# Patient Record
Sex: Male | Born: 1945 | Race: Black or African American | Hispanic: No | Smoking: Current every day smoker
Health system: Southern US, Community
[De-identification: ages and names within clinical notes are randomized; demographics above are authoritative.]

## PROBLEM LIST (undated history)

## (undated) DIAGNOSIS — N3289 Other specified disorders of bladder: Secondary | ICD-10-CM

## (undated) DIAGNOSIS — I639 Cerebral infarction, unspecified: Secondary | ICD-10-CM

## (undated) DIAGNOSIS — I4510 Unspecified right bundle-branch block: Secondary | ICD-10-CM

## (undated) DIAGNOSIS — J449 Chronic obstructive pulmonary disease, unspecified: Secondary | ICD-10-CM

## (undated) DIAGNOSIS — E0520 Thyrotoxicosis with toxic multinodular goiter without thyrotoxic crisis or storm: Secondary | ICD-10-CM

## (undated) DIAGNOSIS — C689 Malignant neoplasm of urinary organ, unspecified: Secondary | ICD-10-CM

## (undated) DIAGNOSIS — I10 Essential (primary) hypertension: Secondary | ICD-10-CM

## (undated) DIAGNOSIS — F0390 Unspecified dementia without behavioral disturbance: Secondary | ICD-10-CM

---

## 2000-07-19 ENCOUNTER — Encounter: Payer: Self-pay | Admitting: Emergency Medicine

## 2000-07-19 ENCOUNTER — Inpatient Hospital Stay (HOSPITAL_COMMUNITY): Admission: EM | Admit: 2000-07-19 | Discharge: 2000-08-05 | Payer: Self-pay | Admitting: Emergency Medicine

## 2000-07-21 ENCOUNTER — Encounter: Payer: Self-pay | Admitting: Orthopedic Surgery

## 2000-10-02 ENCOUNTER — Ambulatory Visit (HOSPITAL_COMMUNITY): Admission: RE | Admit: 2000-10-02 | Discharge: 2000-10-02 | Payer: Self-pay | Admitting: Family Medicine

## 2000-10-02 ENCOUNTER — Encounter: Payer: Self-pay | Admitting: Family Medicine

## 2004-06-06 ENCOUNTER — Ambulatory Visit: Payer: Self-pay | Admitting: Family Medicine

## 2004-06-06 ENCOUNTER — Ambulatory Visit (HOSPITAL_COMMUNITY): Admission: RE | Admit: 2004-06-06 | Discharge: 2004-06-06 | Payer: Self-pay | Admitting: Family Medicine

## 2004-06-13 ENCOUNTER — Ambulatory Visit (HOSPITAL_COMMUNITY): Admission: RE | Admit: 2004-06-13 | Discharge: 2004-06-13 | Payer: Self-pay | Admitting: Family Medicine

## 2004-06-19 ENCOUNTER — Ambulatory Visit (HOSPITAL_COMMUNITY): Admission: RE | Admit: 2004-06-19 | Discharge: 2004-06-19 | Payer: Self-pay | Admitting: Family Medicine

## 2004-09-06 ENCOUNTER — Ambulatory Visit: Payer: Self-pay | Admitting: Family Medicine

## 2004-09-28 ENCOUNTER — Encounter (INDEPENDENT_AMBULATORY_CARE_PROVIDER_SITE_OTHER): Payer: Self-pay | Admitting: General Surgery

## 2004-09-28 ENCOUNTER — Ambulatory Visit (HOSPITAL_COMMUNITY): Admission: RE | Admit: 2004-09-28 | Discharge: 2004-09-28 | Payer: Self-pay | Admitting: General Surgery

## 2004-10-25 ENCOUNTER — Encounter (INDEPENDENT_AMBULATORY_CARE_PROVIDER_SITE_OTHER): Payer: Self-pay | Admitting: General Surgery

## 2004-10-26 ENCOUNTER — Ambulatory Visit (HOSPITAL_COMMUNITY): Admission: RE | Admit: 2004-10-26 | Discharge: 2004-10-26 | Payer: Self-pay | Admitting: General Surgery

## 2004-11-06 ENCOUNTER — Ambulatory Visit: Payer: Self-pay | Admitting: Family Medicine

## 2004-12-13 ENCOUNTER — Ambulatory Visit: Payer: Self-pay | Admitting: Family Medicine

## 2005-04-13 ENCOUNTER — Ambulatory Visit: Payer: Self-pay | Admitting: Family Medicine

## 2005-08-21 ENCOUNTER — Ambulatory Visit: Payer: Self-pay | Admitting: Family Medicine

## 2006-01-14 ENCOUNTER — Ambulatory Visit: Payer: Self-pay | Admitting: Family Medicine

## 2006-05-23 ENCOUNTER — Ambulatory Visit: Payer: Self-pay | Admitting: Family Medicine

## 2006-08-22 ENCOUNTER — Encounter (INDEPENDENT_AMBULATORY_CARE_PROVIDER_SITE_OTHER): Payer: Self-pay | Admitting: *Deleted

## 2006-08-22 ENCOUNTER — Encounter: Payer: Self-pay | Admitting: Family Medicine

## 2006-08-22 LAB — CONVERTED CEMR LAB
BUN: 18 mg/dL (ref 6–23)
Basophils Absolute: 0 10*3/uL (ref 0.0–0.1)
Basophils Relative: 1 % (ref 0–1)
CO2: 24 meq/L (ref 19–32)
Chloride: 99 meq/L (ref 96–112)
Cholesterol: 178 mg/dL (ref 0–200)
Creatinine, Ser: 1.11 mg/dL (ref 0.40–1.50)
Glucose, Bld: 86 mg/dL (ref 70–99)
Monocytes Absolute: 0.7 10*3/uL (ref 0.2–0.7)
RBC: 6.73 M/uL — ABNORMAL HIGH (ref 4.22–5.81)
RDW: 16 % — ABNORMAL HIGH (ref 11.5–14.0)
Triglycerides: 181 mg/dL — ABNORMAL HIGH (ref <150)

## 2006-08-26 ENCOUNTER — Ambulatory Visit: Payer: Self-pay | Admitting: Family Medicine

## 2006-08-27 ENCOUNTER — Encounter: Payer: Self-pay | Admitting: Family Medicine

## 2006-08-30 ENCOUNTER — Ambulatory Visit (HOSPITAL_COMMUNITY): Admission: RE | Admit: 2006-08-30 | Discharge: 2006-08-30 | Payer: Self-pay | Admitting: Pediatrics

## 2006-09-09 ENCOUNTER — Ambulatory Visit: Payer: Self-pay | Admitting: Family Medicine

## 2006-09-16 ENCOUNTER — Ambulatory Visit (HOSPITAL_COMMUNITY): Admission: RE | Admit: 2006-09-16 | Discharge: 2006-09-16 | Payer: Self-pay | Admitting: General Surgery

## 2006-09-16 ENCOUNTER — Ambulatory Visit (HOSPITAL_COMMUNITY): Admission: RE | Admit: 2006-09-16 | Discharge: 2006-09-16 | Payer: Self-pay | Admitting: Family Medicine

## 2006-09-30 ENCOUNTER — Ambulatory Visit (HOSPITAL_COMMUNITY): Admission: RE | Admit: 2006-09-30 | Discharge: 2006-09-30 | Payer: Self-pay | Admitting: Family Medicine

## 2006-10-25 ENCOUNTER — Encounter (INDEPENDENT_AMBULATORY_CARE_PROVIDER_SITE_OTHER): Payer: Self-pay | Admitting: *Deleted

## 2006-10-25 DIAGNOSIS — I10 Essential (primary) hypertension: Secondary | ICD-10-CM | POA: Insufficient documentation

## 2006-10-25 DIAGNOSIS — Z860100 Personal history of colon polyps, unspecified: Secondary | ICD-10-CM | POA: Insufficient documentation

## 2006-10-25 DIAGNOSIS — E049 Nontoxic goiter, unspecified: Secondary | ICD-10-CM | POA: Insufficient documentation

## 2006-10-25 DIAGNOSIS — F17200 Nicotine dependence, unspecified, uncomplicated: Secondary | ICD-10-CM | POA: Insufficient documentation

## 2006-10-25 DIAGNOSIS — Z87898 Personal history of other specified conditions: Secondary | ICD-10-CM

## 2006-10-31 ENCOUNTER — Ambulatory Visit: Payer: Self-pay | Admitting: Family Medicine

## 2007-01-02 ENCOUNTER — Encounter: Payer: Self-pay | Admitting: Family Medicine

## 2007-02-04 ENCOUNTER — Ambulatory Visit: Payer: Self-pay | Admitting: Family Medicine

## 2007-02-05 ENCOUNTER — Encounter: Payer: Self-pay | Admitting: Family Medicine

## 2007-02-05 LAB — CONVERTED CEMR LAB: T3, Free: 3 pg/mL (ref 2.3–4.2)

## 2007-06-04 ENCOUNTER — Ambulatory Visit: Payer: Self-pay | Admitting: Family Medicine

## 2007-06-04 LAB — CONVERTED CEMR LAB
BUN: 27 mg/dL — ABNORMAL HIGH (ref 6–23)
CO2: 22 meq/L (ref 19–32)
Calcium: 9.8 mg/dL (ref 8.4–10.5)
Creatinine, Ser: 1.3 mg/dL (ref 0.40–1.50)
Free T4: 1.11 ng/dL (ref 0.89–1.80)
Glucose, Bld: 82 mg/dL (ref 70–99)
Potassium: 4.2 meq/L (ref 3.5–5.3)
Sodium: 138 meq/L (ref 135–145)
T3, Free: 2.5 pg/mL (ref 2.3–4.2)
TSH: 0.421 microintl units/mL (ref 0.350–5.50)

## 2007-07-23 ENCOUNTER — Ambulatory Visit: Payer: Self-pay | Admitting: Family Medicine

## 2007-09-30 ENCOUNTER — Ambulatory Visit: Payer: Self-pay | Admitting: Family Medicine

## 2008-01-29 ENCOUNTER — Ambulatory Visit: Payer: Self-pay | Admitting: Family Medicine

## 2008-01-30 DIAGNOSIS — F1010 Alcohol abuse, uncomplicated: Secondary | ICD-10-CM | POA: Insufficient documentation

## 2008-04-20 ENCOUNTER — Encounter: Payer: Self-pay | Admitting: Family Medicine

## 2008-04-20 LAB — CONVERTED CEMR LAB
Calcium: 9.4 mg/dL (ref 8.4–10.5)
Cholesterol: 117 mg/dL (ref 0–200)
Eosinophils Absolute: 0 10*3/uL (ref 0.0–0.7)
HDL: 35 mg/dL — ABNORMAL LOW (ref >39)
LDL Cholesterol: 61 mg/dL (ref 0–99)
Monocytes Relative: 9 % (ref 3–12)
Neutro Abs: 3.3 10*3/uL (ref 1.7–7.7)
Neutrophils Relative %: 58 % (ref 43–77)
Platelets: 175 10*3/uL (ref 150–400)
RDW: 16.1 % — ABNORMAL HIGH (ref 11.5–15.5)
TSH: 0.444 microintl units/mL (ref 0.350–4.500)
Total CHOL/HDL Ratio: 3.3
VLDL: 21 mg/dL (ref 0–40)

## 2008-08-14 ENCOUNTER — Emergency Department (HOSPITAL_COMMUNITY): Admission: EM | Admit: 2008-08-14 | Discharge: 2008-08-14 | Payer: Self-pay | Admitting: Emergency Medicine

## 2008-11-08 ENCOUNTER — Ambulatory Visit: Payer: Self-pay | Admitting: Family Medicine

## 2008-12-07 ENCOUNTER — Ambulatory Visit: Payer: Self-pay | Admitting: Family Medicine

## 2008-12-09 ENCOUNTER — Encounter: Payer: Self-pay | Admitting: Family Medicine

## 2008-12-14 ENCOUNTER — Encounter: Payer: Self-pay | Admitting: Family Medicine

## 2009-03-15 ENCOUNTER — Ambulatory Visit: Payer: Self-pay | Admitting: Family Medicine

## 2009-03-15 DIAGNOSIS — R5383 Other fatigue: Secondary | ICD-10-CM

## 2009-03-15 DIAGNOSIS — R5381 Other malaise: Secondary | ICD-10-CM

## 2009-03-15 LAB — CONVERTED CEMR LAB
Cholesterol, target level: 200 mg/dL
LDL Goal: 100 mg/dL

## 2009-03-21 ENCOUNTER — Emergency Department (HOSPITAL_COMMUNITY): Admission: EM | Admit: 2009-03-21 | Discharge: 2009-03-21 | Payer: Self-pay | Admitting: Emergency Medicine

## 2009-03-23 ENCOUNTER — Ambulatory Visit: Payer: Self-pay | Admitting: Family Medicine

## 2009-03-23 DIAGNOSIS — L02519 Cutaneous abscess of unspecified hand: Secondary | ICD-10-CM

## 2009-03-23 DIAGNOSIS — L03119 Cellulitis of unspecified part of limb: Secondary | ICD-10-CM

## 2009-03-27 DIAGNOSIS — R413 Other amnesia: Secondary | ICD-10-CM

## 2009-05-23 ENCOUNTER — Ambulatory Visit: Payer: Self-pay | Admitting: Family Medicine

## 2009-05-23 LAB — CONVERTED CEMR LAB
Basophils Absolute: 0 10*3/uL (ref 0.0–0.1)
Chloride: 103 meq/L (ref 96–112)
Cholesterol: 151 mg/dL (ref 0–200)
Creatinine, Ser: 1.04 mg/dL (ref 0.40–1.50)
Eosinophils Absolute: 0.1 10*3/uL (ref 0.0–0.7)
Glucose, Bld: 118 mg/dL — ABNORMAL HIGH (ref 70–99)
Lymphocytes Relative: 43 % (ref 12–46)
Lymphs Abs: 1.8 10*3/uL (ref 0.7–4.0)
Monocytes Absolute: 0.4 10*3/uL (ref 0.1–1.0)
Monocytes Relative: 9 % (ref 3–12)
Neutrophils Relative %: 47 % (ref 43–77)
OCCULT 1: NEGATIVE
PSA: 2.03 ng/mL (ref 0.10–4.00)
Potassium: 4.1 meq/L (ref 3.5–5.3)
RBC: 6.77 M/uL — ABNORMAL HIGH (ref 4.22–5.81)
Sodium: 139 meq/L (ref 135–145)
Triglycerides: 80 mg/dL (ref <150)

## 2009-05-26 ENCOUNTER — Ambulatory Visit (HOSPITAL_COMMUNITY): Admission: RE | Admit: 2009-05-26 | Discharge: 2009-05-26 | Payer: Self-pay | Admitting: Family Medicine

## 2009-06-17 ENCOUNTER — Encounter: Payer: Self-pay | Admitting: Family Medicine

## 2009-08-05 ENCOUNTER — Encounter: Admission: RE | Admit: 2009-08-05 | Discharge: 2009-08-05 | Payer: Self-pay | Admitting: Family Medicine

## 2010-01-31 NOTE — Letter (Signed)
Summary: medical release  medical release   Imported By: Lind Guest 06/24/2009 13:54:01  _____________________________________________________________________  External Attachment:    Type:   Image     Comment:   External Document

## 2010-01-31 NOTE — Letter (Signed)
Summary: X  RAYS  X  RAYS   Imported By: Lind Guest 06/21/2009 14:15:12  _____________________________________________________________________  External Attachment:    Type:   Image     Comment:   External Document

## 2010-01-31 NOTE — Letter (Signed)
Summary: DEMO  DEMO   Imported By: Lind Guest 06/21/2009 14:19:37  _____________________________________________________________________  External Attachment:    Type:   Image     Comment:   External Document

## 2010-01-31 NOTE — Letter (Signed)
Summary: PHONE NOTES  PHONE NOTES   Imported By: Lind Guest 06/21/2009 14:13:23  _____________________________________________________________________  External Attachment:    Type:   Image     Comment:   External Document

## 2010-01-31 NOTE — Letter (Signed)
Summary: LABS  LABS   Imported By: Lind Guest 06/21/2009 14:14:08  _____________________________________________________________________  External Attachment:    Type:   Image     Comment:   External Document

## 2010-01-31 NOTE — Letter (Signed)
Summary: MISC  MISC   Imported By: Lind Guest 06/21/2009 14:12:14  _____________________________________________________________________  External Attachment:    Type:   Image     Comment:   External Document

## 2010-01-31 NOTE — Assessment & Plan Note (Signed)
Summary: er follow up- room 1   Vital Signs:  Patient profile:   65 year old male Height:      71 inches Weight:      166.50 pounds BMI:     23.31 O2 Sat:      95 % on Room air Pulse rate:   71 / minute Resp:     16 per minute BP sitting:   126 / 80  (left arm)  Vitals Entered By: Adella Hare LPN (March 23, 2009 1:20 PM) CC: er follow up, osteomylitis Is Patient Diabetic? No Pain Assessment Patient in pain? no        CC:  er follow up and osteomylitis.  History of Present Illness: Pt is here today with his wife for a check up. He was in the ER Mon 3/21 for an infection in his Lt 5th finger.  Question of osteomyelitis.  Had Consult in ER with Dr Hilda Lias who didn't think that it was.  Was advised to have a f/u with Dr Hilda Lias next wk but his wife brought him here today.  Hasn't sched appt with Dr Hilda Lias yet. Per wife swelling is much better.  Monday his entire finger was swollen. No fever or chills.  Pt is also overdue for f/u of his chronic med probs. Wife states she has a lab slip/order to get his blood drawn.  Will do this next mos. No refills needed on meds at this time Is still drinking & smoking.   Current Medications (verified): 1)  Benazepril Hcl 10 Mg Tabs (Benazepril Hcl) .... Take 1 Tablet By Mouth Once A Day  Allergies (verified): No Known Drug Allergies  Past History:  Past medical, surgical, family and social histories (including risk factors) reviewed for relevance to current acute and chronic problems.  Past Medical History: Reviewed history from 12/10/2006 and no changes required. Current Problems:  ? of THYROID NODULE (ICD-241.0) GOITER (ICD-240.9) COLONIC POLYPS, HX OF (ICD-V12.72) BENIGN PROSTATIC HYPERTROPHY, HX OF (ICD-V13.8) NICOTINE ADDICTION (ICD-305.1) HYPERTENSION (ICD-401.9) ALCOHOL DEPENDENCE     Past Surgical History: Reviewed history from 12/10/2006 and no changes required. Denies surgical history  Family  History: Reviewed history from 12/10/2006 and no changes required. NO CHILDREN MOTHER DECEASED HEART ATTACK FATHER LIVING  HYPERTENSIVE ONE SISTER LIVING  HYPERTENSIVE THREE BROTHERS  DECEASED CAUSE UNKNOWN THREE BROTHERS LIVING  HEALTH UNKNOWN  Social History: Reviewed history from 12/10/2006 and no changes required. DISABLED Single Current Smoker Alcohol use-yes Drug use-no  Review of Systems General:  Denies chills and fever. CV:  Denies chest pain or discomfort and palpitations. Resp:  Denies cough and shortness of breath. GI:  Denies abdominal pain and change in bowel habits.  Physical Exam  General:  Well-developed,well-nourished,in no acute distress; alert,appropriate and cooperative throughout examination Head:  Normocephalic and atraumatic without obvious abnormalities. No apparent alopecia or balding. Ears:  External ear exam shows no significant lesions or deformities.  Otoscopic examination reveals clear canals, tympanic membranes are intact bilaterally without bulging, retraction, inflammation or discharge. Hearing is grossly normal bilaterally. Nose:  External nasal examination shows no deformity or inflammation. Nasal mucosa are pink and moist without lesions or exudates. Mouth:  Oral mucosa and oropharynx without lesions or exudates.  Teeth in good repair. Neck:  No deformities, masses, or tenderness noted. Lungs:  Normal respiratory effort, chest expands symmetrically. Lungs are clear to auscultation, no crackles or wheezes. Heart:  Normal rate and regular rhythm. S1 and S2 normal without gallop, murmur, click, rub or other  extra sounds. Msk:  L5th digit, swelling of distal phalanx.  No erythema Pulses:  L radial normal.  L radial normal.     Impression & Recommendations:  Problem # 1:  CELLULITIS, HAND, LEFT (ICD-682.4) Assessment Improved Continue current meds. Will sched appt for pt with Dr Hilda Lias, per ER dischg instructions. Orders: Orthopedic  Referral (Ortho)  Problem # 2:  HYPERTENSION (ICD-401.9) Assessment: Unchanged  His updated medication list for this problem includes:    Benazepril Hcl 10 Mg Tabs (Benazepril hcl) .Marland Kitchen... Take 1 tablet by mouth once a day  BP today: 126/80 Prior BP: 133/88 (11/08/2008)  Labs Reviewed: K+: 4.1 (04/20/2008) Creat: : 1.08 (04/20/2008)   Chol: 117 (04/20/2008)   HDL: 35 (04/20/2008)   LDL: 61 (04/20/2008)   TG: 107 (04/20/2008)  His updated medication list for this problem includes:    Benazepril Hcl 10 Mg Tabs (Benazepril hcl) .Marland Kitchen... Take 1 tablet by mouth once a day  Problem # 3:  ALCOHOL ABUSE (ICD-305.00) Assessment: Unchanged  Problem # 4:  NICOTINE ADDICTION (ICD-305.1) Assessment: Unchanged  Complete Medication List: 1)  Benazepril Hcl 10 Mg Tabs (Benazepril hcl) .... Take 1 tablet by mouth once a day  Patient Instructions: 1)  Please schedule a follow-up appointment in 3 months. 2)  Tobacco is very bad for your health and your loved ones! You Should stop smoking!. 3)  Stop Smoking Tips: Choose a Quit date. Cut down before the Quit date. decide what you will do as a substitute when you feel the urge to smoke(gum,toothpick,exercise). 4)  It is not healthy  for men to drink more than 2-3 drinks per day or for women to drink more than 1-2 drinks per day. 5)  We will schedule a follow up appt with Dr Hilda Lias next week for you.

## 2010-01-31 NOTE — Letter (Signed)
Summary: HISTORY AND PHYSICAL  HISTORY AND PHYSICAL   Imported By: Lind Guest 06/21/2009 14:17:06  _____________________________________________________________________  External Attachment:    Type:   Image     Comment:   External Document

## 2010-01-31 NOTE — Assessment & Plan Note (Signed)
Summary: FOLLOW UP   Vital Signs:  Patient profile:   65 year old male Height:      71 inches Weight:      163.25 pounds BMI:     22.85 O2 Sat:      97 % Pulse rate:   70 / minute Pulse rhythm:   regular Resp:     16 per minute BP sitting:   142 / 92  (left arm) Cuff size:   regular  Vitals Entered By: Everitt Amber LPN (March 15, 2009 11:08 AM) CC: Follow up chronic problems, Lipid Management   CC:  Follow up chronic problems and Lipid Management.  History of Present Illness: Reports  that he has been doing fairly well. He continues to drink alcohol, regularly, and allegedly also uses marijuana on a regular basis. He had recently gone to an assisted living facility, but hasreturned to live with his sister, who states she is unable to manage him and would appreciate help. The pt is anextremely poor historian, and perfformed extremely poorly on the minicog test, score of zero. Alcohol abuse is the most likely contributor to this. Denies recent fever or chills. Denies sinus pressure, nasal congestion , ear pain or sore throat. Denies chest congestion, or cough productive of sputum. Denies chest pain, palpitations, PND, orthopnea or leg swelling. Denies abdominal pain, nausea, vomitting, diarrhea or constipation.  Denies  joint pain, swelling, or reduced mobility. Denies headaches, vertigo, seizures. Denies depression, anxiety or insomnia. Denies  rash, lesions, or itch.     Lipid Management History:      Positive NCEP/ATP III risk factors include male age 8 years old or older, HDL cholesterol less than 40, current tobacco user, and hypertension.    Preventive Screening-Counseling & Management  Alcohol-Tobacco     Smoking Cessation Counseling: yes  Current Medications (verified): 1)  None  Allergies (verified): No Known Drug Allergies  Past History:  Past medical, surgical, family and social histories (including risk factors) reviewed, and no changes noted (except  as noted below).  Past Medical History: Current Problems:  ? of THYROID NODULE (ICD-241.0) GOITER (ICD-240.9) COLONIC POLYPS, HX OF (ICD-V12.72) BENIGN PROSTATIC HYPERTROPHY, HX OF (ICD-V13.8) NICOTINE ADDICTION (ICD-305.1) HYPERTENSION (ICD-401.9) ALCOHOL DEPENDENCE memory disorder     Past Surgical History: Reviewed history from 12/10/2006 and no changes required. Denies surgical history  Family History: Reviewed history from 12/10/2006 and no changes required. NO CHILDREN MOTHER DECEASED HEART ATTACK FATHERdeceased at at age 42 ONE SISTER LIVING  HYPERTENSIVE THREE BROTHERS  DECEASED CAUSE UNKNOWN two BROTHERS LIVING  HEALTH UNKNOWN  Social History: Reviewed history from 12/10/2006 and no changes required. DISABLED Single Current Smoker Alcohol use-yes Drug use-no  Review of Systems      See HPI Eyes:  Denies blurring and discharge. Endo:  Denies excessive thirst and excessive urination. Heme:  Denies abnormal bruising and bleeding. Allergy:  Complains of seasonal allergies.  Physical Exam  General:  Underweight appearing,in no acute distress; alert,appropriate and cooperative throughout examination HEENT: No facial asymmetry,  EOMI, No sinus tenderness, TM's Clear, oropharynx  pink and moist. Poor dentition.  Chest: Clear to auscultation bilaterally. decreased air entry throughout CVS: S1, S2, No murmurs, No S3.   Abd: Soft, Nontender.  MS: Adequate ROM spine, hips, shoulders and knees.  Ext: No edema.   CNS: CN 2-12 intact, power tone and sensation normal throughout.   Skin: Intact, no visible lesions or rashes.  Psych: Good eye contact, normal affect.  Memory loss, not anxious  or depressed appearing.    Impression & Recommendations:  Problem # 1:  ALCOHOL ABUSE (ICD-305.00) Assessment Unchanged  Problem # 2:  NICOTINE ADDICTION (ICD-305.1) Assessment: Unchanged  Encouraged smoking cessation and discussed different methods for smoking cessation.    Problem # 3:  HYPERTENSION (ICD-401.9) Assessment: Deteriorated  The following medications were removed from the medication list:    Hydrochlorothiazide 25 Mg Tabs (Hydrochlorothiazide) .Marland Kitchen... Take 1 tablet by mouth once a day His updated medication list for this problem includes:    Benazepril Hcl 10 Mg Tabs (Benazepril hcl) .Marland Kitchen... Take 1 tablet by mouth once a day  Orders: T-Basic Metabolic Panel [phone])  BP today: 142/92 Prior BP: 133/88 (11/08/2008)  Labs Reviewed: K+: 4.1 (04/20/2008) Creat: : 1.08 (04/20/2008)   Chol: 117 (04/20/2008)   HDL: 35 (04/20/2008)   LDL: 61 (04/20/2008)   TG: 107 (04/20/2008)  Problem # 4:  MEMORY LOSS (ICD-780.93) Assessment: Comment Only ill refer for caregiver assistance through state program  Complete Medication List: 1)  Benazepril Hcl 10 Mg Tabs (Benazepril hcl) .... Take 1 tablet by mouth once a day  Other Orders: T-Lipid Profile ([id number]) T-TSH [phone]) T-CBC w/Diff [id number]) T-PSA [phone]) Pneumococcal Vaccine (02725) Admin 1st Vaccine (36644) Admin 1st Vaccine [facility]) [phone])  Lipid Assessment/Plan:      Based on NCEP/ATP III, the patient's risk factor category is "2 or more risk factors and a calculated 10 year CAD risk of > 20%".  The patient's lipid goals are as follows: Total cholesterol goal is 200; LDL cholesterol goal is 100; HDL cholesterol goal is 40; Triglyceride goal is 150.     Patient Instructions: 1)  F/U mid May. 2)  Tobacco is very bad for your health and your loved ones! You Should stop smoking!. 3)  Stop Smoking Tips: Choose a Quit date. Cut down before the Quit date. decide what you will do as a substitute when you feel the urge to smoke(gum,toothpick,exercise). 4)  It is not healthy  for men to drink more than 2-3 drinks per day or for women to drink more than 1-2 drinks per day. 5)  BMP prior to visit, ICD-9: 6)  Lipid Panel prior to visit, ICD-9: 7)  TSH prior to visit, ICD-9:   fasting  end APRIL 8)  CBC w/ Diff prior to visit, ICD-9: 9)  PSA prior to visit, ICD-9: 10)  You need to start blood pressure meds again Prescriptions: BENAZEPRIL HCL 10 MG TABS (BENAZEPRIL HCL) Take 1 tablet by mouth once a day  #30 x 2   Entered and Authorized by:   Syliva Overman MD   Signed by:   Syliva Overman MD on 03/15/2009   Method used:   [facility] to        [facility] [phone]* (retail)       [address]       [place], [place]  [address]       Ph: [phone] or [phone]       Fax: [phone]   RxID:   [id number]     Pneumovax    Vaccine Type: Pneumovax    Site: left deltoid    Mfr: Merck    Dose: 0.5 ml    Route: IM    Given by: Everitt Amber LPN    Exp. Date: 12/11    Lot #: [id number]

## 2010-01-31 NOTE — Assessment & Plan Note (Signed)
Summary: office visit   Vital Signs:  Patient profile:   65 year old male Height:      71 inches Weight:      160.25 pounds BMI:     22.43 O2 Sat:      97 % Pulse rate:   78 / minute Pulse rhythm:   regular Resp:     16 per minute BP sitting:   130 / 84  (left arm) Cuff size:   regular  Vitals Entered By: Everitt Amber LPN (May 23, 2009 2:43 PM) CC: Follow up chronic problems   CC:  Follow up chronic problems.  History of Present Illness: Smokes 1 pack ciggs per week, states he does not use pot, reports less alcohol use. The sister who alleges differently averts her head and eyes and is silent despite promopting to verify his history, which I seriously doubt. Apart from inc allergy symptoms of itchy watery eyes, he state he feels well and has no concerns.   Preventive Screening-Counseling & Management  Alcohol-Tobacco     Smoking Cessation Counseling: yes  Current Medications (verified): 1)  Benazepril Hcl 10 Mg Tabs (Benazepril Hcl) .... Take 1 Tablet By Mouth Once A Day  Allergies (verified): No Known Drug Allergies  Review of Systems      See HPI General:  Complains of fatigue; denies chills and fever. Eyes:  Complains of itching; denies discharge and eye pain. ENT:  Complains of postnasal drainage; denies hoarseness, nasal congestion, sinus pressure, and sore throat. CV:  Denies chest pain or discomfort, difficulty breathing while lying down, lightheadness, palpitations, and swelling of feet. Resp:  Complains of cough and shortness of breath; denies sputum productive and wheezing; chronic smokers cough , unchanged. GI:  Denies abdominal pain, constipation, diarrhea, nausea, and vomiting. GU:  Complains of erectile dysfunction; denies dysuria and urinary frequency. MS:  Denies joint pain, low back pain, mid back pain, muscle weakness, and stiffness. Derm:  Denies itching and rash. Neuro:  Complains of difficulty with concentration and memory loss; denies headaches,  seizures, and sensation of room spinning. Psych:  Denies anxiety and depression. Endo:  Denies cold intolerance, excessive hunger, excessive thirst, excessive urination, heat intolerance, polyuria, and weight change. Heme:  Denies abnormal bruising and bleeding. Allergy:  Complains of itching eyes and seasonal allergies; increased symptoms x 1 month.  Physical Exam  General:  Well-developed,well-nourished,in no acute distress; alert,appropriate and cooperative throughout examination HEENT: No facial asymmetry, goiter. EOMI, No sinus tenderness, TM's Clear, oropharynx  pink and moist.   Chest: Clear to auscultation bilaterally. Decreased air entry throughout. CVS: S1, S2, No murmurs, No S3.   Abd: Soft, Nontender.  MS: Adequate ROM spine, hips, shoulders and knees.  Ext: No edema.   CNS: CN 2-12 intact, power tone and sensation normal throughout.   Skin: Intact, no visible lesions or rashes.  Psych: Good eye contact, normal affect.  Memory loss not anxious or depressed appearing.    Impression & Recommendations:  Problem # 1:  MEMORY LOSS (ICD-780.93) Assessment Unchanged  Problem # 2:  GOITER (ICD-240.9) Assessment: Comment Only  Orders: Radiology Referral (Radiology)  Problem # 3:  HYPERTENSION (ICD-401.9) Assessment: Unchanged  His updated medication list for this problem includes:    Benazepril Hcl 10 Mg Tabs (Benazepril hcl) .Marland Kitchen... Take 1 tablet by mouth once a day  BP today: 130/84 Prior BP: 126/80 (03/23/2009)  Prior 10 Yr Risk Heart Disease: 22 % (03/15/2009)  Labs Reviewed: K+: 4.1 (05/20/2009) Creat: : 1.04 (05/20/2009)  Chol: 151 (05/20/2009)   HDL: 43 (05/20/2009)   LDL: 92 (05/20/2009)   TG: 80 (05/20/2009)  Problem # 4:  NICOTINE ADDICTION (ICD-305.1) Assessment: Unchanged  Encouraged smoking cessation and discussed different methods for smoking cessation.   Problem # 5:  ALCOHOL ABUSE (ICD-305.00) Assessment: Unchanged  Complete Medication  List: 1)  Benazepril Hcl 10 Mg Tabs (Benazepril hcl) .... Take 1 tablet by mouth once a day 2)  Cromolyn Sodium 4 % Soln (Cromolyn sodium) .... One drop to each eye 3 times daily as needed  Other Orders: Hemoccult Guaiac-1 spec.(in office) ([address])  Patient Instructions: 1)  Please schedule a follow-up appointment in 3 months. 2)  Tobacco is very bad for your health and your loved ones! You Should stop smoking!. 3)  Stop Smoking Tips: Choose a Quit date. Cut down before the Quit date. decide what you will do as a substitute when you feel the urge to smoke(gum,toothpick,exercise). 4)  It is not healthy  for men to drink more than 2-3 drinks per day or for women to drink more than 1-2 drinks per day. 5)  You are being referred for tests on your thyroid gland. 6)  New med is sent in for your itchy watery eyes. Prescriptions: BENAZEPRIL HCL 10 MG TABS (BENAZEPRIL HCL) Take 1 tablet by mouth once a day  #30 x 5   Entered by:   Everitt Amber LPN   Authorized by:   Syliva Overman MD   Signed by:   Everitt Amber LPN on 60/45/4098   Method used:   [facility] to        [facility] [phone]* (retail)       [address]       [place], [place]  [address]       Ph: [phone] or [phone]       Fax: [phone]   RxID:   [id number] CROMOLYN SODIUM 4 % SOLN (CROMOLYN SODIUM) one drop to each eye 3 times daily as needed  #7.80ml x 1   Entered and Authorized by:   Syliva Overman MD   Signed by:   Syliva Overman MD on 05/23/2009   Method used:   [facility]ically to        [facility] [phone]* (retail)       [address]       [place], [place]  [address]       Ph: [phone] or [phone]       Fax: [phone]   RxID:   [id number]   Laboratory Results    Stool - Occult Blood Hemmoccult #1: negative Date: 05/23/2009 Comments: [id number] 9R 8/11 118 10/12

## 2010-01-31 NOTE — Letter (Signed)
Summary: CONSULTS  CONSULTS   Imported By: Lind Guest 06/21/2009 14:13:46  _____________________________________________________________________  External Attachment:    Type:   Image     Comment:   External Document

## 2010-01-31 NOTE — Letter (Signed)
Summary: OFFICE NOTES  OFFICE NOTES   Imported By: Lind Guest 06/21/2009 14:15:45  _____________________________________________________________________  External Attachment:    Type:   Image     Comment:   External Document

## 2010-03-27 LAB — CBC
HCT: 47.5 % (ref 39.0–52.0)
MCHC: 33 g/dL (ref 30.0–36.0)
RBC: 6.38 MIL/uL — ABNORMAL HIGH (ref 4.22–5.81)
WBC: 5.7 10*3/uL (ref 4.0–10.5)

## 2010-03-27 LAB — BASIC METABOLIC PANEL
BUN: 11 mg/dL (ref 6–23)
CO2: 27 mEq/L (ref 19–32)
Calcium: 8.6 mg/dL (ref 8.4–10.5)
Creatinine, Ser: 1.05 mg/dL (ref 0.4–1.5)
Potassium: 3.7 mEq/L (ref 3.5–5.1)
Sodium: 139 mEq/L (ref 135–145)

## 2010-03-27 LAB — DIFFERENTIAL
Basophils Relative: 1 % (ref 0–1)
Eosinophils Relative: 1 % (ref 0–5)
Monocytes Absolute: 0.4 10*3/uL (ref 0.1–1.0)
Neutrophils Relative %: 67 % (ref 43–77)

## 2010-05-16 NOTE — H&P (Signed)
NAME:  [name], [name]              ACCOUNT NO.:  [id number]   MEDICAL RECORD NO.:  [id number]          PATIENT TYPE:  AMB   LOCATION:  DAY                           FACILITY:  [facility]   PHYSICIAN:  Dalia Heading, M.D.  DATE OF BIRTH:  1945/05/27   DATE OF ADMISSION:  DATE OF DISCHARGE:  LH                              HISTORY & PHYSICAL   PREADMISSION HISTORY AND PHYSICAL   PATIENT NAME:  Jason [name].   DATE OF BIRTH:  Nov 24, 1945.   CHIEF COMPLAINT:  Colon polyps.   HISTORY OF PRESENT ILLNESS:  The patient is a 65 year old, black male,  who is referred for endoscopic evaluation.  He needs a colonoscopy for  followup of colon polyps that were removed by Dr. Katrinka Blazing in the past.  No  abdominal pain, weight loss, nausea, vomiting, diarrhea, constipation,  melena, or hematochezia have been noted.  There is no family history of  colon carcinoma.   PAST MEDICAL HISTORY:  Hypertension.   PAST SURGICAL HISTORY:  Unremarkable.   CURRENT MEDICATIONS:  Not known.   ALLERGIES:  No known drug allergies.   REVIEW OF SYSTEMS:  The patient does smoke tobacco and drinks alcohol  socially.  He denies any other cardiopulmonary difficulties or bleeding  disorders.   PHYSICAL EXAMINATION:  GENERAL:  The patient is a well-developed, well-  nourished, black male in no acute distress.  LUNGS:  Clear to auscultation with equal breath sounds bilaterally.  HEART:  Regular rate and rhythm without S3, S4, or murmurs.  ABDOMEN:  Soft, nontender, nondistended.  No hepatosplenomegaly or  masses are noted.  RECTAL:  Examination was deferred to the procedure.   IMPRESSION:  Colon polyps.   PLAN:  The patient is scheduled for a colonoscopy on September 16, 2006.  The risks and benefits of the procedure including bleeding and  perforation were fully explained to the patient, who gave informed  consent.      Dalia Heading, M.D.  Electronically Signed     MAJ/MEDQ  D:  09/12/2006  T:   09/12/2006  Job:  [id number]   cc:   Milus Mallick. Lodema Hong, M.D.  Fax: [phone]

## 2010-05-19 NOTE — H&P (Signed)
NAMESALEEM, [name]                 ACCOUNT NO.:  [id number]   MEDICAL RECORD NO.:  [id number]          PATIENT TYPE:  AMB   LOCATION:  DAY                           FACILITY:  [facility]   PHYSICIAN:  Jerolyn Shin C. Katrinka [name], M.D.   DATE OF BIRTH:  [birth year]   DATE OF ADMISSION:  DATE OF DISCHARGE:  LH                                HISTORY & PHYSICAL   78A 65 year old male who had a screening colonoscopy on September 20, 2004.  At that time, the patient was found to have multiple polyps.  Three of the  polyps were removed by snare and two polyps were not removed.  He had a  large polyp at 70-cm where the stalk was so large that I was unsure if the  polyp was entirely removed.  It was felt that the patient would need to have  the polyps removed or re-excision of the stalk.  He is having repeat  colonoscopy with a polypectomy and evaluation of the polypectomy site at  about 70-cm in the descending colon.   PAST HISTORY:  1.  Hypertension.  2.  Prior history of a stroke.   MEDICATIONS:  1.  Altace 5 mg once a day.  2.  Hydrochlorothiazide 12.5 mg once a day.   PHYSICAL EXAMINATION:  VITAL SIGNS:  Blood pressure 140/80, pulse 60,  respirations 20, weight 158 pounds.  HEENT:  Unremarkable.  NECK:  Supple without JVD, bruit, adenopathy, or thyromegaly.  CHEST:  Clear to auscultation.  HEART:  Regular rate and rhythm without murmur, gallop, or rub.  ABDOMEN:  Soft, nontender.  No masses.  EXTREMITIES:  No cyanosis, clubbing, or edema.  NEUROLOGIC:  No focal motor, sensory, or cerebellar deficit.   IMPRESSION:  1.  Colon polyps with need for repeat colonoscopy.  2.  Hypertension.  3.  Stroke.   PLAN:  Repeat colonoscopy and polypectomy.      Jason [name], M.D.  Electronically Signed     LCS/MEDQ  D:  10/25/2004  T:  10/25/2004  Job:  [id number]

## 2010-05-19 NOTE — Discharge Summary (Signed)
[place]. [facility]  Patient:    Jason [name], Jason [name] Visit Number: [id number] MRN: [id number]          Service Type: MED Location: 3000 [id number] Attending Physician:  Phifer, Trinna Post Dictated by:   Lendell Caprice, M.D. Adm. Date:  07/19/2000 Disc. Date: 08/05/2000   CC:         Kelli Hope, M.D.  Syliva Overman, M.D., [place], [place]   Discharge Summary  DISCHARGE DIAGNOSES: 1. Mental status change, current hospitalization. 2. Acute left brain cerebrovascular accident, current hospitalization. 3. 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 4. Hypertension. 5. Alcohol substance abuse. 6. Hypokalemia, current hospitalization. 7. Anemia, current hospitalization. 8. Hyperglycemia, current hospitalization.  DISCHARGE MEDICATIONS: 1. Hydrochlorothiazide 25 mg 1 p.o. q.d. 2. Aspirin 325 mg 1 p.o. q.d.  FOLLOWUP:  The patient will be seen by his new primary care physician, Dr. Syliva Overman, in [place], [place], on August 12, 2000 at 2:30 p.m.  PROCEDURES/DIAGNOSTIC STUDIES: 1. July 19, 2000, CT of the head without contrast.  Impression was: 1) No    acute intracranial abnormalities.  2) Old left parietal stroke and old    lacunar stroke in the right basal ganglia.  3) Severe diffuse atrophy and    small-vessel disease for age. 2. July 19, 2000, chest x-ray.  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 3. July 21, 2000, MRI/MRA of the head with and without contrast:  Impression    was acute ischemia in the distal left anterior cerebral artery distribution    effect in the left high medial parietal lobe.  Also a small focus of acute    ischemia in the central left cerebellar hemisphere, old small vessel    changes seen in the pons, cerebellum, and both hemispheres.  Old left    parietal cortical stroke and right basal ganglia region stroke which have    progressed to  encephalomalacia and atrophy.  Impression:  Intracranial    atherosclerotic changes affecting the medium-sized vessels of the anterior,    middle and posterior cerebral artery distribution.  No major vessel    occlusion demonstrated proximally.  The area of infarction involving the    distal left anterior cerebral artery territory is beyond the region we    could evaluate with MRA.  I am not sure there is antegrade flow in the    right vertebral artery.  Although, we see the distal right vertebral artery    as a patent vessel, this could be due to retrograde flow.  The basilar    artery appears normal. 4. Transthoracic echocardiogram done on July 24, 2000.  Summary:  1) Overall    left ventricular systolic function was normal, left ventricular ejection    fraction with estimated range being 55-65%.  There were no left ventricular    regional wall motion abnormalities. 5. Doppler evaluation, no evidence of significant ICA stenosis, vertebral    artery flow is antegrade.  CONSULTS:  Dr. Kelli Hope, neurology.  HISTORY OF PRESENT ILLNESS:  The patient is a 65 year old African-American male who is right-handed.  He presented to the emergency room with progressive confusion and altered mental status x 3 days.  According to his family, he was found incontinent of urine, shaking in his underwear on the porch.  The patient denies headaches, fevers, chills, chest pain, shortness of breath, dizziness, seizures, history of drug abuse and weakness.  Per the family, the patient is  a significant alcohol drinker and recently had consumed alcohol.  ADMISSION LABORATORY DATA:  Vitamin B-12 548, TSH normal at 0.806, troponin 0.02.  White blood count 6.2, hemoglobin 16.2, MCV 73.9, RDW 14.9, platelets 207.  Sodium 140, potassium 3.6, chloride 107, CO2 28, glucose 173, BUN 21, creatinine 1.1, total bili 0.5.  Alcohol level less than 10.  PTT 28, INR 1.0, PT 12.9.  Urinalysis negative for leukocytes,  negative for nitrites, negative for blood, negative for glucose.  Urine drug screen was negative. Acetaminophen level 1.9, salicylate level less than 4, magnesium 2.1, ammonia 13.  HOSPITAL COURSE: #1 - MENTAL STATUS CHANGES:  The patient was admitted and the reversible and irreversible causes of dementia and mental status change were evaluated.  The patient obtained an MRI/MRA, which showed an acute left brain stroke.  The patient has a history of a right-sided CVA in March 1993 which showed a basal ganglia bleed and lacunar infarcts.  The following labs and toxicologies were all found to be unremarkable:  Cardiac enzymes, RPR, TSH, B-12, ammonia, LFTs, renal function, urine drug screen, serum toxicology, urinalysis, serum cryptococcal antigen, RPR, and ammonia.  The patient was treated with Lovenox 1 mg/kg q.12h.  He was also started on aspirin.  During his admission, occupational therapy and physical therapy worked with the patient.  He was also consulted by speech pathology.  Carotid Dopplers and transthoracic echo were also obtained during this admission.  The patient was discharged on August 05, 2000, status post acute stroke.  A neurology consult was obtained during this admission.  According to their analysis, the patient is suffering from multi-infarct dementia versus alcohol-related dementia, in addition to his acute left brain stroke.  #2 - HYPERTENSION:  The patient was started on HCTZ 25 mg p.o. q.d.  His blood pressures improved during his stay and he was discharged on this medication.  #3 - AlCOHOL ABUSE:  The patient was admitted and immediately started on thiamine and folate.  His alcohol level was less than 10 on admission.  The patient had no evidence of seizures or withdrawal during his stay.  #4 - HYPOKALEMIA:  The patients potassium was 3.3 on July 20, 2000.  The patient was repleted with p.o. K-Dur and his potassium remained normal throughout the rest of his  hospital stay.   #5 - ANEMIA:  The patients anemia was worked up and the etiology was determined to be related to thalassemia.  The iron studies were all normal.  #6 - HYPERGLYCEMIA:  The patients blood sugars were mildly elevated during his hospital admission and on the day of discharge, his glucose was 121.  The patient needs a hemoglobin A1c checked after discharge by Dr. Lodema Hong in outpatient followup.  The patient had no known history or diagnosis of diabetes mellitus.  DISPOSITION:  The patient was discharged to live with his sister in [place], [place].  The patient needs 24-hour supervision at all times.  This can be provided by the family and the patient was discharged to his sisters house in [place], [place].  DISCHARGE LABORATORY DATA:  Sodium 139, potassium 4.1, chloride 98, CO2 31, glucose 121, BUN 20, creatinine 1.3, calcium 9.9.  White blood count 5.2, hemoglobin 15.1, hematocrit 46, MCV 72.7, RDW 14.1, platelets 252. Dictated by:   Lendell Caprice, M.D. Attending Physician:  Phifer, Trinna Post DD:  08/27/00 TD:  08/27/00 Job: [id number]

## 2010-05-19 NOTE — H&P (Signed)
Jason [name], ONTIVEROS                 ACCOUNT NO.:  [id number]   MEDICAL RECORD NO.:  [id number]          PATIENT TYPE:  AMB   LOCATION:  DAY                           FACILITY:  [facility]   PHYSICIAN:  Jerolyn Shin C. Katrinka Blazing, M.D.   DATE OF BIRTH:  1945-09-02   DATE OF ADMISSION:  DATE OF DISCHARGE:  LH                                HISTORY & PHYSICAL   HISTORY OF PRESENT ILLNESS:  A 65 year old male referred for colonoscopy.  This is a screening procedure.  He has no difficulty with bowel movements.  There is no history of rectal bleeding.  No family history of colon cancer.   PAST HISTORY:  1.  He has hypertension.  2.  History of stroke in 2002.   MEDICATIONS:  1.  Altace 5 mg daily.  2.  Hydrochlorothiazide 12.5 mg daily.   PHYSICAL EXAMINATION:  VITAL SIGNS:  Blood pressure 150/78, pulse 68,  respirations 20.  Weight 156 pounds.  HEENT:  Unremarkable.  NECK:  Supple.  No JVD, bruit, adenopathy, or thyromegaly.  CHEST:  Clear to auscultation.  HEART:  Regular rate and rhythm without murmur, gallop, or rub.  ABDOMEN:  Soft, nontender.  No masses.  EXTREMITIES:  No clubbing, cyanosis, or edema.  NEUROLOGIC:  No focal motor, sensory, or cerebellar deficit.   IMPRESSION:  1.  Need for screening colonoscopy.  2.  Hypertension.  3.  History of cerebrovascular accident.   PLAN:  Screening colonoscopy.      Dirk Dress. Katrinka Blazing, M.D.  Electronically Signed    LCS/MEDQ  D:  09/27/2004  T:  09/27/2004  Job:  [id number]

## 2010-05-28 ENCOUNTER — Inpatient Hospital Stay (HOSPITAL_COMMUNITY)
Admission: AD | Admit: 2010-05-28 | Discharge: 2010-05-30 | DRG: 312 | Disposition: A | Discharge status: Home / Self Care | Payer: Medicare (Managed Care) | Source: Ambulatory Visit | Attending: Otolaryngology | Admitting: Otolaryngology

## 2010-05-28 ENCOUNTER — Emergency Department (HOSPITAL_COMMUNITY): Payer: Medicare (Managed Care)

## 2010-05-28 DIAGNOSIS — F17200 Nicotine dependence, unspecified, uncomplicated: Secondary | ICD-10-CM | POA: Diagnosis present

## 2010-05-28 DIAGNOSIS — F0390 Unspecified dementia without behavioral disturbance: Secondary | ICD-10-CM | POA: Diagnosis present

## 2010-05-28 DIAGNOSIS — Z8673 Personal history of transient ischemic attack (TIA), and cerebral infarction without residual deficits: Secondary | ICD-10-CM

## 2010-05-28 DIAGNOSIS — R55 Syncope and collapse: Principal | ICD-10-CM | POA: Diagnosis present

## 2010-05-28 DIAGNOSIS — N390 Urinary tract infection, site not specified: Secondary | ICD-10-CM | POA: Diagnosis present

## 2010-05-28 DIAGNOSIS — I10 Essential (primary) hypertension: Secondary | ICD-10-CM | POA: Diagnosis present

## 2010-05-28 LAB — GLUCOSE, CAPILLARY: Glucose-Capillary: 152 mg/dL — ABNORMAL HIGH (ref 70–99)

## 2010-05-28 LAB — CBC
MCHC: 33.3 g/dL (ref 30.0–36.0)
MCV: 73.3 fL — ABNORMAL LOW (ref 78.0–100.0)
RBC: 6.4 MIL/uL — ABNORMAL HIGH (ref 4.22–5.81)
RDW: 15.1 % (ref 11.5–15.5)

## 2010-05-28 LAB — PROTIME-INR
INR: 1.03 (ref 0.00–1.49)
Prothrombin Time: 13.7 seconds (ref 11.6–15.2)

## 2010-05-28 LAB — DIFFERENTIAL
Basophils Absolute: 0 10*3/uL (ref 0.0–0.1)
Basophils Relative: 0 % (ref 0–1)
Eosinophils Absolute: 0 10*3/uL (ref 0.0–0.7)
Lymphocytes Relative: 39 % (ref 12–46)
Lymphs Abs: 2.2 10*3/uL (ref 0.7–4.0)
Neutro Abs: 3 10*3/uL (ref 1.7–7.7)

## 2010-05-28 LAB — BASIC METABOLIC PANEL
GFR calc Af Amer: >60 mL/min (ref >60)
Glucose, Bld: 128 mg/dL — ABNORMAL HIGH (ref 70–99)
Potassium: 3.4 mEq/L — ABNORMAL LOW (ref 3.5–5.1)
Sodium: 137 mEq/L (ref 135–145)

## 2010-05-28 LAB — CARDIAC PANEL(CRET KIN+CKTOT+MB+TROPI)
CK, MB: 3.2 ng/mL (ref 0.3–4.0)
Relative Index: 2 (ref 0.0–2.5)

## 2010-05-29 LAB — CARDIAC PANEL(CRET KIN+CKTOT+MB+TROPI)
CK, MB: 3 ng/mL (ref 0.3–4.0)
Relative Index: 2.6 — ABNORMAL HIGH (ref 0.0–2.5)
Total CK: 116 U/L (ref 7–232)
Troponin I: <0.3 ng/mL (ref <0.30)
Troponin I: <0.3 ng/mL (ref <0.30)
Troponin I: <0.3 ng/mL (ref <0.30)

## 2010-05-29 LAB — HEPATIC FUNCTION PANEL
ALT: 21 U/L (ref 0–53)
AST: 24 U/L (ref 0–37)
Albumin: 3.8 g/dL (ref 3.5–5.2)
Bilirubin, Direct: 0.1 mg/dL (ref 0.0–0.3)
Total Protein: 7.7 g/dL (ref 6.0–8.3)

## 2010-05-29 LAB — URINALYSIS, ROUTINE W REFLEX MICROSCOPIC
Specific Gravity, Urine: >1.03 — ABNORMAL HIGH (ref 1.005–1.030)
Urobilinogen, UA: 0.2 mg/dL (ref 0.0–1.0)
pH: 5.5 (ref 5.0–8.0)

## 2010-05-29 LAB — D-DIMER, QUANTITATIVE: D-Dimer, Quant: 0.36 ug/mL-FEU (ref 0.00–0.48)

## 2010-05-29 LAB — URINE MICROSCOPIC-ADD ON

## 2010-05-29 LAB — VITAMIN B12: Vitamin B-12: 835 pg/mL (ref 211–911)

## 2010-05-30 ENCOUNTER — Inpatient Hospital Stay (HOSPITAL_COMMUNITY): Payer: Medicare (Managed Care)

## 2010-05-30 DIAGNOSIS — R55 Syncope and collapse: Secondary | ICD-10-CM

## 2010-05-31 LAB — URINE CULTURE
Colony Count: >=100000
Culture  Setup Time: 201205290054

## 2010-06-02 NOTE — H&P (Signed)
Jason [name], Jason [name] NO.:  [id number]  MEDICAL RECORD NO.:  [id number]           PATIENT TYPE:  I  LOCATION:  A307                          FACILITY:  [facility]  PHYSICIAN:  Tarry Kos, MD       DATE OF BIRTH:  Jan 04, 1945  DATE OF ADMISSION:  05/28/2010 DATE OF DISCHARGE:  LH                             HISTORY & PHYSICAL   CHIEF COMPLAINT:  Passed out.  HISTORY OF PRESENT ILLNESS:  Jason [name] is a 65 year old male who has a history of hypertension, previous stroke, however, I cannot find any documentation that he has had a previous stroke.  In his past medical records except a abnormal MRI in 2002, I cannot find the associated admission H and P and dementia who comes in the ED with family members after suffering a syncopal episode.  The patient is a very poor historian himself.  Most of the history is obtained from the ED physician and his previous records.  However, he was sitting at a table after he had come in from being outside and passed out at the table for about 15 minutes.  He is just going to passed out in a chair. There was no seizure activity according to his sister.  He had been in his normal state of health.  When I asked Jason [name] questions, he does answer simply yes or no questions, but when you ask me if he has had a history of a stroke before he says no and then he says in the next sentence I have just had one stroke, so he says yes and no to the same questions so really not clear.  He says he has been in his normal state of health for the last several days.  He has not had any issues.  He says he has not been running any fevers.  Family is not around right now for me to get history from, but it was reported that he did not have any seizure-like activity.  He denies any focal neurological symptoms right now, denies any problems speaking, nor with his arms or legs.  He is currently sitting up in bed and he is feeding himself.  REVIEW OF  SYSTEMS:  Otherwise unobtainable.  PAST MEDICAL HISTORY: 1. CVA 2. Hypertension. 3. Dementia.  ALLERGIES:  None.  SOCIAL HISTORY:  He says he smokes Durwin Nora cigarettes.  Denies any alcohol.  No IV drug abuse.  He says he lives with his sister.  MEDICATIONS:  He does not know.  PHYSICAL EXAMINATION:  VITALS:  His temperature 97.4, blood pressure 107/65, pulse in the 70s, respirations 18 and 98% O2 sats on room air. GENERAL:  He is alert.  He is oriented to person and place, but he does not know what year it is.  He knows he is in [place] [facility]. HEENT:  Extraocular muscles intact.  Pupils equal and reactive to light. Oropharynx is clear.  Mucous membrane is moist. NECK:  No JVD and no carotid bruits. COR:  Regular rate and rhythm without murmurs or gallops. CHEST:  Clear to auscultation bilaterally.  No wheeze, rhonchi, or rales. ABDOMEN:  Soft, nontender and nondistended.  Positive bowel sounds.  No hepatosplenomegaly. EXTREMITIES:  No clubbing, cyanosis or edema. PSYCH:  Normal affect. NEURO:  No focal neurologic deficits.  Cranial nerves II through XII grossly intact.  Strength 5/5 upper and lower extremities bilaterally and equal. SKIN:  No rashes.  LABORATORY DATA:  CT of his head shows old lacunar infarcts and chronic encephalomalacia.  No acute issues.  Chest x-ray is negative.  I cannot find his 12-lead EKG on the chart, but according to report from ER physician it was normal.  It was right bundle-branch block again cannot find on this 12-lead EKG.  Urinalysis positive for nitrite and moderate leukocytes.  He has got many white blood cell, many bacteria.  LFTs are normal.  Coags are normal.  Potassium is 3.4.  BUN and creatinine are normal.  Cardiac enzymes were negative.  White count is normal. Hemoglobin is 15.6.  ASSESSMENT AND PLAN:  A 65 year old male with a syncopal episode. 1. Syncopal episode.  It does not sound like seizure activity.   We     will monitor closely for any arrhythmias, obtain neuro checks every     q.4 h, obtain a echo and MRI of his brain and check a B12 and     folate level.  He does not have any focal neurological symptoms at     this time.  He does have a right bundle-branch block on his EKG.  I     cannot find any previous one has been done here.  We will check     orthostatics also. 2. Urinary tract infection.  We will place on ciprofloxacin. 3. Hypertension.  This is stable. 4. We will clarify his home medications as we have no idea what he is     on at home, place him on aspirin for now and antibiotics for his     UTI. 5. The patient is full code.  Further recommendations depending on     overall hospital course.                                           ______________________________ Tarry Kos, MD     RD/MEDQ  D:  05/29/2010  T:  05/29/2010  Job:  [id number]  Electronically Signed by Tarry Kos MD on 06/02/2010 11:51:50 AM

## 2010-06-08 NOTE — Discharge Summary (Signed)
  NAMELAJUAN, KOVALESKI NO.:  [id number]  MEDICAL RECORD NO.:  [id number]           PATIENT TYPE:  I  LOCATION:  A307                          FACILITY:  [facility]  PHYSICIAN:  Nelvin Tomb L. Lendell Caprice, MDDATE OF BIRTH:  12-08-1945  DATE OF ADMISSION:  05/28/2010 DATE OF DISCHARGE:  05/29/2012LH                              DISCHARGE SUMMARY   DISCHARGE DIAGNOSES: 1. Syncopal. 2. Urinary tract infection. 3. History of stroke. 4. Tobacco abuse, counseled against. 5. Hypertension. 6. Dementia.  DISCHARGE MEDICATIONS:  Ciprofloxacin 500 mg p.o. b.i.d. until gone. Continue the rest of his medications which include: 1. Benazepril 20 mg a day. 2. Vitamin D2, 400 units 2 tablets daily. 3. Multivitamin a day. 4. Aspirin 81 mg a day. 5. Aricept 10 mg a day.  CONDITION:  Stable.  ACTIVITY:  Ad lib.  FOLLOWUP:  With Dr. Dorothe Pea next week.  Please follow up echocardiogram results.  CONSULTATIONS:  None.  PROCEDURES:  None.  LABORATORY DATA:  CBC normal.  Complete metabolic panel on admission significant for a potassium of 3.4.  Serial cardiac enzymes negative. TSH, B12, folate normal.  Urinalysis showed a specific gravity of greater than 1.030, small bilirubin, trace ketones, trace blood, positive nitrites, moderate leukocyte esterase, 21-50 white cells, many bacteria.  Urine cultures pending.  He had a normal D-dimer.  DIAGNOSTICS:  Echocardiogram is pending.  Carotid Dopplers showed mild atherosclerotic disease without significant stenosis less than 50%, vertebral artery flow was antegrade.  Chest x-ray showed question COPD. CT brain showed nothing acute, moderate cortical volume loss, chronic encephalomalacia within the right frontal and left parietal regions, chronic lacunar infarcts.  EKG showed right bundle-branch block.  HISTORY AND HOSPITAL COURSE:  Please see H and P for details.  Jason [name] is a 65 year old black male who was outside, then came in  and sat down at the dining room table.  He apparently became unresponsive. Apparently, he was diaphoretic.  His family member went to get a wet rag and he started waking up without any postictal phase.  He had no chest pain, palpitations, headache, dysuria, cough, shortness of breath.  On exam in the emergency room, he had a blood pressure of 107/65, pulse 70, afebrile, normal respiratory rate and oxygen saturation.  He was oriented and had a nonfocal neurologic exam.  He had moist mucous membranes and otherwise unremarkable exam.  He was found to have a urinary tract infection.  He was monitored on telemetry.  Echocardiogram is pending, but he has been stable and has had no symptoms.  He has been treated for his urinary tract infection with Cipro and will continue to complete a 5-day course.  He will follow up with Dr. Dorothe Pea next week.  Total time on the day of discharge is greater than 30 minutes.     Rafia Shedden L. Lendell Caprice, MD     CLS/MEDQ  D:  05/30/2010  T:  05/31/2010  Job:  [id number]  Electronically Signed by Crista Curb MD on 06/08/2010 07:26:40 AM

## 2012-12-21 ENCOUNTER — Emergency Department (HOSPITAL_COMMUNITY)
Admission: EM | Admit: 2012-12-21 | Discharge: 2012-12-21 | Disposition: A | Discharge status: Home / Self Care | Payer: Medicare (Managed Care) | Attending: Emergency Medicine | Admitting: Emergency Medicine

## 2012-12-21 ENCOUNTER — Encounter (HOSPITAL_COMMUNITY): Payer: Self-pay | Admitting: Emergency Medicine

## 2012-12-21 DIAGNOSIS — Z8673 Personal history of transient ischemic attack (TIA), and cerebral infarction without residual deficits: Secondary | ICD-10-CM | POA: Insufficient documentation

## 2012-12-21 DIAGNOSIS — F17200 Nicotine dependence, unspecified, uncomplicated: Secondary | ICD-10-CM | POA: Insufficient documentation

## 2012-12-21 DIAGNOSIS — N390 Urinary tract infection, site not specified: Secondary | ICD-10-CM

## 2012-12-21 HISTORY — DX: Cerebral infarction, unspecified: I63.9

## 2012-12-21 LAB — URINALYSIS, ROUTINE W REFLEX MICROSCOPIC
Glucose, UA: NEGATIVE mg/dL
pH: 6.5 (ref 5.0–8.0)

## 2012-12-21 LAB — URINE MICROSCOPIC-ADD ON

## 2012-12-21 MED ORDER — CIPROFLOXACIN HCL 500 MG PO TABS: 500.0000 mg | ORAL_TABLET | Freq: Two times a day (BID) | ORAL | Status: DC

## 2012-12-21 NOTE — ED Notes (Signed)
Pt sister reports hematuria/dysuria.

## 2012-12-21 NOTE — ED Notes (Signed)
Pt states that he noticed his urine being a different color on Monday, sister at bedside states that she found the urine in the commode today when pt did not flush after urination, pt admits to "some" pain with urination, urine at bedside dark color.

## 2012-12-21 NOTE — ED Provider Notes (Addendum)
CSN: [id number]     Arrival date & time 12/21/12  1111 History  This chart was scribed for Gwyneth Sprout, MD by Leone Payor, ED Scribe. This patient was seen in room APA10/APA10 and the patient's care was started 11:51 AM.     Chief Complaint  Patient presents with  . Hematuria  . Dysuria    The history is provided by the patient. No language interpreter was used.    HPI Comments: Jason [name] is a 67 y.o. male who presents to the Emergency Department complaining of 6 days of gradual onset, gradually worsening, constant dysuria and hematuria. He also reports urinary hesitancy. He denies fever, flank pain, nausea, vomiting.   Past Medical History  Diagnosis Date  . Stroke    History reviewed. No pertinent past surgical history. No family history on file. History  Substance Use Topics  . Smoking status: Current Every Day Smoker  . Smokeless tobacco: Not on file  . Alcohol Use: Yes    Review of Systems A complete 10 system review of systems was obtained and all systems are negative except as noted in the HPI and PMH.   Allergies  Review of patient's allergies indicates no known allergies.  Home Medications  No current outpatient prescriptions on file. There were no vitals taken for this visit. Physical Exam  Nursing note and vitals reviewed. Constitutional: He is oriented to person, place, and time. He appears well-developed and well-nourished.  HENT:  Head: Normocephalic and atraumatic.  Cardiovascular: Normal rate, regular rhythm and normal heart sounds.   Pulmonary/Chest: Effort normal and breath sounds normal. No respiratory distress. He has no wheezes. He has no rales.  Abdominal: Soft. Bowel sounds are normal. He exhibits no distension.  No suprapubic tenderness.   Genitourinary:  No flank tenderness.   Neurological: He is alert and oriented to person, place, and time.  Skin: Skin is warm and dry.  Psychiatric: He has a normal mood and affect.    ED Course   Procedures   DIAGNOSTIC STUDIES: COORDINATION OF CARE: 11:53 AM Discussed treatment plan with pt at bedside and pt agreed to plan.   Labs Review Labs Reviewed  URINALYSIS, ROUTINE W REFLEX MICROSCOPIC - Abnormal; Notable for the following:    Color, Urine BROWN (*)    APPearance CLOUDY (*)    Specific Gravity, Urine >1.030 (*)    Hgb urine dipstick LARGE (*)    Bilirubin Urine MODERATE (*)    Ketones, ur TRACE (*)    Protein, ur >300 (*)    Nitrite POSITIVE (*)    Leukocytes, UA TRACE (*)    All other components within normal limits  URINE MICROSCOPIC-ADD ON - Abnormal; Notable for the following:    Squamous Epithelial / LPF FEW (*)    Bacteria, UA MANY (*)    All other components within normal limits  URINE CULTURE   Imaging Review No results found.  EKG Interpretation   None       MDM   1. UTI (lower urinary tract infection)     Patient with symptoms most consistent with a UTI. He denies any rectal pain or difficulty defecating. He has no suprapubic pain or flank pain. Symptoms concerning for pyelonephritis. The UA is consistent with a urinary tract infection with too numerous to count white blood cells, positive nitrites and leukocytes. Patient has never had a urinary tract infection in the past we'll treat with Cipro and to follow up with his regular physician for any problems  the  I personally performed the services described in this documentation, which was scribed in my presence.  The recorded information has been reviewed and considered.   Gwyneth Sprout, MD 12/21/12 1610  Gwyneth Sprout, MD 12/21/12 1213

## 2012-12-23 LAB — URINE CULTURE: Colony Count: >=100000

## 2013-01-12 ENCOUNTER — Other Ambulatory Visit: Payer: Self-pay | Admitting: Family Medicine

## 2013-01-12 DIAGNOSIS — R109 Unspecified abdominal pain: Secondary | ICD-10-CM

## 2013-01-12 DIAGNOSIS — R319 Hematuria, unspecified: Secondary | ICD-10-CM

## 2013-01-13 ENCOUNTER — Ambulatory Visit
Admission: RE | Admit: 2013-01-13 | Discharge: 2013-01-13 | Disposition: A | Discharge status: Home / Self Care | Payer: No Typology Code available for payment source | Source: Ambulatory Visit | Attending: Family Medicine | Admitting: Family Medicine

## 2013-01-13 DIAGNOSIS — R109 Unspecified abdominal pain: Secondary | ICD-10-CM

## 2013-01-13 DIAGNOSIS — R319 Hematuria, unspecified: Secondary | ICD-10-CM

## 2013-01-27 ENCOUNTER — Other Ambulatory Visit: Payer: Self-pay | Admitting: Family Medicine

## 2013-01-27 ENCOUNTER — Ambulatory Visit
Admission: RE | Admit: 2013-01-27 | Discharge: 2013-01-27 | Disposition: A | Discharge status: Home / Self Care | Payer: Medicare (Managed Care) | Source: Ambulatory Visit | Attending: Family Medicine | Admitting: Family Medicine

## 2013-01-27 DIAGNOSIS — I10 Essential (primary) hypertension: Secondary | ICD-10-CM

## 2013-01-29 ENCOUNTER — Other Ambulatory Visit: Payer: Self-pay | Admitting: Urology

## 2013-02-09 ENCOUNTER — Encounter (HOSPITAL_COMMUNITY): Payer: Self-pay | Admitting: Pharmacy Technician

## 2013-02-10 ENCOUNTER — Encounter (HOSPITAL_COMMUNITY): Payer: Self-pay

## 2013-02-10 ENCOUNTER — Encounter (HOSPITAL_COMMUNITY)
Admission: RE | Admit: 2013-02-10 | Discharge: 2013-02-10 | Disposition: A | Discharge status: Home / Self Care | Payer: Medicare (Managed Care) | Source: Ambulatory Visit | Attending: Urology | Admitting: Urology

## 2013-02-10 ENCOUNTER — Encounter (INDEPENDENT_AMBULATORY_CARE_PROVIDER_SITE_OTHER): Payer: Self-pay

## 2013-02-10 DIAGNOSIS — E0520 Thyrotoxicosis with toxic multinodular goiter without thyrotoxic crisis or storm: Secondary | ICD-10-CM

## 2013-02-10 DIAGNOSIS — Z01810 Encounter for preprocedural cardiovascular examination: Secondary | ICD-10-CM | POA: Insufficient documentation

## 2013-02-10 DIAGNOSIS — Z01812 Encounter for preprocedural laboratory examination: Secondary | ICD-10-CM | POA: Insufficient documentation

## 2013-02-10 DIAGNOSIS — N3289 Other specified disorders of bladder: Secondary | ICD-10-CM

## 2013-02-10 HISTORY — DX: Thyrotoxicosis with toxic multinodular goiter without thyrotoxic crisis or storm: E05.20

## 2013-02-10 HISTORY — DX: Other specified disorders of bladder: N32.89

## 2013-02-10 HISTORY — DX: Essential (primary) hypertension: I10

## 2013-02-10 HISTORY — DX: Chronic obstructive pulmonary disease, unspecified: J44.9

## 2013-02-10 LAB — BASIC METABOLIC PANEL
BUN: 15 mg/dL (ref 6–23)
CHLORIDE: 101 meq/L (ref 96–112)
CO2: 27 meq/L (ref 19–32)
CREATININE: 1.03 mg/dL (ref 0.50–1.35)
Calcium: 9 mg/dL (ref 8.4–10.5)
GFR calc Af Amer: 84 mL/min — ABNORMAL LOW (ref >90)
GFR calc non Af Amer: 72 mL/min — ABNORMAL LOW (ref >90)
GLUCOSE: 92 mg/dL (ref 70–99)
Potassium: 4 mEq/L (ref 3.7–5.3)
Sodium: 138 mEq/L (ref 137–147)

## 2013-02-10 LAB — CBC
HEMATOCRIT: 47.4 % (ref 39.0–52.0)
HEMOGLOBIN: 15.6 g/dL (ref 13.0–17.0)
MCH: 24.1 pg — ABNORMAL LOW (ref 26.0–34.0)
MCHC: 32.9 g/dL (ref 30.0–36.0)
MCV: 73.4 fL — ABNORMAL LOW (ref 78.0–100.0)
Platelets: 209 10*3/uL (ref 150–400)
RBC: 6.46 MIL/uL — ABNORMAL HIGH (ref 4.22–5.81)
RDW: 15.1 % (ref 11.5–15.5)
WBC: 4.9 10*3/uL (ref 4.0–10.5)

## 2013-02-10 NOTE — Patient Instructions (Addendum)
Jason [name]  02/10/2013   Your procedure is scheduled on:  2-13 -2015  Report to [facility] at     [place]   AM.  Call this number if you have problems the morning of surgery: [phone]  Or Presurgical Testing [phone](Reshaun Briseno) For Living Will and/or Health Care Power Attorney Forms: please provide copy for your medical record,may bring AM of surgery(Forms should be already notarized -we do not provide this service).  Remember: Follow any bowel prep instructions per MD office.    Do not eat food:After Midnight.    Take these medicines the morning of surgery with A SIP OF WATER: Take usual PM meds. Take no meds AM of surgery.   Do not wear jewelry, make-up or nail polish.  Do not wear lotions, powders, or perfumes. You may wear deodorant.  Do not shave 48 hours(2 days) prior to first CHG shower(legs and under arms).(Shaving face and neck okay.)  Do not bring valuables to the hospital.(Hospital is not responsible for lost valuables).  Contacts, dentures or removable bridgework, body piercing, hair pins may not be worn into surgery.  Leave suitcase in the car. After surgery it may be brought to your room.  For patients admitted to the hospital, checkout time is 11:00 AM the day of discharge.(Restricted visitors-Any Persons displaying flu-like symptoms or illness).    Patients discharged the day of surgery will not be allowed to drive home. Must have responsible person with you x 24 hours once discharged.  Name and phone number of your driver: [name] [phone] cell  Special Instructions: CHG(Chlorhedine 4%-"Hibiclens","Betasept","Aplicare") Shower Use Special Wash: see special instructions.(avoid face and genitals)    Failure to follow these instructions may result in Cancellation of your surgery.   Patient signature_______________________________________________________

## 2013-02-10 NOTE — Pre-Procedure Instructions (Signed)
02-10-13 EKG done today. CXR  01-27-13 Epic. Ekg received from [facility] - 2-30-15(unconfirmed)-copy with chart.

## 2013-02-13 ENCOUNTER — Ambulatory Visit (HOSPITAL_COMMUNITY): Payer: Medicare (Managed Care) | Admitting: Certified Registered Nurse Anesthetist

## 2013-02-13 ENCOUNTER — Ambulatory Visit (HOSPITAL_COMMUNITY)
Admission: RE | Admit: 2013-02-13 | Discharge: 2013-02-13 | Disposition: A | Discharge status: Skilled Nursing Facility | Payer: Medicare (Managed Care) | Source: Ambulatory Visit | Attending: Urology | Admitting: Urology

## 2013-02-13 ENCOUNTER — Encounter (HOSPITAL_COMMUNITY): Payer: Medicare (Managed Care) | Admitting: Certified Registered Nurse Anesthetist

## 2013-02-13 ENCOUNTER — Encounter (HOSPITAL_COMMUNITY)
Admission: RE | Disposition: A | Discharge status: Skilled Nursing Facility | Payer: Self-pay | Source: Ambulatory Visit | Attending: Urology

## 2013-02-13 ENCOUNTER — Encounter (HOSPITAL_COMMUNITY): Payer: Self-pay | Admitting: *Deleted

## 2013-02-13 DIAGNOSIS — F0390 Unspecified dementia without behavioral disturbance: Secondary | ICD-10-CM | POA: Insufficient documentation

## 2013-02-13 DIAGNOSIS — Z8673 Personal history of transient ischemic attack (TIA), and cerebral infarction without residual deficits: Secondary | ICD-10-CM | POA: Insufficient documentation

## 2013-02-13 DIAGNOSIS — C679 Malignant neoplasm of bladder, unspecified: Secondary | ICD-10-CM | POA: Insufficient documentation

## 2013-02-13 DIAGNOSIS — I10 Essential (primary) hypertension: Secondary | ICD-10-CM | POA: Insufficient documentation

## 2013-02-13 DIAGNOSIS — E0590 Thyrotoxicosis, unspecified without thyrotoxic crisis or storm: Secondary | ICD-10-CM | POA: Insufficient documentation

## 2013-02-13 DIAGNOSIS — Z79899 Other long term (current) drug therapy: Secondary | ICD-10-CM | POA: Insufficient documentation

## 2013-02-13 DIAGNOSIS — F1011 Alcohol abuse, in remission: Secondary | ICD-10-CM | POA: Insufficient documentation

## 2013-02-13 DIAGNOSIS — D494 Neoplasm of unspecified behavior of bladder: Secondary | ICD-10-CM

## 2013-02-13 DIAGNOSIS — F17200 Nicotine dependence, unspecified, uncomplicated: Secondary | ICD-10-CM | POA: Insufficient documentation

## 2013-02-13 HISTORY — PX: TRANSURETHRAL RESECTION OF BLADDER TUMOR WITH GYRUS (TURBT-GYRUS): SHX6458

## 2013-02-13 HISTORY — PX: CYSTOSCOPY/RETROGRADE/URETEROSCOPY: SHX5316

## 2013-02-13 SURGERY — TRANSURETHRAL RESECTION OF BLADDER TUMOR WITH GYRUS (TURBT-GYRUS)
Anesthesia: General

## 2013-02-13 MED ORDER — PROMETHAZINE HCL 25 MG/ML IJ SOLN: 6.2500 mg | INTRAMUSCULAR | Status: DC | PRN

## 2013-02-13 MED ORDER — PROPOFOL 10 MG/ML IV BOLUS
INTRAVENOUS | Status: AC
  Filled 2013-02-13: qty 20

## 2013-02-13 MED ORDER — PROPOFOL 10 MG/ML IV BOLUS
INTRAVENOUS | Status: DC | PRN
  Administered 2013-02-13: 170 mg via INTRAVENOUS
  Administered 2013-02-13: 30 mg via INTRAVENOUS
  Administered 2013-02-13: 100 mg via INTRAVENOUS

## 2013-02-13 MED ORDER — SUCCINYLCHOLINE CHLORIDE 20 MG/ML IJ SOLN
INTRAMUSCULAR | Status: DC | PRN
  Administered 2013-02-13 (×2): 100 mg via INTRAVENOUS

## 2013-02-13 MED ORDER — LIDOCAINE HCL (CARDIAC) 20 MG/ML IV SOLN
INTRAVENOUS | Status: AC
  Filled 2013-02-13: qty 5

## 2013-02-13 MED ORDER — FENTANYL CITRATE 0.05 MG/ML IJ SOLN
INTRAMUSCULAR | Status: DC | PRN
  Administered 2013-02-13: 25 ug via INTRAVENOUS
  Administered 2013-02-13: 50 ug via INTRAVENOUS
  Administered 2013-02-13: 25 ug via INTRAVENOUS
  Administered 2013-02-13: 50 ug via INTRAVENOUS

## 2013-02-13 MED ORDER — LIDOCAINE HCL 2 % EX GEL
CUTANEOUS | Status: AC
  Filled 2013-02-13: qty 10

## 2013-02-13 MED ORDER — DEXAMETHASONE SODIUM PHOSPHATE 4 MG/ML IJ SOLN
INTRAMUSCULAR | Status: DC | PRN
  Administered 2013-02-13: 10 mg via INTRAVENOUS

## 2013-02-13 MED ORDER — ACETAMINOPHEN-CODEINE #3 300-30 MG PO TABS: 1.0000 | ORAL_TABLET | ORAL | Status: DC | PRN

## 2013-02-13 MED ORDER — EPHEDRINE SULFATE 50 MG/ML IJ SOLN
INTRAMUSCULAR | Status: DC | PRN
  Administered 2013-02-13: 10 mg via INTRAVENOUS

## 2013-02-13 MED ORDER — SODIUM CHLORIDE 0.9 % IR SOLN
Status: DC | PRN
  Administered 2013-02-13: 18000 mL

## 2013-02-13 MED ORDER — BELLADONNA ALKALOIDS-OPIUM 16.2-60 MG RE SUPP
RECTAL | Status: AC
  Filled 2013-02-13: qty 1

## 2013-02-13 MED ORDER — ONDANSETRON HCL 4 MG/2ML IJ SOLN
INTRAMUSCULAR | Status: AC
  Filled 2013-02-13: qty 2

## 2013-02-13 MED ORDER — SUCCINYLCHOLINE CHLORIDE 20 MG/ML IJ SOLN: INTRAMUSCULAR | Status: DC | PRN

## 2013-02-13 MED ORDER — BELLADONNA ALKALOIDS-OPIUM 16.2-60 MG RE SUPP
RECTAL | Status: DC | PRN
  Administered 2013-02-13: 1 via RECTAL

## 2013-02-13 MED ORDER — ONDANSETRON HCL 4 MG/2ML IJ SOLN
INTRAMUSCULAR | Status: DC | PRN
  Administered 2013-02-13: 4 mg via INTRAVENOUS

## 2013-02-13 MED ORDER — SODIUM CHLORIDE 0.9 % IJ SOLN
INTRAMUSCULAR | Status: AC
  Filled 2013-02-13: qty 10

## 2013-02-13 MED ORDER — IOHEXOL 300 MG/ML  SOLN
INTRAMUSCULAR | Status: DC | PRN
  Administered 2013-02-13: 10 mL via URETHRAL

## 2013-02-13 MED ORDER — FENTANYL CITRATE 0.05 MG/ML IJ SOLN
INTRAMUSCULAR | Status: AC
  Filled 2013-02-13: qty 2

## 2013-02-13 MED ORDER — EPHEDRINE SULFATE 50 MG/ML IJ SOLN
INTRAMUSCULAR | Status: AC
  Filled 2013-02-13: qty 1

## 2013-02-13 MED ORDER — LIDOCAINE HCL 2 % EX GEL
CUTANEOUS | Status: DC | PRN
  Administered 2013-02-13: 1 via URETHRAL

## 2013-02-13 MED ORDER — MIDAZOLAM HCL 2 MG/2ML IJ SOLN
INTRAMUSCULAR | Status: AC
  Filled 2013-02-13: qty 2

## 2013-02-13 MED ORDER — MORPHINE SULFATE 10 MG/ML IJ SOLN: 1.0000 mg | INTRAMUSCULAR | Status: DC | PRN

## 2013-02-13 MED ORDER — LACTATED RINGERS IV SOLN: INTRAVENOUS | Status: DC

## 2013-02-13 MED ORDER — CIPROFLOXACIN IN D5W 400 MG/200ML IV SOLN
INTRAVENOUS | Status: AC
  Filled 2013-02-13: qty 200

## 2013-02-13 MED ORDER — LACTATED RINGERS IV SOLN
INTRAVENOUS | Status: DC | PRN
  Administered 2013-02-13 (×2): via INTRAVENOUS

## 2013-02-13 MED ORDER — PHENYLEPHRINE HCL 10 MG/ML IJ SOLN
INTRAMUSCULAR | Status: DC | PRN
  Administered 2013-02-13: 40 ug via INTRAVENOUS
  Administered 2013-02-13: 80 ug via INTRAVENOUS
  Administered 2013-02-13: 120 ug via INTRAVENOUS
  Administered 2013-02-13: 80 ug via INTRAVENOUS

## 2013-02-13 MED ORDER — CIPROFLOXACIN IN D5W 400 MG/200ML IV SOLN
400.0000 mg | INTRAVENOUS | Status: AC
Start: 2013-02-13 — End: 2013-02-13
  Administered 2013-02-13: 400 mg via INTRAVENOUS

## 2013-02-13 MED ORDER — DOCUSATE SODIUM 100 MG PO CAPS: 100.0000 mg | ORAL_CAPSULE | Freq: Two times a day (BID) | ORAL | Status: DC | PRN

## 2013-02-13 MED ORDER — LIDOCAINE HCL (CARDIAC) 20 MG/ML IV SOLN
INTRAVENOUS | Status: DC | PRN
  Administered 2013-02-13: 100 mg via INTRAVENOUS

## 2013-02-13 SURGICAL SUPPLY — 18 items
BAG URINE DRAINAGE (UROLOGICAL SUPPLIES) ×2 IMPLANT
BAG URO CATCHER STRL LF (DRAPE) ×4 IMPLANT
CATH FOLEY 3WAY 30CC 20FR (CATHETERS) ×2 IMPLANT
CATH URET 5FR 28IN OPEN ENDED (CATHETERS) ×2 IMPLANT
DRAPE CAMERA CLOSED 9X96 (DRAPES) ×4 IMPLANT
ELECT LOOP MED HF 24F 12D CBL (CLIP) ×2 IMPLANT
ELECT REM PT RETURN 9FT ADLT (ELECTROSURGICAL)
ELECTRODE REM PT RTRN 9FT ADLT (ELECTROSURGICAL) ×2 IMPLANT
EVACUATOR MICROVAS BLADDER (UROLOGICAL SUPPLIES) IMPLANT
GLOVE BIOGEL M STRL SZ7.5 (GLOVE) ×8 IMPLANT
GOWN STRL REUS W/TWL LRG LVL3 (GOWN DISPOSABLE) ×4 IMPLANT
KIT ASPIRATION TUBING (SET/KITS/TRAYS/PACK) IMPLANT
LOOPS RESECTOSCOPE DISP (ELECTROSURGICAL) ×2 IMPLANT
MANIFOLD NEPTUNE II (INSTRUMENTS) ×4 IMPLANT
PACK CYSTO (CUSTOM PROCEDURE TRAY) ×4 IMPLANT
SYRINGE IRR TOOMEY STRL 70CC (SYRINGE) IMPLANT
TUBING CONNECTING 10 (TUBING) ×3 IMPLANT
TUBING CONNECTING 10' (TUBING) ×1

## 2013-02-13 NOTE — Interval H&P Note (Signed)
History and Physical Interval Note:  02/13/2013 5:49 AM  Jason [name]  has presented today for surgery, with the diagnosis of bladder mass  The various methods of treatment have been discussed with the patient and family. 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  The patient's history has been reviewed, patient examined, no change in status, stable for surgery.  I have reviewed the patient's chart and labs.  Questions were answered to the patient's satisfaction.     Louis Meckel W

## 2013-02-13 NOTE — Preoperative (Signed)
Beta Blockers   Reason not to administer Beta Blockers:Not Applicable 

## 2013-02-13 NOTE — Anesthesia Preprocedure Evaluation (Addendum)
Anesthesia Evaluation  Patient identified by MRN, date of birth, ID band Patient awake    Reviewed: Allergy & Precautions, H&P , NPO status , Patient's Chart, lab work & pertinent test results  Airway Mallampati: II TM Distance: >3 FB Neck ROM: Full    Dental  (+) Missing, Poor Dentition, Teeth Intact   Pulmonary COPDCurrent Smoker,  breath sounds clear to auscultation        Cardiovascular hypertension, Pt. on medications Rhythm:Regular Rate:Normal     Neuro/Psych Dementia; oriented X 1 CVA negative neurological ROS  negative psych ROS   GI/Hepatic negative GI ROS, (+)     substance abuse  alcohol use,   Endo/Other  Hyperthyroidism   Renal/GU negative Renal ROS   Bladder mass negative genitourinary   Musculoskeletal negative musculoskeletal ROS (+)   Abdominal   Peds  Hematology negative hematology ROS (+)   Anesthesia Other Findings   Reproductive/Obstetrics                         Anesthesia Physical Anesthesia Plan  ASA: III  Anesthesia Plan: General   Post-op Pain Management:    Induction: Intravenous  Airway Management Planned: Oral ETT  Additional Equipment:   Intra-op Plan:   Post-operative Plan: Extubation in OR  Informed Consent: I have reviewed the patients History and Physical, chart, labs and discussed the procedure including the risks, benefits and alternatives for the proposed anesthesia with the patient or authorized representative who has indicated his/her understanding and acceptance.   Dental advisory given  Plan Discussed with: CRNA  Anesthesia Plan Comments:         Anesthesia Quick Evaluation

## 2013-02-13 NOTE — H&P (Signed)
Reason For Visit Gross hematuria   History of Present Illness This is a 68 year old male referred by Dr. Barney Drain, M.D. for evaluation and management of gross hematuria.  Patient continued to have gross hematuria proximally on month ago. Urine culture revealed greater than 100,000 in the office, he was treated adequately and repeat urine analysis was performed. Second urine analysis revealed gross hematuria and no evidence of infection. He then continued to have persistent asymptomatic gross hematuria and a kidney/ bladder ultrasound was performed. The ultrasound revealed a large lobulated mass within the bladder. Of note, the prostate was also noted to be irregular and tender on exam.   The patient is a lifelong smoker with a history of alcohol abuse. He works in a [employer] most of his life however, now is disabled secondary to dementia. He now lives in a group home.   Past Medical History Problems  1. History of Dementia (294.20) 2. History of stroke (V12.54)  Surgical History Problems  1. History of No Surgical Problems  Current Meds 1. Benazepril HCl TABS;  Therapy: (Recorded:29Jan2015) to Recorded 2. Multiple Vitamin TABS;  Therapy: (Recorded:29Jan2015) to Recorded  Allergies Medication  1. No Known Drug Allergies  Family History Problems  1. Family history of arthritis (V17.7) 2. Family history of malignant neoplasm (V16.9) 3. Family history of malignant neoplasm of stomach (V16.0) : Father  Social History Problems    Alcohol use   Caffeine use (V49.89)   Current every day smoker (305.1)   Death in the family, father   age 19   Death in the family, mother   Divorced   Retired   Two children  Review of Systems  Genitourinary: nocturia, weak urinary stream, hematuria and initiating urination requires straining.  Respiratory: cough.    Vitals Vital Signs [Data Includes: Last 1 Day]  Recorded: 29Jan2015 12:44PM  Blood Pressure: 143 /  83 Temperature: 98 F Heart Rate: 73  Physical Exam Constitutional: Well nourished and well developed . No acute distress.  ENT:. The ears and nose are normal in appearance.  Neck: The appearance of the neck is normal and no neck mass is present.  Pulmonary: No respiratory distress and normal respiratory rhythm and effort.  Cardiovascular: Heart rate and rhythm are normal . No peripheral edema.  Abdomen: The abdomen is soft and nontender.  Rectal: Estimated prostate size is 1+. Normal rectal tone, no rectal masses, prostate is smooth, symmetric and non-tender.  Genitourinary: Examination of the penis demonstrates no adherence of the prepuce and a normal meatus. The penis is uncircumcised. The scrotum is normal in appearance.  Lymphatics: The femoral and inguinal nodes are not enlarged or tender.  Skin: Normal skin turgor, no visible rash and no visible skin lesions.  Neuro/Psych:. Mood and affect are appropriate.    Results/Data Urine [Data Includes: Last 1 Day]   [id number]  COLOR AMBER   APPEARANCE CLOUDY   SPECIFIC GRAVITY 1.010   pH 6.0   GLUCOSE NEG mg/dL  BILIRUBIN NEG   KETONE NEG mg/dL  BLOOD LARGE   PROTEIN 30 mg/dL  UROBILINOGEN 0.2 mg/dL  NITRITE NEG   LEUKOCYTE ESTERASE MOD   SQUAMOUS EPITHELIAL/HPF RARE   WBC 3-6 WBC/hpf  RBC TNTC RBC/hpf  BACTERIA FEW   CRYSTALS NONE SEEN   CASTS NONE SEEN    Renal/bladder ultrasound performed on/11/15: I independently reviewed these images.  IMPRESSION:  Normal kidneys.  Bladder mass consistent with bladder neoplasm. Biopsy suggested.   Procedure  Procedure: Cystoscopy  Chaperone  Present: Estill Bamberg.  Indication: Hematuria.  Informed Consent: from the patient . Specific risks including, but not limited to bleeding, infection, pain, allergic reaction etc. were explained.  Prep: The patient was prepped with hibiclens.  Antibiotic prophylaxis: Ciprofloxacin.  Procedure Note:  Urethral meatus:. No abnormalities.   Anterior urethra: No abnormalities.  Prostatic urethra: No abnormalities.  Bladder: Visulization was obscured due to cloudy urine. A systematic survey of the bladder demonstrated no bladder tumors or stones. Multiple tumors were identified in the bladder. A sessile tumor was seen in the bladder. This tumor was located on the left side, on the anterior aspect, near the dome of the bladder. Another sessile tumor was seen in the bladder. This tumor was located on the left side, on the posterior aspect, at the base of the bladder.    Assessment Assessed  1. Bladder mass (596.89)  Large multifocal bladder mass occupying the left sidewall of the bladder. The remainder of the bladder appeared normal. There was clear yellow urine from each ureteral orifice.   Plan Bladder mass  1. Follow-up Schedule Surgery Office  Follow-up  Status: Hold For - Appointment   Requested for: [id number] 2. Cysto; Status:Complete;   Done: [id number] Health Maintenance  3. UA With REFLEX; [Do Not Release]; Status:Complete;   Done: [id number] 12:35PM  Discussion/Summary I discussed the findings with the patient, his brother and the accompanying social worker. 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  I recommended that we start with a trans-urethral resection of his bladder tumor. This would allow [place] to get a diagnosis and stage the cancer. Once we have staged the cancer, we can then discuss any further treatment options. In general, superficial bladder cancer requires cystoscopic surveillance on an annual basis. Invasion into the lamina propria typically involves re-resection followed by local immunotherapy with BCG. If the cancer is found to invade the detrusor muscle, options for treatment would be cystectomy with urinary diversion, chemotherapy/radiation, or palliative care.  I briefly discussed this with Dr. Bradd Burner over the phone who agreed with proceeding with the TURBT.  We will also perform bilateral  retrograde pyelograms to evaluate the ureters for any involvement and to complete the staging workup. I have placed the booking sheet, we will get this scheduled in the next 2-3 weeks.

## 2013-02-13 NOTE — Discharge Instructions (Signed)

## 2013-02-13 NOTE — Transfer of Care (Signed)
Immediate Anesthesia Transfer of Care Note  Patient: Jason [name]  Procedure(s) Performed: Procedure(s) (LRB): TRANSURETHRAL RESECTION OF BLADDER TUMOR WITH GYRUS (TURBT-GYRUS), bladder biopsies (N/A) BILATERAL RETROGRADE (Bilateral)  Patient Location: PACU  Anesthesia Type: General  Level of Consciousness: sedated, patient cooperative and responds to stimulation  Airway & Oxygen Therapy: Patient Spontanous Breathing and Patient connected to face mask oxgen  Post-op Assessment: Report given to PACU RN and Post -op Vital signs reviewed and stable  Post vital signs: Reviewed and stable  Complications: No apparent anesthesia complications

## 2013-02-13 NOTE — Anesthesia Postprocedure Evaluation (Signed)
Anesthesia Post Note  Patient: Jason [name]  Procedure(s) Performed: Procedure(s) (LRB): TRANSURETHRAL RESECTION OF BLADDER TUMOR WITH GYRUS (TURBT-GYRUS), bladder biopsies (N/A) BILATERAL RETROGRADE (Bilateral)  Anesthesia type: General  Patient location: PACU  Post pain: Pain level controlled  Post assessment: Post-op Vital signs reviewed  Last Vitals:  Filed Vitals:   02/13/13 1053  BP: 152/89  Pulse: 69  Temp:   Resp: 18    Post vital signs: Reviewed  Level of consciousness: sedated  Complications: No apparent anesthesia complications

## 2013-02-13 NOTE — Op Note (Addendum)
Preoperative diagnosis:  1. Gross hematuria 2. Bladder mass left lateral wall   Postoperative diagnosis:  1. Multifocal bladder mass involving the left lateral wall and bladder dome   Procedure: 1. Cystoscopy, TURBT 2-5 cm 2. Bilateral retrograde pyelogram with interpretation 3. Exam under anesthesia  Surgeon: Ardis Hughs, MD  Anesthesia: General  Complications: None  Intraoperative findings: Multifocal bladder lesion in the bladder dome and left bladder wall with a broad base. Retrograde pyelograms were normal with normal caliber ureters and no filling defects noted within the ureters or renal pelvis/calyces bilaterally. The resection was deep, there was perivesical fat apparent, and as such a Foley catheter was left in place. Exam under anesthesia revealed a slightly enlarged prostate with no nodularity. The bladder was freely mobile, there were no palpable masses.  EBL: Minimal  Specimens: None  Indication: Jason [name] is a 68 y.o. patient with  gross hematuria. Cystoscopy in the office revealed a bladder lesion on the left lateral wall.  After reviewing the management options for treatment, he elected to proceed with the above surgical procedure(s). We have discussed the potential benefits and risks of the procedure, side effects of the proposed treatment, the likelihood of the patient achieving the goals of the procedure, and any potential problems that might occur during the procedure or recuperation. Informed consent has been obtained.  Description of procedure:  The patient was taken to the operating room and general anesthesia was induced.  The patient was placed in the dorsal lithotomy position, prepped and draped in the usual sterile fashion, and preoperative antibiotics were administered. A preoperative time-out was performed.   A 12. 5 22 French rigid cystoscope was gently passed to the patient's urethra and into the bladder. A 12.5 lens was then exchanged for  the 70 lens and a 360 cystoscopic evaluation was performed with the above findings. A 70 lens was then re\re exchanged for a 12.5 lens and a retrograde pyelogram was performed bilaterally. The above findings were noted. The cystoscope was then removed and a 40 French resectoscope sheath was then gently passed to the patient's urethra and into the bladder using the visual obturator. The obturator was exchanged the loop cautery. The left anterior bladder dome mass was then resected in a methodical manner, requiring some suprapubic pressure to obtain the complete resection. These bladder chips were then evacuated from the bladder and sent to pathology separately. We then proceeded to resect the lesion on the right lateral wall. Once the lesion had been completely resected the bladder chips were removed and sent as right lateral wall tumor. A second resection was then performed in this area at the base, this was sent separately and labeled as lateral wall tumor. There was an obturator response during the tumor base resection, and there was concern for small bladder perforation. As such, once hemostasis had been adequately achieved a 20 [place] three-way Foley catheter was inserted into the patient's bladder. The catheter was irrigated several times and each fluid was clear. Prior to inserting the Foley catheter and a exam under anesthesia was performed with the above findings. A BNO suppository was placed in the case. The patient was subsequently awoken and returned back in excellent condition.   Disposition: The catheter will stay in for 7 days. The patient was scheduled for a followup voiding trial in the next week.   Ardis Hughs, M.D.

## 2013-02-16 ENCOUNTER — Encounter (HOSPITAL_COMMUNITY): Payer: Self-pay | Admitting: Urology

## 2013-02-18 ENCOUNTER — Encounter (HOSPITAL_COMMUNITY): Payer: Medicare (Managed Care) | Admitting: Anesthesiology

## 2013-02-18 ENCOUNTER — Inpatient Hospital Stay (HOSPITAL_COMMUNITY): Payer: Medicare (Managed Care)

## 2013-02-18 ENCOUNTER — Inpatient Hospital Stay (HOSPITAL_COMMUNITY): Payer: Medicare (Managed Care) | Admitting: Anesthesiology

## 2013-02-18 ENCOUNTER — Emergency Department (HOSPITAL_COMMUNITY): Payer: Medicare (Managed Care)

## 2013-02-18 ENCOUNTER — Encounter (HOSPITAL_COMMUNITY)
Admission: EM | Disposition: A | Discharge status: Skilled Nursing Facility | Payer: Self-pay | Source: Home / Self Care | Attending: Internal Medicine

## 2013-02-18 ENCOUNTER — Encounter (HOSPITAL_COMMUNITY): Payer: Self-pay | Admitting: Emergency Medicine

## 2013-02-18 ENCOUNTER — Inpatient Hospital Stay (HOSPITAL_COMMUNITY)
Admission: EM | Admit: 2013-02-18 | Discharge: 2013-03-02 | DRG: 856 | Disposition: A | Discharge status: Skilled Nursing Facility | Payer: Medicare (Managed Care) | Attending: Internal Medicine | Admitting: Internal Medicine

## 2013-02-18 DIAGNOSIS — R5381 Other malaise: Secondary | ICD-10-CM

## 2013-02-18 DIAGNOSIS — F17200 Nicotine dependence, unspecified, uncomplicated: Secondary | ICD-10-CM

## 2013-02-18 DIAGNOSIS — C689 Malignant neoplasm of urinary organ, unspecified: Secondary | ICD-10-CM

## 2013-02-18 DIAGNOSIS — Z7982 Long term (current) use of aspirin: Secondary | ICD-10-CM

## 2013-02-18 DIAGNOSIS — E876 Hypokalemia: Secondary | ICD-10-CM | POA: Diagnosis not present

## 2013-02-18 DIAGNOSIS — L89109 Pressure ulcer of unspecified part of back, unspecified stage: Secondary | ICD-10-CM | POA: Diagnosis present

## 2013-02-18 DIAGNOSIS — L8990 Pressure ulcer of unspecified site, unspecified stage: Secondary | ICD-10-CM | POA: Diagnosis present

## 2013-02-18 DIAGNOSIS — N179 Acute kidney failure, unspecified: Secondary | ICD-10-CM

## 2013-02-18 DIAGNOSIS — I10 Essential (primary) hypertension: Secondary | ICD-10-CM

## 2013-02-18 DIAGNOSIS — N170 Acute kidney failure with tubular necrosis: Secondary | ICD-10-CM | POA: Diagnosis present

## 2013-02-18 DIAGNOSIS — J449 Chronic obstructive pulmonary disease, unspecified: Secondary | ICD-10-CM | POA: Diagnosis present

## 2013-02-18 DIAGNOSIS — A498 Other bacterial infections of unspecified site: Secondary | ICD-10-CM | POA: Diagnosis present

## 2013-02-18 DIAGNOSIS — R7881 Bacteremia: Secondary | ICD-10-CM

## 2013-02-18 DIAGNOSIS — T8140XA Infection following a procedure, unspecified, initial encounter: Principal | ICD-10-CM | POA: Diagnosis present

## 2013-02-18 DIAGNOSIS — R6521 Severe sepsis with septic shock: Secondary | ICD-10-CM | POA: Diagnosis present

## 2013-02-18 DIAGNOSIS — N3289 Other specified disorders of bladder: Secondary | ICD-10-CM | POA: Diagnosis present

## 2013-02-18 DIAGNOSIS — L02519 Cutaneous abscess of unspecified hand: Secondary | ICD-10-CM

## 2013-02-18 DIAGNOSIS — F0390 Unspecified dementia without behavioral disturbance: Secondary | ICD-10-CM

## 2013-02-18 DIAGNOSIS — Z87898 Personal history of other specified conditions: Secondary | ICD-10-CM

## 2013-02-18 DIAGNOSIS — E049 Nontoxic goiter, unspecified: Secondary | ICD-10-CM

## 2013-02-18 DIAGNOSIS — F1010 Alcohol abuse, uncomplicated: Secondary | ICD-10-CM

## 2013-02-18 DIAGNOSIS — R413 Other amnesia: Secondary | ICD-10-CM

## 2013-02-18 DIAGNOSIS — L03119 Cellulitis of unspecified part of limb: Secondary | ICD-10-CM

## 2013-02-18 DIAGNOSIS — J9600 Acute respiratory failure, unspecified whether with hypoxia or hypercapnia: Secondary | ICD-10-CM | POA: Diagnosis present

## 2013-02-18 DIAGNOSIS — Z860100 Personal history of colon polyps, unspecified: Secondary | ICD-10-CM

## 2013-02-18 DIAGNOSIS — J9601 Acute respiratory failure with hypoxia: Secondary | ICD-10-CM

## 2013-02-18 DIAGNOSIS — Z8673 Personal history of transient ischemic attack (TIA), and cerebral infarction without residual deficits: Secondary | ICD-10-CM

## 2013-02-18 DIAGNOSIS — N390 Urinary tract infection, site not specified: Secondary | ICD-10-CM

## 2013-02-18 DIAGNOSIS — K6819 Other retroperitoneal abscess: Secondary | ICD-10-CM

## 2013-02-18 DIAGNOSIS — Z9889 Other specified postprocedural states: Secondary | ICD-10-CM

## 2013-02-18 DIAGNOSIS — Y838 Other surgical procedures as the cause of abnormal reaction of the patient, or of later complication, without mention of misadventure at the time of the procedure: Secondary | ICD-10-CM | POA: Diagnosis present

## 2013-02-18 DIAGNOSIS — Y9210 Unspecified residential institution as the place of occurrence of the external cause: Secondary | ICD-10-CM | POA: Diagnosis present

## 2013-02-18 DIAGNOSIS — A419 Sepsis, unspecified organism: Secondary | ICD-10-CM

## 2013-02-18 DIAGNOSIS — R6520 Severe sepsis without septic shock: Secondary | ICD-10-CM

## 2013-02-18 DIAGNOSIS — J690 Pneumonitis due to inhalation of food and vomit: Secondary | ICD-10-CM

## 2013-02-18 DIAGNOSIS — J4489 Other specified chronic obstructive pulmonary disease: Secondary | ICD-10-CM | POA: Diagnosis present

## 2013-02-18 DIAGNOSIS — R319 Hematuria, unspecified: Secondary | ICD-10-CM

## 2013-02-18 DIAGNOSIS — M726 Necrotizing fasciitis: Secondary | ICD-10-CM

## 2013-02-18 DIAGNOSIS — E870 Hyperosmolality and hypernatremia: Secondary | ICD-10-CM | POA: Diagnosis present

## 2013-02-18 DIAGNOSIS — R5383 Other fatigue: Secondary | ICD-10-CM

## 2013-02-18 HISTORY — DX: Malignant neoplasm of urinary organ, unspecified: C68.9

## 2013-02-18 HISTORY — PX: LAPAROTOMY: SHX154

## 2013-02-18 HISTORY — DX: Unspecified dementia without behavioral disturbance: F03.90

## 2013-02-18 HISTORY — DX: Unspecified dementia, unspecified severity, without behavioral disturbance, psychotic disturbance, mood disturbance, and anxiety: F03.90

## 2013-02-18 LAB — BASIC METABOLIC PANEL
BUN: 125 mg/dL — ABNORMAL HIGH (ref 6–23)
BUN: 63 mg/dL — ABNORMAL HIGH (ref 6–23)
CALCIUM: 8.4 mg/dL (ref 8.4–10.5)
CHLORIDE: 112 meq/L (ref 96–112)
CO2: 22 mEq/L (ref 19–32)
CO2: 20 mEq/L (ref 19–32)
CREATININE: 3.65 mg/dL — AB (ref 0.50–1.35)
Calcium: 7.2 mg/dL — ABNORMAL LOW (ref 8.4–10.5)
Chloride: 97 mEq/L (ref 96–112)
Creatinine, Ser: 1.41 mg/dL — ABNORMAL HIGH (ref 0.50–1.35)
GFR calc Af Amer: 57 mL/min — ABNORMAL LOW (ref >90)
GFR calc non Af Amer: 49 mL/min — ABNORMAL LOW (ref >90)
GFR, EST AFRICAN AMERICAN: 18 mL/min — AB (ref >90)
GFR, EST NON AFRICAN AMERICAN: 16 mL/min — AB (ref >90)
Glucose, Bld: 151 mg/dL — ABNORMAL HIGH (ref 70–99)
Glucose, Bld: 109 mg/dL — ABNORMAL HIGH (ref 70–99)
Potassium: 4.7 mEq/L (ref 3.7–5.3)
Potassium: 3.8 mEq/L (ref 3.7–5.3)
SODIUM: 137 meq/L (ref 137–147)
Sodium: 144 mEq/L (ref 137–147)

## 2013-02-18 LAB — CBC WITH DIFFERENTIAL/PLATELET
Basophils Absolute: 0 10*3/uL (ref 0.0–0.1)
Basophils Relative: 0 % (ref 0–1)
EOS ABS: 0 10*3/uL (ref 0.0–0.7)
EOS PCT: 0 % (ref 0–5)
HEMATOCRIT: 39.9 % (ref 39.0–52.0)
Hemoglobin: 13.9 g/dL (ref 13.0–17.0)
Lymphocytes Relative: 6 % — ABNORMAL LOW (ref 12–46)
Lymphs Abs: 0.9 10*3/uL (ref 0.7–4.0)
MCH: 23.9 pg — ABNORMAL LOW (ref 26.0–34.0)
MCHC: 34.8 g/dL (ref 30.0–36.0)
MCV: 68.7 fL — AB (ref 78.0–100.0)
MONO ABS: 0.7 10*3/uL (ref 0.1–1.0)
Monocytes Relative: 5 % (ref 3–12)
Neutro Abs: 12.8 10*3/uL — ABNORMAL HIGH (ref 1.7–7.7)
Neutrophils Relative %: 89 % — ABNORMAL HIGH (ref 43–77)
Platelets: 141 10*3/uL — ABNORMAL LOW (ref 150–400)
RBC: 5.81 MIL/uL (ref 4.22–5.81)
RDW: 14.2 % (ref 11.5–15.5)
WBC: 14.4 10*3/uL — ABNORMAL HIGH (ref 4.0–10.5)

## 2013-02-18 LAB — URINALYSIS, ROUTINE W REFLEX MICROSCOPIC
GLUCOSE, UA: NEGATIVE mg/dL
KETONES UR: NEGATIVE mg/dL
NITRITE: NEGATIVE
PROTEIN: 30 mg/dL — AB
Specific Gravity, Urine: >1.03 — ABNORMAL HIGH (ref 1.005–1.030)
UROBILINOGEN UA: 0.2 mg/dL (ref 0.0–1.0)
pH: 5 (ref 5.0–8.0)

## 2013-02-18 LAB — GLUCOSE, CAPILLARY: GLUCOSE-CAPILLARY: 112 mg/dL — AB (ref 70–99)

## 2013-02-18 LAB — PROTIME-INR
INR: 1.19 (ref 0.00–1.49)
Prothrombin Time: 14.8 seconds (ref 11.6–15.2)

## 2013-02-18 LAB — URINE MICROSCOPIC-ADD ON

## 2013-02-18 LAB — MRSA PCR SCREENING: MRSA by PCR: NEGATIVE

## 2013-02-18 LAB — LACTIC ACID, PLASMA
LACTIC ACID, VENOUS: 1.4 mmol/L (ref 0.5–2.2)
Lactic Acid, Venous: 2.9 mmol/L — ABNORMAL HIGH (ref 0.5–2.2)

## 2013-02-18 LAB — APTT: aPTT: 30 seconds (ref 24–37)

## 2013-02-18 LAB — STREP PNEUMONIAE URINARY ANTIGEN: Strep Pneumo Urinary Antigen: NEGATIVE

## 2013-02-18 SURGERY — LAPAROTOMY, EXPLORATORY
Anesthesia: General | Site: Abdomen

## 2013-02-18 MED ORDER — PHENYLEPHRINE 40 MCG/ML (10ML) SYRINGE FOR IV PUSH (FOR BLOOD PRESSURE SUPPORT)
PREFILLED_SYRINGE | INTRAVENOUS | Status: AC
  Filled 2013-02-18: qty 10

## 2013-02-18 MED ORDER — SODIUM CHLORIDE 0.9 % IV SOLN
500.0000 mg | Freq: Two times a day (BID) | INTRAVENOUS | Status: DC
  Administered 2013-02-18 – 2013-02-19 (×2): 500 mg via INTRAVENOUS
  Filled 2013-02-18 (×5): qty 0.5

## 2013-02-18 MED ORDER — ACETAMINOPHEN 500 MG PO TABS
1000.0000 mg | ORAL_TABLET | Freq: Once | ORAL | Status: AC
  Filled 2013-02-18: qty 2

## 2013-02-18 MED ORDER — EPHEDRINE SULFATE 50 MG/ML IJ SOLN
INTRAMUSCULAR | Status: AC
  Filled 2013-02-18: qty 1

## 2013-02-18 MED ORDER — MIDAZOLAM HCL 5 MG/5ML IJ SOLN
INTRAMUSCULAR | Status: DC | PRN
  Administered 2013-02-18: 2 mg via INTRAVENOUS

## 2013-02-18 MED ORDER — DEXTROSE 5 % IV SOLN
2.0000 ug/min | INTRAVENOUS | Status: DC
  Filled 2013-02-18: qty 4

## 2013-02-18 MED ORDER — GLYCOPYRROLATE 0.2 MG/ML IJ SOLN
INTRAMUSCULAR | Status: AC
  Filled 2013-02-18: qty 2

## 2013-02-18 MED ORDER — LIDOCAINE HCL (CARDIAC) 20 MG/ML IV SOLN
INTRAVENOUS | Status: AC
  Filled 2013-02-18: qty 5

## 2013-02-18 MED ORDER — SIMETHICONE 40 MG/0.6ML PO SUSP
ORAL | Status: AC
  Filled 2013-02-18: qty 1.2

## 2013-02-18 MED ORDER — SODIUM CHLORIDE 0.9 % IV BOLUS (SEPSIS)
1000.0000 mL | Freq: Once | INTRAVENOUS | Status: AC
  Administered 2013-02-18: 1000 mL via INTRAVENOUS

## 2013-02-18 MED ORDER — LIDOCAINE HCL (CARDIAC) 20 MG/ML IV SOLN
INTRAVENOUS | Status: DC | PRN
  Administered 2013-02-18: 100 mg via INTRAVENOUS

## 2013-02-18 MED ORDER — SODIUM CHLORIDE 0.9 % IJ SOLN
3.0000 mL | Freq: Two times a day (BID) | INTRAMUSCULAR | Status: DC
Start: 2013-02-18 — End: 2013-03-01
  Administered 2013-02-18 – 2013-02-26 (×12): 3 mL via INTRAVENOUS

## 2013-02-18 MED ORDER — SUCCINYLCHOLINE CHLORIDE 20 MG/ML IJ SOLN
INTRAMUSCULAR | Status: AC
  Filled 2013-02-18: qty 1

## 2013-02-18 MED ORDER — ALBUMIN HUMAN 5 % IV SOLN
INTRAVENOUS | Status: DC | PRN
Start: 2013-02-18 — End: 2013-02-19
  Administered 2013-02-18 (×2): via INTRAVENOUS

## 2013-02-18 MED ORDER — IOHEXOL 300 MG/ML  SOLN
50.0000 mL | Freq: Once | INTRAMUSCULAR | Status: AC | PRN
  Administered 2013-02-18: 50 mL via ORAL

## 2013-02-18 MED ORDER — ONDANSETRON HCL 4 MG/2ML IJ SOLN
INTRAMUSCULAR | Status: AC
  Filled 2013-02-18: qty 2

## 2013-02-18 MED ORDER — PROPOFOL 10 MG/ML IV BOLUS
INTRAVENOUS | Status: AC
  Filled 2013-02-18: qty 20

## 2013-02-18 MED ORDER — ALBUTEROL SULFATE HFA 108 (90 BASE) MCG/ACT IN AERS
INHALATION_SPRAY | RESPIRATORY_TRACT | Status: AC
  Filled 2013-02-18: qty 6.7

## 2013-02-18 MED ORDER — ACETAMINOPHEN 650 MG RE SUPP
650.0000 mg | Freq: Four times a day (QID) | RECTAL | Status: DC | PRN
Start: 2013-02-18 — End: 2013-03-02

## 2013-02-18 MED ORDER — ACETAMINOPHEN 325 MG PO TABS
650.0000 mg | ORAL_TABLET | Freq: Four times a day (QID) | ORAL | Status: DC | PRN
  Administered 2013-02-18: 650 mg via ORAL
  Filled 2013-02-18 (×2): qty 2

## 2013-02-18 MED ORDER — ROCURONIUM BROMIDE 50 MG/5ML IV SOLN
INTRAVENOUS | Status: AC
  Filled 2013-02-18: qty 1

## 2013-02-18 MED ORDER — ACETAMINOPHEN 650 MG RE SUPP
RECTAL | Status: AC
  Filled 2013-02-18: qty 2

## 2013-02-18 MED ORDER — NEOSTIGMINE METHYLSULFATE 1 MG/ML IJ SOLN
INTRAMUSCULAR | Status: AC
  Filled 2013-02-18: qty 10

## 2013-02-18 MED ORDER — LACTATED RINGERS IV SOLN
INTRAVENOUS | Status: DC | PRN
  Administered 2013-02-18: 22:00:00 via INTRAVENOUS

## 2013-02-18 MED ORDER — MIDAZOLAM HCL 2 MG/2ML IJ SOLN
INTRAMUSCULAR | Status: AC
  Filled 2013-02-18: qty 2

## 2013-02-18 MED ORDER — ROCURONIUM BROMIDE 100 MG/10ML IV SOLN
INTRAVENOUS | Status: DC | PRN
  Administered 2013-02-18: 20 mg via INTRAVENOUS
  Administered 2013-02-18: 50 mg via INTRAVENOUS

## 2013-02-18 MED ORDER — THIAMINE HCL 100 MG/ML IJ SOLN: 100.0000 mg | Freq: Every day | INTRAMUSCULAR | Status: DC

## 2013-02-18 MED ORDER — FENTANYL CITRATE 0.05 MG/ML IJ SOLN
INTRAMUSCULAR | Status: DC | PRN
  Administered 2013-02-18: 100 ug via INTRAVENOUS
  Administered 2013-02-18: 150 ug via INTRAVENOUS

## 2013-02-18 MED ORDER — INSULIN ASPART 100 UNIT/ML ~~LOC~~ SOLN
0.0000 [IU] | SUBCUTANEOUS | Status: DC
  Administered 2013-02-19 (×2): 1 [IU] via SUBCUTANEOUS
  Administered 2013-02-19 – 2013-02-20 (×3): 2 [IU] via SUBCUTANEOUS
  Administered 2013-02-20 (×2): 1 [IU] via SUBCUTANEOUS
  Administered 2013-02-20 (×2): 2 [IU] via SUBCUTANEOUS
  Administered 2013-02-21 (×5): 1 [IU] via SUBCUTANEOUS
  Administered 2013-02-22: 2 [IU] via SUBCUTANEOUS
  Administered 2013-02-22: 1 [IU] via SUBCUTANEOUS
  Administered 2013-02-22: 2 [IU] via SUBCUTANEOUS
  Administered 2013-02-23 – 2013-02-24 (×5): 1 [IU] via SUBCUTANEOUS
  Administered 2013-02-24: 2 [IU] via SUBCUTANEOUS
  Administered 2013-02-24 – 2013-02-25 (×5): 1 [IU] via SUBCUTANEOUS
  Administered 2013-02-25: 2 [IU] via SUBCUTANEOUS
  Administered 2013-02-26 – 2013-02-27 (×6): 1 [IU] via SUBCUTANEOUS
  Administered 2013-02-27: 3 [IU] via SUBCUTANEOUS
  Administered 2013-02-28 (×3): 1 [IU] via SUBCUTANEOUS

## 2013-02-18 MED ORDER — THIAMINE HCL 100 MG/ML IJ SOLN
100.0000 mg | Freq: Every day | INTRAMUSCULAR | Status: DC
Start: 2013-02-18 — End: 2013-02-24
  Administered 2013-02-19 – 2013-02-23 (×6): 100 mg via INTRAVENOUS
  Filled 2013-02-18 (×7): qty 1

## 2013-02-18 MED ORDER — VANCOMYCIN HCL 10 G IV SOLR
1500.0000 mg | Freq: Once | INTRAVENOUS | Status: AC
  Administered 2013-02-18: 1500 mg via INTRAVENOUS
  Filled 2013-02-18: qty 1500

## 2013-02-18 MED ORDER — ONDANSETRON HCL 4 MG/2ML IJ SOLN: 4.0000 mg | Freq: Four times a day (QID) | INTRAMUSCULAR | Status: DC | PRN

## 2013-02-18 MED ORDER — SUCCINYLCHOLINE CHLORIDE 20 MG/ML IJ SOLN
INTRAMUSCULAR | Status: DC | PRN
  Administered 2013-02-18: 100 mg via INTRAVENOUS

## 2013-02-18 MED ORDER — HEPARIN SODIUM (PORCINE) 5000 UNIT/ML IJ SOLN
5000.0000 [IU] | Freq: Three times a day (TID) | INTRAMUSCULAR | Status: DC
  Filled 2013-02-18 (×2): qty 1

## 2013-02-18 MED ORDER — SODIUM CHLORIDE 0.9 % IV SOLN
INTRAVENOUS | Status: DC
  Administered 2013-02-18 – 2013-02-19 (×2): via INTRAVENOUS
  Administered 2013-02-19: 150 mL/h via INTRAVENOUS
  Administered 2013-02-20: 08:00:00 via INTRAVENOUS
  Administered 2013-02-26: 10 mL/h via INTRAVENOUS

## 2013-02-18 MED ORDER — PHENYLEPHRINE HCL 10 MG/ML IJ SOLN
INTRAMUSCULAR | Status: AC
  Filled 2013-02-18: qty 1

## 2013-02-18 MED ORDER — PIPERACILLIN-TAZOBACTAM IN DEX 2-0.25 GM/50ML IV SOLN
2.2500 g | Freq: Four times a day (QID) | INTRAVENOUS | Status: DC
  Administered 2013-02-18: 2.25 g via INTRAVENOUS
  Filled 2013-02-18 (×5): qty 50

## 2013-02-18 MED ORDER — CLINDAMYCIN PHOSPHATE 900 MG/50ML IV SOLN
900.0000 mg | Freq: Four times a day (QID) | INTRAVENOUS | Status: DC
  Administered 2013-02-18 – 2013-02-20 (×7): 900 mg via INTRAVENOUS
  Filled 2013-02-18 (×11): qty 50

## 2013-02-18 MED ORDER — BIOTENE DRY MOUTH MT LIQD
15.0000 mL | Freq: Two times a day (BID) | OROMUCOSAL | Status: DC
  Administered 2013-02-18: 15 mL via OROMUCOSAL

## 2013-02-18 MED ORDER — DEXTROSE 5 % IV SOLN
1.0000 g | Freq: Once | INTRAVENOUS | Status: AC
  Administered 2013-02-18: 1 g via INTRAVENOUS
  Filled 2013-02-18: qty 10

## 2013-02-18 MED ORDER — PROPOFOL 10 MG/ML IV BOLUS
INTRAVENOUS | Status: DC | PRN
  Administered 2013-02-18: 100 mg via INTRAVENOUS

## 2013-02-18 MED ORDER — VANCOMYCIN HCL IN DEXTROSE 750-5 MG/150ML-% IV SOLN
750.0000 mg | INTRAVENOUS | Status: DC
  Administered 2013-02-19: 750 mg via INTRAVENOUS
  Filled 2013-02-18 (×2): qty 150

## 2013-02-18 MED ORDER — ACETAMINOPHEN 650 MG RE SUPP
975.0000 mg | Freq: Once | RECTAL | Status: AC
  Administered 2013-02-18: 975 mg via RECTAL

## 2013-02-18 MED ORDER — PANTOPRAZOLE SODIUM 40 MG IV SOLR
40.0000 mg | INTRAVENOUS | Status: DC
  Administered 2013-02-19 – 2013-02-23 (×6): 40 mg via INTRAVENOUS
  Filled 2013-02-18 (×7): qty 40

## 2013-02-18 MED ORDER — ONDANSETRON HCL 4 MG PO TABS: 4.0000 mg | ORAL_TABLET | Freq: Four times a day (QID) | ORAL | Status: DC | PRN

## 2013-02-18 MED ORDER — FOLIC ACID 5 MG/ML IJ SOLN: 1.0000 mg | Freq: Every day | INTRAMUSCULAR | Status: DC

## 2013-02-18 MED ORDER — PHENYLEPHRINE HCL 10 MG/ML IJ SOLN
30.0000 ug/min | INTRAVENOUS | Status: DC
  Filled 2013-02-18: qty 1

## 2013-02-18 MED ORDER — FOLIC ACID 5 MG/ML IJ SOLN
1.0000 mg | Freq: Every day | INTRAMUSCULAR | Status: DC
Start: 2013-02-18 — End: 2013-02-24
  Administered 2013-02-19 – 2013-02-23 (×6): 1 mg via INTRAVENOUS
  Filled 2013-02-18 (×7): qty 0.2

## 2013-02-18 MED ORDER — ALBUTEROL SULFATE HFA 108 (90 BASE) MCG/ACT IN AERS
INHALATION_SPRAY | RESPIRATORY_TRACT | Status: DC | PRN
  Administered 2013-02-18: 2 via RESPIRATORY_TRACT

## 2013-02-18 MED ORDER — FENTANYL CITRATE 0.05 MG/ML IJ SOLN
INTRAMUSCULAR | Status: AC
  Filled 2013-02-18: qty 5

## 2013-02-18 SURGICAL SUPPLY — 50 items
BLADE SURG ROTATE 9660 (MISCELLANEOUS) ×4 IMPLANT
BNDG GAUZE ELAST 4 BULKY (GAUZE/BANDAGES/DRESSINGS) ×2 IMPLANT
CANISTER SUCTION 2500CC (MISCELLANEOUS) ×3 IMPLANT
CHLORAPREP W/TINT 26ML (MISCELLANEOUS) ×3 IMPLANT
COVER MAYO STAND STRL (DRAPES) ×2 IMPLANT
COVER SURGICAL LIGHT HANDLE (MISCELLANEOUS) ×3 IMPLANT
DRAIN CHANNEL 19F RND (DRAIN) ×2 IMPLANT
DRAIN PENROSE 1X12 LTX STRL (DRAIN) ×4 IMPLANT
DRAPE LAPAROSCOPIC ABDOMINAL (DRAPES) ×3 IMPLANT
DRAPE PROXIMA HALF (DRAPES) IMPLANT
DRAPE UTILITY 15X26 W/TAPE STR (DRAPE) ×6 IMPLANT
DRAPE WARM FLUID 44X44 (DRAPE) ×3 IMPLANT
DRSG OPSITE POSTOP 4X10 (GAUZE/BANDAGES/DRESSINGS) IMPLANT
DRSG OPSITE POSTOP 4X8 (GAUZE/BANDAGES/DRESSINGS) IMPLANT
ELECT BLADE 6.5 EXT (BLADE) ×2 IMPLANT
ELECT CAUTERY BLADE 6.4 (BLADE) ×6 IMPLANT
ELECT REM PT RETURN 9FT ADLT (ELECTROSURGICAL) ×3
ELECTRODE REM PT RTRN 9FT ADLT (ELECTROSURGICAL) ×1 IMPLANT
EVACUATOR SILICONE 100CC (DRAIN) ×2 IMPLANT
GLOVE BIO SURGEON STRL SZ7 (GLOVE) ×7 IMPLANT
GLOVE BIOGEL PI IND STRL 7.5 (GLOVE) ×1 IMPLANT
GLOVE BIOGEL PI INDICATOR 7.5 (GLOVE) ×4
GLOVE SURG SS PI 6.0 STRL IVOR (GLOVE) ×2 IMPLANT
GOWN STRL NON-REIN LRG LVL3 (GOWN DISPOSABLE) ×9 IMPLANT
KIT BASIN OR (CUSTOM PROCEDURE TRAY) ×3 IMPLANT
KIT ROOM TURNOVER OR (KITS) ×3 IMPLANT
LIGASURE IMPACT 36 18CM CVD LR (INSTRUMENTS) IMPLANT
NS IRRIG 1000ML POUR BTL (IV SOLUTION) ×6 IMPLANT
PACK GENERAL/GYN (CUSTOM PROCEDURE TRAY) ×3 IMPLANT
PAD ABD 8X10 STRL (GAUZE/BANDAGES/DRESSINGS) ×2 IMPLANT
PAD ARMBOARD 7.5X6 YLW CONV (MISCELLANEOUS) ×3 IMPLANT
PENCIL BUTTON HOLSTER BLD 10FT (ELECTRODE) ×2 IMPLANT
SPECIMEN JAR LARGE (MISCELLANEOUS) IMPLANT
SPONGE GAUZE 4X4 12PLY (GAUZE/BANDAGES/DRESSINGS) ×2 IMPLANT
SPONGE LAP 18X18 X RAY DECT (DISPOSABLE) IMPLANT
STAPLER VISISTAT 35W (STAPLE) ×3 IMPLANT
SUCTION POOLE TIP (SUCTIONS) ×3 IMPLANT
SUT ETHILON 2 0 FS 18 (SUTURE) ×4 IMPLANT
SUT PDS AB 1 TP1 96 (SUTURE) ×6 IMPLANT
SUT PROLENE 2 0 CT 1 (SUTURE) ×2 IMPLANT
SUT SILK 2 0 SH CR/8 (SUTURE) ×3 IMPLANT
SUT SILK 2 0 TIES 10X30 (SUTURE) ×3 IMPLANT
SUT SILK 3 0 SH CR/8 (SUTURE) ×3 IMPLANT
SUT SILK 3 0 TIES 10X30 (SUTURE) ×3 IMPLANT
SUT VIC AB 3-0 SH 18 (SUTURE) IMPLANT
TOWEL OR 17X26 10 PK STRL BLUE (TOWEL DISPOSABLE) ×3 IMPLANT
TRAY FOLEY CATH 16FRSI W/METER (SET/KITS/TRAYS/PACK) IMPLANT
TUBE CONNECTING 12'X1/4 (SUCTIONS)
TUBE CONNECTING 12X1/4 (SUCTIONS) IMPLANT
YANKAUER SUCT BULB TIP NO VENT (SUCTIONS) IMPLANT

## 2013-02-18 NOTE — Progress Notes (Signed)
PT HAS NOW LEFT VIA [facility] TO TRANSFER TO [facility] ICU. O2 AT 3L/MIN VIA [place]. BOTH IV'S PATENT. FOLEY CATH PATENT.

## 2013-02-18 NOTE — H&P (Signed)
Name: Jason [name] MRN: [id number] DOB: 09-Nov-1945    ADMISSION DATE:  02/18/2013 CONSULTATION DATE:  02/18/2013  REFERRING MD :  [facility], Dr. Sarajane Jews PRIMARY SERVICE: PCCM  CHIEF COMPLAINT:  Sepsis  BRIEF PATIENT DESCRIPTION: 68 yo AAF s/p TURP procedure, [facility] after pulling out his foley catheter. Initial lab work revealed ARF, UTI, suspected aspiration pneumonia, and sepsis. He was febrile and hypoxic, with labs and symptoms of sepsis. CT scan ab/pelvis discussed w/ evidence of soft tissue infection in the pelvis with extension to the fascia of the rectus muscles and of the left internal obturator muscle consistent with necrotizing fasciitis. No visible pus collections in the pelvis as well as extensive bilateral lower lobe pneumonia. Blood pressures became soft, but pt responded well to 3L IVF.   SIGNIFICANT EVENTS / STUDIES:  2/18 CT abd/pelvis w/o contrast:  Evidence consistent with anaerobic soft tissue infection in the pelvis with extension to the fascia of the rectus muscles and of the left internal obturator muscle consistent with necrotizing fasciitis. No visible pus collections in the pelvis.  Extensive bilateral lower lobe pneumonia.   2/18 US Renal: No hydronephrosis.  2/18 CXR: Bibasilar opacities question pneumonia versus aspiration.  Potential postoperative ileus.  Bubbly gas extending farther left laterally in the inferior pelvis than is typically seen with stool, question prominent stool versus extraperitoneal extraluminal gas in patient who has had a recent cystoscopy and TURBT   LINES / TUBES: 2/18 Foley Cath>> 2/18 PIV>> 2/18  IJ>>>  CULTURES: 2/18 blood culture>> 2/18 urine culture>>   ANTIBIOTICS: mero 2/18>>> vanc 2/18>>> clinda 2/18>>>  HISTORY OF PRESENT ILLNESS: 68 yo AAF s/p TURBT and cystoscopy procedure, discharged 2/13, [facility] after pulling out his foley catheter. Initial lab work revealed ARF, UTI, suspected  aspiration pneumonia, and sepsis. He was febrile and hypoxic, with labs and symptoms of sepsis. CT scan ab/pelvis discussed w/ evidence of soft tissue infection in the pelvis with extension to the fascia of the rectus muscles and of the left internal obturator muscle consistent with necrotizing fasciitis. No visible pus collections in the pelvis as well as extensive bilateral lower lobe pneumonia. Blood pressures became soft, but pt responded well to 3L IVF.     PAST MEDICAL HISTORY :  Past Medical History  Diagnosis Date  . Hypertension   . Stroke     Dementia, otherwise, no residual  . Bladder mass 02-10-13    surgery planned for this  . 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 COPD (chronic obstructive pulmonary disease)     previous history and current smoking  . 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 Dementia 02/18/2013   Past Surgical History  Procedure Laterality Date  . Transurethral resection of bladder tumor with gyrus (turbt-gyrus) N/A 02/13/2013    Procedure: TRANSURETHRAL RESECTION OF BLADDER TUMOR WITH GYRUS (TURBT-GYRUS), bladder biopsies;  Surgeon: Ardis Hughs, MD;  Location: [facility];  Service: Urology;  Laterality: N/A;  . Cystoscopy/retrograde/ureteroscopy Bilateral 02/13/2013    Procedure: BILATERAL RETROGRADE;  Surgeon: Ardis Hughs, MD;  Location: [facility];  Service: Urology;  Laterality: Bilateral;   Prior to Admission medications   Medication Sig Start Date End Date Taking? Authorizing Provider  acetaminophen-codeine (TYLENOL #3) 300-30 MG per tablet Take 1 tablet by mouth every 4 (four) hours as needed. 02/13/13  Yes Ardis Hughs, MD  benazepril (LOTENSIN) 20 MG tablet Take 20 mg by mouth every morning.  Yes Historical Provider, MD  donepezil (ARICEPT) 10 MG tablet Take 10 mg by mouth at bedtime.   Yes Historical Provider, MD  aspirin EC 81 MG tablet Take 81 mg by mouth daily.    Historical Provider, MD  cetirizine (ZYRTEC) 10 MG  tablet Take 10 mg by mouth daily.    Historical Provider, MD  docusate sodium (COLACE) 100 MG capsule Take 1 capsule (100 mg total) by mouth 2 (two) times daily as needed (take to keep stool soft.). 02/13/13   Ardis Hughs, MD  Multiple Vitamin (MULTIVITAMIN WITH MINERALS) TABS tablet Take 1 tablet by mouth daily.    Historical Provider, MD   No Known Allergies  FAMILY HISTORY:  No family history on file. SOCIAL HISTORY:  reports that he has been smoking.  He does not have any smokeless tobacco history on file. He reports that he drinks alcohol. He reports that he does not use illicit drugs.  REVIEW OF SYSTEMS:  Unable to obtain, dementia.   SUBJECTIVE: pt appears tender over sacrum. BP soft.   VITAL SIGNS: Temp:  [97.6 F (36.4 C)-102.3 F (39.1 C)] 101.6 F (38.7 C) (02/18 1803) Pulse Rate:  [31-120] 95 (02/18 1803) Resp:  [18-29] 21 (02/18 1803) BP: (90-112)/(43-61) 93/44 mmHg (02/18 1803) SpO2:  [81 %-97 %] 85 % (02/18 1600) Weight:  [149 lb 7.6 oz (67.8 kg)-165 lb (74.844 kg)] 149 lb 7.6 oz (67.8 kg) (02/18 1019) HEMODYNAMICS:   VENTILATOR SETTINGS:   INTAKE / OUTPUT: Intake/Output     02/18 0701 - 02/19 0700   P.O. 1006   I.V. (mL/kg) 1350 (19.9)   Other 1000   IV Piggyback 150   Total Intake(mL/kg) 3506 (51.7)   Urine (mL/kg/hr) 650 (0.8)   Total Output 650   Net +2856       Stool Occurrence 1 x    BP 109/56  Pulse 92  Temp(Src) 98.3 F (36.8 C) (Oral)  Resp 27  Ht 5\' 10"  (1.778 m)  Wt 149 lb 7.6 oz (67.8 kg)  BMI 21.45 kg/m2  SpO2 95%  PHYSICAL EXAMINATION: Gen: no distress HEENT: AT. .   CV: RRR s1 s2  No m Chest: CTAB, no wheeze or crackles Abd: Soft., tender left lower quad, no r/g, area on skin blackening, not escar, not tener  Ext: No edema.  Skin: see abdo Neuro: pleasant, confused, non focal, moves all ext   LABS:  CBC  Recent Labs Lab 02/18/13 0647  WBC 14.4*  HGB 13.9  HCT 39.9  PLT 141*   Coag's No results found for  this basename: APTT, INR,  in the last 168 hours BMET  Recent Labs Lab 02/18/13 0647  NA 137  K 4.7  CL 97  CO2 22  BUN 125*  CREATININE 3.65*  GLUCOSE 151*   Electrolytes  Recent Labs Lab 02/18/13 0647  CALCIUM 8.4   Sepsis Markers  Recent Labs Lab 02/18/13 0824  LATICACIDVEN 2.9*   ABG No results found for this basename: PHART, PCO2ART, PO2ART,  in the last 168 hours Liver Enzymes No results found for this basename: AST, ALT, ALKPHOS, BILITOT, ALBUMIN,  in the last 168 hours Cardiac Enzymes No results found for this basename: TROPONINI, PROBNP,  in the last 168 hours Glucose No results found for this basename: GLUCAP,  in the last 168 hours  Imaging Ct Abdomen Pelvis Wo Contrast  02/18/2013   CLINICAL DATA:  Fever. Recent transurethral resection of bladder tumor. Extraluminal gas collection in the left  side of the pelvis.  EXAM: CT ABDOMEN AND PELVIS WITHOUT CONTRAST  TECHNIQUE: Multidetector CT imaging of the abdomen and pelvis was performed following the standard protocol without intravenous contrast.  COMPARISON:  Radiograph dated 02/18/2013 and CT scan dated 08/30/2006  FINDINGS: There are bilateral extensive lower lobe consolidative pulmonary infiltrates. No effusions. Heart size is normal. Tiny hiatal hernia.  There is extensive extraluminal air in the soft tissues of the pelvis including the presacral space and along the left side of the pelvis along the fascia of the left antral operator muscle as well as extending anteriorly along the deep fascia of the rectus muscles. This is consistent with and anaerobic infection and possible necrotizing fasciitis.  The liver, biliary tree, spleen, pancreas, adrenal glands, and kidneys demonstrate no significant abnormality. The bowel appears normal. Foley catheter is in place in the bladder with a small amount of air in the bladder.  IMPRESSION: 1. Evidence consistent with anaerobic soft tissue infection in the pelvis with  extension to the fascia of the rectus muscles and of the left internal obturator muscle consistent with necrotizing fasciitis. No visible pus collections in the pelvis. 2. Extensive bilateral lower lobe pneumonia.  Critical Value/emergent results were called by telephone at the time of interpretation on 02/18/2013 at 3:55 PM to Dr. Brendia Sacks , who verbally acknowledged these results.   Electronically Signed   By: Geanie Cooley M.D.   On: 02/18/2013 15:56   US Renal  02/18/2013   CLINICAL DATA:  Acute renal failure  EXAM: RENAL/URINARY TRACT ULTRASOUND COMPLETE  COMPARISON:  01/13/2013  FINDINGS: Right Kidney:  Length: 11.2 cm.  No mass or hydronephrosis.  Left Kidney:  Length: 11.0 cm.  No mass or hydronephrosis.  Bladder:  Poorly visualized/decompressed by indwelling Foley catheter.  IMPRESSION: No hydronephrosis.  Bladder poorly visualized/decompressed by indwelling Foley catheter.   Electronically Signed   By: Charline Bills M.D.   On: 02/18/2013 13:36   Dg Chest Portable 1 View  02/18/2013   CLINICAL DATA:  Hypoxia, COPD  EXAM: PORTABLE CHEST - 1 VIEW  COMPARISON:  01/27/2013  FINDINGS: Cardiac shadow is stable. Bilateral basilar infiltrates are now seen new from the prior exam. The bony structures are within normal limits.  IMPRESSION: Bibasilar infiltrates.   Electronically Signed   By: Alcide Clever M.D.   On: 02/18/2013 08:28   Dg Abd 2 Views  02/18/2013   CLINICAL DATA:  Bladder tumor, urothelial carcinoma, possible vomiting, post TURBT 02/13/2013  EXAM: ABDOMEN - 2 VIEW  COMPARISON:  None  FINDINGS: Bibasilar infiltrates question pneumonia versus aspiration.  Air-filled nondistended small bowel loops the mid abdomen, question postoperative ileus.  Bubbly gas identified in the pelvis, could be related to stool within the distal colon but extending farther left lateral than is typically seen with rectal vault stool, raising question of extraperitoneal gas in the left pelvis.  Bones  unremarkable.  No urinary tract calcification.  No definite free intraperitoneal air.  IMPRESSION: Bibasilar opacities question pneumonia versus aspiration.  Potential postoperative ileus.  Bubbly gas extending farther left laterally in the inferior pelvis than is typically seen with stool, question prominent stool versus extraperitoneal extraluminal gas in patient who has had a recent cystoscopy and TURBT ; CT imaging of the abdomen and pelvis with IV and oral contrast recommended.  Findings called to Dr. Irene Limbo on 02/18/2013 at 1315 hr.   Electronically Signed   By: Ulyses Southward M.D.   On: 02/18/2013 13:16   CXR:  Rt base infiltrate, no ett  ASSESSMENT / PLAN:  PULMONARY A: PNA, r/o aspiration vs HCAP P:   pcxr noted No distress Consider keep intubated if to OR See ID  CARDIOVASCULAR A: Severe sepsis, likely septic shock  P:  Repeat lactic acid Add levophed if MAP less 65, does not require as of now Cortisol  Place line, assess cvp Goal cvp 10  RENAL A:  ARF, ATN likely frmo septic shock P:   bmet now and then q12h Lactic acid repeat volume  GASTROINTESTINAL A:  NPO, necrotizing pelvic infection, see ID P:   lft ppi npo  HEMATOLOGIC A:  dvt prevention P:  scd Consider sub q heparin Assess coags for possible OR  INFECTIOUS A:  Necrotizing pelvic infection s/p bladder surgery, likely related to bladder as source, HCAP vs aspiration P:   Surgery to see and called Urology Place line clinda for toxin inhibition Mero, vanc BC sputum  ENDOCRINE A:  R/o rel AI P:   Cortisol Low threshold stress roids ssi  NEUROLOGIC A:  Dementia, pain P:   May need prn fent No benzo  TODAY'S SUMMARY: Septic shock , place line, needs emergent surgical evaluation, lactic repeat  02/18/2013, 7:47 PM  Ccm time 40 min   I have fully examined this patient and agree with above findings.    And edited in full  Lavon Paganini. Titus Mould, MD, [place] Pgr: [place]  [facility]

## 2013-02-18 NOTE — ED Notes (Signed)
Pt from home via ems, states he accidentally pulled his foley cath out this am when he got it caught on the chair.

## 2013-02-18 NOTE — Clinical Social Work Psychosocial (Signed)
Clinical Social Work Department BRIEF PSYCHOSOCIAL ASSESSMENT 02/18/2013  Patient:  Jason [name], Jason [name]     Account Number:  [id number]     Admit date:  02/18/2013  Clinical Social Worker:  Edwyna Shell, [place]  Date/Time:  02/18/2013 10:00 AM  Referred by:  Physician  Date Referred:  02/18/2013 Referred for  Other - See comment   Other Referral:   Participant in [facility]   Interview type:  Family Other interview type:   Also spoke w [facility] SW Raquel Sarna or [place]) and [facility] RN Levada Dy)    PSYCHOSOCIAL DATA Living Status:  FAMILY Admitted from facility:   Level of care:   Primary support name:  Cathlean Cower Primary support relationship to patient:  SIBLING Degree of support available:   Per [facility] SW and RN, Cathlean Cower is "very good caregiver" although she is very hard of hearing and often needs loud speaking voice and repetition to understand what is needed for care of patient    CURRENT CONCERNS Current Concerns  Other - See comment   Other Concerns:    SOCIAL WORK ASSESSMENT / PLAN CSW was unable to assess patient directly, patient not able to speak at present per RN.  CSW spoke w sister on Rudene Re.  Confirmed that patient lives w sister who is his caregiver, does not live in group home. CSW received call from [place], [place] at [facility], who provided background information.  Patient has been participant at [facility] for past 3.5 years, is taken by Lucianne Lei to [facility] on his assigned days for group care (Tu, Th, Friday). Does receive home health visits periodically from [facility] Levada Dy).  Per Levada Dy, patient has been completely ambulatory at home, does not use cane or walker.  Is able to get up, toilet and take care of his needs fairly indepedently.  Patient is perceived as able to be left alone for short periods of time "we can drop him off at home without supervision for short time."  Last treatment team family meeting in January 2015 discussed patient's  increasing caregiving needs and increased need for cueing w ADLs as well as increased fatigue.    Per [facility] RN, patient had MMSE in July 2014, scored 14.  Says his dementia is "mild" - patient was able to understand his new diagnosis of cancer.  Does not use alcohol at present, per [facility] RN he cut down his drinking several years ago.  At present, only drinks occasionally and not to excess at present.    HH RN says that sister is a "very good caregiver", is attentive to patient needs and will follow whatever directions are given.  She is "very hard of hearing" and may not fully understand verbal instructions unless they are given w a loud voice and repeated until she can verbalize understanding.  Brother who lives next door is not hard of hearing, sister requested that hospital communicate w brother who will relay messages to sister in person.    Per [facility] RN, patient does not have healthcare power of attorney.  Sister has made most of decisions needed for patient and appears to have his best interest in mind.    [facility] will provide [facility] and day care services at discharge, asked that we communicate w them re discharge needs.   Assessment/plan status:  Psychosocial Support/Ongoing Assessment of Needs Other assessment/ plan:   Information/referral to community resources:   [facility]  PATIENT'S/FAMILY'S RESPONSE TO PLAN OF CARE: Unable to assess patient, sister expressed thanks for call, [facility] appreciative of information to assist w discharge needs for patient.  [facility] - [phone]        Edwyna Shell, LCSW Clinical Social Worker [phone])

## 2013-02-18 NOTE — Anesthesia Preprocedure Evaluation (Addendum)
Anesthesia Evaluation  Patient identified by MRN, date of birth, ID band Patient confused    Reviewed: Allergy & Precautions  History of Anesthesia Complications Negative for: history of anesthetic complications  Airway Mallampati: II TM Distance: >3 FB Neck ROM: Full    Dental  (+) Poor Dentition, Dental Advisory Given   Pulmonary pneumonia -, unresolved, COPDCurrent Smoker,  + rhonchi         Cardiovascular hypertension, Rhythm:Regular Rate:Tachycardia     Neuro/Psych Dementia CVA negative psych ROS   GI/Hepatic negative GI ROS, (+)     substance abuse  alcohol use,   Endo/Other  Hyperthyroidism   Renal/GU ARFRenal disease     Musculoskeletal   Abdominal   Peds  Hematology   Anesthesia Other Findings   Reproductive/Obstetrics                         Anesthesia Physical Anesthesia Plan  ASA: IV and emergent  Anesthesia Plan: General   Post-op Pain Management:    Induction: Intravenous, Rapid sequence and Cricoid pressure planned  Airway Management Planned: Oral ETT  Additional Equipment:   Intra-op Plan:   Post-operative Plan: Post-operative intubation/ventilation  Informed Consent: I have reviewed the patients History and Physical, chart, labs and discussed the procedure including the risks, benefits and alternatives for the proposed anesthesia with the patient or authorized representative who has indicated his/her understanding and acceptance.   Dental advisory given and History available from chart only  Plan Discussed with: CRNA, Anesthesiologist and Surgeon  Anesthesia Plan Comments:        Anesthesia Quick Evaluation

## 2013-02-18 NOTE — Progress Notes (Signed)
CT ab/pelvis discussed with Dr. Zigmund Rin Gorton, shows evidence of soft tissue infection in the pelvis with extension to the fascia of the rectus muscles and of the left internal obturator muscle consistent with necrotizing fasciitis. No visible pus collections in the pelvis as well as extensive bilateral lower lobe pneumonia.  Patient reexamined. Tm 101.5 currently afebrile. SBP 90-112. HR 94.  Respiratory rate 20s.  He appears calm, comfortable. He appears nontoxic. Speech is fluent and clear. His abdomen is soft with mild generalized tenderness. The skin over the left side of his abdomen now has some discoloration lateral to the umbilicus. The penis, scrotum, perineum appear normal. The groin appears unremarkable. He now appears to have some blackening of skin superior to gluteal cleft.   A/P 68 year old man presented to the emergency department after accidentally pulling out his Foley catheter. Initial laboratory workup revealed acute renal failure, UTI, suspected aspiration pneumonia, suspected sepsis. Although abdominal exam was benign, imaging was obtained because of history of vomiting which revealed necrotizing fasciitis.  1. Necrotizing fasciitis of the abdominal wall extending down into the pelvis without evidence of abscess. We will change to Merrem and Clinda, continue vanc. Patient is NPO and has not had any heparin products. 2. Sepsis. 3. Extensive bilateral lower lobe pneumonia. 4. UTI. 5. Acute renal failure. 6. New diagnosis of UROTHELIAL CARCINOMA in last week  Discussed with surgery Dr. Arnoldo Morale. Recommends transfer to higher level of care.  Discussed with Dr. Verita Lamb general surgery. Will see in consult on arrival. Discussed with CCM Dr. Lake Bells accepts to [place].  Patient is critically ill but currently hemodynamically stable. Above discussed with sister by telephone (Charles/brother phone unanswered and no voicemail) we discussed that the patient is critically ill  and requires transfer to a higher level of care for surgical evaluation emergently. Prognosis guarded.  Time coordinating care, discussion with physicians 90 minutes in addition to admitting time.  Murray Hodgkins, MD [facility] [phone]

## 2013-02-18 NOTE — Progress Notes (Signed)
Pt to radiology for testing.

## 2013-02-18 NOTE — Progress Notes (Signed)
UR chart review completed.  

## 2013-02-18 NOTE — ED Notes (Signed)
Bed linens changed. Small amount of bright red blood noticed around meatus and cleaned. Skin breakdown to sacral area noticed.

## 2013-02-18 NOTE — Clinical Social Work Note (Signed)
CSW received referral, but no documented reason. Reviewed chart as well. Spoke with EDP. Pt to be admitted per EDP. CSW will sign off, but can be reconsulted if needed.  Benay Pike, [place]

## 2013-02-18 NOTE — Op Note (Signed)
Preoperative diagnosis: Necrotizing fasciitis and possible bladder perforation Postoperative diagnosis: Necrotizing fasciitis and extraperitoneal bladder perforation Surgery: Exploration of bladder perforation; irrigation and insertion of drain Surgeon: Dr. Nicki Reaper Mykelle Cockerell Assistant: Dr. Verita Lamb  The patient has the above diagnoses and consented to the above procedure.. Dr Georgette Dover performed a laparotomy and dictated the case. I assisted him and helped with patient preparation/positioning. Extra care was taken to minimize the risk of compartment syndrome and neuropathy and deep vein thrombosis.  Dr. Georgette Dover evacuated and irrigated and extraperitoneal pelvic abscess on the left and somewhat on the right side of the bladder. He remove some necrotic tissue as dictated. The bladder was very thickened and inflamed. There was no obvious bladder perforation. I could feel the Foley balloon deep in the pelvis. Inflammation and tissue planes were such that I did not want to mobilize the complete retropubic space causing injury/bleeding.  Near the end of the case I wanted to check for an occult bladder perforation. With good exposure I could visualize the left and then right retropubic spaces. Initially I instilled approximately 100 cc of normal saline into the bladder and it partially filled with no leak extravasation. With further inspection both Dr. Georgette Dover and I could see a little bit of fluid leaking out of the left bladder sidewall just deep in the pelvis. There was a tremendous amount of inflammation and thickening of the bladder and surrounding tissues and friable tissues. There was no question that the tissues would not be easily approximated with suture. I felt that sharp or finger dissection deep in the pelvis would threaten the size of the opening in the bladder and make the perforation even more difficult to manage.  Recognizing its location and local tissue findings we both decided not to try to close  the bladder. Preoperatively the urine was draining nicely through the catheter. A 20 French catheter was draining well intraoperatively. A large Penrose drain was placed in the left retropubic space and brought out through a separate stab incision and sutured in place with 2-0 non-absorbable suture  Dr Georgette Dover closed the abdomen and patient will be managed in the unit post op

## 2013-02-18 NOTE — Procedures (Signed)
Central Venous Catheter Insertion Procedure Note JAYQUON THEILER [id number] Jul 19, 1945  Procedure: Insertion of Central Venous Catheter Indications: Assessment of intravascular volume, Drug and/or fluid administration and Frequent blood sampling  Procedure Details Consent: Risks of procedure as well as the alternatives and risks of each were explained to the (patient/caregiver).  Consent for procedure obtained. Time Out: Verified patient identification, verified procedure, site/side was marked, verified correct patient position, special equipment/implants available, medications/allergies/relevent history reviewed, required imaging and test results available.  Performed  Maximum sterile technique was used including antiseptics, cap, gloves, gown, hand hygiene, mask and sheet. Skin prep: Chlorhexidine; local anesthetic administered A antimicrobial bonded/coated triple lumen catheter was placed in the right internal jugular vein using the Seldinger technique.  Evaluation Blood flow good Complications: No apparent complications Patient did tolerate procedure well. Chest X-ray ordered to verify placement.  CXR: pending.  Raylene Miyamoto 02/18/2013, 8:38 PM   US guidance  Lavon Paganini. Titus Mould, MD, [place] Pgr: [place] [facility]

## 2013-02-18 NOTE — Progress Notes (Signed)
PT BEING TRANSFERRED TO [place] TO 2MID/WEST O3. TRANSFER REPORT CALLED TO RN RECEIVING PT.. UNABLE TO REACH PT'S SISTER,BUT DID SPEAK TO PT'S DAUGHTER.

## 2013-02-18 NOTE — ED Provider Notes (Signed)
CSN: [id number]     Arrival date & time 02/18/13  3329 History   First MD Initiated Contact with Patient 02/18/13 [phone]     Chief Complaint  Patient presents with  . Pulled Foley out      (Consider location/radiation/quality/duration/timing/severity/associated sxs/prior Treatment) HPI Level 5 Caveat: dementia. This is a 68 year old male who underwent transurethral resection of a bladder mass on the 13th of this month. A 20 French Foley catheter was left in place postoperatively. This morning he was getting out of a chair and accidentally pulled the Foley catheter out. There was urethral bleeding at home but this was controlled by EMS prior to transport. The patient is able to deny that he is in pain. He is minimally conversant.  Past Medical History  Diagnosis Date  . Hypertension   . Stroke     Dementia, otherwise, no residual  . Bladder mass 02-10-13    surgery planned for this  . 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 COPD (chronic obstructive pulmonary disease)     previous history and current smoking   Past Surgical History  Procedure Laterality Date  . Transurethral resection of bladder tumor with gyrus (turbt-gyrus) N/A 02/13/2013    Procedure: TRANSURETHRAL RESECTION OF BLADDER TUMOR WITH GYRUS (TURBT-GYRUS), bladder biopsies;  Surgeon: Ardis Hughs, MD;  Location: [facility];  Service: Urology;  Laterality: N/A;  . Cystoscopy/retrograde/ureteroscopy Bilateral 02/13/2013    Procedure: BILATERAL RETROGRADE;  Surgeon: Ardis Hughs, MD;  Location: [facility];  Service: Urology;  Laterality: Bilateral;   No family history on file. History  Substance Use Topics  . Smoking status: Current Every Day Smoker  . Smokeless tobacco: Not on file  . Alcohol Use: Yes    Review of Systems  Unable to perform ROS     Allergies  Review of patient's allergies indicates no known allergies.  Home Medications   Current Outpatient Rx  Name  Route  Sig   Dispense  Refill  . acetaminophen-codeine (TYLENOL #3) 300-30 MG per tablet   Oral   Take 1 tablet by mouth every 4 (four) hours as needed.   20 tablet   0   . aspirin EC 81 MG tablet   Oral   Take 81 mg by mouth daily.         . benazepril (LOTENSIN) 20 MG tablet   Oral   Take 20 mg by mouth every morning.         . cetirizine (ZYRTEC) 10 MG tablet   Oral   Take 10 mg by mouth daily.         Marland Kitchen docusate sodium (COLACE) 100 MG capsule   Oral   Take 1 capsule (100 mg total) by mouth 2 (two) times daily as needed (take to keep stool soft.).   60 capsule   0   . donepezil (ARICEPT) 10 MG tablet   Oral   Take 10 mg by mouth at bedtime.         . Multiple Vitamin (MULTIVITAMIN WITH MINERALS) TABS tablet   Oral   Take 1 tablet by mouth daily.          BP 98/48  Pulse 116  Temp(Src) 97.6 F (36.4 C) (Oral)  Resp 18  Ht 5' 9.5" (1.765 m)  Wt 165 lb (74.844 kg)  BMI 24.03 kg/m2  SpO2 97%  Physical Exam General: Well-developed, well-nourished male in no acute distress; appearance consistent with age of record HENT:  normocephalic; atraumatic; dry mucous membranes; asymmetric facies Eyes: pupils equal, round and reactive to light; extraocular muscles intact Neck: supple Heart: regular rate and rhythm; tachycardia Lungs: clear to auscultation bilaterally Abdomen: soft; bladder distended and tender; no masses or hepatosplenomegaly; bowel sounds present GU: Tanner 4 male, uncircumcised; blood clot at urethral meatus Extremities: Clubbing of nails; pulses normal Neurologic: Awake, alert, minimally verbal; motor function intact in all extremities; no obvious facial droop but asymmetric facies limits exam Skin: Warm and dry     ED Course  Procedures (including critical care time)  6:56 AM 22 French three-way Foley catheter placed by nursing staff. Small blood clot obtained followed by 600 mL of blood-tinged urine. The patient's bladder was decompressed and he  experienced relief of his discomfort. Urine has been sent for urinalysis and culture.   MDM   Nursing notes and vitals signs, including pulse oximetry, reviewed.  Summary of this visit's results, reviewed by myself:  Labs:  Results for orders placed during the hospital encounter of 02/18/13 (from the past 24 hour(s))  CBC WITH DIFFERENTIAL     Status: Abnormal   Collection Time    02/18/13  6:47 AM      Result Value Ref Range   WBC 14.4 (*) 4.0 - 10.5 K/uL   RBC 5.81  4.22 - 5.81 MIL/uL   Hemoglobin 13.9  13.0 - 17.0 g/dL   HCT 39.9  39.0 - 52.0 %   MCV 68.7 (*) 78.0 - 100.0 fL   MCH 23.9 (*) 26.0 - 34.0 pg   MCHC 34.8  30.0 - 36.0 g/dL   RDW 14.2  11.5 - 15.5 %   Platelets 141 (*) 150 - 400 K/uL   Neutrophils Relative % 89 (*) 43 - 77 %   Neutro Abs 12.8 (*) 1.7 - 7.7 K/uL   Lymphocytes Relative 6 (*) 12 - 46 %   Lymphs Abs 0.9  0.7 - 4.0 K/uL   Monocytes Relative 5  3 - 12 %   Monocytes Absolute 0.7  0.1 - 1.0 K/uL   Eosinophils Relative 0  0 - 5 %   Eosinophils Absolute 0.0  0.0 - 0.7 K/uL   Basophils Relative 0  0 - 1 %   Basophils Absolute 0.0  0.0 - 0.1 K/uL    7:03 AM Labs and Social Work consult pending. Dr. Roderic Palau will follow up and make disposition.      Wynetta Fines, MD 02/18/13 [phone]

## 2013-02-18 NOTE — Consult Note (Signed)
Reason for Consult:  Necrotizing infection of the pelvis Referring Physician: Dr. Kathryne Eriksson Jason [name] is an 68 y.o. male.  HPI: This is a 68 yo male with dementia who is five days s/p TURBT complicated by a tiny bladder perforation (extraperitoneal).  He was sent home with a foley in place, but apparently pulled it out himself.  He went to the ED at [facility] and was found to be in acute renal failure, bilateral pneumonia and sepsis.  Further work-up included a CT scan showing extensive necrotizing soft tissue infection in the pelvis extending to the rectus muscles.  No obvious abscess.  Patient was transferred to [facility] at [facility].  He does not appear to be in distress, but has worsening tenderness and discoloration around the rectum, as well as spreading erythema and discoloration of the lower anterior abdominal wall.  He is mildly hypotensive, but not on pressors.  Past Medical History  Diagnosis Date  . Hypertension   . Stroke     Dementia, otherwise, no residual  . Bladder mass 02-10-13    surgery planned for this  . 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 COPD (chronic obstructive pulmonary disease)     previous history and current smoking  . 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 Dementia 02/18/2013    Past Surgical History  Procedure Laterality Date  . Transurethral resection of bladder tumor with gyrus (turbt-gyrus) N/A 02/13/2013    Procedure: TRANSURETHRAL RESECTION OF BLADDER TUMOR WITH GYRUS (TURBT-GYRUS), bladder biopsies;  Surgeon: Ardis Hughs, MD;  Location: [facility];  Service: Urology;  Laterality: N/A;  . Cystoscopy/retrograde/ureteroscopy Bilateral 02/13/2013    Procedure: BILATERAL RETROGRADE;  Surgeon: Ardis Hughs, MD;  Location: [facility];  Service: Urology;  Laterality: Bilateral;    No family history on file.  Social History:  reports that he has been smoking.  He does not have any smokeless tobacco history on file. He reports that  he drinks alcohol. He reports that he does not use illicit drugs.  Allergies: No Known Allergies  Medications:  Prior to Admission medications   Medication Sig Start Date End Date Taking? Authorizing Provider  acetaminophen-codeine (TYLENOL #3) 300-30 MG per tablet Take 1 tablet by mouth every 4 (four) hours as needed. 02/13/13  Yes Ardis Hughs, MD  benazepril (LOTENSIN) 20 MG tablet Take 20 mg by mouth every morning.   Yes Historical Provider, MD  donepezil (ARICEPT) 10 MG tablet Take 10 mg by mouth at bedtime.   Yes Historical Provider, MD  aspirin EC 81 MG tablet Take 81 mg by mouth daily.    Historical Provider, MD  cetirizine (ZYRTEC) 10 MG tablet Take 10 mg by mouth daily.    Historical Provider, MD  docusate sodium (COLACE) 100 MG capsule Take 1 capsule (100 mg total) by mouth 2 (two) times daily as needed (take to keep stool soft.). 02/13/13   Ardis Hughs, MD  Multiple Vitamin (MULTIVITAMIN WITH MINERALS) TABS tablet Take 1 tablet by mouth daily.    Historical Provider, MD     Results for orders placed during the hospital encounter of 02/18/13 (from the past 48 hour(s))  CBC WITH DIFFERENTIAL     Status: Abnormal   Collection Time    02/18/13  6:47 AM      Result Value Ref Range   WBC 14.4 (*) 4.0 - 10.5 K/uL   RBC 5.81  4.22 - 5.81 MIL/uL   Hemoglobin 13.9  13.0 -  17.0 g/dL   HCT 56.3  42.4 - 44.8 %   MCV 68.7 (*) 78.0 - 100.0 fL   MCH 23.9 (*) 26.0 - 34.0 pg   MCHC 34.8  30.0 - 36.0 g/dL   RDW 80.6  90.7 - 31.7 %   Platelets 141 (*) 150 - 400 K/uL   Neutrophils Relative % 89 (*) 43 - 77 %   Neutro Abs 12.8 (*) 1.7 - 7.7 K/uL   Lymphocytes Relative 6 (*) 12 - 46 %   Lymphs Abs 0.9  0.7 - 4.0 K/uL   Monocytes Relative 5  3 - 12 %   Monocytes Absolute 0.7  0.1 - 1.0 K/uL   Eosinophils Relative 0  0 - 5 %   Eosinophils Absolute 0.0  0.0 - 0.7 K/uL   Basophils Relative 0  0 - 1 %   Basophils Absolute 0.0  0.0 - 0.1 K/uL  BASIC METABOLIC PANEL     Status:  Abnormal   Collection Time    02/18/13  6:47 AM      Result Value Ref Range   Sodium 137  137 - 147 mEq/L   Potassium 4.7  3.7 - 5.3 mEq/L   Chloride 97  96 - 112 mEq/L   CO2 22  19 - 32 mEq/L   Glucose, Bld 151 (*) 70 - 99 mg/dL   BUN 512 (*) 6 - 23 mg/dL   Creatinine, Ser 8.50 (*) 0.50 - 1.35 mg/dL   Calcium 8.4  8.4 - 54.4 mg/dL   GFR calc non Af Amer 16 (*) >90 mL/min   GFR calc Af Amer 18 (*) >90 mL/min   Comment: (NOTE)     The eGFR has been calculated using the CKD EPI equation.     This calculation has not been validated in all clinical situations.     eGFR's persistently <90 mL/min signify possible Chronic Kidney     Disease.  URINALYSIS, ROUTINE W REFLEX MICROSCOPIC     Status: Abnormal   Collection Time    02/18/13  7:21 AM      Result Value Ref Range   Color, Urine YELLOW  YELLOW   APPearance CLOUDY (*) CLEAR   Specific Gravity, Urine >1.030 (*) 1.005 - 1.030   pH 5.0  5.0 - 8.0   Glucose, UA NEGATIVE  NEGATIVE mg/dL   Hgb urine dipstick LARGE (*) NEGATIVE   Bilirubin Urine SMALL (*) NEGATIVE   Ketones, ur NEGATIVE  NEGATIVE mg/dL   Protein, ur 30 (*) NEGATIVE mg/dL   Urobilinogen, UA 0.2  0.0 - 1.0 mg/dL   Nitrite NEGATIVE  NEGATIVE   Leukocytes, UA MODERATE (*) NEGATIVE  URINE MICROSCOPIC-ADD ON     Status: Abnormal   Collection Time    02/18/13  7:21 AM      Result Value Ref Range   WBC, UA TOO NUMEROUS TO COUNT  <3 WBC/hpf   RBC / HPF TOO NUMEROUS TO COUNT  <3 RBC/hpf   Bacteria, UA MANY (*) RARE  CULTURE, BLOOD (ROUTINE X 2)     Status: None   Collection Time    02/18/13  8:15 AM      Result Value Ref Range   Specimen Description Blood     Special Requests NONE     Culture NO GROWTH <24 HRS     Report Status PENDING    LACTIC ACID, PLASMA     Status: Abnormal   Collection Time    02/18/13  8:24 AM  Result Value Ref Range   Lactic Acid, Venous 2.9 (*) 0.5 - 2.2 mmol/L  CULTURE, BLOOD (ROUTINE X 2)     Status: None   Collection Time     02/18/13  8:30 AM      Result Value Ref Range   Specimen Description Blood     Special Requests NONE     Culture NO GROWTH <24 HRS     Report Status PENDING    MRSA PCR SCREENING     Status: None   Collection Time    02/18/13 10:13 AM      Result Value Ref Range   MRSA by PCR NEGATIVE  NEGATIVE   Comment:            The GeneXpert MRSA Assay (FDA     approved for NASAL specimens     only), is one component of a     comprehensive MRSA colonization     surveillance program. It is not     intended to diagnose MRSA     infection nor to guide or     monitor treatment for     MRSA infections.  STREP PNEUMONIAE URINARY ANTIGEN     Status: None   Collection Time    02/18/13 10:15 AM      Result Value Ref Range   Strep Pneumo Urinary Antigen NEGATIVE  NEGATIVE   Comment: Performed at [facility]                Infection due to S. pneumoniae     cannot be absolutely ruled out     since the antigen present     may be below the detection limit     of the test.     Performed at [place], [facility]     Status: Abnormal   Collection Time    02/18/13  7:37 PM      Result Value Ref Range   Glucose-Capillary 112 (*) 70 - 99 mg/dL  BASIC METABOLIC PANEL     Status: Abnormal   Collection Time    02/18/13  8:32 PM      Result Value Ref Range   Sodium 144  137 - 147 mEq/L   Potassium 3.8  3.7 - 5.3 mEq/L   Chloride 112  96 - 112 mEq/L   CO2 20  19 - 32 mEq/L   Glucose, Bld 109 (*) 70 - 99 mg/dL   BUN 63 (*) 6 - 23 mg/dL   Creatinine, Ser 1.41 (*) 0.50 - 1.35 mg/dL   Calcium 7.2 (*) 8.4 - 10.5 mg/dL   GFR calc non Af Amer 49 (*) >90 mL/min   GFR calc Af Amer 57 (*) >90 mL/min   Comment: (NOTE)     The eGFR has been calculated using the CKD EPI equation.     This calculation has not been validated in all clinical situations.     eGFR's persistently <90 mL/min signify possible Chronic Kidney     Disease.  APTT     Status: None   Collection Time     02/18/13  8:32 PM      Result Value Ref Range   aPTT 30  24 - 37 seconds  PROTIME-INR     Status: None   Collection Time    02/18/13  8:32 PM      Result Value Ref Range   Prothrombin Time 14.8  11.6 - 15.2 seconds   INR 1.19  0.00 - 1.49  LACTIC ACID, PLASMA     Status: None   Collection Time    02/18/13  8:45 PM      Result Value Ref Range   Lactic Acid, Venous 1.4  0.5 - 2.2 mmol/L    Ct Abdomen Pelvis Wo Contrast  02/18/2013   CLINICAL DATA:  Fever. Recent transurethral resection of bladder tumor. Extraluminal gas collection in the left side of the pelvis.  EXAM: CT ABDOMEN AND PELVIS WITHOUT CONTRAST  TECHNIQUE: Multidetector CT imaging of the abdomen and pelvis was performed following the standard protocol without intravenous contrast.  COMPARISON:  Radiograph dated 02/18/2013 and CT scan dated 08/30/2006  FINDINGS: There are bilateral extensive lower lobe consolidative pulmonary infiltrates. No effusions. Heart size is normal. Tiny hiatal hernia.  There is extensive extraluminal air in the soft tissues of the pelvis including the presacral space and along the left side of the pelvis along the fascia of the left antral operator muscle as well as extending anteriorly along the deep fascia of the rectus muscles. This is consistent with and anaerobic infection and possible necrotizing fasciitis.  The liver, biliary tree, spleen, pancreas, adrenal glands, and kidneys demonstrate no significant abnormality. The bowel appears normal. Foley catheter is in place in the bladder with a small amount of air in the bladder.  IMPRESSION: 1. Evidence consistent with anaerobic soft tissue infection in the pelvis with extension to the fascia of the rectus muscles and of the left internal obturator muscle consistent with necrotizing fasciitis. No visible pus collections in the pelvis. 2. Extensive bilateral lower lobe pneumonia.  Critical Value/emergent results were called by telephone at the time of  interpretation on 02/18/2013 at 3:55 PM to Dr. Murray Hodgkins , who verbally acknowledged these results.   Electronically Signed   By: Rozetta Nunnery M.D.   On: 02/18/2013 15:56   US Renal  02/18/2013   CLINICAL DATA:  Acute renal failure  EXAM: RENAL/URINARY TRACT ULTRASOUND COMPLETE  COMPARISON:  01/13/2013  FINDINGS: Right Kidney:  Length: 11.2 cm.  No mass or hydronephrosis.  Left Kidney:  Length: 11.0 cm.  No mass or hydronephrosis.  Bladder:  Poorly visualized/decompressed by indwelling Foley catheter.  IMPRESSION: No hydronephrosis.  Bladder poorly visualized/decompressed by indwelling Foley catheter.   Electronically Signed   By: Julian Hy M.D.   On: 02/18/2013 13:36   Dg Chest Portable 1 View  02/18/2013   CLINICAL DATA:  Evaluate right central venous line insertion  EXAM: PORTABLE CHEST - 1 VIEW  COMPARISON:  DG CHEST 1V PORT dated 02/18/2013  FINDINGS: There is a right central venous line with tip in the distal SVC. No pneumothorax. Normal cardiac silhouette. There is bibasilar airspace disease greater on the right which is not to be changed from prior. No upper lobe airspace disease.  IMPRESSION: 1. Interval placement right central venous line without complication. 2. Bibasilar asymmetric airspace disease is not changed.   Electronically Signed   By: Suzy Bouchard M.D.   On: 02/18/2013 21:19   Dg Chest Portable 1 View  02/18/2013   CLINICAL DATA:  Hypoxia, COPD  EXAM: PORTABLE CHEST - 1 VIEW  COMPARISON:  01/27/2013  FINDINGS: Cardiac shadow is stable. Bilateral basilar infiltrates are now seen new from the prior exam. The bony structures are within normal limits.  IMPRESSION: Bibasilar infiltrates.   Electronically Signed   By: Inez Catalina M.D.   On: 02/18/2013 08:28   Dg Abd 2 Views  02/18/2013   CLINICAL  DATA:  Bladder tumor, urothelial carcinoma, possible vomiting, post TURBT 02/13/2013  EXAM: ABDOMEN - 2 VIEW  COMPARISON:  None  FINDINGS: Bibasilar infiltrates question pneumonia  versus aspiration.  Air-filled nondistended small bowel loops the mid abdomen, question postoperative ileus.  Bubbly gas identified in the pelvis, could be related to stool within the distal colon but extending farther left lateral than is typically seen with rectal vault stool, raising question of extraperitoneal gas in the left pelvis.  Bones unremarkable.  No urinary tract calcification.  No definite free intraperitoneal air.  IMPRESSION: Bibasilar opacities question pneumonia versus aspiration.  Potential postoperative ileus.  Bubbly gas extending farther left laterally in the inferior pelvis than is typically seen with stool, question prominent stool versus extraperitoneal extraluminal gas in patient who has had a recent cystoscopy and TURBT ; CT imaging of the abdomen and pelvis with IV and oral contrast recommended.  Findings called to Dr. Sarajane Jews on 02/18/2013 at 1315 hr.   Electronically Signed   By: Lavonia Dana M.D.   On: 02/18/2013 13:16    ROS Blood pressure 100/61, pulse 90, temperature 98.3 F (36.8 C), temperature source Oral, resp. rate 22, height _0  (1.778 m), weight 158 lb 15.2 oz (72.1 kg), SpO2 96.00%. Physical Exam Elderly male - pleasantly confused; NAD HEENT: AT/ [place] CV: RRR; no murmurs Chest: CTAB, no wheeze or crackles  Abd: Soft., discoloration of the skin in the left lower quadrant; spreading erythema Rectal - dark discoloration around anus; mildly tender; slight erythema around the darker discoloration  Ext: No edema.    Assessment/Plan: Very unusual necrotizing soft tissue infection of the pelvis - possibly from recent bladder procedure.  The patient needs immediate exploration and debridement of the necrotizing infection.  He will be on mechanical ventilation for some time after surgery.  He is at very high risk for complications with his pre-op comorbidities including dementia, bilateral pneumonia, preoperative sepsis.  I discussed with the patient's family, as he  is unable to sign his own papers.  They have given consent.  Jason [name] K. 02/18/2013, 10:00 PM

## 2013-02-18 NOTE — H&P (Addendum)
History and Physical  Jason [name] GUY:[id number] DOB: 1945-10-18 DOA: 02/18/2013  Referring physician: Dr. Roderic Palau in ED PCP: [name], MD   Chief Complaint: pulled foley out  HPI:  61yom presented to ED after pulling out his Foley catheter this morning by accident. Foley catheter was replaced by while in the emergency department developed fever, hypoxia and multiple laboratory studies were abnormal. Admitted for UTI, possible developing sepsis, acute renal failure.  History Limited, patient carries a diagnosis of dementia. He cannot remember why he is here in the emergency department. He denies any complaints at this point but does remember having some bleeding this morning after Foley catheter was removed. He may have had some nausea or vomiting. This is not clear.  Patient recently evaluated by urology for hematuria, found to have a lobulated mass in bladder and and underwent cystoscopy, TURBT and was discharged home with Foley 2/13. This was an outpatient procedure. Plans were made for return in one week for voiding trial.  In the emergency department Tmax 102.3 rectal. Respiratory rate 20s. Systolic blood pressure 56-387F. Borderline hypoxia noted. BUN 125, creatinine 3.65, lactic acid 2.9, white blood cell count 14.4, platelet count 114, urinalysis grossly abnormal. Chest x-ray suggested bibasilar infiltrates. Independent review suggests right greater than left, query aspiration. EKG demonstrates sinus tachycardia, right bundle branch block. Compared to previous study, right bundle branch block old.  Review of Systems:  Unreliable secondary to dementia. With that in mind: Negative for visual changes, sore throat, rash, new muscle aches, chest pain, SOB, abdominal pain.  Past Medical History  Diagnosis Date  . Hypertension   . Stroke     Dementia, otherwise, no residual  . Bladder mass 02-10-13    surgery planned for this  . 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 COPD (chronic obstructive pulmonary disease)     previous history and current smoking  . 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 Dementia 02/18/2013    Past Surgical History  Procedure Laterality Date  . Transurethral resection of bladder tumor with gyrus (turbt-gyrus) N/A 02/13/2013    Procedure: TRANSURETHRAL RESECTION OF BLADDER TUMOR WITH GYRUS (TURBT-GYRUS), bladder biopsies;  Surgeon: Ardis Hughs, MD;  Location: [facility];  Service: Urology;  Laterality: N/A;  . Cystoscopy/retrograde/ureteroscopy Bilateral 02/13/2013    Procedure: BILATERAL RETROGRADE;  Surgeon: Ardis Hughs, MD;  Location: [facility];  Service: Urology;  Laterality: Bilateral;    Social History:  reports that he has been smoking.  He does not have any smokeless tobacco history on file. He reports that he drinks alcohol. He reports that he does not use illicit drugs.  No Known Allergies  No family history on file. unable to obtain.   Prior to Admission medications   Medication Sig Start Date End Date Taking? Authorizing Provider  acetaminophen-codeine (TYLENOL #3) 300-30 MG per tablet Take 1 tablet by mouth every 4 (four) hours as needed. 02/13/13  Yes Ardis Hughs, MD  benazepril (LOTENSIN) 20 MG tablet Take 20 mg by mouth every morning.   Yes Historical Provider, MD  donepezil (ARICEPT) 10 MG tablet Take 10 mg by mouth at bedtime.   Yes Historical Provider, MD  aspirin EC 81 MG tablet Take 81 mg by mouth daily.    Historical Provider, MD  cetirizine (ZYRTEC) 10 MG tablet Take 10 mg by mouth daily.    Historical Provider, MD  docusate sodium (COLACE) 100 MG capsule Take 1 capsule (100 mg total) by mouth 2 (two) times  daily as needed (take to keep stool soft.). 02/13/13   Ardis Hughs, MD  Multiple Vitamin (MULTIVITAMIN WITH MINERALS) TABS tablet Take 1 tablet by mouth daily.    Historical Provider, MD   Physical Exam: Filed Vitals:   02/18/13 0756 02/18/13 0823 02/18/13 0903  02/18/13 0920  BP: 91/43 97/50 107/50 102/48  Pulse: 110 120 114 103  Temp:  102.3 F (39.1 C)    TempSrc:  Rectal    Resp: 18 24 29 22   Height:      Weight:      SpO2: 89% 92% 94% 95%    General: Examined in the emergency department. Appears calm and comfortable. Appears critically ill. Eyes: PERRL, normal lids, irises ENT: grossly normal hearing, lips & tongue Neck: no LAD, masses or thyromegaly Cardiovascular: RRR, no m/r/g. No LE edema. Respiratory: CTA bilaterally, no w/r/r. Normal respiratory effort. Abdomen: soft, ntnd Skin: no rash or induration seen on anterior exam Musculoskeletal: grossly normal tone BUE/BLE although exam is limited. Psychiatric: grossly normal mood and affect, speech fluent and appropriate Neurologic: May have slight left-sided weakness in the face at rest but smile is symmetric. Patient leaning to the left.  Wt Readings from Last 3 Encounters:  02/18/13 74.844 kg (165 lb)  02/10/13 67.586 kg (149 lb)  05/23/09 72.689 kg (160 lb 4 oz)    Labs on Admission:  Basic Metabolic Panel:  Recent Labs Lab 02/18/13 0647  NA 137  K 4.7  CL 97  CO2 22  GLUCOSE 151*  BUN 125*  CREATININE 3.65*  CALCIUM 8.4   CBC:  Recent Labs Lab 02/18/13 0647  WBC 14.4*  NEUTROABS 12.8*  HGB 13.9  HCT 39.9  MCV 68.7*  PLT 141*     Radiological Exams on Admission: Dg Chest Portable 1 View  02/18/2013   CLINICAL DATA:  Hypoxia, COPD  EXAM: PORTABLE CHEST - 1 VIEW  COMPARISON:  01/27/2013  FINDINGS: Cardiac shadow is stable. Bilateral basilar infiltrates are now seen new from the prior exam. The bony structures are within normal limits.  IMPRESSION: Bibasilar infiltrates.   Electronically Signed   By: Inez Catalina M.D.   On: 02/18/2013 08:28    EKG: Independently reviewed. As above.   Principal Problem:   Sepsis Active Problems:   ALCOHOL ABUSE   Memory loss   UTI (lower urinary tract infection)   Aspiration pneumonia   Acute respiratory failure  with hypoxia   Acute renal failure   Dementia   Hematuria   Urothelial carcinoma   Assessment/Plan 1. Suspected sepsis. UTI versus pneumonia. 2. Suspected right-sided pneumonia, possibly aspiration. 3. Acute hypoxic respiratory failure. Suspect secondary to pneumonia or UTI. 4. UTI. Treated empirically. Indwelling Foley catheter. 5. Acute renal failure. Laboratory pattern suggests prerenal etiology, hypotension also a consideration, consider Foley catheter malfunction. 6. Hematuria. Status post traumatic removal of Foley catheter and recent resection of bladder tumor. Hemoglobin stable. Follow CBC. 7. UROTHELIAL CARCINOMA, new diagnosis by pathology 2/13. Follow up as an outpatient. Needs close outpatient urology followup. 8. Sacral skin breakdown. Monitor. 9. Chart review indicates history of alcohol abuse, dementia multi-infarct versus alcohol related, left brain stroke 10. Lifelong smoker per chart. 11. Resident of group home   Patient appears critically ill. Admit to step down unit. Empiric antibiotics for sepsis, UTI, possible aspiration pneumonia. IV fluids, supplemental oxygen. Followup culture data urine and blood. Check sputum culture.  Speech therapy evaluation for possible aspiration.  Aggressive IV fluids. Renal ALT her son.  Wound  care.  Monitor I/O  Repeat laboratory studies in the morning.  Abdominal exam is benign, obtain abdominal films given possible history of vomiting.  Discussed above with brother and sister by telephone. The patient has no medical power of attorney. They have been making decisions for him. Per brother patient is full code. I discussed above with them, reviewed critically ill status and multiple issues. Prognosis is guarded, may not survive this hospitalization.  I have left a message to discuss with PCP Dr. Bradd Burner.   Code Status: full code DVT prophylaxis:SCDs (hematuria) Family Communication: brother Jason [name] [phone] Disposition  Plan/Anticipated LOS: admit 3-5 days  Time spent: 90 minutes  ADDENDUM: discussed with Dr. Thornton Papas at [phone], concern for possible perforation possibly related to recent bladder procedure. Patient reexamined. He denies pain. Abd is soft, ntnd, +BS. No hernias noted. VSS. Plan stat CT abdomen and pelvis with oral contrast. No IV contrast secondary to renal failure. Also discussed above with the patient's primary care physician by telephone.   Murray Hodgkins, MD  [facility] Pager [phone] 02/18/2013, 9:36 AM

## 2013-02-18 NOTE — ED Provider Notes (Signed)
Pt with uti and hypotension.  CRITICAL CARE Performed by: Shealee Yordy L Total critical care time: 45 Critical care time was exclusive of separately billable procedures and treating other patients. Critical care was necessary to treat or prevent imminent or life-threatening deterioration. Critical care was time spent personally by me on the following activities: development of treatment plan with patient and/or surrogate as well as nursing, discussions with consultants, evaluation of patient's response to treatment, examination of patient, obtaining history from patient or surrogate, ordering and performing treatments and interventions, ordering and review of laboratory studies, ordering and review of radiographic studies, pulse oximetry and re-evaluation of patient's condition. Admit to step down  Maudry Diego, MD 02/18/13 [phone]

## 2013-02-18 NOTE — ED Notes (Signed)
Pt moved to room 4 for continuous monitoring. 2nd IV line initiated

## 2013-02-18 NOTE — ED Notes (Signed)
Dr Goodrich at bedside

## 2013-02-18 NOTE — Anesthesia Procedure Notes (Signed)
Procedure Name: Intubation Date/Time: 02/18/2013 10:30 PM Performed by: Shalen Petrak S Pre-anesthesia Checklist: Patient identified, Timeout performed, Emergency Drugs available, Suction available and Patient being monitored Patient Re-evaluated:Patient Re-evaluated prior to inductionOxygen Delivery Method: Circle system utilized Preoxygenation: Pre-oxygenation with 100% oxygen Intubation Type: IV induction Ventilation: Mask ventilation without difficulty Laryngoscope Size: Mac and 4 Grade View: Grade I Tube type: Subglottic suction tube Tube size: 8.0 mm Number of attempts: 1 Airway Equipment and Method: Stylet Secured at: 22 cm Tube secured with: Tape Dental Injury: Teeth and Oropharynx as per pre-operative assessment

## 2013-02-18 NOTE — Progress Notes (Addendum)
ANTIBIOTIC CONSULT NOTE - INITIAL  Pharmacy Consult for Vancomycin and Zosyn Indication: pneumonia, suspected sepsis  No Known Allergies  Patient Measurements: Height: 5\' 10"  (177.8 cm) Weight: 149 lb 7.6 oz (67.8 kg) IBW/kg (Calculated) : 73  Vital Signs: Temp: 101.5 F (38.6 C) (02/18 1019) Temp src: Oral (02/18 1019) BP: 95/48 mmHg (02/18 1019) Pulse Rate: 101 (02/18 1019) Intake/Output from previous day:   Intake/Output from this shift:    Labs:  Recent Labs  02/18/13 0647  WBC 14.4*  HGB 13.9  PLT 141*  CREATININE 3.65*   Estimated Creatinine Clearance: 18.3 ml/min (by C-G formula based on Cr of 3.65). No results found for this basename: VANCOTROUGH, Corlis Leak, VANCORANDOM, [place], GENTPEAK, GENTRANDOM, TOBRATROUGH, TOBRAPEAK, TOBRARND, AMIKACINPEAK, AMIKACINTROU, AMIKACIN,  in the last 72 hours   Microbiology: Recent Results (from the past 720 hour(s))  CULTURE, BLOOD (ROUTINE X 2)     Status: None   Collection Time    02/18/13  8:15 AM      Result Value Ref Range Status   Specimen Description Blood   Final   Special Requests NONE   Final   Culture NO GROWTH <24 HRS   Final   Report Status PENDING   Incomplete  CULTURE, BLOOD (ROUTINE X 2)     Status: None   Collection Time    02/18/13  8:30 AM      Result Value Ref Range Status   Specimen Description Blood   Final   Special Requests NONE   Final   Culture NO GROWTH <24 HRS   Final   Report Status PENDING   Incomplete    Medical History: Past Medical History  Diagnosis Date  . Hypertension   . Stroke     Dementia, otherwise, no residual  . Bladder mass 02-10-13    surgery planned for this  . 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 COPD (chronic obstructive pulmonary disease)     previous history and current smoking  . 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 Dementia 02/18/2013    Medications:  Scheduled:  . piperacillin-tazobactam (ZOSYN)  IV  2.25 g Intravenous 4  times per day  . simethicone      . sodium chloride  3 mL Intravenous Q12H  . vancomycin  1,500 mg Intravenous Once  . [START ON 02/19/2013] vancomycin  750 mg Intravenous Q24H   Assessment: 68yo male with fever and suspected sepsis from possible aspiration pneumonia.  Pt has elevated SCr and ARF.  Estimated Creatinine Clearance: 18.3 ml/min (by C-G formula based on Cr of 3.65).  Anticipate SCr will improve. Pt does not have a history of renal failure.  Goal of Therapy:  Vancomycin trough level 15-20 mcg/ml  Plan:  Vancomycin 1500mg  IV now x 1 dose (loading dose) then Vancomycin 750mg  IV q24hrs Check trough at steady state. Zosyn 2.25gm IV q6hrs F/U SCr daily for now  Monitor labs, renal fxn, and cultures  [place] Crane, Scott A 02/18/2013,10:31 AM  CT abdomen +necrotizing fasciitis of abdominal wall extending into pelvis without abscess.  Imaging also + BLL PNA.  Zosyn is being changed to Meropenem per MD.  No hx of seizures noted.  Dose adjusted for acute renal failure.  Meropenem 500mg  IV q12h F/U renal function, patient progress, & cx data  Netta Cedars, PharmD, BCPS 02/18/2013@4 :45 PM

## 2013-02-18 NOTE — Consult Note (Signed)
Urology Consult  Referring physician: [place] Bells Reason for referral: Sepsis  Chief Complaint: Sepsis and fasciitis  History of Present Illness: 68 year old male; TUR last week and at end of case experienced small perforation of left from obturator nerve response; home with foley; cipro pre op; treated UTI in December; extraperitoneal perforation; came with sepsis and pneumonia and CT scan positive as noted below; Hx of dementia/alcohol/smoker; u/a positive; WBC elevated 14.2 and acute renal failure CR 1.03-3.65; patient had pulled out catheter and replaced in ER; Modifying factors: There are no other modifying factors  Associated signs and symptoms: There are no other associated signs and symptoms Aggravating and relieving factors: There are no other aggravating or relieving factors Severity: Severe Duration: Persistent   Past Medical History  Diagnosis Date  . Hypertension   . Stroke     Dementia, otherwise, no residual  . Bladder mass 02-10-13    surgery planned for this  . 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 COPD (chronic obstructive pulmonary disease)     previous history and current smoking  . 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 Dementia 02/18/2013   Past Surgical History  Procedure Laterality Date  . Transurethral resection of bladder tumor with gyrus (turbt-gyrus) N/A 02/13/2013    Procedure: TRANSURETHRAL RESECTION OF BLADDER TUMOR WITH GYRUS (TURBT-GYRUS), bladder biopsies;  Surgeon: Ardis Hughs, MD;  Location: [facility];  Service: Urology;  Laterality: N/A;  . Cystoscopy/retrograde/ureteroscopy Bilateral 02/13/2013    Procedure: BILATERAL RETROGRADE;  Surgeon: Ardis Hughs, MD;  Location: [facility];  Service: Urology;  Laterality: Bilateral;    Medications: I have reviewed the patient's current medications. Allergies: No Known Allergies  No family history on file. Social History:  reports that he has been smoking.  He does not have any  smokeless tobacco history on file. He reports that he drinks alcohol. He reports that he does not use illicit drugs.  ROS: All systems are reviewed and negative except as noted. Rest negative  Physical Exam:  Vital signs in last 24 hours: Temp:  [97.6 F (36.4 C)-102.3 F (39.1 C)] 98.3 F (36.8 C) (02/18 1948) Pulse Rate:  [31-120] 88 (02/18 2030) Resp:  [18-29] 26 (02/18 2030) BP: (90-112)/(43-63) 103/60 mmHg (02/18 2030) SpO2:  [81 %-98 %] 98 % (02/18 2030) Weight:  [67.8 kg (149 lb 7.6 oz)-74.844 kg (165 lb)] 72.1 kg (158 lb 15.2 oz) (02/18 2000)  Cardiovascular: Skin warm; not flushed Respiratory: Breaths quiet; no shortness of breath Abdomen: No masses Neurological: Normal sensation to touch Musculoskeletal: Normal motor function arms and legs Lymphatics: No inguinal adenopathy Skin: No rashes Genitourinary:? Black colored skin with no crepitus very superficial left of midline s/p area; blackened butterfly shape patch of skin around rectum-tender Blood around meatus Alert but hx affected by dementia Clear urine in foley bag BM with no blood in stool  Laboratory Data:  Results for orders placed during the hospital encounter of 02/18/13 (from the past 72 hour(s))  CBC WITH DIFFERENTIAL     Status: Abnormal   Collection Time    02/18/13  6:47 AM      Result Value Ref Range   WBC 14.4 (*) 4.0 - 10.5 K/uL   RBC 5.81  4.22 - 5.81 MIL/uL   Hemoglobin 13.9  13.0 - 17.0 g/dL   HCT 39.9  39.0 - 52.0 %   MCV 68.7 (*) 78.0 - 100.0 fL   MCH 23.9 (*) 26.0 - 34.0 pg  MCHC 34.8  30.0 - 36.0 g/dL   RDW 14.2  11.5 - 15.5 %   Platelets 141 (*) 150 - 400 K/uL   Neutrophils Relative % 89 (*) 43 - 77 %   Neutro Abs 12.8 (*) 1.7 - 7.7 K/uL   Lymphocytes Relative 6 (*) 12 - 46 %   Lymphs Abs 0.9  0.7 - 4.0 K/uL   Monocytes Relative 5  3 - 12 %   Monocytes Absolute 0.7  0.1 - 1.0 K/uL   Eosinophils Relative 0  0 - 5 %   Eosinophils Absolute 0.0  0.0 - 0.7 K/uL   Basophils Relative  0  0 - 1 %   Basophils Absolute 0.0  0.0 - 0.1 K/uL  BASIC METABOLIC PANEL     Status: Abnormal   Collection Time    02/18/13  6:47 AM      Result Value Ref Range   Sodium 137  137 - 147 mEq/L   Potassium 4.7  3.7 - 5.3 mEq/L   Chloride 97  96 - 112 mEq/L   CO2 22  19 - 32 mEq/L   Glucose, Bld 151 (*) 70 - 99 mg/dL   BUN 125 (*) 6 - 23 mg/dL   Creatinine, Ser 3.65 (*) 0.50 - 1.35 mg/dL   Calcium 8.4  8.4 - 10.5 mg/dL   GFR calc non Af Amer 16 (*) >90 mL/min   GFR calc Af Amer 18 (*) >90 mL/min   Comment: (NOTE)     The eGFR has been calculated using the CKD EPI equation.     This calculation has not been validated in all clinical situations.     eGFR's persistently <90 mL/min signify possible Chronic Kidney     Disease.  URINALYSIS, ROUTINE W REFLEX MICROSCOPIC     Status: Abnormal   Collection Time    02/18/13  7:21 AM      Result Value Ref Range   Color, Urine YELLOW  YELLOW   APPearance CLOUDY (*) CLEAR   Specific Gravity, Urine >1.030 (*) 1.005 - 1.030   pH 5.0  5.0 - 8.0   Glucose, UA NEGATIVE  NEGATIVE mg/dL   Hgb urine dipstick LARGE (*) NEGATIVE   Bilirubin Urine SMALL (*) NEGATIVE   Ketones, ur NEGATIVE  NEGATIVE mg/dL   Protein, ur 30 (*) NEGATIVE mg/dL   Urobilinogen, UA 0.2  0.0 - 1.0 mg/dL   Nitrite NEGATIVE  NEGATIVE   Leukocytes, UA MODERATE (*) NEGATIVE  URINE MICROSCOPIC-ADD ON     Status: Abnormal   Collection Time    02/18/13  7:21 AM      Result Value Ref Range   WBC, UA TOO NUMEROUS TO COUNT  <3 WBC/hpf   RBC / HPF TOO NUMEROUS TO COUNT  <3 RBC/hpf   Bacteria, UA MANY (*) RARE  CULTURE, BLOOD (ROUTINE X 2)     Status: None   Collection Time    02/18/13  8:15 AM      Result Value Ref Range   Specimen Description Blood     Special Requests NONE     Culture NO GROWTH <24 HRS     Report Status PENDING    LACTIC ACID, PLASMA     Status: Abnormal   Collection Time    02/18/13  8:24 AM      Result Value Ref Range   Lactic Acid, Venous 2.9 (*) 0.5  - 2.2 mmol/L  CULTURE, BLOOD (ROUTINE X 2)  Status: None   Collection Time    02/18/13  8:30 AM      Result Value Ref Range   Specimen Description Blood     Special Requests NONE     Culture NO GROWTH <24 HRS     Report Status PENDING    MRSA PCR SCREENING     Status: None   Collection Time    02/18/13 10:13 AM      Result Value Ref Range   MRSA by PCR NEGATIVE  NEGATIVE   Comment:            The GeneXpert MRSA Assay (FDA     approved for NASAL specimens     only), is one component of a     comprehensive MRSA colonization     surveillance program. It is not     intended to diagnose MRSA     infection nor to guide or     monitor treatment for     MRSA infections.  GLUCOSE, CAPILLARY     Status: Abnormal   Collection Time    02/18/13  7:37 PM      Result Value Ref Range   Glucose-Capillary 112 (*) 70 - 99 mg/dL   Recent Results (from the past 240 hour(s))  CULTURE, BLOOD (ROUTINE X 2)     Status: None   Collection Time    02/18/13  8:15 AM      Result Value Ref Range Status   Specimen Description Blood   Final   Special Requests NONE   Final   Culture NO GROWTH <24 HRS   Final   Report Status PENDING   Incomplete  CULTURE, BLOOD (ROUTINE X 2)     Status: None   Collection Time    02/18/13  8:30 AM      Result Value Ref Range Status   Specimen Description Blood   Final   Special Requests NONE   Final   Culture NO GROWTH <24 HRS   Final   Report Status PENDING   Incomplete  MRSA PCR SCREENING     Status: None   Collection Time    02/18/13 10:13 AM      Result Value Ref Range Status   MRSA by PCR NEGATIVE  NEGATIVE Final   Comment:            The GeneXpert MRSA Assay (FDA     approved for NASAL specimens     only), is one component of a     comprehensive MRSA colonization     surveillance program. It is not     intended to diagnose MRSA     infection nor to guide or     monitor treatment for     MRSA infections.   Creatinine:  Recent Labs   02/18/13 0647  CREATININE 3.65*    Xrays: See report/chart Possible necrotizing fasciitis of rectus/obturator internus of left/air pre sacral Foley in place with thick bladder Bowel and kidneys reported normal Bilateral lower lobe pnemonia  Impression/Assessment:  Likely necrotizing fascia as a complication of a distant bladder perforation appropriately managed but complicated by pulling out of foley catheter  Plan:  Discuss tonight with general surgery Agree with treatment plan by general surgery  Ziggy Reveles A 02/18/2013, 9:02 PM

## 2013-02-18 NOTE — Op Note (Signed)
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 Assistant: Dr. Nicki Reaper MacDiarmid Anesthesia: Gen. Endotracheal Indications: This is a 68 year old male with significant medical comorbidities who presents in transfer from [facility]. The patient is several days status post transurethral resection of a bladder tumor. Apparently, at the time of the procedure he was noted to have a pinhole perforation. He was instructed to keep a Foley catheter for a week. However he suffers from dementia and pulled his own Foley catheter out. He presented to [facility] earlier today for Foley reinsertion.  He was found to be septic with apparent bilateral basilar pneumonia and acute renal failure. He underwent a CT scan which showed a large amount of soft tissue gas consistent with a necrotizing infection of the pelvic sidewalls and presacral space. He was transferred to [facility] for further evaluation and treatment. He was seen in consultation by general surgery as well as urology. This is a very unusual presentation of possible sepsis after bladder perforation. However secondary to the patient's tenuous clinical status we recommended immediate exploration.Consent was obtained from the patient's family members.  Description of procedure: The patient is brought to the operating room and placed in lithotomy position on the operating room table. After an adequate level of general anesthesia was obtained, his legs were placed in yellowfin stirrups with appropriate padding. We prepped his abdomen with chloraprep and his perineum and thighs with Betadine.  We draped in sterile fashion. A timeout was taken to ensure the proper patient proper procedure. I made a lower midline  incision and dissected down through the subcutaneous tissues with cautery. We entered the preperitoneal space. The rectus muscle on the left appeared healthy. However when we dissected then the fracture muscular preperitoneal space on the left side we entered a pocket with a lot of purulent fluid. We opened this pocket widely behind the rectus muscle. There is some necrotic tissue that was abraded. However most of the infection was in the form of purulent fluid. This tracked down into the obturator canal. We opened the preperitoneal space on the right. There is also purulent fluid here but this is less extensive than the left. We irrigated this thoroughly. We then entered the peritoneal cavity. There is some mild inflammation of the anterior surface of the rectum. The bladder was quite thickened. We examined the rectum and sigmoid colon. There is no sign of perforation. Based on the findings on the CT scan, I scored the peritoneum and enter the presacral space. There is some minimal inflammatory fluid but no gross purulence. I opened the presacral space down towards the coccyx. We irrigated this thoroughly. I placed a 14 [place] Blake drain into this presacral space and brought out the patient's right lower quadrant. This was secured with 2-0 nylon. We then irrigated the preperitoneal space widely. The bladder was carefully examined. As part of the procedure will be dictated separately by urology. We made the decision to place pre-peritoneal drains and to not attempt any bladder repair. A 1 inch Penrose drain was inserted down into the left obturator canal.  This was brought out through a small incision on the patient's left lower quadrant. This was secured with 2-0 nylon. We placed another Penrose drain in the right preperitoneal space and brought out through a separate stab incision. This was also secured  with nylon.  The fascia was then reapproximated with double-stranded #1 PDS suture. We irrigated the  subcutaneous tissues and packed the wound with saline moistened gauze. A BB dressings were then placed in the midline incision as well as both Penrose drains. The Blake drain was placed to bulb suction.  I then went below and performed a thorough rectal examination. He had some skin discoloration around his rectum but there was no induration or fluctuance noted around the rectum. His rectal vault was filled with foul-smelling liquid stool. The skin discoloration may be chronic skin changes unrelated to his current condition.  The patient was then transported back to the intensive care unit in critical condition. He is left intubated. All sponge, Instrument, and needle counts are correct.   Imogene Burn. Georgette Dover, MD, [facility]  General/ Trauma Surgery  02/19/2013 12:07 AM

## 2013-02-19 ENCOUNTER — Inpatient Hospital Stay (HOSPITAL_COMMUNITY): Payer: Medicare (Managed Care)

## 2013-02-19 LAB — POCT I-STAT 3, ART BLOOD GAS (G3+)
ACID-BASE DEFICIT: 7 mmol/L — AB (ref 0.0–2.0)
ACID-BASE DEFICIT: 5 mmol/L — AB (ref 0.0–2.0)
BICARBONATE: 18.7 meq/L — AB (ref 20.0–24.0)
Bicarbonate: 19.1 mEq/L — ABNORMAL LOW (ref 20.0–24.0)
O2 Saturation: 93 %
O2 Saturation: 97 %
PCO2 ART: 30.8 mmHg — AB (ref 35.0–45.0)
PO2 ART: 73 mmHg — AB (ref 80.0–100.0)
Patient temperature: 98.3
Patient temperature: 97.8
TCO2: 20 mmol/L (ref 0–100)
TCO2: 20 mmol/L (ref 0–100)
pCO2 arterial: 39.1 mmHg (ref 35.0–45.0)
pH, Arterial: 7.288 — ABNORMAL LOW (ref 7.350–7.450)
pH, Arterial: 7.397 (ref 7.350–7.450)
pO2, Arterial: 85 mmHg (ref 80.0–100.0)

## 2013-02-19 LAB — BASIC METABOLIC PANEL
BUN: 51 mg/dL — ABNORMAL HIGH (ref 6–23)
BUN: 42 mg/dL — ABNORMAL HIGH (ref 6–23)
BUN: 39 mg/dL — AB (ref 6–23)
CHLORIDE: 116 meq/L — AB (ref 96–112)
CO2: 19 mEq/L (ref 19–32)
CO2: 21 meq/L (ref 19–32)
CO2: 21 meq/L (ref 19–32)
CREATININE: 1.13 mg/dL (ref 0.50–1.35)
Calcium: 7.5 mg/dL — ABNORMAL LOW (ref 8.4–10.5)
Calcium: 7.9 mg/dL — ABNORMAL LOW (ref 8.4–10.5)
Calcium: 7.8 mg/dL — ABNORMAL LOW (ref 8.4–10.5)
Chloride: 109 mEq/L (ref 96–112)
Chloride: 113 mEq/L — ABNORMAL HIGH (ref 96–112)
Creatinine, Ser: 1.02 mg/dL (ref 0.50–1.35)
Creatinine, Ser: 1.03 mg/dL (ref 0.50–1.35)
GFR calc Af Amer: 75 mL/min — ABNORMAL LOW (ref >90)
GFR calc Af Amer: 85 mL/min — ABNORMAL LOW (ref >90)
GFR calc Af Amer: 84 mL/min — ABNORMAL LOW (ref >90)
GFR calc non Af Amer: 64 mL/min — ABNORMAL LOW (ref >90)
GFR calc non Af Amer: 73 mL/min — ABNORMAL LOW (ref >90)
GFR calc non Af Amer: 72 mL/min — ABNORMAL LOW (ref >90)
GLUCOSE: 103 mg/dL — AB (ref 70–99)
GLUCOSE: 122 mg/dL — AB (ref 70–99)
GLUCOSE: 152 mg/dL — AB (ref 70–99)
POTASSIUM: 4.5 meq/L (ref 3.7–5.3)
POTASSIUM: 4.7 meq/L (ref 3.7–5.3)
Potassium: 4.4 mEq/L (ref 3.7–5.3)
SODIUM: 145 meq/L (ref 137–147)
Sodium: 143 mEq/L (ref 137–147)
Sodium: 149 mEq/L — ABNORMAL HIGH (ref 137–147)

## 2013-02-19 LAB — CORTISOL: CORTISOL PLASMA: 21.4 ug/dL

## 2013-02-19 LAB — HEPATIC FUNCTION PANEL
ALT: 42 U/L (ref 0–53)
AST: 43 U/L — ABNORMAL HIGH (ref 0–37)
Albumin: 1.8 g/dL — ABNORMAL LOW (ref 3.5–5.2)
Alkaline Phosphatase: 105 U/L (ref 39–117)
BILIRUBIN DIRECT: 1 mg/dL — AB (ref 0.0–0.3)
BILIRUBIN INDIRECT: 0.5 mg/dL (ref 0.3–0.9)
BILIRUBIN TOTAL: 1.5 mg/dL — AB (ref 0.3–1.2)
Total Protein: 5.1 g/dL — ABNORMAL LOW (ref 6.0–8.3)

## 2013-02-19 LAB — GLUCOSE, CAPILLARY
GLUCOSE-CAPILLARY: 109 mg/dL — AB (ref 70–99)
GLUCOSE-CAPILLARY: 110 mg/dL — AB (ref 70–99)
GLUCOSE-CAPILLARY: 132 mg/dL — AB (ref 70–99)
Glucose-Capillary: 76 mg/dL (ref 70–99)
Glucose-Capillary: 124 mg/dL — ABNORMAL HIGH (ref 70–99)
Glucose-Capillary: 127 mg/dL — ABNORMAL HIGH (ref 70–99)

## 2013-02-19 LAB — LEGIONELLA ANTIGEN, URINE: Legionella Antigen, Urine: NEGATIVE

## 2013-02-19 LAB — PHOSPHORUS
PHOSPHORUS: 2.7 mg/dL (ref 2.3–4.6)
PHOSPHORUS: 2.3 mg/dL (ref 2.3–4.6)

## 2013-02-19 LAB — TRIGLYCERIDES: Triglycerides: 117 mg/dL (ref <150)

## 2013-02-19 LAB — TSH: TSH: 0.278 u[IU]/mL — ABNORMAL LOW (ref 0.350–4.500)

## 2013-02-19 LAB — MAGNESIUM
MAGNESIUM: 2.5 mg/dL (ref 1.5–2.5)
Magnesium: 2.5 mg/dL (ref 1.5–2.5)

## 2013-02-19 MED ORDER — SODIUM CHLORIDE 0.9 % IV SOLN
1.0000 g | Freq: Three times a day (TID) | INTRAVENOUS | Status: DC
  Administered 2013-02-19 – 2013-02-22 (×10): 1 g via INTRAVENOUS
  Filled 2013-02-19 (×12): qty 1

## 2013-02-19 MED ORDER — POTASSIUM CHLORIDE 10 MEQ/50ML IV SOLN
10.0000 meq | INTRAVENOUS | Status: AC
  Administered 2013-02-19 (×3): 10 meq via INTRAVENOUS
  Filled 2013-02-19 (×3): qty 50

## 2013-02-19 MED ORDER — FENTANYL BOLUS VIA INFUSION
25.0000 ug | INTRAVENOUS | Status: DC | PRN
  Filled 2013-02-19: qty 50

## 2013-02-19 MED ORDER — VITAL AF 1.2 CAL PO LIQD
1000.0000 mL | ORAL | Status: DC
  Administered 2013-02-19: 1000 mL
  Filled 2013-02-19 (×10): qty 1000

## 2013-02-19 MED ORDER — SODIUM CHLORIDE 0.9 % IV BOLUS (SEPSIS): 1000.0000 mL | Freq: Once | INTRAVENOUS | Status: DC

## 2013-02-19 MED ORDER — BIOTENE DRY MOUTH MT LIQD
15.0000 mL | Freq: Four times a day (QID) | OROMUCOSAL | Status: DC
  Administered 2013-02-19 – 2013-03-02 (×42): 15 mL via OROMUCOSAL

## 2013-02-19 MED ORDER — ETOMIDATE 2 MG/ML IV SOLN
20.0000 mg | Freq: Once | INTRAVENOUS | Status: AC
  Administered 2013-02-19: 20 mg via INTRAVENOUS

## 2013-02-19 MED ORDER — FENTANYL CITRATE 0.05 MG/ML IJ SOLN
50.0000 ug | INTRAMUSCULAR | Status: DC | PRN
  Administered 2013-02-20 – 2013-02-21 (×6): 50 ug via INTRAVENOUS
  Filled 2013-02-19 (×7): qty 2

## 2013-02-19 MED ORDER — 0.9 % SODIUM CHLORIDE (POUR BTL) OPTIME
TOPICAL | Status: DC | PRN
  Administered 2013-02-19: 3000 mL

## 2013-02-19 MED ORDER — VITAL HIGH PROTEIN PO LIQD
1000.0000 mL | ORAL | Status: DC
  Filled 2013-02-19 (×3): qty 1000

## 2013-02-19 MED ORDER — HEPARIN SODIUM (PORCINE) 5000 UNIT/ML IJ SOLN
5000.0000 [IU] | Freq: Three times a day (TID) | INTRAMUSCULAR | Status: DC
  Administered 2013-02-20 – 2013-03-02 (×32): 5000 [IU] via SUBCUTANEOUS
  Filled 2013-02-19 (×35): qty 1

## 2013-02-19 MED ORDER — HYDROCORTISONE NA SUCCINATE PF 100 MG IJ SOLR
50.0000 mg | Freq: Four times a day (QID) | INTRAMUSCULAR | Status: DC
  Administered 2013-02-19 – 2013-02-20 (×5): 50 mg via INTRAVENOUS
  Filled 2013-02-19 (×9): qty 1

## 2013-02-19 MED ORDER — FENTANYL CITRATE 0.05 MG/ML IJ SOLN: 50.0000 ug | Freq: Once | INTRAMUSCULAR | Status: DC

## 2013-02-19 MED ORDER — METOPROLOL TARTRATE 1 MG/ML IV SOLN
INTRAVENOUS | Status: AC
  Filled 2013-02-19: qty 5

## 2013-02-19 MED ORDER — ETOMIDATE 2 MG/ML IV SOLN
INTRAVENOUS | Status: AC
  Filled 2013-02-19: qty 10

## 2013-02-19 MED ORDER — ACETYLCYSTEINE 20 % IN SOLN
4.0000 mL | Freq: Two times a day (BID) | RESPIRATORY_TRACT | Status: DC
Start: 2013-02-19 — End: 2013-02-21
  Administered 2013-02-19 – 2013-02-20 (×4): 4 mL via RESPIRATORY_TRACT
  Filled 2013-02-19 (×7): qty 4

## 2013-02-19 MED ORDER — CHLORHEXIDINE GLUCONATE 0.12 % MT SOLN
15.0000 mL | Freq: Two times a day (BID) | OROMUCOSAL | Status: DC
  Administered 2013-02-19 – 2013-03-02 (×21): 15 mL via OROMUCOSAL
  Filled 2013-02-19 (×27): qty 15

## 2013-02-19 MED ORDER — SODIUM CHLORIDE 0.9 % IV SOLN
0.0000 ug/h | INTRAVENOUS | Status: DC
  Administered 2013-02-19: 50 ug/h via INTRAVENOUS
  Filled 2013-02-19: qty 50

## 2013-02-19 MED ORDER — VANCOMYCIN HCL IN DEXTROSE 750-5 MG/150ML-% IV SOLN
750.0000 mg | Freq: Two times a day (BID) | INTRAVENOUS | Status: DC
  Administered 2013-02-19: 750 mg via INTRAVENOUS
  Filled 2013-02-19 (×3): qty 150

## 2013-02-19 MED ORDER — ALBUTEROL SULFATE (2.5 MG/3ML) 0.083% IN NEBU
2.5000 mg | INHALATION_SOLUTION | Freq: Two times a day (BID) | RESPIRATORY_TRACT | Status: DC
  Administered 2013-02-19 – 2013-03-02 (×20): 2.5 mg via RESPIRATORY_TRACT
  Filled 2013-02-19 (×25): qty 3

## 2013-02-19 MED ORDER — METOPROLOL TARTRATE 1 MG/ML IV SOLN: 2.5000 mg | INTRAVENOUS | Status: DC | PRN

## 2013-02-19 MED ORDER — PROPOFOL 10 MG/ML IV EMUL
0.0000 ug/kg/min | INTRAVENOUS | Status: DC
  Administered 2013-02-19: 10 ug/kg/min via INTRAVENOUS
  Administered 2013-02-19: 40 ug/kg/min via INTRAVENOUS
  Filled 2013-02-19: qty 200
  Filled 2013-02-19: qty 100

## 2013-02-19 NOTE — Progress Notes (Signed)
Name: Jason [name] MRN: [id number] DOB: 05-10-1945    ADMISSION DATE:  02/18/2013 REFERRING MD : [facility], Dr. Sarajane Jews  PRIMARY SERVICE: PCCM   CHIEF COMPLAINT: Sepsis   BRIEF PATIENT DESCRIPTION: 68 yo AAF s/p TURP procedure, presented to [place] ED after pulling out his foley catheter. Initial lab work revealed ARF, UTI, suspected aspiration pneumonia, and sepsis. He was febrile and hypoxic, with labs and symptoms of sepsis. CT scan ab/pelvis discussed w/ evidence of soft tissue infection in the pelvis with extension to the fascia of the rectus muscles and of the left internal obturator muscle consistent with necrotizing fasciitis. No visible pus collections in the pelvis as well as extensive bilateral lower lobe pneumonia. Blood pressures became soft, but pt responded well to 3L IVF.   SIGNIFICANT EVENTS / STUDIES:  2/18 CT abd/pelvis w/o contrast: Evidence consistent with anaerobic soft tissue infection in the pelvis with extension to the fascia of the rectus muscles and of the left internal obturator muscle consistent with necrotizing fasciitis. No visible pus collections in the pelvis. Extensive bilateral lower lobe pneumonia.  2/18 US Renal: No hydronephrosis.  2/18 CXR: Bibasilar opacities question pneumonia versus aspiration. Potential postoperative ileus. Bubbly gas extending farther left laterally in the inferior pelvis than is typically seen with stool, question prominent stool versus extraperitoneal extraluminal gas in patient who has had a recent cystoscopy and TURBT  2/18 Exploratory laparotomy with drainage of retroperitoneal and preperitoneal soft tissue infection 2/19 CXR Endotracheal tube appears in good position. New right lower lobe atelectasis.  Interval placement NG tube with side port is just above GE  junction.  LINES / TUBES:  2/18 Foley Cath>>  2/18 PIV x1>>  2/18 RIJ>>>  2/19 OETT>>  CULTURES:  2/18 blood culture>>  2/18 urine culture>> 2/19 wound cultures  >> 2/19 fungus culture >>   ANTIBIOTICS:  mero 2/18>>>  vanc 2/18>>>  clinda 2/18>>>   SUBJECTIVE: Sedated. Intubated. S/p exp lap with drainage. No pressors needed overnight.   VITAL SIGNS: Temp:  [97.6 F (36.4 C)-102.3 F (39.1 C)] 98.3 F (36.8 C) (02/18 1948) Pulse Rate:  [31-120] 90 (02/18 2215) Resp:  [18-29] 25 (02/18 2215) BP: (90-112)/(43-76) 98/57 mmHg (02/18 2215) SpO2:  [81 %-100 %] 96 % (02/18 2215) FiO2 (%):  [50 %] 50 % (02/19 0023) Weight:  [149 lb 7.6 oz (67.8 kg)-165 lb (74.844 kg)] 158 lb 15.2 oz (72.1 kg) (02/18 2000) HEMODYNAMICS: CVP:  [5 mmHg-10 mmHg] 5 mmHg VENTILATOR SETTINGS: Vent Mode:  [-] PRVC FiO2 (%):  [50 %] 50 % Set Rate:  [16 bmp] 16 bmp Vt Set:  [590 mL] 590 mL PEEP:  [5 cmH20] 5 cmH20 Plateau Pressure:  [18 cmH20] 18 cmH20 INTAKE / OUTPUT: Intake/Output     02/18 0701 - 02/19 0700   P.O. 1006   I.V. (mL/kg) 2150 (29.8)   Other 1000   IV Piggyback 650   Total Intake(mL/kg) 4806 (66.7)   Urine (mL/kg/hr) 1275 (0.7)   Blood 25 (0)   Total Output 1300   Net +3506       Stool Occurrence 1 x     PHYSICAL EXAMINATION: Gen: Sedated. Intubated. BP stable.  HEENT: AT. [place]. RIJ. OETT.  CV: RRR s1 s2 No m  Chest: CTAB, no wheeze or crackles, reduced rt base  Abd: Soft, bilateral penrose drains LLL/RLQ draining serosanguinous fluid., dressing replaced around drains. Vertical Abdominal incision, packed wet to dry. GU: Small amount of blood per urethral meatus, Foley in  place  Ext: trace edema, no erythema, SCD's in place.  Skin: sacral hyperpigmentation and small ~1 cm of skin breakdown. Tender. Neuro: Intubated. Sedated. No focal deficits. Baseline dementia.   LABS:  CBC  Recent Labs Lab 02/18/13 0647  WBC 14.4*  HGB 13.9  HCT 39.9  PLT 141*   Coag's  Recent Labs Lab 02/18/13 2032  APTT 30  INR 1.19   BMET  Recent Labs Lab 02/18/13 0647 02/18/13 2032  NA 137 144  K 4.7 3.8  CL 97 112  CO2 22 20  BUN 125* 63*   CREATININE 3.65* 1.41*  GLUCOSE 151* 109*   Electrolytes  Recent Labs Lab 02/18/13 0647 02/18/13 2032  CALCIUM 8.4 7.2*   Sepsis Markers  Recent Labs Lab 02/18/13 0824 02/18/13 2045  LATICACIDVEN 2.9* 1.4   ABG No results found for this basename: PHART, PCO2ART, PO2ART,  in the last 168 hours Liver Enzymes No results found for this basename: AST, ALT, ALKPHOS, BILITOT, ALBUMIN,  in the last 168 hours Cardiac Enzymes No results found for this basename: TROPONINI, PROBNP,  in the last 168 hours Glucose  Recent Labs Lab 02/18/13 1937  GLUCAP 112*    Imaging Ct Abdomen Pelvis Wo Contrast  02/18/2013   CLINICAL DATA:  Fever. Recent transurethral resection of bladder tumor. Extraluminal gas collection in the left side of the pelvis.  EXAM: CT ABDOMEN AND PELVIS WITHOUT CONTRAST  TECHNIQUE: Multidetector CT imaging of the abdomen and pelvis was performed following the standard protocol without intravenous contrast.  COMPARISON:  Radiograph dated 02/18/2013 and CT scan dated 08/30/2006  FINDINGS: There are bilateral extensive lower lobe consolidative pulmonary infiltrates. No effusions. Heart size is normal. Tiny hiatal hernia.  There is extensive extraluminal air in the soft tissues of the pelvis including the presacral space and along the left side of the pelvis along the fascia of the left antral operator muscle as well as extending anteriorly along the deep fascia of the rectus muscles. This is consistent with and anaerobic infection and possible necrotizing fasciitis.  The liver, biliary tree, spleen, pancreas, adrenal glands, and kidneys demonstrate no significant abnormality. The bowel appears normal. Foley catheter is in place in the bladder with a small amount of air in the bladder.  IMPRESSION: 1. Evidence consistent with anaerobic soft tissue infection in the pelvis with extension to the fascia of the rectus muscles and of the left internal obturator muscle consistent with  necrotizing fasciitis. No visible pus collections in the pelvis. 2. Extensive bilateral lower lobe pneumonia.  Critical Value/emergent results were called by telephone at the time of interpretation on 02/18/2013 at 3:55 PM to Dr. Murray Hodgkins , who verbally acknowledged these results.   Electronically Signed   By: Rozetta Nunnery M.D.   On: 02/18/2013 15:56   US Renal  02/18/2013   CLINICAL DATA:  Acute renal failure  EXAM: RENAL/URINARY TRACT ULTRASOUND COMPLETE  COMPARISON:  01/13/2013  FINDINGS: Right Kidney:  Length: 11.2 cm.  No mass or hydronephrosis.  Left Kidney:  Length: 11.0 cm.  No mass or hydronephrosis.  Bladder:  Poorly visualized/decompressed by indwelling Foley catheter.  IMPRESSION: No hydronephrosis.  Bladder poorly visualized/decompressed by indwelling Foley catheter.   Electronically Signed   By: Julian Hy M.D.   On: 02/18/2013 13:36   Dg Chest Portable 1 View  02/18/2013   CLINICAL DATA:  Evaluate right central venous line insertion  EXAM: PORTABLE CHEST - 1 VIEW  COMPARISON:  DG CHEST 1V PORT dated 02/18/2013  FINDINGS: There is a right central venous line with tip in the distal SVC. No pneumothorax. Normal cardiac silhouette. There is bibasilar airspace disease greater on the right which is not to be changed from prior. No upper lobe airspace disease.  IMPRESSION: 1. Interval placement right central venous line without complication. 2. Bibasilar asymmetric airspace disease is not changed.   Electronically Signed   By: Suzy Bouchard M.D.   On: 02/18/2013 21:19   Dg Chest Portable 1 View  02/18/2013   CLINICAL DATA:  Hypoxia, COPD  EXAM: PORTABLE CHEST - 1 VIEW  COMPARISON:  01/27/2013  FINDINGS: Cardiac shadow is stable. Bilateral basilar infiltrates are now seen new from the prior exam. The bony structures are within normal limits.  IMPRESSION: Bibasilar infiltrates.   Electronically Signed   By: Inez Catalina M.D.   On: 02/18/2013 08:28   Dg Abd 2 Views  02/18/2013    CLINICAL DATA:  Bladder tumor, urothelial carcinoma, possible vomiting, post TURBT 02/13/2013  EXAM: ABDOMEN - 2 VIEW  COMPARISON:  None  FINDINGS: Bibasilar infiltrates question pneumonia versus aspiration.  Air-filled nondistended small bowel loops the mid abdomen, question postoperative ileus.  Bubbly gas identified in the pelvis, could be related to stool within the distal colon but extending farther left lateral than is typically seen with rectal vault stool, raising question of extraperitoneal gas in the left pelvis.  Bones unremarkable.  No urinary tract calcification.  No definite free intraperitoneal air.  IMPRESSION: Bibasilar opacities question pneumonia versus aspiration.  Potential postoperative ileus.  Bubbly gas extending farther left laterally in the inferior pelvis than is typically seen with stool, question prominent stool versus extraperitoneal extraluminal gas in patient who has had a recent cystoscopy and TURBT ; CT imaging of the abdomen and pelvis with IV and oral contrast recommended.  Findings called to Dr. Sarajane Jews on 02/18/2013 at 1315 hr.   Electronically Signed   By: Lavonia Dana M.D.   On: 02/18/2013 13:16   CXR: Endotracheal tube appears in good position. New right lower lobe atelectasis collapse rt base?Marland Kitchen Interval placement NG tube with side port is just above GE junction.  ASSESSMENT / PLAN:  PULMONARY  A: PNA, r/o aspiration vs HCAP      Intubated s/p OR  Rt base rll collapse? P:  pcxr noted , add chest pt, increase peep 8 ABG noted, increase rate abg to follow in 2 hrs pcxr in am , may need bronch Add mucomysts x 48 hrs  CARDIOVASCULAR  A: Severe sepsis, likely septic shock  P:  Repeat lactic acid nl Add levophed if MAP less 65, does not require as of now  Cortisol 21.4 (borderline value and pressure), Will add stress dose steroids today CVP 5; Goal cvp <10, bolus further Dc propofol  RENAL  A: ARF, ATN likely frmo septic shock -resolving? P:  bmet now  and then q12h  Lactic acid repeat NL Cr improving 3.65 >> 1.41 UOP: 1.9L/24h  GASTROINTESTINAL  A: NPO, necrotizing pelvic infection, see ID      S/p Exploratory laparotomy with drainage of retroperitoneal and preperitoneal soft tissue infection P:  ppi  Start TF  HEMATOLOGIC  A: dvt prevention  P:  scd  sub q heparin   INFECTIOUS  A: Necrotizing pelvic infection s/p bladder surgery, likely related to bladder as source, HCAP vs aspiration  S/p Exploratory laparotomy with drainage of retroperitoneal and preperitoneal soft tissue infection P:  Drainage ~ 75 ml  clinda for toxin inhibition with  borderline bP Mero, vanc  BC to follow sputum  May need bronch with BAL Wound care sacrum  Surgery and urology following  ENDOCRINE  A: R/o rel AI  P:  Cortisol 21.4 Will start stress dose steroids today  ssi   NEUROLOGIC  A: Dementia, pain, vent dyschrony P:  Diprivan  gtt dc with drop in BP  prn fent to fent drip No benzo   TODAY'S SUMMARY: post op, low BP, remains on propofol - dc, add TF, continue aggressive abx, may need bronch   02/19/2013, 12:28 AM  Ccm time 30 min  I have fully examined this patient and agree with above findings.    And edited in fill  Lavon Paganini. Titus Mould, MD, [place] Pgr: [facility]

## 2013-02-19 NOTE — Progress Notes (Signed)
Agree with continued drainage and abx Midline wound clean Disposition per urology

## 2013-02-19 NOTE — Preoperative (Signed)
Beta Blockers   Reason not to administer Beta Blockers:Not Applicable 

## 2013-02-19 NOTE — Anesthesia Postprocedure Evaluation (Signed)
  Anesthesia Post-op Note  Patient: Jason [name]  Procedure(s) Performed: Procedure(s): EXPLORATORY LAPAROTOMY drainage of retroperitoneal abscess, drainage of Pre-Peritoneal abscess and Exploration of bladder perforation (N/A)  Patient Location: ICU  Anesthesia Type:General  Level of Consciousness: sedated  Airway and Oxygen Therapy: Patient remains intubated per anesthesia plan and Patient placed on Ventilator (see vital sign flow sheet for setting)  Post-op Pain: pt sedated- unable to assess  Post-op Assessment: Post-op Vital signs reviewed, Patient's Cardiovascular Status Stable and Respiratory Function Stable  Post-op Vital Signs: Reviewed and stable  Complications: No apparent anesthesia complications

## 2013-02-19 NOTE — Progress Notes (Signed)
Replaced with a tube holder from pink tape.  During this time patient heart rate increased to 188.  RN and MD aware.  RT will continue to monitor.

## 2013-02-19 NOTE — Consult Note (Signed)
WOC wound consult note Reason for Consult: evaluation of sacral wound. Pt on vent, unable to obtain history from this patient, this patient does have sDTI (suspected deep tissue injury) over a large area of his sacrum.  Wonder about his ambulatory status prior to his admission.  Wound type: sDTI (deep tissue injury) Pressure Ulcer POA: Yes Measurement:10cm x 10cm x 0 with some superficial skin sloughing in the most central portion in the gluteal cleft Wound EAV:WUJW, purple tissue Drainage (amount, consistency, odor) none Periwound:intact Dressing procedure/placement/frequency: Sacral foam for protection, however will need staff to continue to assess at each shift for changes and evolution of this wound. On Sport bed for pressure redistribution.  Many times DTI will evolve into very deep pressure ulcers in the very high risk patients such as this, so will need to be closely monitored   WOC will monitor weekly and if needed reconsult for changes noted by bedside staff.  Osmond Steckman [place] RN,CWOCN [phone]

## 2013-02-19 NOTE — Progress Notes (Signed)
Spoke with ICU team Making a lot of urine with fluid challenges Moderate drainage to abdominal dressing on Hx and Px- ? Any urine with bloody discharge Afebrile Labs pending Continue with supportive care My cell is [phone] for general surgery if needed

## 2013-02-19 NOTE — Progress Notes (Signed)
INITIAL NUTRITION ASSESSMENT  DOCUMENTATION CODES Per approved criteria  -Not Applicable   INTERVENTION:  Utilize 73M PEPuP Protocol: initiate TF via OGT with Vital AF 1.2 at 25 ml/h on day 1; on day 2, increase to goal rate of 75 ml/h (1800 ml per day) to provide 2160 kcals, 135 gm protein, 1460 ml free water daily.  NUTRITION DIAGNOSIS: Inadequate oral intake related to inability to eat as evidenced by NPO status.   Goal: Intake to meet >90% of estimated nutrition needs.  Monitor:  TF tolerance/adequacy, weight trend, labs, vent status.  Reason for Assessment: MD Consult for TF initiation and management.  68 y.o. male  Admitting Dx: Sepsis  ASSESSMENT: Patient is a 68 yo AAF s/p TURP procedure, presented to [facility] ED after pulling out his foley catheter. Initial lab work revealed ARF, UTI, suspected aspiration pneumonia, and sepsis. CT scan abd/pelvis consistent w/ anaerobic soft tissue infection in the pelvis with extension to the fascia of the rectus muscles and of the left internal obturator muscle consistent with necrotizing fasciitis.  Patient is currently intubated on ventilator support.  MV: 13.6 L/min Temp (24hrs), Avg:99.4 F (37.4 C), Min:97.8 F (36.6 C), Max:101.6 F (38.7 C)  Patient is at risk for malnutrition given moderate muscle mass loss and sacral skin breakdown. Unable to obtain any other nutrition history at this time.  Nutrition Focused Physical Exam:  Subcutaneous Fat:  Orbital Region: WNL Upper Arm Region: WNL Thoracic and Lumbar Region: NA  Muscle:  Temple Region: WNL Clavicle Bone Region: moderate depletion Clavicle and Acromion Bone Region: moderate depletion Scapular Bone Region: NA Dorsal Hand: WNL Patellar Region: WNL Anterior Thigh Region: mild depletion Posterior Calf Region: WNL  Edema: none   Height: Ht Readings from Last 1 Encounters:  02/18/13 5\' 10"  (1.778 m)    Weight: Wt Readings from Last 1 Encounters:   02/19/13 158 lb 1.1 oz (71.7 kg)    Ideal Body Weight: 75.5 kg  % Ideal Body Weight: 95%  Wt Readings from Last 10 Encounters:  02/19/13 158 lb 1.1 oz (71.7 kg)  02/19/13 158 lb 1.1 oz (71.7 kg)  02/10/13 149 lb (67.586 kg)  05/23/09 160 lb 4 oz (72.689 kg)  03/23/09 166 lb 8 oz (75.524 kg)  03/15/09 163 lb 4 oz (74.05 kg)  12/07/08 160 lb 2.1 oz (72.635 kg)  11/08/08 165 lb 4 oz (74.957 kg)  01/29/08 164 lb (74.39 kg)  09/30/07 158 lb (71.668 kg)    Usual Body Weight: 149 lb (1 week ago)  % Usual Body Weight: 106%  BMI:  Body mass index is 22.68 kg/(m^2).  Estimated Nutritional Needs: Kcal: 2105 Protein: 120-135 gm Fluid: 2.1 L  Skin: abdominal incision; black peeling skin on sacrum  Diet Order: NPO  EDUCATION NEEDS: -Education not appropriate at this time   Intake/Output Summary (Last 24 hours) at 02/19/13 1155 Last data filed at 02/19/13 1100  Gross per 24 hour  Intake 6842.91 ml  Output   2705 ml  Net 4137.91 ml    Last BM: 2/18   Labs:   Recent Labs Lab 02/18/13 0647 02/18/13 2032 02/19/13 0500  NA 137 144 143  K 4.7 3.8 4.5  CL 97 112 109  CO2 22 20 19   BUN 125* 63* 51*  CREATININE 3.65* 1.41* 1.13  CALCIUM 8.4 7.2* 7.5*  MG  --   --  2.5  PHOS  --   --  2.7  GLUCOSE 151* 109* 103*    CBG (  last 3)   Recent Labs  02/19/13 0036 02/19/13 0349 02/19/13 0818  GLUCAP 109* 132* 76    Scheduled Meds: . acetylcysteine  4 mL Nebulization BID  . albuterol  2.5 mg Nebulization BID  . antiseptic oral rinse  15 mL Mouth Rinse QID  . chlorhexidine  15 mL Mouth Rinse BID  . clindamycin (CLEOCIN) IV  900 mg Intravenous 4 times per day  . etomidate      . feeding supplement (VITAL HIGH PROTEIN)  1,000 mL Per Tube Q24H  . fentaNYL  50 mcg Intravenous Once  . folic acid  1 mg Intravenous Daily  . [START ON 02/20/2013] heparin subcutaneous  5,000 Units Subcutaneous 3 times per day  . hydrocortisone sod succinate (SOLU-CORTEF) inj  50 mg  Intravenous 4 times per day  . insulin aspart  0-9 Units Subcutaneous 6 times per day  . meropenem (MERREM) IV  500 mg Intravenous Q12H  . metoprolol      . pantoprazole (PROTONIX) IV  40 mg Intravenous Q24H  . sodium chloride  1,000 mL Intravenous Once  . sodium chloride  3 mL Intravenous Q12H  . thiamine  100 mg Intravenous Daily  . vancomycin  750 mg Intravenous Q24H    Continuous Infusions: . sodium chloride 150 mL/hr at 02/19/13 0030  . fentaNYL infusion INTRAVENOUS 50 mcg/hr (02/19/13 0800)  . norepinephrine (LEVOPHED) Adult infusion    . phenylephrine (NEO-SYNEPHRINE) Adult infusion      Past Medical History  Diagnosis Date  . Hypertension   . Stroke     Dementia, otherwise, no residual  . Bladder mass 02-10-13    surgery planned for this  . 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 COPD (chronic obstructive pulmonary disease)     previous history and current smoking  . 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 Dementia 02/18/2013    Past Surgical History  Procedure Laterality Date  . Transurethral resection of bladder tumor with gyrus (turbt-gyrus) N/A 02/13/2013    Procedure: TRANSURETHRAL RESECTION OF BLADDER TUMOR WITH GYRUS (TURBT-GYRUS), bladder biopsies;  Surgeon: Ardis Hughs, MD;  Location: [facility]S;  Service: Urology;  Laterality: N/A;  . Cystoscopy/retrograde/ureteroscopy Bilateral 02/13/2013    Procedure: BILATERAL RETROGRADE;  Surgeon: Ardis Hughs, MD;  Location: [facility] ORS;  Service: Urology;  Laterality: Bilateral;    Molli Barrows, RD, LDN, [place] Pager [phone] After Hours Pager [phone]

## 2013-02-19 NOTE — Progress Notes (Signed)
Patient ID: Jason [name], male   DOB: 10/07/1945, 68 y.o.   MRN: [id number] 1 Day Post-Op  Subjective: Pt intubated.  bronched due to lower lobe consolidation.  Pus aspirated from lung.  Objective: Vital signs in last 24 hours: Temp:  [97.8 F (36.6 C)-101.6 F (38.7 C)] 97.8 F (36.6 C) (02/19 0914) Pulse Rate:  [31-126] 89 (02/19 0800) Resp:  [12-34] 12 (02/19 0800) BP: (90-160)/(44-98) 99/53 mmHg (02/19 0800) SpO2:  [81 %-100 %] 98 % (02/19 0827) FiO2 (%):  [50 %] 50 % (02/19 0827) Weight:  [149 lb 7.6 oz (67.8 kg)-158 lb 15.2 oz (72.1 kg)] 158 lb 1.1 oz (71.7 kg) (02/19 0500) Last BM Date: 02/18/13  Intake/Output from previous day: 02/18 0701 - 02/19 0700 In: 6672.9 [P.O.:1006; I.V.:3836.9; NG/GT:30; IV Piggyback:800] Out: 2180 [Urine:2080; Drains:75; Blood:25] Intake/Output this shift: Total I/O In: 310 [I.V.:310] Out: 250 [Urine:200; Drains:50]  PE: Abd: soft, penrose drains in place on right and left side.  JP drain serous vs urine drainage.  Midline wound is clean, muscle layer is purple, but not necrotic.  Lab Results:   Recent Labs  02/18/13 0647  WBC 14.4*  HGB 13.9  HCT 39.9  PLT 141*   BMET  Recent Labs  02/18/13 2032 02/19/13 0500  NA 144 143  K 3.8 4.5  CL 112 109  CO2 20 19  GLUCOSE 109* 103*  BUN 63* 51*  CREATININE 1.41* 1.13  CALCIUM 7.2* 7.5*   PT/INR  Recent Labs  02/18/13 2032  LABPROT 14.8  INR 1.19   CMP     Component Value Date/Time   NA 143 02/19/2013 0500   K 4.5 02/19/2013 0500   CL 109 02/19/2013 0500   CO2 19 02/19/2013 0500   GLUCOSE 103* 02/19/2013 0500   BUN 51* 02/19/2013 0500   CREATININE 1.13 02/19/2013 0500   CALCIUM 7.5* 02/19/2013 0500   PROT 5.1* 02/19/2013 0500   ALBUMIN 1.8* 02/19/2013 0500   AST 43* 02/19/2013 0500   ALT 42 02/19/2013 0500   ALKPHOS 105 02/19/2013 0500   BILITOT 1.5* 02/19/2013 0500   GFRNONAA 64* 02/19/2013 0500   GFRAA 75* 02/19/2013 0500   Lipase  No results found for this basename:  lipase       Studies/Results: Ct Abdomen Pelvis Wo Contrast  02/18/2013   CLINICAL DATA:  Fever. Recent transurethral resection of bladder tumor. Extraluminal gas collection in the left side of the pelvis.  EXAM: CT ABDOMEN AND PELVIS WITHOUT CONTRAST  TECHNIQUE: Multidetector CT imaging of the abdomen and pelvis was performed following the standard protocol without intravenous contrast.  COMPARISON:  Radiograph dated 02/18/2013 and CT scan dated 08/30/2006  FINDINGS: There are bilateral extensive lower lobe consolidative pulmonary infiltrates. No effusions. Heart size is normal. Tiny hiatal hernia.  There is extensive extraluminal air in the soft tissues of the pelvis including the presacral space and along the left side of the pelvis along the fascia of the left antral operator muscle as well as extending anteriorly along the deep fascia of the rectus muscles. This is consistent with and anaerobic infection and possible necrotizing fasciitis.  The liver, biliary tree, spleen, pancreas, adrenal glands, and kidneys demonstrate no significant abnormality. The bowel appears normal. Foley catheter is in place in the bladder with a small amount of air in the bladder.  IMPRESSION: 1. Evidence consistent with anaerobic soft tissue infection in the pelvis with extension to the fascia of the rectus muscles and of the left internal obturator muscle  consistent with necrotizing fasciitis. No visible pus collections in the pelvis. 2. Extensive bilateral lower lobe pneumonia.  Critical Value/emergent results were called by telephone at the time of interpretation on 02/18/2013 at 3:55 PM to Dr. Brendia Sacks , who verbally acknowledged these results.   Electronically Signed   By: Geanie Cooley M.D.   On: 02/18/2013 15:56   US Renal  02/18/2013   CLINICAL DATA:  Acute renal failure  EXAM: RENAL/URINARY TRACT ULTRASOUND COMPLETE  COMPARISON:  01/13/2013  FINDINGS: Right Kidney:  Length: 11.2 cm.  No mass or  hydronephrosis.  Left Kidney:  Length: 11.0 cm.  No mass or hydronephrosis.  Bladder:  Poorly visualized/decompressed by indwelling Foley catheter.  IMPRESSION: No hydronephrosis.  Bladder poorly visualized/decompressed by indwelling Foley catheter.   Electronically Signed   By: Charline Bills M.D.   On: 02/18/2013 13:36   Dg Chest Port 1 View  02/19/2013   CLINICAL DATA:  Post bronchoscopy  EXAM: PORTABLE CHEST - 1 VIEW  COMPARISON:  Portable exam 0952 hr compared to the 0026 hr  FINDINGS: Tip of endotracheal tube 11 cm above carina.  Right jugular central venous catheter tip projects over SVC.  Normal heart size and mediastinal contours.  Bibasilar opacities question infiltrate versus atelectasis greater on right.  Upper lobes clear.  No pleural effusion or pneumothorax.  IMPRESSION: Bibasilar opacities greater on right question atelectasis versus infiltrate.  High endotracheal tube position 11 cm above carina, consider advancing tube 5 cm for more stable positioning within the thoracic trachea.  Findings called to [facility] on [facility] [place] on 02/19/2013 at 1004 hr.   Electronically Signed   By: Ulyses Southward M.D.   On: 02/19/2013 10:04   Dg Chest Port 1 View  02/19/2013   CLINICAL DATA:  Endotracheal tube placement  EXAM: PORTABLE CHEST - 1 VIEW  COMPARISON:  DG CHEST 1V PORT dated 02/18/2013  FINDINGS: Endotracheal tube is positioned 6 cm from the carina. NG to extends into the stomach. Side port is just above the GE junction. Right central venous line placement of the distal SVC.  There is new right lower lobe atelectasis.  Upper lungs are clear.  IMPRESSION: 1. Endotracheal tube appears in good position. 2. New right lower lobe atelectasis. 3. Interval placement NG tube with side port is just above GE junction.   Electronically Signed   By: Genevive Bi M.D.   On: 02/19/2013 00:46   Dg Chest Portable 1 View  02/18/2013   CLINICAL DATA:  Evaluate right central venous line insertion  EXAM: PORTABLE  CHEST - 1 VIEW  COMPARISON:  DG CHEST 1V PORT dated 02/18/2013  FINDINGS: There is a right central venous line with tip in the distal SVC. No pneumothorax. Normal cardiac silhouette. There is bibasilar airspace disease greater on the right which is not to be changed from prior. No upper lobe airspace disease.  IMPRESSION: 1. Interval placement right central venous line without complication. 2. Bibasilar asymmetric airspace disease is not changed.   Electronically Signed   By: Genevive Bi M.D.   On: 02/18/2013 21:19   Dg Chest Portable 1 View  02/18/2013   CLINICAL DATA:  Hypoxia, COPD  EXAM: PORTABLE CHEST - 1 VIEW  COMPARISON:  01/27/2013  FINDINGS: Cardiac shadow is stable. Bilateral basilar infiltrates are now seen new from the prior exam. The bony structures are within normal limits.  IMPRESSION: Bibasilar infiltrates.   Electronically Signed   By: Alcide Clever M.D.   On:  02/18/2013 08:28   Dg Abd 2 Views  02/18/2013   CLINICAL DATA:  Bladder tumor, urothelial carcinoma, possible vomiting, post TURBT 02/13/2013  EXAM: ABDOMEN - 2 VIEW  COMPARISON:  None  FINDINGS: Bibasilar infiltrates question pneumonia versus aspiration.  Air-filled nondistended small bowel loops the mid abdomen, question postoperative ileus.  Bubbly gas identified in the pelvis, could be related to stool within the distal colon but extending farther left lateral than is typically seen with rectal vault stool, raising question of extraperitoneal gas in the left pelvis.  Bones unremarkable.  No urinary tract calcification.  No definite free intraperitoneal air.  IMPRESSION: Bibasilar opacities question pneumonia versus aspiration.  Potential postoperative ileus.  Bubbly gas extending farther left laterally in the inferior pelvis than is typically seen with stool, question prominent stool versus extraperitoneal extraluminal gas in patient who has had a recent cystoscopy and TURBT ; CT imaging of the abdomen and pelvis with IV and oral  contrast recommended.  Findings called to Dr. Sarajane Jews on 02/18/2013 at 1315 hr.   Electronically Signed   By: Lavonia Dana M.D.   On: 02/18/2013 13:16    Anti-infectives: Anti-infectives   Start     Dose/Rate Route Frequency Ordered Stop   02/19/13 1000  vancomycin (VANCOCIN) IVPB 750 mg/150 ml premix     750 mg 150 mL/hr over 60 Minutes Intravenous Every 24 hours 02/18/13 1031     02/18/13 1800  clindamycin (CLEOCIN) IVPB 900 mg     900 mg 100 mL/hr over 30 Minutes Intravenous 4 times per day 02/18/13 1626     02/18/13 1800  meropenem (MERREM) 500 mg in sodium chloride 0.9 % 50 mL IVPB     500 mg 100 mL/hr over 30 Minutes Intravenous Every 12 hours 02/18/13 1649     02/18/13 1200  piperacillin-tazobactam (ZOSYN) IVPB 2.25 g  Status:  Discontinued     2.25 g 100 mL/hr over 30 Minutes Intravenous 4 times per day 02/18/13 1026 02/18/13 1626   02/18/13 1100  vancomycin (VANCOCIN) 1,500 mg in sodium chloride 0.9 % 500 mL IVPB     1,500 mg 250 mL/hr over 120 Minutes Intravenous  Once 02/18/13 1026 02/18/13 1300   02/18/13 0800  cefTRIAXone (ROCEPHIN) 1 g in dextrose 5 % 50 mL IVPB     1 g Intravenous  Once 02/18/13 0752 02/18/13 0848       Assessment/Plan  1. POD 1, s/p ex lap with drainage of RTP and preperitoneal soft tissue infection 2. Bladder perforation 3. Sepsis 4. VDRF  Plan: 1. NS WD dressing changes to midline wound 2. Cont with JP drain and penrose drains 3. Cont abx therapy 4. Defer management of bladder issues to urology   LOS: 1 day    Tekila Caillouet E 02/19/2013, 10:10 AM Pager: [phone]

## 2013-02-19 NOTE — Progress Notes (Signed)
ANTIBIOTIC CONSULT NOTE - FOLLOW UP  Pharmacy Consult for vancomycin, clindamycin, meropenem Indication: sepsis  No Known Allergies  Patient Measurements: Height: 5\' 10"  (177.8 cm) Weight: 158 lb 1.1 oz (71.7 kg) IBW/kg (Calculated) : 73   Vital Signs: Temp: 97.5 F (36.4 C) (02/19 1235) Temp src: Oral (02/19 1235) BP: 126/57 mmHg (02/19 1400) Pulse Rate: 99 (02/19 1400) Intake/Output from previous day: 02/18 0701 - 02/19 0700 In: 6172.9 [P.O.:1006; I.V.:3336.9; NG/GT:30; IV Piggyback:800] Out: 2180 [Urine:2080; Drains:75; Blood:25] Intake/Output from this shift: Total I/O In: 835 [I.V.:635; IV Piggyback:200] Out: 975 [Urine:875; Drains:100]  Labs:  Recent Labs  02/18/13 0647 02/18/13 2032 02/19/13 0500 02/19/13 1230  WBC 14.4*  --   --   --   HGB 13.9  --   --   --   PLT 141*  --   --   --   CREATININE 3.65* 1.41* 1.13 1.02   Estimated Creatinine Clearance: 69.3 ml/min (by C-G formula based on Cr of 1.02). No results found for this basename: Letta Median, VANCORANDOM, GENTTROUGH, GENTPEAK, GENTRANDOM, TOBRATROUGH, TOBRAPEAK, TOBRARND, AMIKACINPEAK, AMIKACINTROU, AMIKACIN,  in the last 72 hours   Microbiology: Recent Results (from the past 720 hour(s))  URINE CULTURE     Status: None   Collection Time    02/18/13  7:20 AM      Result Value Ref Range Status   Specimen Description URINE, CLEAN CATCH   Final   Special Requests NONE   Final   Culture  Setup Time     Final   Value: 02/18/2013 10:26     Performed at [facility] Count     Final   Value: >=100,000 COLONIES/ML     Performed at [facility]   Culture     Final   Value: ESCHERICHIA COLI     Performed at [facility]   Report Status PENDING   Incomplete  CULTURE, BLOOD (ROUTINE X 2)     Status: None   Collection Time    02/18/13  8:15 AM      Result Value Ref Range Status   Specimen Description BLOOD SITE NOT SPECIFIED DRAWN BY RN   Final   Special  Requests BOTTLES DRAWN AEROBIC AND ANAEROBIC 4CC   Final   Culture NO GROWTH 1 DAY   Final   Report Status PENDING   Incomplete  CULTURE, BLOOD (ROUTINE X 2)     Status: None   Collection Time    02/18/13  8:30 AM      Result Value Ref Range Status   Specimen Description BLOOD RIGHT ARM   Final   Special Requests BOTTLES DRAWN AEROBIC ONLY 6CC   Final   Culture NO GROWTH 1 DAY   Final   Report Status PENDING   Incomplete  MRSA PCR SCREENING     Status: None   Collection Time    02/18/13 10:13 AM      Result Value Ref Range Status   MRSA by PCR NEGATIVE  NEGATIVE Final   Comment:            The GeneXpert MRSA Assay (FDA     approved for NASAL specimens     only), is one component of a     comprehensive MRSA colonization     surveillance program. It is not     intended to diagnose MRSA     infection nor to guide or     monitor treatment for  MRSA infections.  AFB CULTURE WITH SMEAR     Status: None   Collection Time    02/19/13 12:28 AM      Result Value Ref Range Status   Specimen Description ABSCESS ABDOMEN   Final   Special Requests PRE PERITONEAL   Final   ACID FAST SMEAR     Final   Value: NO ACID FAST BACILLI SEEN     Performed at [facility]   Culture     Final   Value: CULTURE WILL BE EXAMINED FOR 6 WEEKS BEFORE ISSUING A FINAL REPORT     Performed at [facility]   Report Status PENDING   Incomplete  ANAEROBIC CULTURE     Status: None   Collection Time    02/19/13 12:30 AM      Result Value Ref Range Status   Specimen Description ABSCESS ABDOMEN   Final   Special Requests PRE PERITONEAL   Final   Gram Stain     Final   Value: ABUNDANT WBC PRESENT,BOTH PMN AND MONONUCLEAR     NO SQUAMOUS EPITHELIAL CELLS SEEN     MODERATE GRAM NEGATIVE RODS     Performed at [facility]   Culture PENDING   Incomplete   Report Status PENDING   Incomplete  CULTURE, ROUTINE-ABSCESS     Status: None   Collection Time    02/19/13 12:31 AM      Result  Value Ref Range Status   Specimen Description ABSCESS ABDOMEN   Final   Special Requests PRE PERITONEAL   Final   Gram Stain     Final   Value: ABUNDANT WBC PRESENT,BOTH PMN AND MONONUCLEAR     NO SQUAMOUS EPITHELIAL CELLS SEEN     MODERATE GRAM NEGATIVE RODS     Performed at [facility]   Culture PENDING   Incomplete   Report Status PENDING   Incomplete  FUNGUS CULTURE W SMEAR     Status: None   Collection Time    02/19/13 12:33 AM      Result Value Ref Range Status   Specimen Description ABSCESS ABDOMEN   Final   Special Requests PRE PERITONEAL   Final   Fungal Smear     Final   Value: NO YEAST OR FUNGAL ELEMENTS SEEN     Performed at [facility]   Culture     Final   Value: CULTURE IN PROGRESS FOR FOUR WEEKS     Performed at [facility]   Report Status PENDING   Incomplete  CULTURE, RESPIRATORY (NON-EXPECTORATED)     Status: None   Collection Time    02/19/13  5:38 AM      Result Value Ref Range Status   Specimen Description TRACHEAL ASPIRATE   Final   Special Requests NONE   Final   Gram Stain     Final   Value: NO WBC SEEN     NO SQUAMOUS EPITHELIAL CELLS SEEN     NO ORGANISMS SEEN     Performed at [facility]   Culture PENDING   Incomplete   Report Status PENDING   Incomplete  CULTURE, RESPIRATORY (NON-EXPECTORATED)     Status: None   Collection Time    02/19/13  9:53 AM      Result Value Ref Range Status   Specimen Description TRACHEAL ASPIRATE   Final   Special Requests NONE   Final   Gram Stain  Final   Value: FEW WBC PRESENT, PREDOMINANTLY PMN     RARE SQUAMOUS EPITHELIAL CELLS PRESENT     RARE GRAM POSITIVE COCCI     IN PAIRS     Performed at [facility]   Culture PENDING   Incomplete   Report Status PENDING   Incomplete    Anti-infectives   Start     Dose/Rate Route Frequency Ordered Stop   02/19/13 1000  vancomycin (VANCOCIN) IVPB 750 mg/150 ml premix     750 mg 150 mL/hr over 60 Minutes Intravenous  Every 24 hours 02/18/13 1031     02/18/13 1800  clindamycin (CLEOCIN) IVPB 900 mg     900 mg 100 mL/hr over 30 Minutes Intravenous 4 times per day 02/18/13 1626     02/18/13 1800  meropenem (MERREM) 500 mg in sodium chloride 0.9 % 50 mL IVPB     500 mg 100 mL/hr over 30 Minutes Intravenous Every 12 hours 02/18/13 1649     02/18/13 1200  piperacillin-tazobactam (ZOSYN) IVPB 2.25 g  Status:  Discontinued     2.25 g 100 mL/hr over 30 Minutes Intravenous 4 times per day 02/18/13 1026 02/18/13 1626   02/18/13 1100  vancomycin (VANCOCIN) 1,500 mg in sodium chloride 0.9 % 500 mL IVPB     1,500 mg 250 mL/hr over 120 Minutes Intravenous  Once 02/18/13 1026 02/18/13 1300   02/18/13 0800  cefTRIAXone (ROCEPHIN) 1 g in dextrose 5 % 50 mL IVPB     1 g Intravenous  Once 02/18/13 0752 02/18/13 0848      Assessment: 87 YOM transferred from [facility] after he pulled out his foley catheter after TURP procedure. CT scan of abdomen with evidence of soft tissue infection, +nectrotizing fascitis, also with extensive bilateral lower lobe consolidation. S/p bronch this morning. WBC 14.78m Tmax/24h 102.3  Goal of Therapy:  Vancomycin trough level 15-20 mcg/ml  Plan:  1. Continue clindamycin 900mg  IV q8h 2. Change meropenem to 1g IV q8h 3. Change vancomycin to 750mg  IV q12h 4. Follow renal function, clinical progression, c/s, LOT, trough at [place] [facility]. Alishba [place], PharmD, BCPS Clinical Pharmacist Pager: [phone] 02/19/2013 2:31 PM

## 2013-02-19 NOTE — Transfer of Care (Signed)
Immediate Anesthesia Transfer of Care Note  Patient: Jason [name]  Procedure(s) Performed: Procedure(s): EXPLORATORY LAPAROTOMY drainage of retroperitoneal abscess, drainage of Pre-Peritoneal abscess and Exploration of bladder perforation (N/A)  Patient Location: ICU  Anesthesia Type:General  Level of Consciousness: Patient remains intubated per anesthesia plan  Airway & Oxygen Therapy: Patient remains intubated per anesthesia plan and Patient placed on Ventilator (see vital sign flow sheet for setting)  Post-op Assessment: Report given to PACU RN and Post -op Vital signs reviewed and stable  Post vital signs: Reviewed and stable  Complications: No apparent anesthesia complications

## 2013-02-19 NOTE — Progress Notes (Signed)
Pt back from OR.  Dtr Minus Liberty,  sister Stanton Kidney, and brother in law, [place] phoned and updated.  All questions answered and support given.

## 2013-02-19 NOTE — Procedures (Signed)
Bronchoscopy Procedure Note JOSELUIS ALESSIO [id number] 08-Jun-1945  Procedure: Bronchoscopy Indications: Diagnostic evaluation of the airways, Obtain specimens for culture and/or other diagnostic studies and Remove secretions  Procedure Details Consent: Risks of procedure as well as the alternatives and risks of each were explained to the (patient/caregiver).  Consent for procedure obtained. Time Out: Verified patient identification, verified procedure, site/side was marked, verified correct patient position, special equipment/implants available, medications/allergies/relevent history reviewed, required imaging and test results available.  Performed  In preparation for procedure, patient was given 100% FiO2 and bronchoscope lubricated. Sedation: Etomidate  Airway entered and the following bronchi were examined: RUL, RML, RLL, LUL, LLL and Bronchi.   Procedures performed: Brushings performed Bronchoscope removed.  , Patient placed back on 100% FiO2 at conclusion of procedure.    Evaluation Hemodynamic Status: BP stable throughout; O2 sats: stable throughout Patient's Current Condition: stable Specimens:  Sent purulent fluid Complications: No apparent complications Patient did tolerate procedure well.   FEINSTEIN,DANIEL J.  1. Complete collapswe thick white pus all BI, RMl< RLL, LLL, all suctioned clear 2. mucomysts 4 cc 20% to BI, suctioned clear 3. BAl RML, thick whit epus  Lavon Paganini. Titus Mould, MD, Dana Pgr: [facility]  02/19/2013

## 2013-02-20 ENCOUNTER — Inpatient Hospital Stay (HOSPITAL_COMMUNITY): Payer: Medicare (Managed Care)

## 2013-02-20 ENCOUNTER — Encounter (HOSPITAL_COMMUNITY): Payer: Self-pay | Admitting: Surgery

## 2013-02-20 LAB — BASIC METABOLIC PANEL
BUN: 39 mg/dL — ABNORMAL HIGH (ref 6–23)
CALCIUM: 8 mg/dL — AB (ref 8.4–10.5)
CO2: 22 mEq/L (ref 19–32)
Chloride: 114 mEq/L — ABNORMAL HIGH (ref 96–112)
Creatinine, Ser: 1.02 mg/dL (ref 0.50–1.35)
GFR calc non Af Amer: 73 mL/min — ABNORMAL LOW (ref >90)
GFR, EST AFRICAN AMERICAN: 85 mL/min — AB (ref >90)
Glucose, Bld: 187 mg/dL — ABNORMAL HIGH (ref 70–99)
POTASSIUM: 4.3 meq/L (ref 3.7–5.3)
SODIUM: 147 meq/L (ref 137–147)

## 2013-02-20 LAB — GLUCOSE, CAPILLARY
GLUCOSE-CAPILLARY: 177 mg/dL — AB (ref 70–99)
Glucose-Capillary: 183 mg/dL — ABNORMAL HIGH (ref 70–99)
Glucose-Capillary: 182 mg/dL — ABNORMAL HIGH (ref 70–99)
Glucose-Capillary: 159 mg/dL — ABNORMAL HIGH (ref 70–99)
Glucose-Capillary: 134 mg/dL — ABNORMAL HIGH (ref 70–99)
Glucose-Capillary: 129 mg/dL — ABNORMAL HIGH (ref 70–99)

## 2013-02-20 LAB — MAGNESIUM: MAGNESIUM: 2.5 mg/dL (ref 1.5–2.5)

## 2013-02-20 LAB — PHOSPHORUS: Phosphorus: 2.7 mg/dL (ref 2.3–4.6)

## 2013-02-20 MED ORDER — PHENAZOPYRIDINE HCL 100 MG PO TABS
100.0000 mg | ORAL_TABLET | Freq: Two times a day (BID) | ORAL | Status: AC
  Administered 2013-02-20 – 2013-02-21 (×2): 100 mg via ORAL
  Filled 2013-02-20 (×2): qty 1

## 2013-02-20 MED ORDER — DEXTROSE 5 % IV SOLN
INTRAVENOUS | Status: DC
  Administered 2013-02-20: 1000 mL via INTRAVENOUS
  Administered 2013-02-22 – 2013-02-25 (×5): via INTRAVENOUS
  Administered 2013-02-26 – 2013-02-28 (×2): 60 mL via INTRAVENOUS
  Administered 2013-03-01: 03:00:00 via INTRAVENOUS

## 2013-02-20 MED ORDER — HYDROCORTISONE NA SUCCINATE PF 100 MG IJ SOLR
25.0000 mg | Freq: Four times a day (QID) | INTRAMUSCULAR | Status: DC
  Administered 2013-02-20 – 2013-02-21 (×4): 25 mg via INTRAVENOUS
  Filled 2013-02-20 (×8): qty 0.5

## 2013-02-20 NOTE — Progress Notes (Signed)
Wasted remaining 131ml of 272ml Fentanyl IV bag.  Witnessed by [facility] RN on 2MW

## 2013-02-20 NOTE — Progress Notes (Signed)
Patient ID: Jason [name], male   DOB: Dec 10, 1945, 68 y.o.   MRN: [id number] 2 Days Post-Op  Subjective: Pt intubated but more alert.  May try weaning today.  Tube feeds running  Objective: Vital signs in last 24 hours: Temp:  [97.5 F (36.4 C)-98.3 F (36.8 C)] 97.7 F (36.5 C) (02/20 0412) Pulse Rate:  [71-103] 71 (02/20 0700) Resp:  [12-28] 19 (02/20 0700) BP: (89-126)/(47-96) 120/65 mmHg (02/20 0700) SpO2:  [96 %-100 %] 100 % (02/20 0700) FiO2 (%):  [40 %-50 %] 40 % (02/20 0400) Weight:  [164 lb 3.9 oz (74.5 kg)] 164 lb 3.9 oz (74.5 kg) (02/20 0412) Last BM Date: 02/18/13  Intake/Output from previous day: 02/19 0701 - 02/20 0700 In: 4207.1 [I.V.:3272.5; NG/GT:384.6; IV Piggyback:550] Out: 2540 [Urine:2360; Drains:180] Intake/Output this shift: Total I/O In: -  Out: 175 [Urine:175]  PE: Abd: soft, dressings are moderately wet with yellow/purulent fluid.  JP with serosang output today.  Midline wound clean and packed  Lab Results:   Recent Labs  02/18/13 0647  WBC 14.4*  HGB 13.9  HCT 39.9  PLT 141*   BMET  Recent Labs  02/19/13 2032 02/20/13 0432  NA 149* 147  K 4.7 4.3  CL 116* 114*  CO2 21 22  GLUCOSE 152* 187*  BUN 39* 39*  CREATININE 1.03 1.02  CALCIUM 7.8* 8.0*   PT/INR  Recent Labs  02/18/13 2032  LABPROT 14.8  INR 1.19   CMP     Component Value Date/Time   NA 147 02/20/2013 0432   K 4.3 02/20/2013 0432   CL 114* 02/20/2013 0432   CO2 22 02/20/2013 0432   GLUCOSE 187* 02/20/2013 0432   BUN 39* 02/20/2013 0432   CREATININE 1.02 02/20/2013 0432   CALCIUM 8.0* 02/20/2013 0432   PROT 5.1* 02/19/2013 0500   ALBUMIN 1.8* 02/19/2013 0500   AST 43* 02/19/2013 0500   ALT 42 02/19/2013 0500   ALKPHOS 105 02/19/2013 0500   BILITOT 1.5* 02/19/2013 0500   GFRNONAA 73* 02/20/2013 0432   GFRAA 85* 02/20/2013 0432   Lipase  No results found for this basename: lipase       Studies/Results: Ct Abdomen Pelvis Wo Contrast  02/18/2013   CLINICAL DATA:   Fever. Recent transurethral resection of bladder tumor. Extraluminal gas collection in the left side of the pelvis.  EXAM: CT ABDOMEN AND PELVIS WITHOUT CONTRAST  TECHNIQUE: Multidetector CT imaging of the abdomen and pelvis was performed following the standard protocol without intravenous contrast.  COMPARISON:  Radiograph dated 02/18/2013 and CT scan dated 08/30/2006  FINDINGS: There are bilateral extensive lower lobe consolidative pulmonary infiltrates. No effusions. Heart size is normal. Tiny hiatal hernia.  There is extensive extraluminal air in the soft tissues of the pelvis including the presacral space and along the left side of the pelvis along the fascia of the left antral operator muscle as well as extending anteriorly along the deep fascia of the rectus muscles. This is consistent with and anaerobic infection and possible necrotizing fasciitis.  The liver, biliary tree, spleen, pancreas, adrenal glands, and kidneys demonstrate no significant abnormality. The bowel appears normal. Foley catheter is in place in the bladder with a small amount of air in the bladder.  IMPRESSION: 1. Evidence consistent with anaerobic soft tissue infection in the pelvis with extension to the fascia of the rectus muscles and of the left internal obturator muscle consistent with necrotizing fasciitis. No visible pus collections in the pelvis. 2. Extensive bilateral lower lobe  pneumonia.  Critical Value/emergent results were called by telephone at the time of interpretation on 02/18/2013 at 3:55 PM to Dr. Murray Hodgkins , who verbally acknowledged these results.   Electronically Signed   By: Rozetta Nunnery M.D.   On: 02/18/2013 15:56   US Renal  02/18/2013   CLINICAL DATA:  Acute renal failure  EXAM: RENAL/URINARY TRACT ULTRASOUND COMPLETE  COMPARISON:  01/13/2013  FINDINGS: Right Kidney:  Length: 11.2 cm.  No mass or hydronephrosis.  Left Kidney:  Length: 11.0 cm.  No mass or hydronephrosis.  Bladder:  Poorly  visualized/decompressed by indwelling Foley catheter.  IMPRESSION: No hydronephrosis.  Bladder poorly visualized/decompressed by indwelling Foley catheter.   Electronically Signed   By: Julian Hy M.D.   On: 02/18/2013 13:36   Dg Chest Port 1 View  02/20/2013   CLINICAL DATA:  68 year old male intubated. Sepsis. Initial encounter.  EXAM: PORTABLE CHEST - 1 VIEW  COMPARISON:  02/1913 and earlier.  FINDINGS: Portable AP semi upright view at 0527 hr. Endotracheal tube tip projects below the clavicles now all, about 3 cm above the carina. Enteric tube courses to the left upper quadrant, tip not included. Stable right IJ central line.  Improved bibasilar ventilation with better definition of the diaphragm. Residual patchy and reticular nodular medial lung base density greater on the right. No pneumothorax. No edema. No definite effusion. Stable cardiac size and mediastinal contours.  IMPRESSION: 1. Lines and tubes appear appropriately placed as above. 2. Improved ventilation. Residual medial lung base opacity greater on the right.   Electronically Signed   By: Lars Pinks M.D.   On: 02/20/2013 07:26   Dg Chest Port 1 View  02/19/2013   CLINICAL DATA:  Repositioning of endotracheal tube.  EXAM: PORTABLE CHEST - 1 VIEW  COMPARISON:  DG CHEST 1V PORT dated 02/19/2013  FINDINGS: The endotracheal tube tip now lies 5.7 cm above the crotch of the carina. The lungs are well-expanded. Increased density at the right lung base persists and is consistent with atelectasis or pneumonia. The cardiac silhouette is not enlarged. The pulmonary vascularity is not engorged. The esophagogastric tube tip and proximal port project below the inferior margin of the image. The right internal jugular venous catheter tip lies in the region of the midportion of the SVC.  IMPRESSION: There has been adequate interval repositioning of the endotracheal tube. The remainder of the examination is unchanged.   Electronically Signed   By: David   Martinique   On: 02/19/2013 10:26   Dg Chest Port 1 View  02/19/2013   CLINICAL DATA:  Post bronchoscopy  EXAM: PORTABLE CHEST - 1 VIEW  COMPARISON:  Portable exam 0952 hr compared to the 0026 hr  FINDINGS: Tip of endotracheal tube 11 cm above carina.  Right jugular central venous catheter tip projects over SVC.  Normal heart size and mediastinal contours.  Bibasilar opacities question infiltrate versus atelectasis greater on right.  Upper lobes clear.  No pleural effusion or pneumothorax.  IMPRESSION: Bibasilar opacities greater on right question atelectasis versus infiltrate.  High endotracheal tube position 11 cm above carina, consider advancing tube 5 cm for more stable positioning within the thoracic trachea.  Findings called to [facility] on [facility] [place] on 02/19/2013 at 1004 hr.   Electronically Signed   By: Lavonia Dana M.D.   On: 02/19/2013 10:04   Dg Chest Port 1 View  02/19/2013   CLINICAL DATA:  Endotracheal tube placement  EXAM: PORTABLE CHEST - 1 VIEW  COMPARISON:  DG CHEST 1V PORT dated 02/18/2013  FINDINGS: Endotracheal tube is positioned 6 cm from the carina. NG to extends into the stomach. Side port is just above the GE junction. Right central venous line placement of the distal SVC.  There is new right lower lobe atelectasis.  Upper lungs are clear.  IMPRESSION: 1. Endotracheal tube appears in good position. 2. New right lower lobe atelectasis. 3. Interval placement NG tube with side port is just above GE junction.   Electronically Signed   By: Suzy Bouchard M.D.   On: 02/19/2013 00:46   Dg Chest Portable 1 View  02/18/2013   CLINICAL DATA:  Evaluate right central venous line insertion  EXAM: PORTABLE CHEST - 1 VIEW  COMPARISON:  DG CHEST 1V PORT dated 02/18/2013  FINDINGS: There is a right central venous line with tip in the distal SVC. No pneumothorax. Normal cardiac silhouette. There is bibasilar airspace disease greater on the right which is not to be changed from prior. No upper lobe airspace  disease.  IMPRESSION: 1. Interval placement right central venous line without complication. 2. Bibasilar asymmetric airspace disease is not changed.   Electronically Signed   By: Suzy Bouchard M.D.   On: 02/18/2013 21:19   Dg Chest Portable 1 View  02/18/2013   CLINICAL DATA:  Hypoxia, COPD  EXAM: PORTABLE CHEST - 1 VIEW  COMPARISON:  01/27/2013  FINDINGS: Cardiac shadow is stable. Bilateral basilar infiltrates are now seen new from the prior exam. The bony structures are within normal limits.  IMPRESSION: Bibasilar infiltrates.   Electronically Signed   By: Inez Catalina M.D.   On: 02/18/2013 08:28   Dg Abd 2 Views  02/18/2013   CLINICAL DATA:  Bladder tumor, urothelial carcinoma, possible vomiting, post TURBT 02/13/2013  EXAM: ABDOMEN - 2 VIEW  COMPARISON:  None  FINDINGS: Bibasilar infiltrates question pneumonia versus aspiration.  Air-filled nondistended small bowel loops the mid abdomen, question postoperative ileus.  Bubbly gas identified in the pelvis, could be related to stool within the distal colon but extending farther left lateral than is typically seen with rectal vault stool, raising question of extraperitoneal gas in the left pelvis.  Bones unremarkable.  No urinary tract calcification.  No definite free intraperitoneal air.  IMPRESSION: Bibasilar opacities question pneumonia versus aspiration.  Potential postoperative ileus.  Bubbly gas extending farther left laterally in the inferior pelvis than is typically seen with stool, question prominent stool versus extraperitoneal extraluminal gas in patient who has had a recent cystoscopy and TURBT ; CT imaging of the abdomen and pelvis with IV and oral contrast recommended.  Findings called to Dr. Sarajane Jews on 02/18/2013 at 1315 hr.   Electronically Signed   By: Lavonia Dana M.D.   On: 02/18/2013 13:16    Anti-infectives: Anti-infectives   Start     Dose/Rate Route Frequency Ordered Stop   02/19/13 2300  vancomycin (VANCOCIN) IVPB 750 mg/150 ml  premix     750 mg 150 mL/hr over 60 Minutes Intravenous Every 12 hours 02/19/13 1436     02/19/13 1500  meropenem (MERREM) 1 g in sodium chloride 0.9 % 100 mL IVPB     1 g 200 mL/hr over 30 Minutes Intravenous 3 times per day 02/19/13 1436     02/19/13 1000  vancomycin (VANCOCIN) IVPB 750 mg/150 ml premix  Status:  Discontinued     750 mg 150 mL/hr over 60 Minutes Intravenous Every 24 hours 02/18/13 1031 02/19/13 1437   02/18/13 1800  clindamycin (  CLEOCIN) IVPB 900 mg     900 mg 100 mL/hr over 30 Minutes Intravenous 4 times per day 02/18/13 1626     02/18/13 1800  meropenem (MERREM) 500 mg in sodium chloride 0.9 % 50 mL IVPB  Status:  Discontinued     500 mg 100 mL/hr over 30 Minutes Intravenous Every 12 hours 02/18/13 1649 02/19/13 1436   02/18/13 1200  piperacillin-tazobactam (ZOSYN) IVPB 2.25 g  Status:  Discontinued     2.25 g 100 mL/hr over 30 Minutes Intravenous 4 times per day 02/18/13 1026 02/18/13 1626   02/18/13 1100  vancomycin (VANCOCIN) 1,500 mg in sodium chloride 0.9 % 500 mL IVPB     1,500 mg 250 mL/hr over 120 Minutes Intravenous  Once 02/18/13 1026 02/18/13 1300   02/18/13 0800  cefTRIAXone (ROCEPHIN) 1 g in dextrose 5 % 50 mL IVPB     1 g Intravenous  Once 02/18/13 0752 02/18/13 0848       Assessment/Plan 1. POD 2, s/p ex lap with drainage of RTP and preperitoneal soft tissue infection  2. Bladder perforation  3. Sepsis  4. VDRF   Plan: 1. Cont dressing changes to abdominal midline wound 2. Cont with JP and penrose.  Urology will give pyridium at some point to determine if any of this drainage is urine 3. Further care per CCM   LOS: 2 days    Omarion Minnehan E 02/20/2013, 8:13 AM Pager: [phone]

## 2013-02-20 NOTE — Progress Notes (Signed)
Name: Jason [name] MRN: [id number] DOB: 1945/01/19    ADMISSION DATE:  02/18/2013 REFERRING MD : [facility], Dr. Irene Limbo  PRIMARY SERVICE: PCCM   CHIEF COMPLAINT: Sepsis   BRIEF PATIENT DESCRIPTION: 68 yo AAF s/p TURP procedure, presented to [place] ED after pulling out his foley catheter. Initial lab work revealed ARF, UTI, suspected aspiration pneumonia, and sepsis. He was febrile and hypoxic, with labs and symptoms of sepsis. CT scan ab/pelvis discussed w/ evidence of soft tissue infection in the pelvis with extension to the fascia of the rectus muscles and of the left internal obturator muscle consistent with necrotizing fasciitis. No visible pus collections in the pelvis as well as extensive bilateral lower lobe pneumonia. Blood pressures became soft, but pt responded well to 3L IVF.   SIGNIFICANT EVENTS / STUDIES:  2/18 CT abd/pelvis w/o contrast: Evidence consistent with anaerobic soft tissue infection in the pelvis with extension to the fascia of the rectus muscles and of the left internal obturator muscle consistent with necrotizing fasciitis. No visible pus collections in the pelvis. Extensive bilateral lower lobe pneumonia.  2/18 US Renal: No hydronephrosis.  2/18 CXR: Bibasilar opacities question pneumonia versus aspiration. Potential postoperative ileus. Bubbly gas extending farther left laterally in the inferior pelvis than is typically seen with stool, question prominent stool versus extraperitoneal extraluminal gas in patient who has had a recent cystoscopy and TURBT  2/18 Exploratory laparotomy with drainage of retroperitoneal and preperitoneal soft tissue infection 2/19 CXR Endotracheal tube appears in good position. New right lower lobe atelectasis.  Interval placement NG tube with side port is just above GE  Junction. 2/19 Bronchoscopy,Sent purulent fluid   LINES / TUBES:  2/18 Foley Cath>>  2/18 PIV x1>>  2/18 RIJ>>>  2/19 OETT>>  CULTURES:  2/18 blood culture>>  gram neg rods; final pending 2/18 urine culture>>E. Coli, final pending  2/18 sputum culture: neg, final pending  2/19 wound cultures >> gram neg rods 2/19 fungus culture >> negative  2/19 Broch: rare gram positive cocci in pairs final pending   ANTIBIOTICS:  mero 2/18>>>  vanc 2/18>>>  clinda 2/18>>>   SUBJECTIVE: Sedated. Intubated. S/p exp lap with drainage and bronchoscopy. No pressors needed overnight.   VITAL SIGNS: Temp:  [97.5 F (36.4 C)-98.3 F (36.8 C)] 97.7 F (36.5 C) (02/20 0412) Pulse Rate:  [71-100] 72 (02/20 0322) Resp:  [12-28] 16 (02/20 0322) BP: (91-126)/(47-96) 94/48 mmHg (02/19 2345) SpO2:  [96 %-100 %] 100 % (02/20 0322) FiO2 (%):  [40 %-50 %] 40 % (02/20 0322) Weight:  [164 lb 3.9 oz (74.5 kg)] 164 lb 3.9 oz (74.5 kg) (02/20 0412) HEMODYNAMICS: CVP:  [5 mmHg-9 mmHg] 6 mmHg VENTILATOR SETTINGS: Vent Mode:  [-] PRVC FiO2 (%):  [40 %-50 %] 40 % Set Rate:  [16 bmp] 16 bmp Vt Set:  [590 mL] 590 mL PEEP:  [5 cmH20] 5 cmH20 Plateau Pressure:  [9 cmH20-13 cmH20] 13 cmH20 INTAKE / OUTPUT: Intake/Output     02/19 0701 - 02/20 0700 02/20 0701 - 02/21 0700   P.O.     I.V. (mL/kg) 1877.5 (25.2)    Other     NG/GT 159.6    IV Piggyback 550    Total Intake(mL/kg) 2587.1 (34.7)    Urine (mL/kg/hr) 1560 (0.9)    Drains 100 (0.1)    Blood     Total Output 1660     Net +927.1            PHYSICAL EXAMINATION: Gen:  Sedated. Intubated. BP stable. HEENT: AT. [place]. RIJ. OETT.  CV: RRR s1 s2 No m  Chest: CTAB, diminished bilateral basses Abd: Soft, bilateral penrose drains LLL/RLQ . Dressing C/D/I . Vertical Abdominal incision. Ext: trace edema, no erythema, SCD's in place.  Skin: sacral hyperpigmentation and small ~1 cm of skin breakdown. Tender.  Neuro: Intubated. Sedated. No focal deficits. Baseline dementia. Awakens to name and shakes head to answer questions.   LABS:  CBC  Recent Labs Lab 02/18/13 0647  WBC 14.4*  HGB 13.9  HCT 39.9  PLT 141*    Coag's  Recent Labs Lab 02/18/13 2032  APTT 30  INR 1.19   BMET  Recent Labs Lab 02/19/13 1230 02/19/13 2032 02/20/13 0432  NA 145 149* 147  K 4.4 4.7 4.3  CL 113* 116* 114*  CO2 21 21 22   BUN 42* 39* 39*  CREATININE 1.02 1.03 1.02  GLUCOSE 122* 152* 187*   Electrolytes  Recent Labs Lab 02/19/13 0500 02/19/13 1230 02/19/13 2032 02/20/13 0025 02/20/13 0432  CALCIUM 7.5* 7.9* 7.8*  --  8.0*  MG 2.5 2.5  --  2.5  --   PHOS 2.7 2.3  --  2.7  --    Sepsis Markers  Recent Labs Lab 02/18/13 0824 02/18/13 2045  LATICACIDVEN 2.9* 1.4   ABG  Recent Labs Lab 02/19/13 0144 02/19/13 0853  PHART 7.288* 7.397  PCO2ART 39.1 30.8*  PO2ART 73.0* 85.0   Liver Enzymes  Recent Labs Lab 02/19/13 0500  AST 43*  ALT 42  ALKPHOS 105  BILITOT 1.5*  ALBUMIN 1.8*   Cardiac Enzymes No results found for this basename: TROPONINI, PROBNP,  in the last 168 hours Glucose  Recent Labs Lab 02/19/13 0818 02/19/13 1142 02/19/13 1546 02/19/13 1856 02/20/13 0006 02/20/13 0410  GLUCAP 76 110* 124* 127* 183* 182*    Imaging Ct Abdomen Pelvis Wo Contrast  02/18/2013   CLINICAL DATA:  Fever. Recent transurethral resection of bladder tumor. Extraluminal gas collection in the left side of the pelvis.  EXAM: CT ABDOMEN AND PELVIS WITHOUT CONTRAST  TECHNIQUE: Multidetector CT imaging of the abdomen and pelvis was performed following the standard protocol without intravenous contrast.  COMPARISON:  Radiograph dated 02/18/2013 and CT scan dated 08/30/2006  FINDINGS: There are bilateral extensive lower lobe consolidative pulmonary infiltrates. No effusions. Heart size is normal. Tiny hiatal hernia.  There is extensive extraluminal air in the soft tissues of the pelvis including the presacral space and along the left side of the pelvis along the fascia of the left antral operator muscle as well as extending anteriorly along the deep fascia of the rectus muscles. This is consistent  with and anaerobic infection and possible necrotizing fasciitis.  The liver, biliary tree, spleen, pancreas, adrenal glands, and kidneys demonstrate no significant abnormality. The bowel appears normal. Foley catheter is in place in the bladder with a small amount of air in the bladder.  IMPRESSION: 1. Evidence consistent with anaerobic soft tissue infection in the pelvis with extension to the fascia of the rectus muscles and of the left internal obturator muscle consistent with necrotizing fasciitis. No visible pus collections in the pelvis. 2. Extensive bilateral lower lobe pneumonia.  Critical Value/emergent results were called by telephone at the time of interpretation on 02/18/2013 at 3:55 PM to Dr. Murray Hodgkins , who verbally acknowledged these results.   Electronically Signed   By: Rozetta Nunnery M.D.   On: 02/18/2013 15:56   US Renal  02/18/2013   CLINICAL  DATA:  Acute renal failure  EXAM: RENAL/URINARY TRACT ULTRASOUND COMPLETE  COMPARISON:  01/13/2013  FINDINGS: Right Kidney:  Length: 11.2 cm.  No mass or hydronephrosis.  Left Kidney:  Length: 11.0 cm.  No mass or hydronephrosis.  Bladder:  Poorly visualized/decompressed by indwelling Foley catheter.  IMPRESSION: No hydronephrosis.  Bladder poorly visualized/decompressed by indwelling Foley catheter.   Electronically Signed   By: Julian Hy M.D.   On: 02/18/2013 13:36   Dg Chest Port 1 View  02/19/2013   CLINICAL DATA:  Repositioning of endotracheal tube.  EXAM: PORTABLE CHEST - 1 VIEW  COMPARISON:  DG CHEST 1V PORT dated 02/19/2013  FINDINGS: The endotracheal tube tip now lies 5.7 cm above the crotch of the carina. The lungs are well-expanded. Increased density at the right lung base persists and is consistent with atelectasis or pneumonia. The cardiac silhouette is not enlarged. The pulmonary vascularity is not engorged. The esophagogastric tube tip and proximal port project below the inferior margin of the image. The right internal jugular  venous catheter tip lies in the region of the midportion of the SVC.  IMPRESSION: There has been adequate interval repositioning of the endotracheal tube. The remainder of the examination is unchanged.   Electronically Signed   By: David  Martinique   On: 02/19/2013 10:26   Dg Chest Port 1 View  02/19/2013   CLINICAL DATA:  Post bronchoscopy  EXAM: PORTABLE CHEST - 1 VIEW  COMPARISON:  Portable exam 0952 hr compared to the 0026 hr  FINDINGS: Tip of endotracheal tube 11 cm above carina.  Right jugular central venous catheter tip projects over SVC.  Normal heart size and mediastinal contours.  Bibasilar opacities question infiltrate versus atelectasis greater on right.  Upper lobes clear.  No pleural effusion or pneumothorax.  IMPRESSION: Bibasilar opacities greater on right question atelectasis versus infiltrate.  High endotracheal tube position 11 cm above carina, consider advancing tube 5 cm for more stable positioning within the thoracic trachea.  Findings called to [facility] on [facility] [place] on 02/19/2013 at 1004 hr.   Electronically Signed   By: Lavonia Dana M.D.   On: 02/19/2013 10:04   Dg Chest Port 1 View  02/19/2013   CLINICAL DATA:  Endotracheal tube placement  EXAM: PORTABLE CHEST - 1 VIEW  COMPARISON:  DG CHEST 1V PORT dated 02/18/2013  FINDINGS: Endotracheal tube is positioned 6 cm from the carina. NG to extends into the stomach. Side port is just above the GE junction. Right central venous line placement of the distal SVC.  There is new right lower lobe atelectasis.  Upper lungs are clear.  IMPRESSION: 1. Endotracheal tube appears in good position. 2. New right lower lobe atelectasis. 3. Interval placement NG tube with side port is just above GE junction.   Electronically Signed   By: Suzy Bouchard M.D.   On: 02/19/2013 00:46   Dg Chest Portable 1 View  02/18/2013   CLINICAL DATA:  Evaluate right central venous line insertion  EXAM: PORTABLE CHEST - 1 VIEW  COMPARISON:  DG CHEST 1V PORT dated 02/18/2013   FINDINGS: There is a right central venous line with tip in the distal SVC. No pneumothorax. Normal cardiac silhouette. There is bibasilar airspace disease greater on the right which is not to be changed from prior. No upper lobe airspace disease.  IMPRESSION: 1. Interval placement right central venous line without complication. 2. Bibasilar asymmetric airspace disease is not changed.   Electronically Signed   By: Nicole Kindred  Leonia Reeves M.D.   On: 02/18/2013 21:19   Dg Chest Portable 1 View  02/18/2013   CLINICAL DATA:  Hypoxia, COPD  EXAM: PORTABLE CHEST - 1 VIEW  COMPARISON:  01/27/2013  FINDINGS: Cardiac shadow is stable. Bilateral basilar infiltrates are now seen new from the prior exam. The bony structures are within normal limits.  IMPRESSION: Bibasilar infiltrates.   Electronically Signed   By: Inez Catalina M.D.   On: 02/18/2013 08:28   Dg Abd 2 Views  02/18/2013   CLINICAL DATA:  Bladder tumor, urothelial carcinoma, possible vomiting, post TURBT 02/13/2013  EXAM: ABDOMEN - 2 VIEW  COMPARISON:  None  FINDINGS: Bibasilar infiltrates question pneumonia versus aspiration.  Air-filled nondistended small bowel loops the mid abdomen, question postoperative ileus.  Bubbly gas identified in the pelvis, could be related to stool within the distal colon but extending farther left lateral than is typically seen with rectal vault stool, raising question of extraperitoneal gas in the left pelvis.  Bones unremarkable.  No urinary tract calcification.  No definite free intraperitoneal air.  IMPRESSION: Bibasilar opacities question pneumonia versus aspiration.  Potential postoperative ileus.  Bubbly gas extending farther left laterally in the inferior pelvis than is typically seen with stool, question prominent stool versus extraperitoneal extraluminal gas in patient who has had a recent cystoscopy and TURBT ; CT imaging of the abdomen and pelvis with IV and oral contrast recommended.  Findings called to Dr. Sarajane Jews on  02/18/2013 at 1315 hr.   Electronically Signed   By: Lavonia Dana M.D.   On: 02/18/2013 13:16   CXR: much improved rt base aeration, ett wnl  ASSESSMENT / PLAN:  PULMONARY  A: PNA, r/o aspiration vs HCAP      Intubated s/p OR  Rt base rll collapse?     Bronchoscopy  P:  S/p Bronchoscopy, Sent purulent fluid, major improved pcxr findings chest pt continued x 24 hrs then dc CXR in am  con't mucomyst another 24 hours Wean cpap5 ps 5, goal 1 hr, assess rsbi, appears well  CARDIOVASCULAR  A: Severe sepsis, likely septic shock      Borderline AI (~20) P:  Lactic acid Nomal Has not required pressors Stress dose steroids, reduce CVP 6 no pressors, no further bolus  RENAL  A: ARF, ATN likely frmo septic shock -resolving? hypernatremia P:  Lactic acid repeat NL Cr improving 3.65 >> 1.41>> normal  UOP: 1.6L/24h (+927 yesterday) Add d5w kvo saline  GASTROINTESTINAL  A: NPO, necrotizing pelvic infection, see ID      S/p Exploratory laparotomy with drainage of retroperitoneal and preperitoneal soft tissue infection P:  ppi  TF on hold for weaning  HEMATOLOGIC  A: dvt prevention  P:  scd  sub q heparin   INFECTIOUS  A: Necrotizing pelvic infection s/p bladder surgery, likely related to bladder as source, HCAP vs aspiration  S/p Exploratory laparotomy with drainage of retroperitoneal and preperitoneal soft tissue infection P:  Drainage ~ 100 ml  clinda for toxin inhibition with borderline bP now improved, dc  Mero maintain and follow gram neg  vanc dc Blood: gram neg rods; final pending urine: E.coli, final pending  wound culture: gram neg rods; final pending  Sputum: neg, final pending  Purlent fluid via broch sent 2/19 rare gram positive cocci in pairs final pending  Wound care sacrum; Sacral foam for protection with Q shiftmonitoring for evolution of wound.   Surgery and urology following Still favor polymicrobial treatment , regardless off gram neg  isolate  ENDOCRINE  A: R/o rel AI  P:  Cortisol 21.4 Reduce steroids ssi   NEUROLOGIC  A: Dementia, pain, vent dyschrony P:  fent drip No benzo  wua upright  TODAY'S SUMMARY:doing well, weaning, dc vanc, clinda   02/20/2013, 7:05 AM  Ccm time 30 min  I have fully examined this patient and agree with above findings.    And edited in fill  Lavon Paganini. Titus Mould, MD, [place] Pgr: [facility]

## 2013-02-20 NOTE — Procedures (Signed)
Extubation Procedure Note  Patient Details:   Name: Jason [name] DOB: 01/13/1945 MRN: [id number]   Airway Documentation:     Evaluation  O2 sats: stable throughout Complications: No apparent complications Patient did tolerate procedure well. Bilateral Breath Sounds: Clear Suctioning: Airway Yes  Hope Pigeon, MA 02/20/2013, 9:58 AM

## 2013-02-20 NOTE — Clinical Social Work Note (Signed)
CSW spoke w CSW Lonia Skinner, patient no longer at [facility].  Gave information and verbal hand off to [place].  CSW Pearsall signing off at this time.  Edwyna Shell, LCSW Clinical Social Worker [phone])

## 2013-02-20 NOTE — Progress Notes (Signed)
Extubated. NAD Soft, mild distension. Open midline wound intact, no enteric content drainage. Serous & serosang drainage around penroses. JP - serosang.   Can have clears  Cont current wound care F/u cx from OR - so far GNR Cont abx ?ongoing bladder leak given nature of drainage around wound  Leighton Ruff. Redmond Pulling, MD, FACS General, Bariatric, & Minimally Invasive Surgery [facility], [place]

## 2013-02-20 NOTE — Progress Notes (Signed)
Good progress Spoke with staff Urine output good Urine clear E coli in urine with c/s pending Vitals OK Abdomen soft with no cellulits; genitalia normal Serum Cr improved ? Possible urine in modestly wet dressings JP drain 100 cc's Plan: continue with foley drainage and supportive care Eventually will give pt some po pyridium to assess for urine in dressing/JP drain or send fluid for Cr  Will try to contact family

## 2013-02-20 NOTE — Progress Notes (Signed)
[facility]  Patient Status: Active (receiving services up to time of hospitalization)  [facility] is providing the following services: RN  If patient discharges after hours, please call [phone].   Jason [name] 02/20/2013, 7:29 PM

## 2013-02-21 ENCOUNTER — Inpatient Hospital Stay (HOSPITAL_COMMUNITY): Payer: Medicare (Managed Care)

## 2013-02-21 LAB — CBC
HCT: 31.8 % — ABNORMAL LOW (ref 39.0–52.0)
HEMATOCRIT: 32.8 % — AB (ref 39.0–52.0)
HEMOGLOBIN: 11.4 g/dL — AB (ref 13.0–17.0)
HEMOGLOBIN: 10.9 g/dL — AB (ref 13.0–17.0)
MCH: 24.1 pg — ABNORMAL LOW (ref 26.0–34.0)
MCH: 24.1 pg — AB (ref 26.0–34.0)
MCHC: 34.8 g/dL (ref 30.0–36.0)
MCHC: 34.3 g/dL (ref 30.0–36.0)
MCV: 69.2 fL — AB (ref 78.0–100.0)
MCV: 70.2 fL — ABNORMAL LOW (ref 78.0–100.0)
PLATELETS: 238 10*3/uL (ref 150–400)
Platelets: 227 10*3/uL (ref 150–400)
RBC: 4.74 MIL/uL (ref 4.22–5.81)
RBC: 4.53 MIL/uL (ref 4.22–5.81)
RDW: 13.5 % (ref 11.5–15.5)
RDW: 13.9 % (ref 11.5–15.5)
WBC: 29.2 10*3/uL — ABNORMAL HIGH (ref 4.0–10.5)
WBC: 26.7 10*3/uL — ABNORMAL HIGH (ref 4.0–10.5)

## 2013-02-21 LAB — GLUCOSE, CAPILLARY
GLUCOSE-CAPILLARY: 136 mg/dL — AB (ref 70–99)
GLUCOSE-CAPILLARY: 131 mg/dL — AB (ref 70–99)
GLUCOSE-CAPILLARY: 104 mg/dL — AB (ref 70–99)
GLUCOSE-CAPILLARY: 126 mg/dL — AB (ref 70–99)
Glucose-Capillary: 137 mg/dL — ABNORMAL HIGH (ref 70–99)
Glucose-Capillary: 143 mg/dL — ABNORMAL HIGH (ref 70–99)

## 2013-02-21 LAB — BASIC METABOLIC PANEL
BUN: 40 mg/dL — ABNORMAL HIGH (ref 6–23)
CO2: 24 meq/L (ref 19–32)
CREATININE: 1.09 mg/dL (ref 0.50–1.35)
Calcium: 8 mg/dL — ABNORMAL LOW (ref 8.4–10.5)
Chloride: 115 mEq/L — ABNORMAL HIGH (ref 96–112)
GFR calc Af Amer: 78 mL/min — ABNORMAL LOW (ref >90)
GFR calc non Af Amer: 67 mL/min — ABNORMAL LOW (ref >90)
GLUCOSE: 136 mg/dL — AB (ref 70–99)
Potassium: 3.9 mEq/L (ref 3.7–5.3)
Sodium: 150 mEq/L — ABNORMAL HIGH (ref 137–147)

## 2013-02-21 LAB — CULTURE, RESPIRATORY

## 2013-02-21 LAB — CULTURE, RESPIRATORY W GRAM STAIN: Gram Stain: NONE SEEN

## 2013-02-21 LAB — CULTURE, ROUTINE-ABSCESS

## 2013-02-21 MED ORDER — WHITE PETROLATUM GEL
Status: AC
  Administered 2013-02-21: 0.2
  Filled 2013-02-21: qty 5

## 2013-02-21 MED ORDER — HYDROCORTISONE NA SUCCINATE PF 100 MG IJ SOLR
25.0000 mg | Freq: Two times a day (BID) | INTRAMUSCULAR | Status: DC
  Administered 2013-02-21 – 2013-02-27 (×12): 25 mg via INTRAVENOUS
  Filled 2013-02-21 (×16): qty 0.5

## 2013-02-21 MED ORDER — HYDROCORTISONE NA SUCCINATE PF 100 MG IJ SOLR
25.0000 mg | Freq: Two times a day (BID) | INTRAMUSCULAR | Status: DC
  Filled 2013-02-21 (×2): qty 0.5

## 2013-02-21 NOTE — Plan of Care (Addendum)
Called by RN to notify of event noted with RT.  Pt found with his O2 off and O2 sats in 70s.  Placed on NRB, breathing treatment and sating low 90s on 50% VM currently.    CXR ordered and personally reviewed:  No significant change from this morning  Called RN a second time after reviewing CXR, pt now with O2 sats ~ 99% on VM 50%.  Advised to wean O2 for goal sats > 92% and to call if any change in clinical status.  Jason [name] 02/21/13 11:23 PM

## 2013-02-21 NOTE — Significant Event (Signed)
Rapid Response Event Note  Overview: Time Called: 2236 Arrival Time: 2240 Event Type: Respiratory  Initial Focused Assessment: Called by bedside RN regarding a drop in oxygen saturations. RT had come to bedside for scheduled treatment and found that patient had taken his oxygen off. RT placed a NRB on patient for oxygen saturations in the 70s. Upon my arrival to room, patient alert, rhonchi noted in all lobes, productive cough sats 96%. HR and BP stable. Patient denies shortness of breath or difficulty breathing.   Interventions: Albuterol treatment given. Patient weaned to 50% venti mask, sats 96%, encouraged cough and deep breathing. Chest xray ordered per protocol. MD notified. Will continue to monitor, advised bedside RN to call with further needs.  Event Summary: Name of Physician Notified: Dr. Philbert Riser at 2250    at    Outcome: Stayed in room and stabalized  Event End Time: [place], Romona Curls

## 2013-02-21 NOTE — Evaluation (Signed)
Clinical/Bedside Swallow Evaluation Patient Details  Name: Jason [name] MRN: [id number] Date of Birth: March 22, 1945  Today's Date: 02/21/2013 Time: 4332-9518 SLP Time Calculation (min): 30 min  Past Medical History:  Past Medical History  Diagnosis Date  . Hypertension   . Stroke     Dementia, otherwise, no residual  . Bladder mass 02-10-13    surgery planned for this  . 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 COPD (chronic obstructive pulmonary disease)     previous history and current smoking  . 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 Dementia 02/18/2013   Past Surgical History:  Past Surgical History  Procedure Laterality Date  . Transurethral resection of bladder tumor with gyrus (turbt-gyrus) N/A 02/13/2013    Procedure: TRANSURETHRAL RESECTION OF BLADDER TUMOR WITH GYRUS (TURBT-GYRUS), bladder biopsies;  Surgeon: Ardis Hughs, MD;  Location: [facility];  Service: Urology;  Laterality: N/A;  . Cystoscopy/retrograde/ureteroscopy Bilateral 02/13/2013    Procedure: BILATERAL RETROGRADE;  Surgeon: Ardis Hughs, MD;  Location: [facility];  Service: Urology;  Laterality: Bilateral;  . Laparotomy N/A 02/18/2013    Procedure: EXPLORATORY LAPAROTOMY drainage of retroperitoneal abscess, drainage of Pre-Peritoneal abscess and Exploration of bladder perforation;  Surgeon: Imogene Burn. Georgette Dover, MD;  Location: [facility] OR;  Service: General;  Laterality: N/A;   HPI:  68 yo AAF s/p TURP procedure, presented to [place] ED after pulling out his foley catheter. Initial lab work revealed ARF, UTI, suspected aspiration pneumonia, and sepsis. He was febrile and hypoxic, with labs and symptoms of sepsis.  CT scan:  extensive bilateral lower lobe pneumonia.  Intubated 02/18/13 to 02/20/13. Bronchoscopy 2/19.   Assessment / Plan / Recommendation Clinical Impression  BSE completed.  Pharyngeal phase dysphagia indicated by weakness and decreased sensory.  Mulitiple swallows required per sip  with thin water by cup indicating pharyngeal weakness.  Oral phase affected by lack of dentition.   Recommend to initiate dysphagia 3 (mechanical soft) and Nectar thick liquids.  Recommend full supervision with all meals secondary to noted cognitive deficits requiring cues and assist to comply with aspiration precautions.  No outward clinical s/s of aspiration noted with thin liquid trials but due to recent intubation, results of CXR,  and current weakness recommend modified liquids to increase safety.  ST to f/u on 02/22/13 for diet tolerance.      Aspiration Risk  Moderate    Diet Recommendation Dysphagia 3 (Mechanical Soft);Nectar-thick liquid   Liquid Administration via: Cup;No straw Medication Administration: Crushed with puree Supervision: Staff to assist with self feeding;Full supervision/cueing for compensatory strategies Compensations: Slow rate;Small sips/bites Postural Changes and/or Swallow Maneuvers: Seated upright 90 degrees;Upright 30-60 min after meal    Other  Recommendations Oral Care Recommendations: Oral care Q4 per protocol Other Recommendations: Have oral suction available;Order thickener from pharmacy;Prohibited food (jello, ice cream, thin soups);Remove water pitcher;Clarify dietary restrictions   Follow Up Recommendations  Skilled Nursing facility;24 hour supervision/assistance    Frequency and Duration min 2x/week  2 weeks       SLP Swallow Goals Please refer to Care Plans for goals   Swallow Study Prior Functional Status   No prior reports of dysphagia     General Date of Onset: 02/18/13 HPI: 68 yo AAF s/p TURP procedure, presented to [place] ED after pulling out his foley catheter. Initial lab work revealed ARF, UTI, suspected aspiration pneumonia, and sepsis. He was febrile and hypoxic, with labs and symptoms of sepsis. CT scan ab/pelvis discussed w/ evidence  of soft tissue infection in the pelvis with extension to the fascia of the rectus muscles and of  the left internal obturator muscle consistent with necrotizing fasciitis. No visible pus collections in the pelvis as well as extensive bilateral lower lobe pneumonia Type of Study: Bedside swallow evaluation Diet Prior to this Study: NPO Respiratory Status: Nasal cannula History of Recent Intubation: Yes Length of Intubations (days): 2 days Date extubated: 02/20/13 Behavior/Cognition: Alert;Cooperative;Confused;Requires cueing Oral Cavity - Dentition: Poor condition;Missing dentition Self-Feeding Abilities: Needs assist;Able to feed self with adaptive devices Patient Positioning: Upright in bed Baseline Vocal Quality: Hoarse Volitional Cough: Cognitively unable to elicit Volitional Swallow: Unable to elicit    Oral/Motor/Sensory Function Overall Oral Motor/Sensory Function: Impaired Labial ROM: Within Functional Limits Labial Symmetry: Within Functional Limits Labial Strength: Reduced Labial Sensation: Reduced Lingual ROM: Reduced left Lingual Symmetry: Abnormal symmetry left Lingual Strength: Reduced Lingual Sensation: Reduced Facial ROM: Within Functional Limits Facial Symmetry: Within Functional Limits Facial Strength: Within Functional Limits Facial Sensation: Reduced Velum: Within Functional Limits Mandible: Within Functional Limits   Ice Chips Ice chips: Impaired Pharyngeal Phase Impairments: Suspected delayed Swallow;Decreased hyoid-laryngeal movement   Thin Liquid Thin Liquid: Impaired Presentation: Cup;Spoon Pharyngeal  Phase Impairments: Suspected delayed Swallow;Multiple swallows    Nectar Thick Nectar Thick Liquid: Impaired Presentation: Cup Pharyngeal Phase Impairments: Suspected delayed Swallow;Decreased hyoid-laryngeal movement   Honey Thick Honey Thick Liquid: Not tested   Puree Puree: Impaired Oral Phase Functional Implications: Prolonged oral transit Pharyngeal Phase Impairments: Suspected delayed Swallow;Decreased hyoid-laryngeal movement    Solid   GO    Solid: Impaired Oral Phase Impairments: Poor awareness of bolus;Impaired mastication Pharyngeal Phase Impairments: Suspected delayed Swallow;Decreased hyoid-laryngeal movement      Sharman Crate [place], [place] [facility] [place] 02/21/2013,12:50 PM

## 2013-02-21 NOTE — Progress Notes (Signed)
3 Days Post-Op  Subjective: PT AWAKE AND ALERT. NO COMPLAINTS  Objective: Vital signs in last 24 hours: Temp:  [97 F (36.1 C)-98.3 F (36.8 C)] 98.3 F (36.8 C) (02/21 0740) Pulse Rate:  [56-78] 59 (02/21 0900) Resp:  [15-30] 19 (02/21 0700) BP: (108-146)/(46-75) 135/53 mmHg (02/21 0800) SpO2:  [90 %-99 %] 95 % (02/21 0900) Last BM Date: 02/18/13  Intake/Output from previous day: 02/20 0701 - 02/21 0700 In: 1479.7 [I.V.:1079.7; NG/GT:100; IV Piggyback:300] Out: 1324 [Urine:1730; Drains:80] Intake/Output this shift: Total I/O In: -  Out: 395 [Urine:330; Drains:65]  Incision/Wound:OPEN AND CLEAN DRAINS APPEAR SEROUS.   Lab Results:   Recent Labs  02/21/13 0312  WBC 29.2*  HGB 11.4*  HCT 32.8*  PLT 227   BMET  Recent Labs  02/20/13 0432 02/21/13 0312  NA 147 150*  K 4.3 3.9  CL 114* 115*  CO2 22 24  GLUCOSE 187* 136*  BUN 39* 40*  CREATININE 1.02 1.09  CALCIUM 8.0* 8.0*   PT/INR  Recent Labs  02/18/13 2032  LABPROT 14.8  INR 1.19   ABG  Recent Labs  02/19/13 0144 02/19/13 0853  PHART 7.288* 7.397  HCO3 18.7* 19.1*    Studies/Results: Dg Chest Port 1 View  02/21/2013   CLINICAL DATA:  Pneumonia, extubated  EXAM: PORTABLE CHEST - 1 VIEW  COMPARISON:  02/20/2013  FINDINGS: Interval extubation. Right IJ central line and NG tube have also been removed. No pneumothorax. Slight worsening basilar airspace disease and left lower lobe consolidation/ collapse. Small effusions not excluded. Stable upper lobe aeration.  IMPRESSION: Interval extubation.  Slight worsening basilar airspace process and left lower lobe collapse/consolidation, compatible with pneumonia.   Electronically Signed   By: Daryll Brod M.D.   On: 02/21/2013 07:42   Dg Chest Port 1 View  02/20/2013   CLINICAL DATA:  68 year old male intubated. Sepsis. Initial encounter.  EXAM: PORTABLE CHEST - 1 VIEW  COMPARISON:  02/1913 and earlier.  FINDINGS: Portable AP semi upright view at 0527 hr.  Endotracheal tube tip projects below the clavicles now all, about 3 cm above the carina. Enteric tube courses to the left upper quadrant, tip not included. Stable right IJ central line.  Improved bibasilar ventilation with better definition of the diaphragm. Residual patchy and reticular nodular medial lung base density greater on the right. No pneumothorax. No edema. No definite effusion. Stable cardiac size and mediastinal contours.  IMPRESSION: 1. Lines and tubes appear appropriately placed as above. 2. Improved ventilation. Residual medial lung base opacity greater on the right.   Electronically Signed   By: Lars Pinks M.D.   On: 02/20/2013 07:26   Dg Chest Port 1 View  02/19/2013   CLINICAL DATA:  Repositioning of endotracheal tube.  EXAM: PORTABLE CHEST - 1 VIEW  COMPARISON:  DG CHEST 1V PORT dated 02/19/2013  FINDINGS: The endotracheal tube tip now lies 5.7 cm above the crotch of the carina. The lungs are well-expanded. Increased density at the right lung base persists and is consistent with atelectasis or pneumonia. The cardiac silhouette is not enlarged. The pulmonary vascularity is not engorged. The esophagogastric tube tip and proximal port project below the inferior margin of the image. The right internal jugular venous catheter tip lies in the region of the midportion of the SVC.  IMPRESSION: There has been adequate interval repositioning of the endotracheal tube. The remainder of the examination is unchanged.   Electronically Signed   By: David  Martinique   On: 02/19/2013  10:26    Anti-infectives: Anti-infectives   Start     Dose/Rate Route Frequency Ordered Stop   02/19/13 2300  vancomycin (VANCOCIN) IVPB 750 mg/150 ml premix  Status:  Discontinued     750 mg 150 mL/hr over 60 Minutes Intravenous Every 12 hours 02/19/13 1436 02/20/13 0943   02/19/13 1500  meropenem (MERREM) 1 g in sodium chloride 0.9 % 100 mL IVPB     1 g 200 mL/hr over 30 Minutes Intravenous 3 times per day 02/19/13 1436      02/19/13 1000  vancomycin (VANCOCIN) IVPB 750 mg/150 ml premix  Status:  Discontinued     750 mg 150 mL/hr over 60 Minutes Intravenous Every 24 hours 02/18/13 1031 02/19/13 1437   02/18/13 1800  clindamycin (CLEOCIN) IVPB 900 mg  Status:  Discontinued     900 mg 100 mL/hr over 30 Minutes Intravenous 4 times per day 02/18/13 1626 02/20/13 0943   02/18/13 1800  meropenem (MERREM) 500 mg in sodium chloride 0.9 % 50 mL IVPB  Status:  Discontinued     500 mg 100 mL/hr over 30 Minutes Intravenous Every 12 hours 02/18/13 1649 02/19/13 1436   02/18/13 1200  piperacillin-tazobactam (ZOSYN) IVPB 2.25 g  Status:  Discontinued     2.25 g 100 mL/hr over 30 Minutes Intravenous 4 times per day 02/18/13 1026 02/18/13 1626   02/18/13 1100  vancomycin (VANCOCIN) 1,500 mg in sodium chloride 0.9 % 500 mL IVPB     1,500 mg 250 mL/hr over 120 Minutes Intravenous  Once 02/18/13 1026 02/18/13 1300   02/18/13 0800  cefTRIAXone (ROCEPHIN) 1 g in dextrose 5 % 50 mL IVPB     1 g Intravenous  Once 02/18/13 0752 02/18/13 0848      Assessment/Plan: s/p Procedure(s): EXPLORATORY LAPAROTOMY drainage of retroperitoneal abscess, drainage of Pre-Peritoneal abscess and Exploration of bladder perforation (N/A) WOUNDS OK.  DRAINAGE LOOKS SEROUS. CAN ADVANCE DIET AS TOLERATED.   LOS: 3 days    Kaliah Haddaway A. 02/21/2013

## 2013-02-21 NOTE — Progress Notes (Signed)
Name: Jason [name] MRN: [id number] DOB: 01/22/1945    ADMISSION DATE:  02/18/2013 REFERRING MD : [facility], Dr. Irene Limbo  PRIMARY SERVICE: PCCM   CHIEF COMPLAINT: Sepsis   BRIEF PATIENT DESCRIPTION: 68 yo AAF s/p TURP procedure, presented to [place] ED after pulling out his foley catheter. Initial lab work revealed ARF, UTI, suspected aspiration pneumonia, and sepsis. He was febrile and hypoxic, with labs and symptoms of sepsis. CT scan ab/pelvis discussed w/ evidence of soft tissue infection in the pelvis with extension to the fascia of the rectus muscles and of the left internal obturator muscle consistent with necrotizing fasciitis. No visible pus collections in the pelvis as well as extensive bilateral lower lobe pneumonia. Blood pressures became soft, but pt responded well to 3L IVF.   SIGNIFICANT EVENTS / STUDIES:  2/18 CT abd/pelvis w/o contrast: Evidence consistent with anaerobic soft tissue infection in the pelvis with extension to the fascia of the rectus muscles and of the left internal obturator muscle consistent with necrotizing fasciitis. No visible pus collections in the pelvis. Extensive bilateral lower lobe pneumonia.  2/18 US Renal: No hydronephrosis.  2/18 CXR: Bibasilar opacities question pneumonia versus aspiration. Potential postoperative ileus. Bubbly gas extending farther left laterally in the inferior pelvis than is typically seen with stool, question prominent stool versus extraperitoneal extraluminal gas in patient who has had a recent cystoscopy and TURBT  2/18 Exploratory laparotomy with drainage of retroperitoneal and preperitoneal soft tissue infection 2/19 CXR Endotracheal tube appears in good position. New right lower lobe atelectasis.  Interval placement NG tube with side port is just above GE  Junction. 2/19 Bronchoscopy,Sent purulent fluid 2/20 extubated   LINES / TUBES:  2/18 Foley Cath>>  2/18 PIV x1>>  2/18 RIJ>>>2/20 2/19 OETT>>2/20  CULTURES:   2/18 blood culture>> gram neg rods>>> 2/18 urine culture>>E. Coli>>>  2/18 sputum culture: neg, final pending  2/19 wound cultures >> gram neg rods>>>E coli (pansens) 2/19 fungus culture >> negative  2/19 Broch: rare gram positive cocci in pairs final pending   ANTIBIOTICS:  mero 2/18>>>  vanc 2/18>>>2/20 clinda 2/18>>> 2/20  SUBJECTIVE: extubated well  VITAL SIGNS: Temp:  [97 F (36.1 C)-98.2 F (36.8 C)] 98.1 F (36.7 C) (02/21 0412) Pulse Rate:  [59-84] 59 (02/20 1900) Resp:  [15-23] 15 (02/20 1900) BP: (96-120)/(49-66) 113/57 mmHg (02/20 1900) SpO2:  [95 %-100 %] 96 % (02/20 1900) FiO2 (%):  [30 %-40 %] 30 % (02/20 0923) HEMODYNAMICS: CVP:  [7 mmHg] 7 mmHg VENTILATOR SETTINGS: Vent Mode:  [-] CPAP FiO2 (%):  [30 %-40 %] 30 % PEEP:  [5 cmH20] 5 cmH20 Pressure Support:  [5 cmH20] 5 cmH20 INTAKE / OUTPUT: Intake/Output     02/20 0701 - 02/21 0700   I.V. (mL/kg) 1049.7 (14.1)   NG/GT 100   IV Piggyback 100   Total Intake(mL/kg) 1249.7 (16.8)   Urine (mL/kg/hr) 1580 (0.9)   Drains 80 (0)   Total Output 1660   Net -410.3         PHYSICAL EXAMINATION: Gen: extubated, no distress HEENT: AT. [place].  CV: RRR s1 s2 No m  Chest: mild coarse rt Abd: Soft, nontender,  Dressing C/D/I . Vertical Abdominal incision. Ext: trace edema, no erythema, SCD's in place.  Skin: sacral hyperpigmentation and small ~1 cm of skin breakdown. Tender.  Neuro: No focal deficits. Baseline dementia. Awakens to name and answers question  appropriately.  LABS:  CBC  Recent Labs Lab 02/18/13 0647 02/21/13 0312  WBC  14.4* 29.2*  HGB 13.9 11.4*  HCT 39.9 32.8*  PLT 141* 227   Coag's  Recent Labs Lab 02/18/13 2032  APTT 30  INR 1.19   BMET  Recent Labs Lab 02/19/13 2032 02/20/13 0432 02/21/13 0312  NA 149* 147 150*  K 4.7 4.3 3.9  CL 116* 114* 115*  CO2 21 22 24   BUN 39* 39* 40*  CREATININE 1.03 1.02 1.09  GLUCOSE 152* 187* 136*   Electrolytes  Recent Labs Lab  02/19/13 0500 02/19/13 1230 02/19/13 2032 02/20/13 0025 02/20/13 0432 02/21/13 0312  CALCIUM 7.5* 7.9* 7.8*  --  8.0* 8.0*  MG 2.5 2.5  --  2.5  --   --   PHOS 2.7 2.3  --  2.7  --   --    Sepsis Markers  Recent Labs Lab 02/18/13 0824 02/18/13 2045  LATICACIDVEN 2.9* 1.4   ABG  Recent Labs Lab 02/19/13 0144 02/19/13 0853  PHART 7.288* 7.397  PCO2ART 39.1 30.8*  PO2ART 73.0* 85.0   Liver Enzymes  Recent Labs Lab 02/19/13 0500  AST 43*  ALT 42  ALKPHOS 105  BILITOT 1.5*  ALBUMIN 1.8*   Cardiac Enzymes No results found for this basename: TROPONINI, PROBNP,  in the last 168 hours Glucose  Recent Labs Lab 02/20/13 0807 02/20/13 1143 02/20/13 1543 02/20/13 1857 02/21/13 0017 02/21/13 0409  GLUCAP 177* 159* 134* 129* 143* 137*    Imaging Dg Chest Port 1 View  02/20/2013   CLINICAL DATA:  68 year old male intubated. Sepsis. Initial encounter.  EXAM: PORTABLE CHEST - 1 VIEW  COMPARISON:  02/1913 and earlier.  FINDINGS: Portable AP semi upright view at 0527 hr. Endotracheal tube tip projects below the clavicles now all, about 3 cm above the carina. Enteric tube courses to the left upper quadrant, tip not included. Stable right IJ central line.  Improved bibasilar ventilation with better definition of the diaphragm. Residual patchy and reticular nodular medial lung base density greater on the right. No pneumothorax. No edema. No definite effusion. Stable cardiac size and mediastinal contours.  IMPRESSION: 1. Lines and tubes appear appropriately placed as above. 2. Improved ventilation. Residual medial lung base opacity greater on the right.   Electronically Signed   By: Lars Pinks M.D.   On: 02/20/2013 07:26   Dg Chest Port 1 View  02/19/2013   CLINICAL DATA:  Repositioning of endotracheal tube.  EXAM: PORTABLE CHEST - 1 VIEW  COMPARISON:  DG CHEST 1V PORT dated 02/19/2013  FINDINGS: The endotracheal tube tip now lies 5.7 cm above the crotch of the carina. The lungs are  well-expanded. Increased density at the right lung base persists and is consistent with atelectasis or pneumonia. The cardiac silhouette is not enlarged. The pulmonary vascularity is not engorged. The esophagogastric tube tip and proximal port project below the inferior margin of the image. The right internal jugular venous catheter tip lies in the region of the midportion of the SVC.  IMPRESSION: There has been adequate interval repositioning of the endotracheal tube. The remainder of the examination is unchanged.   Electronically Signed   By: David  Martinique   On: 02/19/2013 10:26   Dg Chest Port 1 View  02/19/2013   CLINICAL DATA:  Post bronchoscopy  EXAM: PORTABLE CHEST - 1 VIEW  COMPARISON:  Portable exam 0952 hr compared to the 0026 hr  FINDINGS: Tip of endotracheal tube 11 cm above carina.  Right jugular central venous catheter tip projects over SVC.  Normal heart size  and mediastinal contours.  Bibasilar opacities question infiltrate versus atelectasis greater on right.  Upper lobes clear.  No pleural effusion or pneumothorax.  IMPRESSION: Bibasilar opacities greater on right question atelectasis versus infiltrate.  High endotracheal tube position 11 cm above carina, consider advancing tube 5 cm for more stable positioning within the thoracic trachea.  Findings called to [facility] on [address] [facility] on 02/19/2013 at 1004 hr.   Electronically Signed   By: Lavonia Dana M.D.   On: 02/19/2013 10:04   CXR: rt base remains open  ASSESSMENT / PLAN:  PULMONARY  A: PNA, r/o aspiration vs HCAP      Intubated s/p OR  Rt base rll collapse?     Bronchoscopy  P:  S/p Bronchoscopy, Sent purulent fluid, major improved pcxr findings chest pt dc'd mucomyst dc'd Extubated yesterday, doing well CXR in am  Is important mobilize  CARDIOVASCULAR  A: Severe sepsis, likely septic shock      Borderline AI (~20) P:  Lactic acid Nomal Has not required pressors  Stress dose steroids, reduce further as BP remains  wnl Lin was dced'  RENAL  A: ARF, ATN likely frmo septic shock -resolving? hypernatremia P:  Lactic acid repeat NL Cr improving 3.65 >> 1.41>> normal  UOP: 1.6L/24h (-410 yesterday) Increase d5w kvo saline Phos, mag NL Na rising 150, Cl 115 - increasing free water  GASTROINTESTINAL  A: NPO, necrotizing pelvic infection, see ID      S/p Exploratory laparotomy with drainage of retroperitoneal and preperitoneal soft tissue infection P:  ppi  slp needed  HEMATOLOGIC  A: dvt prevention, cbc error 2/21? P:  scd  sub q heparin  Repeat cbc now, he has good clinical status  INFECTIOUS  A: Necrotizing pelvic infection s/p bladder surgery, likely related to bladder as source, HCAP vs aspiration  S/p Exploratory laparotomy with drainage of retroperitoneal and preperitoneal soft tissue infection P:  Drainage ~ 100 ml  Mero maintain and follow gram neg in blood sens, if sens pan, change to unasyn Cultures above Wound care sacrum; Sacral foam for protection with Q shiftmonitoring for evolution of wound.   Surgery and urology following WBC elevated 29.2, s/p surgery and steroid use and error? Still favor polymicrobial treatment , regardless off gram neg isolate If WBC continues to rise, will need repeat imaging  ENDOCRINE  A: R/o rel AI  P:  Cortisol 21.4 Reduce steroids ssi   NEUROLOGIC  A: Dementia, pain P:  IS fent prn No benzo  upright  TODAY'S SUMMARY: Extubated yesterday. Doing well. To floor, repeat cbc, to unasyn if pansens e coli in blood noted, slp   Kuneff, [place] DO PGY-2 02/21/2013, 5:31 AM  I have fully examined this patient and agree with above findings.    And edited infull  Lavon Paganini. Titus Mould, MD, [place] Pgr: [facility]

## 2013-02-21 NOTE — Progress Notes (Signed)
3 Days Post-Op Subjective: Patient reports no new complaints. He is taking by mouth. He was given Pyridium.  Objective: Vital signs in last 24 hours: Temp:  [97 F (36.1 C)-98.3 F (36.8 C)] 98.3 F (36.8 C) (02/21 0740) Pulse Rate:  [56-78] 58 (02/21 1000) Resp:  [15-30] 18 (02/21 1000) BP: (108-146)/(46-75) 135/53 mmHg (02/21 0800) SpO2:  [90 %-99 %] 95 % (02/21 1000)  Intake/Output from previous day: 02/20 0701 - 02/21 0700 In: 1479.7 [I.V.:1079.7; NG/GT:100; IV Piggyback:300] Out: 3716 [Urine:1730; Drains:80] Intake/Output this shift: Total I/O In: -  Out: 395 [Urine:330; Drains:65]  Physical Exam:  His wound looks good with no erythema or evidence of purulent discharge. I see no Pyridium staining on the dressings. His drain has serous sanguinous fluid but no Pyridium staining evident. His Foley catheter is draining and is clear.  Lab Results:  Recent Labs  02/21/13 0312  HGB 11.4*  HCT 32.8*   BMET  Recent Labs  02/20/13 0432 02/21/13 0312  NA 147 150*  K 4.3 3.9  CL 114* 115*  CO2 22 24  GLUCOSE 187* 136*  BUN 39* 40*  CREATININE 1.02 1.09  CALCIUM 8.0* 8.0*    Recent Labs  02/18/13 2032  INR 1.19   No results found for this basename: LABURIN,  in the last 72 hours Results for orders placed during the hospital encounter of 02/18/13  URINE CULTURE     Status: None   Collection Time    02/18/13  7:20 AM      Result Value Ref Range Status   Specimen Description URINE, CLEAN CATCH   Final   Special Requests NONE   Final   Culture  Setup Time     Final   Value: 02/18/2013 10:26     Performed at [place]     Final   Value: >=100,000 COLONIES/ML     Performed at [facility]   Culture     Final   Value: ESCHERICHIA COLI     Performed at [facility]   Report Status PENDING   Incomplete  CULTURE, BLOOD (ROUTINE X 2)     Status: None   Collection Time    02/18/13  8:15 AM      Result Value Ref Range  Status   Specimen Description BLOOD SITE NOT SPECIFIED DRAWN BY RN   Final   Special Requests BOTTLES DRAWN AEROBIC AND ANAEROBIC 4CC   Final   Culture NO GROWTH 3 DAYS   Final   Report Status PENDING   Incomplete  CULTURE, BLOOD (ROUTINE X 2)     Status: None   Collection Time    02/18/13  8:30 AM      Result Value Ref Range Status   Specimen Description BLOOD RIGHT ARM   Final   Special Requests BOTTLES DRAWN AEROBIC ONLY [facility]   Final   Culture  Setup Time     Final   Value: 02/20/2013 01:21     Performed at [facility]   Culture     Final   Value: ESCHERICHIA COLI     1822 Note: Gram Stain Report Called to,Read Back By and Verified With: Girard Cooter 02/19/2013 [place] Performed at [facility]     Performed at [facility]   Report Status PENDING   Incomplete  MRSA PCR SCREENING     Status: None   Collection Time    02/18/13 10:13 AM  Result Value Ref Range Status   MRSA by PCR NEGATIVE  NEGATIVE Final   Comment:            The GeneXpert MRSA Assay (FDA     approved for NASAL specimens     only), is one component of a     comprehensive MRSA colonization     surveillance program. It is not     intended to diagnose MRSA     infection nor to guide or     monitor treatment for     MRSA infections.  AFB CULTURE WITH SMEAR     Status: None   Collection Time    02/19/13 12:28 AM      Result Value Ref Range Status   Specimen Description ABSCESS ABDOMEN   Final   Special Requests PRE PERITONEAL   Final   ACID FAST SMEAR     Final   Value: NO ACID FAST BACILLI SEEN     Performed at [facility]   Culture     Final   Value: CULTURE WILL BE EXAMINED FOR 6 WEEKS BEFORE ISSUING A FINAL REPORT     Performed at [facility]   Report Status PENDING   Incomplete  ANAEROBIC CULTURE     Status: None   Collection Time    02/19/13 12:30 AM      Result Value Ref Range Status   Specimen Description ABSCESS ABDOMEN   Final   Special Requests  PRE PERITONEAL   Final   Gram Stain     Final   Value: ABUNDANT WBC PRESENT,BOTH PMN AND MONONUCLEAR     NO SQUAMOUS EPITHELIAL CELLS SEEN     MODERATE GRAM NEGATIVE RODS     Performed at [facility]   Culture     Final   Value: NO ANAEROBES ISOLATED; CULTURE IN PROGRESS FOR 5 DAYS     Performed at [facility]   Report Status PENDING   Incomplete  CULTURE, ROUTINE-ABSCESS     Status: None   Collection Time    02/19/13 12:31 AM      Result Value Ref Range Status   Specimen Description ABSCESS ABDOMEN   Final   Special Requests PRE PERITONEAL   Final   Gram Stain     Final   Value: ABUNDANT WBC PRESENT,BOTH PMN AND MONONUCLEAR     NO SQUAMOUS EPITHELIAL CELLS SEEN     MODERATE GRAM NEGATIVE RODS     Performed at [facility]   Culture     Final   Value: MODERATE ESCHERICHIA COLI     Performed at [facility]   Report Status 02/21/2013 FINAL   Final   Organism ID, Bacteria ESCHERICHIA COLI   Final  FUNGUS CULTURE W SMEAR     Status: None   Collection Time    02/19/13 12:33 AM      Result Value Ref Range Status   Specimen Description ABSCESS ABDOMEN   Final   Special Requests PRE PERITONEAL   Final   Fungal Smear     Final   Value: NO YEAST OR FUNGAL ELEMENTS SEEN     Performed at [facility]   Culture     Final   Value: CULTURE IN PROGRESS FOR FOUR WEEKS     Performed at [facility]   Report Status PENDING   Incomplete  CULTURE, RESPIRATORY (NON-EXPECTORATED)     Status: None   Collection Time  02/19/13  5:38 AM      Result Value Ref Range Status   Specimen Description TRACHEAL ASPIRATE   Final   Special Requests NONE   Final   Gram Stain     Final   Value: NO WBC SEEN     NO SQUAMOUS EPITHELIAL CELLS SEEN     NO ORGANISMS SEEN     Performed at [facility]   Culture     Final   Value: Non-Pathogenic Oropharyngeal-type Flora Isolated.     Performed at [facility]   Report Status PENDING    Incomplete  CULTURE, RESPIRATORY (NON-EXPECTORATED)     Status: None   Collection Time    02/19/13  9:53 AM      Result Value Ref Range Status   Specimen Description TRACHEAL ASPIRATE   Final   Special Requests NONE   Final   Gram Stain     Final   Value: FEW WBC PRESENT, PREDOMINANTLY PMN     RARE SQUAMOUS EPITHELIAL CELLS PRESENT     RARE GRAM POSITIVE COCCI     IN PAIRS     Performed at [facility]   Culture     Final   Value: Non-Pathogenic Oropharyngeal-type Flora Isolated.     Performed at [facility]   Report Status PENDING   Incomplete    Studies/Results: Dg Chest Port 1 View  02/21/2013   CLINICAL DATA:  Pneumonia, extubated  EXAM: PORTABLE CHEST - 1 VIEW  COMPARISON:  02/20/2013  FINDINGS: Interval extubation. Right IJ central line and NG tube have also been removed. No pneumothorax. Slight worsening basilar airspace disease and left lower lobe consolidation/ collapse. Small effusions not excluded. Stable upper lobe aeration.  IMPRESSION: Interval extubation.  Slight worsening basilar airspace process and left lower lobe collapse/consolidation, compatible with pneumonia.   Electronically Signed   By: Daryll Brod M.D.   On: 02/21/2013 07:42   Dg Chest Port 1 View  02/20/2013   CLINICAL DATA:  68 year old male intubated. Sepsis. Initial encounter.  EXAM: PORTABLE CHEST - 1 VIEW  COMPARISON:  02/1913 and earlier.  FINDINGS: Portable AP semi upright view at 0527 hr. Endotracheal tube tip projects below the clavicles now all, about 3 cm above the carina. Enteric tube courses to the left upper quadrant, tip not included. Stable right IJ central line.  Improved bibasilar ventilation with better definition of the diaphragm. Residual patchy and reticular nodular medial lung base density greater on the right. No pneumothorax. No edema. No definite effusion. Stable cardiac size and mediastinal contours.  IMPRESSION: 1. Lines and tubes appear appropriately placed as above.  2. Improved ventilation. Residual medial lung base opacity greater on the right.   Electronically Signed   By: Lars Pinks M.D.   On: 02/20/2013 07:26   Dg Chest Port 1 View  02/19/2013   CLINICAL DATA:  Repositioning of endotracheal tube.  EXAM: PORTABLE CHEST - 1 VIEW  COMPARISON:  DG CHEST 1V PORT dated 02/19/2013  FINDINGS: The endotracheal tube tip now lies 5.7 cm above the crotch of the carina. The lungs are well-expanded. Increased density at the right lung base persists and is consistent with atelectasis or pneumonia. The cardiac silhouette is not enlarged. The pulmonary vascularity is not engorged. The esophagogastric tube tip and proximal port project below the inferior margin of the image. The right internal jugular venous catheter tip lies in the region of the midportion of the SVC.  IMPRESSION: There has been  adequate interval repositioning of the endotracheal tube. The remainder of the examination is unchanged.   Electronically Signed   By: David  Martinique   On: 02/19/2013 10:26    Assessment/Plan: He appears to be doing well clinically although he does have an elevated white count. There does not appear to be any evidence for extravasation from the bladder with his JP drain output diminishing each day and no evidence of peridium staining in the drain fluid or on the dressings.  Continue Foley catheter drainage.   LOS: 3 days   Wayman Hoard C 02/21/2013, 10:18 AM

## 2013-02-21 NOTE — Progress Notes (Signed)
Entered patient's room to give a breathing treatment and pt had taken off his [place], Sat 80%.  Started breathing treatment on 10L, sat increased to 97%.  Placed pt on 6L [place] after treatment and sats dropped to79%.  Placed pt and 55% VM, Sat 83%. Placed pt on NRB @ 15L Sat increased to 95%.  Pt placed on 50% VM: Sat 92%.

## 2013-02-22 ENCOUNTER — Inpatient Hospital Stay (HOSPITAL_COMMUNITY): Payer: Medicare (Managed Care)

## 2013-02-22 DIAGNOSIS — D72829 Elevated white blood cell count, unspecified: Secondary | ICD-10-CM

## 2013-02-22 DIAGNOSIS — M726 Necrotizing fasciitis: Secondary | ICD-10-CM

## 2013-02-22 DIAGNOSIS — R7881 Bacteremia: Secondary | ICD-10-CM

## 2013-02-22 LAB — CBC
HEMATOCRIT: 36.2 % — AB (ref 39.0–52.0)
HEMOGLOBIN: 12.4 g/dL — AB (ref 13.0–17.0)
MCH: 23.9 pg — ABNORMAL LOW (ref 26.0–34.0)
MCHC: 34.3 g/dL (ref 30.0–36.0)
MCV: 69.9 fL — ABNORMAL LOW (ref 78.0–100.0)
Platelets: 266 10*3/uL (ref 150–400)
RBC: 5.18 MIL/uL (ref 4.22–5.81)
RDW: 13.3 % (ref 11.5–15.5)
WBC: 27.6 10*3/uL — AB (ref 4.0–10.5)

## 2013-02-22 LAB — BASIC METABOLIC PANEL
BUN: 29 mg/dL — AB (ref 6–23)
CHLORIDE: 109 meq/L (ref 96–112)
CO2: 27 mEq/L (ref 19–32)
CREATININE: 0.89 mg/dL (ref 0.50–1.35)
Calcium: 8.1 mg/dL — ABNORMAL LOW (ref 8.4–10.5)
GFR, EST NON AFRICAN AMERICAN: 85 mL/min — AB (ref >90)
Glucose, Bld: 104 mg/dL — ABNORMAL HIGH (ref 70–99)
Potassium: 3.4 mEq/L — ABNORMAL LOW (ref 3.7–5.3)
Sodium: 147 mEq/L (ref 137–147)

## 2013-02-22 LAB — GLUCOSE, CAPILLARY
GLUCOSE-CAPILLARY: 205 mg/dL — AB (ref 70–99)
Glucose-Capillary: 104 mg/dL — ABNORMAL HIGH (ref 70–99)
Glucose-Capillary: 124 mg/dL — ABNORMAL HIGH (ref 70–99)
Glucose-Capillary: 156 mg/dL — ABNORMAL HIGH (ref 70–99)
Glucose-Capillary: 164 mg/dL — ABNORMAL HIGH (ref 70–99)
Glucose-Capillary: 91 mg/dL (ref 70–99)

## 2013-02-22 LAB — URINE CULTURE

## 2013-02-22 LAB — CULTURE, BLOOD (ROUTINE X 2)

## 2013-02-22 MED ORDER — VANCOMYCIN HCL IN DEXTROSE 1-5 GM/200ML-% IV SOLN
1000.0000 mg | Freq: Two times a day (BID) | INTRAVENOUS | Status: DC
  Administered 2013-02-22 – 2013-02-23 (×2): 1000 mg via INTRAVENOUS
  Filled 2013-02-22 (×3): qty 200

## 2013-02-22 MED ORDER — PIPERACILLIN-TAZOBACTAM 3.375 G IVPB
3.3750 g | Freq: Three times a day (TID) | INTRAVENOUS | Status: DC
  Administered 2013-02-22 – 2013-02-23 (×3): 3.375 g via INTRAVENOUS
  Filled 2013-02-22 (×4): qty 50

## 2013-02-22 MED ORDER — STARCH (THICKENING) PO POWD
ORAL | Status: DC | PRN
  Filled 2013-02-22: qty 227

## 2013-02-22 MED ORDER — POTASSIUM CL IN DEXTROSE 5% 20 MEQ/L IV SOLN
20.0000 meq | INTRAVENOUS | Status: AC
  Administered 2013-02-22 – 2013-02-23 (×2): 20 meq via INTRAVENOUS
  Filled 2013-02-22 (×4): qty 1000

## 2013-02-22 NOTE — Progress Notes (Signed)
Patient Demographics  Jason [name], is a 68 y.o. male, DOB - 21-Aug-1945, FY:[id number]  Admit date - 02/18/2013   Admitting Physician Juanito Doom, MD  Outpatient Primary MD for the patient is [name], MD  LOS - 4   Chief Complaint  Patient presents with  . Pulled Foley out        BRIEF PATIENT DESCRIPTION: 68 yo AAF s/p TURP procedure, presented to [facility] after pulling out his foley catheter he was  Seen by urology for hematuria, found to have a lobulated mass in bladder and and underwent cystoscopy, TURBT and was discharged home with Foley on 2/13 from  [facility].  This admission his initial lab work revealed ARF, UTI, suspected aspiration pneumonia, and sepsis. He was febrile and hypoxic, with labs and symptoms of sepsis. CT scan ab/pelvis discussed w/ evidence of soft tissue infection in the pelvis with extension to the fascia of the rectus muscles and of the left internal obturator muscle consistent with necrotizing fasciitis. No visible pus collections in the pelvis as well as extensive bilateral lower lobe pneumonia. Blood pressures became soft, but pt responded well to 3L IVF.   She was admitted to pulmonary critical care, required intubation and mechanical ventilation from 02/18/2013 to 02/20/2013, he went to the OR and was operated upon by urology and general surgery, on 02/21/2013 he was transferred to the floor under the care of hospitalist by pulmonary critical care.   Have assumed his care on 02/22/2013   Assessment & Plan    1.Severe Sepsis with possible necrotizing fasciitis of the rectus muscle and left internal obturator , also suspected HCAP - currently hemodynamically stable, status post exploratory laparotomy and drainage by general surgery  and urology on 02/18/2013, status post intubation and extubation was extubated on 02/20/2013.   Still has significant leukocytosis and a toxic appearance, continue on broad-spectrum antibiotic which will include meropenem, will add vancomycin as he has a cough which is productive, Bronch evidence of HCAP with report suggesting possibly aspirated. Will request infectious disease to provide their input also. Of note patient received 2 days of clindamycin during his initial hospital stay. Continue monitoring JP drain and Foley catheter output. Urology and general surgery following. Will involve ID for their input.   Note blood cultures growing Escherichia coli, bronchitis growing rare gram-positive cocci in pairs.     2.HCAP - will add vancomycin as he has a cough which is productive, Bronch evidence of HCAP with report suggesting possibly aspirated, repeat chest x-ray, and pulmonary toiletry and chest PT. Oxygen nebulizer treatments as needed.     3. Acute renal failure. Due to severe sepsis. Resolved with IV fluids, continue gentle IV fluids and monitor BMP.    4. Hypokalemia. Replace and monitor.    5. Dementia. Will remain at risk for delirium. Supportive care, avoid benzos.    6. History of alcohol abuse. Counseled to quit alcohol. Foley catheter thiamine and multivitamins. No signs of DTs.    7. History of  Hematuria and Urothelial carcinoma - follows with urology at [facility], post discharge we'll follow with them.       Code Status: Full  Family Communication:   Disposition Plan: SNF   Procedures  2/18 CT abd/pelvis w/o contrast: Evidence consistent with anaerobic soft tissue infection in the pelvis with extension to the fascia of the rectus muscles and of the left internal obturator muscle consistent with necrotizing fasciitis. No visible pus collections in the pelvis. Extensive bilateral lower lobe pneumonia.  2/18 US Renal: No hydronephrosis.  2/18 CXR:  Bibasilar opacities question pneumonia versus aspiration. Potential postoperative ileus. Bubbly gas extending farther left laterally in the inferior pelvis than is typically seen with stool, question prominent stool versus extraperitoneal extraluminal gas in patient who has had a recent cystoscopy and TURBT  2/18 Exploratory laparotomy with drainage of retroperitoneal and preperitoneal soft tissue infection  2/19 CXR Endotracheal tube appears in good position. New right lower lobe atelectasis. Interval placement NG tube with side port is just above GE  Junction.  2/19 Bronchoscopy,Sent purulent fluid  2/20 extubated     LINES / TUBES:  2/18 Foley Cath>>  2/18 PIV x1>>  2/18 RIJ>>>2/20  2/19 OETT>>2/20     CULTURES:  2/18 blood culture>> gram neg rods>>>  2/18 urine culture>>E. Coli>>>  2/18 sputum culture: neg, final pending  2/19 wound cultures >> gram neg rods>>>E coli (pansens)  2/19 fungus culture >> negative  2/19 Broch: rare gram positive cocci in pairs final pending     Consults Urology,CCS, PCCM, ID cord.    Medications  Scheduled Meds: . albuterol  2.5 mg Nebulization BID  . antiseptic oral rinse  15 mL Mouth Rinse QID  . chlorhexidine  15 mL Mouth Rinse BID  . folic acid  1 mg Intravenous Daily  . heparin subcutaneous  5,000 Units Subcutaneous 3 times per day  . hydrocortisone sod succinate (SOLU-CORTEF) inj  25 mg Intravenous Q12H  . insulin aspart  0-9 Units Subcutaneous 6 times per day  . meropenem (MERREM) IV  1 g Intravenous 3 times per day  . pantoprazole (PROTONIX) IV  40 mg Intravenous Q24H  . sodium chloride  1,000 mL Intravenous Once  . sodium chloride  3 mL Intravenous Q12H  . thiamine  100 mg Intravenous Daily   Continuous Infusions: . sodium chloride Stopped (02/20/13 1600)  . dextrose 60 mL/hr at 02/21/13 0805  . feeding supplement (VITAL AF 1.2 CAL) Stopped (02/20/13 0900)  . fentaNYL infusion INTRAVENOUS Stopped (02/20/13 0746)  .  norepinephrine (LEVOPHED) Adult infusion    . phenylephrine (NEO-SYNEPHRINE) Adult infusion     PRN Meds:.acetaminophen, acetaminophen, fentaNYL, fentaNYL, metoprolol, ondansetron (ZOFRAN) IV, ondansetron  DVT Prophylaxis    Heparin   Lab Results  Component Value Date   PLT 266 02/22/2013    Antibiotics     Anti-infectives   Start     Dose/Rate Route Frequency Ordered Stop   02/19/13 2300  vancomycin (VANCOCIN) IVPB 750 mg/150 ml premix  Status:  Discontinued     750 mg 150 mL/hr over 60 Minutes Intravenous Every 12 hours 02/19/13 1436 02/20/13 0943   02/19/13 1500  meropenem (MERREM) 1 g in sodium chloride 0.9 % 100 mL IVPB     1 g 200 mL/hr over 30 Minutes Intravenous 3 times per day 02/19/13 1436     02/19/13 1000  vancomycin (VANCOCIN) IVPB 750 mg/150 ml premix  Status:  Discontinued     750 mg 150 mL/hr over 60 Minutes Intravenous Every 24 hours 02/18/13 1031 02/19/13 1437   02/18/13 1800  clindamycin (CLEOCIN) IVPB 900 mg  Status:  Discontinued     900 mg 100 mL/hr over 30 Minutes Intravenous 4 times per day  02/18/13 1626 02/20/13 0943   02/18/13 1800  meropenem (MERREM) 500 mg in sodium chloride 0.9 % 50 mL IVPB  Status:  Discontinued     500 mg 100 mL/hr over 30 Minutes Intravenous Every 12 hours 02/18/13 1649 02/19/13 1436   02/18/13 1200  piperacillin-tazobactam (ZOSYN) IVPB 2.25 g  Status:  Discontinued     2.25 g 100 mL/hr over 30 Minutes Intravenous 4 times per day 02/18/13 1026 02/18/13 1626   02/18/13 1100  vancomycin (VANCOCIN) 1,500 mg in sodium chloride 0.9 % 500 mL IVPB     1,500 mg 250 mL/hr over 120 Minutes Intravenous  Once 02/18/13 1026 02/18/13 1300   02/18/13 0800  cefTRIAXone (ROCEPHIN) 1 g in dextrose 5 % 50 mL IVPB     1 g Intravenous  Once 02/18/13 0752 02/18/13 0848          Subjective:   Jason [name] today has, No headache, No chest pain, No abdominal pain - No Nausea, No new weakness tingling or numbness, No Cough - SOB.   Objective:    Filed Vitals:   02/22/13 0021 02/22/13 0414 02/22/13 0500 02/22/13 0943  BP: 150/68 163/70    Pulse: 66 69    Temp: 98.7 F (37.1 C) 98.2 F (36.8 C)    TempSrc: Oral Oral    Resp: 20 20    Height:      Weight:   71.4 kg (157 lb 6.5 oz)   SpO2: 99% 97%  98%    Wt Readings from Last 3 Encounters:  02/22/13 71.4 kg (157 lb 6.5 oz)  02/22/13 71.4 kg (157 lb 6.5 oz)  02/10/13 67.586 kg (149 lb)     Intake/Output Summary (Last 24 hours) at 02/22/13 1045 Last data filed at 02/22/13 0651  Gross per 24 hour  Intake   1629 ml  Output   1870 ml  Net   -241 ml     Physical Exam  Awake Alert, Oriented X 3, No new F.N deficits, Normal affect [place].AT,PERRAL Supple Neck,No JVD, No cervical lymphadenopathy appriciated.  Symmetrical Chest wall movement, Good air movement bilaterally, Coarse Bilat B sounds RRR,No Gallops,Rubs or new Murmurs, No Parasternal Heave +ve B.Sounds, Abd Soft, Non tender, No organomegaly appriciated, No rebound . JP drain and foley in place No Cyanosis, Clubbing or edema, No new Rash or bruise      Data Review   Micro Results Recent Results (from the past 240 hour(s))  URINE CULTURE     Status: None   Collection Time    02/18/13  7:20 AM      Result Value Ref Range Status   Specimen Description URINE, CLEAN CATCH   Final   Special Requests NONE   Final   Culture  Setup Time     Final   Value: 02/18/2013 10:26     Performed at [facility]     Final   Value: >=100,000 COLONIES/ML     Performed at [facility]   Culture     Final   Value: ESCHERICHIA COLI     Performed at [facility]   Report Status PENDING   Incomplete  CULTURE, BLOOD (ROUTINE X 2)     Status: None   Collection Time    02/18/13  8:15 AM      Result Value Ref Range Status   Specimen Description BLOOD SITE NOT SPECIFIED DRAWN BY RN   Final   Special Requests BOTTLES DRAWN  AEROBIC AND ANAEROBIC 4CC   Final   Culture NO GROWTH 4 DAYS    Final   Report Status PENDING   Incomplete  CULTURE, BLOOD (ROUTINE X 2)     Status: None   Collection Time    02/18/13  8:30 AM      Result Value Ref Range Status   Specimen Description BLOOD RIGHT ARM   Final   Special Requests BOTTLES DRAWN AEROBIC ONLY [facility]   Final   Culture  Setup Time     Final   Value: 02/20/2013 01:21     Performed at [facility]   Culture     Final   Value: ESCHERICHIA COLI     1822 Note: Gram Stain Report Called to,Read Back By and Verified With: Girard Cooter 02/19/2013 B[place]GHAM Performed at [facility]     Performed at [facility]   Report Status 02/22/2013 FINAL   Final   Organism ID, Bacteria ESCHERICHIA COLI   Final  MRSA PCR SCREENING     Status: None   Collection Time    02/18/13 10:13 AM      Result Value Ref Range Status   MRSA by PCR NEGATIVE  NEGATIVE Final   Comment:            The GeneXpert MRSA Assay (FDA     approved for NASAL specimens     only), is one component of a     comprehensive MRSA colonization     surveillance program. It is not     intended to diagnose MRSA     infection nor to guide or     monitor treatment for     MRSA infections.  AFB CULTURE WITH SMEAR     Status: None   Collection Time    02/19/13 12:28 AM      Result Value Ref Range Status   Specimen Description ABSCESS ABDOMEN   Final   Special Requests PRE PERITONEAL   Final   ACID FAST SMEAR     Final   Value: NO ACID FAST BACILLI SEEN     Performed at [facility]   Culture     Final   Value: CULTURE WILL BE EXAMINED FOR 6 WEEKS BEFORE ISSUING A FINAL REPORT     Performed at [facility]   Report Status PENDING   Incomplete  ANAEROBIC CULTURE     Status: None   Collection Time    02/19/13 12:30 AM      Result Value Ref Range Status   Specimen Description ABSCESS ABDOMEN   Final   Special Requests PRE PERITONEAL   Final   Gram Stain     Final   Value: ABUNDANT WBC PRESENT,BOTH PMN AND MONONUCLEAR     NO SQUAMOUS  EPITHELIAL CELLS SEEN     MODERATE GRAM NEGATIVE RODS     Performed at [facility]   Culture     Final   Value: NO ANAEROBES ISOLATED; CULTURE IN PROGRESS FOR 5 DAYS     Performed at [facility]   Report Status PENDING   Incomplete  CULTURE, ROUTINE-ABSCESS     Status: None   Collection Time    02/19/13 12:31 AM      Result Value Ref Range Status   Specimen Description ABSCESS ABDOMEN   Final   Special Requests PRE PERITONEAL   Final   Gram Stain     Final   Value: ABUNDANT WBC  PRESENT,BOTH PMN AND MONONUCLEAR     NO SQUAMOUS EPITHELIAL CELLS SEEN     MODERATE GRAM NEGATIVE RODS     Performed at [facility]   Culture     Final   Value: MODERATE ESCHERICHIA COLI     Performed at [facility]   Report Status 02/21/2013 FINAL   Final   Organism ID, Bacteria ESCHERICHIA COLI   Final  FUNGUS CULTURE W SMEAR     Status: None   Collection Time    02/19/13 12:33 AM      Result Value Ref Range Status   Specimen Description ABSCESS ABDOMEN   Final   Special Requests PRE PERITONEAL   Final   Fungal Smear     Final   Value: NO YEAST OR FUNGAL ELEMENTS SEEN     Performed at [facility]   Culture     Final   Value: CULTURE IN PROGRESS FOR FOUR WEEKS     Performed at [facility]   Report Status PENDING   Incomplete  CULTURE, RESPIRATORY (NON-EXPECTORATED)     Status: None   Collection Time    02/19/13  5:38 AM      Result Value Ref Range Status   Specimen Description TRACHEAL ASPIRATE   Final   Special Requests NONE   Final   Gram Stain     Final   Value: NO WBC SEEN     NO SQUAMOUS EPITHELIAL CELLS SEEN     NO ORGANISMS SEEN     Performed at [facility]   Culture     Final   Value: Non-Pathogenic Oropharyngeal-type Flora Isolated.     Performed at [facility]   Report Status 02/21/2013 FINAL   Final  CULTURE, RESPIRATORY (NON-EXPECTORATED)     Status: None   Collection Time    02/19/13  9:53 AM      Result  Value Ref Range Status   Specimen Description TRACHEAL ASPIRATE   Final   Special Requests NONE   Final   Gram Stain     Final   Value: FEW WBC PRESENT, PREDOMINANTLY PMN     RARE SQUAMOUS EPITHELIAL CELLS PRESENT     RARE GRAM POSITIVE COCCI     IN PAIRS     Performed at [facility]   Culture     Final   Value: Non-Pathogenic Oropharyngeal-type Flora Isolated.     Performed at [facility]   Report Status 02/21/2013 FINAL   Final    Radiology Reports Ct Abdomen Pelvis Wo Contrast  02/18/2013   CLINICAL DATA:  Fever. Recent transurethral resection of bladder tumor. Extraluminal gas collection in the left side of the pelvis.  EXAM: CT ABDOMEN AND PELVIS WITHOUT CONTRAST  TECHNIQUE: Multidetector CT imaging of the abdomen and pelvis was performed following the standard protocol without intravenous contrast.  COMPARISON:  Radiograph dated 02/18/2013 and CT scan dated 08/30/2006  FINDINGS: There are bilateral extensive lower lobe consolidative pulmonary infiltrates. No effusions. Heart size is normal. Tiny hiatal hernia.  There is extensive extraluminal air in the soft tissues of the pelvis including the presacral space and along the left side of the pelvis along the fascia of the left antral operator muscle as well as extending anteriorly along the deep fascia of the rectus muscles. This is consistent with and anaerobic infection and possible necrotizing fasciitis.  The liver, biliary tree, spleen, pancreas, adrenal glands, and kidneys demonstrate no significant abnormality. The  bowel appears normal. Foley catheter is in place in the bladder with a small amount of air in the bladder.  IMPRESSION: 1. Evidence consistent with anaerobic soft tissue infection in the pelvis with extension to the fascia of the rectus muscles and of the left internal obturator muscle consistent with necrotizing fasciitis. No visible pus collections in the pelvis. 2. Extensive bilateral lower lobe pneumonia.   Critical Value/emergent results were called by telephone at the time of interpretation on 02/18/2013 at 3:55 PM to Dr. Murray Hodgkins , who verbally acknowledged these results.   Electronically Signed   By: Rozetta Nunnery M.D.   On: 02/18/2013 15:56     US Renal  02/18/2013   CLINICAL DATA:  Acute renal failure  EXAM: RENAL/URINARY TRACT ULTRASOUND COMPLETE  COMPARISON:  01/13/2013  FINDINGS: Right Kidney:  Length: 11.2 cm.  No mass or hydronephrosis.  Left Kidney:  Length: 11.0 cm.  No mass or hydronephrosis.  Bladder:  Poorly visualized/decompressed by indwelling Foley catheter.  IMPRESSION: No hydronephrosis.  Bladder poorly visualized/decompressed by indwelling Foley catheter.   Electronically Signed   By: Julian Hy M.D.   On: 02/18/2013 13:36      Dg Abd 2 Views  02/18/2013   CLINICAL DATA:  Bladder tumor, urothelial carcinoma, possible vomiting, post TURBT 02/13/2013  EXAM: ABDOMEN - 2 VIEW  COMPARISON:  None  FINDINGS: Bibasilar infiltrates question pneumonia versus aspiration.  Air-filled nondistended small bowel loops the mid abdomen, question postoperative ileus.  Bubbly gas identified in the pelvis, could be related to stool within the distal colon but extending farther left lateral than is typically seen with rectal vault stool, raising question of extraperitoneal gas in the left pelvis.  Bones unremarkable.  No urinary tract calcification.  No definite free intraperitoneal air.  IMPRESSION: Bibasilar opacities question pneumonia versus aspiration.  Potential postoperative ileus.  Bubbly gas extending farther left laterally in the inferior pelvis than is typically seen with stool, question prominent stool versus extraperitoneal extraluminal gas in patient who has had a recent cystoscopy and TURBT ; CT imaging of the abdomen and pelvis with IV and oral contrast recommended.  Findings called to Dr. Sarajane Jews on 02/18/2013 at 1315 hr.   Electronically Signed   By: Lavonia Dana M.D.   On:  02/18/2013 13:16    CBC  Recent Labs Lab 02/18/13 0647 02/21/13 0312 02/21/13 0955 02/22/13 0805  WBC 14.4* 29.2* 26.7* 27.6*  HGB 13.9 11.4* 10.9* 12.4*  HCT 39.9 32.8* 31.8* 36.2*  PLT 141* 227 238 266  MCV 68.7* 69.2* 70.2* 69.9*  MCH 23.9* 24.1* 24.1* 23.9*  MCHC 34.8 34.8 34.3 34.3  RDW 14.2 13.5 13.9 13.3  LYMPHSABS 0.9  --   --   --   MONOABS 0.7  --   --   --   EOSABS 0.0  --   --   --   BASOSABS 0.0  --   --   --     Chemistries   Recent Labs Lab 02/18/13 2032 02/19/13 0500 02/19/13 1230 02/19/13 2032 02/20/13 0025 02/20/13 0432 02/21/13 0312 02/22/13 0455  NA 144 143 145 149*  --  147 150* 147  K 3.8 4.5 4.4 4.7  --  4.3 3.9 3.4*  CL 112 109 113* 116*  --  114* 115* 109  CO2 20 19 21 21   --  22 24 27   GLUCOSE 109* 103* 122* 152*  --  187* 136* 104*  BUN 63* 51* 42* 39*  --  39* 40* 29*  CREATININE 1.41* 1.13 1.02 1.03  --  1.02 1.09 0.89  CALCIUM 7.2* 7.5* 7.9* 7.8*  --  8.0* 8.0* 8.1*  MG  --  2.5 2.5  --  2.5  --   --   --   AST  --  43*  --   --   --   --   --   --   ALT  --  42  --   --   --   --   --   --   ALKPHOS  --  105  --   --   --   --   --   --   BILITOT  --  1.5*  --   --   --   --   --   --    ------------------------------------------------------------------------------------------------------------------ estimated creatinine clearance is 79.1 ml/min (by C-G formula based on Cr of 0.89). ------------------------------------------------------------------------------------------------------------------ No results found for this basename: HGBA1C,  in the last 72 hours ------------------------------------------------------------------------------------------------------------------ No results found for this basename: CHOL, HDL, LDLCALC, TRIG, CHOLHDL, LDLDIRECT,  in the last 72 hours ------------------------------------------------------------------------------------------------------------------ No results found for this basename: TSH,  T4TOTAL, FREET3, T3FREE, THYROIDAB,  in the last 72 hours ------------------------------------------------------------------------------------------------------------------ No results found for this basename: VITAMINB12, FOLATE, FERRITIN, TIBC, IRON, RETICCTPCT,  in the last 72 hours  Coagulation profile  Recent Labs Lab 02/18/13 2032  INR 1.19    No results found for this basename: DDIMER,  in the last 72 hours  Cardiac Enzymes No results found for this basename: CK, CKMB, TROPONINI, MYOGLOBIN,  in the last 168 hours ------------------------------------------------------------------------------------------------------------------ No components found with this basename: POCBNP,      Time Spent in minutes   35   Kenzly Rogoff K M.D on 02/22/2013 at 10:45 AM  Between 7am to 7pm - Pager - [phone]  After 7pm go to [email or web address] - password [email or web address]  And look for the night coverage person covering for me after hours  [facility] Office  [phone]

## 2013-02-22 NOTE — Consult Note (Addendum)
INFECTIOUS DISEASE CONSULT NOTE  Date of Admission:  02/18/2013  Date of Consult:  02/22/2013  Reason for Consult: Necrotizing Fascitis Referring Physician: Thedore Mins  Impression/Recommendation Necrotizing Fascitis Bacteremia Leukocytosis Bibasilar consolidation, effusions on CXR  Would: Change his anbx to zosyn/vanco Limit vanco to 6 days (for ? HCAP)  Comment- Not clear if WBC is from his CXR findings or his wound (or both). He appears to be improving. Could consider changing his anbx to unsayn after 6 more days to treat his fascitis alone.   Thank you so much for this interesting consult,   Jason [name] (pager) [phone] [email or web address]  Jason [name] is an 68 y.o. male.  HPI: 68 yo M with hx of dementia, and recent detection of bladder mass (TURB/bx 02-13-13) and then on 2-18 presented to ED after accidentally pulling out his foley. In ED he became septic, was noted to have renal failure, and elevated lactate.  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 He was transferred to MICU at [facility] [place], started on merrem, vanco anc clinda. He was taken to OR for exploratory laparotomy on 2-20 , able to be intubated same day (though had BAL showing purulent fluid/Cx -).  His vanco and clinda were stopped on 2-20 when his Cx showed GNR. This was found to be E coli (R- cipro) in his UCx, wound Cx, BCx.  His vanco was restarted today due to concern that he had HCAP. His WBC remains elevated (27.6).   Past Medical History  Diagnosis Date  . Hypertension   . Stroke     Dementia, otherwise, no residual  . Bladder mass 02-10-13    surgery planned for this  . 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 COPD (chronic obstructive pulmonary disease)     previous history and current smoking  . 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 Dementia 02/18/2013    Past Surgical History  Procedure Laterality Date  .  Transurethral resection of bladder tumor with gyrus (turbt-gyrus) N/A 02/13/2013    Procedure: TRANSURETHRAL RESECTION OF BLADDER TUMOR WITH GYRUS (TURBT-GYRUS), bladder biopsies;  Surgeon: Crist Fat, MD;  Location: [facility] ORS;  Service: Urology;  Laterality: N/A;  . Cystoscopy/retrograde/ureteroscopy Bilateral 02/13/2013    Procedure: BILATERAL RETROGRADE;  Surgeon: Crist Fat, MD;  Location: [facility];  Service: Urology;  Laterality: Bilateral;  . Laparotomy N/A 02/18/2013    Procedure: EXPLORATORY LAPAROTOMY drainage of retroperitoneal abscess, drainage of Pre-Peritoneal abscess and Exploration of bladder perforation;  Surgeon: Wilmon Arms. Tsuei, MD;  Location: [facility] OR;  Service: General;  Laterality: N/A;     No Known Allergies  Medications:  Scheduled: . albuterol  2.5 mg Nebulization BID  . antiseptic oral rinse  15 mL Mouth Rinse QID  . chlorhexidine  15 mL Mouth Rinse BID  . folic acid  1 mg Intravenous Daily  . heparin subcutaneous  5,000 Units Subcutaneous 3 times per day  . hydrocortisone sod succinate (SOLU-CORTEF) inj  25 mg Intravenous Q12H  . insulin aspart  0-9 Units Subcutaneous 6 times per day  . meropenem (MERREM) IV  1 g Intravenous 3 times per day  . pantoprazole (PROTONIX) IV  40 mg Intravenous Q24H  . sodium chloride  1,000 mL Intravenous Once  . sodium chloride  3 mL Intravenous Q12H  . thiamine  100 mg Intravenous Daily  . vancomycin  1,000 mg Intravenous Q12H    Total days of antibiotics: 5  Social History:  reports that he has been smoking.  He does not have any smokeless tobacco history on file. He reports that he drinks alcohol. He reports that he does not use illicit drugs.  No family history on file. Unobtainable  General ROS: unobtainable.   Blood pressure 163/70, pulse 69, temperature 98.2 F (36.8 C), temperature source Oral, resp. rate 20, height 6' (1.829 m), weight 71.4 kg (157 lb 6.5 oz), SpO2 98.00%. General appearance: alert,  cooperative and no distress Eyes: negative findings: pupils equal, round, reactive to light and accomodation Throat: normal findings: oropharynx pink & moist without lesions or evidence of thrush Neck: no adenopathy and supple, symmetrical, trachea midline Lungs: rhonchi anterior - bilateral Heart: regular rate and rhythm Abdomen: decreased BS, midline wound is clean, no d/c noted.  Extremities: edema none Neurologic: Mental status: alertness: alert, orientation: person, does not know place or date.  Genital tenderness.    Results for orders placed during the hospital encounter of 02/18/13 (from the past 48 hour(s))  GLUCOSE, CAPILLARY     Status: Abnormal   Collection Time    02/20/13  3:43 PM      Result Value Ref Range   Glucose-Capillary 134 (*) 70 - 99 mg/dL  GLUCOSE, CAPILLARY     Status: Abnormal   Collection Time    02/20/13  6:57 PM      Result Value Ref Range   Glucose-Capillary 129 (*) 70 - 99 mg/dL  GLUCOSE, CAPILLARY     Status: Abnormal   Collection Time    02/21/13 12:17 AM      Result Value Ref Range   Glucose-Capillary 143 (*) 70 - 99 mg/dL   Comment 1 Documented in Chart     Comment 2 Notify RN    CBC     Status: Abnormal   Collection Time    02/21/13  3:12 AM      Result Value Ref Range   WBC 29.2 (*) 4.0 - 10.5 K/uL   RBC 4.74  4.22 - 5.81 MIL/uL   Hemoglobin 11.4 (*) 13.0 - 17.0 g/dL   HCT 32.8 (*) 39.0 - 52.0 %   MCV 69.2 (*) 78.0 - 100.0 fL   MCH 24.1 (*) 26.0 - 34.0 pg   MCHC 34.8  30.0 - 36.0 g/dL   RDW 13.5  11.5 - 15.5 %   Platelets 227  150 - 400 K/uL  BASIC METABOLIC PANEL     Status: Abnormal   Collection Time    02/21/13  3:12 AM      Result Value Ref Range   Sodium 150 (*) 137 - 147 mEq/L   Potassium 3.9  3.7 - 5.3 mEq/L   Chloride 115 (*) 96 - 112 mEq/L   CO2 24  19 - 32 mEq/L   Glucose, Bld 136 (*) 70 - 99 mg/dL   BUN 40 (*) 6 - 23 mg/dL   Creatinine, Ser 1.09  0.50 - 1.35 mg/dL   Calcium 8.0 (*) 8.4 - 10.5 mg/dL   GFR calc non  Af Amer 67 (*) >90 mL/min   GFR calc Af Amer 78 (*) >90 mL/min   Comment: (NOTE)     The eGFR has been calculated using the CKD EPI equation.     This calculation has not been validated in all clinical situations.     eGFR's persistently <90 mL/min signify possible Chronic Kidney     Disease.  GLUCOSE, CAPILLARY     Status: Abnormal  Collection Time    02/21/13  4:09 AM      Result Value Ref Range   Glucose-Capillary 137 (*) 70 - 99 mg/dL   Comment 1 Notify RN     Comment 2 Documented in Chart    GLUCOSE, CAPILLARY     Status: Abnormal   Collection Time    02/21/13  7:18 AM      Result Value Ref Range   Glucose-Capillary 136 (*) 70 - 99 mg/dL  CBC     Status: Abnormal   Collection Time    02/21/13  9:55 AM      Result Value Ref Range   WBC 26.7 (*) 4.0 - 10.5 K/uL   Comment: CONSISTENT WITH PREVIOUS RESULT   RBC 4.53  4.22 - 5.81 MIL/uL   Hemoglobin 10.9 (*) 13.0 - 17.0 g/dL   HCT 31.8 (*) 39.0 - 52.0 %   MCV 70.2 (*) 78.0 - 100.0 fL   MCH 24.1 (*) 26.0 - 34.0 pg   MCHC 34.3  30.0 - 36.0 g/dL   RDW 13.9  11.5 - 15.5 %   Platelets 238  150 - 400 K/uL  GLUCOSE, CAPILLARY     Status: Abnormal   Collection Time    02/21/13 11:18 AM      Result Value Ref Range   Glucose-Capillary 131 (*) 70 - 99 mg/dL  GLUCOSE, CAPILLARY     Status: Abnormal   Collection Time    02/21/13  3:15 PM      Result Value Ref Range   Glucose-Capillary 104 (*) 70 - 99 mg/dL  GLUCOSE, CAPILLARY     Status: Abnormal   Collection Time    02/21/13  8:14 PM      Result Value Ref Range   Glucose-Capillary 126 (*) 70 - 99 mg/dL   Comment 1 Documented in Chart     Comment 2 Notify RN    GLUCOSE, CAPILLARY     Status: Abnormal   Collection Time    02/22/13 12:03 AM      Result Value Ref Range   Glucose-Capillary 104 (*) 70 - 99 mg/dL   Comment 1 Documented in Chart     Comment 2 Notify RN    GLUCOSE, CAPILLARY     Status: Abnormal   Collection Time    02/22/13  4:11 AM      Result Value Ref  Range   Glucose-Capillary 124 (*) 70 - 99 mg/dL   Comment 1 Documented in Chart     Comment 2 Notify RN    BASIC METABOLIC PANEL     Status: Abnormal   Collection Time    02/22/13  4:55 AM      Result Value Ref Range   Sodium 147  137 - 147 mEq/L   Potassium 3.4 (*) 3.7 - 5.3 mEq/L   Chloride 109  96 - 112 mEq/L   CO2 27  19 - 32 mEq/L   Glucose, Bld 104 (*) 70 - 99 mg/dL   BUN 29 (*) 6 - 23 mg/dL   Creatinine, Ser 0.89  0.50 - 1.35 mg/dL   Calcium 8.1 (*) 8.4 - 10.5 mg/dL   GFR calc non Af Amer 85 (*) >90 mL/min   GFR calc Af Amer >90  >90 mL/min   Comment: (NOTE)     The eGFR has been calculated using the CKD EPI equation.     This calculation has not been validated in all clinical situations.     eGFR's  persistently <90 mL/min signify possible Chronic Kidney     Disease.  GLUCOSE, CAPILLARY     Status: None   Collection Time    02/22/13  8:01 AM      Result Value Ref Range   Glucose-Capillary 91  70 - 99 mg/dL  CBC     Status: Abnormal   Collection Time    02/22/13  8:05 AM      Result Value Ref Range   WBC 27.6 (*) 4.0 - 10.5 K/uL   Comment: CONSISTENT WITH PREVIOUS RESULT   RBC 5.18  4.22 - 5.81 MIL/uL   Hemoglobin 12.4 (*) 13.0 - 17.0 g/dL   HCT 65.6 (*) 81.2 - 75.1 %   MCV 69.9 (*) 78.0 - 100.0 fL   MCH 23.9 (*) 26.0 - 34.0 pg   MCHC 34.3  30.0 - 36.0 g/dL   RDW 70.0  17.4 - 94.4 %   Platelets 266  150 - 400 K/uL  GLUCOSE, CAPILLARY     Status: Abnormal   Collection Time    02/22/13 12:20 PM      Result Value Ref Range   Glucose-Capillary 205 (*) 70 - 99 mg/dL   Comment 1 Notify RN        Component Value Date/Time   SDES TRACHEAL ASPIRATE 02/19/2013 0953   SPECREQUEST NONE 02/19/2013 9675   CULT  Value: Non-Pathogenic Oropharyngeal-type Flora Isolated. Performed at [facility] 02/19/2013 9163   REPTSTATUS 02/21/2013 FINAL 02/19/2013 0953   Dg Chest Port 1 View  02/22/2013   CLINICAL DATA:  Basilar airspace disease and effusions  EXAM: PORTABLE  CHEST - 1 VIEW  COMPARISON:  02/21/2013  FINDINGS: Normal heart size and vascularity. Persistent bibasilar atelectasis/consolidation noted with pleural effusions. Given changes in technique, no significant interval change. No pneumothorax. Trachea is midline.  IMPRESSION: Persistent bibasilar atelectasis/consolidation with associated pleural effusions.   Electronically Signed   By: Ruel Favors M.D.   On: 02/22/2013 08:05   Dg Chest Port 1 View  02/21/2013   CLINICAL DATA:  Shortness of breath.  Marland Kitchen  EXAM: PORTABLE CHEST - 1 VIEW  COMPARISON:  DG CHEST 1V PORT dated 02/21/2013  FINDINGS: Mediastinum and hilar structures are normal. Progressive atelectasis right lower lobe. Developing infiltrate left lower lobe. Bilateral pleural effusions. No pneumothorax. Heart size normal. No acute osseous abnormality.  IMPRESSION: 1.  Progressive atelectasis and/or infiltrate right lower lobe.  2.  Developing infiltrate left lower lobe.  3.  Bilateral small pleural effusions.   Electronically Signed   By: Maisie Fus  Register   On: 02/21/2013 23:39   Dg Chest Port 1 View  02/21/2013   CLINICAL DATA:  Pneumonia, extubated  EXAM: PORTABLE CHEST - 1 VIEW  COMPARISON:  02/20/2013  FINDINGS: Interval extubation. Right IJ central line and NG tube have also been removed. No pneumothorax. Slight worsening basilar airspace disease and left lower lobe consolidation/ collapse. Small effusions not excluded. Stable upper lobe aeration.  IMPRESSION: Interval extubation.  Slight worsening basilar airspace process and left lower lobe collapse/consolidation, compatible with pneumonia.   Electronically Signed   By: Ruel Favors M.D.   On: 02/21/2013 07:42   Dg Abd 2 Views  02/22/2013   CLINICAL DATA:  Abdominal pain  EXAM: ABDOMEN - 2 VIEW  COMPARISON:  CT ABD/PELV WO CM dated 02/18/2013  FINDINGS: There is no evidence of free intraperitoneal gas. Mild distention of small and large bowel loops in and ileus pattern are noted. Surgical drain  projects over  the pelvis. Multiple Penrose drains project over the pelvis.  IMPRESSION: No free intraperitoneal gas.  Ileus.  Drains project over the pelvis.   Electronically Signed   By: Maryclare Bean M.D.   On: 02/22/2013 13:11   Recent Results (from the past 240 hour(s))  URINE CULTURE     Status: None   Collection Time    02/18/13  7:20 AM      Result Value Ref Range Status   Specimen Description URINE, CLEAN CATCH   Final   Special Requests NONE   Final   Culture  Setup Time     Final   Value: 02/18/2013 10:26     Performed at [facility]     Final   Value: >=100,000 COLONIES/ML     Performed at [facility]   Culture     Final   Value: ESCHERICHIA COLI     Performed at [facility]   Report Status 02/22/2013 FINAL   Final   Organism ID, Bacteria ESCHERICHIA COLI   Final  CULTURE, BLOOD (ROUTINE X 2)     Status: None   Collection Time    02/18/13  8:15 AM      Result Value Ref Range Status   Specimen Description BLOOD SITE NOT SPECIFIED DRAWN BY RN   Final   Special Requests BOTTLES DRAWN AEROBIC AND ANAEROBIC 4CC   Final   Culture NO GROWTH 4 DAYS   Final   Report Status PENDING   Incomplete  CULTURE, BLOOD (ROUTINE X 2)     Status: None   Collection Time    02/18/13  8:30 AM      Result Value Ref Range Status   Specimen Description BLOOD RIGHT ARM   Final   Special Requests BOTTLES DRAWN AEROBIC ONLY [facility]   Final   Culture  Setup Time     Final   Value: 02/20/2013 01:21     Performed at [facility]   Culture     Final   Value: ESCHERICHIA COLI     1822 Note: Gram Stain Report Called to,Read Back By and Verified With: Girard Cooter 02/19/2013 [place] Performed at [facility]     Performed at [facility]   Report Status 02/22/2013 FINAL   Final   Organism ID, Bacteria ESCHERICHIA COLI   Final  MRSA PCR SCREENING     Status: None   Collection Time    02/18/13 10:13 AM      Result Value Ref Range Status    MRSA by PCR NEGATIVE  NEGATIVE Final   Comment:            The GeneXpert MRSA Assay (FDA     approved for NASAL specimens     only), is one component of a     comprehensive MRSA colonization     surveillance program. It is not     intended to diagnose MRSA     infection nor to guide or     monitor treatment for     MRSA infections.  AFB CULTURE WITH SMEAR     Status: None   Collection Time    02/19/13 12:28 AM      Result Value Ref Range Status   Specimen Description ABSCESS ABDOMEN   Final   Special Requests PRE PERITONEAL   Final   ACID FAST SMEAR     Final   Value: NO ACID FAST BACILLI SEEN  Performed at [facility]     Final   Value: CULTURE WILL BE EXAMINED FOR 6 WEEKS BEFORE ISSUING A FINAL REPORT     Performed at [facility]   Report Status PENDING   Incomplete  ANAEROBIC CULTURE     Status: None   Collection Time    02/19/13 12:30 AM      Result Value Ref Range Status   Specimen Description ABSCESS ABDOMEN   Final   Special Requests PRE PERITONEAL   Final   Gram Stain     Final   Value: ABUNDANT WBC PRESENT,BOTH PMN AND MONONUCLEAR     NO SQUAMOUS EPITHELIAL CELLS SEEN     MODERATE GRAM NEGATIVE RODS     Performed at [facility]   Culture     Final   Value: NO ANAEROBES ISOLATED; CULTURE IN PROGRESS FOR 5 DAYS     Performed at [facility]   Report Status PENDING   Incomplete  CULTURE, ROUTINE-ABSCESS     Status: None   Collection Time    02/19/13 12:31 AM      Result Value Ref Range Status   Specimen Description ABSCESS ABDOMEN   Final   Special Requests PRE PERITONEAL   Final   Gram Stain     Final   Value: ABUNDANT WBC PRESENT,BOTH PMN AND MONONUCLEAR     NO SQUAMOUS EPITHELIAL CELLS SEEN     MODERATE GRAM NEGATIVE RODS     Performed at [facility]   Culture     Final   Value: MODERATE ESCHERICHIA COLI     Performed at [facility]   Report Status 02/21/2013 FINAL   Final   Organism ID,  Bacteria ESCHERICHIA COLI   Final  FUNGUS CULTURE W SMEAR     Status: None   Collection Time    02/19/13 12:33 AM      Result Value Ref Range Status   Specimen Description ABSCESS ABDOMEN   Final   Special Requests PRE PERITONEAL   Final   Fungal Smear     Final   Value: NO YEAST OR FUNGAL ELEMENTS SEEN     Performed at [facility]   Culture     Final   Value: CULTURE IN PROGRESS FOR FOUR WEEKS     Performed at [facility]   Report Status PENDING   Incomplete  CULTURE, RESPIRATORY (NON-EXPECTORATED)     Status: None   Collection Time    02/19/13  5:38 AM      Result Value Ref Range Status   Specimen Description TRACHEAL ASPIRATE   Final   Special Requests NONE   Final   Gram Stain     Final   Value: NO WBC SEEN     NO SQUAMOUS EPITHELIAL CELLS SEEN     NO ORGANISMS SEEN     Performed at [facility]   Culture     Final   Value: Non-Pathogenic Oropharyngeal-type Flora Isolated.     Performed at [facility]   Report Status 02/21/2013 FINAL   Final  CULTURE, RESPIRATORY (NON-EXPECTORATED)     Status: None   Collection Time    02/19/13  9:53 AM      Result Value Ref Range Status   Specimen Description TRACHEAL ASPIRATE   Final   Special Requests NONE   Final   Gram Stain     Final   Value: FEW WBC  PRESENT, PREDOMINANTLY PMN     RARE SQUAMOUS EPITHELIAL CELLS PRESENT     RARE GRAM POSITIVE COCCI     IN PAIRS     Performed at [facility]   Culture     Final   Value: Non-Pathogenic Oropharyngeal-type Flora Isolated.     Performed at [facility]   Report Status 02/21/2013 FINAL   Final      02/22/2013, 2:25 PM     LOS: 4 days

## 2013-02-22 NOTE — Progress Notes (Signed)
Speech Language Pathology Treatment: Dysphagia  Patient Details Name: Jason [name] MRN: [id number] DOB: [birth year] Today's Date: 02/22/2013 Time: 1845-1900 SLP Time Calculation (min): 15 min  Assessment / Plan / Recommendation Clinical Impression  F/u from initial BSE for diet tolerance of dysphagia 3 (mechanical soft) and nectar thick liquids.  Per medical chart Rapid Response called by RN 2/21 at 2236  due to drop in oxygen saturations as patient had removed nasal cannula. CXR 2/22/1Persistent bibasilar atelectasis/consolidation with associated pleural effusions. Lung sounds per nursing Rhonchi in all lobes.   Per RN no coughing noted during am and noon meal and tolerating current modified diet.  Nursing reports family members providing patient with thin water this date but verbalized understanding when given education concerning current diet rec's.  Not observed with PO's during treatment as patient declined PO's from evening meal. No outward clinical s/s of aspiration noted during initial BSE but due to current respiratory status recommend to proceed with objective evaluation to assess risk for aspiration and determine safest, PO diet.  MBS to be completed on 02/23/13.     HPI HPI: Severe Sepsis with possible necrotizing fasciitis of the rectus muscle and left internal obturator , also suspected HCAP - currently hemodynamically stable, status post exploratory laparotomy and drainage by general surgery and urology on 02/18/2013, status post intubation and extubation was extubated on 02/20/2013.       SLP Plan  MBS;New goals to be determined pending instrumental study    Recommendations Diet recommendations: Dysphagia 3 (mechanical soft);Nectar-thick liquid Liquids provided via: Cup;No straw Medication Administration: Crushed with puree Supervision: Staff to assist with self feeding;Full supervision/cueing for compensatory strategies Compensations: Slow rate;Small sips/bites Postural Changes  and/or Swallow Maneuvers: Seated upright 90 degrees;Upright 30-60 min after meal              Oral Care Recommendations: Oral care Q4 per protocol Follow up Recommendations: Skilled Nursing facility Plan: MBS;New goals to be determined pending instrumental study    [place], [place] [facility] 02/22/2013, 7:04 PM

## 2013-02-22 NOTE — Progress Notes (Signed)
ANTIBIOTIC CONSULT NOTE - FOLLOW UP  Pharmacy Consult for vancomycin, meropenem Indication: sepsis  No Known Allergies  Patient Measurements: Height: 6' (182.9 cm) Weight: 157 lb 6.5 oz (71.4 kg) IBW/kg (Calculated) : 77.6   Vital Signs: Temp: 98.2 F (36.8 C) (02/22 0414) Temp src: Oral (02/22 0414) BP: 163/70 mmHg (02/22 0414) Pulse Rate: 69 (02/22 0414) Intake/Output from previous day: 02/21 0701 - 02/22 0700 In: 1776.5 [P.O.:120; I.V.:1356.5; IV Piggyback:300] Out: 2265 [Urine:1960; Drains:305] Intake/Output from this shift: Total I/O In: 200 [Other:200] Out: -   Labs:  Recent Labs  02/20/13 0432 02/21/13 0312 02/21/13 0955 02/22/13 0455 02/22/13 0805  WBC  --  29.2* 26.7*  --  27.6*  HGB  --  11.4* 10.9*  --  12.4*  PLT  --  227 238  --  266  CREATININE 1.02 1.09  --  0.89  --    Estimated Creatinine Clearance: 79.1 ml/min (by C-G formula based on Cr of 0.89). No results found for this basename: VANCOTROUGH, Corlis Leak, VANCORANDOM, [place], GENTPEAK, [place], [place], TOBRAPEAK, TOBRARND, AMIKACINPEAK, AMIKACINTROU, AMIKACIN,  in the last 72 hours   Microbiology: Recent Results (from the past 720 hour(s))  URINE CULTURE     Status: None   Collection Time    02/18/13  7:20 AM      Result Value Ref Range Status   Specimen Description URINE, CLEAN CATCH   Final   Special Requests NONE   Final   Culture  Setup Time     Final   Value: 02/18/2013 10:26     Performed at [place]     Final   Value: >=100,000 COLONIES/ML     Performed at [facility]   Culture     Final   Value: ESCHERICHIA COLI     Performed at [facility]   Report Status 02/22/2013 FINAL   Final   Organism ID, Bacteria ESCHERICHIA COLI   Final  CULTURE, BLOOD (ROUTINE X 2)     Status: None   Collection Time    02/18/13  8:15 AM      Result Value Ref Range Status   Specimen Description BLOOD SITE NOT SPECIFIED DRAWN BY RN   Final   Special Requests BOTTLES DRAWN AEROBIC AND ANAEROBIC 4CC   Final   Culture NO GROWTH 4 DAYS   Final   Report Status PENDING   Incomplete  CULTURE, BLOOD (ROUTINE X 2)     Status: None   Collection Time    02/18/13  8:30 AM      Result Value Ref Range Status   Specimen Description BLOOD RIGHT ARM   Final   Special Requests BOTTLES DRAWN AEROBIC ONLY [facility]   Final   Culture  Setup Time     Final   Value: 02/20/2013 01:21     Performed at [facility]   Culture     Final   Value: [place]     1822 Note: Gram Stain Report Called to,Read Back By and Verified With: Girard Cooter 02/19/2013 BAUGHAM Performed at [facility]     Performed at [facility]   Report Status 02/22/2013 FINAL   Final   Organism ID, Bacteria ESCHERICHIA COLI   Final  MRSA PCR SCREENING     Status: None   Collection Time    02/18/13 10:13 AM      Result Value Ref Range Status   MRSA by PCR NEGATIVE  NEGATIVE Final   Comment:            The GeneXpert MRSA Assay (FDA     approved for NASAL specimens     only), is one component of a     comprehensive MRSA colonization     surveillance program. It is not     intended to diagnose MRSA     infection nor to guide or     monitor treatment for     MRSA infections.  AFB CULTURE WITH SMEAR     Status: None   Collection Time    02/19/13 12:28 AM      Result Value Ref Range Status   Specimen Description ABSCESS ABDOMEN   Final   Special Requests PRE PERITONEAL   Final   ACID FAST SMEAR     Final   Value: NO ACID FAST BACILLI SEEN     Performed at [facility]   Culture     Final   Value: CULTURE WILL BE EXAMINED FOR 6 WEEKS BEFORE ISSUING A FINAL REPORT     Performed at [facility]   Report Status PENDING   Incomplete  ANAEROBIC CULTURE     Status: None   Collection Time    02/19/13 12:30 AM      Result Value Ref Range Status   Specimen Description ABSCESS ABDOMEN   Final   Special Requests PRE PERITONEAL   Final    Gram Stain     Final   Value: ABUNDANT WBC PRESENT,BOTH PMN AND MONONUCLEAR     NO SQUAMOUS EPITHELIAL CELLS SEEN     MODERATE GRAM NEGATIVE RODS     Performed at [facility]   Culture     Final   Value: NO ANAEROBES ISOLATED; CULTURE IN PROGRESS FOR 5 DAYS     Performed at [facility]   Report Status PENDING   Incomplete  CULTURE, ROUTINE-ABSCESS     Status: None   Collection Time    02/19/13 12:31 AM      Result Value Ref Range Status   Specimen Description ABSCESS ABDOMEN   Final   Special Requests PRE PERITONEAL   Final   Gram Stain     Final   Value: ABUNDANT WBC PRESENT,BOTH PMN AND MONONUCLEAR     NO SQUAMOUS EPITHELIAL CELLS SEEN     MODERATE GRAM NEGATIVE RODS     Performed at [facility]   Culture     Final   Value: MODERATE ESCHERICHIA COLI     Performed at [facility]   Report Status 02/21/2013 FINAL   Final   Organism ID, Bacteria ESCHERICHIA COLI   Final  FUNGUS CULTURE W SMEAR     Status: None   Collection Time    02/19/13 12:33 AM      Result Value Ref Range Status   Specimen Description ABSCESS ABDOMEN   Final   Special Requests PRE PERITONEAL   Final   Fungal Smear     Final   Value: NO YEAST OR FUNGAL ELEMENTS SEEN     Performed at [facility]   Culture     Final   Value: CULTURE IN PROGRESS FOR FOUR WEEKS     Performed at [facility]   Report Status PENDING   Incomplete  CULTURE, RESPIRATORY (NON-EXPECTORATED)     Status: None   Collection Time    02/19/13  5:38 AM      Result  Value Ref Range Status   Specimen Description TRACHEAL ASPIRATE   Final   Special Requests NONE   Final   Gram Stain     Final   Value: NO WBC SEEN     NO SQUAMOUS EPITHELIAL CELLS SEEN     NO ORGANISMS SEEN     Performed at [facility]   Culture     Final   Value: Non-Pathogenic Oropharyngeal-type Flora Isolated.     Performed at [facility]   Report Status 02/21/2013 FINAL   Final  CULTURE,  RESPIRATORY (NON-EXPECTORATED)     Status: None   Collection Time    02/19/13  9:53 AM      Result Value Ref Range Status   Specimen Description TRACHEAL ASPIRATE   Final   Special Requests NONE   Final   Gram Stain     Final   Value: FEW WBC PRESENT, PREDOMINANTLY PMN     RARE SQUAMOUS EPITHELIAL CELLS PRESENT     RARE GRAM POSITIVE COCCI     IN PAIRS     Performed at [facility]   Culture     Final   Value: Non-Pathogenic Oropharyngeal-type Flora Isolated.     Performed at [facility]   Report Status 02/21/2013 FINAL   Final    Anti-infectives   Start     Dose/Rate Route Frequency Ordered Stop   02/22/13 1200  vancomycin (VANCOCIN) IVPB 1000 mg/200 mL premix     1,000 mg 200 mL/hr over 60 Minutes Intravenous Every 12 hours 02/22/13 1111     02/19/13 2300  vancomycin (VANCOCIN) IVPB 750 mg/150 ml premix  Status:  Discontinued     750 mg 150 mL/hr over 60 Minutes Intravenous Every 12 hours 02/19/13 1436 02/20/13 0943   02/19/13 1500  meropenem (MERREM) 1 g in sodium chloride 0.9 % 100 mL IVPB     1 g 200 mL/hr over 30 Minutes Intravenous 3 times per day 02/19/13 1436     02/19/13 1000  vancomycin (VANCOCIN) IVPB 750 mg/150 ml premix  Status:  Discontinued     750 mg 150 mL/hr over 60 Minutes Intravenous Every 24 hours 02/18/13 1031 02/19/13 1437   02/18/13 1800  clindamycin (CLEOCIN) IVPB 900 mg  Status:  Discontinued     900 mg 100 mL/hr over 30 Minutes Intravenous 4 times per day 02/18/13 1626 02/20/13 0943   02/18/13 1800  meropenem (MERREM) 500 mg in sodium chloride 0.9 % 50 mL IVPB  Status:  Discontinued     500 mg 100 mL/hr over 30 Minutes Intravenous Every 12 hours 02/18/13 1649 02/19/13 1436   02/18/13 1200  piperacillin-tazobactam (ZOSYN) IVPB 2.25 g  Status:  Discontinued     2.25 g 100 mL/hr over 30 Minutes Intravenous 4 times per day 02/18/13 1026 02/18/13 1626   02/18/13 1100  vancomycin (VANCOCIN) 1,500 mg in sodium chloride 0.9 % 500 mL IVPB      1,500 mg 250 mL/hr over 120 Minutes Intravenous  Once 02/18/13 1026 02/18/13 1300   02/18/13 0800  cefTRIAXone (ROCEPHIN) 1 g in dextrose 5 % 50 mL IVPB     1 g Intravenous  Once 02/18/13 0752 02/18/13 0848      Assessment: 58 YOM transferred from [facility] after he pulled out his foley catheter after TURP procedure. CT scan of abdomen with evidence of soft tissue infection, +nectrotizing fascitis, also with extensive bilateral lower lobe consolidation. Vanc was started then stopped. Now restarted for  HCAP. Blood culture has grown out e.coli.  Plan for ID to see pt per MD's note  Goal of Therapy:  Vancomycin trough level 15-20 mcg/ml  Plan:   Cont Merrem 1g IV q8 Vanc 1g IV q12 Trough at steady state

## 2013-02-22 NOTE — Progress Notes (Signed)
4 Days Post-Op  Subjective: Ill appearing  Objective: Vital signs in last 24 hours: Temp:  [97.6 F (36.4 C)-98.7 F (37.1 C)] 98.2 F (36.8 C) (02/22 0414) Pulse Rate:  [54-96] 69 (02/22 0414) Resp:  [18-25] 20 (02/22 0414) BP: (120-163)/(57-73) 163/70 mmHg (02/22 0414) SpO2:  [80 %-99 %] 98 % (02/22 0943) Weight:  [157 lb 6.5 oz (71.4 kg)-162 lb 14.7 oz (73.9 kg)] 157 lb 6.5 oz (71.4 kg) (02/22 0500) Last BM Date: 02/21/13  Intake/Output from previous day: 02/21 0701 - 02/22 0700 In: 1776.5 [P.O.:120; I.V.:1356.5; IV Piggyback:300] Out: 2265 [Urine:1960; Drains:305] Intake/Output this shift:    Incision/Wound:penrose drains with serous fluid on dressing, jp with serous fluid, wound clean with some duskiness   Lab Results:   Recent Labs  02/21/13 0955 02/22/13 0805  WBC 26.7* 27.6*  HGB 10.9* 12.4*  HCT 31.8* 36.2*  PLT 238 266   BMET  Recent Labs  02/21/13 0312 02/22/13 0455  NA 150* 147  K 3.9 3.4*  CL 115* 109  CO2 24 27  GLUCOSE 136* 104*  BUN 40* 29*  CREATININE 1.09 0.89  CALCIUM 8.0* 8.1*   PT/INR No results found for this basename: LABPROT, INR,  in the last 72 hours ABG No results found for this basename: PHART, PCO2, PO2, HCO3,  in the last 72 hours  Studies/Results: Dg Chest Port 1 View  02/22/2013   CLINICAL DATA:  Basilar airspace disease and effusions  EXAM: PORTABLE CHEST - 1 VIEW  COMPARISON:  02/21/2013  FINDINGS: Normal heart size and vascularity. Persistent bibasilar atelectasis/consolidation noted with pleural effusions. Given changes in technique, no significant interval change. No pneumothorax. Trachea is midline.  IMPRESSION: Persistent bibasilar atelectasis/consolidation with associated pleural effusions.   Electronically Signed   By: Daryll Brod M.D.   On: 02/22/2013 08:05   Dg Chest Port 1 View  02/21/2013   CLINICAL DATA:  Shortness of breath.  Marland Kitchen  EXAM: PORTABLE CHEST - 1 VIEW  COMPARISON:  DG CHEST 1V PORT dated 02/21/2013   FINDINGS: Mediastinum and hilar structures are normal. Progressive atelectasis right lower lobe. Developing infiltrate left lower lobe. Bilateral pleural effusions. No pneumothorax. Heart size normal. No acute osseous abnormality.  IMPRESSION: 1.  Progressive atelectasis and/or infiltrate right lower lobe.  2.  Developing infiltrate left lower lobe.  3.  Bilateral small pleural effusions.   Electronically Signed   By: Marcello Moores  Register   On: 02/21/2013 23:39   Dg Chest Port 1 View  02/21/2013   CLINICAL DATA:  Pneumonia, extubated  EXAM: PORTABLE CHEST - 1 VIEW  COMPARISON:  02/20/2013  FINDINGS: Interval extubation. Right IJ central line and NG tube have also been removed. No pneumothorax. Slight worsening basilar airspace disease and left lower lobe consolidation/ collapse. Small effusions not excluded. Stable upper lobe aeration.  IMPRESSION: Interval extubation.  Slight worsening basilar airspace process and left lower lobe collapse/consolidation, compatible with pneumonia.   Electronically Signed   By: Daryll Brod M.D.   On: 02/21/2013 07:42    Anti-infectives: Anti-infectives   Start     Dose/Rate Route Frequency Ordered Stop   02/22/13 1200  vancomycin (VANCOCIN) IVPB 1000 mg/200 mL premix     1,000 mg 200 mL/hr over 60 Minutes Intravenous Every 12 hours 02/22/13 1111     02/19/13 2300  vancomycin (VANCOCIN) IVPB 750 mg/150 ml premix  Status:  Discontinued     750 mg 150 mL/hr over 60 Minutes Intravenous Every 12 hours 02/19/13 1436 02/20/13 0943  02/19/13 1500  meropenem (MERREM) 1 g in sodium chloride 0.9 % 100 mL IVPB     1 g 200 mL/hr over 30 Minutes Intravenous 3 times per day 02/19/13 1436     02/19/13 1000  vancomycin (VANCOCIN) IVPB 750 mg/150 ml premix  Status:  Discontinued     750 mg 150 mL/hr over 60 Minutes Intravenous Every 24 hours 02/18/13 1031 02/19/13 1437   02/18/13 1800  clindamycin (CLEOCIN) IVPB 900 mg  Status:  Discontinued     900 mg 100 mL/hr over 30 Minutes  Intravenous 4 times per day 02/18/13 1626 02/20/13 0943   02/18/13 1800  meropenem (MERREM) 500 mg in sodium chloride 0.9 % 50 mL IVPB  Status:  Discontinued     500 mg 100 mL/hr over 30 Minutes Intravenous Every 12 hours 02/18/13 1649 02/19/13 1436   02/18/13 1200  piperacillin-tazobactam (ZOSYN) IVPB 2.25 g  Status:  Discontinued     2.25 g 100 mL/hr over 30 Minutes Intravenous 4 times per day 02/18/13 1026 02/18/13 1626   02/18/13 1100  vancomycin (VANCOCIN) 1,500 mg in sodium chloride 0.9 % 500 mL IVPB     1,500 mg 250 mL/hr over 120 Minutes Intravenous  Once 02/18/13 1026 02/18/13 1300   02/18/13 0800  cefTRIAXone (ROCEPHIN) 1 g in dextrose 5 % 50 mL IVPB     1 g Intravenous  Once 02/18/13 0752 02/18/13 0848      Assessment/Plan: POD 4   Wbc still up, ? pna as source also Wound continue abx, don't think needs return to or.  If worsens can consider repeat ct Continue all drains Increase dressing change frequency, will need to monitor ab wound Needs nutrition  [facility] 02/22/2013

## 2013-02-22 NOTE — Progress Notes (Signed)
4 Days Post-Op Subjective: Patient reports no new complaints. He is tolerating his catheter well. He has been moved out of the ICU to the floor.  Objective: Vital signs in last 24 hours: Temp:  [97.6 F (36.4 C)-98.7 F (37.1 C)] 98.2 F (36.8 C) (02/22 0414) Pulse Rate:  [54-96] 69 (02/22 0414) Resp:  [18-25] 20 (02/22 0414) BP: (120-163)/(53-73) 163/70 mmHg (02/22 0414) SpO2:  [80 %-99 %] 97 % (02/22 0414) Weight:  [71.4 kg (157 lb 6.5 oz)-73.9 kg (162 lb 14.7 oz)] 71.4 kg (157 lb 6.5 oz) (02/22 0500)  Intake/Output from previous day: 02/21 0701 - 02/22 0700 In: 1776.5 [P.O.:120; I.V.:1356.5; IV Piggyback:300] Out: 2240 [Urine:1960; Drains:280] Intake/Output this shift: Total I/O In: 951 [I.V.:751; IV Piggyback:200] Out: 63 [Urine:850; Drains:40]  Physical Exam:  His abdominal wound is open with packing in place and has no signs of infection. His drain output is serous in nature. The Foley catheter is draining completely clear urine.  Lab Results:  Recent Labs  02/21/13 0312 02/21/13 0955  HGB 11.4* 10.9*  HCT 32.8* 31.8*   BMET  Recent Labs  02/21/13 0312 02/22/13 0455  NA 150* 147  K 3.9 3.4*  CL 115* 109  CO2 24 27  GLUCOSE 136* 104*  BUN 40* 29*  CREATININE 1.09 0.89  CALCIUM 8.0* 8.1*   No results found for this basename: LABPT, INR,  in the last 72 hours No results found for this basename: LABURIN,  in the last 72 hours Results for orders placed during the hospital encounter of 02/18/13  URINE CULTURE     Status: None   Collection Time    02/18/13  7:20 AM      Result Value Ref Range Status   Specimen Description URINE, CLEAN CATCH   Final   Special Requests NONE   Final   Culture  Setup Time     Final   Value: 02/18/2013 10:26     Performed at [place]     Final   Value: >=100,000 COLONIES/ML     Performed at [facility]   Culture     Final   Value: ESCHERICHIA COLI     Performed at [facility]   Report Status PENDING   Incomplete  CULTURE, BLOOD (ROUTINE X 2)     Status: None   Collection Time    02/18/13  8:15 AM      Result Value Ref Range Status   Specimen Description BLOOD SITE NOT SPECIFIED DRAWN BY RN   Final   Special Requests BOTTLES DRAWN AEROBIC AND ANAEROBIC 4CC   Final   Culture NO GROWTH 3 DAYS   Final   Report Status PENDING   Incomplete  CULTURE, BLOOD (ROUTINE X 2)     Status: None   Collection Time    02/18/13  8:30 AM      Result Value Ref Range Status   Specimen Description BLOOD RIGHT ARM   Final   Special Requests BOTTLES DRAWN AEROBIC ONLY [place]   Final   Culture  Setup Time     Final   Value: 02/20/2013 01:21     Performed at [facility]   Culture     Final   Value: ESCHERICHIA COLI        BLOOD CULTURE RECEIVED NO GROWTH TO DATE CULTURE WILL BE HELD FOR 5 DAYS BEFORE ISSUING A FINAL NEGATIVE REPORT     1822 Note: Gram Stain  Report Called to,Read Back By and Verified With: Girard Cooter 02/19/2013 [place] Performed at [facility]     Performed at [facility]   Report Status PENDING   Incomplete  MRSA PCR SCREENING     Status: None   Collection Time    02/18/13 10:13 AM      Result Value Ref Range Status   MRSA by PCR NEGATIVE  NEGATIVE Final   Comment:            The GeneXpert MRSA Assay (FDA     approved for NASAL specimens     only), is one component of a     comprehensive MRSA colonization     surveillance program. It is not     intended to diagnose MRSA     infection nor to guide or     monitor treatment for     MRSA infections.  AFB CULTURE WITH SMEAR     Status: None   Collection Time    02/19/13 12:28 AM      Result Value Ref Range Status   Specimen Description ABSCESS ABDOMEN   Final   Special Requests PRE PERITONEAL   Final   ACID FAST SMEAR     Final   Value: NO ACID FAST BACILLI SEEN     Performed at [facility]   Culture     Final   Value: CULTURE WILL BE EXAMINED FOR 6 WEEKS BEFORE  ISSUING A FINAL REPORT     Performed at [facility]   Report Status PENDING   Incomplete  ANAEROBIC CULTURE     Status: None   Collection Time    02/19/13 12:30 AM      Result Value Ref Range Status   Specimen Description ABSCESS ABDOMEN   Final   Special Requests PRE PERITONEAL   Final   Gram Stain     Final   Value: ABUNDANT WBC PRESENT,BOTH PMN AND MONONUCLEAR     NO SQUAMOUS EPITHELIAL CELLS SEEN     MODERATE GRAM NEGATIVE RODS     Performed at [facility]   Culture     Final   Value: NO ANAEROBES ISOLATED; CULTURE IN PROGRESS FOR 5 DAYS     Performed at [facility]   Report Status PENDING   Incomplete  CULTURE, ROUTINE-ABSCESS     Status: None   Collection Time    02/19/13 12:31 AM      Result Value Ref Range Status   Specimen Description ABSCESS ABDOMEN   Final   Special Requests PRE PERITONEAL   Final   Gram Stain     Final   Value: ABUNDANT WBC PRESENT,BOTH PMN AND MONONUCLEAR     NO SQUAMOUS EPITHELIAL CELLS SEEN     MODERATE GRAM NEGATIVE RODS     Performed at [facility]   Culture     Final   Value: MODERATE ESCHERICHIA COLI     Performed at [facility]   Report Status 02/21/2013 FINAL   Final   Organism ID, Bacteria ESCHERICHIA COLI   Final  FUNGUS CULTURE W SMEAR     Status: None   Collection Time    02/19/13 12:33 AM      Result Value Ref Range Status   Specimen Description ABSCESS ABDOMEN   Final   Special Requests PRE PERITONEAL   Final   Fungal Smear     Final   Value: NO YEAST OR FUNGAL ELEMENTS  SEEN     Performed at [facility]     Final   Value: CULTURE IN PROGRESS FOR FOUR WEEKS     Performed at [facility]   Report Status PENDING   Incomplete  CULTURE, RESPIRATORY (NON-EXPECTORATED)     Status: None   Collection Time    02/19/13  5:38 AM      Result Value Ref Range Status   Specimen Description TRACHEAL ASPIRATE   Final   Special Requests NONE   Final   Gram Stain      Final   Value: NO WBC SEEN     NO SQUAMOUS EPITHELIAL CELLS SEEN     NO ORGANISMS SEEN     Performed at [facility]   Culture     Final   Value: Non-Pathogenic Oropharyngeal-type Flora Isolated.     Performed at [facility]   Report Status 02/21/2013 FINAL   Final  CULTURE, RESPIRATORY (NON-EXPECTORATED)     Status: None   Collection Time    02/19/13  9:53 AM      Result Value Ref Range Status   Specimen Description TRACHEAL ASPIRATE   Final   Special Requests NONE   Final   Gram Stain     Final   Value: FEW WBC PRESENT, PREDOMINANTLY PMN     RARE SQUAMOUS EPITHELIAL CELLS PRESENT     RARE GRAM POSITIVE COCCI     IN PAIRS     Performed at [facility]   Culture     Final   Value: Non-Pathogenic Oropharyngeal-type Flora Isolated.     Performed at [facility]   Report Status 02/21/2013 FINAL   Final    Studies/Results: Dg Chest Port 1 View  02/21/2013   CLINICAL DATA:  Shortness of breath.  Marland Kitchen  EXAM: PORTABLE CHEST - 1 VIEW  COMPARISON:  DG CHEST 1V PORT dated 02/21/2013  FINDINGS: Mediastinum and hilar structures are normal. Progressive atelectasis right lower lobe. Developing infiltrate left lower lobe. Bilateral pleural effusions. No pneumothorax. Heart size normal. No acute osseous abnormality.  IMPRESSION: 1.  Progressive atelectasis and/or infiltrate right lower lobe.  2.  Developing infiltrate left lower lobe.  3.  Bilateral small pleural effusions.   Electronically Signed   By: Marcello Moores  Register   On: 02/21/2013 23:39   Dg Chest Port 1 View  02/21/2013   CLINICAL DATA:  Pneumonia, extubated  EXAM: PORTABLE CHEST - 1 VIEW  COMPARISON:  02/20/2013  FINDINGS: Interval extubation. Right IJ central line and NG tube have also been removed. No pneumothorax. Slight worsening basilar airspace disease and left lower lobe consolidation/ collapse. Small effusions not excluded. Stable upper lobe aeration.  IMPRESSION: Interval extubation.  Slight worsening  basilar airspace process and left lower lobe collapse/consolidation, compatible with pneumonia.   Electronically Signed   By: Daryll Brod M.D.   On: 02/21/2013 07:42    Assessment/Plan: He appears to be doing well clinically with no evidence of urine leak. He did have a slight increase in his drain output yesterday but that has diminished significantly over the last 8 hours. His urine culture grew Escherichia coli that was resistant to Cipro but sensitive to all other antibiotics tested.  Continue Foley catheter.  New urologic recommendations.   LOS: 4 days   Ikram Riebe C 02/22/2013, 6:45 AM

## 2013-02-22 NOTE — Progress Notes (Signed)
Pt instructed on use of Flutter. Pt demonstrated knowledge of using device, and performed flutter valve x5 with a non-productive, congested cough. RT will check back, and continue to monitor.

## 2013-02-23 ENCOUNTER — Inpatient Hospital Stay (HOSPITAL_COMMUNITY): Payer: Medicare (Managed Care)

## 2013-02-23 DIAGNOSIS — A4151 Sepsis due to Escherichia coli [E. coli]: Secondary | ICD-10-CM

## 2013-02-23 LAB — GLUCOSE, CAPILLARY
Glucose-Capillary: 104 mg/dL — ABNORMAL HIGH (ref 70–99)
Glucose-Capillary: 110 mg/dL — ABNORMAL HIGH (ref 70–99)
Glucose-Capillary: 131 mg/dL — ABNORMAL HIGH (ref 70–99)
Glucose-Capillary: 137 mg/dL — ABNORMAL HIGH (ref 70–99)
Glucose-Capillary: 144 mg/dL — ABNORMAL HIGH (ref 70–99)
Glucose-Capillary: 148 mg/dL — ABNORMAL HIGH (ref 70–99)

## 2013-02-23 LAB — CULTURE, BLOOD (ROUTINE X 2): CULTURE: NO GROWTH

## 2013-02-23 LAB — CBC
HCT: 33.5 % — ABNORMAL LOW (ref 39.0–52.0)
Hemoglobin: 11.7 g/dL — ABNORMAL LOW (ref 13.0–17.0)
MCH: 24.1 pg — AB (ref 26.0–34.0)
MCHC: 34.9 g/dL (ref 30.0–36.0)
MCV: 68.9 fL — ABNORMAL LOW (ref 78.0–100.0)
PLATELETS: 253 10*3/uL (ref 150–400)
RBC: 4.86 MIL/uL (ref 4.22–5.81)
RDW: 13 % (ref 11.5–15.5)
WBC: 27.6 10*3/uL — AB (ref 4.0–10.5)

## 2013-02-23 LAB — BASIC METABOLIC PANEL
BUN: 22 mg/dL (ref 6–23)
CALCIUM: 7.7 mg/dL — AB (ref 8.4–10.5)
CHLORIDE: 105 meq/L (ref 96–112)
CO2: 29 meq/L (ref 19–32)
Creatinine, Ser: 0.84 mg/dL (ref 0.50–1.35)
GFR calc Af Amer: >90 mL/min (ref >90)
GFR calc non Af Amer: 87 mL/min — ABNORMAL LOW (ref >90)
Glucose, Bld: 127 mg/dL — ABNORMAL HIGH (ref 70–99)
Potassium: 3.9 mEq/L (ref 3.7–5.3)
SODIUM: 143 meq/L (ref 137–147)

## 2013-02-23 MED ORDER — GLUCERNA SHAKE PO LIQD
237.0000 mL | Freq: Two times a day (BID) | ORAL | Status: DC
  Administered 2013-02-24 – 2013-02-27 (×7): 237 mL via ORAL

## 2013-02-23 MED ORDER — SODIUM CHLORIDE 0.9 % IV SOLN
3.0000 g | Freq: Four times a day (QID) | INTRAVENOUS | Status: DC
  Administered 2013-02-23 – 2013-02-27 (×17): 3 g via INTRAVENOUS
  Filled 2013-02-23 (×20): qty 3

## 2013-02-23 MED ORDER — ADULT MULTIVITAMIN W/MINERALS CH
1.0000 | ORAL_TABLET | Freq: Every day | ORAL | Status: DC
  Administered 2013-02-23 – 2013-03-02 (×8): 1 via ORAL
  Filled 2013-02-23 (×8): qty 1

## 2013-02-23 NOTE — Progress Notes (Signed)
Persistent elevated WBC concerning. If no significant improvement, consider repeat CT scan in next few days.  Would defer decision on that to Urology.  Will monitor abdominal wound.  May be ready for a VAC soon.  Imogene Burn. Georgette Dover, MD, [facility] Surgery  General/ Trauma Surgery  02/23/2013 1:30 PM

## 2013-02-23 NOTE — Progress Notes (Signed)
Patient ID: Jason [name], male   DOB: 10-02-1945, 68 y.o.   MRN: [id number]         [facility] for Infectious Disease    Date of Admission:  02/18/2013   Total days of antibiotics 6         Principal Problem:   Sepsis Active Problems:   ALCOHOL ABUSE   Memory loss   UTI (lower urinary tract infection)   Aspiration pneumonia   Acute respiratory failure with hypoxia   Acute renal failure   Dementia   Hematuria   Urothelial carcinoma   . albuterol  2.5 mg Nebulization BID  . antiseptic oral rinse  15 mL Mouth Rinse QID  . chlorhexidine  15 mL Mouth Rinse BID  . folic acid  1 mg Intravenous Daily  . heparin subcutaneous  5,000 Units Subcutaneous 3 times per day  . hydrocortisone sod succinate (SOLU-CORTEF) inj  25 mg Intravenous Q12H  . insulin aspart  0-9 Units Subcutaneous 6 times per day  . pantoprazole (PROTONIX) IV  40 mg Intravenous Q24H  . piperacillin-tazobactam (ZOSYN)  IV  3.375 g Intravenous Q8H  . sodium chloride  1,000 mL Intravenous Once  . sodium chloride  3 mL Intravenous Q12H  . thiamine  100 mg Intravenous Daily  . vancomycin  1,000 mg Intravenous Q12H    Past Medical History  Diagnosis Date  . Hypertension   . Stroke     Dementia, otherwise, no residual  . Bladder mass 02-10-13    surgery planned for this  . 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 COPD (chronic obstructive pulmonary disease)     previous history and current smoking  . 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 Dementia 02/18/2013    History  Substance Use Topics  . Smoking status: Current Every Day Smoker  . Smokeless tobacco: Not on file  . Alcohol Use: Yes    No family history on file.  No Known Allergies  Objective: Temp:  [97.5 F (36.4 C)-98.7 F (37.1 C)] 98.2 F (36.8 C) (02/23 0410) Pulse Rate:  [64-97] 69 (02/23 0414) Resp:  [18-20] 18 (02/23 0414) BP: (129-152)/(66-69) 152/66 mmHg (02/23 0410) SpO2:  [93 %-96 %] 95 % (02/23  0414) Weight:  [71.4 kg (157 lb 6.5 oz)] 71.4 kg (157 lb 6.5 oz) (02/23 0410)  General: He opens eyes to voice Lungs: Diffuse rhonchi Cor: Regular S1-S2 no murmurs Abdomen: Thin bloody fluid in JP drain. Incisions covered  Lab Results Lab Results  Component Value Date   WBC 27.6* 02/23/2013   HGB 11.7* 02/23/2013   HCT 33.5* 02/23/2013   MCV 68.9* 02/23/2013   PLT 253 02/23/2013    Lab Results  Component Value Date   CREATININE 0.84 02/23/2013   BUN 22 02/23/2013   NA 143 02/23/2013   K 3.9 02/23/2013   CL 105 02/23/2013   CO2 29 02/23/2013    Lab Results  Component Value Date   ALT 42 02/19/2013   AST 43* 02/19/2013   ALKPHOS 105 02/19/2013   BILITOT 1.5* 02/19/2013      Microbiology: Recent Results (from the past 240 hour(s))  URINE CULTURE     Status: None   Collection Time    02/18/13  7:20 AM      Result Value Ref Range Status   Specimen Description URINE, CLEAN CATCH   Final   Special Requests NONE   Final   Culture  Setup Time  Final   Value: 02/18/2013 10:26     Performed at [place]     Final   Value: >=100,000 COLONIES/ML     Performed at [facility]   Culture     Final   Value: ESCHERICHIA COLI     Performed at [facility]   Report Status 02/22/2013 FINAL   Final   Organism ID, Bacteria ESCHERICHIA COLI   Final  CULTURE, BLOOD (ROUTINE X 2)     Status: None   Collection Time    02/18/13  8:15 AM      Result Value Ref Range Status   Specimen Description BLOOD SITE NOT SPECIFIED DRAWN BY RN   Final   Special Requests BOTTLES DRAWN AEROBIC AND ANAEROBIC 4CC   Final   Culture NO GROWTH 5 DAYS   Final   Report Status 02/23/2013 FINAL   Final  CULTURE, BLOOD (ROUTINE X 2)     Status: None   Collection Time    02/18/13  8:30 AM      Result Value Ref Range Status   Specimen Description BLOOD RIGHT ARM   Final   Special Requests BOTTLES DRAWN AEROBIC ONLY [facility]   Final   Culture  Setup Time     Final   Value:  02/20/2013 01:21     Performed at [facility]   Culture     Final   Value: ESCHERICHIA COLI     1822 Note: Gram Stain Report Called to,Read Back By and Verified With: Girard Cooter 02/19/2013 BAUGHAM Performed at [facility]     Performed at [facility]   Report Status 02/22/2013 FINAL   Final   Organism ID, Bacteria ESCHERICHIA COLI   Final  MRSA PCR SCREENING     Status: None   Collection Time    02/18/13 10:13 AM      Result Value Ref Range Status   MRSA by PCR NEGATIVE  NEGATIVE Final   Comment:            The GeneXpert MRSA Assay (FDA     approved for NASAL specimens     only), is one component of a     comprehensive MRSA colonization     surveillance program. It is not     intended to diagnose MRSA     infection nor to guide or     monitor treatment for     MRSA infections.  AFB CULTURE WITH SMEAR     Status: None   Collection Time    02/19/13 12:28 AM      Result Value Ref Range Status   Specimen Description ABSCESS ABDOMEN   Final   Special Requests PRE PERITONEAL   Final   ACID FAST SMEAR     Final   Value: NO ACID FAST BACILLI SEEN     Performed at [facility]   Culture     Final   Value: CULTURE WILL BE EXAMINED FOR 6 WEEKS BEFORE ISSUING A FINAL REPORT     Performed at [facility]   Report Status PENDING   Incomplete  ANAEROBIC CULTURE     Status: None   Collection Time    02/19/13 12:30 AM      Result Value Ref Range Status   Specimen Description ABSCESS ABDOMEN   Final   Special Requests PRE PERITONEAL   Final   Gram Stain     Final  Value: ABUNDANT WBC PRESENT,BOTH PMN AND MONONUCLEAR     NO SQUAMOUS EPITHELIAL CELLS SEEN     MODERATE GRAM NEGATIVE RODS     Performed at [facility]   Culture     Final   Value: NO ANAEROBES ISOLATED; CULTURE IN PROGRESS FOR 5 DAYS     Performed at [facility]   Report Status PENDING   Incomplete  CULTURE, ROUTINE-ABSCESS     Status: None   Collection Time     02/19/13 12:31 AM      Result Value Ref Range Status   Specimen Description ABSCESS ABDOMEN   Final   Special Requests PRE PERITONEAL   Final   Gram Stain     Final   Value: ABUNDANT WBC PRESENT,BOTH PMN AND MONONUCLEAR     NO SQUAMOUS EPITHELIAL CELLS SEEN     MODERATE GRAM NEGATIVE RODS     Performed at [facility]   Culture     Final   Value: MODERATE ESCHERICHIA COLI     Performed at [facility]   Report Status 02/21/2013 FINAL   Final   Organism ID, Bacteria ESCHERICHIA COLI   Final  FUNGUS CULTURE W SMEAR     Status: None   Collection Time    02/19/13 12:33 AM      Result Value Ref Range Status   Specimen Description ABSCESS ABDOMEN   Final   Special Requests PRE PERITONEAL   Final   Fungal Smear     Final   Value: NO YEAST OR FUNGAL ELEMENTS SEEN     Performed at [facility]   Culture     Final   Value: CULTURE IN PROGRESS FOR FOUR WEEKS     Performed at [facility]   Report Status PENDING   Incomplete  CULTURE, RESPIRATORY (NON-EXPECTORATED)     Status: None   Collection Time    02/19/13  5:38 AM      Result Value Ref Range Status   Specimen Description TRACHEAL ASPIRATE   Final   Special Requests NONE   Final   Gram Stain     Final   Value: NO WBC SEEN     NO SQUAMOUS EPITHELIAL CELLS SEEN     NO ORGANISMS SEEN     Performed at [facility]   Culture     Final   Value: Non-Pathogenic Oropharyngeal-type Flora Isolated.     Performed at [facility]   Report Status 02/21/2013 FINAL   Final  CULTURE, RESPIRATORY (NON-EXPECTORATED)     Status: None   Collection Time    02/19/13  9:53 AM      Result Value Ref Range Status   Specimen Description TRACHEAL ASPIRATE   Final   Special Requests NONE   Final   Gram Stain     Final   Value: FEW WBC PRESENT, PREDOMINANTLY PMN     RARE SQUAMOUS EPITHELIAL CELLS PRESENT     RARE GRAM POSITIVE COCCI     IN PAIRS     Performed at [facility]   Culture      Final   Value: Non-Pathogenic Oropharyngeal-type Flora Isolated.     Performed at [facility]   Report Status 02/21/2013 FINAL   Final    Studies/Results: Dg Chest 2 View  02/23/2013   CLINICAL DATA:  68 year old male with cough, congestion and pneumonia.  EXAM: CHEST  2 VIEW  COMPARISON:  02/22/2013  FINDINGS:  Cardiomediastinal silhouette is stable.  Bilateral lower lung atelectasis/ airspace disease/ consolidation noted with probable bilateral pleural effusions.  There is no evidence of pneumothorax, pulmonary edema or acute bony abnormality.  IMPRESSION: Unchanged bilateral lower lung atelectasis/ airspace disease/ consolidation with probable bilateral pleural effusions.   Electronically Signed   By: Hassan Rowan M.D.   On: 02/23/2013 01:45   Dg Chest Port 1 View  02/22/2013   CLINICAL DATA:  Basilar airspace disease and effusions  EXAM: PORTABLE CHEST - 1 VIEW  COMPARISON:  02/21/2013  FINDINGS: Normal heart size and vascularity. Persistent bibasilar atelectasis/consolidation noted with pleural effusions. Given changes in technique, no significant interval change. No pneumothorax. Trachea is midline.  IMPRESSION: Persistent bibasilar atelectasis/consolidation with associated pleural effusions.   Electronically Signed   By: Daryll Brod M.D.   On: 02/22/2013 08:05   Dg Chest Port 1 View  02/21/2013   CLINICAL DATA:  Shortness of breath.  Marland Kitchen  EXAM: PORTABLE CHEST - 1 VIEW  COMPARISON:  DG CHEST 1V PORT dated 02/21/2013  FINDINGS: Mediastinum and hilar structures are normal. Progressive atelectasis right lower lobe. Developing infiltrate left lower lobe. Bilateral pleural effusions. No pneumothorax. Heart size normal. No acute osseous abnormality.  IMPRESSION: 1.  Progressive atelectasis and/or infiltrate right lower lobe.  2.  Developing infiltrate left lower lobe.  3.  Bilateral small pleural effusions.   Electronically Signed   By: Marcello Moores  Register   On: 02/21/2013 23:39   Dg Abd 2  Views  02/22/2013   CLINICAL DATA:  Abdominal pain  EXAM: ABDOMEN - 2 VIEW  COMPARISON:  CT ABD/PELV WO CM dated 02/18/2013  FINDINGS: There is no evidence of free intraperitoneal gas. Mild distention of small and large bowel loops in and ileus pattern are noted. Surgical drain projects over the pelvis. Multiple Penrose drains project over the pelvis.  IMPRESSION: No free intraperitoneal gas.  Ileus.  Drains project over the pelvis.   Electronically Signed   By: Maryclare Bean M.D.   On: 02/22/2013 13:11   Dg Swallowing Func-speech Pathology  02/23/2013   Katherene Ponto Deblois, CCC-SLP     02/23/2013 10:49 AM Objective Swallowing Evaluation: Modified Barium Swallowing Study   Patient Details  Name: Jason [name] MRN: [id number] Date of Birth: 04-23-1945  Today's Date: 02/23/2013 Time: [id number] SLP Time Calculation (min): 20 min  Past Medical History:  Past Medical History  Diagnosis Date  . Hypertension   . Stroke     Dementia, otherwise, no residual  . Bladder mass 02-10-13    surgery planned for this  . 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 COPD (chronic obstructive pulmonary disease)     previous history and current smoking  . 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 Dementia 02/18/2013   Past Surgical History:  Past Surgical History  Procedure Laterality Date  . Transurethral resection of bladder tumor with gyrus  (turbt-gyrus) N/A 02/13/2013    Procedure: TRANSURETHRAL RESECTION OF BLADDER TUMOR WITH GYRUS  (TURBT-GYRUS), bladder biopsies;  Surgeon: Ardis Hughs,  MD;  Location: [facility];  Service: Urology;  Laterality: N/A;  . Cystoscopy/retrograde/ureteroscopy Bilateral 02/13/2013    Procedure: BILATERAL RETROGRADE;  Surgeon: Ardis Hughs,  MD;  Location: [facility];  Service: Urology;  Laterality: Bilateral;   . Laparotomy N/A 02/18/2013    Procedure: EXPLORATORY LAPAROTOMY drainage of retroperitoneal  abscess, drainage of Pre-Peritoneal abscess and Exploration of  bladder perforation;   Surgeon: Imogene Burn. Georgette Dover, MD;  Location:  [facility];  Service: General;  Laterality: N/A;   HPI:  Severe Sepsis with possible necrotizing fasciitis of the rectus  muscle and left internal obturator , also suspected HCAP -  currently hemodynamically stable, status post exploratory  laparotomy and drainage by general surgery and urology on  02/18/2013, status post intubation and extubation was extubated  on 02/20/2013.      Assessment / Plan / Recommendation Clinical Impression  Clinical impression: Pt demonstrates moderate sensory impairment  impacting safety with large straw sips or with pills given with  liquids. Pts oral phase is characterized by slow pumping and  prolonged mastication of solids, poor posterior containment with  straw sips and struggle to transit pill. Pharyngeal phase is  strong, but there is decreased coordination and timing with straw  sips allowing for silent aspiration during/after the swallow. The  pt is recommended to consume regular solids and cup sips (may be  moderate to large without severe risk), but should not be given  straws and pills must be given whole in puree as there is  significant aspiration of liquids with pills. Fully upright  posture also very important. Recommend OOB if possible. SLP will  follow for tolerance.     Treatment Recommendation  Therapy as outlined in treatment plan below    Diet Recommendation Regular;Thin liquid   Liquid Administration via: No straw;Cup Medication Administration: Whole meds with puree Supervision: Staff to assist with self feeding;Full  supervision/cueing for compensatory strategies Compensations: Slow rate Postural Changes and/or Swallow Maneuvers: Seated upright 90  degrees;Out of bed for meals    Other  Recommendations Oral Care Recommendations: Oral care BID Other Recommendations: Have oral suction available   Follow Up Recommendations  Skilled Nursing facility    Frequency and Duration min 2x/week  2 weeks   Pertinent Vitals/Pain NA    SLP  Swallow Goals     General HPI: Severe Sepsis with possible necrotizing fasciitis of  the rectus muscle and left internal obturator , also suspected  HCAP - currently hemodynamically stable, status post exploratory  laparotomy and drainage by general surgery and urology on  02/18/2013, status post intubation and extubation was extubated  on 02/20/2013.  Type of Study: Modified Barium Swallowing Study Reason for Referral: Objectively evaluate swallowing function Diet Prior to this Study: Dysphagia 3 (soft);Nectar-thick liquids Temperature Spikes Noted: No Respiratory Status: Nasal cannula History of Recent Intubation: Yes Length of Intubations (days): 2 days Date extubated: 02/20/13 Behavior/Cognition: Alert;Cooperative;Confused;Decreased  sustained attention Oral Cavity - Dentition: Poor condition;Missing dentition Oral Motor / Sensory Function: Within functional limits Self-Feeding Abilities: Able to feed self Patient Positioning: Upright in chair Baseline Vocal Quality: Hoarse Volitional Cough: Other (Comment);Weak (requires max cues) Volitional Swallow:  (NT) Anatomy: Within functional limits Pharyngeal Secretions:  (probable standing secretions based on  vocal quality)    Reason for Referral Objectively evaluate swallowing function   Oral Phase Oral Preparation/Oral Phase Oral Phase: Impaired Oral - Thin Oral - Thin Cup: Lingual/palatal residue Oral - Thin Straw: Lingual/palatal residue;Piecemeal swallowing Oral - Solids Oral - Puree: Lingual/palatal residue;Delayed oral  transit;Reduced posterior propulsion;Lingual pumping Oral - Regular: Reduced posterior propulsion;Delayed oral transit Oral - Pill: Other (Comment) (dramatic posterior head tilt needed  to transit pills)   Pharyngeal Phase Pharyngeal Phase Pharyngeal Phase: Impaired Pharyngeal - Thin Pharyngeal - Thin Cup: Premature spillage to pyriform  sinuses;Pharyngeal residue - valleculae;Pharyngeal residue -  pyriform sinuses (independent second swallow to  clear) Pharyngeal - Thin Straw: Premature spillage to pyriform  sinuses;Penetration/Aspiration  after swallow;Moderate aspiration Penetration/Aspiration details (thin straw): Material enters  airway, passes BELOW cords without attempt by patient to eject  out (silent aspiration);Material does not enter airway Pharyngeal - Solids Pharyngeal - Puree: Delayed swallow initiation;Premature spillage  to valleculae Pharyngeal - Regular: Delayed swallow initiation;Premature  spillage to valleculae Pharyngeal - Pill: Delayed swallow  initiation;Penetration/Aspiration before swallow;Moderate  aspiration Penetration/Aspiration details (pill): Material enters airway,  passes BELOW cords without attempt by patient to eject out  (silent aspiration)  Cervical Esophageal Phase    GO    Cervical Esophageal Phase Cervical Esophageal Phase: [facility]         DeBlois, Katherene Ponto 02/23/2013, 10:48 AM     Assessment: He has grown Escherichia coli from his abdominal wounds, urine and blood. I will narrow antibiotic therapy to IV ampicillin and sulbactam.  Plan: 1. Change vancomycin and piperacillin tazobactam to IV ampicillin sulbactam  [name], MD [facility] [place] [phone] pager   [phone] cell 02/23/2013, 10:58 AM

## 2013-02-23 NOTE — Progress Notes (Addendum)
RT came to do CPT and neb tx. Pt absent from floor

## 2013-02-23 NOTE — Procedures (Signed)
Objective Swallowing Evaluation: Modified Barium Swallowing Study  Patient Details  Name: Jason [name] MRN: [id number] Date of Birth: 01-26-1945  Today's Date: 02/23/2013 Time: [id number] SLP Time Calculation (min): 20 min  Past Medical History:  Past Medical History  Diagnosis Date  . Hypertension   . Stroke     Dementia, otherwise, no residual  . Bladder mass 02-10-13    surgery planned for this  . 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 COPD (chronic obstructive pulmonary disease)     previous history and current smoking  . 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 Dementia 02/18/2013   Past Surgical History:  Past Surgical History  Procedure Laterality Date  . Transurethral resection of bladder tumor with gyrus (turbt-gyrus) N/A 02/13/2013    Procedure: TRANSURETHRAL RESECTION OF BLADDER TUMOR WITH GYRUS (TURBT-GYRUS), bladder biopsies;  Surgeon: Ardis Hughs, MD;  Location: [facility];  Service: Urology;  Laterality: N/A;  . Cystoscopy/retrograde/ureteroscopy Bilateral 02/13/2013    Procedure: BILATERAL RETROGRADE;  Surgeon: Ardis Hughs, MD;  Location: [facility];  Service: Urology;  Laterality: Bilateral;  . Laparotomy N/A 02/18/2013    Procedure: EXPLORATORY LAPAROTOMY drainage of retroperitoneal abscess, drainage of Pre-Peritoneal abscess and Exploration of bladder perforation;  Surgeon: Imogene Burn. Tsuei, MD;  Location: [facility];  Service: General;  Laterality: N/A;   HPI:  Severe Sepsis with possible necrotizing fasciitis of the rectus muscle and left internal obturator , also suspected HCAP - currently hemodynamically stable, status post exploratory laparotomy and drainage by general surgery and urology on 02/18/2013, status post intubation and extubation was extubated on 02/20/2013.      Assessment / Plan / Recommendation Clinical Impression  Clinical impression: Pt demonstrates moderate sensory impairment impacting safety with large straw sips or with  pills given with liquids. Pts oral phase is characterized by slow pumping and prolonged mastication of solids, poor posterior containment with straw sips and struggle to transit pill. Pharyngeal phase is strong, but there is decreased coordination and timing with straw sips allowing for silent aspiration during/after the swallow. The pt is recommended to consume regular solids and cup sips (may be moderate to large without severe risk), but should not be given straws and pills must be given whole in puree as there is significant aspiration of liquids with pills. Fully upright posture also very important. Recommend OOB if possible. SLP will follow for tolerance.     Treatment Recommendation  Therapy as outlined in treatment plan below    Diet Recommendation Regular;Thin liquid   Liquid Administration via: No straw;Cup Medication Administration: Whole meds with puree Supervision: Staff to assist with self feeding;Full supervision/cueing for compensatory strategies Compensations: Slow rate Postural Changes and/or Swallow Maneuvers: Seated upright 90 degrees;Out of bed for meals    Other  Recommendations Oral Care Recommendations: Oral care BID Other Recommendations: Have oral suction available   Follow Up Recommendations  Skilled Nursing facility    Frequency and Duration min 2x/week  2 weeks   Pertinent Vitals/Pain NA    SLP Swallow Goals     General HPI: Severe Sepsis with possible necrotizing fasciitis of the rectus muscle and left internal obturator , also suspected HCAP - currently hemodynamically stable, status post exploratory laparotomy and drainage by general surgery and urology on 02/18/2013, status post intubation and extubation was extubated on 02/20/2013.  Type of Study: Modified Barium Swallowing Study Reason for Referral: Objectively evaluate swallowing function Diet Prior to this Study: Dysphagia 3 (soft);Nectar-thick liquids Temperature Spikes Noted: No  Respiratory  Status: Nasal cannula History of Recent Intubation: Yes Length of Intubations (days): 2 days Date extubated: 02/20/13 Behavior/Cognition: Alert;Cooperative;Confused;Decreased sustained attention Oral Cavity - Dentition: Poor condition;Missing dentition Oral Motor / Sensory Function: Within functional limits Self-Feeding Abilities: Able to feed self Patient Positioning: Upright in chair Baseline Vocal Quality: Hoarse Volitional Cough: Other (Comment);Weak (requires max cues) Volitional Swallow:  (NT) Anatomy: Within functional limits Pharyngeal Secretions:  (probable standing secretions based on vocal quality)    Reason for Referral Objectively evaluate swallowing function   Oral Phase Oral Preparation/Oral Phase Oral Phase: Impaired Oral - Thin Oral - Thin Cup: Lingual/palatal residue Oral - Thin Straw: Lingual/palatal residue;Piecemeal swallowing Oral - Solids Oral - Puree: Lingual/palatal residue;Delayed oral transit;Reduced posterior propulsion;Lingual pumping Oral - Regular: Reduced posterior propulsion;Delayed oral transit Oral - Pill: Other (Comment) (dramatic posterior head tilt needed to transit pills)   Pharyngeal Phase Pharyngeal Phase Pharyngeal Phase: Impaired Pharyngeal - Thin Pharyngeal - Thin Cup: Premature spillage to pyriform sinuses;Pharyngeal residue - valleculae;Pharyngeal residue - pyriform sinuses (independent second swallow to clear) Pharyngeal - Thin Straw: Premature spillage to pyriform sinuses;Penetration/Aspiration after swallow;Moderate aspiration Penetration/Aspiration details (thin straw): Material enters airway, passes BELOW cords without attempt by patient to eject out (silent aspiration);Material does not enter airway Pharyngeal - Solids Pharyngeal - Puree: Delayed swallow initiation;Premature spillage to valleculae Pharyngeal - Regular: Delayed swallow initiation;Premature spillage to valleculae Pharyngeal - Pill: Delayed swallow  initiation;Penetration/Aspiration before swallow;Moderate aspiration Penetration/Aspiration details (pill): Material enters airway, passes BELOW cords without attempt by patient to eject out (silent aspiration)  Cervical Esophageal Phase    GO    Cervical Esophageal Phase Cervical Esophageal Phase: [facility]         Virdia Ziesmer, Katherene Ponto 02/23/2013, 10:48 AM

## 2013-02-23 NOTE — Progress Notes (Signed)
Patient Demographics  Jason [name], is a 68 y.o. male, DOB - [birth year], FY:[id number]  Admit date - 02/18/2013   Admitting Physician Juanito Doom, MD  Outpatient Primary MD for the patient is [name], MD  LOS - 5   Chief Complaint  Patient presents with  . Pulled Foley out        BRIEF PATIENT DESCRIPTION: 68 yo AAF s/p TURP procedure, presented to [facility] ED after pulling out his foley catheter.  He was seen by urology for hematuria, found to have a lobulated mass in bladder and and underwent cystoscopy, TURBT and was discharged home with Foley on 2/13 from  [facility].  This admission his initial lab work revealed ARF, UTI, suspected aspiration pneumonia, and sepsis. He was febrile and hypoxic, with labs and symptoms of sepsis. CT scan ab/pelvis discussed w/ evidence of soft tissue infection in the pelvis with extension to the fascia of the rectus muscles and of the left internal obturator muscle consistent with necrotizing fasciitis. No visible pus collections in the pelvis as well as extensive bilateral lower lobe pneumonia. Blood pressures became soft, but pt responded well to 3L IVF.   He was admitted to pulmonary critical care, required intubation and mechanical ventilation from 02/18/2013 to 02/20/2013, he went to the OR and was operated upon by urology and general surgery, on 02/21/2013 he was transferred to the floor under the care of hospitalist by pulmonary critical care.   [facility] has assumed his care on 02/22/2013   Assessment & Plan    1.Severe Sepsis with possible necrotizing fasciitis of the rectus muscle and left internal obturator currently hemodynamically stable sepsis resolved. Status post exploratory laparotomy and drainage by general surgery and  urology on 02/18/2013, status post intubation and extubation was extubated on 02/20/2013.  We will continue monitoring JP drain and Foley catheter output. Urology and general surgery following.   2.  E-coli Bacteremia 1 of 2 blood cultures drawn 2/18 positive for e-coli. Still has significant leukocytosis, his antibiotics are being managed by infectious disease and have been narrowed to Unasyn as of February 23.    3.HCAP - Was initially treated with Vancomycin.  This has since been narrowed to Unasyn by Infectious Disease.   Bronch evidence of HCAP with report suggesting possible aspiration.   Speech evaluation completed.  Regular diet with thin liquids recommended.   Pulmonary toiletry, chest PT, Oxygen,  nebulizer treatments as needed.   4. Acute renal failure. Due to severe sepsis. Resolved with IV fluids, continue gentle IV fluids and monitor BMP.   5. Hypokalemia.  Resolved after supplementation.  Will continue to monitor intermittently.    6. Dementia. Will remain at risk for delirium. Supportive care, avoid benzos.    7. History of alcohol abuse. Counseled to quit alcohol. Foley catheter thiamine and multivitamins. No signs of DTs.    8. History of  Hematuria and Urothelial carcinoma - follows with urology at [facility], post discharge he will follow with them.     Code Status: Full  Family Communication: family at bedside.  Disposition Plan: SNF when appropriate   Procedures    2/18 CT abd/pelvis w/o contrast: Evidence consistent with anaerobic soft tissue infection in the pelvis with extension to  the fascia of the rectus muscles and of the left internal obturator muscle consistent with necrotizing fasciitis. No visible pus collections in the pelvis. Extensive bilateral lower lobe pneumonia.  2/18 US Renal: No hydronephrosis.  2/18 CXR: Bibasilar opacities question pneumonia versus aspiration. Potential postoperative ileus. Bubbly gas extending farther left laterally  in the inferior pelvis than is typically seen with stool, question prominent stool versus extraperitoneal extraluminal gas in patient who has had a recent cystoscopy and TURBT  2/18 Exploratory laparotomy with drainage of retroperitoneal and preperitoneal soft tissue infection  2/19 CXR Endotracheal tube appears in good position. New right lower lobe atelectasis. Interval placement NG tube with side port is just above GE  Junction.  2/19 Bronchoscopy,Sent purulent fluid  2/20 extubated     LINES / TUBES:  2/18 Foley Cath>>  2/18 PIV x1>>  2/18 RIJ>>>2/20  2/19 OETT>>2/20     CULTURES:  2/18 blood culture>> gram neg rods>>>  2/18 urine culture>>E. Coli>>>  2/18 sputum culture: neg, final pending  2/19 wound cultures >> gram neg rods>>>E coli (pansens)  2/19 fungus culture >> negative  2/19 Broch: rare gram positive cocci in pairs final pending     Consults Urology,CCS, PCCM, ID cord.    Medications  Scheduled Meds: . albuterol  2.5 mg Nebulization BID  . ampicillin-sulbactam (UNASYN) IV  3 g Intravenous Q6H  . antiseptic oral rinse  15 mL Mouth Rinse QID  . chlorhexidine  15 mL Mouth Rinse BID  . feeding supplement (GLUCERNA SHAKE)  237 mL Oral BID BM  . folic acid  1 mg Intravenous Daily  . heparin subcutaneous  5,000 Units Subcutaneous 3 times per day  . hydrocortisone sod succinate (SOLU-CORTEF) inj  25 mg Intravenous Q12H  . insulin aspart  0-9 Units Subcutaneous 6 times per day  . multivitamin with minerals  1 tablet Oral Daily  . pantoprazole (PROTONIX) IV  40 mg Intravenous Q24H  . sodium chloride  1,000 mL Intravenous Once  . sodium chloride  3 mL Intravenous Q12H  . thiamine  100 mg Intravenous Daily   Continuous Infusions: . sodium chloride Stopped (02/20/13 1600)  . dextrose 60 mL/hr at 02/22/13 1128  . dextrose 5 % with KCl 20 mEq / L 20 mEq (02/23/13 0842)  . fentaNYL infusion INTRAVENOUS Stopped (02/20/13 0746)  . norepinephrine (LEVOPHED) Adult  infusion    . phenylephrine (NEO-SYNEPHRINE) Adult infusion     PRN Meds:.acetaminophen, acetaminophen, fentaNYL, fentaNYL, food thickener, metoprolol, ondansetron (ZOFRAN) IV, ondansetron  DVT Prophylaxis    Heparin   Lab Results  Component Value Date   PLT 253 02/23/2013    Antibiotics     Anti-infectives   Start     Dose/Rate Route Frequency Ordered Stop   02/23/13 1200  Ampicillin-Sulbactam (UNASYN) 3 g in sodium chloride 0.9 % 100 mL IVPB     3 g 100 mL/hr over 60 Minutes Intravenous Every 6 hours 02/23/13 1109     02/22/13 1600  piperacillin-tazobactam (ZOSYN) IVPB 3.375 g  Status:  Discontinued     3.375 g 12.5 mL/hr over 240 Minutes Intravenous Every 8 hours 02/22/13 1516 02/23/13 1109   02/22/13 1200  vancomycin (VANCOCIN) IVPB 1000 mg/200 mL premix  Status:  Discontinued     1,000 mg 200 mL/hr over 60 Minutes Intravenous Every 12 hours 02/22/13 1111 02/23/13 1109   02/19/13 2300  vancomycin (VANCOCIN) IVPB 750 mg/150 ml premix  Status:  Discontinued     750 mg 150 mL/hr over 60  Minutes Intravenous Every 12 hours 02/19/13 1436 02/20/13 0943   02/19/13 1500  meropenem (MERREM) 1 g in sodium chloride 0.9 % 100 mL IVPB  Status:  Discontinued     1 g 200 mL/hr over 30 Minutes Intravenous 3 times per day 02/19/13 1436 02/22/13 1455   02/19/13 1000  vancomycin (VANCOCIN) IVPB 750 mg/150 ml premix  Status:  Discontinued     750 mg 150 mL/hr over 60 Minutes Intravenous Every 24 hours 02/18/13 1031 02/19/13 1437   02/18/13 1800  clindamycin (CLEOCIN) IVPB 900 mg  Status:  Discontinued     900 mg 100 mL/hr over 30 Minutes Intravenous 4 times per day 02/18/13 1626 02/20/13 0943   02/18/13 1800  meropenem (MERREM) 500 mg in sodium chloride 0.9 % 50 mL IVPB  Status:  Discontinued     500 mg 100 mL/hr over 30 Minutes Intravenous Every 12 hours 02/18/13 1649 02/19/13 1436   02/18/13 1200  piperacillin-tazobactam (ZOSYN) IVPB 2.25 g  Status:  Discontinued     2.25 g 100 mL/hr over  30 Minutes Intravenous 4 times per day 02/18/13 1026 02/18/13 1626   02/18/13 1100  vancomycin (VANCOCIN) 1,500 mg in sodium chloride 0.9 % 500 mL IVPB     1,500 mg 250 mL/hr over 120 Minutes Intravenous  Once 02/18/13 1026 02/18/13 1300   02/18/13 0800  cefTRIAXone (ROCEPHIN) 1 g in dextrose 5 % 50 mL IVPB     1 g Intravenous  Once 02/18/13 0752 02/18/13 0848          Subjective:   Jason [name] today has, No headache, No chest pain, No abdominal pain - No Nausea, No new weakness tingling or numbness, No Cough - SOB.   Objective:   Filed Vitals:   02/23/13 0149 02/23/13 0410 02/23/13 0414 02/23/13 1344  BP:  152/66  144/72  Pulse: 66 64 69 66  Temp:  98.2 F (36.8 C)  98 F (36.7 C)  TempSrc:  Oral  Oral  Resp: 18 18 18 18   Height:      Weight:  71.4 kg (157 lb 6.5 oz)    SpO2: 95% 96% 95% 99%    Wt Readings from Last 3 Encounters:  02/23/13 71.4 kg (157 lb 6.5 oz)  02/23/13 71.4 kg (157 lb 6.5 oz)  02/10/13 67.586 kg (149 lb)     Intake/Output Summary (Last 24 hours) at 02/23/13 1623 Last data filed at 02/23/13 1307  Gross per 24 hour  Intake   2065 ml  Output   1435 ml  Net    630 ml     Physical Exam  Awake Alert, Oriented X 3, No new F.N deficits, Normal affect, pleasant.  Eating breakfast. .AT,PERRAL Supple Neck,No JVD, No cervical lymphadenopathy appriciated.  Symmetrical Chest wall movement, Good air movement bilaterally, Coarse Bilat B sounds RRR,No Gallops,Rubs or new Murmurs, No Parasternal Heave +ve B.Sounds, Abd Soft, Non tender, No organomegaly appriciated, No rebound . JP drain and foley in place No Cyanosis, Clubbing or edema, No new Rash or bruise      Data Review   Micro Results Recent Results (from the past 240 hour(s))  URINE CULTURE     Status: None   Collection Time    02/18/13  7:20 AM      Result Value Ref Range Status   Specimen Description URINE, CLEAN CATCH   Final   Special Requests NONE   Final   Culture  Setup Time  Final   Value: 02/18/2013 10:26     Performed at [place]     Final   Value: >=100,000 COLONIES/ML     Performed at [facility]   Culture     Final   Value: ESCHERICHIA COLI     Performed at [facility]   Report Status 02/22/2013 FINAL   Final   Organism ID, Bacteria ESCHERICHIA COLI   Final  CULTURE, BLOOD (ROUTINE X 2)     Status: None   Collection Time    02/18/13  8:15 AM      Result Value Ref Range Status   Specimen Description BLOOD SITE NOT SPECIFIED DRAWN BY RN   Final   Special Requests BOTTLES DRAWN AEROBIC AND ANAEROBIC 4CC   Final   Culture NO GROWTH 5 DAYS   Final   Report Status 02/23/2013 FINAL   Final  CULTURE, BLOOD (ROUTINE X 2)     Status: None   Collection Time    02/18/13  8:30 AM      Result Value Ref Range Status   Specimen Description BLOOD RIGHT ARM   Final   Special Requests BOTTLES DRAWN AEROBIC ONLY [facility]   Final   Culture  Setup Time     Final   Value: 02/20/2013 01:21     Performed at [facility]   Culture     Final   Value: ESCHERICHIA COLI     1822 Note: Gram Stain Report Called to,Read Back By and Verified With: Girard Cooter 02/19/2013 BAUGHAM Performed at [facility]     Performed at [facility]   Report Status 02/22/2013 FINAL   Final   Organism ID, Bacteria ESCHERICHIA COLI   Final  MRSA PCR SCREENING     Status: None   Collection Time    02/18/13 10:13 AM      Result Value Ref Range Status   MRSA by PCR NEGATIVE  NEGATIVE Final   Comment:            The GeneXpert MRSA Assay (FDA     approved for NASAL specimens     only), is one component of a     comprehensive MRSA colonization     surveillance program. It is not     intended to diagnose MRSA     infection nor to guide or     monitor treatment for     MRSA infections.  AFB CULTURE WITH SMEAR     Status: None   Collection Time    02/19/13 12:28 AM      Result Value Ref Range Status   Specimen Description  ABSCESS ABDOMEN   Final   Special Requests PRE PERITONEAL   Final   ACID FAST SMEAR     Final   Value: NO ACID FAST BACILLI SEEN     Performed at [facility]   Culture     Final   Value: CULTURE WILL BE EXAMINED FOR 6 WEEKS BEFORE ISSUING A FINAL REPORT     Performed at [facility]   Report Status PENDING   Incomplete  ANAEROBIC CULTURE     Status: None   Collection Time    02/19/13 12:30 AM      Result Value Ref Range Status   Specimen Description ABSCESS ABDOMEN   Final   Special Requests PRE PERITONEAL   Final   Gram Stain     Final  Value: ABUNDANT WBC PRESENT,BOTH PMN AND MONONUCLEAR     NO SQUAMOUS EPITHELIAL CELLS SEEN     MODERATE GRAM NEGATIVE RODS     Performed at [facility]   Culture     Final   Value: NO ANAEROBES ISOLATED; CULTURE IN PROGRESS FOR 5 DAYS     Performed at [facility]   Report Status PENDING   Incomplete  CULTURE, ROUTINE-ABSCESS     Status: None   Collection Time    02/19/13 12:31 AM      Result Value Ref Range Status   Specimen Description ABSCESS ABDOMEN   Final   Special Requests PRE PERITONEAL   Final   Gram Stain     Final   Value: ABUNDANT WBC PRESENT,BOTH PMN AND MONONUCLEAR     NO SQUAMOUS EPITHELIAL CELLS SEEN     MODERATE GRAM NEGATIVE RODS     Performed at [facility]   Culture     Final   Value: MODERATE ESCHERICHIA COLI     Performed at [facility]   Report Status 02/21/2013 FINAL   Final   Organism ID, Bacteria ESCHERICHIA COLI   Final  FUNGUS CULTURE W SMEAR     Status: None   Collection Time    02/19/13 12:33 AM      Result Value Ref Range Status   Specimen Description ABSCESS ABDOMEN   Final   Special Requests PRE PERITONEAL   Final   Fungal Smear     Final   Value: NO YEAST OR FUNGAL ELEMENTS SEEN     Performed at [facility]   Culture     Final   Value: CULTURE IN PROGRESS FOR FOUR WEEKS     Performed at [facility]   Report Status PENDING    Incomplete  CULTURE, RESPIRATORY (NON-EXPECTORATED)     Status: None   Collection Time    02/19/13  5:38 AM      Result Value Ref Range Status   Specimen Description TRACHEAL ASPIRATE   Final   Special Requests NONE   Final   Gram Stain     Final   Value: NO WBC SEEN     NO SQUAMOUS EPITHELIAL CELLS SEEN     NO ORGANISMS SEEN     Performed at [facility]   Culture     Final   Value: Non-Pathogenic Oropharyngeal-type Flora Isolated.     Performed at [facility]   Report Status 02/21/2013 FINAL   Final  CULTURE, RESPIRATORY (NON-EXPECTORATED)     Status: None   Collection Time    02/19/13  9:53 AM      Result Value Ref Range Status   Specimen Description TRACHEAL ASPIRATE   Final   Special Requests NONE   Final   Gram Stain     Final   Value: FEW WBC PRESENT, PREDOMINANTLY PMN     RARE SQUAMOUS EPITHELIAL CELLS PRESENT     RARE GRAM POSITIVE COCCI     IN PAIRS     Performed at [facility]   Culture     Final   Value: Non-Pathogenic Oropharyngeal-type Flora Isolated.     Performed at [facility]   Report Status 02/21/2013 FINAL   Final    Radiology Reports Ct Abdomen Pelvis Wo Contrast  02/18/2013   CLINICAL DATA:  Fever. Recent transurethral resection of bladder tumor. Extraluminal gas collection in the left side of the pelvis.  EXAM: CT ABDOMEN AND PELVIS WITHOUT CONTRAST  TECHNIQUE: Multidetector CT imaging of the abdomen and pelvis was performed following the standard protocol without intravenous contrast.  COMPARISON:  Radiograph dated 02/18/2013 and CT scan dated 08/30/2006  FINDINGS: There are bilateral extensive lower lobe consolidative pulmonary infiltrates. No effusions. Heart size is normal. Tiny hiatal hernia.  There is extensive extraluminal air in the soft tissues of the pelvis including the presacral space and along the left side of the pelvis along the fascia of the left antral operator muscle as well as extending anteriorly along  the deep fascia of the rectus muscles. This is consistent with and anaerobic infection and possible necrotizing fasciitis.  The liver, biliary tree, spleen, pancreas, adrenal glands, and kidneys demonstrate no significant abnormality. The bowel appears normal. Foley catheter is in place in the bladder with a small amount of air in the bladder.  IMPRESSION: 1. Evidence consistent with anaerobic soft tissue infection in the pelvis with extension to the fascia of the rectus muscles and of the left internal obturator muscle consistent with necrotizing fasciitis. No visible pus collections in the pelvis. 2. Extensive bilateral lower lobe pneumonia.  Critical Value/emergent results were called by telephone at the time of interpretation on 02/18/2013 at 3:55 PM to Dr. Murray Hodgkins , who verbally acknowledged these results.   Electronically Signed   By: Rozetta Nunnery M.D.   On: 02/18/2013 15:56     US Renal  02/18/2013   CLINICAL DATA:  Acute renal failure  EXAM: RENAL/URINARY TRACT ULTRASOUND COMPLETE  COMPARISON:  01/13/2013  FINDINGS: Right Kidney:  Length: 11.2 cm.  No mass or hydronephrosis.  Left Kidney:  Length: 11.0 cm.  No mass or hydronephrosis.  Bladder:  Poorly visualized/decompressed by indwelling Foley catheter.  IMPRESSION: No hydronephrosis.  Bladder poorly visualized/decompressed by indwelling Foley catheter.   Electronically Signed   By: Julian Hy M.D.   On: 02/18/2013 13:36      Dg Abd 2 Views  02/18/2013   CLINICAL DATA:  Bladder tumor, urothelial carcinoma, possible vomiting, post TURBT 02/13/2013  EXAM: ABDOMEN - 2 VIEW  COMPARISON:  None  FINDINGS: Bibasilar infiltrates question pneumonia versus aspiration.  Air-filled nondistended small bowel loops the mid abdomen, question postoperative ileus.  Bubbly gas identified in the pelvis, could be related to stool within the distal colon but extending farther left lateral than is typically seen with rectal vault stool, raising question of  extraperitoneal gas in the left pelvis.  Bones unremarkable.  No urinary tract calcification.  No definite free intraperitoneal air.  IMPRESSION: Bibasilar opacities question pneumonia versus aspiration.  Potential postoperative ileus.  Bubbly gas extending farther left laterally in the inferior pelvis than is typically seen with stool, question prominent stool versus extraperitoneal extraluminal gas in patient who has had a recent cystoscopy and TURBT ; CT imaging of the abdomen and pelvis with IV and oral contrast recommended.  Findings called to Dr. Sarajane Jews on 02/18/2013 at 1315 hr.   Electronically Signed   By: Lavonia Dana M.D.   On: 02/18/2013 13:16    CBC  Recent Labs Lab 02/18/13 0647 02/21/13 0312 02/21/13 0955 02/22/13 0805 02/23/13 0640  WBC 14.4* 29.2* 26.7* 27.6* 27.6*  HGB 13.9 11.4* 10.9* 12.4* 11.7*  HCT 39.9 32.8* 31.8* 36.2* 33.5*  PLT 141* 227 238 266 253  MCV 68.7* 69.2* 70.2* 69.9* 68.9*  MCH 23.9* 24.1* 24.1* 23.9* 24.1*  MCHC 34.8 34.8 34.3 34.3 34.9  RDW 14.2 13.5 13.9 13.3 13.0  LYMPHSABS 0.9  --   --   --   --   MONOABS 0.7  --   --   --   --   EOSABS 0.0  --   --   --   --   BASOSABS 0.0  --   --   --   --     Chemistries   Recent Labs Lab 02/18/13 2032 02/19/13 0500 02/19/13 1230 02/19/13 2032 02/20/13 0025 02/20/13 0432 02/21/13 0312 02/22/13 0455 02/23/13 0640  NA 144 143 145 149*  --  147 150* 147 143  K 3.8 4.5 4.4 4.7  --  4.3 3.9 3.4* 3.9  CL 112 109 113* 116*  --  114* 115* 109 105  CO2 20 19 21 21   --  22 24 27 29   GLUCOSE 109* 103* 122* 152*  --  187* 136* 104* 127*  BUN 63* 51* 42* 39*  --  39* 40* 29* 22  CREATININE 1.41* 1.13 1.02 1.03  --  1.02 1.09 0.89 0.84  CALCIUM 7.2* 7.5* 7.9* 7.8*  --  8.0* 8.0* 8.1* 7.7*  MG  --  2.5 2.5  --  2.5  --   --   --   --   AST  --  43*  --   --   --   --   --   --   --   ALT  --  42  --   --   --   --   --   --   --   ALKPHOS  --  105  --   --   --   --   --   --   --   BILITOT  --  1.5*   --   --   --   --   --   --   --     Coagulation profile  Recent Labs Lab 02/18/13 2032  INR 1.19     Time Spent in minutes   35   York, Meleni Delahunt L PA-C on 02/23/2013 at 4:23 PM  Between 7am to 7pm - Pager - [phone]  After 7pm go to [email or web address] - password [email or web address]  And look for the night coverage person covering for me after hours  [facility] Office  [phone]

## 2013-02-23 NOTE — Progress Notes (Signed)
NUTRITION FOLLOW-UP  INTERVENTION: Discontinue enteral nutrition formula and previous orders - pt without enteral access and now on diet at this time. Add Glucerna Shake po BID, each supplement provides 220 kcal and 10 grams of protein. Add MVI daily. RD to continue to follow nutrition care plan.  NUTRITION DIAGNOSIS: Inadequate oral intake now related to variable appetite AEB limited oral intake.  Goal: Intake to meet >90% of estimated nutrition needs.  Monitor:  weight trend, labs, PO intake, supplement tolerance  ASSESSMENT: Patient is a 68 yo AAF s/p TURP procedure, presented to [facility] ED after pulling out his foley catheter. Initial lab work revealed ARF, UTI, suspected aspiration pneumonia, and sepsis. CT scan abd/pelvis consistent w/ anaerobic soft tissue infection in the pelvis with extension to the fascia of the rectus muscles and of the left internal obturator muscle consistent with necrotizing fasciitis.  Exploration of bladder perforation and I&D with insertion of drain for necrotizing fascitis on 2/19. Bronchoscopy 2/20. Extubated 2/20. BSE completed by SLP on 2/21 with recommendations for Dysphagia 3 diet with Nectar-Thickened liquids, including full supervision with all meals. MBSS this morning recommending Regular diet with thin liquids, which pt has just been ordered for.  Discussed pt during progression rounds. Pt feeding self during breakfast. Pt would benefit from oral nutrition supplements given skin breakdown.   Height: Ht Readings from Last 1 Encounters:  02/21/13 6' (1.829 m)    Weight: Wt Readings from Last 1 Encounters:  02/23/13 157 lb 6.5 oz (71.4 kg)  Admit wt 165 lb   BMI:  Body mass index is 21.34 kg/(m^2).  Estimated Nutritional Needs: Kcal: 1850 - 2000 Protein: 100-115 gm Fluid: 2 L  Skin: abdominal incision; sDTI of sacral wound  Diet Order: General   Intake/Output Summary (Last 24 hours) at 02/23/13 1103 Last data filed at  02/23/13 0842  Gross per 24 hour  Intake   2145 ml  Output    935 ml  Net   1210 ml    Last BM: 2/23  Labs:   Recent Labs Lab 02/19/13 0500 02/19/13 1230  02/20/13 0025  02/21/13 0312 02/22/13 0455 02/23/13 0640  NA 143 145  < >  --   < > 150* 147 143  K 4.5 4.4  < >  --   < > 3.9 3.4* 3.9  CL 109 113*  < >  --   < > 115* 109 105  CO2 19 21  < >  --   < > 24 27 29   BUN 51* 42*  < >  --   < > 40* 29* 22  CREATININE 1.13 1.02  < >  --   < > 1.09 0.89 0.84  CALCIUM 7.5* 7.9*  < >  --   < > 8.0* 8.1* 7.7*  MG 2.5 2.5  --  2.5  --   --   --   --   PHOS 2.7 2.3  --  2.7  --   --   --   --   GLUCOSE 103* 122*  < >  --   < > 136* 104* 127*  < > = values in this interval not displayed.  CBG (last 3)   Recent Labs  02/23/13 0006 02/23/13 0407 02/23/13 0805  GLUCAP 148* 110* 137*    Scheduled Meds: . albuterol  2.5 mg Nebulization BID  . antiseptic oral rinse  15 mL Mouth Rinse QID  . chlorhexidine  15 mL Mouth Rinse BID  .  folic acid  1 mg Intravenous Daily  . heparin subcutaneous  5,000 Units Subcutaneous 3 times per day  . hydrocortisone sod succinate (SOLU-CORTEF) inj  25 mg Intravenous Q12H  . insulin aspart  0-9 Units Subcutaneous 6 times per day  . pantoprazole (PROTONIX) IV  40 mg Intravenous Q24H  . piperacillin-tazobactam (ZOSYN)  IV  3.375 g Intravenous Q8H  . sodium chloride  1,000 mL Intravenous Once  . sodium chloride  3 mL Intravenous Q12H  . thiamine  100 mg Intravenous Daily  . vancomycin  1,000 mg Intravenous Q12H    Continuous Infusions: . sodium chloride Stopped (02/20/13 1600)  . dextrose 60 mL/hr at 02/22/13 1128  . dextrose 5 % with KCl 20 mEq / L 20 mEq (02/23/13 0842)  . fentaNYL infusion INTRAVENOUS Stopped (02/20/13 0746)  . norepinephrine (LEVOPHED) Adult infusion    . phenylephrine (NEO-SYNEPHRINE) Adult infusion      Inda Coke MS, RD, LDN Inpatient Registered Dietitian Pager: [phone] After-hours pager: [phone]

## 2013-02-23 NOTE — Progress Notes (Signed)
Patient ID: Jason [name], male   DOB: [birth year], 68 y.o.   MRN: [id number] 5 Days Post-Op  Subjective: Pt states he feels well.  Going down for swallow study.  No other complaints  Objective: Vital signs in last 24 hours: Temp:  [97.5 F (36.4 C)-98.7 F (37.1 C)] 98.2 F (36.8 C) (02/23 0410) Pulse Rate:  [64-97] 69 (02/23 0414) Resp:  [18-20] 18 (02/23 0414) BP: (129-152)/(66-69) 152/66 mmHg (02/23 0410) SpO2:  [93 %-96 %] 95 % (02/23 0414) Weight:  [157 lb 6.5 oz (71.4 kg)] 157 lb 6.5 oz (71.4 kg) (02/23 0410) Last BM Date: 02/23/13  Intake/Output from previous day: 02/22 0701 - 02/23 0700 In: 2095 [P.O.:120; I.V.:1375; IV Piggyback:600] Out: 985 [Urine:475; Drains:510] Intake/Output this shift: Total I/O In: 74 [IV Piggyback:50] Out: -   PE: Abd: soft, bilateral penrose drains in place with some serous drainage around these, JP with serous drainage.  Midline wound is dusky over muscle.  NT  Lab Results:   Recent Labs  02/22/13 0805 02/23/13 0640  WBC 27.6* 27.6*  HGB 12.4* 11.7*  HCT 36.2* 33.5*  PLT 266 253   BMET  Recent Labs  02/22/13 0455 02/23/13 0640  NA 147 143  K 3.4* 3.9  CL 109 105  CO2 27 29  GLUCOSE 104* 127*  BUN 29* 22  CREATININE 0.89 0.84  CALCIUM 8.1* 7.7*   PT/INR No results found for this basename: LABPROT, INR,  in the last 72 hours CMP     Component Value Date/Time   NA 143 02/23/2013 0640   K 3.9 02/23/2013 0640   CL 105 02/23/2013 0640   CO2 29 02/23/2013 0640   GLUCOSE 127* 02/23/2013 0640   BUN 22 02/23/2013 0640   CREATININE 0.84 02/23/2013 0640   CALCIUM 7.7* 02/23/2013 0640   PROT 5.1* 02/19/2013 0500   ALBUMIN 1.8* 02/19/2013 0500   AST 43* 02/19/2013 0500   ALT 42 02/19/2013 0500   ALKPHOS 105 02/19/2013 0500   BILITOT 1.5* 02/19/2013 0500   GFRNONAA 87* 02/23/2013 0640   GFRAA >90 02/23/2013 0640   Lipase  No results found for this basename: lipase       Studies/Results: Dg Chest 2 View  02/23/2013   CLINICAL  DATA:  68 year old male with cough, congestion and pneumonia.  EXAM: CHEST  2 VIEW  COMPARISON:  02/22/2013  FINDINGS: Cardiomediastinal silhouette is stable.  Bilateral lower lung atelectasis/ airspace disease/ consolidation noted with probable bilateral pleural effusions.  There is no evidence of pneumothorax, pulmonary edema or acute bony abnormality.  IMPRESSION: Unchanged bilateral lower lung atelectasis/ airspace disease/ consolidation with probable bilateral pleural effusions.   Electronically Signed   By: Hassan Rowan M.D.   On: 02/23/2013 01:45   Dg Chest Port 1 View  02/22/2013   CLINICAL DATA:  Basilar airspace disease and effusions  EXAM: PORTABLE CHEST - 1 VIEW  COMPARISON:  02/21/2013  FINDINGS: Normal heart size and vascularity. Persistent bibasilar atelectasis/consolidation noted with pleural effusions. Given changes in technique, no significant interval change. No pneumothorax. Trachea is midline.  IMPRESSION: Persistent bibasilar atelectasis/consolidation with associated pleural effusions.   Electronically Signed   By: Daryll Brod M.D.   On: 02/22/2013 08:05   Dg Chest Port 1 View  02/21/2013   CLINICAL DATA:  Shortness of breath.  Marland Kitchen  EXAM: PORTABLE CHEST - 1 VIEW  COMPARISON:  DG CHEST 1V PORT dated 02/21/2013  FINDINGS: Mediastinum and hilar structures are normal. Progressive atelectasis right lower lobe. Developing  infiltrate left lower lobe. Bilateral pleural effusions. No pneumothorax. Heart size normal. No acute osseous abnormality.  IMPRESSION: 1.  Progressive atelectasis and/or infiltrate right lower lobe.  2.  Developing infiltrate left lower lobe.  3.  Bilateral small pleural effusions.   Electronically Signed   By: Marcello Moores  Register   On: 02/21/2013 23:39   Dg Abd 2 Views  02/22/2013   CLINICAL DATA:  Abdominal pain  EXAM: ABDOMEN - 2 VIEW  COMPARISON:  CT ABD/PELV WO CM dated 02/18/2013  FINDINGS: There is no evidence of free intraperitoneal gas. Mild distention of small and large  bowel loops in and ileus pattern are noted. Surgical drain projects over the pelvis. Multiple Penrose drains project over the pelvis.  IMPRESSION: No free intraperitoneal gas.  Ileus.  Drains project over the pelvis.   Electronically Signed   By: Maryclare Bean M.D.   On: 02/22/2013 13:11    Anti-infectives: Anti-infectives   Start     Dose/Rate Route Frequency Ordered Stop   02/22/13 1600  piperacillin-tazobactam (ZOSYN) IVPB 3.375 g     3.375 g 12.5 mL/hr over 240 Minutes Intravenous Every 8 hours 02/22/13 1516     02/22/13 1200  vancomycin (VANCOCIN) IVPB 1000 mg/200 mL premix     1,000 mg 200 mL/hr over 60 Minutes Intravenous Every 12 hours 02/22/13 1111     02/19/13 2300  vancomycin (VANCOCIN) IVPB 750 mg/150 ml premix  Status:  Discontinued     750 mg 150 mL/hr over 60 Minutes Intravenous Every 12 hours 02/19/13 1436 02/20/13 0943   02/19/13 1500  meropenem (MERREM) 1 g in sodium chloride 0.9 % 100 mL IVPB  Status:  Discontinued     1 g 200 mL/hr over 30 Minutes Intravenous 3 times per day 02/19/13 1436 02/22/13 1455   02/19/13 1000  vancomycin (VANCOCIN) IVPB 750 mg/150 ml premix  Status:  Discontinued     750 mg 150 mL/hr over 60 Minutes Intravenous Every 24 hours 02/18/13 1031 02/19/13 1437   02/18/13 1800  clindamycin (CLEOCIN) IVPB 900 mg  Status:  Discontinued     900 mg 100 mL/hr over 30 Minutes Intravenous 4 times per day 02/18/13 1626 02/20/13 0943   02/18/13 1800  meropenem (MERREM) 500 mg in sodium chloride 0.9 % 50 mL IVPB  Status:  Discontinued     500 mg 100 mL/hr over 30 Minutes Intravenous Every 12 hours 02/18/13 1649 02/19/13 1436   02/18/13 1200  piperacillin-tazobactam (ZOSYN) IVPB 2.25 g  Status:  Discontinued     2.25 g 100 mL/hr over 30 Minutes Intravenous 4 times per day 02/18/13 1026 02/18/13 1626   02/18/13 1100  vancomycin (VANCOCIN) 1,500 mg in sodium chloride 0.9 % 500 mL IVPB     1,500 mg 250 mL/hr over 120 Minutes Intravenous  Once 02/18/13 1026 02/18/13  1300   02/18/13 0800  cefTRIAXone (ROCEPHIN) 1 g in dextrose 5 % 50 mL IVPB     1 g Intravenous  Once 02/18/13 0752 02/18/13 0848       Assessment/Plan  1. POD 5, s/p ex lap with drainage of RTP and preperitoneal soft tissue infection  2. UTI, E. Coli 3. PNA 4. Sepsis, improving  Plan: 1. Follow midline wound closely. 2. Patient has been on dysphagia diet, but apparently is having signs of aspiration.  Swallow study currently being performed. 3. Cont all drains, will defer removal and further urologic plans to urology.   LOS: 5 days    Elnora Quizon E 02/23/2013,  10:30 AM Pager: [phone]

## 2013-02-23 NOTE — Progress Notes (Signed)
Pt states he is tired- pt gave weak effort doing flutter cpt

## 2013-02-24 LAB — BASIC METABOLIC PANEL
BUN: 16 mg/dL (ref 6–23)
CO2: 32 mEq/L (ref 19–32)
CREATININE: 0.89 mg/dL (ref 0.50–1.35)
Calcium: 7.8 mg/dL — ABNORMAL LOW (ref 8.4–10.5)
Chloride: 101 mEq/L (ref 96–112)
GFR, EST NON AFRICAN AMERICAN: 85 mL/min — AB (ref >90)
GLUCOSE: 92 mg/dL (ref 70–99)
Potassium: 3.3 mEq/L — ABNORMAL LOW (ref 3.7–5.3)
Sodium: 141 mEq/L (ref 137–147)

## 2013-02-24 LAB — CBC
HEMATOCRIT: 36.3 % — AB (ref 39.0–52.0)
HEMOGLOBIN: 12.3 g/dL — AB (ref 13.0–17.0)
MCH: 23.8 pg — AB (ref 26.0–34.0)
MCHC: 33.9 g/dL (ref 30.0–36.0)
MCV: 70.2 fL — AB (ref 78.0–100.0)
Platelets: 289 10*3/uL (ref 150–400)
RBC: 5.17 MIL/uL (ref 4.22–5.81)
RDW: 13.4 % (ref 11.5–15.5)
WBC: 21.9 10*3/uL — ABNORMAL HIGH (ref 4.0–10.5)

## 2013-02-24 LAB — GLUCOSE, CAPILLARY
Glucose-Capillary: 126 mg/dL — ABNORMAL HIGH (ref 70–99)
Glucose-Capillary: 143 mg/dL — ABNORMAL HIGH (ref 70–99)
Glucose-Capillary: 147 mg/dL — ABNORMAL HIGH (ref 70–99)
Glucose-Capillary: 148 mg/dL — ABNORMAL HIGH (ref 70–99)
Glucose-Capillary: 153 mg/dL — ABNORMAL HIGH (ref 70–99)
Glucose-Capillary: 173 mg/dL — ABNORMAL HIGH (ref 70–99)
Glucose-Capillary: 77 mg/dL (ref 70–99)
Glucose-Capillary: 85 mg/dL (ref 70–99)

## 2013-02-24 LAB — ANAEROBIC CULTURE

## 2013-02-24 MED ORDER — PANTOPRAZOLE SODIUM 40 MG PO TBEC
40.0000 mg | DELAYED_RELEASE_TABLET | Freq: Every day | ORAL | Status: DC
  Administered 2013-02-24 – 2013-03-02 (×7): 40 mg via ORAL
  Filled 2013-02-24 (×6): qty 1

## 2013-02-24 MED ORDER — FOLIC ACID 1 MG PO TABS
1.0000 mg | ORAL_TABLET | Freq: Every day | ORAL | Status: DC
  Administered 2013-02-24 – 2013-03-02 (×7): 1 mg via ORAL
  Filled 2013-02-24 (×7): qty 1

## 2013-02-24 MED ORDER — POTASSIUM CHLORIDE CRYS ER 20 MEQ PO TBCR
40.0000 meq | EXTENDED_RELEASE_TABLET | Freq: Once | ORAL | Status: AC
  Administered 2013-02-24: 40 meq via ORAL
  Filled 2013-02-24 (×2): qty 2

## 2013-02-24 MED ORDER — VITAMIN B-1 100 MG PO TABS
100.0000 mg | ORAL_TABLET | Freq: Every day | ORAL | Status: DC
  Administered 2013-02-24 – 2013-03-02 (×7): 100 mg via ORAL
  Filled 2013-02-24 (×7): qty 1

## 2013-02-24 NOTE — Progress Notes (Signed)
  I have directly reviewed the clinical findings, lab, imaging studies and management of this patient in detail. I have interviewed and examined the patient and agree with the documentation,  as recorded by the Physician extender.  Thurnell Lose M.D on 02/24/2013 at 3:21 PM  [facility] Office  [phone]

## 2013-02-24 NOTE — Progress Notes (Signed)
Patient Demographics  Jason [name], is a 68 y.o. male, DOB - 04/04/1945, MHD:[id number]  Admit date - 02/18/2013   Admitting Physician Juanito Doom, MD  Outpatient Primary MD for the patient is [name], MD  LOS - 6   Chief Complaint  Patient presents with  . Pulled Foley out        BRIEF PATIENT DESCRIPTION: 68 yo AAF s/p TURP procedure, presented to [place] ED after pulling out his foley catheter.  He was seen by urology for hematuria, found to have a lobulated mass in bladder and and underwent cystoscopy, TURBT and was discharged home with Foley on 2/13 from  [facility]  This admission his initial lab work revealed ARF, UTI, suspected aspiration pneumonia, and sepsis. He was febrile and hypoxic, with labs and symptoms of sepsis. CT scan ab/pelvis discussed w/ evidence of soft tissue infection in the pelvis with extension to the fascia of the rectus muscles and of the left internal obturator muscle consistent with necrotizing fasciitis. No visible pus collections in the pelvis as well as extensive bilateral lower lobe pneumonia. Blood pressures became soft, but pt responded well to 3L IVF.   He was admitted to pulmonary critical care, required intubation and mechanical ventilation from 02/18/2013 to 02/20/2013, he went to the OR and was operated upon by urology and general surgery, on 02/21/2013 he was transferred to the floor under the care of hospitalist by pulmonary critical care.   [facility] has assumed his care on 02/22/2013   Assessment & Plan    Severe Sepsis with possible necrotizing fasciitis of the rectus muscle and left internal obturator  currently hemodynamically stable sepsis resolved.  Status post exploratory laparotomy and drainage by general surgery  and urology on 02/18/2013,   status post intubation and extubation was extubated on 02/20/2013.    JP drain was removed on 2/24 by general surgery  Penrose drains are being managed by Urology  Patient receiving Wet to Dry dressing changes.  Leave foley catheter in place for at least 2 weeks post op per Urology.  E-coli Bacteremia  1 of 2 blood cultures drawn 2/18 positive for e-coli.  Antibiotics are being managed by infectious disease and have been narrowed to Unasyn as of February 23.   Per ID he will need several more weeks of IV antibiotic therapy.  Repeat blood cultures ordered 2/24.  If these are negative we will order PICC line for extended antibiotic therapy.   HCAP / Aspiration Pneumonia   Was initially treated with Vancomycin.    This has since been narrowed to Unasyn by Infectious Disease.     Bronch evidence of HCAP with report suggesting possible aspiration.     Speech evaluation completed.  Regular diet with thin liquids recommended.    Pulmonary toiletry, chest PT, Oxygen,  nebulizer treatments as needed.  Much improved.   Acute renal failure.   Due to severe sepsis.  Resolved with IV fluids,   Hypokalemia.    Resolved after supplementation.    Will continue to monitor intermittently.  Dementia.   Will remain at risk for delirium.   Supportive care, avoid benzos.  History of alcohol abuse.   Counseled to quit alcohol.   Folate, thiamine and multivitamins.  No signs of DTs.  History of  Hematuria and Urothelial carcinoma  follows with urology at [facility], post discharge he will follow with them.     Code Status: Full  Family Communication:   Disposition Plan: SNF when appropriate   Procedures    2/18 US Renal: No hydronephrosis.  2/18 Exploratory laparotomy with drainage of retroperitoneal and preperitoneal soft tissue infection  2/19 Intubated. 2/19 Bronchoscopy,Sent purulent fluid  2/20 extubated     LINES / TUBES:    2/18 Foley Cath>>  2/18 PIV x1>>  2/18 RIJ>>>2/20  2/19 OETT>>2/20   Consults Urology,CCS, PCCM, ID cord.    Medications  Scheduled Meds: . albuterol  2.5 mg Nebulization BID  . ampicillin-sulbactam (UNASYN) IV  3 g Intravenous Q6H  . antiseptic oral rinse  15 mL Mouth Rinse QID  . chlorhexidine  15 mL Mouth Rinse BID  . feeding supplement (GLUCERNA SHAKE)  237 mL Oral BID BM  . folic acid  1 mg Oral Daily  . heparin subcutaneous  5,000 Units Subcutaneous 3 times per day  . hydrocortisone sod succinate (SOLU-CORTEF) inj  25 mg Intravenous Q12H  . insulin aspart  0-9 Units Subcutaneous 6 times per day  . multivitamin with minerals  1 tablet Oral Daily  . pantoprazole  40 mg Oral Q1200  . sodium chloride  1,000 mL Intravenous Once  . sodium chloride  3 mL Intravenous Q12H  . thiamine  100 mg Oral Daily   Continuous Infusions: . sodium chloride Stopped (02/20/13 1600)  . dextrose 60 mL/hr at 02/22/13 1128  . fentaNYL infusion INTRAVENOUS Stopped (02/20/13 0746)  . norepinephrine (LEVOPHED) Adult infusion    . phenylephrine (NEO-SYNEPHRINE) Adult infusion     PRN Meds:.acetaminophen, acetaminophen, fentaNYL, fentaNYL, food thickener, metoprolol, ondansetron (ZOFRAN) IV, ondansetron  DVT Prophylaxis    Heparin   Lab Results  Component Value Date   PLT 289 02/24/2013    Antibiotics     Anti-infectives   Start     Dose/Rate Route Frequency Ordered Stop   02/23/13 1200  Ampicillin-Sulbactam (UNASYN) 3 g in sodium chloride 0.9 % 100 mL IVPB     3 g 100 mL/hr over 60 Minutes Intravenous Every 6 hours 02/23/13 1109     02/22/13 1600  piperacillin-tazobactam (ZOSYN) IVPB 3.375 g  Status:  Discontinued     3.375 g 12.5 mL/hr over 240 Minutes Intravenous Every 8 hours 02/22/13 1516 02/23/13 1109   02/22/13 1200  vancomycin (VANCOCIN) IVPB 1000 mg/200 mL premix  Status:  Discontinued     1,000 mg 200 mL/hr over 60 Minutes Intravenous Every 12 hours 02/22/13 1111 02/23/13 1109    02/19/13 2300  vancomycin (VANCOCIN) IVPB 750 mg/150 ml premix  Status:  Discontinued     750 mg 150 mL/hr over 60 Minutes Intravenous Every 12 hours 02/19/13 1436 02/20/13 0943   02/19/13 1500  meropenem (MERREM) 1 g in sodium chloride 0.9 % 100 mL IVPB  Status:  Discontinued     1 g 200 mL/hr over 30 Minutes Intravenous 3 times per day 02/19/13 1436 02/22/13 1455   02/19/13 1000  vancomycin (VANCOCIN) IVPB 750 mg/150 ml premix  Status:  Discontinued     750 mg 150 mL/hr over 60 Minutes Intravenous Every 24 hours 02/18/13 1031 02/19/13 1437   02/18/13 1800  clindamycin (CLEOCIN) IVPB 900 mg  Status:  Discontinued     900 mg 100 mL/hr over 30 Minutes Intravenous 4 times per day 02/18/13 1626 02/20/13 0943  02/18/13 1800  meropenem (MERREM) 500 mg in sodium chloride 0.9 % 50 mL IVPB  Status:  Discontinued     500 mg 100 mL/hr over 30 Minutes Intravenous Every 12 hours 02/18/13 1649 02/19/13 1436   02/18/13 1200  piperacillin-tazobactam (ZOSYN) IVPB 2.25 g  Status:  Discontinued     2.25 g 100 mL/hr over 30 Minutes Intravenous 4 times per day 02/18/13 1026 02/18/13 1626   02/18/13 1100  vancomycin (VANCOCIN) 1,500 mg in sodium chloride 0.9 % 500 mL IVPB     1,500 mg 250 mL/hr over 120 Minutes Intravenous  Once 02/18/13 1026 02/18/13 1300   02/18/13 0800  cefTRIAXone (ROCEPHIN) 1 g in dextrose 5 % 50 mL IVPB     1 g Intravenous  Once 02/18/13 0752 02/18/13 0848          Subjective:   Jason [name] today has no complaints.  Appears comfortable.  Requests ice water for hiccups.  Objective:   Filed Vitals:   02/23/13 2139 02/24/13 0509 02/24/13 0843 02/24/13 1324  BP:  132/71  100/61  Pulse:  73  83  Temp:  98.5 F (36.9 C)  97.3 F (36.3 C)  TempSrc:  Oral  Oral  Resp:  17  18  Height:      Weight:  70 kg (154 lb 5.2 oz)    SpO2: 97% 94% 92% 93%    Wt Readings from Last 3 Encounters:  02/24/13 70 kg (154 lb 5.2 oz)  02/24/13 70 kg (154 lb 5.2 oz)  02/10/13 67.586 kg  (149 lb)     Intake/Output Summary (Last 24 hours) at 02/24/13 1447 Last data filed at 02/24/13 1300  Gross per 24 hour  Intake   3636 ml  Output   1960 ml  Net   1676 ml     Physical Exam  Awake Alert, Oriented X 3, No new F.N deficits, Normal affect, pleasant. + hiccups. [place].AT,PERRAL Supple Neck,No JVD, No cervical lymphadenopathy appriciated.  Symmetrical Chest wall movement, Good air movement bilaterally, Coarse Bilat B sounds RRR,No Gallops,Rubs or new Murmurs, No Parasternal Heave +ve B.Sounds, Abd Soft, Non tender, No organomegaly appriciated, No rebound . Penrose drain & foley in place No Cyanosis, Clubbing or edema, No new Rash or bruise      Data Review   Micro Results Recent Results (from the past 240 hour(s))  URINE CULTURE     Status: None   Collection Time    02/18/13  7:20 AM      Result Value Ref Range Status   Specimen Description URINE, CLEAN CATCH   Final   Special Requests NONE   Final   Culture  Setup Time     Final   Value: 02/18/2013 10:26     Performed at [facility]     Final   Value: >=100,000 COLONIES/ML     Performed at [facility]   Culture     Final   Value: ESCHERICHIA COLI     Performed at [facility]   Report Status 02/22/2013 FINAL   Final   Organism ID, Bacteria ESCHERICHIA COLI   Final  CULTURE, BLOOD (ROUTINE X 2)     Status: None   Collection Time    02/18/13  8:15 AM      Result Value Ref Range Status   Specimen Description BLOOD SITE NOT SPECIFIED DRAWN BY RN   Final   Special Requests BOTTLES DRAWN AEROBIC AND ANAEROBIC  4CC   Final   Culture NO GROWTH 5 DAYS   Final   Report Status 02/23/2013 FINAL   Final  CULTURE, BLOOD (ROUTINE X 2)     Status: None   Collection Time    02/18/13  8:30 AM      Result Value Ref Range Status   Specimen Description BLOOD RIGHT ARM   Final   Special Requests BOTTLES DRAWN AEROBIC ONLY [facility]   Final   Culture  Setup Time     Final   Value:  02/20/2013 01:21     Performed at [facility]   Culture     Final   Value: ESCHERICHIA COLI     1822 Note: Gram Stain Report Called to,Read Back By and Verified With: Girard Cooter 02/19/2013 BAUGHAM Performed at [facility]     Performed at [facility]   Report Status 02/22/2013 FINAL   Final   Organism ID, Bacteria ESCHERICHIA COLI   Final  MRSA PCR SCREENING     Status: None   Collection Time    02/18/13 10:13 AM      Result Value Ref Range Status   MRSA by PCR NEGATIVE  NEGATIVE Final   Comment:            The GeneXpert MRSA Assay (FDA     approved for NASAL specimens     only), is one component of a     comprehensive MRSA colonization     surveillance program. It is not     intended to diagnose MRSA     infection nor to guide or     monitor treatment for     MRSA infections.  AFB CULTURE WITH SMEAR     Status: None   Collection Time    02/19/13 12:28 AM      Result Value Ref Range Status   Specimen Description ABSCESS ABDOMEN   Final   Special Requests PRE PERITONEAL   Final   ACID FAST SMEAR     Final   Value: NO ACID FAST BACILLI SEEN     Performed at [facility]   Culture     Final   Value: CULTURE WILL BE EXAMINED FOR 6 WEEKS BEFORE ISSUING A FINAL REPORT     Performed at [facility]   Report Status PENDING   Incomplete  ANAEROBIC CULTURE     Status: None   Collection Time    02/19/13 12:30 AM      Result Value Ref Range Status   Specimen Description ABSCESS ABDOMEN   Final   Special Requests PRE PERITONEAL   Final   Gram Stain     Final   Value: ABUNDANT WBC PRESENT,BOTH PMN AND MONONUCLEAR     NO SQUAMOUS EPITHELIAL CELLS SEEN     MODERATE GRAM NEGATIVE RODS     Performed at [facility]   Culture     Final   Value: NO ANAEROBES ISOLATED     Performed at [facility]   Report Status 02/24/2013 FINAL   Final  CULTURE, ROUTINE-ABSCESS     Status: None   Collection Time    02/19/13 12:31 AM       Result Value Ref Range Status   Specimen Description ABSCESS ABDOMEN   Final   Special Requests PRE PERITONEAL   Final   Gram Stain     Final   Value: ABUNDANT WBC PRESENT,BOTH PMN AND MONONUCLEAR  NO SQUAMOUS EPITHELIAL CELLS SEEN     MODERATE GRAM NEGATIVE RODS     Performed at [facility]   Culture     Final   Value: MODERATE ESCHERICHIA COLI     Performed at [facility]   Report Status 02/21/2013 FINAL   Final   Organism ID, Bacteria ESCHERICHIA COLI   Final  FUNGUS CULTURE W SMEAR     Status: None   Collection Time    02/19/13 12:33 AM      Result Value Ref Range Status   Specimen Description ABSCESS ABDOMEN   Final   Special Requests PRE PERITONEAL   Final   Fungal Smear     Final   Value: NO YEAST OR FUNGAL ELEMENTS SEEN     Performed at [facility]   Culture     Final   Value: CULTURE IN PROGRESS FOR FOUR WEEKS     Performed at [facility]   Report Status PENDING   Incomplete  CULTURE, RESPIRATORY (NON-EXPECTORATED)     Status: None   Collection Time    02/19/13  5:38 AM      Result Value Ref Range Status   Specimen Description TRACHEAL ASPIRATE   Final   Special Requests NONE   Final   Gram Stain     Final   Value: NO WBC SEEN     NO SQUAMOUS EPITHELIAL CELLS SEEN     NO ORGANISMS SEEN     Performed at [facility]   Culture     Final   Value: Non-Pathogenic Oropharyngeal-type Flora Isolated.     Performed at [facility]   Report Status 02/21/2013 FINAL   Final  CULTURE, RESPIRATORY (NON-EXPECTORATED)     Status: None   Collection Time    02/19/13  9:53 AM      Result Value Ref Range Status   Specimen Description TRACHEAL ASPIRATE   Final   Special Requests NONE   Final   Gram Stain     Final   Value: FEW WBC PRESENT, PREDOMINANTLY PMN     RARE SQUAMOUS EPITHELIAL CELLS PRESENT     RARE GRAM POSITIVE COCCI     IN PAIRS     Performed at [facility]   Culture     Final   Value: Non-Pathogenic  Oropharyngeal-type Flora Isolated.     Performed at [facility]   Report Status 02/21/2013 FINAL   Final    Radiology Reports Ct Abdomen Pelvis Wo Contrast  02/18/2013   CLINICAL DATA:  Fever. Recent transurethral resection of bladder tumor. Extraluminal gas collection in the left side of the pelvis.  EXAM: CT ABDOMEN AND PELVIS WITHOUT CONTRAST  TECHNIQUE: Multidetector CT imaging of the abdomen and pelvis was performed following the standard protocol without intravenous contrast.  COMPARISON:  Radiograph dated 02/18/2013 and CT scan dated 08/30/2006  FINDINGS: There are bilateral extensive lower lobe consolidative pulmonary infiltrates. No effusions. Heart size is normal. Tiny hiatal hernia.  There is extensive extraluminal air in the soft tissues of the pelvis including the presacral space and along the left side of the pelvis along the fascia of the left antral operator muscle as well as extending anteriorly along the deep fascia of the rectus muscles. This is consistent with and anaerobic infection and possible necrotizing fasciitis.  The liver, biliary tree, spleen, pancreas, adrenal glands, and kidneys demonstrate no significant abnormality. The bowel appears normal. Foley catheter is in place  in the bladder with a small amount of air in the bladder.  IMPRESSION: 1. Evidence consistent with anaerobic soft tissue infection in the pelvis with extension to the fascia of the rectus muscles and of the left internal obturator muscle consistent with necrotizing fasciitis. No visible pus collections in the pelvis. 2. Extensive bilateral lower lobe pneumonia.  Critical Value/emergent results were called by telephone at the time of interpretation on 02/18/2013 at 3:55 PM to Dr. Murray Hodgkins , who verbally acknowledged these results.   Electronically Signed   By: Rozetta Nunnery M.D.   On: 02/18/2013 15:56     US Renal  02/18/2013   CLINICAL DATA:  Acute renal failure  EXAM: RENAL/URINARY TRACT  ULTRASOUND COMPLETE  COMPARISON:  01/13/2013  FINDINGS: Right Kidney:  Length: 11.2 cm.  No mass or hydronephrosis.  Left Kidney:  Length: 11.0 cm.  No mass or hydronephrosis.  Bladder:  Poorly visualized/decompressed by indwelling Foley catheter.  IMPRESSION: No hydronephrosis.  Bladder poorly visualized/decompressed by indwelling Foley catheter.   Electronically Signed   By: Julian Hy M.D.   On: 02/18/2013 13:36      Dg Abd 2 Views  02/18/2013   CLINICAL DATA:  Bladder tumor, urothelial carcinoma, possible vomiting, post TURBT 02/13/2013  EXAM: ABDOMEN - 2 VIEW  COMPARISON:  None  FINDINGS: Bibasilar infiltrates question pneumonia versus aspiration.  Air-filled nondistended small bowel loops the mid abdomen, question postoperative ileus.  Bubbly gas identified in the pelvis, could be related to stool within the distal colon but extending farther left lateral than is typically seen with rectal vault stool, raising question of extraperitoneal gas in the left pelvis.  Bones unremarkable.  No urinary tract calcification.  No definite free intraperitoneal air.  IMPRESSION: Bibasilar opacities question pneumonia versus aspiration.  Potential postoperative ileus.  Bubbly gas extending farther left laterally in the inferior pelvis than is typically seen with stool, question prominent stool versus extraperitoneal extraluminal gas in patient who has had a recent cystoscopy and TURBT ; CT imaging of the abdomen and pelvis with IV and oral contrast recommended.  Findings called to Dr. Sarajane Jews on 02/18/2013 at 1315 hr.   Electronically Signed   By: Lavonia Dana M.D.   On: 02/18/2013 13:16    CBC  Recent Labs Lab 02/18/13 0647 02/21/13 0312 02/21/13 0955 02/22/13 0805 02/23/13 0640 02/24/13 0542  WBC 14.4* 29.2* 26.7* 27.6* 27.6* 21.9*  HGB 13.9 11.4* 10.9* 12.4* 11.7* 12.3*  HCT 39.9 32.8* 31.8* 36.2* 33.5* 36.3*  PLT 141* 227 238 266 253 289  MCV 68.7* 69.2* 70.2* 69.9* 68.9* 70.2*  MCH 23.9*  24.1* 24.1* 23.9* 24.1* 23.8*  MCHC 34.8 34.8 34.3 34.3 34.9 33.9  RDW 14.2 13.5 13.9 13.3 13.0 13.4  LYMPHSABS 0.9  --   --   --   --   --   MONOABS 0.7  --   --   --   --   --   EOSABS 0.0  --   --   --   --   --   BASOSABS 0.0  --   --   --   --   --     Chemistries   Recent Labs Lab 02/18/13 2032 02/19/13 0500 02/19/13 1230  02/20/13 0025 02/20/13 0432 02/21/13 0312 02/22/13 0455 02/23/13 0640 02/24/13 0542  NA 144 143 145  < >  --  147 150* 147 143 141  K 3.8 4.5 4.4  < >  --  4.3 3.9 3.4*  3.9 3.3*  CL 112 109 113*  < >  --  114* 115* 109 105 101  CO2 20 19 21   < >  --  22 24 27 29  32  GLUCOSE 109* 103* 122*  < >  --  187* 136* 104* 127* 92  BUN 63* 51* 42*  < >  --  39* 40* 29* 22 16  CREATININE 1.41* 1.13 1.02  < >  --  1.02 1.09 0.89 0.84 0.89  CALCIUM 7.2* 7.5* 7.9*  < >  --  8.0* 8.0* 8.1* 7.7* 7.8*  MG  --  2.5 2.5  --  2.5  --   --   --   --   --   AST  --  43*  --   --   --   --   --   --   --   --   ALT  --  42  --   --   --   --   --   --   --   --   ALKPHOS  --  105  --   --   --   --   --   --   --   --   BILITOT  --  1.5*  --   --   --   --   --   --   --   --   < > = values in this interval not displayed.  Coagulation profile  Recent Labs Lab 02/18/13 2032  INR 1.19     Time Spent in minutes   35   York, Bobby Rumpf PA-C on 02/24/2013 at 2:47 PM  Between 7am to 7pm - Pager - [phone]  After 7pm go to [email or web address] - password [email or web address]  And look for the night coverage person covering for me after hours  [facility] Office  [phone]

## 2013-02-24 NOTE — Progress Notes (Signed)
  I have directly reviewed the clinical findings, lab, imaging studies and management of this patient in detail. I have interviewed and examined the patient and agree with the documentation,  as recorded by the Physician extender.  Thurnell Lose M.D on 02/24/2013 at [facility] Office  [phone]

## 2013-02-24 NOTE — Progress Notes (Signed)
Urology Inpatient Progress Report  ID: s/p TURBT for ~4cm bladder tumor on left lateral wall on 4/49/67, complicated by bladder perforation.  Subsequently presented to OSH after removing his catheter accidentally.  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  He is s/p ex. Lap/ pelvis washout/debridement, 02/19/13.      Intv/Subj: No acute events overnight. Patient is without complaint. Eating, denies nausea, moving bowels. Pain well controlled.   Past Medical History  Diagnosis Date  . Hypertension   . Stroke     Dementia, otherwise, no residual  . Bladder mass 02-10-13    surgery planned for this  . 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 COPD (chronic obstructive pulmonary disease)     previous history and current smoking  . 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 Dementia 02/18/2013   Current Facility-Administered Medications  Medication Dose Route Frequency Provider Last Rate Last Dose  . 0.9 %  sodium chloride infusion   Intravenous Continuous Leone Haven, MD      . acetaminophen (TYLENOL) tablet 650 mg  650 mg Oral Q6H PRN Samuella Cota, MD   650 mg at 02/18/13 1717   Or  . acetaminophen (TYLENOL) suppository 650 mg  650 mg Rectal Q6H PRN Samuella Cota, MD      . albuterol (PROVENTIL) (2.5 MG/3ML) 0.083% nebulizer solution 2.5 mg  2.5 mg Nebulization BID Renee A Kuneff, DO   2.5 mg at 02/24/13 0842  . Ampicillin-Sulbactam (UNASYN) 3 g in sodium chloride 0.9 % 100 mL IVPB  3 g Intravenous Q6H [name], MD   3 g at 02/24/13 5916  . antiseptic oral rinse (BIOTENE) solution 15 mL  15 mL Mouth Rinse QID Renee A Kuneff, DO   15 mL at 02/24/13 0400  . chlorhexidine (PERIDEX) 0.12 % solution 15 mL  15 mL Mouth Rinse BID Renee A Kuneff, DO   15 mL at 02/23/13 2005  . dextrose 5 % solution   Intravenous Continuous Renee A Kuneff, DO 60 mL/hr at 02/22/13 1128    . dextrose 5 % with KCl 20 mEq / L  infusion  20 mEq  Intravenous Continuous Thurnell Lose, MD 75 mL/hr at 02/23/13 0842 20 mEq at 02/23/13 0842  . feeding supplement (GLUCERNA SHAKE) (GLUCERNA SHAKE) liquid 237 mL  237 mL Oral BID BM Erlene Quan, RD      . fentaNYL (SUBLIMAZE) 10 mcg/mL in sodium chloride 0.9 % 250 mL infusion  0-400 mcg/hr Intravenous Continuous Renee A Kuneff, DO   50 mcg/hr at 02/19/13 1800  . fentaNYL (SUBLIMAZE) bolus via infusion 25-50 mcg  25-50 mcg Intravenous Q1H PRN Renee A Kuneff, DO      . fentaNYL (SUBLIMAZE) injection 50 mcg  50 mcg Intravenous Q2H PRN Renee A Kuneff, DO   50 mcg at 02/21/13 1700  . folic acid (FOLVITE) tablet 1 mg  1 mg Oral Daily Thurnell Lose, MD      . food thickener (THICK IT) powder   Oral PRN Thurnell Lose, MD      . heparin injection 5,000 Units  5,000 Units Subcutaneous 3 times per day Imogene Burn. Tsuei, MD   5,000 Units at 02/24/13 [phone]  . hydrocortisone sodium succinate (SOLU-CORTEF) 100 MG injection 25 mg  25 mg Intravenous Q12H Cherene Altes, MD   25 mg at 02/24/13 6599  . insulin aspart (novoLOG) injection 0-9 Units  0-9 Units Subcutaneous 6 times  per day Raylene Miyamoto, MD   1 Units at 02/24/13 [phone]  . metoprolol (LOPRESSOR) injection 2.5-5 mg  2.5-5 mg Intravenous Q3H PRN Colbert Coyer, MD      . multivitamin with minerals tablet 1 tablet  1 tablet Oral Daily Erlene Quan, RD   1 tablet at 02/23/13 1209  . norepinephrine (LEVOPHED) 4 mg in dextrose 5 % 250 mL infusion  2-50 mcg/min Intravenous Titrated Renee A Kuneff, DO      . ondansetron (ZOFRAN) tablet 4 mg  4 mg Oral Q6H PRN Samuella Cota, MD       Or  . ondansetron [facility]) injection 4 mg  4 mg Intravenous Q6H PRN Samuella Cota, MD      . pantoprazole (PROTONIX) EC tablet 40 mg  40 mg Oral Q1200 Thurnell Lose, MD      . phenylephrine (NEO-SYNEPHRINE) 10 mg in dextrose 5 % 250 mL infusion  30-200 mcg/min Intravenous Titrated Juanito Doom, MD      . sodium chloride 0.9 % bolus 1,000  mL  1,000 mL Intravenous Once Renee A Kuneff, DO      . sodium chloride 0.9 % injection 3 mL  3 mL Intravenous Q12H Samuella Cota, MD   3 mL at 02/23/13 2234  . thiamine (VITAMIN B-1) tablet 100 mg  100 mg Oral Daily Thurnell Lose, MD        Objective: Vital: Filed Vitals:   02/23/13 2053 02/23/13 2139 02/24/13 0509 02/24/13 0843  BP: 137/68  132/71   Pulse: 65  73   Temp: 99.9 F (37.7 C)  98.5 F (36.9 C)   TempSrc: Oral  Oral   Resp: 18  17   Height:      Weight:   70 kg (154 lb 5.2 oz)   SpO2: 97% 97% 94% 92%   I/Os: I/O last 3 completed shifts: In: 72 [P.O.:240; I.V.:3876; IV Piggyback:700] Out: 2495 [Urine:2275; Drains:220]  Physical Exam:  General: Patient is in no apparent distress Lungs: coarse breath sounds GI: The abdomen is soft a, nontender.  Midline wound opened/packed and starting to granulate.  He has two penrose drains on either side of his pelvis, the right one appears to be draining purulent effluent, left penrose with clean dressing. JP draining serosanginous fluid.   Foley is draining clear yellow urine. Ext: lower extremities symmetric  Lab Results:  Recent Labs  02/22/13 0805 02/23/13 0640 02/24/13 0542  WBC 27.6* 27.6* 21.9*  HGB 12.4* 11.7* 12.3*  HCT 36.2* 33.5* 36.3*    Recent Labs  02/22/13 0455 02/23/13 0640 02/24/13 0542  NA 147 143 141  K 3.4* 3.9 3.3*  CL 109 105 101  CO2 27 29 32  GLUCOSE 104* 127* 92  BUN 29* 22 16  CREATININE 0.89 0.84 0.89  CALCIUM 8.1* 7.7* 7.8*   No results found for this basename: LABPT, INR,  in the last 72 hours No results found for this basename: LABURIN,  in the last 72 hours Results for orders placed during the hospital encounter of 02/18/13  URINE CULTURE     Status: None   Collection Time    02/18/13  7:20 AM      Result Value Ref Range Status   Specimen Description URINE, CLEAN CATCH   Final   Special Requests NONE   Final   Culture  Setup Time     Final   Value: 02/18/2013  10:26     Performed  at [facility]     Final   Value: >=100,000 COLONIES/ML     Performed at [facility]   Culture     Final   Value: ESCHERICHIA COLI     Performed at [facility]   Report Status 02/22/2013 FINAL   Final   Organism ID, Bacteria ESCHERICHIA COLI   Final  CULTURE, BLOOD (ROUTINE X 2)     Status: None   Collection Time    02/18/13  8:15 AM      Result Value Ref Range Status   Specimen Description BLOOD SITE NOT SPECIFIED DRAWN BY RN   Final   Special Requests BOTTLES DRAWN AEROBIC AND ANAEROBIC 4CC   Final   Culture NO GROWTH 5 DAYS   Final   Report Status 02/23/2013 FINAL   Final  CULTURE, BLOOD (ROUTINE X 2)     Status: None   Collection Time    02/18/13  8:30 AM      Result Value Ref Range Status   Specimen Description BLOOD RIGHT ARM   Final   Special Requests BOTTLES DRAWN AEROBIC ONLY [facility]   Final   Culture  Setup Time     Final   Value: 02/20/2013 01:21     Performed at [facility]   Culture     Final   Value: ESCHERICHIA COLI     1822 Note: Gram Stain Report Called to,Read Back By and Verified With: Girard Cooter 02/19/2013 B[place]GHAM Performed at [facility]     Performed at [facility]   Report Status 02/22/2013 FINAL   Final   Organism ID, Bacteria ESCHERICHIA COLI   Final  MRSA PCR SCREENING     Status: None   Collection Time    02/18/13 10:13 AM      Result Value Ref Range Status   MRSA by PCR NEGATIVE  NEGATIVE Final   Comment:            The GeneXpert MRSA Assay (FDA     approved for NASAL specimens     only), is one component of a     comprehensive MRSA colonization     surveillance program. It is not     intended to diagnose MRSA     infection nor to guide or     monitor treatment for     MRSA infections.  AFB CULTURE WITH SMEAR     Status: None   Collection Time    02/19/13 12:28 AM      Result Value Ref Range Status   Specimen Description ABSCESS ABDOMEN   Final    Special Requests PRE PERITONEAL   Final   ACID FAST SMEAR     Final   Value: NO ACID FAST BACILLI SEEN     Performed at [facility]   Culture     Final   Value: CULTURE WILL BE EXAMINED FOR 6 WEEKS BEFORE ISSUING A FINAL REPORT     Performed at [facility]   Report Status PENDING   Incomplete  ANAEROBIC CULTURE     Status: None   Collection Time    02/19/13 12:30 AM      Result Value Ref Range Status   Specimen Description ABSCESS ABDOMEN   Final   Special Requests PRE PERITONEAL   Final   Gram Stain     Final   Value: ABUNDANT WBC PRESENT,BOTH PMN AND MONONUCLEAR  NO SQUAMOUS EPITHELIAL CELLS SEEN     MODERATE GRAM NEGATIVE RODS     Performed at [facility]   Culture     Final   Value: NO ANAEROBES ISOLATED; CULTURE IN PROGRESS FOR 5 DAYS     Performed at [facility]   Report Status PENDING   Incomplete  CULTURE, ROUTINE-ABSCESS     Status: None   Collection Time    02/19/13 12:31 AM      Result Value Ref Range Status   Specimen Description ABSCESS ABDOMEN   Final   Special Requests PRE PERITONEAL   Final   Gram Stain     Final   Value: ABUNDANT WBC PRESENT,BOTH PMN AND MONONUCLEAR     NO SQUAMOUS EPITHELIAL CELLS SEEN     MODERATE GRAM NEGATIVE RODS     Performed at [facility]   Culture     Final   Value: MODERATE ESCHERICHIA COLI     Performed at [facility]   Report Status 02/21/2013 FINAL   Final   Organism ID, Bacteria ESCHERICHIA COLI   Final  FUNGUS CULTURE W SMEAR     Status: None   Collection Time    02/19/13 12:33 AM      Result Value Ref Range Status   Specimen Description ABSCESS ABDOMEN   Final   Special Requests PRE PERITONEAL   Final   Fungal Smear     Final   Value: NO YEAST OR FUNGAL ELEMENTS SEEN     Performed at [facility]   Culture     Final   Value: CULTURE IN PROGRESS FOR FOUR WEEKS     Performed at [facility]   Report Status PENDING   Incomplete  CULTURE,  RESPIRATORY (NON-EXPECTORATED)     Status: None   Collection Time    02/19/13  5:38 AM      Result Value Ref Range Status   Specimen Description TRACHEAL ASPIRATE   Final   Special Requests NONE   Final   Gram Stain     Final   Value: NO WBC SEEN     NO SQUAMOUS EPITHELIAL CELLS SEEN     NO ORGANISMS SEEN     Performed at [facility]   Culture     Final   Value: Non-Pathogenic Oropharyngeal-type Flora Isolated.     Performed at [facility]   Report Status 02/21/2013 FINAL   Final  CULTURE, RESPIRATORY (NON-EXPECTORATED)     Status: None   Collection Time    02/19/13  9:53 AM      Result Value Ref Range Status   Specimen Description TRACHEAL ASPIRATE   Final   Special Requests NONE   Final   Gram Stain     Final   Value: FEW WBC PRESENT, PREDOMINANTLY PMN     RARE SQUAMOUS EPITHELIAL CELLS PRESENT     RARE GRAM POSITIVE COCCI     IN PAIRS     Performed at [facility]   Culture     Final   Value: Non-Pathogenic Oropharyngeal-type Flora Isolated.     Performed at [facility]   Report Status 02/21/2013 FINAL   Final    Assessment: 6 Days Post-Op from pelvic exploration/washout, improving and making great progress.  E.coli grown from pelvic fluid cultures, blood and urine, resistant to Cipro.  WBC down today, which is hopefully indicative of resolving infection.  Elevated WBC likely from both PNA  and pelvic infection.  Plan: Continue to trend WBC, as it normalizes we will slowly back out the drains.  Could consider a wound-vac for midline incision, although it is small and healing with current WTD regimen.  Plan to leave the foley catheter in for at least 2 weeks from time of exploration.  As long as he is improving, we will hold off on any imaging.    The remaining care per IM, ID and Gen Surg., I really appreciate their help/input!  Louis Meckel W 02/24/2013, 9:05 AM

## 2013-02-24 NOTE — Progress Notes (Signed)
Speech Language Pathology Treatment: Dysphagia  Patient Details Name: KOY LAMP MRN: [id number] DOB: [birth year] Today's Date: 02/24/2013 Time: 9935-7017 SLP Time Calculation (min): 14 min  Assessment / Plan / Recommendation Clinical Impression  Pt tolerating thin liquids and regular solids, taking pills in puree though sometimes sips or crushed meds needed with larger pills. Staff following recommendation to avoid straws. Pt takes larger sips despite cues, but pt was observed to tolerate larger cup sips as long as posture is upright. No further SLP therapy needed, continue current diet and precautions.    HPI HPI: Severe Sepsis with possible necrotizing fasciitis of the rectus muscle and left internal obturator , also suspected HCAP - currently hemodynamically stable, status post exploratory laparotomy and drainage by general surgery and urology on 02/18/2013, status post intubation and extubation was extubated on 02/20/2013.    Pertinent Vitals NA  SLP Plan  Continue with current plan of care    Recommendations Diet recommendations: Regular;Thin liquid Liquids provided via: Cup;No straw Medication Administration: Whole meds with puree Supervision: Staff to assist with self feeding;Full supervision/cueing for compensatory strategies Compensations: Slow rate Postural Changes and/or Swallow Maneuvers: Seated upright 90 degrees;Out of bed for meals              Follow up Recommendations: Skilled Nursing facility Plan: Continue with current plan of care    GO    Herbie Baltimore, MA CCC-SLP [phone]  Lynann Beaver 02/24/2013, 1:39 PM

## 2013-02-24 NOTE — Progress Notes (Signed)
6 Days Post-Op  Subjective: Patient awake, no complaints Eating breakfast  Minimal drain output from perirectal drain - serous   Objective: Vital signs in last 24 hours: Temp:  [98 F (36.7 C)-99.9 F (37.7 C)] 98.5 F (36.9 C) (02/24 0509) Pulse Rate:  [65-73] 73 (02/24 0509) Resp:  [17-18] 17 (02/24 0509) BP: (132-144)/(68-72) 132/71 mmHg (02/24 0509) SpO2:  [92 %-99 %] 92 % (02/24 0843) Weight:  [154 lb 5.2 oz (70 kg)] 154 lb 5.2 oz (70 kg) (02/24 0509) Last BM Date: 02/24/13  Intake/Output from previous day: 02/23 0701 - 02/24 0700 In: 3546 [P.O.:120; I.V.:2976; IV Piggyback:450] Out: 2210 [Urine:2100; Drains:110] Intake/Output this shift: Total I/O In: 360 [P.O.:360] Out: -   Wound - clean; beginning to granulate; drain in RLQ serous drainage Penrose drains with some serous output  Lab Results:   Recent Labs  02/23/13 0640 02/24/13 0542  WBC 27.6* 21.9*  HGB 11.7* 12.3*  HCT 33.5* 36.3*  PLT 253 289   BMET  Recent Labs  02/23/13 0640 02/24/13 0542  NA 143 141  K 3.9 3.3*  CL 105 101  CO2 29 32  GLUCOSE 127* 92  BUN 22 16  CREATININE 0.84 0.89  CALCIUM 7.7* 7.8*   PT/INR No results found for this basename: LABPROT, INR,  in the last 72 hours ABG No results found for this basename: PHART, PCO2, PO2, HCO3,  in the last 72 hours  Studies/Results: Dg Chest 2 View  02/23/2013   CLINICAL DATA:  68 year old male with cough, congestion and pneumonia.  EXAM: CHEST  2 VIEW  COMPARISON:  02/22/2013  FINDINGS: Cardiomediastinal silhouette is stable.  Bilateral lower lung atelectasis/ airspace disease/ consolidation noted with probable bilateral pleural effusions.  There is no evidence of pneumothorax, pulmonary edema or acute bony abnormality.  IMPRESSION: Unchanged bilateral lower lung atelectasis/ airspace disease/ consolidation with probable bilateral pleural effusions.   Electronically Signed   By: Hassan Rowan M.D.   On: 02/23/2013 01:45   Dg Abd 2  Views  02/22/2013   CLINICAL DATA:  Abdominal pain  EXAM: ABDOMEN - 2 VIEW  COMPARISON:  CT ABD/PELV WO CM dated 02/18/2013  FINDINGS: There is no evidence of free intraperitoneal gas. Mild distention of small and large bowel loops in and ileus pattern are noted. Surgical drain projects over the pelvis. Multiple Penrose drains project over the pelvis.  IMPRESSION: No free intraperitoneal gas.  Ileus.  Drains project over the pelvis.   Electronically Signed   By: Maryclare Bean M.D.   On: 02/22/2013 13:11   Dg Swallowing Func-speech Pathology  02/23/2013   Katherene Ponto Deblois, CCC-SLP     02/23/2013 10:49 AM Objective Swallowing Evaluation: Modified Barium Swallowing Study   Patient Details  Name: TEREN ZURCHER MRN: [id number] Date of Birth: 02/16/1945  Today's Date: 02/23/2013 Time: 6578-4696 SLP Time Calculation (min): 20 min  Past Medical History:  Past Medical History  Diagnosis Date  . Hypertension   . Stroke     Dementia, otherwise, no residual  . Bladder mass 02-10-13    surgery planned for this  . 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 COPD (chronic obstructive pulmonary disease)     previous history and current smoking  . 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 Dementia 02/18/2013   Past Surgical History:  Past Surgical History  Procedure Laterality Date  . Transurethral resection of bladder tumor with gyrus  (turbt-gyrus) N/A 02/13/2013    Procedure: TRANSURETHRAL RESECTION OF  BLADDER TUMOR WITH GYRUS  (TURBT-GYRUS), bladder biopsies;  Surgeon: Ardis Hughs,  MD;  Location: [facility];  Service: Urology;  Laterality: N/A;  . Cystoscopy/retrograde/ureteroscopy Bilateral 02/13/2013    Procedure: BILATERAL RETROGRADE;  Surgeon: Ardis Hughs,  MD;  Location: [facility];  Service: Urology;  Laterality: Bilateral;   . Laparotomy N/A 02/18/2013    Procedure: EXPLORATORY LAPAROTOMY drainage of retroperitoneal  abscess, drainage of Pre-Peritoneal abscess and Exploration of  bladder perforation;   Surgeon: Imogene Burn. Lemond Griffee, MD;  Location:  [facility];  Service: General;  Laterality: N/A;   HPI:  Severe Sepsis with possible necrotizing fasciitis of the rectus  muscle and left internal obturator , also suspected HCAP -  currently hemodynamically stable, status post exploratory  laparotomy and drainage by general surgery and urology on  02/18/2013, status post intubation and extubation was extubated  on 02/20/2013.      Assessment / Plan / Recommendation Clinical Impression  Clinical impression: Pt demonstrates moderate sensory impairment  impacting safety with large straw sips or with pills given with  liquids. Pts oral phase is characterized by slow pumping and  prolonged mastication of solids, poor posterior containment with  straw sips and struggle to transit pill. Pharyngeal phase is  strong, but there is decreased coordination and timing with straw  sips allowing for silent aspiration during/after the swallow. The  pt is recommended to consume regular solids and cup sips (may be  moderate to large without severe risk), but should not be given  straws and pills must be given whole in puree as there is  significant aspiration of liquids with pills. Fully upright  posture also very important. Recommend OOB if possible. SLP will  follow for tolerance.     Treatment Recommendation  Therapy as outlined in treatment plan below    Diet Recommendation Regular;Thin liquid   Liquid Administration via: No straw;Cup Medication Administration: Whole meds with puree Supervision: Staff to assist with self feeding;Full  supervision/cueing for compensatory strategies Compensations: Slow rate Postural Changes and/or Swallow Maneuvers: Seated upright 90  degrees;Out of bed for meals    Other  Recommendations Oral Care Recommendations: Oral care BID Other Recommendations: Have oral suction available   Follow Up Recommendations  Skilled Nursing facility    Frequency and Duration min 2x/week  2 weeks   Pertinent Vitals/Pain NA    SLP  Swallow Goals     General HPI: Severe Sepsis with possible necrotizing fasciitis of  the rectus muscle and left internal obturator , also suspected  HCAP - currently hemodynamically stable, status post exploratory  laparotomy and drainage by general surgery and urology on  02/18/2013, status post intubation and extubation was extubated  on 02/20/2013.  Type of Study: Modified Barium Swallowing Study Reason for Referral: Objectively evaluate swallowing function Diet Prior to this Study: Dysphagia 3 (soft);Nectar-thick liquids Temperature Spikes Noted: No Respiratory Status: Nasal cannula History of Recent Intubation: Yes Length of Intubations (days): 2 days Date extubated: 02/20/13 Behavior/Cognition: Alert;Cooperative;Confused;Decreased  sustained attention Oral Cavity - Dentition: Poor condition;Missing dentition Oral Motor / Sensory Function: Within functional limits Self-Feeding Abilities: Able to feed self Patient Positioning: Upright in chair Baseline Vocal Quality: Hoarse Volitional Cough: Other (Comment);Weak (requires max cues) Volitional Swallow:  (NT) Anatomy: Within functional limits Pharyngeal Secretions:  (probable standing secretions based on  vocal quality)    Reason for Referral Objectively evaluate swallowing function   Oral Phase Oral Preparation/Oral Phase Oral Phase: Impaired Oral - Thin Oral - Thin Cup: Lingual/palatal  residue Oral - Thin Straw: Lingual/palatal residue;Piecemeal swallowing Oral - Solids Oral - Puree: Lingual/palatal residue;Delayed oral  transit;Reduced posterior propulsion;Lingual pumping Oral - Regular: Reduced posterior propulsion;Delayed oral transit Oral - Pill: Other (Comment) (dramatic posterior head tilt needed  to transit pills)   Pharyngeal Phase Pharyngeal Phase Pharyngeal Phase: Impaired Pharyngeal - Thin Pharyngeal - Thin Cup: Premature spillage to pyriform  sinuses;Pharyngeal residue - valleculae;Pharyngeal residue -  pyriform sinuses (independent second swallow to  clear) Pharyngeal - Thin Straw: Premature spillage to pyriform  sinuses;Penetration/Aspiration after swallow;Moderate aspiration Penetration/Aspiration details (thin straw): Material enters  airway, passes BELOW cords without attempt by patient to eject  out (silent aspiration);Material does not enter airway Pharyngeal - Solids Pharyngeal - Puree: Delayed swallow initiation;Premature spillage  to valleculae Pharyngeal - Regular: Delayed swallow initiation;Premature  spillage to valleculae Pharyngeal - Pill: Delayed swallow  initiation;Penetration/Aspiration before swallow;Moderate  aspiration Penetration/Aspiration details (pill): Material enters airway,  passes BELOW cords without attempt by patient to eject out  (silent aspiration)  Cervical Esophageal Phase    GO    Cervical Esophageal Phase Cervical Esophageal Phase: [facility]         DeBlois, Katherene Ponto 02/23/2013, 10:48 AM     Anti-infectives: Anti-infectives   Start     Dose/Rate Route Frequency Ordered Stop   02/23/13 1200  Ampicillin-Sulbactam (UNASYN) 3 g in sodium chloride 0.9 % 100 mL IVPB     3 g 100 mL/hr over 60 Minutes Intravenous Every 6 hours 02/23/13 1109     02/22/13 1600  piperacillin-tazobactam (ZOSYN) IVPB 3.375 g  Status:  Discontinued     3.375 g 12.5 mL/hr over 240 Minutes Intravenous Every 8 hours 02/22/13 1516 02/23/13 1109   02/22/13 1200  vancomycin (VANCOCIN) IVPB 1000 mg/200 mL premix  Status:  Discontinued     1,000 mg 200 mL/hr over 60 Minutes Intravenous Every 12 hours 02/22/13 1111 02/23/13 1109   02/19/13 2300  vancomycin (VANCOCIN) IVPB 750 mg/150 ml premix  Status:  Discontinued     750 mg 150 mL/hr over 60 Minutes Intravenous Every 12 hours 02/19/13 1436 02/20/13 0943   02/19/13 1500  meropenem (MERREM) 1 g in sodium chloride 0.9 % 100 mL IVPB  Status:  Discontinued     1 g 200 mL/hr over 30 Minutes Intravenous 3 times per day 02/19/13 1436 02/22/13 1455   02/19/13 1000  vancomycin (VANCOCIN) IVPB 750 mg/150  ml premix  Status:  Discontinued     750 mg 150 mL/hr over 60 Minutes Intravenous Every 24 hours 02/18/13 1031 02/19/13 1437   02/18/13 1800  clindamycin (CLEOCIN) IVPB 900 mg  Status:  Discontinued     900 mg 100 mL/hr over 30 Minutes Intravenous 4 times per day 02/18/13 1626 02/20/13 0943   02/18/13 1800  meropenem (MERREM) 500 mg in sodium chloride 0.9 % 50 mL IVPB  Status:  Discontinued     500 mg 100 mL/hr over 30 Minutes Intravenous Every 12 hours 02/18/13 1649 02/19/13 1436   02/18/13 1200  piperacillin-tazobactam (ZOSYN) IVPB 2.25 g  Status:  Discontinued     2.25 g 100 mL/hr over 30 Minutes Intravenous 4 times per day 02/18/13 1026 02/18/13 1626   02/18/13 1100  vancomycin (VANCOCIN) 1,500 mg in sodium chloride 0.9 % 500 mL IVPB     1,500 mg 250 mL/hr over 120 Minutes Intravenous  Once 02/18/13 1026 02/18/13 1300   02/18/13 0800  cefTRIAXone (ROCEPHIN) 1 g in dextrose 5 % 50 mL IVPB  1 g Intravenous  Once 02/18/13 0752 02/18/13 0848      Assessment/Plan: s/p Procedure(s): EXPLORATORY LAPAROTOMY drainage of retroperitoneal abscess, drainage of Pre-Peritoneal abscess and Exploration of bladder perforation (N/A) I removed the JP drain today. Urology will manage the Penrose drains WBC improving Continue dressing changes  LOS: 6 days    Tesean Stump K. 02/24/2013

## 2013-02-24 NOTE — Progress Notes (Signed)
Patient is still asleep, resting good at this time unable to do the flutter. Flutter administration should resume as soon as the patient wakes. RT will continue to assist as needed.

## 2013-02-24 NOTE — Progress Notes (Signed)
The patient has an order for flutter q4 while awake, however the patient is asleep resting at this time. RT will continue to assist as needed.

## 2013-02-24 NOTE — Progress Notes (Signed)
Patient ID: Jason [name], male   DOB: May 12, 1945, 68 y.o.   MRN: [id number]         [facility]    Date of Admission:  02/18/2013   Total days of antibiotics 7         Principal Problem:   Sepsis Active Problems:   ALCOHOL ABUSE   Memory loss   UTI (lower urinary tract infection)   Aspiration pneumonia   Acute respiratory failure with hypoxia   Acute renal failure   Dementia   Hematuria   Urothelial carcinoma   . albuterol  2.5 mg Nebulization BID  . ampicillin-sulbactam (UNASYN) IV  3 g Intravenous Q6H  . antiseptic oral rinse  15 mL Mouth Rinse QID  . chlorhexidine  15 mL Mouth Rinse BID  . feeding supplement (GLUCERNA SHAKE)  237 mL Oral BID BM  . folic acid  1 mg Oral Daily  . heparin subcutaneous  5,000 Units Subcutaneous 3 times per day  . hydrocortisone sod succinate (SOLU-CORTEF) inj  25 mg Intravenous Q12H  . insulin aspart  0-9 Units Subcutaneous 6 times per day  . multivitamin with minerals  1 tablet Oral Daily  . pantoprazole  40 mg Oral Q1200  . sodium chloride  1,000 mL Intravenous Once  . sodium chloride  3 mL Intravenous Q12H  . thiamine  100 mg Oral Daily    Past Medical History  Diagnosis Date  . Hypertension   . Stroke     Dementia, otherwise, no residual  . Bladder mass 02-10-13    surgery planned for this  . 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 COPD (chronic obstructive pulmonary disease)     previous history and current smoking  . 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 Dementia 02/18/2013    History  Substance Use Topics  . Smoking status: Current Every Day Smoker  . Smokeless tobacco: Not on file  . Alcohol Use: Yes    No family history on file.  No Known Allergies  Subjective: He denies pain.  Objective: Temp:  [97.3 F (36.3 C)-99.9 F (37.7 C)] 97.3 F (36.3 C) (02/24 1324) Pulse Rate:  [65-83] 83 (02/24 1324) Resp:  [17-18] 18 (02/24 1324) BP: (100-137)/(61-71) 100/61  mmHg (02/24 1324) SpO2:  [92 %-97 %] 93 % (02/24 1324) Weight:  [154 lb 5.2 oz (70 kg)] 154 lb 5.2 oz (70 kg) (02/24 0509)  General: He is alert and in no distress Lungs: Diffuse rhonchi Cor: Regular S1-S2 no murmurs Abdomen: Thin bloody fluid in JP drain. 110 ccs drain output recorded yesterday. Incisions covered.  Lab Results Lab Results  Component Value Date   WBC 21.9* 02/24/2013   HGB 12.3* 02/24/2013   HCT 36.3* 02/24/2013   MCV 70.2* 02/24/2013   PLT 289 02/24/2013    Lab Results  Component Value Date   CREATININE 0.89 02/24/2013   BUN 16 02/24/2013   NA 141 02/24/2013   K 3.3* 02/24/2013   CL 101 02/24/2013   CO2 32 02/24/2013    Lab Results  Component Value Date   ALT 42 02/19/2013   AST 43* 02/19/2013   ALKPHOS 105 02/19/2013   BILITOT 1.5* 02/19/2013      Microbiology: Recent Results (from the past 240 hour(s))  URINE CULTURE     Status: None   Collection Time    02/18/13  7:20 AM      Result Value Ref Range Status   Specimen Description  URINE, CLEAN CATCH   Final   Special Requests NONE   Final   Culture  Setup Time     Final   Value: 02/18/2013 10:26     Performed at [place]     Final   Value: >=100,000 COLONIES/ML     Performed at [facility]   Culture     Final   Value: ESCHERICHIA COLI     Performed at [facility]   Report Status 02/22/2013 FINAL   Final   Organism ID, Bacteria ESCHERICHIA COLI   Final  CULTURE, BLOOD (ROUTINE X 2)     Status: None   Collection Time    02/18/13  8:15 AM      Result Value Ref Range Status   Specimen Description BLOOD SITE NOT SPECIFIED DRAWN BY RN   Final   Special Requests BOTTLES DRAWN AEROBIC AND ANAEROBIC 4CC   Final   Culture NO GROWTH 5 DAYS   Final   Report Status 02/23/2013 FINAL   Final  CULTURE, BLOOD (ROUTINE X 2)     Status: None   Collection Time    02/18/13  8:30 AM      Result Value Ref Range Status   Specimen Description BLOOD RIGHT ARM   Final   Special  Requests BOTTLES DRAWN AEROBIC ONLY [facility]   Final   Culture  Setup Time     Final   Value: 02/20/2013 01:21     Performed at [facility]   Culture     Final   Value: ESCHERICHIA COLI     1822 Note: Gram Stain Report Called to,Read Back By and Verified With: Girard Cooter 02/19/2013 [place]AM Performed at [facility]     Performed at [facility]   Report Status 02/22/2013 FINAL   Final   Organism ID, Bacteria ESCHERICHIA COLI   Final  MRSA PCR SCREENING     Status: None   Collection Time    02/18/13 10:13 AM      Result Value Ref Range Status   MRSA by PCR NEGATIVE  NEGATIVE Final   Comment:            The GeneXpert MRSA Assay (FDA     approved for NASAL specimens     only), is one component of a     comprehensive MRSA colonization     surveillance program. It is not     intended to diagnose MRSA     infection nor to guide or     monitor treatment for     MRSA infections.  AFB CULTURE WITH SMEAR     Status: None   Collection Time    02/19/13 12:28 AM      Result Value Ref Range Status   Specimen Description ABSCESS ABDOMEN   Final   Special Requests PRE PERITONEAL   Final   ACID FAST SMEAR     Final   Value: NO ACID FAST BACILLI SEEN     Performed at [facility]   Culture     Final   Value: CULTURE WILL BE EXAMINED FOR 6 WEEKS BEFORE ISSUING A FINAL REPORT     Performed at [facility]   Report Status PENDING   Incomplete  ANAEROBIC CULTURE     Status: None   Collection Time    02/19/13 12:30 AM      Result Value Ref Range Status   Specimen Description  ABSCESS ABDOMEN   Final   Special Requests PRE PERITONEAL   Final   Gram Stain     Final   Value: ABUNDANT WBC PRESENT,BOTH PMN AND MONONUCLEAR     NO SQUAMOUS EPITHELIAL CELLS SEEN     MODERATE GRAM NEGATIVE RODS     Performed at [facility]   Culture     Final   Value: NO ANAEROBES ISOLATED     Performed at [facility]   Report Status 02/24/2013 FINAL   Final    CULTURE, ROUTINE-ABSCESS     Status: None   Collection Time    02/19/13 12:31 AM      Result Value Ref Range Status   Specimen Description ABSCESS ABDOMEN   Final   Special Requests PRE PERITONEAL   Final   Gram Stain     Final   Value: ABUNDANT WBC PRESENT,BOTH PMN AND MONONUCLEAR     NO SQUAMOUS EPITHELIAL CELLS SEEN     MODERATE GRAM NEGATIVE RODS     Performed at [facility]   Culture     Final   Value: MODERATE ESCHERICHIA COLI     Performed at [facility]   Report Status 02/21/2013 FINAL   Final   Organism ID, Bacteria ESCHERICHIA COLI   Final  FUNGUS CULTURE W SMEAR     Status: None   Collection Time    02/19/13 12:33 AM      Result Value Ref Range Status   Specimen Description ABSCESS ABDOMEN   Final   Special Requests PRE PERITONEAL   Final   Fungal Smear     Final   Value: NO YEAST OR FUNGAL ELEMENTS SEEN     Performed at [facility]   Culture     Final   Value: CULTURE IN PROGRESS FOR FOUR WEEKS     Performed at [facility]   Report Status PENDING   Incomplete  CULTURE, RESPIRATORY (NON-EXPECTORATED)     Status: None   Collection Time    02/19/13  5:38 AM      Result Value Ref Range Status   Specimen Description TRACHEAL ASPIRATE   Final   Special Requests NONE   Final   Gram Stain     Final   Value: NO WBC SEEN     NO SQUAMOUS EPITHELIAL CELLS SEEN     NO ORGANISMS SEEN     Performed at [facility]   Culture     Final   Value: Non-Pathogenic Oropharyngeal-type Flora Isolated.     Performed at [facility]   Report Status 02/21/2013 FINAL   Final  CULTURE, RESPIRATORY (NON-EXPECTORATED)     Status: None   Collection Time    02/19/13  9:53 AM      Result Value Ref Range Status   Specimen Description TRACHEAL ASPIRATE   Final   Special Requests NONE   Final   Gram Stain     Final   Value: FEW WBC PRESENT, PREDOMINANTLY PMN     RARE SQUAMOUS EPITHELIAL CELLS PRESENT     RARE GRAM POSITIVE COCCI     IN  PAIRS     Performed at [facility]   Culture     Final   Value: Non-Pathogenic Oropharyngeal-type Flora Isolated.     Performed at [facility]   Report Status 02/21/2013 FINAL   Final    Studies/Results: Dg Chest 2 View  02/23/2013   CLINICAL  DATA:  68 year old male with cough, congestion and pneumonia.  EXAM: CHEST  2 VIEW  COMPARISON:  02/22/2013  FINDINGS: Cardiomediastinal silhouette is stable.  Bilateral lower lung atelectasis/ airspace disease/ consolidation noted with probable bilateral pleural effusions.  There is no evidence of pneumothorax, pulmonary edema or acute bony abnormality.  IMPRESSION: Unchanged bilateral lower lung atelectasis/ airspace disease/ consolidation with probable bilateral pleural effusions.   Electronically Signed   By: Hassan Rowan M.D.   On: 02/23/2013 01:45   Dg Swallowing Func-speech Pathology  02/23/2013   Katherene Ponto [place]blois, CCC-SLP     02/23/2013 10:49 AM Objective Swallowing Evaluation: Modified Barium Swallowing Study   Patient Details  Name: CEDAR NARDIELLO MRN: [id number] Date of Birth: September 27, 1945  Today's Date: 02/23/2013 Time: [id number] SLP Time Calculation (min): 20 min  Past Medical History:  Past Medical History  Diagnosis Date  . Hypertension   . Stroke     Dementia, otherwise, no residual  . Bladder mass 02-10-13    surgery planned for this  . 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 COPD (chronic obstructive pulmonary disease)     previous history and current smoking  . 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 Dementia 02/18/2013   Past Surgical History:  Past Surgical History  Procedure Laterality Date  . Transurethral resection of bladder tumor with gyrus  (turbt-gyrus) N/A 02/13/2013    Procedure: TRANSURETHRAL RESECTION OF BLADDER TUMOR WITH GYRUS  (TURBT-GYRUS), bladder biopsies;  Surgeon: Ardis Hughs,  MD;  Location: [facility];  Service: Urology;  Laterality: N/A;  . Cystoscopy/retrograde/ureteroscopy Bilateral  02/13/2013    Procedure: BILATERAL RETROGRADE;  Surgeon: Ardis Hughs,  MD;  Location: [facility];  Service: Urology;  Laterality: Bilateral;   . Laparotomy N/A 02/18/2013    Procedure: EXPLORATORY LAPAROTOMY drainage of retroperitoneal  abscess, drainage of Pre-Peritoneal abscess and Exploration of  bladder perforation;  Surgeon: Imogene Burn. Tsuei, MD;  Location:  [place];  Service: General;  Laterality: N/A;   HPI:  Severe Sepsis with possible necrotizing fasciitis of the rectus  muscle and left internal obturator , also suspected HCAP -  currently hemodynamically stable, status post exploratory  laparotomy and drainage by general surgery and urology on  02/18/2013, status post intubation and extubation was extubated  on 02/20/2013.      Assessment / Plan / Recommendation Clinical Impression  Clinical impression: Pt demonstrates moderate sensory impairment  impacting safety with large straw sips or with pills given with  liquids. Pts oral phase is characterized by slow pumping and  prolonged mastication of solids, poor posterior containment with  straw sips and struggle to transit pill. Pharyngeal phase is  strong, but there is decreased coordination and timing with straw  sips allowing for silent aspiration during/after the swallow. The  pt is recommended to consume regular solids and cup sips (may be  moderate to large without severe risk), but should not be given  straws and pills must be given whole in puree as there is  significant aspiration of liquids with pills. Fully upright  posture also very important. Recommend OOB if possible. SLP will  follow for tolerance.     Treatment Recommendation  Therapy as outlined in treatment plan below    Diet Recommendation Regular;Thin liquid   Liquid Administration via: No straw;Cup Medication Administration: Whole meds with puree Supervision: Staff to assist with self feeding;Full  supervision/cueing for compensatory strategies Compensations: Slow rate Postural Changes  and/or Swallow Maneuvers: Seated upright 90  degrees;Out of bed for meals    Other  Recommendations Oral Care Recommendations: Oral care BID Other Recommendations: Have oral suction available   Follow Up Recommendations  Skilled Nursing facility    Frequency and Duration min 2x/week  2 weeks   Pertinent Vitals/Pain NA    SLP Swallow Goals     General HPI: Severe Sepsis with possible necrotizing fasciitis of  the rectus muscle and left internal obturator , also suspected  HCAP - currently hemodynamically stable, status post exploratory  laparotomy and drainage by general surgery and urology on  02/18/2013, status post intubation and extubation was extubated  on 02/20/2013.  Type of Study: Modified Barium Swallowing Study Reason for Referral: Objectively evaluate swallowing function Diet Prior to this Study: Dysphagia 3 (soft);Nectar-thick liquids Temperature Spikes Noted: No Respiratory Status: Nasal cannula History of Recent Intubation: Yes Length of Intubations (days): 2 days Date extubated: 02/20/13 Behavior/Cognition: Alert;Cooperative;Confused;Decreased  sustained attention Oral Cavity - Dentition: Poor condition;Missing dentition Oral Motor / Sensory Function: Within functional limits Self-Feeding Abilities: Able to feed self Patient Positioning: Upright in chair Baseline Vocal Quality: Hoarse Volitional Cough: Other (Comment);Weak (requires max cues) Volitional Swallow:  (NT) Anatomy: Within functional limits Pharyngeal Secretions:  (probable standing secretions based on  vocal quality)    Reason for Referral Objectively evaluate swallowing function   Oral Phase Oral Preparation/Oral Phase Oral Phase: Impaired Oral - Thin Oral - Thin Cup: Lingual/palatal residue Oral - Thin Straw: Lingual/palatal residue;Piecemeal swallowing Oral - Solids Oral - Puree: Lingual/palatal residue;Delayed oral  transit;Reduced posterior propulsion;Lingual pumping Oral - Regular: Reduced posterior propulsion;Delayed oral transit  Oral - Pill: Other (Comment) (dramatic posterior head tilt needed  to transit pills)   Pharyngeal Phase Pharyngeal Phase Pharyngeal Phase: Impaired Pharyngeal - Thin Pharyngeal - Thin Cup: Premature spillage to pyriform  sinuses;Pharyngeal residue - valleculae;Pharyngeal residue -  pyriform sinuses (independent second swallow to clear) Pharyngeal - Thin Straw: Premature spillage to pyriform  sinuses;Penetration/Aspiration after swallow;Moderate aspiration Penetration/Aspiration details (thin straw): Material enters  airway, passes BELOW cords without attempt by patient to eject  out (silent aspiration);Material does not enter airway Pharyngeal - Solids Pharyngeal - Puree: Delayed swallow initiation;Premature spillage  to valleculae Pharyngeal - Regular: Delayed swallow initiation;Premature  spillage to valleculae Pharyngeal - Pill: Delayed swallow  initiation;Penetration/Aspiration before swallow;Moderate  aspiration Penetration/Aspiration details (pill): Material enters airway,  passes BELOW cords without attempt by patient to eject out  (silent aspiration)  Cervical Esophageal Phase    GO    Cervical Esophageal Phase Cervical Esophageal Phase: [facility]         De[place]is, Katherene Ponto 02/23/2013, 10:48 AM     Assessment: He has grown Escherichia coli from deep pelvic soft tissue, urine and blood. I will narrow antibiotic therapy to IV ampicillin and sulbactam. He will need several more weeks of antibiotic therapy.  Plan: 1. Continue IV ampicillin sulbactam  [name], MD [facility] [place] [facility] [phone] pager   [phone] cell 02/24/2013, 2:20 PM

## 2013-02-25 DIAGNOSIS — T07XXXA Unspecified multiple injuries, initial encounter: Secondary | ICD-10-CM

## 2013-02-25 DIAGNOSIS — X58XXXA Exposure to other specified factors, initial encounter: Secondary | ICD-10-CM

## 2013-02-25 LAB — BASIC METABOLIC PANEL
BUN: 16 mg/dL (ref 6–23)
CO2: 28 mEq/L (ref 19–32)
Calcium: 7.4 mg/dL — ABNORMAL LOW (ref 8.4–10.5)
Chloride: 101 mEq/L (ref 96–112)
Creatinine, Ser: 0.71 mg/dL (ref 0.50–1.35)
GFR calc Af Amer: >90 mL/min (ref >90)
Glucose, Bld: 115 mg/dL — ABNORMAL HIGH (ref 70–99)
Potassium: 3.5 mEq/L — ABNORMAL LOW (ref 3.7–5.3)
Sodium: 139 mEq/L (ref 137–147)

## 2013-02-25 LAB — GLUCOSE, CAPILLARY
GLUCOSE-CAPILLARY: 115 mg/dL — AB (ref 70–99)
GLUCOSE-CAPILLARY: 135 mg/dL — AB (ref 70–99)
GLUCOSE-CAPILLARY: 153 mg/dL — AB (ref 70–99)
Glucose-Capillary: 112 mg/dL — ABNORMAL HIGH (ref 70–99)
Glucose-Capillary: 120 mg/dL — ABNORMAL HIGH (ref 70–99)
Glucose-Capillary: 121 mg/dL — ABNORMAL HIGH (ref 70–99)
Glucose-Capillary: 127 mg/dL — ABNORMAL HIGH (ref 70–99)

## 2013-02-25 LAB — CBC
HEMATOCRIT: 30.9 % — AB (ref 39.0–52.0)
Hemoglobin: 10.4 g/dL — ABNORMAL LOW (ref 13.0–17.0)
MCH: 23.5 pg — ABNORMAL LOW (ref 26.0–34.0)
MCHC: 33.7 g/dL (ref 30.0–36.0)
MCV: 69.8 fL — AB (ref 78.0–100.0)
Platelets: 285 10*3/uL (ref 150–400)
RBC: 4.43 MIL/uL (ref 4.22–5.81)
RDW: 13 % (ref 11.5–15.5)
WBC: 18.9 10*3/uL — ABNORMAL HIGH (ref 4.0–10.5)

## 2013-02-25 MED ORDER — POLYSACCHARIDE IRON COMPLEX 150 MG PO CAPS
150.0000 mg | ORAL_CAPSULE | Freq: Every day | ORAL | Status: DC
  Administered 2013-02-25 – 2013-03-02 (×6): 150 mg via ORAL
  Filled 2013-02-25 (×6): qty 1

## 2013-02-25 MED ORDER — POTASSIUM CHLORIDE CRYS ER 20 MEQ PO TBCR
40.0000 meq | EXTENDED_RELEASE_TABLET | Freq: Once | ORAL | Status: AC
  Administered 2013-02-25: 40 meq via ORAL
  Filled 2013-02-25: qty 2

## 2013-02-25 NOTE — Progress Notes (Signed)
Patient ID: Jason [name], male   DOB: 06-16-1945, 68 y.o.   MRN: [id number]         [facility]    Date of Admission:  02/18/2013   Total days of antibiotics 8         Principal Problem:   Sepsis Active Problems:   ALCOHOL ABUSE   Memory loss   UTI (lower urinary tract infection)   Aspiration pneumonia   Acute respiratory failure with hypoxia   Acute renal failure   Dementia   Hematuria   Urothelial carcinoma   . albuterol  2.5 mg Nebulization BID  . ampicillin-sulbactam (UNASYN) IV  3 g Intravenous Q6H  . antiseptic oral rinse  15 mL Mouth Rinse QID  . chlorhexidine  15 mL Mouth Rinse BID  . feeding supplement (GLUCERNA SHAKE)  237 mL Oral BID BM  . folic acid  1 mg Oral Daily  . heparin subcutaneous  5,000 Units Subcutaneous 3 times per day  . hydrocortisone sod succinate (SOLU-CORTEF) inj  25 mg Intravenous Q12H  . insulin aspart  0-9 Units Subcutaneous 6 times per day  . multivitamin with minerals  1 tablet Oral Daily  . pantoprazole  40 mg Oral Q1200  . sodium chloride  1,000 mL Intravenous Once  . sodium chloride  3 mL Intravenous Q12H  . thiamine  100 mg Oral Daily    Past Medical History  Diagnosis Date  . Hypertension   . Stroke     Dementia, otherwise, no residual  . Bladder mass 02-10-13    surgery planned for this  . 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 COPD (chronic obstructive pulmonary disease)     previous history and current smoking  . 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 Dementia 02/18/2013    History  Substance Use Topics  . Smoking status: Current Every Day Smoker  . Smokeless tobacco: Not on file  . Alcohol Use: Yes    No family history on file.  No Known Allergies  Subjective: He denies pain. He says that he is feeling a little bit better  Objective: Temp:  [97.3 F (36.3 C)-98.3 F (36.8 C)] 97.9 F (36.6 C) (02/25 0351) Pulse Rate:  [74-83] 74 (02/25 0351) Resp:  [18] 18  (02/25 0351) BP: (100-111)/(53-61) 111/53 mmHg (02/25 0351) SpO2:  [92 %-97 %] 92 % (02/25 0917) Weight:  [70.3 kg (154 lb 15.7 oz)] 70.3 kg (154 lb 15.7 oz) (02/25 0351)  General: He is alert and in no distress Lungs: Diffuse rhonchi Cor: Regular S1-S2 no murmurs Abdomen: Large Penrose drains remain in the left lower quadrant and right lower quadrant. Incision covered.  Lab Results Lab Results  Component Value Date   WBC 18.9* 02/25/2013   HGB 10.4* 02/25/2013   HCT 30.9* 02/25/2013   MCV 69.8* 02/25/2013   PLT 285 02/25/2013    Lab Results  Component Value Date   CREATININE 0.71 02/25/2013   BUN 16 02/25/2013   NA 139 02/25/2013   K 3.5* 02/25/2013   CL 101 02/25/2013   CO2 28 02/25/2013    Lab Results  Component Value Date   ALT 42 02/19/2013   AST 43* 02/19/2013   ALKPHOS 105 02/19/2013   BILITOT 1.5* 02/19/2013      Microbiology: Recent Results (from the past 240 hour(s))  URINE CULTURE     Status: None   Collection Time    02/18/13  7:20 AM  Result Value Ref Range Status   Specimen Description URINE, CLEAN CATCH   Final   Special Requests NONE   Final   Culture  Setup Time     Final   Value: 02/18/2013 10:26     Performed at [place]     Final   Value: >=100,000 COLONIES/ML     Performed at [facility]   Culture     Final   Value: ESCHERICHIA COLI     Performed at [facility]   Report Status 02/22/2013 FINAL   Final   Organism ID, Bacteria ESCHERICHIA COLI   Final  CULTURE, BLOOD (ROUTINE X 2)     Status: None   Collection Time    02/18/13  8:15 AM      Result Value Ref Range Status   Specimen Description BLOOD SITE NOT SPECIFIED DRAWN BY RN   Final   Special Requests BOTTLES DRAWN AEROBIC AND ANAEROBIC 4CC   Final   Culture NO GROWTH 5 DAYS   Final   Report Status 02/23/2013 FINAL   Final  CULTURE, BLOOD (ROUTINE X 2)     Status: None   Collection Time    02/18/13  8:30 AM      Result Value Ref Range Status    Specimen Description BLOOD RIGHT ARM   Final   Special Requests BOTTLES DRAWN AEROBIC ONLY [facility]   Final   Culture  Setup Time     Final   Value: 02/20/2013 01:21     Performed at [facility]   Culture     Final   Value: ESCHERICHIA COLI     1822 Note: Gram Stain Report Called to,Read Back By and Verified With: Girard Cooter 02/19/2013 B[place]GHAM Performed at [facility]     Performed at [facility]   Report Status 02/22/2013 FINAL   Final   Organism ID, Bacteria ESCHERICHIA COLI   Final  MRSA PCR SCREENING     Status: None   Collection Time    02/18/13 10:13 AM      Result Value Ref Range Status   MRSA by PCR NEGATIVE  NEGATIVE Final   Comment:            The GeneXpert MRSA Assay (FDA     approved for NASAL specimens     only), is one component of a     comprehensive MRSA colonization     surveillance program. It is not     intended to diagnose MRSA     infection nor to guide or     monitor treatment for     MRSA infections.  AFB CULTURE WITH SMEAR     Status: None   Collection Time    02/19/13 12:28 AM      Result Value Ref Range Status   Specimen Description ABSCESS ABDOMEN   Final   Special Requests PRE PERITONEAL   Final   ACID FAST SMEAR     Final   Value: NO ACID FAST BACILLI SEEN     Performed at [facility]   Culture     Final   Value: CULTURE WILL BE EXAMINED FOR 6 WEEKS BEFORE ISSUING A FINAL REPORT     Performed at [facility]   Report Status PENDING   Incomplete  ANAEROBIC CULTURE     Status: None   Collection Time    02/19/13 12:30 AM  Result Value Ref Range Status   Specimen Description ABSCESS ABDOMEN   Final   Special Requests PRE PERITONEAL   Final   Gram Stain     Final   Value: ABUNDANT WBC PRESENT,BOTH PMN AND MONONUCLEAR     NO SQUAMOUS EPITHELIAL CELLS SEEN     MODERATE GRAM NEGATIVE RODS     Performed at [facility]   Culture     Final   Value: NO ANAEROBES ISOLATED     Performed at [facility]   Report Status 02/24/2013 FINAL   Final  CULTURE, ROUTINE-ABSCESS     Status: None   Collection Time    02/19/13 12:31 AM      Result Value Ref Range Status   Specimen Description ABSCESS ABDOMEN   Final   Special Requests PRE PERITONEAL   Final   Gram Stain     Final   Value: ABUNDANT WBC PRESENT,BOTH PMN AND MONONUCLEAR     NO SQUAMOUS EPITHELIAL CELLS SEEN     MODERATE GRAM NEGATIVE RODS     Performed at [facility]   Culture     Final   Value: MODERATE ESCHERICHIA COLI     Performed at [facility]   Report Status 02/21/2013 FINAL   Final   Organism ID, Bacteria ESCHERICHIA COLI   Final  FUNGUS CULTURE W SMEAR     Status: None   Collection Time    02/19/13 12:33 AM      Result Value Ref Range Status   Specimen Description ABSCESS ABDOMEN   Final   Special Requests PRE PERITONEAL   Final   Fungal Smear     Final   Value: NO YEAST OR FUNGAL ELEMENTS SEEN     Performed at [facility]   Culture     Final   Value: CULTURE IN PROGRESS FOR FOUR WEEKS     Performed at [facility]   Report Status PENDING   Incomplete  CULTURE, RESPIRATORY (NON-EXPECTORATED)     Status: None   Collection Time    02/19/13  5:38 AM      Result Value Ref Range Status   Specimen Description TRACHEAL ASPIRATE   Final   Special Requests NONE   Final   Gram Stain     Final   Value: NO WBC SEEN     NO SQUAMOUS EPITHELIAL CELLS SEEN     NO ORGANISMS SEEN     Performed at [facility]   Culture     Final   Value: Non-Pathogenic Oropharyngeal-type Flora Isolated.     Performed at [facility]   Report Status 02/21/2013 FINAL   Final  CULTURE, RESPIRATORY (NON-EXPECTORATED)     Status: None   Collection Time    02/19/13  9:53 AM      Result Value Ref Range Status   Specimen Description TRACHEAL ASPIRATE   Final   Special Requests NONE   Final   Gram Stain     Final   Value: FEW WBC PRESENT, PREDOMINANTLY PMN     RARE SQUAMOUS  EPITHELIAL CELLS PRESENT     RARE GRAM POSITIVE COCCI     IN PAIRS     Performed at [facility]   Culture     Final   Value: Non-Pathogenic Oropharyngeal-type Flora Isolated.     Performed at [facility]   Report Status 02/21/2013 FINAL   Final  CULTURE, BLOOD (ROUTINE X  2)     Status: None   Collection Time    02/24/13  3:00 PM      Result Value Ref Range Status   Specimen Description BLOOD LEFT ARM   Final   Special Requests BOTTLES DRAWN AEROBIC ONLY 6CC   Final   Culture  Setup Time     Final   Value: 02/24/2013 22:10     Performed at [facility]   Culture     Final   Value:        BLOOD CULTURE RECEIVED NO GROWTH TO DATE CULTURE WILL BE HELD FOR 5 DAYS BEFORE ISSUING A FINAL NEGATIVE REPORT     Performed at [facility]   Report Status PENDING   Incomplete  CULTURE, BLOOD (ROUTINE X 2)     Status: None   Collection Time    02/24/13  3:10 PM      Result Value Ref Range Status   Specimen Description BLOOD LEFT HAND   Final   Special Requests BOTTLES DRAWN AEROBIC ONLY [facility]   Final   Culture  Setup Time     Final   Value: 02/24/2013 22:11     Performed at [facility]   Culture     Final   Value:        BLOOD CULTURE RECEIVED NO GROWTH TO DATE CULTURE WILL BE HELD FOR 5 DAYS BEFORE ISSUING A FINAL NEGATIVE REPORT     Performed at [facility]   Report Status PENDING   Incomplete    Studies/Results: No results found.  Assessment: He seems to be improving slowly on therapy for pelvic floor or necrotizing fasciitis and Escherichia coli bacteremia related to urethral trauma.  Plan: 1. Continue IV ampicillin sulbactam 2. We will followup early next week but please call if needed sooner  [name], MD [facility] for [place] [phone] pager   [phone] cell 02/25/2013, 11:49 AM

## 2013-02-25 NOTE — Clinical Social Work Note (Signed)
CSW met with patient and daughter at bedside. Daughter states that she is agreeable for patient to go to SNF. She states that she would be agreeable to patient going to [facility]. Daughter also stated a facility in [place] would be fine too as patient's sister/caregiver lives there. CSW has received bed offers from both [facility] and [facility], and both facilities contract with [facility]. CSW will update daughter with bed offer from [facility] and get her final decision. Per [facility] social worker daughter has not been involved in patient's care. Patient does not have HPOA, and therefore CSW will defer to patient's daughter for decision making, as patient is not married. Patient's daughter is not unreasonable and will more than likely take other family members' input into consideration for placement decision. CSW will assist with DC when appropriate.  Liz Beach, [place], [place], [phone]

## 2013-02-25 NOTE — Progress Notes (Signed)
Patient Demographics  Jason [name], is a 68 y.o. male, DOB - Nov 25, 1945, FY:[id number]  Admit date - 02/18/2013   Admitting Physician Juanito Doom, MD  Outpatient Primary MD for the patient is [name], MD  LOS - 7   Chief Complaint  Patient presents with  . Pulled Foley out        BRIEF PATIENT DESCRIPTION: 68 yo AAF s/p TURP procedure, presented to [place] ED after pulling out his foley catheter.  He was seen by urology for hematuria, found to have a lobulated mass in bladder and and underwent cystoscopy, TURBT and was discharged home with Foley on 2/13 from  [facility]  This admission his initial lab work revealed ARF, UTI, suspected aspiration pneumonia, and sepsis. He was febrile and hypoxic, with labs and symptoms of sepsis. CT scan ab/pelvis discussed w/ evidence of soft tissue infection in the pelvis with extension to the fascia of the rectus muscles and of the left internal obturator muscle consistent with necrotizing fasciitis. No visible pus collections in the pelvis as well as extensive bilateral lower lobe pneumonia. Blood pressures became soft, but pt responded well to 3L IVF.  He was admitted to pulmonary critical care, required intubation and mechanical ventilation from 02/18/2013 to 02/20/2013, he went to the OR and was operated upon by urology and general surgery, on 02/21/2013 he was transferred to the floor under the care of hospitalist by pulmonary critical care.   [facility] has assumed his care on 02/22/2013   Assessment & Plan    Severe Sepsis with possible necrotizing fasciitis of the rectus muscle and left internal obturator  currently hemodynamically stable - sepsis resolved.  Status post exploratory laparotomy and drainage by general surgery and urology on 02/18/2013,    status post intubation and extubation was extubated on 02/20/2013.    JP drain was removed on 2/24 by general surgery  Penrose drains are being managed by Urology.  Per Dr. Louis Meckel these drains will be very slowly backed out in order to prevent another abscess from forming.  This will most likely take until Monday.  Patient receiving Wet to Dry dressing changes.  Leave foley catheter in place for at least 2 weeks post op per Urology.  E-coli Bacteremia  1 of 2 blood cultures drawn 2/18 positive for e-coli.  Antibiotics are being managed by infectious disease and have been narrowed to Unasyn as of February 23.   Per ID he will need several more weeks of IV antibiotic therapy.  Repeat blood cultures ordered 2/24.  These show NGTD.  Will order PICC placement.   HCAP / Aspiration Pneumonia   Much improved.  Was initially treated with Vancomycin.    This has since been narrowed to Unasyn by Infectious Disease.     Bronch evidence of HCAP with report suggesting possible aspiration.     Speech evaluation completed.  Regular diet with thin liquids recommended.    Pulmonary toiletry, chest PT, Oxygen,  nebulizer treatments as needed.  Acute renal failure.   Due to severe sepsis.  Resolved with IV fluids,   Hypokalemia.    Resolved after supplementation.    Will continue to monitor intermittently.  Dementia.   Will remain at risk for delirium.  Supportive care, avoid benzos.  History of alcohol abuse.   Counseled to quit alcohol.   Folate, thiamine and multivitamins. No signs of DTs.  History of  Hematuria and Urothelial carcinoma  follows with urology at [facility], post discharge he will follow with them.     Code Status: Full  Family Communication: daughter and friend at bedside.  Disposition Plan: SNF vs SNF when appropriate.  Likely Monday 3/2.  Received a call from [facility] [phone]) who let me know that [facility] can provide  PT and Wound care to the patient after discharge.  We will ask PT to evaluate closer to discharge and then discuss with [facility] to determine the best discharge disposition.   Procedures    2/18 US Renal: No hydronephrosis.  2/18 Exploratory laparotomy with drainage of retroperitoneal and preperitoneal soft tissue infection  2/19 Intubated. 2/19 Bronchoscopy,Sent purulent fluid  2/20 extubated  2/25 PICC ordered    LINES / TUBES:  2/18 Foley Cath>>  2/18 PIV x1>>  2/18 RIJ>>>2/20  2/19 OETT>>2/20   Consults Urology,CCS, PCCM, ID cord.    Medications  Scheduled Meds: . albuterol  2.5 mg Nebulization BID  . ampicillin-sulbactam (UNASYN) IV  3 g Intravenous Q6H  . antiseptic oral rinse  15 mL Mouth Rinse QID  . chlorhexidine  15 mL Mouth Rinse BID  . feeding supplement (GLUCERNA SHAKE)  237 mL Oral BID BM  . folic acid  1 mg Oral Daily  . heparin subcutaneous  5,000 Units Subcutaneous 3 times per day  . hydrocortisone sod succinate (SOLU-CORTEF) inj  25 mg Intravenous Q12H  . insulin aspart  0-9 Units Subcutaneous 6 times per day  . multivitamin with minerals  1 tablet Oral Daily  . pantoprazole  40 mg Oral Q1200  . potassium chloride  40 mEq Oral Once  . sodium chloride  1,000 mL Intravenous Once  . sodium chloride  3 mL Intravenous Q12H  . thiamine  100 mg Oral Daily   Continuous Infusions: . sodium chloride Stopped (02/20/13 1600)  . dextrose 60 mL/hr at 02/25/13 0027  . fentaNYL infusion INTRAVENOUS Stopped (02/20/13 0746)  . norepinephrine (LEVOPHED) Adult infusion    . phenylephrine (NEO-SYNEPHRINE) Adult infusion     PRN Meds:.acetaminophen, acetaminophen, fentaNYL, fentaNYL, food thickener, metoprolol, ondansetron (ZOFRAN) IV, ondansetron  DVT Prophylaxis    Heparin   Lab Results  Component Value Date   PLT 285 02/25/2013    Antibiotics     Anti-infectives   Start     Dose/Rate Route Frequency Ordered Stop   02/23/13 1200  Ampicillin-Sulbactam (UNASYN)  3 g in sodium chloride 0.9 % 100 mL IVPB     3 g 100 mL/hr over 60 Minutes Intravenous Every 6 hours 02/23/13 1109     02/22/13 1600  piperacillin-tazobactam (ZOSYN) IVPB 3.375 g  Status:  Discontinued     3.375 g 12.5 mL/hr over 240 Minutes Intravenous Every 8 hours 02/22/13 1516 02/23/13 1109   02/22/13 1200  vancomycin (VANCOCIN) IVPB 1000 mg/200 mL premix  Status:  Discontinued     1,000 mg 200 mL/hr over 60 Minutes Intravenous Every 12 hours 02/22/13 1111 02/23/13 1109   02/19/13 2300  vancomycin (VANCOCIN) IVPB 750 mg/150 ml premix  Status:  Discontinued     750 mg 150 mL/hr over 60 Minutes Intravenous Every 12 hours 02/19/13 1436 02/20/13 0943   02/19/13 1500  meropenem (MERREM) 1 g in sodium chloride 0.9 % 100 mL IVPB  Status:  Discontinued     1 g 200 mL/hr over 30 Minutes Intravenous 3 times per day 02/19/13 1436 02/22/13 1455   02/19/13 1000  vancomycin (VANCOCIN) IVPB 750 mg/150 ml premix  Status:  Discontinued     750 mg 150 mL/hr over 60 Minutes Intravenous Every 24 hours 02/18/13 1031 02/19/13 1437   02/18/13 1800  clindamycin (CLEOCIN) IVPB 900 mg  Status:  Discontinued     900 mg 100 mL/hr over 30 Minutes Intravenous 4 times per day 02/18/13 1626 02/20/13 0943   02/18/13 1800  meropenem (MERREM) 500 mg in sodium chloride 0.9 % 50 mL IVPB  Status:  Discontinued     500 mg 100 mL/hr over 30 Minutes Intravenous Every 12 hours 02/18/13 1649 02/19/13 1436   02/18/13 1200  piperacillin-tazobactam (ZOSYN) IVPB 2.25 g  Status:  Discontinued     2.25 g 100 mL/hr over 30 Minutes Intravenous 4 times per day 02/18/13 1026 02/18/13 1626   02/18/13 1100  vancomycin (VANCOCIN) 1,500 mg in sodium chloride 0.9 % 500 mL IVPB     1,500 mg 250 mL/hr over 120 Minutes Intravenous  Once 02/18/13 1026 02/18/13 1300   02/18/13 0800  cefTRIAXone (ROCEPHIN) 1 g in dextrose 5 % 50 mL IVPB     1 g Intravenous  Once 02/18/13 0752 02/18/13 0848          Subjective:   Dion Philipp Ovens today has  no complaints.  Appears comfortable.  Requests ice water for hiccups.  Objective:   Filed Vitals:   02/24/13 2049 02/25/13 0351 02/25/13 0917 02/25/13 1432  BP: 104/58 111/53  113/64  Pulse: 80 74  76  Temp: 98.3 F (36.8 C) 97.9 F (36.6 C)  98.2 F (36.8 C)  TempSrc: Oral Oral  Oral  Resp: 18 18  18   Height:      Weight:  70.3 kg (154 lb 15.7 oz)    SpO2: 97% 92% 92% 94%    Wt Readings from Last 3 Encounters:  02/25/13 70.3 kg (154 lb 15.7 oz)  02/25/13 70.3 kg (154 lb 15.7 oz)  02/10/13 67.586 kg (149 lb)     Intake/Output Summary (Last 24 hours) at 02/25/13 1440 Last data filed at 02/25/13 0600  Gross per 24 hour  Intake   1142 ml  Output   1100 ml  Net     42 ml     Physical Exam  Awake Alert, Oriented X 3, No new F.N deficits, Normal affect, pleasant. Family at bedside. [place].AT,PERRAL Supple Neck,No JVD, No cervical lymphadenopathy appriciated.  Symmetrical Chest wall movement, Good air movement bilaterally RRR,No Gallops,Rubs or new Murmurs, No Parasternal Heave +ve B.Sounds, Abd Soft, Non tender, No organomegaly appriciated, No rebound . Penrose drain & foley in place No Cyanosis, Clubbing or edema, No new Rash or bruise      Data Review   Micro Results Recent Results (from the past 240 hour(s))  URINE CULTURE     Status: None   Collection Time    02/18/13  7:20 AM      Result Value Ref Range Status   Specimen Description URINE, CLEAN CATCH   Final   Special Requests NONE   Final   Culture  Setup Time     Final   Value: 02/18/2013 10:26     Performed at [place]     Final   Value: >=100,000 COLONIES/ML     Performed at [facility]  Final   Value: ESCHERICHIA COLI     Performed at [facility]   Report Status 02/22/2013 FINAL   Final   Organism ID, Bacteria ESCHERICHIA COLI   Final  CULTURE, BLOOD (ROUTINE X 2)     Status: None   Collection Time    02/18/13  8:15 AM      Result Value  Ref Range Status   Specimen Description BLOOD SITE NOT SPECIFIED DRAWN BY RN   Final   Special Requests BOTTLES DRAWN AEROBIC AND ANAEROBIC 4CC   Final   Culture NO GROWTH 5 DAYS   Final   Report Status 02/23/2013 FINAL   Final  CULTURE, BLOOD (ROUTINE X 2)     Status: None   Collection Time    02/18/13  8:30 AM      Result Value Ref Range Status   Specimen Description BLOOD RIGHT ARM   Final   Special Requests BOTTLES DRAWN AEROBIC ONLY [facility]   Final   Culture  Setup Time     Final   Value: 02/20/2013 01:21     Performed at [facility]   Culture     Final   Value: ESCHERICHIA COLI     1822 Note: Gram Stain Report Called to,Read Back By and Verified With: Girard Cooter 02/19/2013 BAUGHAM Performed at [facility]     Performed at [facility]   Report Status 02/22/2013 FINAL   Final   Organism ID, Bacteria ESCHERICHIA COLI   Final  MRSA PCR SCREENING     Status: None   Collection Time    02/18/13 10:13 AM      Result Value Ref Range Status   MRSA by PCR NEGATIVE  NEGATIVE Final   Comment:            The GeneXpert MRSA Assay (FDA     approved for NASAL specimens     only), is one component of a     comprehensive MRSA colonization     surveillance program. It is not     intended to diagnose MRSA     infection nor to guide or     monitor treatment for     MRSA infections.  AFB CULTURE WITH SMEAR     Status: None   Collection Time    02/19/13 12:28 AM      Result Value Ref Range Status   Specimen Description ABSCESS ABDOMEN   Final   Special Requests PRE PERITONEAL   Final   ACID FAST SMEAR     Final   Value: NO ACID FAST BACILLI SEEN     Performed at [facility]   Culture     Final   Value: CULTURE WILL BE EXAMINED FOR 6 WEEKS BEFORE ISSUING A FINAL REPORT     Performed at [facility]   Report Status PENDING   Incomplete  ANAEROBIC CULTURE     Status: None   Collection Time    02/19/13 12:30 AM      Result Value Ref Range Status    Specimen Description ABSCESS ABDOMEN   Final   Special Requests PRE PERITONEAL   Final   Gram Stain     Final   Value: ABUNDANT WBC PRESENT,BOTH PMN AND MONONUCLEAR     NO SQUAMOUS EPITHELIAL CELLS SEEN     MODERATE GRAM NEGATIVE RODS     Performed at [facility]     Final  Value: NO ANAEROBES ISOLATED     Performed at [facility]   Report Status 02/24/2013 FINAL   Final  CULTURE, ROUTINE-ABSCESS     Status: None   Collection Time    02/19/13 12:31 AM      Result Value Ref Range Status   Specimen Description ABSCESS ABDOMEN   Final   Special Requests PRE PERITONEAL   Final   Gram Stain     Final   Value: ABUNDANT WBC PRESENT,BOTH PMN AND MONONUCLEAR     NO SQUAMOUS EPITHELIAL CELLS SEEN     MODERATE GRAM NEGATIVE RODS     Performed at [facility]   Culture     Final   Value: MODERATE ESCHERICHIA COLI     Performed at [facility]   Report Status 02/21/2013 FINAL   Final   Organism ID, Bacteria ESCHERICHIA COLI   Final  FUNGUS CULTURE W SMEAR     Status: None   Collection Time    02/19/13 12:33 AM      Result Value Ref Range Status   Specimen Description ABSCESS ABDOMEN   Final   Special Requests PRE PERITONEAL   Final   Fungal Smear     Final   Value: NO YEAST OR FUNGAL ELEMENTS SEEN     Performed at [facility]   Culture     Final   Value: CULTURE IN PROGRESS FOR FOUR WEEKS     Performed at [facility]   Report Status PENDING   Incomplete  CULTURE, RESPIRATORY (NON-EXPECTORATED)     Status: None   Collection Time    02/19/13  5:38 AM      Result Value Ref Range Status   Specimen Description TRACHEAL ASPIRATE   Final   Special Requests NONE   Final   Gram Stain     Final   Value: NO WBC SEEN     NO SQUAMOUS EPITHELIAL CELLS SEEN     NO ORGANISMS SEEN     Performed at [facility]   Culture     Final   Value: Non-Pathogenic Oropharyngeal-type Flora Isolated.     Performed at [facility]   Report Status 02/21/2013 FINAL   Final  CULTURE, RESPIRATORY (NON-EXPECTORATED)     Status: None   Collection Time    02/19/13  9:53 AM      Result Value Ref Range Status   Specimen Description TRACHEAL ASPIRATE   Final   Special Requests NONE   Final   Gram Stain     Final   Value: FEW WBC PRESENT, PREDOMINANTLY PMN     RARE SQUAMOUS EPITHELIAL CELLS PRESENT     RARE GRAM POSITIVE COCCI     IN PAIRS     Performed at [facility]   Culture     Final   Value: Non-Pathogenic Oropharyngeal-type Flora Isolated.     Performed at [facility]   Report Status 02/21/2013 FINAL   Final  CULTURE, BLOOD (ROUTINE X 2)     Status: None   Collection Time    02/24/13  3:00 PM      Result Value Ref Range Status   Specimen Description BLOOD LEFT ARM   Final   Special Requests BOTTLES DRAWN AEROBIC ONLY [facility]   Final   Culture  Setup Time     Final   Value: 02/24/2013 22:10     Performed at [facility]  Final   Value:        BLOOD CULTURE RECEIVED NO GROWTH TO DATE CULTURE WILL BE HELD FOR 5 DAYS BEFORE ISSUING A FINAL NEGATIVE REPORT     Performed at [facility]   Report Status PENDING   Incomplete  CULTURE, BLOOD (ROUTINE X 2)     Status: None   Collection Time    02/24/13  3:10 PM      Result Value Ref Range Status   Specimen Description BLOOD LEFT HAND   Final   Special Requests BOTTLES DRAWN AEROBIC ONLY [facility]   Final   Culture  Setup Time     Final   Value: 02/24/2013 22:11     Performed at [facility]   Culture     Final   Value:        BLOOD CULTURE RECEIVED NO GROWTH TO DATE CULTURE WILL BE HELD FOR 5 DAYS BEFORE ISSUING A FINAL NEGATIVE REPORT     Performed at [facility]   Report Status PENDING   Incomplete    Radiology Reports Ct Abdomen Pelvis Wo Contrast  02/18/2013   CLINICAL DATA:  Fever. Recent transurethral resection of bladder tumor. Extraluminal gas collection in the left side of the pelvis.   EXAM: CT ABDOMEN AND PELVIS WITHOUT CONTRAST  TECHNIQUE: Multidetector CT imaging of the abdomen and pelvis was performed following the standard protocol without intravenous contrast.  COMPARISON:  Radiograph dated 02/18/2013 and CT scan dated 08/30/2006  FINDINGS: There are bilateral extensive lower lobe consolidative pulmonary infiltrates. No effusions. Heart size is normal. Tiny hiatal hernia.  There is extensive extraluminal air in the soft tissues of the pelvis including the presacral space and along the left side of the pelvis along the fascia of the left antral operator muscle as well as extending anteriorly along the deep fascia of the rectus muscles. This is consistent with and anaerobic infection and possible necrotizing fasciitis.  The liver, biliary tree, spleen, pancreas, adrenal glands, and kidneys demonstrate no significant abnormality. The bowel appears normal. Foley catheter is in place in the bladder with a small amount of air in the bladder.  IMPRESSION: 1. Evidence consistent with anaerobic soft tissue infection in the pelvis with extension to the fascia of the rectus muscles and of the left internal obturator muscle consistent with necrotizing fasciitis. No visible pus collections in the pelvis. 2. Extensive bilateral lower lobe pneumonia.  Critical Value/emergent results were called by telephone at the time of interpretation on 02/18/2013 at 3:55 PM to Dr. Murray Hodgkins , who verbally acknowledged these results.   Electronically Signed   By: Rozetta Nunnery M.D.   On: 02/18/2013 15:56     US Renal  02/18/2013   CLINICAL DATA:  Acute renal failure  EXAM: RENAL/URINARY TRACT ULTRASOUND COMPLETE  COMPARISON:  01/13/2013  FINDINGS: Right Kidney:  Length: 11.2 cm.  No mass or hydronephrosis.  Left Kidney:  Length: 11.0 cm.  No mass or hydronephrosis.  Bladder:  Poorly visualized/decompressed by indwelling Foley catheter.  IMPRESSION: No hydronephrosis.  Bladder poorly visualized/decompressed by  indwelling Foley catheter.   Electronically Signed   By: Julian Hy M.D.   On: 02/18/2013 13:36      Dg Abd 2 Views  02/18/2013   CLINICAL DATA:  Bladder tumor, urothelial carcinoma, possible vomiting, post TURBT 02/13/2013  EXAM: ABDOMEN - 2 VIEW  COMPARISON:  None  FINDINGS: Bibasilar infiltrates question pneumonia versus aspiration.  Air-filled nondistended small bowel loops the mid  abdomen, question postoperative ileus.  Bubbly gas identified in the pelvis, could be related to stool within the distal colon but extending farther left lateral than is typically seen with rectal vault stool, raising question of extraperitoneal gas in the left pelvis.  Bones unremarkable.  No urinary tract calcification.  No definite free intraperitoneal air.  IMPRESSION: Bibasilar opacities question pneumonia versus aspiration.  Potential postoperative ileus.  Bubbly gas extending farther left laterally in the inferior pelvis than is typically seen with stool, question prominent stool versus extraperitoneal extraluminal gas in patient who has had a recent cystoscopy and TURBT ; CT imaging of the abdomen and pelvis with IV and oral contrast recommended.  Findings called to Dr. Sarajane Jews on 02/18/2013 at 1315 hr.   Electronically Signed   By: Lavonia Dana M.D.   On: 02/18/2013 13:16    CBC  Recent Labs Lab 02/21/13 0955 02/22/13 0805 02/23/13 0640 02/24/13 0542 02/25/13 0520  WBC 26.7* 27.6* 27.6* 21.9* 18.9*  HGB 10.9* 12.4* 11.7* 12.3* 10.4*  HCT 31.8* 36.2* 33.5* 36.3* 30.9*  PLT 238 266 253 289 285  MCV 70.2* 69.9* 68.9* 70.2* 69.8*  MCH 24.1* 23.9* 24.1* 23.8* 23.5*  MCHC 34.3 34.3 34.9 33.9 33.7  RDW 13.9 13.3 13.0 13.4 13.0    Chemistries   Recent Labs Lab 02/18/13 2032 02/19/13 0500 02/19/13 1230  02/20/13 0025  02/21/13 0312 02/22/13 0455 02/23/13 0640 02/24/13 0542 02/25/13 0520  NA 144 143 145  < >  --   < > 150* 147 143 141 139  K 3.8 4.5 4.4  < >  --   < > 3.9 3.4* 3.9 3.3*  3.5*  CL 112 109 113*  < >  --   < > 115* 109 105 101 101  CO2 20 19 21   < >  --   < > 24 27 29  32 28  GLUCOSE 109* 103* 122*  < >  --   < > 136* 104* 127* 92 115*  BUN 63* 51* 42*  < >  --   < > 40* 29* 22 16 16   CREATININE 1.41* 1.13 1.02  < >  --   < > 1.09 0.89 0.84 0.89 0.71  CALCIUM 7.2* 7.5* 7.9*  < >  --   < > 8.0* 8.1* 7.7* 7.8* 7.4*  MG  --  2.5 2.5  --  2.5  --   --   --   --   --   --   AST  --  43*  --   --   --   --   --   --   --   --   --   ALT  --  42  --   --   --   --   --   --   --   --   --   ALKPHOS  --  105  --   --   --   --   --   --   --   --   --   BILITOT  --  1.5*  --   --   --   --   --   --   --   --   --   < > = values in this interval not displayed.  Coagulation profile  Recent Labs Lab 02/18/13 2032  INR 1.19     Time Spent in minutes   35   York, Marianne L PA-C on  02/25/2013 at 2:40 PM  Between 7am to 7pm - Pager - [phone]  After 7pm go to [email or web address] - password [email or web address]  And look for the night coverage person covering for me after hours  [facility] Office  [phone]  Attending Seen and examined, agree with the above assessment and plan. Wound care being addressed by urology and general surgery. Continue with Unasyn, and follow CBC.  Nena Alexander MD

## 2013-02-25 NOTE — Progress Notes (Signed)
Pt is currently asleep and resting well.  Cpt/flutter was not done at this time.

## 2013-02-25 NOTE — Progress Notes (Signed)
Urology Inpatient Progress Report  ID: s/p TURBT for ~4cm bladder tumor on left lateral wall on 6/44/03, complicated by bladder perforation.  Subsequently presented to OSH after removing his catheter accidentally.  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  He is s/p ex. Lap/ pelvis washout/debridement, 02/19/13.      Intv/Subj: No acute events overnight. Patient is without complaint. Eating, denies nausea, moving bowels. Pain well controlled. JP removed yesterday. Switched to unasyn - No fevers   Past Medical History  Diagnosis Date  . Hypertension   . Stroke     Dementia, otherwise, no residual  . Bladder mass 02-10-13    surgery planned for this  . 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 COPD (chronic obstructive pulmonary disease)     previous history and current smoking  . 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 Dementia 02/18/2013   Current Facility-Administered Medications  Medication Dose Route Frequency Provider Last Rate Last Dose  . 0.9 %  sodium chloride infusion   Intravenous Continuous Leone Haven, MD      . acetaminophen (TYLENOL) tablet 650 mg  650 mg Oral Q6H PRN Samuella Cota, MD   650 mg at 02/18/13 1717   Or  . acetaminophen (TYLENOL) suppository 650 mg  650 mg Rectal Q6H PRN Samuella Cota, MD      . albuterol (PROVENTIL) (2.5 MG/3ML) 0.083% nebulizer solution 2.5 mg  2.5 mg Nebulization BID Renee A Kuneff, DO   2.5 mg at 02/24/13 1958  . Ampicillin-Sulbactam (UNASYN) 3 g in sodium chloride 0.9 % 100 mL IVPB  3 g Intravenous Q6H [name], MD   3 g at 02/25/13 0019  . antiseptic oral rinse (BIOTENE) solution 15 mL  15 mL Mouth Rinse QID Renee A Kuneff, DO   15 mL at 02/25/13 0020  . chlorhexidine (PERIDEX) 0.12 % solution 15 mL  15 mL Mouth Rinse BID Renee A Kuneff, DO   15 mL at 02/24/13 1950  . dextrose 5 % solution   Intravenous Continuous Renee A Kuneff, DO 60 mL/hr at 02/25/13 0027    .  feeding supplement (GLUCERNA SHAKE) (GLUCERNA SHAKE) liquid 237 mL  237 mL Oral BID BM Erlene Quan, RD   237 mL at 02/24/13 1455  . fentaNYL (SUBLIMAZE) 10 mcg/mL in sodium chloride 0.9 % 250 mL infusion  0-400 mcg/hr Intravenous Continuous Renee A Kuneff, DO   50 mcg/hr at 02/19/13 1800  . fentaNYL (SUBLIMAZE) bolus via infusion 25-50 mcg  25-50 mcg Intravenous Q1H PRN Renee A Kuneff, DO      . fentaNYL (SUBLIMAZE) injection 50 mcg  50 mcg Intravenous Q2H PRN Renee A Kuneff, DO   50 mcg at 02/21/13 1700  . folic acid (FOLVITE) tablet 1 mg  1 mg Oral Daily Thurnell Lose, MD   1 mg at 02/24/13 1116  . food thickener (THICK IT) powder   Oral PRN Thurnell Lose, MD      . heparin injection 5,000 Units  5,000 Units Subcutaneous 3 times per day Imogene Burn. Tsuei, MD   5,000 Units at 02/24/13 2220  . hydrocortisone sodium succinate (SOLU-CORTEF) 100 MG injection 25 mg  25 mg Intravenous Q12H Cherene Altes, MD   25 mg at 02/24/13 1759  . insulin aspart (novoLOG) injection 0-9 Units  0-9 Units Subcutaneous 6 times per day Raylene Miyamoto, MD   1 Units at 02/25/13 0020  . metoprolol (LOPRESSOR)  injection 2.5-5 mg  2.5-5 mg Intravenous Q3H PRN Colbert Coyer, MD      . multivitamin with minerals tablet 1 tablet  1 tablet Oral Daily Erlene Quan, RD   1 tablet at 02/24/13 1117  . norepinephrine (LEVOPHED) 4 mg in dextrose 5 % 250 mL infusion  2-50 mcg/min Intravenous Titrated Renee A Kuneff, DO      . ondansetron (ZOFRAN) tablet 4 mg  4 mg Oral Q6H PRN Samuella Cota, MD       Or  . ondansetron [facility]) injection 4 mg  4 mg Intravenous Q6H PRN Samuella Cota, MD      . pantoprazole (PROTONIX) EC tablet 40 mg  40 mg Oral Q1200 Thurnell Lose, MD   40 mg at 02/24/13 1145  . phenylephrine (NEO-SYNEPHRINE) 10 mg in dextrose 5 % 250 mL infusion  30-200 mcg/min Intravenous Titrated Juanito Doom, MD      . sodium chloride 0.9 % bolus 1,000 mL  1,000 mL Intravenous Once Renee  A Kuneff, DO      . sodium chloride 0.9 % injection 3 mL  3 mL Intravenous Q12H Samuella Cota, MD   3 mL at 02/24/13 2220  . thiamine (VITAMIN B-1) tablet 100 mg  100 mg Oral Daily Thurnell Lose, MD   100 mg at 02/24/13 1117    Objective: Vital: Filed Vitals:   02/24/13 1324 02/24/13 1958 02/24/13 2049 02/25/13 0351  BP: 100/61  104/58 111/53  Pulse: 83  80 74  Temp: 97.3 F (36.3 C)  98.3 F (36.8 C) 97.9 F (36.6 C)  TempSrc: Oral  Oral Oral  Resp: 18  18 18   Height:      Weight:      SpO2: 93% 95% 97% 92%    I/Os: I/O last 3 completed shifts: In: [id number] [P.O.:702; I.V.:2976; IV Piggyback:450] Out: 3060 [Urine:2950; Drains:110]  Physical Exam:  General: Patient is in no apparent distress Lungs: coarse breath sounds GI: The abdomen is soft a, nontender.  Midline wound opened/packed and starting to granulate.  He has two penrose drains on either side of his pelvis, the right one appears to be draining purulent effluent, left penrose with clean dressing. Foley is draining clear yellow urine. Ext: lower extremities symmetric  Lab Results:  Recent Labs  02/22/13 0805 02/23/13 0640 02/24/13 0542  WBC 27.6* 27.6* 21.9*  HGB 12.4* 11.7* 12.3*  HCT 36.2* 33.5* 36.3*    Recent Labs  02/22/13 0455 02/23/13 0640 02/24/13 0542  NA 147 143 141  K 3.4* 3.9 3.3*  CL 109 105 101  CO2 27 29 32  GLUCOSE 104* 127* 92  BUN 29* 22 16  CREATININE 0.89 0.84 0.89  CALCIUM 8.1* 7.7* 7.8*   No results found for this basename: LABPT, INR,  in the last 72 hours No results found for this basename: LABURIN,  in the last 72 hours Results for orders placed during the hospital encounter of 02/18/13  URINE CULTURE     Status: None   Collection Time    02/18/13  7:20 AM      Result Value Ref Range Status   Specimen Description URINE, CLEAN CATCH   Final   Special Requests NONE   Final   Culture  Setup Time     Final   Value: 02/18/2013 10:26     Performed at [place]     Final   Value: >=100,000 COLONIES/ML  Performed at [facility]     Final   Value: ESCHERICHIA COLI     Performed at [facility]   Report Status 02/22/2013 FINAL   Final   Organism ID, Bacteria ESCHERICHIA COLI   Final  CULTURE, BLOOD (ROUTINE X 2)     Status: None   Collection Time    02/18/13  8:15 AM      Result Value Ref Range Status   Specimen Description BLOOD SITE NOT SPECIFIED DRAWN BY RN   Final   Special Requests BOTTLES DRAWN AEROBIC AND ANAEROBIC 4CC   Final   Culture NO GROWTH 5 DAYS   Final   Report Status 02/23/2013 FINAL   Final  CULTURE, BLOOD (ROUTINE X 2)     Status: None   Collection Time    02/18/13  8:30 AM      Result Value Ref Range Status   Specimen Description BLOOD RIGHT ARM   Final   Special Requests BOTTLES DRAWN AEROBIC ONLY [facility]   Final   Culture  Setup Time     Final   Value: 02/20/2013 01:21     Performed at [facility]   Culture     Final   Value: ESCHERICHIA COLI     1822 Note: Gram Stain Report Called to,Read Back By and Verified With: Girard Cooter 02/19/2013 BAUGHAM Performed at [facility]     Performed at [facility]   Report Status 02/22/2013 FINAL   Final   Organism ID, Bacteria ESCHERICHIA COLI   Final  MRSA PCR SCREENING     Status: None   Collection Time    02/18/13 10:13 AM      Result Value Ref Range Status   MRSA by PCR NEGATIVE  NEGATIVE Final   Comment:            The GeneXpert MRSA Assay (FDA     approved for NASAL specimens     only), is one component of a     comprehensive MRSA colonization     surveillance program. It is not     intended to diagnose MRSA     infection nor to guide or     monitor treatment for     MRSA infections.  AFB CULTURE WITH SMEAR     Status: None   Collection Time    02/19/13 12:28 AM      Result Value Ref Range Status   Specimen Description ABSCESS ABDOMEN   Final   Special Requests PRE PERITONEAL   Final    ACID FAST SMEAR     Final   Value: NO ACID FAST BACILLI SEEN     Performed at [facility]   Culture     Final   Value: CULTURE WILL BE EXAMINED FOR 6 WEEKS BEFORE ISSUING A FINAL REPORT     Performed at [facility]   Report Status PENDING   Incomplete  ANAEROBIC CULTURE     Status: None   Collection Time    02/19/13 12:30 AM      Result Value Ref Range Status   Specimen Description ABSCESS ABDOMEN   Final   Special Requests PRE PERITONEAL   Final   Gram Stain     Final   Value: ABUNDANT WBC PRESENT,BOTH PMN AND MONONUCLEAR     NO SQUAMOUS EPITHELIAL CELLS SEEN     MODERATE GRAM NEGATIVE RODS     Performed at [facility]   Culture     Final   Value: NO ANAEROBES ISOLATED     Performed at [facility]   Report Status 02/24/2013 FINAL   Final  CULTURE, ROUTINE-ABSCESS     Status: None   Collection Time    02/19/13 12:31 AM      Result Value Ref Range Status   Specimen Description ABSCESS ABDOMEN   Final   Special Requests PRE PERITONEAL   Final   Gram Stain     Final   Value: ABUNDANT WBC PRESENT,BOTH PMN AND MONONUCLEAR     NO SQUAMOUS EPITHELIAL CELLS SEEN     MODERATE GRAM NEGATIVE RODS     Performed at [facility]   Culture     Final   Value: MODERATE ESCHERICHIA COLI     Performed at [facility]   Report Status 02/21/2013 FINAL   Final   Organism ID, Bacteria ESCHERICHIA COLI   Final  FUNGUS CULTURE W SMEAR     Status: None   Collection Time    02/19/13 12:33 AM      Result Value Ref Range Status   Specimen Description ABSCESS ABDOMEN   Final   Special Requests PRE PERITONEAL   Final   Fungal Smear     Final   Value: NO YEAST OR FUNGAL ELEMENTS SEEN     Performed at [facility]   Culture     Final   Value: CULTURE IN PROGRESS FOR FOUR WEEKS     Performed at [facility]   Report Status PENDING   Incomplete  CULTURE, RESPIRATORY (NON-EXPECTORATED)     Status: None   Collection Time     02/19/13  5:38 AM      Result Value Ref Range Status   Specimen Description TRACHEAL ASPIRATE   Final   Special Requests NONE   Final   Gram Stain     Final   Value: NO WBC SEEN     NO SQUAMOUS EPITHELIAL CELLS SEEN     NO ORGANISMS SEEN     Performed at [facility]   Culture     Final   Value: Non-Pathogenic Oropharyngeal-type Flora Isolated.     Performed at [facility]   Report Status 02/21/2013 FINAL   Final  CULTURE, RESPIRATORY (NON-EXPECTORATED)     Status: None   Collection Time    02/19/13  9:53 AM      Result Value Ref Range Status   Specimen Description TRACHEAL ASPIRATE   Final   Special Requests NONE   Final   Gram Stain     Final   Value: FEW WBC PRESENT, PREDOMINANTLY PMN     RARE SQUAMOUS EPITHELIAL CELLS PRESENT     RARE GRAM POSITIVE COCCI     IN PAIRS     Performed at [facility]   Culture     Final   Value: Non-Pathogenic Oropharyngeal-type Flora Isolated.     Performed at [facility]   Report Status 02/21/2013 FINAL   Final    Assessment: 7 Days Post-Op from pelvic exploration/washout, improving and making great progress.  E.coli grown from pelvic fluid cultures, blood and urine, resistant to Cipro.  Elevated WBC likely from both PNA and pelvic infection.   Plan: Plan to leave the foley catheter in for at least 2 weeks from time of exploration.  As long as he is improving, we will hold off on any imaging.  May start to back out penrose drains starting tomorrow.  The remaining care per IM, ID and Gen Surg., I really appreciate their help/input!  Ardis Hughs 02/25/2013, 4:33 AM

## 2013-02-25 NOTE — Clinical Social Work Placement (Signed)
Clinical Social Work Department CLINICAL SOCIAL WORK PLACEMENT NOTE 02/25/2013  Patient:  Jason [name], Jason [name]  Account Number:  [id number] [place] date:  02/18/2013  Clinical Social Worker:  Kemper Durie, [place]  Date/time:  02/23/2013 01:30 PM  Clinical Social Work is seeking post-discharge placement for this patient at the following level of care:   [place]   (*CSW will update this form in Epic as items are completed)   02/23/2013  Patient/family provided with [facility] Department of Clinical Social Work's list of facilities offering this level of care within the geographic area requested by the patient (or if unable, by the patient's family).  02/23/2013  Patient/family informed of their freedom to choose among providers that offer the needed level of care, that participate in Medicare, Medicaid or managed care program needed by the patient, have an available bed and are willing to accept the patient.  02/23/2013  Patient/family informed of [facility]' ownership interest in [facility], as well as of the fact that they are under no obligation to receive care at this facility.  PASARR submitted to [employer] on  PASARR number received from [place] on   FL2 transmitted to all facilities in geographic area requested by pt/family on  02/24/2013 FL2 transmitted to all facilities within larger geographic area on   Patient informed that his/her managed care company has contracts with or will negotiate with  certain facilities, including the following:     Patient/family informed of bed offers received:  02/25/2013 Patient chooses bed at  Physician recommends and patient chooses bed at    Patient to be transferred to  on   Patient to be transferred to facility by   The following physician request were entered in Epic:   Additional Comments:   Liz Beach, [place], [phone]

## 2013-02-25 NOTE — Clinical Social Work Note (Signed)
CSW has made several attempts to reach the daughter of the patient to complete assessment. All family members have stated that they do not know the number for the daughter.  Patient's brother stated that he will try to find out the daughter's number and give CSW a call back.  Liz Beach, [place], [place], [phone]

## 2013-02-25 NOTE — Progress Notes (Signed)
7 Days Post-Op  Subjective: Patient feels fine WBC decreasing  Objective: Vital signs in last 24 hours: Temp:  [97.3 F (36.3 C)-98.3 F (36.8 C)] 97.9 F (36.6 C) (02/25 0351) Pulse Rate:  [74-83] 74 (02/25 0351) Resp:  [18] 18 (02/25 0351) BP: (100-111)/(53-61) 111/53 mmHg (02/25 0351) SpO2:  [92 %-97 %] 92 % (02/25 0351) Weight:  [154 lb 15.7 oz (70.3 kg)] 154 lb 15.7 oz (70.3 kg) (02/25 0351) Last BM Date: 02/24/13  Intake/Output from previous day: 02/24 0701 - 02/25 0700 In: 1502 [P.O.:582; I.V.:720; IV Piggyback:200] Out: 1550 [Urine:1550] Intake/Output this shift:    Wound - clean; beginning to granulate  Lab Results:   Recent Labs  02/24/13 0542 02/25/13 0520  WBC 21.9* 18.9*  HGB 12.3* 10.4*  HCT 36.3* 30.9*  PLT 289 285   BMET  Recent Labs  02/24/13 0542 02/25/13 0520  NA 141 139  K 3.3* 3.5*  CL 101 101  CO2 32 28  GLUCOSE 92 115*  BUN 16 16  CREATININE 0.89 0.71  CALCIUM 7.8* 7.4*   PT/INR No results found for this basename: LABPROT, INR,  in the last 72 hours ABG No results found for this basename: PHART, PCO2, PO2, HCO3,  in the last 72 hours  Studies/Results: Dg Swallowing Func-speech Pathology  02/23/2013   Katherene Ponto [place], CCC-SLP     02/23/2013 10:49 AM Objective Swallowing Evaluation: Modified Barium Swallowing Study   Patient Details  Name: Jason [name] MRN: [id number] Date of Birth: 1945/05/11  Today's Date: 02/23/2013 Time: [id number] SLP Time Calculation (min): 20 min  Past Medical History:  Past Medical History  Diagnosis Date  . Hypertension   . Stroke     Dementia, otherwise, no residual  . Bladder mass 02-10-13    surgery planned for this  . 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 COPD (chronic obstructive pulmonary disease)     previous history and current smoking  . 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 Dementia 02/18/2013   Past Surgical History:  Past Surgical History  Procedure Laterality Date  .  Transurethral resection of bladder tumor with gyrus  (turbt-gyrus) N/A 02/13/2013    Procedure: TRANSURETHRAL RESECTION OF BLADDER TUMOR WITH GYRUS  (TURBT-GYRUS), bladder biopsies;  Surgeon: Ardis Hughs,  MD;  Location: [facility];  Service: Urology;  Laterality: N/A;  . Cystoscopy/retrograde/ureteroscopy Bilateral 02/13/2013    Procedure: BILATERAL RETROGRADE;  Surgeon: Ardis Hughs,  MD;  Location: [facility];  Service: Urology;  Laterality: Bilateral;   . Laparotomy N/A 02/18/2013    Procedure: EXPLORATORY LAPAROTOMY drainage of retroperitoneal  abscess, drainage of Pre-Peritoneal abscess and Exploration of  bladder perforation;  Surgeon: Imogene Burn. Bronsyn Shappell, MD;  Location:  [facility];  Service: General;  Laterality: N/A;   HPI:  Severe Sepsis with possible necrotizing fasciitis of the rectus  muscle and left internal obturator , also suspected HCAP -  currently hemodynamically stable, status post exploratory  laparotomy and drainage by general surgery and urology on  02/18/2013, status post intubation and extubation was extubated  on 02/20/2013.      Assessment / Plan / Recommendation Clinical Impression  Clinical impression: Pt demonstrates moderate sensory impairment  impacting safety with large straw sips or with pills given with  liquids. Pts oral phase is characterized by slow pumping and  prolonged mastication of solids, poor posterior containment with  straw sips and struggle to transit pill. Pharyngeal phase is  strong, but there is decreased coordination  and timing with straw  sips allowing for silent aspiration during/after the swallow. The  pt is recommended to consume regular solids and cup sips (may be  moderate to large without severe risk), but should not be given  straws and pills must be given whole in puree as there is  significant aspiration of liquids with pills. Fully upright  posture also very important. Recommend OOB if possible. SLP will  follow for tolerance.     Treatment Recommendation   Therapy as outlined in treatment plan below    Diet Recommendation Regular;Thin liquid   Liquid Administration via: No straw;Cup Medication Administration: Whole meds with puree Supervision: Staff to assist with self feeding;Full  supervision/cueing for compensatory strategies Compensations: Slow rate Postural Changes and/or Swallow Maneuvers: Seated upright 90  degrees;Out of bed for meals    Other  Recommendations Oral Care Recommendations: Oral care BID Other Recommendations: Have oral suction available   Follow Up Recommendations  Skilled Nursing facility    Frequency and Duration min 2x/week  2 weeks   Pertinent Vitals/Pain NA    SLP Swallow Goals     General HPI: Severe Sepsis with possible necrotizing fasciitis of  the rectus muscle and left internal obturator , also suspected  HCAP - currently hemodynamically stable, status post exploratory  laparotomy and drainage by general surgery and urology on  02/18/2013, status post intubation and extubation was extubated  on 02/20/2013.  Type of Study: Modified Barium Swallowing Study Reason for Referral: Objectively evaluate swallowing function Diet Prior to this Study: Dysphagia 3 (soft);Nectar-thick liquids Temperature Spikes Noted: No Respiratory Status: Nasal cannula History of Recent Intubation: Yes Length of Intubations (days): 2 days Date extubated: 02/20/13 Behavior/Cognition: Alert;Cooperative;Confused;Decreased  sustained attention Oral Cavity - Dentition: Poor condition;Missing dentition Oral Motor / Sensory Function: Within functional limits Self-Feeding Abilities: Able to feed self Patient Positioning: Upright in chair Baseline Vocal Quality: Hoarse Volitional Cough: Other (Comment);Weak (requires max cues) Volitional Swallow:  (NT) Anatomy: Within functional limits Pharyngeal Secretions:  (probable standing secretions based on  vocal quality)    Reason for Referral Objectively evaluate swallowing function   Oral Phase Oral Preparation/Oral Phase Oral  Phase: Impaired Oral - Thin Oral - Thin Cup: Lingual/palatal residue Oral - Thin Straw: Lingual/palatal residue;Piecemeal swallowing Oral - Solids Oral - Puree: Lingual/palatal residue;Delayed oral  transit;Reduced posterior propulsion;Lingual pumping Oral - Regular: Reduced posterior propulsion;Delayed oral transit Oral - Pill: Other (Comment) (dramatic posterior head tilt needed  to transit pills)   Pharyngeal Phase Pharyngeal Phase Pharyngeal Phase: Impaired Pharyngeal - Thin Pharyngeal - Thin Cup: Premature spillage to pyriform  sinuses;Pharyngeal residue - valleculae;Pharyngeal residue -  pyriform sinuses (independent second swallow to clear) Pharyngeal - Thin Straw: Premature spillage to pyriform  sinuses;Penetration/Aspiration after swallow;Moderate aspiration Penetration/Aspiration details (thin straw): Material enters  airway, passes BELOW cords without attempt by patient to eject  out (silent aspiration);Material does not enter airway Pharyngeal - Solids Pharyngeal - Puree: Delayed swallow initiation;Premature spillage  to valleculae Pharyngeal - Regular: Delayed swallow initiation;Premature  spillage to valleculae Pharyngeal - Pill: Delayed swallow  initiation;Penetration/Aspiration before swallow;Moderate  aspiration Penetration/Aspiration details (pill): Material enters airway,  passes BELOW cords without attempt by patient to eject out  (silent aspiration)  Cervical Esophageal Phase    GO    Cervical Esophageal Phase Cervical Esophageal Phase: [facility]         DeBlois, Katherene Ponto 02/23/2013, 10:48 AM     Anti-infectives: Anti-infectives   Start     Dose/Rate Route Frequency Ordered  Stop   02/23/13 1200  Ampicillin-Sulbactam (UNASYN) 3 g in sodium chloride 0.9 % 100 mL IVPB     3 g 100 mL/hr over 60 Minutes Intravenous Every 6 hours 02/23/13 1109     02/22/13 1600  piperacillin-tazobactam (ZOSYN) IVPB 3.375 g  Status:  Discontinued     3.375 g 12.5 mL/hr over 240 Minutes Intravenous Every 8  hours 02/22/13 1516 02/23/13 1109   02/22/13 1200  vancomycin (VANCOCIN) IVPB 1000 mg/200 mL premix  Status:  Discontinued     1,000 mg 200 mL/hr over 60 Minutes Intravenous Every 12 hours 02/22/13 1111 02/23/13 1109   02/19/13 2300  vancomycin (VANCOCIN) IVPB 750 mg/150 ml premix  Status:  Discontinued     750 mg 150 mL/hr over 60 Minutes Intravenous Every 12 hours 02/19/13 1436 02/20/13 0943   02/19/13 1500  meropenem (MERREM) 1 g in sodium chloride 0.9 % 100 mL IVPB  Status:  Discontinued     1 g 200 mL/hr over 30 Minutes Intravenous 3 times per day 02/19/13 1436 02/22/13 1455   02/19/13 1000  vancomycin (VANCOCIN) IVPB 750 mg/150 ml premix  Status:  Discontinued     750 mg 150 mL/hr over 60 Minutes Intravenous Every 24 hours 02/18/13 1031 02/19/13 1437   02/18/13 1800  clindamycin (CLEOCIN) IVPB 900 mg  Status:  Discontinued     900 mg 100 mL/hr over 30 Minutes Intravenous 4 times per day 02/18/13 1626 02/20/13 0943   02/18/13 1800  meropenem (MERREM) 500 mg in sodium chloride 0.9 % 50 mL IVPB  Status:  Discontinued     500 mg 100 mL/hr over 30 Minutes Intravenous Every 12 hours 02/18/13 1649 02/19/13 1436   02/18/13 1200  piperacillin-tazobactam (ZOSYN) IVPB 2.25 g  Status:  Discontinued     2.25 g 100 mL/hr over 30 Minutes Intravenous 4 times per day 02/18/13 1026 02/18/13 1626   02/18/13 1100  vancomycin (VANCOCIN) 1,500 mg in sodium chloride 0.9 % 500 mL IVPB     1,500 mg 250 mL/hr over 120 Minutes Intravenous  Once 02/18/13 1026 02/18/13 1300   02/18/13 0800  cefTRIAXone (ROCEPHIN) 1 g in dextrose 5 % 50 mL IVPB     1 g Intravenous  Once 02/18/13 0752 02/18/13 0848      Assessment/Plan: s/p Procedure(s): EXPLORATORY LAPAROTOMY drainage of retroperitoneal abscess, drainage of Pre-Peritoneal abscess and Exploration of bladder perforation (N/A) Continue foley due to bladder perforation Dressing changes   LOS: 7 days    Jonia Oakey K. 02/25/2013

## 2013-02-25 NOTE — Progress Notes (Signed)
Pt demonstrated he was able to use flutter on his own. RT placed at bedside and instructed to use Q4 while awake. Encouraged to call RT if needing assistance.

## 2013-02-25 NOTE — Progress Notes (Signed)
Pt is asleep, flutter not done at this time.

## 2013-02-26 LAB — CBC
HCT: 31.7 % — ABNORMAL LOW (ref 39.0–52.0)
HEMOGLOBIN: 10.7 g/dL — AB (ref 13.0–17.0)
MCH: 23.5 pg — ABNORMAL LOW (ref 26.0–34.0)
MCHC: 33.8 g/dL (ref 30.0–36.0)
MCV: 69.5 fL — AB (ref 78.0–100.0)
PLATELETS: 308 10*3/uL (ref 150–400)
RBC: 4.56 MIL/uL (ref 4.22–5.81)
RDW: 13.3 % (ref 11.5–15.5)
WBC: 16.5 10*3/uL — AB (ref 4.0–10.5)

## 2013-02-26 LAB — GLUCOSE, CAPILLARY
GLUCOSE-CAPILLARY: 121 mg/dL — AB (ref 70–99)
GLUCOSE-CAPILLARY: 139 mg/dL — AB (ref 70–99)
GLUCOSE-CAPILLARY: 210 mg/dL — AB (ref 70–99)
GLUCOSE-CAPILLARY: 95 mg/dL (ref 70–99)
Glucose-Capillary: 130 mg/dL — ABNORMAL HIGH (ref 70–99)
Glucose-Capillary: 118 mg/dL — ABNORMAL HIGH (ref 70–99)

## 2013-02-26 LAB — BASIC METABOLIC PANEL
BUN: 12 mg/dL (ref 6–23)
CHLORIDE: 102 meq/L (ref 96–112)
CO2: 30 meq/L (ref 19–32)
CREATININE: 0.76 mg/dL (ref 0.50–1.35)
Calcium: 7.4 mg/dL — ABNORMAL LOW (ref 8.4–10.5)
GFR calc Af Amer: >90 mL/min (ref >90)
GFR calc non Af Amer: >90 mL/min (ref >90)
Glucose, Bld: 126 mg/dL — ABNORMAL HIGH (ref 70–99)
Potassium: 3.4 mEq/L — ABNORMAL LOW (ref 3.7–5.3)
Sodium: 140 mEq/L (ref 137–147)

## 2013-02-26 MED ORDER — SODIUM CHLORIDE 0.9 % IJ SOLN
10.0000 mL | INTRAMUSCULAR | Status: DC | PRN
  Administered 2013-02-26 – 2013-03-02 (×7): 10 mL

## 2013-02-26 MED ORDER — POTASSIUM CHLORIDE CRYS ER 20 MEQ PO TBCR
40.0000 meq | EXTENDED_RELEASE_TABLET | Freq: Once | ORAL | Status: AC
  Administered 2013-02-26: 40 meq via ORAL

## 2013-02-26 MED ORDER — CHLORPROMAZINE HCL 25 MG PO TABS
25.0000 mg | ORAL_TABLET | Freq: Four times a day (QID) | ORAL | Status: DC
  Administered 2013-02-26 – 2013-02-27 (×4): 25 mg via ORAL
  Filled 2013-02-26 (×7): qty 1

## 2013-02-26 NOTE — Progress Notes (Signed)
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  Subsequently presented to OSH after removing his catheter accidentally.  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  He is s/p ex. Lap/ pelvis washout/debridement, 02/19/13.      Intv/Subj: No acute events overnight. Patient is without complaint. Eating, denies nausea, moving bowels. Pain well controlled. PICC being placed today    Past Medical History  Diagnosis Date  . Hypertension   . Stroke     Dementia, otherwise, no residual  . Bladder mass 02-10-13    surgery planned for this  . 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 COPD (chronic obstructive pulmonary disease)     previous history and current smoking  . 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 Dementia 02/18/2013   Current Facility-Administered Medications  Medication Dose Route Frequency Provider Last Rate Last Dose  . 0.9 %  sodium chloride infusion   Intravenous Continuous Leone Haven, MD      . acetaminophen (TYLENOL) tablet 650 mg  650 mg Oral Q6H PRN Samuella Cota, MD   650 mg at 02/18/13 1717   Or  . acetaminophen (TYLENOL) suppository 650 mg  650 mg Rectal Q6H PRN Samuella Cota, MD      . albuterol (PROVENTIL) (2.5 MG/3ML) 0.083% nebulizer solution 2.5 mg  2.5 mg Nebulization BID Renee A Kuneff, DO   2.5 mg at 02/26/13 4259  . Ampicillin-Sulbactam (UNASYN) 3 g in sodium chloride 0.9 % 100 mL IVPB  3 g Intravenous Q6H [name], MD   3 g at 02/26/13 1210  . antiseptic oral rinse (BIOTENE) solution 15 mL  15 mL Mouth Rinse QID Renee A Kuneff, DO   15 mL at 02/26/13 1211  . chlorhexidine (PERIDEX) 0.12 % solution 15 mL  15 mL Mouth Rinse BID Renee A Kuneff, DO   15 mL at 02/26/13 1029  . chlorproMAZINE (THORAZINE) tablet 25 mg  25 mg Oral QID Melton Alar, PA-C      . dextrose 5 % solution   Intravenous  Continuous Renee A Kuneff, DO 60 mL/hr at 02/26/13 1026 60 mL at 02/26/13 1026  . feeding supplement (GLUCERNA SHAKE) (GLUCERNA SHAKE) liquid 237 mL  237 mL Oral BID BM Erlene Quan, RD   237 mL at 02/26/13 1315  . fentaNYL (SUBLIMAZE) 10 mcg/mL in sodium chloride 0.9 % 250 mL infusion  0-400 mcg/hr Intravenous Continuous Renee A Kuneff, DO   50 mcg/hr at 02/19/13 1800  . fentaNYL (SUBLIMAZE) bolus via infusion 25-50 mcg  25-50 mcg Intravenous Q1H PRN Renee A Kuneff, DO      . fentaNYL (SUBLIMAZE) injection 50 mcg  50 mcg Intravenous Q2H PRN Renee A Kuneff, DO   50 mcg at 02/21/13 1700  . folic acid (FOLVITE) tablet 1 mg  1 mg Oral Daily Thurnell Lose, MD   1 mg at 02/26/13 1027  . food thickener (THICK IT) powder   Oral PRN Thurnell Lose, MD      . heparin injection 5,000 Units  5,000 Units Subcutaneous 3 times per day Imogene Burn. Tsuei, MD   5,000 Units at 02/26/13 1315  . hydrocortisone sodium succinate (SOLU-CORTEF) 100 MG injection 25 mg  25 mg Intravenous Q12H Cherene Altes, MD   25 mg at 02/26/13 [phone]  . insulin aspart (novoLOG) injection 0-9 Units  0-9 Units Subcutaneous  6 times per day Raylene Miyamoto, MD   1 Units at 02/26/13 1214  . iron polysaccharides (NIFEREX) capsule 150 mg  150 mg Oral Daily Melton Alar, PA-C   150 mg at 02/26/13 1027  . metoprolol (LOPRESSOR) injection 2.5-5 mg  2.5-5 mg Intravenous Q3H PRN Colbert Coyer, MD      . multivitamin with minerals tablet 1 tablet  1 tablet Oral Daily Erlene Quan, RD   1 tablet at 02/26/13 1026  . norepinephrine (LEVOPHED) 4 mg in dextrose 5 % 250 mL infusion  2-50 mcg/min Intravenous Titrated Renee A Kuneff, DO      . ondansetron (ZOFRAN) tablet 4 mg  4 mg Oral Q6H PRN Samuella Cota, MD       Or  . ondansetron [facility]) injection 4 mg  4 mg Intravenous Q6H PRN Samuella Cota, MD      . pantoprazole (PROTONIX) EC tablet 40 mg  40 mg Oral Q1200 Thurnell Lose, MD   40 mg at 02/26/13 1215  .  phenylephrine (NEO-SYNEPHRINE) 10 mg in dextrose 5 % 250 mL infusion  30-200 mcg/min Intravenous Titrated Juanito Doom, MD      . potassium chloride SA (K-DUR,KLOR-CON) CR tablet 40 mEq  40 mEq Oral Once Melton Alar, PA-C      . sodium chloride 0.9 % bolus 1,000 mL  1,000 mL Intravenous Once Renee A Kuneff, DO      . sodium chloride 0.9 % injection 10-40 mL  10-40 mL Intracatheter PRN Jonetta Osgood, MD   10 mL at 02/26/13 1240  . sodium chloride 0.9 % injection 3 mL  3 mL Intravenous Q12H Samuella Cota, MD   3 mL at 02/26/13 1031  . thiamine (VITAMIN B-1) tablet 100 mg  100 mg Oral Daily Thurnell Lose, MD   100 mg at 02/26/13 1027    Objective: Vital: Filed Vitals:   02/25/13 2023 02/26/13 0354 02/26/13 1139 02/26/13 1336  BP: 119/61 108/68  127/64  Pulse: 88 83  86  Temp: 99 F (37.2 C) 99.4 F (37.4 C)  97.9 F (36.6 C)  TempSrc: Oral Oral  Oral  Resp: 18 18  18   Height:      Weight:  71.4 kg (157 lb 6.5 oz)    SpO2: 91% 92% 97% 100%    I/Os: I/O last 3 completed shifts: In: 2412 [I.V.:2212; IV Piggyback:200] Out: 2125 [Urine:2125]  Physical Exam:  General: Patient is in no apparent distress Lungs: coarse breath sounds GI: The abdomen is soft a, nontender.  Midline wound opened/packed and starting to granulate.  He has two penrose drains on either side of his pelvis, both with purulent drainage. Foley is draining clear yellow urine. Ext: lower extremities symmetric  Lab Results:  Recent Labs  02/24/13 0542 02/25/13 0520 02/26/13 0438  WBC 21.9* 18.9* 16.5*  HGB 12.3* 10.4* 10.7*  HCT 36.3* 30.9* 31.7*    Recent Labs  02/24/13 0542 02/25/13 0520 02/26/13 0438  NA 141 139 140  K 3.3* 3.5* 3.4*  CL 101 101 102  CO2 32 28 30  GLUCOSE 92 115* 126*  BUN 16 16 12   CREATININE 0.89 0.71 0.76  CALCIUM 7.8* 7.4* 7.4*   No results found for this basename: LABPT, INR,  in the last 72 hours No results found for this basename: LABURIN,  in the  last 72 hours Results for orders placed during the hospital encounter of 02/18/13  URINE CULTURE  Status: None   Collection Time    02/18/13  7:20 AM      Result Value Ref Range Status   Specimen Description URINE, CLEAN CATCH   Final   Special Requests NONE   Final   Culture  Setup Time     Final   Value: 02/18/2013 10:26     Performed at [place]     Final   Value: >=100,000 COLONIES/ML     Performed at [facility]   Culture     Final   Value: ESCHERICHIA COLI     Performed at [facility]   Report Status 02/22/2013 FINAL   Final   Organism ID, Bacteria ESCHERICHIA COLI   Final  CULTURE, BLOOD (ROUTINE X 2)     Status: None   Collection Time    02/18/13  8:15 AM      Result Value Ref Range Status   Specimen Description BLOOD SITE NOT SPECIFIED DRAWN BY RN   Final   Special Requests BOTTLES DRAWN AEROBIC AND ANAEROBIC 4CC   Final   Culture NO GROWTH 5 DAYS   Final   Report Status 02/23/2013 FINAL   Final  CULTURE, BLOOD (ROUTINE X 2)     Status: None   Collection Time    02/18/13  8:30 AM      Result Value Ref Range Status   Specimen Description BLOOD RIGHT ARM   Final   Special Requests BOTTLES DRAWN AEROBIC ONLY [facility]   Final   Culture  Setup Time     Final   Value: 02/20/2013 01:21     Performed at [facility]   Culture     Final   Value: ESCHERICHIA COLI     1822 Note: Gram Stain Report Called to,Read Back By and Verified With: Girard Cooter 02/19/2013 [place] Performed at [facility]     Performed at [facility]   Report Status 02/22/2013 FINAL   Final   Organism ID, Bacteria ESCHERICHIA COLI   Final  MRSA PCR SCREENING     Status: None   Collection Time    02/18/13 10:13 AM      Result Value Ref Range Status   MRSA by PCR NEGATIVE  NEGATIVE Final   Comment:            The GeneXpert MRSA Assay (FDA     approved for NASAL specimens     only), is one component of a     comprehensive MRSA  colonization     surveillance program. It is not     intended to diagnose MRSA     infection nor to guide or     monitor treatment for     MRSA infections.  AFB CULTURE WITH SMEAR     Status: None   Collection Time    02/19/13 12:28 AM      Result Value Ref Range Status   Specimen Description ABSCESS ABDOMEN   Final   Special Requests PRE PERITONEAL   Final   ACID FAST SMEAR     Final   Value: NO ACID FAST BACILLI SEEN     Performed at [facility]   Culture     Final   Value: CULTURE WILL BE EXAMINED FOR 6 WEEKS BEFORE ISSUING A FINAL REPORT     Performed at [facility]   Report Status PENDING   Incomplete  ANAEROBIC CULTURE  Status: None   Collection Time    02/19/13 12:30 AM      Result Value Ref Range Status   Specimen Description ABSCESS ABDOMEN   Final   Special Requests PRE PERITONEAL   Final   Gram Stain     Final   Value: ABUNDANT WBC PRESENT,BOTH PMN AND MONONUCLEAR     NO SQUAMOUS EPITHELIAL CELLS SEEN     MODERATE GRAM NEGATIVE RODS     Performed at [facility]   Culture     Final   Value: NO ANAEROBES ISOLATED     Performed at [facility]   Report Status 02/24/2013 FINAL   Final  CULTURE, ROUTINE-ABSCESS     Status: None   Collection Time    02/19/13 12:31 AM      Result Value Ref Range Status   Specimen Description ABSCESS ABDOMEN   Final   Special Requests PRE PERITONEAL   Final   Gram Stain     Final   Value: ABUNDANT WBC PRESENT,BOTH PMN AND MONONUCLEAR     NO SQUAMOUS EPITHELIAL CELLS SEEN     MODERATE GRAM NEGATIVE RODS     Performed at [facility]   Culture     Final   Value: MODERATE ESCHERICHIA COLI     Performed at [facility]   Report Status 02/21/2013 FINAL   Final   Organism ID, Bacteria ESCHERICHIA COLI   Final  FUNGUS CULTURE W SMEAR     Status: None   Collection Time    02/19/13 12:33 AM      Result Value Ref Range Status   Specimen Description ABSCESS ABDOMEN   Final   Special  Requests PRE PERITONEAL   Final   Fungal Smear     Final   Value: NO YEAST OR FUNGAL ELEMENTS SEEN     Performed at [facility]   Culture     Final   Value: CULTURE IN PROGRESS FOR FOUR WEEKS     Performed at [facility]   Report Status PENDING   Incomplete  CULTURE, RESPIRATORY (NON-EXPECTORATED)     Status: None   Collection Time    02/19/13  5:38 AM      Result Value Ref Range Status   Specimen Description TRACHEAL ASPIRATE   Final   Special Requests NONE   Final   Gram Stain     Final   Value: NO WBC SEEN     NO SQUAMOUS EPITHELIAL CELLS SEEN     NO ORGANISMS SEEN     Performed at [facility]   Culture     Final   Value: Non-Pathogenic Oropharyngeal-type Flora Isolated.     Performed at [facility]   Report Status 02/21/2013 FINAL   Final  CULTURE, RESPIRATORY (NON-EXPECTORATED)     Status: None   Collection Time    02/19/13  9:53 AM      Result Value Ref Range Status   Specimen Description TRACHEAL ASPIRATE   Final   Special Requests NONE   Final   Gram Stain     Final   Value: FEW WBC PRESENT, PREDOMINANTLY PMN     RARE SQUAMOUS EPITHELIAL CELLS PRESENT     RARE GRAM POSITIVE COCCI     IN PAIRS     Performed at [facility]   Culture     Final   Value: Non-Pathogenic Oropharyngeal-type Flora Isolated.     Performed at [facility]   Report Status 02/21/2013 FINAL   Final  CULTURE, BLOOD (ROUTINE X 2)     Status: None   Collection Time    02/24/13  3:00 PM      Result Value Ref Range Status   Specimen Description BLOOD LEFT ARM   Final   Special Requests BOTTLES DRAWN AEROBIC ONLY 6CC   Final   Culture  Setup Time     Final   Value: 02/24/2013 22:10     Performed at [facility]   Culture     Final   Value:        BLOOD CULTURE RECEIVED NO GROWTH TO DATE CULTURE WILL BE HELD FOR 5 DAYS BEFORE ISSUING A FINAL NEGATIVE REPORT     Performed at [facility]   Report Status PENDING   Incomplete   CULTURE, BLOOD (ROUTINE X 2)     Status: None   Collection Time    02/24/13  3:10 PM      Result Value Ref Range Status   Specimen Description BLOOD LEFT HAND   Final   Special Requests BOTTLES DRAWN AEROBIC ONLY [facility]   Final   Culture  Setup Time     Final   Value: 02/24/2013 22:11     Performed at [facility]   Culture     Final   Value:        BLOOD CULTURE RECEIVED NO GROWTH TO DATE CULTURE WILL BE HELD FOR 5 DAYS BEFORE ISSUING A FINAL NEGATIVE REPORT     Performed at [facility]   Report Status PENDING   Incomplete    Assessment: 8 Days Post-Op from pelvic exploration/washout, improving and making great progress.  E.coli grown from pelvic fluid cultures, blood and urine, resistant to Cipro.  Elevated WBC likely from both PNA and pelvic infection.   Plan: Plan to leave the foley catheter in for at least 2 weeks from time of exploration.  As long as he is improving, we will hold off on any imaging.    Penrose backed out ~2 inches today.  Will plan to do this again in two days.  The remaining care per IM, ID and Gen Surg., I really appreciate their help/input!  Ardis Hughs 02/26/2013, 1:49 PM

## 2013-02-26 NOTE — Evaluation (Signed)
Physical Therapy Evaluation Patient Details Name: Jason [name] MRN: [id number] DOB: 05-30-1945 Today's Date: 02/26/2013 Time: 2119-4174 PT Time Calculation (min): 22 min  PT Assessment / Plan / Recommendation History of Present Illness   68 yo AAF s/p TURP procedure, presented to [facility] ED after pulling out his foley catheter.  He was seen by urology for hematuria, found to have a lobulated mass in bladder and and underwent cystoscopy, TURBT and was discharged home with Foley on 2/13 from  [facility].  This admission his initial lab work revealed ARF, UTI, suspected aspiration pneumonia, and sepsis. He was febrile and hypoxic, with labs and symptoms of sepsis. CT scan ab/pelvis discussed w/ evidence of soft tissue infection in the pelvis with extension to the fascia of the rectus muscles and of the left internal obturator muscle consistent with necrotizing fasciitis. Underwent EXPLORATORY LAPAROTOMY drainage of retroperitoneal abscess, drainage of Pre-Peritoneal abscess and Exploration of bladder perforation Pt intubated 2/18- 2/20. Pt with history of CVA, dementia and ETOH abuse.  Clinical Impression  Pt admitted with above. Pt currently with functional limitations due to the deficits listed below (see PT Problem List).  Pt will benefit from skilled PT to increase their independence and safety with mobility to allow discharge to the venue listed below.       PT Assessment  Patient needs continued PT services    Follow Up Recommendations  SNF    Does the patient have the potential to tolerate intense rehabilitation      Barriers to Discharge        Equipment Recommendations  None recommended by PT    Recommendations for Other Services     Frequency Min 2X/week    Precautions / Restrictions Precautions Precautions: Fall Restrictions Weight Bearing Restrictions: No   Pertinent Vitals/Pain See flow sheet.      Mobility  Bed Mobility Overal bed mobility: Needs  Assistance Bed Mobility: Sit to Supine Sit to supine: Min assist General bed mobility comments: assist to bring legs up into bed. Transfers Overall transfer level: Needs assistance Equipment used: None Transfers: Stand Pivot Transfers;Sit to/from Stand Sit to Stand: Min assist Stand pivot transfers: Min assist General transfer comment: Assist for balance Ambulation/Gait Ambulation/Gait assistance: Min assist Ambulation Distance (Feet): 125 Feet Assistive device:  (Used IV pole) Gait Pattern/deviations: Step-through pattern;Decreased stride length;Narrow base of support Gait velocity interpretation: Below normal speed for age/gender General Gait Details: Initially tried rolling walker but pt with difficulty using and was more of a hinderance than help. Slightly unsteady requiring assist for balance. Verbal cues for safety.    Exercises     PT Diagnosis: Difficulty walking;Generalized weakness  PT Problem List: Decreased strength;Decreased activity tolerance;Decreased balance;Decreased mobility;Decreased safety awareness;Decreased knowledge of precautions PT Treatment Interventions: Gait training;Functional mobility training;Therapeutic activities;Therapeutic exercise;Balance training;Patient/family education     PT Goals(Current goals can be found in the care plan section) Acute Rehab PT Goals Patient Stated Goal: Not stated PT Goal Formulation: With patient Time For Goal Achievement: 03/05/13 Potential to Achieve Goals: Good  Visit Information  Last PT Received On: 02/26/13 Assistance Needed: +1 History of Present Illness:  68 yo AAF s/p TURP procedure, presented to [place] ED after pulling out his foley catheter.  He was seen by urology for hematuria, found to have a lobulated mass in bladder and and underwent cystoscopy, TURBT and was discharged home with Foley on 2/13 from  [facility].  This admission his initial lab work revealed ARF, UTI, suspected  aspiration  pneumonia, and sepsis. He was febrile and hypoxic, with labs and symptoms of sepsis. CT scan ab/pelvis discussed w/ evidence of soft tissue infection in the pelvis with extension to the fascia of the rectus muscles and of the left internal obturator muscle consistent with necrotizing fasciitis. Underwent EXPLORATORY LAPAROTOMY drainage of retroperitoneal abscess, drainage of Pre-Peritoneal abscess and Exploration of bladder perforation Pt intubated 2/18- 2/20. Pt with history of CVA, dementia and ETOH abuse.       Prior [place] expects to be discharged to:: Private residence Living Arrangements: Other relatives (sister) Available Help at Discharge: Family Home Access: Level entry Home Layout: One level Home Equipment: None Additional Comments: Information per pt Prior Function Level of Independence: Independent Comments: Amb without assistive device Communication Communication: No difficulties    Cognition  Cognition Arousal/Alertness: Awake/alert Behavior During Therapy: Restless Overall Cognitive Status: No family/caregiver present to determine baseline cognitive functioning Area of Impairment: Safety/judgement;Memory;Problem solving;Orientation Orientation Level: Disoriented to;Place;Time;Situation Memory: Decreased recall of precautions;Decreased short-term memory Safety/Judgement: Decreased awareness of safety;Decreased awareness of deficits Problem Solving: Requires verbal cues    Extremity/Trunk Assessment Upper Extremity Assessment Upper Extremity Assessment: Overall WFL for tasks assessed Lower Extremity Assessment Lower Extremity Assessment: Generalized weakness   Balance Balance Overall balance assessment: Needs assistance Sitting-balance support: No upper extremity supported;Feet supported Sitting balance-Leahy Scale: Good Standing balance support: No upper extremity supported Standing balance-Leahy Scale: Fair  End of Session PT -  End of Session Activity Tolerance: Patient tolerated treatment well Patient left: in bed;with call bell/phone within reach;with bed alarm set Nurse Communication: Mobility status; left O2 off of pt.  GP     Jason [name] 02/26/2013, 12:12 PM  [place]

## 2013-02-26 NOTE — Clinical Social Work Placement (Signed)
Clinical Social Work Department CLINICAL SOCIAL WORK PLACEMENT NOTE 02/26/2013  Patient:  Jason [name], Jason [name]  Account Number:  [id number] [place] date:  02/18/2013  Clinical Social Worker:  Kemper Durie, [place]  Date/time:  02/23/2013 01:30 PM  Clinical Social Work is seeking post-discharge placement for this patient at the following level of care:   [place]   (*CSW will update this form in Epic as items are completed)   02/23/2013  Patient/family provided with [place] Department of Clinical Social Work's list of facilities offering this level of care within the geographic area requested by the patient (or if unable, by the patient's family).  02/23/2013  Patient/family informed of their freedom to choose among providers that offer the needed level of care, that participate in Medicare, Medicaid or managed care program needed by the patient, have an available bed and are willing to accept the patient.  02/23/2013  Patient/family informed of [facility]' ownership interest in [facility], as well as of the fact that they are under no obligation to receive care at this facility.  PASARR submitted to [facility] on  PASARR number received from [facility] on   FL2 transmitted to all facilities in geographic area requested by pt/family on  02/24/2013 FL2 transmitted to all facilities within larger geographic area on   Patient informed that his/her managed care company has contracts with or will negotiate with  certain facilities, including the following:     Patient/family informed of bed offers received:  02/25/2013 Patient chooses bed at [facility] Physician recommends and patient chooses bed at    Patient to be transferred to [facility] on   Patient to be transferred to facility by   The following physician request were entered in Epic:   Additional Comments: Daughter chose bed at [facility] which has a  contract with [facility].   Liz Beach, [place], [place], [phone]

## 2013-02-26 NOTE — Progress Notes (Signed)
Peripherally Inserted Central Catheter/Midline Placement  The IV Nurse has discussed with the patient and/or persons authorized to consent for the patient, the purpose of this procedure and the potential benefits and risks involved with this procedure.  The benefits include less needle sticks, lab draws from the catheter and patient may be discharged home with the catheter.  Risks include, but not limited to, infection, bleeding, blood clot (thrombus formation), and puncture of an artery; nerve damage and irregular heat beat.  Alternatives to this procedure were also discussed.  PICC/Midline Placement Documentation        Jason [name] 02/26/2013, 9:02 AM

## 2013-02-26 NOTE — Progress Notes (Signed)
8 Days Post-Op  Subjective: Patient currently having PICC line placed for several weeks of IV antibiotics WBC continues to improve No new issues  Objective: Vital signs in last 24 hours: Temp:  [98.2 F (36.8 C)-99.4 F (37.4 C)] 99.4 F (37.4 C) (02/26 0354) Pulse Rate:  [76-88] 83 (02/26 0354) Resp:  [18] 18 (02/26 0354) BP: (108-119)/(61-68) 108/68 mmHg (02/26 0354) SpO2:  [91 %-94 %] 92 % (02/26 0354) Weight:  [157 lb 6.5 oz (71.4 kg)] 157 lb 6.5 oz (71.4 kg) (02/26 0354) Last BM Date: 02/25/13  Intake/Output from previous day: 02/25 0701 - 02/26 0700 In: 1492 [I.V.:1492] Out: 1425 [Urine:1425] Intake/Output this shift:      Lab Results:   Recent Labs  02/25/13 0520 02/26/13 0438  WBC 18.9* 16.5*  HGB 10.4* 10.7*  HCT 30.9* 31.7*  PLT 285 308   BMET  Recent Labs  02/25/13 0520 02/26/13 0438  NA 139 140  K 3.5* 3.4*  CL 101 102  CO2 28 30  GLUCOSE 115* 126*  BUN 16 12  CREATININE 0.71 0.76  CALCIUM 7.4* 7.4*   PT/INR No results found for this basename: LABPROT, INR,  in the last 72 hours ABG No results found for this basename: PHART, PCO2, PO2, HCO3,  in the last 72 hours  Studies/Results: No results found.  Anti-infectives: Anti-infectives   Start     Dose/Rate Route Frequency Ordered Stop   02/23/13 1200  Ampicillin-Sulbactam (UNASYN) 3 g in sodium chloride 0.9 % 100 mL IVPB     3 g 100 mL/hr over 60 Minutes Intravenous Every 6 hours 02/23/13 1109     02/22/13 1600  piperacillin-tazobactam (ZOSYN) IVPB 3.375 g  Status:  Discontinued     3.375 g 12.5 mL/hr over 240 Minutes Intravenous Every 8 hours 02/22/13 1516 02/23/13 1109   02/22/13 1200  vancomycin (VANCOCIN) IVPB 1000 mg/200 mL premix  Status:  Discontinued     1,000 mg 200 mL/hr over 60 Minutes Intravenous Every 12 hours 02/22/13 1111 02/23/13 1109   02/19/13 2300  vancomycin (VANCOCIN) IVPB 750 mg/150 ml premix  Status:  Discontinued     750 mg 150 mL/hr over 60 Minutes  Intravenous Every 12 hours 02/19/13 1436 02/20/13 0943   02/19/13 1500  meropenem (MERREM) 1 g in sodium chloride 0.9 % 100 mL IVPB  Status:  Discontinued     1 g 200 mL/hr over 30 Minutes Intravenous 3 times per day 02/19/13 1436 02/22/13 1455   02/19/13 1000  vancomycin (VANCOCIN) IVPB 750 mg/150 ml premix  Status:  Discontinued     750 mg 150 mL/hr over 60 Minutes Intravenous Every 24 hours 02/18/13 1031 02/19/13 1437   02/18/13 1800  clindamycin (CLEOCIN) IVPB 900 mg  Status:  Discontinued     900 mg 100 mL/hr over 30 Minutes Intravenous 4 times per day 02/18/13 1626 02/20/13 0943   02/18/13 1800  meropenem (MERREM) 500 mg in sodium chloride 0.9 % 50 mL IVPB  Status:  Discontinued     500 mg 100 mL/hr over 30 Minutes Intravenous Every 12 hours 02/18/13 1649 02/19/13 1436   02/18/13 1200  piperacillin-tazobactam (ZOSYN) IVPB 2.25 g  Status:  Discontinued     2.25 g 100 mL/hr over 30 Minutes Intravenous 4 times per day 02/18/13 1026 02/18/13 1626   02/18/13 1100  vancomycin (VANCOCIN) 1,500 mg in sodium chloride 0.9 % 500 mL IVPB     1,500 mg 250 mL/hr over 120 Minutes Intravenous  Once 02/18/13 1026  02/18/13 1300   02/18/13 0800  cefTRIAXone (ROCEPHIN) 1 g in dextrose 5 % 50 mL IVPB     1 g Intravenous  Once 02/18/13 0752 02/18/13 0848      Assessment/Plan: s/p Procedure(s): EXPLORATORY LAPAROTOMY drainage of retroperitoneal abscess, drainage of Pre-Peritoneal abscess and Exploration of bladder perforation (N/A) Continue dressing changes  LOS: 8 days    Jason [name] K. 02/26/2013

## 2013-02-26 NOTE — Progress Notes (Signed)
Patient Demographics  Jason [name], is a 68 y.o. male, DOB - [birth year], FY:[id number]  Admit date - 02/18/2013   Admitting Physician Juanito Doom, MD  Outpatient Primary MD for the patient is [name], MD  LOS - 8   Chief Complaint  Patient presents with  . Pulled Foley out        BRIEF PATIENT DESCRIPTION: 68 yo AAF s/p TURP procedure, presented to [facility] ED after pulling out his foley catheter.  He was seen by urology for hematuria, found to have a lobulated mass in bladder and and underwent cystoscopy, TURBT and was discharged home with Foley on 2/13 from  [facility].  This admission his initial lab work revealed ARF, UTI, suspected aspiration pneumonia, and sepsis. He was febrile and hypoxic, with labs and symptoms of sepsis. CT scan ab/pelvis discussed w/ evidence of soft tissue infection in the pelvis with extension to the fascia of the rectus muscles and of the left internal obturator muscle consistent with necrotizing fasciitis. No visible pus collections in the pelvis as well as extensive bilateral lower lobe pneumonia. Blood pressures became soft, but pt responded well to 3L IVF.  He was admitted to pulmonary critical care, required intubation and mechanical ventilation from 02/18/2013 to 02/20/2013, he went to the OR and was operated upon by urology and general surgery, on 02/21/2013 he was transferred to the floor under the care of hospitalist by pulmonary critical care.   [facility] has assumed his care on 02/22/2013   Assessment & Plan    Severe Sepsis with possible necrotizing fasciitis of the rectus muscle and left internal obturator  currently hemodynamically stable - sepsis resolved.  Status post exploratory laparotomy and drainage by general surgery and urology on 02/18/2013,    status post intubation and extubation was extubated on 02/20/2013.    JP drain was removed on 2/24 by general surgery  Penrose drains are being managed by Urology.  Per Dr. Louis Meckel these drains will be very slowly backed out in order to prevent another abscess from forming.  This will most likely take until Monday.  Patient receiving Wet to Dry dressing changes.  Leave foley catheter in place for at least 2 weeks post op per Urology.  E-coli Bacteremia  1 of 2 blood cultures drawn 2/18 positive for e-coli.  Antibiotics are being managed by infectious disease and have been narrowed to Unasyn as of February 23.   Per ID he will need several more weeks of IV antibiotic therapy.  Repeat blood cultures ordered 2/24.  These show NGTD.    PICC placed on 2/26.   HCAP / Aspiration Pneumonia   Much improved.  Was initially treated with Vancomycin.    This has since been narrowed to Unasyn by Infectious Disease.     Bronch evidence of HCAP with report suggesting possible aspiration.     Speech evaluation completed.  Regular diet with thin liquids recommended.    Pulmonary toiletry, chest PT, Oxygen,  nebulizer treatments as needed.  Hiccups  Started 2/24  Will start low dose oral thorazine qid.  Acute renal failure.   Due to severe sepsis.  Resolved with IV fluids,   Hypokalemia.    Resolved after supplementation.  Will continue to monitor intermittently.  Dementia.   Will remain at risk for delirium.   Supportive care, avoid benzos.  History of alcohol abuse.   Counseled to quit alcohol.   Folate, thiamine and multivitamins. No signs of DTs.  History of  Hematuria and Urothelial carcinoma  follows with urology at [facility], post discharge he will follow with them.   Sacral decub  10 cm x 10 cm in size.  Recommendations left by [place] on 2/19.  Will as WOC to revisit.  Air Flow Mattress ordered for weight distribution.     Code Status:  Full  Family Communication: daughter and friend at bedside.  Disposition Plan: SNF vs SNF when appropriate.  Likely Monday 3/2.  Received a call from [facility] [phone]) who let me know that [facility] can provide PT and Wound care to the patient after discharge.  We will ask PT to evaluate closer to discharge and then discuss with [facility] to determine the best discharge disposition.   Procedures    2/18 US Renal: No hydronephrosis.  2/18 Exploratory laparotomy with drainage of retroperitoneal and preperitoneal soft tissue infection  2/19 Intubated. 2/19 Bronchoscopy,Sent purulent fluid  2/20 extubated  2/25 PICC ordered    LINES / TUBES:  2/18 Foley Cath>>  2/18 PIV x1>>  2/18 RIJ>>>2/20  2/19 OETT>>2/20   Consults Urology,CCS, PCCM, ID cord.    Medications  Scheduled Meds: . albuterol  2.5 mg Nebulization BID  . ampicillin-sulbactam (UNASYN) IV  3 g Intravenous Q6H  . antiseptic oral rinse  15 mL Mouth Rinse QID  . chlorhexidine  15 mL Mouth Rinse BID  . chlorproMAZINE  25 mg Oral QID  . feeding supplement (GLUCERNA SHAKE)  237 mL Oral BID BM  . folic acid  1 mg Oral Daily  . heparin subcutaneous  5,000 Units Subcutaneous 3 times per day  . hydrocortisone sod succinate (SOLU-CORTEF) inj  25 mg Intravenous Q12H  . insulin aspart  0-9 Units Subcutaneous 6 times per day  . iron polysaccharides  150 mg Oral Daily  . multivitamin with minerals  1 tablet Oral Daily  . pantoprazole  40 mg Oral Q1200  . sodium chloride  1,000 mL Intravenous Once  . sodium chloride  3 mL Intravenous Q12H  . thiamine  100 mg Oral Daily   Continuous Infusions: . sodium chloride Stopped (02/20/13 1600)  . dextrose 60 mL (02/26/13 1026)  . fentaNYL infusion INTRAVENOUS Stopped (02/20/13 0746)  . norepinephrine (LEVOPHED) Adult infusion    . phenylephrine (NEO-SYNEPHRINE) Adult infusion     PRN Meds:.acetaminophen, acetaminophen, fentaNYL, fentaNYL, food thickener,  metoprolol, ondansetron (ZOFRAN) IV, ondansetron, sodium chloride  DVT Prophylaxis    Heparin   Lab Results  Component Value Date   PLT 308 02/26/2013    Antibiotics     Anti-infectives   Start     Dose/Rate Route Frequency Ordered Stop   02/23/13 1200  Ampicillin-Sulbactam (UNASYN) 3 g in sodium chloride 0.9 % 100 mL IVPB     3 g 100 mL/hr over 60 Minutes Intravenous Every 6 hours 02/23/13 1109     02/22/13 1600  piperacillin-tazobactam (ZOSYN) IVPB 3.375 g  Status:  Discontinued     3.375 g 12.5 mL/hr over 240 Minutes Intravenous Every 8 hours 02/22/13 1516 02/23/13 1109   02/22/13 1200  vancomycin (VANCOCIN) IVPB 1000 mg/200 mL premix  Status:  Discontinued     1,000 mg 200 mL/hr over 60 Minutes Intravenous Every 12 hours  02/22/13 1111 02/23/13 1109   02/19/13 2300  vancomycin (VANCOCIN) IVPB 750 mg/150 ml premix  Status:  Discontinued     750 mg 150 mL/hr over 60 Minutes Intravenous Every 12 hours 02/19/13 1436 02/20/13 0943   02/19/13 1500  meropenem (MERREM) 1 g in sodium chloride 0.9 % 100 mL IVPB  Status:  Discontinued     1 g 200 mL/hr over 30 Minutes Intravenous 3 times per day 02/19/13 1436 02/22/13 1455   02/19/13 1000  vancomycin (VANCOCIN) IVPB 750 mg/150 ml premix  Status:  Discontinued     750 mg 150 mL/hr over 60 Minutes Intravenous Every 24 hours 02/18/13 1031 02/19/13 1437   02/18/13 1800  clindamycin (CLEOCIN) IVPB 900 mg  Status:  Discontinued     900 mg 100 mL/hr over 30 Minutes Intravenous 4 times per day 02/18/13 1626 02/20/13 0943   02/18/13 1800  meropenem (MERREM) 500 mg in sodium chloride 0.9 % 50 mL IVPB  Status:  Discontinued     500 mg 100 mL/hr over 30 Minutes Intravenous Every 12 hours 02/18/13 1649 02/19/13 1436   02/18/13 1200  piperacillin-tazobactam (ZOSYN) IVPB 2.25 g  Status:  Discontinued     2.25 g 100 mL/hr over 30 Minutes Intravenous 4 times per day 02/18/13 1026 02/18/13 1626   02/18/13 1100  vancomycin (VANCOCIN) 1,500 mg in sodium  chloride 0.9 % 500 mL IVPB     1,500 mg 250 mL/hr over 120 Minutes Intravenous  Once 02/18/13 1026 02/18/13 1300   02/18/13 0800  cefTRIAXone (ROCEPHIN) 1 g in dextrose 5 % 50 mL IVPB     1 g Intravenous  Once 02/18/13 0752 02/18/13 0848          Subjective:   Jason [name] today has no complaints.  Appears comfortable.  Requests ice water for hiccups.  Objective:   Filed Vitals:   02/25/13 1432 02/25/13 2023 02/26/13 0354 02/26/13 1139  BP: 113/64 119/61 108/68   Pulse: 76 88 83   Temp: 98.2 F (36.8 C) 99 F (37.2 C) 99.4 F (37.4 C)   TempSrc: Oral Oral Oral   Resp: 18 18 18    Height:      Weight:   71.4 kg (157 lb 6.5 oz)   SpO2: 94% 91% 92% 97%    Wt Readings from Last 3 Encounters:  02/26/13 71.4 kg (157 lb 6.5 oz)  02/26/13 71.4 kg (157 lb 6.5 oz)  02/10/13 67.586 kg (149 lb)     Intake/Output Summary (Last 24 hours) at 02/26/13 1318 Last data filed at 02/26/13 1031  Gross per 24 hour  Intake   1505 ml  Output   1825 ml  Net   -320 ml     Physical Exam  Awake Alert, Oriented X 3, No new F.N deficits, Normal affect, pleasant. Patient with hiccups. [place].AT,PERRAL Supple Neck,No JVD, No cervical lymphadenopathy appriciated.  Symmetrical Chest wall movement, Good air movement bilaterally RRR,No Gallops,Rubs or new Murmurs, No Parasternal Heave +ve B.Sounds, Abd Soft, Non tender, No organomegaly appriciated, No rebound . Penrose drain & foley in place.  Large sacral wound bandaged clean and dry. No Cyanosis, Clubbing or edema, No new Rash or bruise      Data Review   Micro Results Recent Results (from the past 240 hour(s))  URINE CULTURE     Status: None   Collection Time    02/18/13  7:20 AM      Result Value Ref Range Status   Specimen Description  URINE, CLEAN CATCH   Final   Special Requests NONE   Final   Culture  Setup Time     Final   Value: 02/18/2013 10:26     Performed at [place]     Final   Value: >=100,000  COLONIES/ML     Performed at [facility]   Culture     Final   Value: ESCHERICHIA COLI     Performed at [facility]   Report Status 02/22/2013 FINAL   Final   Organism ID, Bacteria ESCHERICHIA COLI   Final  CULTURE, BLOOD (ROUTINE X 2)     Status: None   Collection Time    02/18/13  8:15 AM      Result Value Ref Range Status   Specimen Description BLOOD SITE NOT SPECIFIED DRAWN BY RN   Final   Special Requests BOTTLES DRAWN AEROBIC AND ANAEROBIC 4CC   Final   Culture NO GROWTH 5 DAYS   Final   Report Status 02/23/2013 FINAL   Final  CULTURE, BLOOD (ROUTINE X 2)     Status: None   Collection Time    02/18/13  8:30 AM      Result Value Ref Range Status   Specimen Description BLOOD RIGHT ARM   Final   Special Requests BOTTLES DRAWN AEROBIC ONLY [facility]   Final   Culture  Setup Time     Final   Value: 02/20/2013 01:21     Performed at [facility]   Culture     Final   Value: ESCHERICHIA COLI     1822 Note: Gram Stain Report Called to,Read Back By and Verified With: Girard Cooter 02/19/2013 B[place]AM Performed at [facility]     Performed at [facility]   Report Status 02/22/2013 FINAL   Final   Organism ID, Bacteria ESCHERICHIA COLI   Final  MRSA PCR SCREENING     Status: None   Collection Time    02/18/13 10:13 AM      Result Value Ref Range Status   MRSA by PCR NEGATIVE  NEGATIVE Final   Comment:            The GeneXpert MRSA Assay (FDA     approved for NASAL specimens     only), is one component of a     comprehensive MRSA colonization     surveillance program. It is not     intended to diagnose MRSA     infection nor to guide or     monitor treatment for     MRSA infections.  AFB CULTURE WITH SMEAR     Status: None   Collection Time    02/19/13 12:28 AM      Result Value Ref Range Status   Specimen Description ABSCESS ABDOMEN   Final   Special Requests PRE PERITONEAL   Final   ACID FAST SMEAR     Final   Value: NO ACID FAST  BACILLI SEEN     Performed at [facility]   Culture     Final   Value: CULTURE WILL BE EXAMINED FOR 6 WEEKS BEFORE ISSUING A FINAL REPORT     Performed at [facility]   Report Status PENDING   Incomplete  ANAEROBIC CULTURE     Status: None   Collection Time    02/19/13 12:30 AM      Result Value Ref Range Status   Specimen  Description ABSCESS ABDOMEN   Final   Special Requests PRE PERITONEAL   Final   Gram Stain     Final   Value: ABUNDANT WBC PRESENT,BOTH PMN AND MONONUCLEAR     NO SQUAMOUS EPITHELIAL CELLS SEEN     MODERATE GRAM NEGATIVE RODS     Performed at [facility]   Culture     Final   Value: NO ANAEROBES ISOLATED     Performed at [facility]   Report Status 02/24/2013 FINAL   Final  CULTURE, ROUTINE-ABSCESS     Status: None   Collection Time    02/19/13 12:31 AM      Result Value Ref Range Status   Specimen Description ABSCESS ABDOMEN   Final   Special Requests PRE PERITONEAL   Final   Gram Stain     Final   Value: ABUNDANT WBC PRESENT,BOTH PMN AND MONONUCLEAR     NO SQUAMOUS EPITHELIAL CELLS SEEN     MODERATE GRAM NEGATIVE RODS     Performed at [facility]   Culture     Final   Value: MODERATE ESCHERICHIA COLI     Performed at [facility]   Report Status 02/21/2013 FINAL   Final   Organism ID, Bacteria ESCHERICHIA COLI   Final  FUNGUS CULTURE W SMEAR     Status: None   Collection Time    02/19/13 12:33 AM      Result Value Ref Range Status   Specimen Description ABSCESS ABDOMEN   Final   Special Requests PRE PERITONEAL   Final   Fungal Smear     Final   Value: NO YEAST OR FUNGAL ELEMENTS SEEN     Performed at [facility]   Culture     Final   Value: CULTURE IN PROGRESS FOR FOUR WEEKS     Performed at [facility]   Report Status PENDING   Incomplete  CULTURE, RESPIRATORY (NON-EXPECTORATED)     Status: None   Collection Time    02/19/13  5:38 AM      Result Value Ref Range Status    Specimen Description TRACHEAL ASPIRATE   Final   Special Requests NONE   Final   Gram Stain     Final   Value: NO WBC SEEN     NO SQUAMOUS EPITHELIAL CELLS SEEN     NO ORGANISMS SEEN     Performed at [facility]   Culture     Final   Value: Non-Pathogenic Oropharyngeal-type Flora Isolated.     Performed at [facility]   Report Status 02/21/2013 FINAL   Final  CULTURE, RESPIRATORY (NON-EXPECTORATED)     Status: None   Collection Time    02/19/13  9:53 AM      Result Value Ref Range Status   Specimen Description TRACHEAL ASPIRATE   Final   Special Requests NONE   Final   Gram Stain     Final   Value: FEW WBC PRESENT, PREDOMINANTLY PMN     RARE SQUAMOUS EPITHELIAL CELLS PRESENT     RARE GRAM POSITIVE COCCI     IN PAIRS     Performed at [facility]   Culture     Final   Value: Non-Pathogenic Oropharyngeal-type Flora Isolated.     Performed at [facility]   Report Status 02/21/2013 FINAL   Final  CULTURE, BLOOD (ROUTINE X 2)     Status: None  Collection Time    02/24/13  3:00 PM      Result Value Ref Range Status   Specimen Description BLOOD LEFT ARM   Final   Special Requests BOTTLES DRAWN AEROBIC ONLY 6CC   Final   Culture  Setup Time     Final   Value: 02/24/2013 22:10     Performed at [facility]   Culture     Final   Value:        BLOOD CULTURE RECEIVED NO GROWTH TO DATE CULTURE WILL BE HELD FOR 5 DAYS BEFORE ISSUING A FINAL NEGATIVE REPORT     Performed at [facility]   Report Status PENDING   Incomplete  CULTURE, BLOOD (ROUTINE X 2)     Status: None   Collection Time    02/24/13  3:10 PM      Result Value Ref Range Status   Specimen Description BLOOD LEFT HAND   Final   Special Requests BOTTLES DRAWN AEROBIC ONLY [facility]   Final   Culture  Setup Time     Final   Value: 02/24/2013 22:11     Performed at [facility]   Culture     Final   Value:        BLOOD CULTURE RECEIVED NO GROWTH TO DATE CULTURE WILL BE  HELD FOR 5 DAYS BEFORE ISSUING A FINAL NEGATIVE REPORT     Performed at [facility]   Report Status PENDING   Incomplete    Radiology Reports Ct Abdomen Pelvis Wo Contrast  02/18/2013   CLINICAL DATA:  Fever. Recent transurethral resection of bladder tumor. Extraluminal gas collection in the left side of the pelvis.  EXAM: CT ABDOMEN AND PELVIS WITHOUT CONTRAST  TECHNIQUE: Multidetector CT imaging of the abdomen and pelvis was performed following the standard protocol without intravenous contrast.  COMPARISON:  Radiograph dated 02/18/2013 and CT scan dated 08/30/2006  FINDINGS: There are bilateral extensive lower lobe consolidative pulmonary infiltrates. No effusions. Heart size is normal. Tiny hiatal hernia.  There is extensive extraluminal air in the soft tissues of the pelvis including the presacral space and along the left side of the pelvis along the fascia of the left antral operator muscle as well as extending anteriorly along the deep fascia of the rectus muscles. This is consistent with and anaerobic infection and possible necrotizing fasciitis.  The liver, biliary tree, spleen, pancreas, adrenal glands, and kidneys demonstrate no significant abnormality. The bowel appears normal. Foley catheter is in place in the bladder with a small amount of air in the bladder.  IMPRESSION: 1. Evidence consistent with anaerobic soft tissue infection in the pelvis with extension to the fascia of the rectus muscles and of the left internal obturator muscle consistent with necrotizing fasciitis. No visible pus collections in the pelvis. 2. Extensive bilateral lower lobe pneumonia.  Critical Value/emergent results were called by telephone at the time of interpretation on 02/18/2013 at 3:55 PM to Dr. Murray Hodgkins , who verbally acknowledged these results.   Electronically Signed   By: Rozetta Nunnery M.D.   On: 02/18/2013 15:56     US Renal  02/18/2013   CLINICAL DATA:  Acute renal failure  EXAM:  RENAL/URINARY TRACT ULTRASOUND COMPLETE  COMPARISON:  01/13/2013  FINDINGS: Right Kidney:  Length: 11.2 cm.  No mass or hydronephrosis.  Left Kidney:  Length: 11.0 cm.  No mass or hydronephrosis.  Bladder:  Poorly visualized/decompressed by indwelling Foley catheter.  IMPRESSION: No hydronephrosis.  Bladder  poorly visualized/decompressed by indwelling Foley catheter.   Electronically Signed   By: Julian Hy M.D.   On: 02/18/2013 13:36      Dg Abd 2 Views  02/18/2013   CLINICAL DATA:  Bladder tumor, urothelial carcinoma, possible vomiting, post TURBT 02/13/2013  EXAM: ABDOMEN - 2 VIEW  COMPARISON:  None  FINDINGS: Bibasilar infiltrates question pneumonia versus aspiration.  Air-filled nondistended small bowel loops the mid abdomen, question postoperative ileus.  Bubbly gas identified in the pelvis, could be related to stool within the distal colon but extending farther left lateral than is typically seen with rectal vault stool, raising question of extraperitoneal gas in the left pelvis.  Bones unremarkable.  No urinary tract calcification.  No definite free intraperitoneal air.  IMPRESSION: Bibasilar opacities question pneumonia versus aspiration.  Potential postoperative ileus.  Bubbly gas extending farther left laterally in the inferior pelvis than is typically seen with stool, question prominent stool versus extraperitoneal extraluminal gas in patient who has had a recent cystoscopy and TURBT ; CT imaging of the abdomen and pelvis with IV and oral contrast recommended.  Findings called to Dr. Sarajane Jews on 02/18/2013 at 1315 hr.   Electronically Signed   By: Lavonia Dana M.D.   On: 02/18/2013 13:16    CBC  Recent Labs Lab 02/22/13 0805 02/23/13 0640 02/24/13 0542 02/25/13 0520 02/26/13 0438  WBC 27.6* 27.6* 21.9* 18.9* 16.5*  HGB 12.4* 11.7* 12.3* 10.4* 10.7*  HCT 36.2* 33.5* 36.3* 30.9* 31.7*  PLT 266 253 289 285 308  MCV 69.9* 68.9* 70.2* 69.8* 69.5*  MCH 23.9* 24.1* 23.8* 23.5* 23.5*   MCHC 34.3 34.9 33.9 33.7 33.8  RDW 13.3 13.0 13.4 13.0 13.3    Chemistries   Recent Labs Lab 02/20/13 0025  02/22/13 0455 02/23/13 0640 02/24/13 0542 02/25/13 0520 02/26/13 0438  NA  --   < > 147 143 141 139 140  K  --   < > 3.4* 3.9 3.3* 3.5* 3.4*  CL  --   < > 109 105 101 101 102  CO2  --   < > 27 29 32 28 30  GLUCOSE  --   < > 104* 127* 92 115* 126*  BUN  --   < > 29* 22 16 16 12   CREATININE  --   < > 0.89 0.84 0.89 0.71 0.76  CALCIUM  --   < > 8.1* 7.7* 7.8* 7.4* 7.4*  MG 2.5  --   --   --   --   --   --   < > = values in this interval not displayed.  Coagulation profile No results found for this basename: INR, PROTIME,  in the last 168 hours   Time Spent in minutes   35   York, Bobby Rumpf PA-C on 02/26/2013 at 1:18 PM  Between 7am to 7pm - Pager - [phone]  After 7pm go to [email or web address] - password [email or web address]  And look for the night coverage person covering for me after hours  [facility] Office  [phone]  Attending Seen and examined the, agree with the above assessment and plan. Continue with antibiotics, continue with postop issues at the discretion of general surgery and urology.  Nena Alexander MD

## 2013-02-27 LAB — GLUCOSE, CAPILLARY
GLUCOSE-CAPILLARY: 77 mg/dL (ref 70–99)
GLUCOSE-CAPILLARY: 129 mg/dL — AB (ref 70–99)
GLUCOSE-CAPILLARY: 105 mg/dL — AB (ref 70–99)
Glucose-Capillary: 120 mg/dL — ABNORMAL HIGH (ref 70–99)
Glucose-Capillary: 128 mg/dL — ABNORMAL HIGH (ref 70–99)
Glucose-Capillary: 129 mg/dL — ABNORMAL HIGH (ref 70–99)

## 2013-02-27 MED ORDER — HYDROCORTISONE NA SUCCINATE PF 100 MG IJ SOLR
25.0000 mg | Freq: Every day | INTRAMUSCULAR | Status: DC
  Administered 2013-02-28: 25 mg via INTRAVENOUS
  Filled 2013-02-27: qty 0.5

## 2013-02-27 MED ORDER — METRONIDAZOLE 500 MG PO TABS
500.0000 mg | ORAL_TABLET | Freq: Three times a day (TID) | ORAL | Status: DC
  Administered 2013-02-27 – 2013-03-02 (×9): 500 mg via ORAL
  Filled 2013-02-27 (×11): qty 1

## 2013-02-27 MED ORDER — POTASSIUM CHLORIDE CRYS ER 20 MEQ PO TBCR
40.0000 meq | EXTENDED_RELEASE_TABLET | Freq: Once | ORAL | Status: AC
Start: 2013-02-27 — End: 2013-02-27
  Administered 2013-02-27: 40 meq via ORAL
  Filled 2013-02-27: qty 2

## 2013-02-27 MED ORDER — GLUCERNA SHAKE PO LIQD
237.0000 mL | ORAL | Status: DC
  Administered 2013-02-28 – 2013-03-02 (×3): 237 mL via ORAL

## 2013-02-27 MED ORDER — DEXTROSE 5 % IV SOLN
1.0000 g | INTRAVENOUS | Status: DC
  Administered 2013-02-27 – 2013-03-01 (×3): 1 g via INTRAVENOUS
  Filled 2013-02-27 (×4): qty 10

## 2013-02-27 MED ORDER — CHLORPROMAZINE HCL 25 MG PO TABS
25.0000 mg | ORAL_TABLET | Freq: Four times a day (QID) | ORAL | Status: DC | PRN
  Filled 2013-02-27: qty 1

## 2013-02-27 MED ORDER — COLLAGENASE 250 UNIT/GM EX OINT
TOPICAL_OINTMENT | Freq: Every day | CUTANEOUS | Status: DC
Start: 2013-02-27 — End: 2013-03-02
  Administered 2013-02-27: 12:00:00 via TOPICAL
  Administered 2013-02-28: 1 via TOPICAL
  Administered 2013-03-02: 11:00:00 via TOPICAL
  Filled 2013-02-27: qty 30

## 2013-02-27 NOTE — Progress Notes (Signed)
Patient ID: Jason [name], male   DOB: 1945-04-12, 68 y.o.   MRN: [id number]         [facility]    Date of Admission:  02/18/2013   Total days of antibiotics 10         Principal Problem:   Sepsis Active Problems:   ALCOHOL ABUSE   Memory loss   UTI (lower urinary tract infection)   Aspiration pneumonia   Acute respiratory failure with hypoxia   Acute renal failure   Dementia   Hematuria   Urothelial carcinoma   . albuterol  2.5 mg Nebulization BID  . antiseptic oral rinse  15 mL Mouth Rinse QID  . cefTRIAXone (ROCEPHIN)  IV  1 g Intravenous Q24H  . chlorhexidine  15 mL Mouth Rinse BID  . collagenase   Topical Daily  . [START ON 02/28/2013] feeding supplement (GLUCERNA SHAKE)  237 mL Oral Q24H  . folic acid  1 mg Oral Daily  . heparin subcutaneous  5,000 Units Subcutaneous 3 times per day  . [START ON 02/28/2013] hydrocortisone sod succinate (SOLU-CORTEF) inj  25 mg Intravenous Daily  . insulin aspart  0-9 Units Subcutaneous 6 times per day  . iron polysaccharides  150 mg Oral Daily  . metroNIDAZOLE  500 mg Oral 3 times per day  . multivitamin with minerals  1 tablet Oral Daily  . pantoprazole  40 mg Oral Q1200  . sodium chloride  1,000 mL Intravenous Once  . sodium chloride  3 mL Intravenous Q12H  . 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  Plan: 1. Change ampicillin sulbactam to ceftriaxone and metronidazole 2. We will followup early next week  3. Please call Dr. Carlyle Basques [phone]) for infectious disease  questions this weekend   [name], MD [facility] [place] [phone] pager   [phone] cell 02/27/2013, 3:13 PM

## 2013-02-27 NOTE — Progress Notes (Signed)
NUTRITION FOLLOW-UP  INTERVENTION: Decrease Glucerna Shake to po daily, each supplement provides 220 kcal and 10 grams ofD protein. Continue MVI daily. RD to continue to follow nutrition care plan.  NUTRITION DIAGNOSIS: Inadequate oral intake now related to variable appetite AEB limited oral intake. Improving.  Goal: Intake to meet >90% of estimated nutrition needs.  Monitor:  weight trend, labs, PO intake, supplement tolerance  ASSESSMENT: Patient is a 68 yo AAF s/p TURP procedure, presented to [facility] ED after pulling out his foley catheter. Initial lab work revealed ARF, UTI, suspected aspiration pneumonia, and sepsis. CT scan abd/pelvis consistent w/ anaerobic soft tissue infection in the pelvis with extension to the fascia of the rectus muscles and of the left internal obturator muscle consistent with necrotizing fasciitis.  Exploration of bladder perforation and I&D with insertion of drain for necrotizing fascitis on 2/19. Per notes, wound continues to heal well.  Bronchoscopy 2/20. Extubated 2/20. BSE completed by SLP on 2/21 with recommendations for Dysphagia 3 diet with Nectar-Thickened liquids, including full supervision with all meals. MBSS 2/23 recommending Regular diet with thin liquids. Pt is eating well, consuming 100% of his meals. Confirmed with nurse tech that pt is eating well. Pt states that he doesn't really like the Glucerna Shake, but has consumed at least 1/2 of a can this morning. Discussed why it is important and will decrease to daily.  Patient is likely to d/c to SNF on Monday, 3/2, per MD note.  Potassium is low at 3.4 - currently ordered for KCl.  Height: Ht Readings from Last 1 Encounters:  02/21/13 6' (1.829 m)    Weight: Wt Readings from Last 1 Encounters:  02/27/13 154 lb 1.6 oz (69.9 kg)  Admit wt 165 lb   BMI:  Body mass index is 20.9 kg/(m^2). Normal weight  Estimated Nutritional Needs: Kcal: 1850 - 2000 Protein: 100-115 gm Fluid: 2  L  Skin: abdominal incision; sDTI of sacral wound  Diet Order: General   Intake/Output Summary (Last 24 hours) at 02/27/13 1011 Last data filed at 02/27/13 1006  Gross per 24 hour  Intake 2434.33 ml  Output   2500 ml  Net -65.67 ml    Last BM: 2/26  Labs:   Recent Labs Lab 02/24/13 0542 02/25/13 0520 02/26/13 0438  NA 141 139 140  K 3.3* 3.5* 3.4*  CL 101 101 102  CO2 32 28 30  BUN 16 16 12   CREATININE 0.89 0.71 0.76  CALCIUM 7.8* 7.4* 7.4*  GLUCOSE 92 115* 126*    CBG (last 3)   Recent Labs  02/26/13 2356 02/27/13 0420 02/27/13 0741  GLUCAP 210* 129* 77    Scheduled Meds: . albuterol  2.5 mg Nebulization BID  . ampicillin-sulbactam (UNASYN) IV  3 g Intravenous Q6H  . antiseptic oral rinse  15 mL Mouth Rinse QID  . chlorhexidine  15 mL Mouth Rinse BID  . chlorproMAZINE  25 mg Oral QID  . feeding supplement (GLUCERNA SHAKE)  237 mL Oral BID BM  . folic acid  1 mg Oral Daily  . heparin subcutaneous  5,000 Units Subcutaneous 3 times per day  . hydrocortisone sod succinate (SOLU-CORTEF) inj  25 mg Intravenous Q12H  . insulin aspart  0-9 Units Subcutaneous 6 times per day  . iron polysaccharides  150 mg Oral Daily  . multivitamin with minerals  1 tablet Oral Daily  . pantoprazole  40 mg Oral Q1200  . potassium chloride  40 mEq Oral Once  . sodium  chloride  1,000 mL Intravenous Once  . sodium chloride  3 mL Intravenous Q12H  . thiamine  100 mg Oral Daily    Continuous Infusions: . sodium chloride 10 mL/hr (02/26/13 1303)  . dextrose 60 mL (02/26/13 1026)  . fentaNYL infusion INTRAVENOUS Stopped (02/20/13 0746)  . norepinephrine (LEVOPHED) Adult infusion    . phenylephrine (NEO-SYNEPHRINE) Adult infusion      Inda Coke MS, RD, LDN Inpatient Registered Dietitian Pager: [phone] After-hours pager: [phone]

## 2013-02-27 NOTE — Progress Notes (Signed)
Urology Inpatient Progress Report  ID: s/p TURBT for ~4cm bladder tumor on left lateral wall on 3/55/73, complicated by bladder perforation.  Subsequently presented to OSH after removing his catheter accidentally.  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  He is s/p ex. Lap/ pelvis washout/debridement, 02/19/13.      Intv/Subj: Penrose backed out yesterday No issues overnight Afebrile No complaints    Past Medical History  Diagnosis Date  . Hypertension   . Stroke     Dementia, otherwise, no residual  . Bladder mass 02-10-13    surgery planned for this  . 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 COPD (chronic obstructive pulmonary disease)     previous history and current smoking  . 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 Dementia 02/18/2013   Current Facility-Administered Medications  Medication Dose Route Frequency Provider Last Rate Last Dose  . 0.9 %  sodium chloride infusion   Intravenous Continuous Leone Haven, MD 10 mL/hr at 02/26/13 1303 10 mL/hr at 02/26/13 1303  . acetaminophen (TYLENOL) tablet 650 mg  650 mg Oral Q6H PRN Samuella Cota, MD   650 mg at 02/18/13 1717   Or  . acetaminophen (TYLENOL) suppository 650 mg  650 mg Rectal Q6H PRN Samuella Cota, MD      . albuterol (PROVENTIL) (2.5 MG/3ML) 0.083% nebulizer solution 2.5 mg  2.5 mg Nebulization BID Renee A Kuneff, DO   2.5 mg at 02/26/13 2202  . Ampicillin-Sulbactam (UNASYN) 3 g in sodium chloride 0.9 % 100 mL IVPB  3 g Intravenous Q6H [name], MD   3 g at 02/27/13 0537  . antiseptic oral rinse (BIOTENE) solution 15 mL  15 mL Mouth Rinse QID Renee A Kuneff, DO   15 mL at 02/27/13 0400  . chlorhexidine (PERIDEX) 0.12 % solution 15 mL  15 mL Mouth Rinse BID Renee A Kuneff, DO   15 mL at 02/26/13 2032  . chlorproMAZINE (THORAZINE) tablet 25 mg  25 mg Oral QID Melton Alar, PA-C   25 mg at 02/27/13 5427  . dextrose 5 % solution   Intravenous  Continuous Renee A Kuneff, DO 60 mL/hr at 02/26/13 1026 60 mL at 02/26/13 1026  . feeding supplement (GLUCERNA SHAKE) (GLUCERNA SHAKE) liquid 237 mL  237 mL Oral BID BM Erlene Quan, RD   237 mL at 02/26/13 1315  . fentaNYL (SUBLIMAZE) 10 mcg/mL in sodium chloride 0.9 % 250 mL infusion  0-400 mcg/hr Intravenous Continuous Renee A Kuneff, DO   50 mcg/hr at 02/19/13 1800  . fentaNYL (SUBLIMAZE) bolus via infusion 25-50 mcg  25-50 mcg Intravenous Q1H PRN Renee A Kuneff, DO      . fentaNYL (SUBLIMAZE) injection 50 mcg  50 mcg Intravenous Q2H PRN Renee A Kuneff, DO   50 mcg at 02/21/13 1700  . folic acid (FOLVITE) tablet 1 mg  1 mg Oral Daily Thurnell Lose, MD   1 mg at 02/26/13 1027  . food thickener (THICK IT) powder   Oral PRN Thurnell Lose, MD      . heparin injection 5,000 Units  5,000 Units Subcutaneous 3 times per day Imogene Burn. Tsuei, MD   5,000 Units at 02/27/13 0533  . hydrocortisone sodium succinate (SOLU-CORTEF) 100 MG injection 25 mg  25 mg Intravenous Q12H Cherene Altes, MD   25 mg at 02/27/13 0534  . insulin aspart (novoLOG) injection 0-9 Units  0-9 Units Subcutaneous 6  times per day Raylene Miyamoto, MD   1 Units at 02/27/13 [phone]  . iron polysaccharides (NIFEREX) capsule 150 mg  150 mg Oral Daily Melton Alar, PA-C   150 mg at 02/26/13 1027  . metoprolol (LOPRESSOR) injection 2.5-5 mg  2.5-5 mg Intravenous Q3H PRN Colbert Coyer, MD      . multivitamin with minerals tablet 1 tablet  1 tablet Oral Daily Erlene Quan, RD   1 tablet at 02/26/13 1026  . norepinephrine (LEVOPHED) 4 mg in dextrose 5 % 250 mL infusion  2-50 mcg/min Intravenous Titrated Renee A Kuneff, DO      . ondansetron (ZOFRAN) tablet 4 mg  4 mg Oral Q6H PRN Samuella Cota, MD       Or  . ondansetron [facility]) injection 4 mg  4 mg Intravenous Q6H PRN Samuella Cota, MD      . pantoprazole (PROTONIX) EC tablet 40 mg  40 mg Oral Q1200 Thurnell Lose, MD   40 mg at 02/26/13 1215  .  phenylephrine (NEO-SYNEPHRINE) 10 mg in dextrose 5 % 250 mL infusion  30-200 mcg/min Intravenous Titrated Juanito Doom, MD      . sodium chloride 0.9 % bolus 1,000 mL  1,000 mL Intravenous Once Renee A Kuneff, DO      . sodium chloride 0.9 % injection 10-40 mL  10-40 mL Intracatheter PRN Jonetta Osgood, MD   10 mL at 02/27/13 0523  . sodium chloride 0.9 % injection 3 mL  3 mL Intravenous Q12H Samuella Cota, MD   3 mL at 02/26/13 1031  . thiamine (VITAMIN B-1) tablet 100 mg  100 mg Oral Daily Thurnell Lose, MD   100 mg at 02/26/13 1027    Objective: Vital: Filed Vitals:   02/26/13 1139 02/26/13 1336 02/26/13 2144 02/27/13 0427  BP:  127/64 125/60 115/55  Pulse:  86 91 66  Temp:  97.9 F (36.6 C) 98.8 F (37.1 C) 98.1 F (36.7 C)  TempSrc:  Oral Oral Oral  Resp:  18 17 16   Height:      Weight:    69.9 kg (154 lb 1.6 oz)  SpO2: 97% 100% 93% 96%    I/Os: I/O last 3 completed shifts: In: 2160.3 [I.V.:1560.3; IV Piggyback:600] Out: 2325 [Urine:2325]  Physical Exam:  General: Patient is in no apparent distress Lungs: coarse breath sounds GI: The abdomen is soft a, nontender.  Midline wound opened/packed and starting to granulate.  He has two penrose drains on either side of his pelvis, both with purulent drainage. Foley is draining clear yellow urine. Ext: lower extremities symmetric  Lab Results:  Recent Labs  02/25/13 0520 02/26/13 0438  WBC 18.9* 16.5*  HGB 10.4* 10.7*  HCT 30.9* 31.7*    Recent Labs  02/25/13 0520 02/26/13 0438  NA 139 140  K 3.5* 3.4*  CL 101 102  CO2 28 30  GLUCOSE 115* 126*  BUN 16 12  CREATININE 0.71 0.76  CALCIUM 7.4* 7.4*   No results found for this basename: LABPT, INR,  in the last 72 hours No results found for this basename: LABURIN,  in the last 72 hours Results for orders placed during the hospital encounter of 02/18/13  URINE CULTURE     Status: None   Collection Time    02/18/13  7:20 AM      Result Value Ref  Range Status   Specimen Description URINE, CLEAN CATCH   Final  Special Requests NONE   Final   Culture  Setup Time     Final   Value: 02/18/2013 10:26     Performed at [place]     Final   Value: >=100,000 COLONIES/ML     Performed at [facility]   Culture     Final   Value: ESCHERICHIA COLI     Performed at [facility]   Report Status 02/22/2013 FINAL   Final   Organism ID, Bacteria ESCHERICHIA COLI   Final  CULTURE, BLOOD (ROUTINE X 2)     Status: None   Collection Time    02/18/13  8:15 AM      Result Value Ref Range Status   Specimen Description BLOOD SITE NOT SPECIFIED DRAWN BY RN   Final   Special Requests BOTTLES DRAWN AEROBIC AND ANAEROBIC 4CC   Final   Culture NO GROWTH 5 DAYS   Final   Report Status 02/23/2013 FINAL   Final  CULTURE, BLOOD (ROUTINE X 2)     Status: None   Collection Time    02/18/13  8:30 AM      Result Value Ref Range Status   Specimen Description BLOOD RIGHT ARM   Final   Special Requests BOTTLES DRAWN AEROBIC ONLY [facility]   Final   Culture  Setup Time     Final   Value: 02/20/2013 01:21     Performed at [facility]   Culture     Final   Value: ESCHERICHIA COLI     1822 Note: Gram Stain Report Called to,Read Back By and Verified With: Girard Cooter 02/19/2013 B[place]AM Performed at [facility]     Performed at [facility]   Report Status 02/22/2013 FINAL   Final   Organism ID, Bacteria ESCHERICHIA COLI   Final  MRSA PCR SCREENING     Status: None   Collection Time    02/18/13 10:13 AM      Result Value Ref Range Status   MRSA by PCR NEGATIVE  NEGATIVE Final   Comment:            The GeneXpert MRSA Assay (FDA     approved for NASAL specimens     only), is one component of a     comprehensive MRSA colonization     surveillance program. It is not     intended to diagnose MRSA     infection nor to guide or     monitor treatment for     MRSA infections.  AFB CULTURE WITH SMEAR      Status: None   Collection Time    02/19/13 12:28 AM      Result Value Ref Range Status   Specimen Description ABSCESS ABDOMEN   Final   Special Requests PRE PERITONEAL   Final   ACID FAST SMEAR     Final   Value: NO ACID FAST BACILLI SEEN     Performed at [facility]   Culture     Final   Value: CULTURE WILL BE EXAMINED FOR 6 WEEKS BEFORE ISSUING A FINAL REPORT     Performed at [facility]   Report Status PENDING   Incomplete  ANAEROBIC CULTURE     Status: None   Collection Time    02/19/13 12:30 AM      Result Value Ref Range Status   Specimen Description ABSCESS ABDOMEN   Final   Special  Requests PRE PERITONEAL   Final   Gram Stain     Final   Value: ABUNDANT WBC PRESENT,BOTH PMN AND MONONUCLEAR     NO SQUAMOUS EPITHELIAL CELLS SEEN     MODERATE GRAM NEGATIVE RODS     Performed at [facility]   Culture     Final   Value: NO ANAEROBES ISOLATED     Performed at [facility]   Report Status 02/24/2013 FINAL   Final  CULTURE, ROUTINE-ABSCESS     Status: None   Collection Time    02/19/13 12:31 AM      Result Value Ref Range Status   Specimen Description ABSCESS ABDOMEN   Final   Special Requests PRE PERITONEAL   Final   Gram Stain     Final   Value: ABUNDANT WBC PRESENT,BOTH PMN AND MONONUCLEAR     NO SQUAMOUS EPITHELIAL CELLS SEEN     MODERATE GRAM NEGATIVE RODS     Performed at [facility]   Culture     Final   Value: MODERATE ESCHERICHIA COLI     Performed at [facility]   Report Status 02/21/2013 FINAL   Final   Organism ID, Bacteria ESCHERICHIA COLI   Final  FUNGUS CULTURE W SMEAR     Status: None   Collection Time    02/19/13 12:33 AM      Result Value Ref Range Status   Specimen Description ABSCESS ABDOMEN   Final   Special Requests PRE PERITONEAL   Final   Fungal Smear     Final   Value: NO YEAST OR FUNGAL ELEMENTS SEEN     Performed at [facility]   Culture     Final   Value: CULTURE IN  PROGRESS FOR FOUR WEEKS     Performed at [facility]   Report Status PENDING   Incomplete  CULTURE, RESPIRATORY (NON-EXPECTORATED)     Status: None   Collection Time    02/19/13  5:38 AM      Result Value Ref Range Status   Specimen Description TRACHEAL ASPIRATE   Final   Special Requests NONE   Final   Gram Stain     Final   Value: NO WBC SEEN     NO SQUAMOUS EPITHELIAL CELLS SEEN     NO ORGANISMS SEEN     Performed at [facility]   Culture     Final   Value: Non-Pathogenic Oropharyngeal-type Flora Isolated.     Performed at [facility]   Report Status 02/21/2013 FINAL   Final  CULTURE, RESPIRATORY (NON-EXPECTORATED)     Status: None   Collection Time    02/19/13  9:53 AM      Result Value Ref Range Status   Specimen Description TRACHEAL ASPIRATE   Final   Special Requests NONE   Final   Gram Stain     Final   Value: FEW WBC PRESENT, PREDOMINANTLY PMN     RARE SQUAMOUS EPITHELIAL CELLS PRESENT     RARE GRAM POSITIVE COCCI     IN PAIRS     Performed at [facility]   Culture     Final   Value: Non-Pathogenic Oropharyngeal-type Flora Isolated.     Performed at [facility]   Report Status 02/21/2013 FINAL   Final  CULTURE, BLOOD (ROUTINE X 2)     Status: None   Collection Time    02/24/13  3:00 PM  Result Value Ref Range Status   Specimen Description BLOOD LEFT ARM   Final   Special Requests BOTTLES DRAWN AEROBIC ONLY [facility]   Final   Culture  Setup Time     Final   Value: 02/24/2013 22:10     Performed at [facility]   Culture     Final   Value:        BLOOD CULTURE RECEIVED NO GROWTH TO DATE CULTURE WILL BE HELD FOR 5 DAYS BEFORE ISSUING A FINAL NEGATIVE REPORT     Performed at [facility]   Report Status PENDING   Incomplete  CULTURE, BLOOD (ROUTINE X 2)     Status: None   Collection Time    02/24/13  3:10 PM      Result Value Ref Range Status   Specimen Description BLOOD LEFT HAND   Final   Special  Requests BOTTLES DRAWN AEROBIC ONLY [place]   Final   Culture  Setup Time     Final   Value: 02/24/2013 22:11     Performed at [facility]   Culture     Final   Value:        BLOOD CULTURE RECEIVED NO GROWTH TO DATE CULTURE WILL BE HELD FOR 5 DAYS BEFORE ISSUING A FINAL NEGATIVE REPORT     Performed at [facility]   Report Status PENDING   Incomplete    Assessment: 9 Days Post-Op from pelvic exploration/washout, improving and making great progress.  E.coli grown from pelvic fluid cultures, blood and urine, resistant to Cipro.  Elevated WBC likely from both PNA and pelvic infection.   Plan: Plan to leave the foley catheter in for at least 2 weeks from time of exploration.  As long as he is improving, we will hold off on any imaging.   -no additional urologic recommendations  The remaining care per IM, ID and Gen Surg., I really appreciate their help/input!  Ardis Hughs 02/27/2013, 6:44 AM

## 2013-02-27 NOTE — Progress Notes (Signed)
Physical Therapy Wound Treatment Patient Details  Name: Jason [name] MRN: [id number] Date of Birth: Apr 28, 1945  Today's Date: 02/27/2013 Time: 1210-1302 Time Calculation (min): 52 min  Subjective  Subjective: Pt stated yes when asked if he wanted to get up in chair for lunch. Patient and Family Stated Goals: Not stated Date of Onset:  (Deep tissue injury prior to adm)  Pain Score:    Wound Assessment  Pressure Ulcer Unstageable - Full thickness tissue loss in which the base of the ulcer is covered by slough (yellow, tan, gray, green or brown) and/or eschar (tan, brown or black) in the wound bed. (Active)  Dressing Type Foam;Moist to dry;Other (Comment) 02/27/2013  1:15 PM  Dressing Changed 02/27/2013  1:15 PM  Dressing Change Frequency Daily 02/27/2013  1:15 PM  State of Healing Eschar 02/27/2013  1:15 PM  Site / Wound Assessment Clean;Dry 02/27/2013  1:15 PM  % Wound base Red or Granulating 0% 02/27/2013  1:15 PM  % Wound base Yellow 0% 02/27/2013  1:15 PM  % Wound base Black 100% 02/27/2013  1:15 PM  % Wound base Other (Comment) 0% 02/27/2013  1:15 PM  Peri-wound Assessment Intact 02/27/2013  1:15 PM  Wound Length (cm) 10 cm 02/27/2013  1:15 PM  Wound Width (cm) 11 cm 02/27/2013  1:15 PM  Wound Depth (cm) 0.2 cm 02/27/2013  1:15 PM  Drainage Amount Minimal 02/27/2013  1:15 PM  Treatment Debridement (Selective);Hydrotherapy (Pulse lavage) 02/27/2013  1:15 PM   Hydrotherapy Pulsed lavage therapy - wound location: sacrum Pulsed Lavage with Suction (psi): 12 psi Pulsed Lavage with Suction - Normal Saline Used: 1000 mL Pulsed Lavage Tip: Tip with splash shield Selective Debridement Selective Debridement - Location: Used scalpel to score eschar Selective Debridement - Tools Used: Scalpel Selective Debridement - Tissue Removed: None removed due to adherent   Wound Assessment and Plan  Wound Therapy - Assess/Plan/Recommendations Wound Therapy - Clinical Statement: Pt presents to hydrotherapy  with unstageable pressure ulcer on sacrum. Needs hydrotherapy to decrease eschar and promote wound healing. Wound Therapy - Functional Problem List: decr sitting tolerance. Factors Delaying/Impairing Wound Healing: Multiple medical problems;Polypharmacy;Tobacco use Hydrotherapy Plan: Debridement;Dressing change;Patient/family education;Pulsatile lavage with suction Wound Therapy - Frequency: 6X / week Wound Therapy - Follow Up Recommendations: Skilled nursing facility Wound Plan: See plan  Wound Therapy Goals- Improve the function of patient's integumentary system by progressing the wound(s) through the phases of wound healing (inflammation - proliferation - remodeling) by: Decrease Necrotic Tissue to: 80% Decrease Necrotic Tissue - Progress: Goal set today Increase Granulation Tissue to: 20% Increase Granulation Tissue - Progress: Goal set today Goals/treatment plan/discharge plan were made with and agreed upon by patient/family: No, Patient unable to participate in goals/treatment/discharge plan and family unavailable Time For Goal Achievement: 6 days Wound Therapy - Potential for Goals: Fair  Goals will be updated until maximal potential achieved or discharge criteria met.  Discharge criteria: when goals achieved, discharge from hospital, MD decision/surgical intervention, no progress towards goals, refusal/missing three consecutive treatments without notification or medical reason.  GP     Jason [name] 02/27/2013, 1:23 PM  [name]

## 2013-02-27 NOTE — Progress Notes (Signed)
9 Days Post-Op  Subjective: Resting comfortably  Objective: Vital signs in last 24 hours: Temp:  [97.9 F (36.6 C)-98.8 F (37.1 C)] 98.1 F (36.7 C) (02/27 0427) Pulse Rate:  [66-91] 66 (02/27 0427) Resp:  [16-18] 16 (02/27 0427) BP: (115-127)/(55-64) 115/55 mmHg (02/27 0427) SpO2:  [93 %-100 %] 96 % (02/27 0427) Weight:  [154 lb 1.6 oz (69.9 kg)] 154 lb 1.6 oz (69.9 kg) (02/27 0427) Last BM Date: 02/26/13  Intake/Output from previous day: 02/26 0701 - 02/27 0700 In: 2074.3 [I.V.:1474.3; IV Piggyback:600] Out: 2000 [Urine:2000] Intake/Output this shift:    Midline incision clean, granulating; intact; minimal drainage  Lab Results:   Recent Labs  02/25/13 0520 02/26/13 0438  WBC 18.9* 16.5*  HGB 10.4* 10.7*  HCT 30.9* 31.7*  PLT 285 308   BMET  Recent Labs  02/25/13 0520 02/26/13 0438  NA 139 140  K 3.5* 3.4*  CL 101 102  CO2 28 30  GLUCOSE 115* 126*  BUN 16 12  CREATININE 0.71 0.76  CALCIUM 7.4* 7.4*   PT/INR No results found for this basename: LABPROT, INR,  in the last 72 hours ABG No results found for this basename: PHART, PCO2, PO2, HCO3,  in the last 72 hours  Studies/Results: No results found.  Anti-infectives: Anti-infectives   Start     Dose/Rate Route Frequency Ordered Stop   02/23/13 1200  Ampicillin-Sulbactam (UNASYN) 3 g in sodium chloride 0.9 % 100 mL IVPB     3 g 100 mL/hr over 60 Minutes Intravenous Every 6 hours 02/23/13 1109     02/22/13 1600  piperacillin-tazobactam (ZOSYN) IVPB 3.375 g  Status:  Discontinued     3.375 g 12.5 mL/hr over 240 Minutes Intravenous Every 8 hours 02/22/13 1516 02/23/13 1109   02/22/13 1200  vancomycin (VANCOCIN) IVPB 1000 mg/200 mL premix  Status:  Discontinued     1,000 mg 200 mL/hr over 60 Minutes Intravenous Every 12 hours 02/22/13 1111 02/23/13 1109   02/19/13 2300  vancomycin (VANCOCIN) IVPB 750 mg/150 ml premix  Status:  Discontinued     750 mg 150 mL/hr over 60 Minutes Intravenous Every 12  hours 02/19/13 1436 02/20/13 0943   02/19/13 1500  meropenem (MERREM) 1 g in sodium chloride 0.9 % 100 mL IVPB  Status:  Discontinued     1 g 200 mL/hr over 30 Minutes Intravenous 3 times per day 02/19/13 1436 02/22/13 1455   02/19/13 1000  vancomycin (VANCOCIN) IVPB 750 mg/150 ml premix  Status:  Discontinued     750 mg 150 mL/hr over 60 Minutes Intravenous Every 24 hours 02/18/13 1031 02/19/13 1437   02/18/13 1800  clindamycin (CLEOCIN) IVPB 900 mg  Status:  Discontinued     900 mg 100 mL/hr over 30 Minutes Intravenous 4 times per day 02/18/13 1626 02/20/13 0943   02/18/13 1800  meropenem (MERREM) 500 mg in sodium chloride 0.9 % 50 mL IVPB  Status:  Discontinued     500 mg 100 mL/hr over 30 Minutes Intravenous Every 12 hours 02/18/13 1649 02/19/13 1436   02/18/13 1200  piperacillin-tazobactam (ZOSYN) IVPB 2.25 g  Status:  Discontinued     2.25 g 100 mL/hr over 30 Minutes Intravenous 4 times per day 02/18/13 1026 02/18/13 1626   02/18/13 1100  vancomycin (VANCOCIN) 1,500 mg in sodium chloride 0.9 % 500 mL IVPB     1,500 mg 250 mL/hr over 120 Minutes Intravenous  Once 02/18/13 1026 02/18/13 1300   02/18/13 0800  cefTRIAXone (ROCEPHIN)  1 g in dextrose 5 % 50 mL IVPB     1 g Intravenous  Once 02/18/13 0752 02/18/13 0848      Assessment/Plan: s/p Procedure(s): EXPLORATORY LAPAROTOMY drainage of retroperitoneal abscess, drainage of Pre-Peritoneal abscess and Exploration of bladder perforation (N/A) Wound is healing well.  Continue dressing changes. Penrose drains per Urology We will check wound a couple of times a week.  Call us if any questions.    LOS: 9 days    Michio Thier K. 02/27/2013

## 2013-02-27 NOTE — Consult Note (Signed)
WOC wound follow up Wound type: sacral ulcer present on admission Measurement: 10cm x 11c x 0.2cm  Wound bed: 100 % soft eschar/yellow/black this was a DTI when pt admitted which commonly evolves into an unstageable pressure ulcer Drainage (amount, consistency, odor) minimal Periwound:intact Dressing procedure/placement/frequency: Add enzymatic debridement ointment and PT for hydrotherapy to clear the wound bed. Air mattress arrived at the time of my assessment.    [place] team will follow along with you for weekly wound assessments with PT during hydrotherapy.  If transfer to SNF would suggest daily Santyl, cover with moist gauze, and foam. Change daily and follow up in a wound care center of the patients choice.  Kyriakos Babler [place] RN,CWOCN [phone]

## 2013-02-27 NOTE — Progress Notes (Signed)
Patient Demographics  Jason [name], is a 68 y.o. male, DOB - [birth year], FY:[id number]  Admit date - 02/18/2013   Admitting Physician Jason Doom, MD  Outpatient Primary MD for the patient is [name], MD  LOS - 9   Chief Complaint  Patient presents with  . Pulled Foley out        BRIEF PATIENT DESCRIPTION: 68 yo AAF s/p TURP procedure, presented to [facility] ED after pulling out his foley catheter.  He was seen by urology for hematuria, found to have a lobulated mass in bladder and and underwent cystoscopy, TURBT and was discharged home with Foley on 2/13 from  [facility].  This admission his initial lab work revealed ARF, UTI, suspected aspiration pneumonia, and sepsis. He was febrile and hypoxic, with labs and symptoms of sepsis. CT scan ab/pelvis discussed w/ evidence of soft tissue infection in the pelvis with extension to the fascia of the rectus muscles and of the left internal obturator muscle consistent with necrotizing fasciitis. No visible pus collections in the pelvis as well as extensive bilateral lower lobe pneumonia. Blood pressures became soft, but pt responded well to 3L IVF and IV solucortef.  He was admitted to pulmonary critical care, required intubation and mechanical ventilation from 02/18/2013 to 02/20/2013, he went to the OR and was operated upon by urology and general surgery, on 02/21/2013 he was transferred to the floor under the care of hospitalist by pulmonary critical care.   [facility] has assumed his care on 02/22/2013   Assessment & Plan    Severe Sepsis with possible necrotizing fasciitis of the rectus muscle and left internal obturator  currently hemodynamically stable - sepsis resolved.  Status post exploratory laparotomy and drainage by general surgery and urology on  02/18/2013,   status post intubation and extubation was extubated on 02/20/2013.    JP drain was removed on 2/24 by general surgery  Penrose drains are being managed by Urology.  Per Dr. Louis Meckel these drains will be very slowly backed out in order to prevent another abscess from forming.  This will most likely take until Monday.  Patient receiving Wet to Dry dressing changes.  Leave foley catheter in place for at least 2 weeks post op per Urology.  Taper solucortef to once daily on 2/27.  Anticipate a prednisone taper will start on 2/28.  E-coli Bacteremia  1 of 2 blood cultures drawn 2/18 positive for e-coli.  Antibiotics are being managed by infectious disease and have been narrowed to Unasyn as of February 23.   Per ID he will need several more weeks of IV antibiotic therapy.  Repeat blood cultures ordered 2/24.  These show NGTD.    PICC placed on 2/26.   HCAP / Aspiration Pneumonia   Resolved.  Was initially treated with Vancomycin.    This has since been narrowed to Unasyn by Infectious Disease.     Bronch evidence of HCAP with report suggesting possible aspiration.     Speech evaluation completed.  Regular diet with thin liquids recommended.    Pulmonary toiletry, chest PT, Oxygen,  nebulizer treatments as needed.  Hiccups  Started 2/24  Resolved 2/26 on Thorazine.  Changed Thorazine to QID PRN Hiccups.  Acute renal failure.  Due to severe sepsis.  Resolved with IV fluids  Hypokalemia.    Resolved after supplementation.    Will continue to monitor intermittently.  Dementia.   Will remain at risk for delirium.   Supportive care, avoid benzos.  History of alcohol abuse.   Counseled to quit alcohol.   Folate, thiamine and multivitamins. No signs of DTs.  History of  Hematuria and Urothelial carcinoma  follows with urology at [facility], post discharge he will follow with them.   Sacral decub  10 cm x 10 cm in size.  Recommendations left  by Hagerman on 2/19.  Air Flow Mattress ordered for weight distribution.  WOC added enzymatic debridement ointment and PT for hydrotherapy to clear the wound bed.  When transfer to SNF, [place] suggests daily Santyl, cover with moist gauze, and foam. Change daily and follow up in a wound care center.   Code Status: Full  Family Communication: daughter and friend at bedside.  Disposition Plan: SNF vs SNF when appropriate.  Likely Monday 3/2.  Received a call from [facility] [phone]) who let me know that [facility] can provide PT and Wound care to the patient after discharge.  We will ask PT to evaluate closer to discharge and then discuss with [facility] to determine the best discharge disposition.   Procedures    2/18 US Renal: No hydronephrosis.  2/18 Exploratory laparotomy with drainage of retroperitoneal and preperitoneal soft tissue infection  2/19 Intubated. 2/19 Bronchoscopy,Sent purulent fluid  2/20 extubated  2/26 PICC placed.    LINES / TUBES:  2/18 Foley Cath>>  2/26 PICC 2/18 RIJ>>>2/20  2/19 OETT>>2/20   Consults Urology,CCS, PCCM, ID cord.    Medications  Scheduled Meds: . albuterol  2.5 mg Nebulization BID  . ampicillin-sulbactam (UNASYN) IV  3 g Intravenous Q6H  . antiseptic oral rinse  15 mL Mouth Rinse QID  . chlorhexidine  15 mL Mouth Rinse BID  . collagenase   Topical Daily  . [START ON 02/28/2013] feeding supplement (GLUCERNA SHAKE)  237 mL Oral Q24H  . folic acid  1 mg Oral Daily  . heparin subcutaneous  5,000 Units Subcutaneous 3 times per day  . [START ON 02/28/2013] hydrocortisone sod succinate (SOLU-CORTEF) inj  25 mg Intravenous Daily  . insulin aspart  0-9 Units Subcutaneous 6 times per day  . iron polysaccharides  150 mg Oral Daily  . multivitamin with minerals  1 tablet Oral Daily  . pantoprazole  40 mg Oral Q1200  . sodium chloride  1,000 mL Intravenous Once  . sodium chloride  3 mL Intravenous Q12H  . thiamine  100 mg Oral Daily    Continuous Infusions: . sodium chloride 10 mL/hr (02/26/13 1303)  . dextrose 60 mL (02/26/13 1026)  . fentaNYL infusion INTRAVENOUS Stopped (02/20/13 0746)  . norepinephrine (LEVOPHED) Adult infusion    . phenylephrine (NEO-SYNEPHRINE) Adult infusion     PRN Meds:.acetaminophen, acetaminophen, chlorproMAZINE, fentaNYL, fentaNYL, food thickener, metoprolol, ondansetron (ZOFRAN) IV, ondansetron, sodium chloride  DVT Prophylaxis    Heparin   Lab Results  Component Value Date   PLT 308 02/26/2013    Antibiotics     Anti-infectives   Start     Dose/Rate Route Frequency Ordered Stop   02/23/13 1200  Ampicillin-Sulbactam (UNASYN) 3 g in sodium chloride 0.9 % 100 mL IVPB     3 g 100 mL/hr over 60 Minutes Intravenous Every 6 hours 02/23/13 1109     02/22/13 1600  piperacillin-tazobactam (ZOSYN) IVPB  3.375 g  Status:  Discontinued     3.375 g 12.5 mL/hr over 240 Minutes Intravenous Every 8 hours 02/22/13 1516 02/23/13 1109   02/22/13 1200  vancomycin (VANCOCIN) IVPB 1000 mg/200 mL premix  Status:  Discontinued     1,000 mg 200 mL/hr over 60 Minutes Intravenous Every 12 hours 02/22/13 1111 02/23/13 1109   02/19/13 2300  vancomycin (VANCOCIN) IVPB 750 mg/150 ml premix  Status:  Discontinued     750 mg 150 mL/hr over 60 Minutes Intravenous Every 12 hours 02/19/13 1436 02/20/13 0943   02/19/13 1500  meropenem (MERREM) 1 g in sodium chloride 0.9 % 100 mL IVPB  Status:  Discontinued     1 g 200 mL/hr over 30 Minutes Intravenous 3 times per day 02/19/13 1436 02/22/13 1455   02/19/13 1000  vancomycin (VANCOCIN) IVPB 750 mg/150 ml premix  Status:  Discontinued     750 mg 150 mL/hr over 60 Minutes Intravenous Every 24 hours 02/18/13 1031 02/19/13 1437   02/18/13 1800  clindamycin (CLEOCIN) IVPB 900 mg  Status:  Discontinued     900 mg 100 mL/hr over 30 Minutes Intravenous 4 times per day 02/18/13 1626 02/20/13 0943   02/18/13 1800  meropenem (MERREM) 500 mg in sodium chloride 0.9 % 50 mL IVPB   Status:  Discontinued     500 mg 100 mL/hr over 30 Minutes Intravenous Every 12 hours 02/18/13 1649 02/19/13 1436   02/18/13 1200  piperacillin-tazobactam (ZOSYN) IVPB 2.25 g  Status:  Discontinued     2.25 g 100 mL/hr over 30 Minutes Intravenous 4 times per day 02/18/13 1026 02/18/13 1626   02/18/13 1100  vancomycin (VANCOCIN) 1,500 mg in sodium chloride 0.9 % 500 mL IVPB     1,500 mg 250 mL/hr over 120 Minutes Intravenous  Once 02/18/13 1026 02/18/13 1300   02/18/13 0800  cefTRIAXone (ROCEPHIN) 1 g in dextrose 5 % 50 mL IVPB     1 g Intravenous  Once 02/18/13 0752 02/18/13 0848          Subjective:   Khyan Philipp Ovens today has no complaints.  Appears comfortable.    Objective:   Filed Vitals:   02/26/13 1336 02/26/13 2144 02/27/13 0427 02/27/13 0906  BP: 127/64 125/60 115/55   Pulse: 86 91 66   Temp: 97.9 F (36.6 C) 98.8 F (37.1 C) 98.1 F (36.7 C)   TempSrc: Oral Oral Oral   Resp: 18 17 16    Height:      Weight:   69.9 kg (154 lb 1.6 oz)   SpO2: 100% 93% 96% 92%    Wt Readings from Last 3 Encounters:  02/27/13 69.9 kg (154 lb 1.6 oz)  02/27/13 69.9 kg (154 lb 1.6 oz)  02/10/13 67.586 kg (149 lb)     Intake/Output Summary (Last 24 hours) at 02/27/13 1209 Last data filed at 02/27/13 1006  Gross per 24 hour  Intake 2421.33 ml  Output   2100 ml  Net 321.33 ml     Physical Exam  Awake Alert, Oriented X 3, No new F.N deficits, Normal affect, pleasant. Eating breakfast. [place].AT,PERRAL Supple Neck,No JVD, No cervical lymphadenopathy appriciated.  Symmetrical Chest wall movement, Good air movement bilaterally RRR,No Gallops,Rubs or new Murmurs, No Parasternal Heave +ve B.Sounds, Abd Soft, Non tender, No organomegaly appriciated, No rebound . Penrose drain & foley in place.  Large sacral wound bandaged clean and dry. No Cyanosis, Clubbing or edema, No new Rash or bruise  Data Review   Micro Results Recent Results (from the past 240 hour(s))  URINE  CULTURE     Status: None   Collection Time    02/18/13  7:20 AM      Result Value Ref Range Status   Specimen Description URINE, CLEAN CATCH   Final   Special Requests NONE   Final   Culture  Setup Time     Final   Value: 02/18/2013 10:26     Performed at [facility]     Final   Value: >=100,000 COLONIES/ML     Performed at [facility]   Culture     Final   Value: ESCHERICHIA COLI     Performed at [facility]   Report Status 02/22/2013 FINAL   Final   Organism ID, Bacteria ESCHERICHIA COLI   Final  CULTURE, BLOOD (ROUTINE X 2)     Status: None   Collection Time    02/18/13  8:15 AM      Result Value Ref Range Status   Specimen Description BLOOD SITE NOT SPECIFIED DRAWN BY RN   Final   Special Requests BOTTLES DRAWN AEROBIC AND ANAEROBIC 4CC   Final   Culture NO GROWTH 5 DAYS   Final   Report Status 02/23/2013 FINAL   Final  CULTURE, BLOOD (ROUTINE X 2)     Status: None   Collection Time    02/18/13  8:30 AM      Result Value Ref Range Status   Specimen Description BLOOD RIGHT ARM   Final   Special Requests BOTTLES DRAWN AEROBIC ONLY [facility]   Final   Culture  Setup Time     Final   Value: 02/20/2013 01:21     Performed at [facility]   Culture     Final   Value: ESCHERICHIA COLI     1822 Note: Gram Stain Report Called to,Read Back By and Verified With: Girard Cooter 02/19/2013 B[place]GHAM Performed at [facility]     Performed at [facility]   Report Status 02/22/2013 FINAL   Final   Organism ID, Bacteria ESCHERICHIA COLI   Final  MRSA PCR SCREENING     Status: None   Collection Time    02/18/13 10:13 AM      Result Value Ref Range Status   MRSA by PCR NEGATIVE  NEGATIVE Final   Comment:            The GeneXpert MRSA Assay (FDA     approved for NASAL specimens     only), is one component of a     comprehensive MRSA colonization     surveillance program. It is not     intended to diagnose MRSA     infection  nor to guide or     monitor treatment for     MRSA infections.  AFB CULTURE WITH SMEAR     Status: None   Collection Time    02/19/13 12:28 AM      Result Value Ref Range Status   Specimen Description ABSCESS ABDOMEN   Final   Special Requests PRE PERITONEAL   Final   ACID FAST SMEAR     Final   Value: NO ACID FAST BACILLI SEEN     Performed at [facility]   Culture     Final   Value: CULTURE WILL BE EXAMINED FOR 6 WEEKS BEFORE ISSUING A FINAL REPORT  Performed at [facility]   Report Status PENDING   Incomplete  ANAEROBIC CULTURE     Status: None   Collection Time    02/19/13 12:30 AM      Result Value Ref Range Status   Specimen Description ABSCESS ABDOMEN   Final   Special Requests PRE PERITONEAL   Final   Gram Stain     Final   Value: ABUNDANT WBC PRESENT,BOTH PMN AND MONONUCLEAR     NO SQUAMOUS EPITHELIAL CELLS SEEN     MODERATE GRAM NEGATIVE RODS     Performed at [facility]   Culture     Final   Value: NO ANAEROBES ISOLATED     Performed at [facility]   Report Status 02/24/2013 FINAL   Final  CULTURE, ROUTINE-ABSCESS     Status: None   Collection Time    02/19/13 12:31 AM      Result Value Ref Range Status   Specimen Description ABSCESS ABDOMEN   Final   Special Requests PRE PERITONEAL   Final   Gram Stain     Final   Value: ABUNDANT WBC PRESENT,BOTH PMN AND MONONUCLEAR     NO SQUAMOUS EPITHELIAL CELLS SEEN     MODERATE GRAM NEGATIVE RODS     Performed at [facility]   Culture     Final   Value: MODERATE ESCHERICHIA COLI     Performed at [facility]   Report Status 02/21/2013 FINAL   Final   Organism ID, Bacteria ESCHERICHIA COLI   Final  FUNGUS CULTURE W SMEAR     Status: None   Collection Time    02/19/13 12:33 AM      Result Value Ref Range Status   Specimen Description ABSCESS ABDOMEN   Final   Special Requests PRE PERITONEAL   Final   Fungal Smear     Final   Value: NO YEAST OR FUNGAL ELEMENTS  SEEN     Performed at [facility]   Culture     Final   Value: CULTURE IN PROGRESS FOR FOUR WEEKS     Performed at [facility]   Report Status PENDING   Incomplete  CULTURE, RESPIRATORY (NON-EXPECTORATED)     Status: None   Collection Time    02/19/13  5:38 AM      Result Value Ref Range Status   Specimen Description TRACHEAL ASPIRATE   Final   Special Requests NONE   Final   Gram Stain     Final   Value: NO WBC SEEN     NO SQUAMOUS EPITHELIAL CELLS SEEN     NO ORGANISMS SEEN     Performed at [facility]   Culture     Final   Value: Non-Pathogenic Oropharyngeal-type Flora Isolated.     Performed at [facility]   Report Status 02/21/2013 FINAL   Final  CULTURE, RESPIRATORY (NON-EXPECTORATED)     Status: None   Collection Time    02/19/13  9:53 AM      Result Value Ref Range Status   Specimen Description TRACHEAL ASPIRATE   Final   Special Requests NONE   Final   Gram Stain     Final   Value: FEW WBC PRESENT, PREDOMINANTLY PMN     RARE SQUAMOUS EPITHELIAL CELLS PRESENT     RARE GRAM POSITIVE COCCI     IN PAIRS     Performed at [facility]  Culture     Final   Value: Non-Pathogenic Oropharyngeal-type Flora Isolated.     Performed at [facility]   Report Status 02/21/2013 FINAL   Final  CULTURE, BLOOD (ROUTINE X 2)     Status: None   Collection Time    02/24/13  3:00 PM      Result Value Ref Range Status   Specimen Description BLOOD LEFT ARM   Final   Special Requests BOTTLES DRAWN AEROBIC ONLY 6CC   Final   Culture  Setup Time     Final   Value: 02/24/2013 22:10     Performed at [facility]   Culture     Final   Value:        BLOOD CULTURE RECEIVED NO GROWTH TO DATE CULTURE WILL BE HELD FOR 5 DAYS BEFORE ISSUING A FINAL NEGATIVE REPORT     Performed at [facility]   Report Status PENDING   Incomplete  CULTURE, BLOOD (ROUTINE X 2)     Status: None   Collection Time    02/24/13  3:10 PM       Result Value Ref Range Status   Specimen Description BLOOD LEFT HAND   Final   Special Requests BOTTLES DRAWN AEROBIC ONLY [facility]   Final   Culture  Setup Time     Final   Value: 02/24/2013 22:11     Performed at [facility]   Culture     Final   Value:        BLOOD CULTURE RECEIVED NO GROWTH TO DATE CULTURE WILL BE HELD FOR 5 DAYS BEFORE ISSUING A FINAL NEGATIVE REPORT     Performed at [facility]   Report Status PENDING   Incomplete    Radiology Reports Ct Abdomen Pelvis Wo Contrast  02/18/2013   CLINICAL DATA:  Fever. Recent transurethral resection of bladder tumor. Extraluminal gas collection in the left side of the pelvis.  EXAM: CT ABDOMEN AND PELVIS WITHOUT CONTRAST  TECHNIQUE: Multidetector CT imaging of the abdomen and pelvis was performed following the standard protocol without intravenous contrast.  COMPARISON:  Radiograph dated 02/18/2013 and CT scan dated 08/30/2006  FINDINGS: There are bilateral extensive lower lobe consolidative pulmonary infiltrates. No effusions. Heart size is normal. Tiny hiatal hernia.  There is extensive extraluminal air in the soft tissues of the pelvis including the presacral space and along the left side of the pelvis along the fascia of the left antral operator muscle as well as extending anteriorly along the deep fascia of the rectus muscles. This is consistent with and anaerobic infection and possible necrotizing fasciitis.  The liver, biliary tree, spleen, pancreas, adrenal glands, and kidneys demonstrate no significant abnormality. The bowel appears normal. Foley catheter is in place in the bladder with a small amount of air in the bladder.  IMPRESSION: 1. Evidence consistent with anaerobic soft tissue infection in the pelvis with extension to the fascia of the rectus muscles and of the left internal obturator muscle consistent with necrotizing fasciitis. No visible pus collections in the pelvis. 2. Extensive bilateral lower lobe pneumonia.   Critical Value/emergent results were called by telephone at the time of interpretation on 02/18/2013 at 3:55 PM to Dr. Murray Hodgkins , who verbally acknowledged these results.   Electronically Signed   By: Rozetta Nunnery M.D.   On: 02/18/2013 15:56     US Renal  02/18/2013   CLINICAL DATA:  Acute renal failure  EXAM: RENAL/URINARY TRACT ULTRASOUND  COMPLETE  COMPARISON:  01/13/2013  FINDINGS: Right Kidney:  Length: 11.2 cm.  No mass or hydronephrosis.  Left Kidney:  Length: 11.0 cm.  No mass or hydronephrosis.  Bladder:  Poorly visualized/decompressed by indwelling Foley catheter.  IMPRESSION: No hydronephrosis.  Bladder poorly visualized/decompressed by indwelling Foley catheter.   Electronically Signed   By: Julian Hy M.D.   On: 02/18/2013 13:36      Dg Abd 2 Views  02/18/2013   CLINICAL DATA:  Bladder tumor, urothelial carcinoma, possible vomiting, post TURBT 02/13/2013  EXAM: ABDOMEN - 2 VIEW  COMPARISON:  None  FINDINGS: Bibasilar infiltrates question pneumonia versus aspiration.  Air-filled nondistended small bowel loops the mid abdomen, question postoperative ileus.  Bubbly gas identified in the pelvis, could be related to stool within the distal colon but extending farther left lateral than is typically seen with rectal vault stool, raising question of extraperitoneal gas in the left pelvis.  Bones unremarkable.  No urinary tract calcification.  No definite free intraperitoneal air.  IMPRESSION: Bibasilar opacities question pneumonia versus aspiration.  Potential postoperative ileus.  Bubbly gas extending farther left laterally in the inferior pelvis than is typically seen with stool, question prominent stool versus extraperitoneal extraluminal gas in patient who has had a recent cystoscopy and TURBT ; CT imaging of the abdomen and pelvis with IV and oral contrast recommended.  Findings called to Dr. Sarajane Jews on 02/18/2013 at 1315 hr.   Electronically Signed   By: Lavonia Dana M.D.   On:  02/18/2013 13:16    CBC  Recent Labs Lab 02/22/13 0805 02/23/13 0640 02/24/13 0542 02/25/13 0520 02/26/13 0438  WBC 27.6* 27.6* 21.9* 18.9* 16.5*  HGB 12.4* 11.7* 12.3* 10.4* 10.7*  HCT 36.2* 33.5* 36.3* 30.9* 31.7*  PLT 266 253 289 285 308  MCV 69.9* 68.9* 70.2* 69.8* 69.5*  MCH 23.9* 24.1* 23.8* 23.5* 23.5*  MCHC 34.3 34.9 33.9 33.7 33.8  RDW 13.3 13.0 13.4 13.0 13.3    Chemistries   Recent Labs Lab 02/22/13 0455 02/23/13 0640 02/24/13 0542 02/25/13 0520 02/26/13 0438  NA 147 143 141 139 140  K 3.4* 3.9 3.3* 3.5* 3.4*  CL 109 105 101 101 102  CO2 27 29 32 28 30  GLUCOSE 104* 127* 92 115* 126*  BUN 29* 22 16 16 12   CREATININE 0.89 0.84 0.89 0.71 0.76  CALCIUM 8.1* 7.7* 7.8* 7.4* 7.4*     Time Spent in minutes   35   York, Marianne L PA-C on 02/27/2013 at 12:09 PM  Between 7am to 7pm - Pager - [phone]  After 7pm go to [email or web address] - password [email or web address]  And look for the night coverage person covering for me after hours  [facility] Office  [phone]  Attending Patient seen and examined, agree with the above assessment and plan. Medical issues remain stable, will repeat CBC tomorrow. Continue with IV antibiotics, Penrose drains being slowly backed out by urology, current anticipation is for discharge to a skilled nursing facility in the next week.  Nena Alexander MD

## 2013-02-27 NOTE — Care Management Note (Signed)
    Page 1 of 1   03/02/2013     11:54:19 AM   CARE MANAGEMENT NOTE 03/02/2013  Patient:  Jason [name], Jason [name]   Account Number:  [id number]  Date Initiated:  02/24/2013  Documentation initiated by:  Tomi Bamberger  Subjective/Objective Assessment:   dx intra abd     Action/Plan:   Anticipated DC Date:  03/02/2013   Anticipated DC Plan:  SKILLED NURSING FACILITY  In-house referral  Clinical Social Worker      DC Planning Services  CM consult      Choice offered to / List presented to:             Status of service:  Completed, signed off Medicare Important Message given?   (If response is "NO", the following Medicare IM given date fields will be blank) Date Medicare IM given:   Date Additional Medicare IM given:    Discharge Disposition:  [place]  Per UR Regulation:  Reviewed for med. necessity/level of care/duration of stay  If discussed at [place] of Stay Meetings, dates discussed:   02/23/2013  02/26/2013    Comments:  03/02/13 [place], BSN [phone] patient is for dc [facility] today, CSW following.  02/25/13 [place], BSN [phone] patient s/p exp lap on 2/18, conts with pinrose drains, urology following, will back drains out slowly to prevent another abscess from forming per urology, plan is snf. NCM will continue to follow.

## 2013-02-28 LAB — CBC
HCT: 31.7 % — ABNORMAL LOW (ref 39.0–52.0)
Hemoglobin: 10.8 g/dL — ABNORMAL LOW (ref 13.0–17.0)
MCH: 23.8 pg — AB (ref 26.0–34.0)
MCHC: 34.1 g/dL (ref 30.0–36.0)
MCV: 70 fL — ABNORMAL LOW (ref 78.0–100.0)
PLATELETS: 299 10*3/uL (ref 150–400)
RBC: 4.53 MIL/uL (ref 4.22–5.81)
RDW: 13.8 % (ref 11.5–15.5)
WBC: 13.4 10*3/uL — ABNORMAL HIGH (ref 4.0–10.5)

## 2013-02-28 LAB — BASIC METABOLIC PANEL
BUN: 12 mg/dL (ref 6–23)
CALCIUM: 7.6 mg/dL — AB (ref 8.4–10.5)
CO2: 29 mEq/L (ref 19–32)
CREATININE: 0.85 mg/dL (ref 0.50–1.35)
Chloride: 103 mEq/L (ref 96–112)
GFR, EST NON AFRICAN AMERICAN: 87 mL/min — AB (ref >90)
GLUCOSE: 102 mg/dL — AB (ref 70–99)
Potassium: 3.5 mEq/L — ABNORMAL LOW (ref 3.7–5.3)
Sodium: 141 mEq/L (ref 137–147)

## 2013-02-28 LAB — GLUCOSE, CAPILLARY
GLUCOSE-CAPILLARY: 132 mg/dL — AB (ref 70–99)
Glucose-Capillary: 91 mg/dL (ref 70–99)
Glucose-Capillary: 75 mg/dL (ref 70–99)
Glucose-Capillary: 149 mg/dL — ABNORMAL HIGH (ref 70–99)
Glucose-Capillary: 128 mg/dL — ABNORMAL HIGH (ref 70–99)

## 2013-02-28 MED ORDER — HYDROCORTISONE NA SUCCINATE PF 100 MG IJ SOLR
20.0000 mg | Freq: Every day | INTRAMUSCULAR | Status: DC
  Administered 2013-03-01: 20 mg via INTRAVENOUS
  Filled 2013-02-28: qty 0.4

## 2013-02-28 NOTE — Progress Notes (Signed)
Physical Therapy Wound Treatment Patient Details  Name: Jason [name] MRN: [id number] Date of Birth: 02-07-1945  Today's Date: 02/28/2013 Time: 4098-1191 Time Calculation (min): 32 min  Subjective  Date of Onset:  (Deep tissue injury prior to adm))  Pain Score:   pt flinches when areas of yellow eschar treated  Wound Assessment  Pressure Ulcer Unstageable - Full thickness tissue loss in which the base of the ulcer is covered by slough (yellow, tan, gray, green or brown) and/or eschar (tan, brown or black) in the wound bed. (Active)  Dressing Type Moist to dry;Other (Comment);ABD;Tape dressing;Barrier Film (skin prep) 02/28/2013  9:13 AM  Dressing Changed 02/28/2013  9:13 AM  Dressing Change Frequency Daily 02/28/2013  9:13 AM  State of Healing Eschar 02/28/2013  9:13 AM  Site / Wound Assessment Black;Brown;Granulation tissue;Painful;Yellow;Other (Comment) 02/28/2013  9:13 AM  % Wound base Red or Granulating 5% 02/28/2013  9:13 AM  % Wound base Yellow 10% 02/28/2013  9:13 AM  % Wound base Black 85% 02/28/2013  9:13 AM  % Wound base Other (Comment) 0% 02/28/2013  9:13 AM  Peri-wound Assessment Intact 02/28/2013  9:13 AM  Wound Length (cm) 10 cm 02/27/2013  1:15 PM  Wound Width (cm) 11 cm 02/27/2013  1:15 PM  Wound Depth (cm) 0.2 cm 02/27/2013  1:15 PM  Margins Unattached edges (unapproximated) 02/28/2013  9:13 AM  Drainage Amount Scant 02/28/2013  9:13 AM  Drainage Description Other (Comment) 02/28/2013  9:13 AM  Treatment Debridement (Selective);Hydrotherapy (Pulse lavage);Packing (Saline gauze);Tape changed 02/28/2013  9:13 AM   Hydrotherapy Pulsed lavage therapy - wound location: sacrum Pulsed Lavage with Suction (psi): 4 psi Pulsed Lavage with Suction - Normal Saline Used: 1000 mL Pulsed Lavage Tip: Tip with splash shield Selective Debridement Selective Debridement - Location: sacrum Selective Debridement - Tools Used: Scalpel;Forceps Selective Debridement - Tissue Removed: black and yellow  eschar; also scored remaining eschar   Wound Assessment and Plan  Wound Therapy - Assess/Plan/Recommendations Wound Therapy - Clinical Statement: Pt presents to hydrotherapy with unstageable pressure ulcer on sacrum. Needs hydrotherapy to decrease eschar and promote wound healing. Wound Therapy - Functional Problem List: decr sitting tolerance. Factors Delaying/Impairing Wound Healing: Incontinence;Immobility;Multiple medical problems;Polypharmacy;Tobacco use Hydrotherapy Plan: Debridement;Dressing change;Patient/family education;Pulsatile lavage with suction Wound Therapy - Frequency: 6X / week Wound Therapy - Follow Up Recommendations: Skilled nursing facility Wound Plan: See plan  Wound Therapy Goals- Improve the function of patient's integumentary system by progressing the wound(s) through the phases of wound healing (inflammation - proliferation - remodeling) by: Decrease Necrotic Tissue to: 80% Decrease Necrotic Tissue - Progress: Progressing toward goal Increase Granulation Tissue to: 20% Increase Granulation Tissue - Progress: Progressing toward goal  Goals will be updated until maximal potential achieved or discharge criteria met.  Discharge criteria: when goals achieved, discharge from hospital, MD decision/surgical intervention, no progress towards goals, refusal/missing three consecutive treatments without notification or medical reason.  GP     Maxine Huynh 02/28/2013, 9:25 AM Pager [phone]

## 2013-02-28 NOTE — Progress Notes (Signed)
PATIENT DETAILS Name: Jason [name] Age: 68 y.o. Sex: male Date of Birth: [birth year] Admit Date: 02/18/2013 Admitting Physician Juanito Doom, MD [id number] Hart Carwin, MD  Subjective: No major issues overnight. Patient has no complaints.  Assessment/Plan: Severe Sepsis with possible necrotizing fasciitis of the rectus muscle and left internal obturator  currently hemodynamically stable - sepsis resolved.  Status post exploratory laparotomy and drainage by general surgery and urology on 02/18/2013,  status post intubation and extubation was extubated on 02/20/2013.  JP drain was removed on 2/24 by general surgery  Penrose drains are being managed by Urology. Per Dr. Louis Meckel these drains will be very slowly backed out in order to prevent another abscess from forming. This will most likely take until Monday.  Patient receiving Wet to Dry dressing changes.  Leave foley catheter in place for at least 2 weeks post op per Urology.  Taper solucortef to 20 mg daily  E-coli Bacteremia  1 of 2 blood cultures drawn 2/18 positive for e-coli.  Antibiotics are being managed by infectious disease and have been narrowed to Unasyn as of February 23.  Per ID he will need several more weeks of IV antibiotic therapy.  Repeat blood cultures ordered 2/24. These show NGTD.  PICC placed on 2/26.  HCAP / Aspiration Pneumonia  Resolved. Was initially treated with Vancomycin.  This has since been narrowed to Unasyn by Infectious Disease.  Bronch evidence of HCAP with report suggesting possible aspiration.  Speech evaluation completed. Regular diet with thin liquids recommended.  Pulmonary toiletry, chest PT, Oxygen, nebulizer treatments as needed.  Hiccups  Started 2/24  Resolved 2/26 on Thorazine. Changed Thorazine to QID PRN Hiccups.  Acute renal failure.  Due to severe sepsis.  Resolved with IV fluids  Hypokalemia.  Resolved after supplementation.  Will continue to monitor  intermittently.  Dementia.  Will remain at risk for delirium.  Supportive care, avoid benzos.  History of alcohol abuse.  Counseled to quit alcohol.  Folate, thiamine and multivitamins. No signs of DTs.  History of Hematuria and Urothelial carcinoma  follows with urology at [facility], post discharge he will follow with them.   Sacral decub  10 cm x 10 cm in size. Recommendations left by [place] on 2/19.  Air Flow Mattress ordered for weight distribution.  WOC added enzymatic debridement ointment and PT for hydrotherapy to clear the wound bed. When transfer to SNF, Lindsey suggests daily Santyl, cover with moist gauze, and foam. Change daily and follow up in a wound care center.  Disposition: Remain inpatient  DVT Prophylaxis Prophylactic heparin  Code Status: Full code   Family Communication None at bedside  Procedures: 2/18 US Renal: No hydronephrosis.  2/18 Exploratory laparotomy with drainage of retroperitoneal and preperitoneal soft tissue infection  2/19 Intubated.  2/19 Bronchoscopy,Sent purulent fluid  2/20 extubated  2/26 PICC placed   CONSULTS:  ID, nephrology, general surgery and urology  Time spent 40 minutes-which includes 50% of the time with face-to-face with patient/ family and coordinating care related to the above assessment and plan.    MEDICATIONS: Scheduled Meds: . albuterol  2.5 mg Nebulization BID  . antiseptic oral rinse  15 mL Mouth Rinse QID  . cefTRIAXone (ROCEPHIN)  IV  1 g Intravenous Q24H  . chlorhexidine  15 mL Mouth Rinse BID  . collagenase   Topical Daily  . feeding supplement (GLUCERNA SHAKE)  237 mL Oral Q24H  . folic acid  1 mg Oral Daily  .  heparin subcutaneous  5,000 Units Subcutaneous 3 times per day  . hydrocortisone sod succinate (SOLU-CORTEF) inj  25 mg Intravenous Daily  . insulin aspart  0-9 Units Subcutaneous 6 times per day  . iron polysaccharides  150 mg Oral Daily  . metroNIDAZOLE  500 mg Oral 3 times per day  .  multivitamin with minerals  1 tablet Oral Daily  . pantoprazole  40 mg Oral Q1200  . sodium chloride  1,000 mL Intravenous Once  . sodium chloride  3 mL Intravenous Q12H  . thiamine  100 mg Oral Daily   Continuous Infusions: . sodium chloride 10 mL/hr (02/26/13 1303)  . dextrose 60 mL (02/28/13 0950)  . fentaNYL infusion INTRAVENOUS Stopped (02/20/13 0746)  . norepinephrine (LEVOPHED) Adult infusion    . phenylephrine (NEO-SYNEPHRINE) Adult infusion     PRN Meds:.acetaminophen, acetaminophen, chlorproMAZINE, fentaNYL, fentaNYL, food thickener, metoprolol, ondansetron (ZOFRAN) IV, ondansetron, sodium chloride  Antibiotics: Anti-infectives   Start     Dose/Rate Route Frequency Ordered Stop   02/27/13 2200  metroNIDAZOLE (FLAGYL) tablet 500 mg     500 mg Oral 3 times per day 02/27/13 1504     02/27/13 1600  cefTRIAXone (ROCEPHIN) 1 g in dextrose 5 % 50 mL IVPB     1 g 100 mL/hr over 30 Minutes Intravenous Every 24 hours 02/27/13 1504     02/23/13 1200  Ampicillin-Sulbactam (UNASYN) 3 g in sodium chloride 0.9 % 100 mL IVPB  Status:  Discontinued     3 g 100 mL/hr over 60 Minutes Intravenous Every 6 hours 02/23/13 1109 02/27/13 1504   02/22/13 1600  piperacillin-tazobactam (ZOSYN) IVPB 3.375 g  Status:  Discontinued     3.375 g 12.5 mL/hr over 240 Minutes Intravenous Every 8 hours 02/22/13 1516 02/23/13 1109   02/22/13 1200  vancomycin (VANCOCIN) IVPB 1000 mg/200 mL premix  Status:  Discontinued     1,000 mg 200 mL/hr over 60 Minutes Intravenous Every 12 hours 02/22/13 1111 02/23/13 1109   02/19/13 2300  vancomycin (VANCOCIN) IVPB 750 mg/150 ml premix  Status:  Discontinued     750 mg 150 mL/hr over 60 Minutes Intravenous Every 12 hours 02/19/13 1436 02/20/13 0943   02/19/13 1500  meropenem (MERREM) 1 g in sodium chloride 0.9 % 100 mL IVPB  Status:  Discontinued     1 g 200 mL/hr over 30 Minutes Intravenous 3 times per day 02/19/13 1436 02/22/13 1455   02/19/13 1000  vancomycin  (VANCOCIN) IVPB 750 mg/150 ml premix  Status:  Discontinued     750 mg 150 mL/hr over 60 Minutes Intravenous Every 24 hours 02/18/13 1031 02/19/13 1437   02/18/13 1800  clindamycin (CLEOCIN) IVPB 900 mg  Status:  Discontinued     900 mg 100 mL/hr over 30 Minutes Intravenous 4 times per day 02/18/13 1626 02/20/13 0943   02/18/13 1800  meropenem (MERREM) 500 mg in sodium chloride 0.9 % 50 mL IVPB  Status:  Discontinued     500 mg 100 mL/hr over 30 Minutes Intravenous Every 12 hours 02/18/13 1649 02/19/13 1436   02/18/13 1200  piperacillin-tazobactam (ZOSYN) IVPB 2.25 g  Status:  Discontinued     2.25 g 100 mL/hr over 30 Minutes Intravenous 4 times per day 02/18/13 1026 02/18/13 1626   02/18/13 1100  vancomycin (VANCOCIN) 1,500 mg in sodium chloride 0.9 % 500 mL IVPB     1,500 mg 250 mL/hr over 120 Minutes Intravenous  Once 02/18/13 1026 02/18/13 1300   02/18/13 0800  cefTRIAXone (ROCEPHIN) 1 g in dextrose 5 % 50 mL IVPB     1 g Intravenous  Once 02/18/13 0752 02/18/13 0848       PHYSICAL EXAM: Vital signs in last 24 hours: Filed Vitals:   02/27/13 1405 02/27/13 1959 02/28/13 0500 02/28/13 0826  BP: 125/69 122/57 119/63   Pulse: 98 86 79   Temp: 98.3 F (36.8 C) 97.8 F (36.6 C) 98.5 F (36.9 C)   TempSrc: Oral Oral Oral   Resp: 20 18 18    Height:      Weight:   71.668 kg (158 lb)   SpO2: 96% 97% 93% 91%    Weight change: 1.768 kg (3 lb 14.4 oz) Filed Weights   02/26/13 0354 02/27/13 0427 02/28/13 0500  Weight: 71.4 kg (157 lb 6.5 oz) 69.9 kg (154 lb 1.6 oz) 71.668 kg (158 lb)   Body mass index is 21.42 kg/(m^2).   Gen Exam: Awake and alert with clear speech.   Neck: Supple, No JVD.   Chest: B/L Clear.   CVS: S1 S2 Regular, no murmurs. Abdomen: soft, BS +, non tender, non distended.  Extremities: no edema, lower extremities warm to touch. Dressing in place. Neurologic: Non Focal.  Skin: No Rash.     Intake/Output from previous day:  Intake/Output Summary (Last 24  hours) at 02/28/13 1258 Last data filed at 02/28/13 [id number]  Gross per 24 hour  Intake   1145 ml  Output   3400 ml  Net  -2255 ml     LAB RESULTS: CBC  Recent Labs Lab 02/23/13 0640 02/24/13 0542 02/25/13 0520 02/26/13 0438 02/28/13 0512  WBC 27.6* 21.9* 18.9* 16.5* 13.4*  HGB 11.7* 12.3* 10.4* 10.7* 10.8*  HCT 33.5* 36.3* 30.9* 31.7* 31.7*  PLT 253 289 285 308 299  MCV 68.9* 70.2* 69.8* 69.5* 70.0*  MCH 24.1* 23.8* 23.5* 23.5* 23.8*  MCHC 34.9 33.9 33.7 33.8 34.1  RDW 13.0 13.4 13.0 13.3 13.8    Chemistries   Recent Labs Lab 02/23/13 0640 02/24/13 0542 02/25/13 0520 02/26/13 0438 02/28/13 0512  NA 143 141 139 140 141  K 3.9 3.3* 3.5* 3.4* 3.5*  CL 105 101 101 102 103  CO2 29 32 28 30 29   GLUCOSE 127* 92 115* 126* 102*  BUN 22 16 16 12 12   CREATININE 0.84 0.89 0.71 0.76 0.85  CALCIUM 7.7* 7.8* 7.4* 7.4* 7.6*    CBG:  Recent Labs Lab 02/27/13 1726 02/27/13 1956 02/27/13 2332 02/28/13 0352 02/28/13 0808  GLUCAP 129* 105* 120* 91 75    GFR Estimated Creatinine Clearance: 83.2 ml/min (by C-G formula based on Cr of 0.85).  Coagulation profile No results found for this basename: INR, PROTIME,  in the last 168 hours  Cardiac Enzymes No results found for this basename: CK, CKMB, TROPONINI, MYOGLOBIN,  in the last 168 hours  No components found with this basename: POCBNP,  No results found for this basename: DDIMER,  in the last 72 hours No results found for this basename: HGBA1C,  in the last 72 hours No results found for this basename: CHOL, HDL, LDLCALC, TRIG, CHOLHDL, LDLDIRECT,  in the last 72 hours No results found for this basename: TSH, T4TOTAL, FREET3, T3FREE, THYROIDAB,  in the last 72 hours No results found for this basename: VITAMINB12, FOLATE, FERRITIN, TIBC, IRON, RETICCTPCT,  in the last 72 hours No results found for this basename: LIPASE, AMYLASE,  in the last 72 hours  Urine Studies No results found for this basename:  UACOL, UAPR,  USPG, UPH, UTP, UGL, UKET, UBIL, UHGB, UNIT, UROB, ULEU, UEPI, UWBC, URBC, UBAC, CAST, CRYS, UCOM, BILUA,  in the last 72 hours  MICROBIOLOGY: Recent Results (from the past 240 hour(s))  AFB CULTURE WITH SMEAR     Status: None   Collection Time    02/19/13 12:28 AM      Result Value Ref Range Status   Specimen Description ABSCESS ABDOMEN   Final   Special Requests PRE PERITONEAL   Final   ACID FAST SMEAR     Final   Value: NO ACID FAST BACILLI SEEN     Performed at [facility]   Culture     Final   Value: CULTURE WILL BE EXAMINED FOR 6 WEEKS BEFORE ISSUING A FINAL REPORT     Performed at [facility]   Report Status PENDING   Incomplete  ANAEROBIC CULTURE     Status: None   Collection Time    02/19/13 12:30 AM      Result Value Ref Range Status   Specimen Description ABSCESS ABDOMEN   Final   Special Requests PRE PERITONEAL   Final   Gram Stain     Final   Value: ABUNDANT WBC PRESENT,BOTH PMN AND MONONUCLEAR     NO SQUAMOUS EPITHELIAL CELLS SEEN     MODERATE GRAM NEGATIVE RODS     Performed at [facility]   Culture     Final   Value: NO ANAEROBES ISOLATED     Performed at [facility]   Report Status 02/24/2013 FINAL   Final  CULTURE, ROUTINE-ABSCESS     Status: None   Collection Time    02/19/13 12:31 AM      Result Value Ref Range Status   Specimen Description ABSCESS ABDOMEN   Final   Special Requests PRE PERITONEAL   Final   Gram Stain     Final   Value: ABUNDANT WBC PRESENT,BOTH PMN AND MONONUCLEAR     NO SQUAMOUS EPITHELIAL CELLS SEEN     MODERATE GRAM NEGATIVE RODS     Performed at [facility]   Culture     Final   Value: MODERATE ESCHERICHIA COLI     Performed at [facility]   Report Status 02/21/2013 FINAL   Final   Organism ID, Bacteria ESCHERICHIA COLI   Final  FUNGUS CULTURE W SMEAR     Status: None   Collection Time    02/19/13 12:33 AM      Result Value Ref Range Status   Specimen Description ABSCESS  ABDOMEN   Final   Special Requests PRE PERITONEAL   Final   Fungal Smear     Final   Value: NO YEAST OR FUNGAL ELEMENTS SEEN     Performed at [facility]   Culture     Final   Value: CULTURE IN PROGRESS FOR FOUR WEEKS     Performed at [facility]   Report Status PENDING   Incomplete  CULTURE, RESPIRATORY (NON-EXPECTORATED)     Status: None   Collection Time    02/19/13  5:38 AM      Result Value Ref Range Status   Specimen Description TRACHEAL ASPIRATE   Final   Special Requests NONE   Final   Gram Stain     Final   Value: NO WBC SEEN     NO SQUAMOUS EPITHELIAL CELLS SEEN     NO ORGANISMS SEEN  Performed at [facility]     Final   Value: Non-Pathogenic Oropharyngeal-type Flora Isolated.     Performed at [facility]   Report Status 02/21/2013 FINAL   Final  CULTURE, RESPIRATORY (NON-EXPECTORATED)     Status: None   Collection Time    02/19/13  9:53 AM      Result Value Ref Range Status   Specimen Description TRACHEAL ASPIRATE   Final   Special Requests NONE   Final   Gram Stain     Final   Value: FEW WBC PRESENT, PREDOMINANTLY PMN     RARE SQUAMOUS EPITHELIAL CELLS PRESENT     RARE GRAM POSITIVE COCCI     IN PAIRS     Performed at [facility]   Culture     Final   Value: Non-Pathogenic Oropharyngeal-type Flora Isolated.     Performed at [facility]   Report Status 02/21/2013 FINAL   Final  CULTURE, BLOOD (ROUTINE X 2)     Status: None   Collection Time    02/24/13  3:00 PM      Result Value Ref Range Status   Specimen Description BLOOD LEFT ARM   Final   Special Requests BOTTLES DRAWN AEROBIC ONLY 6CC   Final   Culture  Setup Time     Final   Value: 02/24/2013 22:10     Performed at [facility]   Culture     Final   Value:        BLOOD CULTURE RECEIVED NO GROWTH TO DATE CULTURE WILL BE HELD FOR 5 DAYS BEFORE ISSUING A FINAL NEGATIVE REPORT     Performed at [facility]   Report  Status PENDING   Incomplete  CULTURE, BLOOD (ROUTINE X 2)     Status: None   Collection Time    02/24/13  3:10 PM      Result Value Ref Range Status   Specimen Description BLOOD LEFT HAND   Final   Special Requests BOTTLES DRAWN AEROBIC ONLY [facility]   Final   Culture  Setup Time     Final   Value: 02/24/2013 22:11     Performed at [facility]   Culture     Final   Value:        BLOOD CULTURE RECEIVED NO GROWTH TO DATE CULTURE WILL BE HELD FOR 5 DAYS BEFORE ISSUING A FINAL NEGATIVE REPORT     Performed at [facility]   Report Status PENDING   Incomplete    RADIOLOGY STUDIES/RESULTS: Ct Abdomen Pelvis Wo Contrast  02/18/2013   CLINICAL DATA:  Fever. Recent transurethral resection of bladder tumor. Extraluminal gas collection in the left side of the pelvis.  EXAM: CT ABDOMEN AND PELVIS WITHOUT CONTRAST  TECHNIQUE: Multidetector CT imaging of the abdomen and pelvis was performed following the standard protocol without intravenous contrast.  COMPARISON:  Radiograph dated 02/18/2013 and CT scan dated 08/30/2006  FINDINGS: There are bilateral extensive lower lobe consolidative pulmonary infiltrates. No effusions. Heart size is normal. Tiny hiatal hernia.  There is extensive extraluminal air in the soft tissues of the pelvis including the presacral space and along the left side of the pelvis along the fascia of the left antral operator muscle as well as extending anteriorly along the deep fascia of the rectus muscles. This is consistent with and anaerobic infection and possible necrotizing fasciitis.  The liver, biliary tree, spleen, pancreas, adrenal glands, and kidneys  demonstrate no significant abnormality. The bowel appears normal. Foley catheter is in place in the bladder with a small amount of air in the bladder.  IMPRESSION: 1. Evidence consistent with anaerobic soft tissue infection in the pelvis with extension to the fascia of the rectus muscles and of the left internal obturator  muscle consistent with necrotizing fasciitis. No visible pus collections in the pelvis. 2. Extensive bilateral lower lobe pneumonia.  Critical Value/emergent results were called by telephone at the time of interpretation on 02/18/2013 at 3:55 PM to Dr. Murray Hodgkins , who verbally acknowledged these results.   Electronically Signed   By: Rozetta Nunnery M.D.   On: 02/18/2013 15:56   Dg Chest 2 View  02/23/2013   CLINICAL DATA:  68 year old male with cough, congestion and pneumonia.  EXAM: CHEST  2 VIEW  COMPARISON:  02/22/2013  FINDINGS: Cardiomediastinal silhouette is stable.  Bilateral lower lung atelectasis/ airspace disease/ consolidation noted with probable bilateral pleural effusions.  There is no evidence of pneumothorax, pulmonary edema or acute bony abnormality.  IMPRESSION: Unchanged bilateral lower lung atelectasis/ airspace disease/ consolidation with probable bilateral pleural effusions.   Electronically Signed   By: Hassan Rowan M.D.   On: 02/23/2013 01:45   US Renal  02/18/2013   CLINICAL DATA:  Acute renal failure  EXAM: RENAL/URINARY TRACT ULTRASOUND COMPLETE  COMPARISON:  01/13/2013  FINDINGS: Right Kidney:  Length: 11.2 cm.  No mass or hydronephrosis.  Left Kidney:  Length: 11.0 cm.  No mass or hydronephrosis.  Bladder:  Poorly visualized/decompressed by indwelling Foley catheter.  IMPRESSION: No hydronephrosis.  Bladder poorly visualized/decompressed by indwelling Foley catheter.   Electronically Signed   By: Julian Hy M.D.   On: 02/18/2013 13:36   Dg Chest Port 1 View  02/22/2013   CLINICAL DATA:  Basilar airspace disease and effusions  EXAM: PORTABLE CHEST - 1 VIEW  COMPARISON:  02/21/2013  FINDINGS: Normal heart size and vascularity. Persistent bibasilar atelectasis/consolidation noted with pleural effusions. Given changes in technique, no significant interval change. No pneumothorax. Trachea is midline.  IMPRESSION: Persistent bibasilar atelectasis/consolidation with associated  pleural effusions.   Electronically Signed   By: Daryll Brod M.D.   On: 02/22/2013 08:05   Dg Chest Port 1 View  02/21/2013   CLINICAL DATA:  Shortness of breath.  Marland Kitchen  EXAM: PORTABLE CHEST - 1 VIEW  COMPARISON:  DG CHEST 1V PORT dated 02/21/2013  FINDINGS: Mediastinum and hilar structures are normal. Progressive atelectasis right lower lobe. Developing infiltrate left lower lobe. Bilateral pleural effusions. No pneumothorax. Heart size normal. No acute osseous abnormality.  IMPRESSION: 1.  Progressive atelectasis and/or infiltrate right lower lobe.  2.  Developing infiltrate left lower lobe.  3.  Bilateral small pleural effusions.   Electronically Signed   By: Marcello Moores  Register   On: 02/21/2013 23:39   Dg Chest Port 1 View  02/21/2013   CLINICAL DATA:  Pneumonia, extubated  EXAM: PORTABLE CHEST - 1 VIEW  COMPARISON:  02/20/2013  FINDINGS: Interval extubation. Right IJ central line and NG tube have also been removed. No pneumothorax. Slight worsening basilar airspace disease and left lower lobe consolidation/ collapse. Small effusions not excluded. Stable upper lobe aeration.  IMPRESSION: Interval extubation.  Slight worsening basilar airspace process and left lower lobe collapse/consolidation, compatible with pneumonia.   Electronically Signed   By: Daryll Brod M.D.   On: 02/21/2013 07:42   Dg Chest Port 1 View  02/20/2013   CLINICAL DATA:  68 year old male intubated. Sepsis.  Initial encounter.  EXAM: PORTABLE CHEST - 1 VIEW  COMPARISON:  02/1913 and earlier.  FINDINGS: Portable AP semi upright view at 0527 hr. Endotracheal tube tip projects below the clavicles now all, about 3 cm above the carina. Enteric tube courses to the left upper quadrant, tip not included. Stable right IJ central line.  Improved bibasilar ventilation with better definition of the diaphragm. Residual patchy and reticular nodular medial lung base density greater on the right. No pneumothorax. No edema. No definite effusion. Stable  cardiac size and mediastinal contours.  IMPRESSION: 1. Lines and tubes appear appropriately placed as above. 2. Improved ventilation. Residual medial lung base opacity greater on the right.   Electronically Signed   By: Lars Pinks M.D.   On: 02/20/2013 07:26   Dg Chest Port 1 View  02/19/2013   CLINICAL DATA:  Repositioning of endotracheal tube.  EXAM: PORTABLE CHEST - 1 VIEW  COMPARISON:  DG CHEST 1V PORT dated 02/19/2013  FINDINGS: The endotracheal tube tip now lies 5.7 cm above the crotch of the carina. The lungs are well-expanded. Increased density at the right lung base persists and is consistent with atelectasis or pneumonia. The cardiac silhouette is not enlarged. The pulmonary vascularity is not engorged. The esophagogastric tube tip and proximal port project below the inferior margin of the image. The right internal jugular venous catheter tip lies in the region of the midportion of the SVC.  IMPRESSION: There has been adequate interval repositioning of the endotracheal tube. The remainder of the examination is unchanged.   Electronically Signed   By: David  Martinique   On: 02/19/2013 10:26   Dg Chest Port 1 View  02/19/2013   CLINICAL DATA:  Post bronchoscopy  EXAM: PORTABLE CHEST - 1 VIEW  COMPARISON:  Portable exam 0952 hr compared to the 0026 hr  FINDINGS: Tip of endotracheal tube 11 cm above carina.  Right jugular central venous catheter tip projects over SVC.  Normal heart size and mediastinal contours.  Bibasilar opacities question infiltrate versus atelectasis greater on right.  Upper lobes clear.  No pleural effusion or pneumothorax.  IMPRESSION: Bibasilar opacities greater on right question atelectasis versus infiltrate.  High endotracheal tube position 11 cm above carina, consider advancing tube 5 cm for more stable positioning within the thoracic trachea.  Findings called to [facility] on [facility] [place] on 02/19/2013 at 1004 hr.   Electronically Signed   By: Lavonia Dana M.D.   On: 02/19/2013 10:04    Dg Chest Port 1 View  02/19/2013   CLINICAL DATA:  Endotracheal tube placement  EXAM: PORTABLE CHEST - 1 VIEW  COMPARISON:  DG CHEST 1V PORT dated 02/18/2013  FINDINGS: Endotracheal tube is positioned 6 cm from the carina. NG to extends into the stomach. Side port is just above the GE junction. Right central venous line placement of the distal SVC.  There is new right lower lobe atelectasis.  Upper lungs are clear.  IMPRESSION: 1. Endotracheal tube appears in good position. 2. New right lower lobe atelectasis. 3. Interval placement NG tube with side port is just above GE junction.   Electronically Signed   By: Suzy Bouchard M.D.   On: 02/19/2013 00:46   Dg Chest Portable 1 View  02/18/2013   CLINICAL DATA:  Evaluate right central venous line insertion  EXAM: PORTABLE CHEST - 1 VIEW  COMPARISON:  DG CHEST 1V PORT dated 02/18/2013  FINDINGS: There is a right central venous line with tip in the distal SVC.  No pneumothorax. Normal cardiac silhouette. There is bibasilar airspace disease greater on the right which is not to be changed from prior. No upper lobe airspace disease.  IMPRESSION: 1. Interval placement right central venous line without complication. 2. Bibasilar asymmetric airspace disease is not changed.   Electronically Signed   By: Suzy Bouchard M.D.   On: 02/18/2013 21:19   Dg Chest Portable 1 View  02/18/2013   CLINICAL DATA:  Hypoxia, COPD  EXAM: PORTABLE CHEST - 1 VIEW  COMPARISON:  01/27/2013  FINDINGS: Cardiac shadow is stable. Bilateral basilar infiltrates are now seen new from the prior exam. The bony structures are within normal limits.  IMPRESSION: Bibasilar infiltrates.   Electronically Signed   By: Inez Catalina M.D.   On: 02/18/2013 08:28   Dg Abd 2 Views  02/22/2013   CLINICAL DATA:  Abdominal pain  EXAM: ABDOMEN - 2 VIEW  COMPARISON:  CT ABD/PELV WO CM dated 02/18/2013  FINDINGS: There is no evidence of free intraperitoneal gas. Mild distention of small and large bowel loops in  and ileus pattern are noted. Surgical drain projects over the pelvis. Multiple Penrose drains project over the pelvis.  IMPRESSION: No free intraperitoneal gas.  Ileus.  Drains project over the pelvis.   Electronically Signed   By: Maryclare Bean M.D.   On: 02/22/2013 13:11   Dg Abd 2 Views  02/18/2013   CLINICAL DATA:  Bladder tumor, urothelial carcinoma, possible vomiting, post TURBT 02/13/2013  EXAM: ABDOMEN - 2 VIEW  COMPARISON:  None  FINDINGS: Bibasilar infiltrates question pneumonia versus aspiration.  Air-filled nondistended small bowel loops the mid abdomen, question postoperative ileus.  Bubbly gas identified in the pelvis, could be related to stool within the distal colon but extending farther left lateral than is typically seen with rectal vault stool, raising question of extraperitoneal gas in the left pelvis.  Bones unremarkable.  No urinary tract calcification.  No definite free intraperitoneal air.  IMPRESSION: Bibasilar opacities question pneumonia versus aspiration.  Potential postoperative ileus.  Bubbly gas extending farther left laterally in the inferior pelvis than is typically seen with stool, question prominent stool versus extraperitoneal extraluminal gas in patient who has had a recent cystoscopy and TURBT ; CT imaging of the abdomen and pelvis with IV and oral contrast recommended.  Findings called to Dr. Sarajane Jews on 02/18/2013 at 1315 hr.   Electronically Signed   By: Lavonia Dana M.D.   On: 02/18/2013 13:16   Dg Swallowing Func-speech Pathology  02/23/2013   Katherene Ponto Deblois, CCC-SLP     02/23/2013 10:49 AM Objective Swallowing Evaluation: Modified Barium Swallowing Study   Patient Details  Name: KASSON LAMERE MRN: [id number] Date of Birth: 04-17-1945  Today's Date: 02/23/2013 Time: 1025-8527 SLP Time Calculation (min): 20 min  Past Medical History:  Past Medical History  Diagnosis Date  . Hypertension   . Stroke     Dementia, otherwise, no residual  . Bladder mass 02-10-13    surgery  planned for this  . 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 COPD (chronic obstructive pulmonary disease)     previous history and current smoking  . 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 Dementia 02/18/2013   Past Surgical History:  Past Surgical History  Procedure Laterality Date  . Transurethral resection of bladder tumor with gyrus  (turbt-gyrus) N/A 02/13/2013    Procedure: TRANSURETHRAL RESECTION OF BLADDER TUMOR WITH GYRUS  (TURBT-GYRUS), bladder biopsies;  Surgeon: Ardis Hughs,  MD;  Location: [facility];  Service: Urology;  Laterality: N/A;  . Cystoscopy/retrograde/ureteroscopy Bilateral 02/13/2013    Procedure: BILATERAL RETROGRADE;  Surgeon: Ardis Hughs,  MD;  Location: [facility];  Service: Urology;  Laterality: Bilateral;   . Laparotomy N/A 02/18/2013    Procedure: EXPLORATORY LAPAROTOMY drainage of retroperitoneal  abscess, drainage of Pre-Peritoneal abscess and Exploration of  bladder perforation;  Surgeon: Imogene Burn. Tsuei, MD;  Location:  [facility];  Service: General;  Laterality: N/A;   HPI:  Severe Sepsis with possible necrotizing fasciitis of the rectus  muscle and left internal obturator , also suspected HCAP -  currently hemodynamically stable, status post exploratory  laparotomy and drainage by general surgery and urology on  02/18/2013, status post intubation and extubation was extubated  on 02/20/2013.      Assessment / Plan / Recommendation Clinical Impression  Clinical impression: Pt demonstrates moderate sensory impairment  impacting safety with large straw sips or with pills given with  liquids. Pts oral phase is characterized by slow pumping and  prolonged mastication of solids, poor posterior containment with  straw sips and struggle to transit pill. Pharyngeal phase is  strong, but there is decreased coordination and timing with straw  sips allowing for silent aspiration during/after the swallow. The  pt is recommended to consume regular solids and cup sips (may be   moderate to large without severe risk), but should not be given  straws and pills must be given whole in puree as there is  significant aspiration of liquids with pills. Fully upright  posture also very important. Recommend OOB if possible. SLP will  follow for tolerance.     Treatment Recommendation  Therapy as outlined in treatment plan below    Diet Recommendation Regular;Thin liquid   Liquid Administration via: No straw;Cup Medication Administration: Whole meds with puree Supervision: Staff to assist with self feeding;Full  supervision/cueing for compensatory strategies Compensations: Slow rate Postural Changes and/or Swallow Maneuvers: Seated upright 90  degrees;Out of bed for meals    Other  Recommendations Oral Care Recommendations: Oral care BID Other Recommendations: Have oral suction available   Follow Up Recommendations  Skilled Nursing facility    Frequency and Duration min 2x/week  2 weeks   Pertinent Vitals/Pain NA    SLP Swallow Goals     General HPI: Severe Sepsis with possible necrotizing fasciitis of  the rectus muscle and left internal obturator , also suspected  HCAP - currently hemodynamically stable, status post exploratory  laparotomy and drainage by general surgery and urology on  02/18/2013, status post intubation and extubation was extubated  on 02/20/2013.  Type of Study: Modified Barium Swallowing Study Reason for Referral: Objectively evaluate swallowing function Diet Prior to this Study: Dysphagia 3 (soft);Nectar-thick liquids Temperature Spikes Noted: No Respiratory Status: Nasal cannula History of Recent Intubation: Yes Length of Intubations (days): 2 days Date extubated: 02/20/13 Behavior/Cognition: Alert;Cooperative;Confused;Decreased  sustained attention Oral Cavity - Dentition: Poor condition;Missing dentition Oral Motor / Sensory Function: Within functional limits Self-Feeding Abilities: Able to feed self Patient Positioning: Upright in chair Baseline Vocal Quality: Hoarse  Volitional Cough: Other (Comment);Weak (requires max cues) Volitional Swallow:  (NT) Anatomy: Within functional limits Pharyngeal Secretions:  (probable standing secretions based on  vocal quality)    Reason for Referral Objectively evaluate swallowing function   Oral Phase Oral Preparation/Oral Phase Oral Phase: Impaired Oral - Thin Oral - Thin Cup: Lingual/palatal residue Oral - Thin Straw: Lingual/palatal residue;Piecemeal swallowing Oral - Solids Oral - Puree: Lingual/palatal residue;Delayed  oral  transit;Reduced posterior propulsion;Lingual pumping Oral - Regular: Reduced posterior propulsion;Delayed oral transit Oral - Pill: Other (Comment) (dramatic posterior head tilt needed  to transit pills)   Pharyngeal Phase Pharyngeal Phase Pharyngeal Phase: Impaired Pharyngeal - Thin Pharyngeal - Thin Cup: Premature spillage to pyriform  sinuses;Pharyngeal residue - valleculae;Pharyngeal residue -  pyriform sinuses (independent second swallow to clear) Pharyngeal - Thin Straw: Premature spillage to pyriform  sinuses;Penetration/Aspiration after swallow;Moderate aspiration Penetration/Aspiration details (thin straw): Material enters  airway, passes BELOW cords without attempt by patient to eject  out (silent aspiration);Material does not enter airway Pharyngeal - Solids Pharyngeal - Puree: Delayed swallow initiation;Premature spillage  to valleculae Pharyngeal - Regular: Delayed swallow initiation;Premature  spillage to valleculae Pharyngeal - Pill: Delayed swallow  initiation;Penetration/Aspiration before swallow;Moderate  aspiration Penetration/Aspiration details (pill): Material enters airway,  passes BELOW cords without attempt by patient to eject out  (silent aspiration)  Cervical Esophageal Phase    GO    Cervical Esophageal Phase Cervical Esophageal Phase: [facility]         DeBlois, Katherene Ponto 02/23/2013, 10:48 AM     Oren Binet, MD  [facility] Pager:[phone]  If 7PM-7AM, please contact  night-coverage [email or web address] Password [facility] 02/28/2013, 12:58 PM   LOS: 10 days

## 2013-02-28 NOTE — Progress Notes (Signed)
Urology Inpatient Progress Report  ID: s/p TURBT for ~4cm bladder tumor on left lateral wall on 0/86/57, complicated by bladder perforation.  Subsequently presented to OSH after removing his catheter accidentally.  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  He is s/p ex. Lap/ pelvis washout/debridement, 02/19/13.      Intv/Subj: No acute events past 24 hours. Patient is without complaint. Pain controlled.     Past Medical History  Diagnosis Date  . Hypertension   . Stroke     Dementia, otherwise, no residual  . Bladder mass 02-10-13    surgery planned for this  . 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 COPD (chronic obstructive pulmonary disease)     previous history and current smoking  . 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 Dementia 02/18/2013   Current Facility-Administered Medications  Medication Dose Route Frequency Provider Last Rate Last Dose  . 0.9 %  sodium chloride infusion   Intravenous Continuous Leone Haven, MD 10 mL/hr at 02/26/13 1303 10 mL/hr at 02/26/13 1303  . acetaminophen (TYLENOL) tablet 650 mg  650 mg Oral Q6H PRN Samuella Cota, MD   650 mg at 02/18/13 1717   Or  . acetaminophen (TYLENOL) suppository 650 mg  650 mg Rectal Q6H PRN Samuella Cota, MD      . albuterol (PROVENTIL) (2.5 MG/3ML) 0.083% nebulizer solution 2.5 mg  2.5 mg Nebulization BID Renee A Kuneff, DO   2.5 mg at 02/27/13 0904  . antiseptic oral rinse (BIOTENE) solution 15 mL  15 mL Mouth Rinse QID Renee A Kuneff, DO   15 mL at 02/27/13 2342  . cefTRIAXone (ROCEPHIN) 1 g in dextrose 5 % 50 mL IVPB  1 g Intravenous Q24H [name], MD   1 g at 02/27/13 1719  . chlorhexidine (PERIDEX) 0.12 % solution 15 mL  15 mL Mouth Rinse BID Renee A Kuneff, DO   15 mL at 02/27/13 2042  . chlorproMAZINE (THORAZINE) tablet 25 mg  25 mg Oral QID PRN Melton Alar, PA-C      . collagenase (SANTYL) ointment   Topical Daily Shanker Kristeen Mans, MD       . dextrose 5 % solution   Intravenous Continuous Renee A Kuneff, DO 60 mL/hr at 02/26/13 1026 60 mL at 02/26/13 1026  . feeding supplement (GLUCERNA SHAKE) (GLUCERNA SHAKE) liquid 237 mL  237 mL Oral Q24H Erlene Quan, RD      . fentaNYL (SUBLIMAZE) 10 mcg/mL in sodium chloride 0.9 % 250 mL infusion  0-400 mcg/hr Intravenous Continuous Renee A Kuneff, DO   50 mcg/hr at 02/19/13 1800  . fentaNYL (SUBLIMAZE) bolus via infusion 25-50 mcg  25-50 mcg Intravenous Q1H PRN Renee A Kuneff, DO      . fentaNYL (SUBLIMAZE) injection 50 mcg  50 mcg Intravenous Q2H PRN Renee A Kuneff, DO   50 mcg at 02/21/13 1700  . folic acid (FOLVITE) tablet 1 mg  1 mg Oral Daily Thurnell Lose, MD   1 mg at 02/27/13 1026  . food thickener (THICK IT) powder   Oral PRN Thurnell Lose, MD      . heparin injection 5,000 Units  5,000 Units Subcutaneous 3 times per day Imogene Burn. Tsuei, MD   5,000 Units at 02/27/13 2304  . hydrocortisone sodium succinate (SOLU-CORTEF) 100 MG injection 25 mg  25 mg Intravenous Daily Marianne L York, PA-C      . insulin  aspart (novoLOG) injection 0-9 Units  0-9 Units Subcutaneous 6 times per day Raylene Miyamoto, MD   1 Units at 02/27/13 1729  . iron polysaccharides (NIFEREX) capsule 150 mg  150 mg Oral Daily Melton Alar, PA-C   150 mg at 02/27/13 1023  . metoprolol (LOPRESSOR) injection 2.5-5 mg  2.5-5 mg Intravenous Q3H PRN Colbert Coyer, MD      . metroNIDAZOLE (FLAGYL) tablet 500 mg  500 mg Oral 3 times per day [name], MD   500 mg at 02/27/13 2304  . multivitamin with minerals tablet 1 tablet  1 tablet Oral Daily Erlene Quan, RD   1 tablet at 02/27/13 1026  . norepinephrine (LEVOPHED) 4 mg in dextrose 5 % 250 mL infusion  2-50 mcg/min Intravenous Titrated Renee A Kuneff, DO      . ondansetron (ZOFRAN) tablet 4 mg  4 mg Oral Q6H PRN Samuella Cota, MD       Or  . ondansetron [facility]) injection 4 mg  4 mg Intravenous Q6H PRN Samuella Cota, MD      .  pantoprazole (PROTONIX) EC tablet 40 mg  40 mg Oral Q1200 Thurnell Lose, MD   40 mg at 02/27/13 1212  . phenylephrine (NEO-SYNEPHRINE) 10 mg in dextrose 5 % 250 mL infusion  30-200 mcg/min Intravenous Titrated Juanito Doom, MD      . sodium chloride 0.9 % bolus 1,000 mL  1,000 mL Intravenous Once Renee A Kuneff, DO      . sodium chloride 0.9 % injection 10-40 mL  10-40 mL Intracatheter PRN Jonetta Osgood, MD   10 mL at 02/27/13 0523  . sodium chloride 0.9 % injection 3 mL  3 mL Intravenous Q12H Samuella Cota, MD   3 mL at 02/26/13 1031  . thiamine (VITAMIN B-1) tablet 100 mg  100 mg Oral Daily Thurnell Lose, MD   100 mg at 02/27/13 1026    Objective: Vital: Filed Vitals:   02/27/13 0427 02/27/13 0906 02/27/13 1405 02/27/13 1959  BP: 115/55  125/69 122/57  Pulse: 66  98 86  Temp: 98.1 F (36.7 C)  98.3 F (36.8 C) 97.8 F (36.6 C)  TempSrc: Oral  Oral Oral  Resp: 16  20 18   Height:      Weight: 69.9 kg (154 lb 1.6 oz)     SpO2: 96% 92% 96% 97%    I/Os: I/O last 3 completed shifts: In: 2844.3 [P.O.:720; I.V.:1474.3; IV Piggyback:650] Out: 2600 [Urine:2600]  Physical Exam:  General: Patient is in no apparent distress Lungs: coarse breath sounds GI: The abdomen is soft a, nontender.  Midline wound opened/packed and starting to granulate.  He has two penrose drains on either side of his pelvis, both with purulent drainage; left drain has been pulled out. Foley is draining clear yellow urine. Ext: lower extremities symmetric  Lab Results:  Recent Labs  02/25/13 0520 02/26/13 0438  WBC 18.9* 16.5*  HGB 10.4* 10.7*  HCT 30.9* 31.7*    Recent Labs  02/25/13 0520 02/26/13 0438  NA 139 140  K 3.5* 3.4*  CL 101 102  CO2 28 30  GLUCOSE 115* 126*  BUN 16 12  CREATININE 0.71 0.76  CALCIUM 7.4* 7.4*   No results found for this basename: LABPT, INR,  in the last 72 hours No results found for this basename: LABURIN,  in the last 72 hours Results for  orders placed during the hospital encounter of 02/18/13  URINE CULTURE     Status: None   Collection Time    02/18/13  7:20 AM      Result Value Ref Range Status   Specimen Description URINE, CLEAN CATCH   Final   Special Requests NONE   Final   Culture  Setup Time     Final   Value: 02/18/2013 10:26     Performed at [facility]     Final   Value: >=100,000 COLONIES/ML     Performed at [facility]   Culture     Final   Value: ESCHERICHIA COLI     Performed at [facility]   Report Status 02/22/2013 FINAL   Final   Organism ID, Bacteria ESCHERICHIA COLI   Final  CULTURE, BLOOD (ROUTINE X 2)     Status: None   Collection Time    02/18/13  8:15 AM      Result Value Ref Range Status   Specimen Description BLOOD SITE NOT SPECIFIED DRAWN BY RN   Final   Special Requests BOTTLES DRAWN AEROBIC AND ANAEROBIC 4CC   Final   Culture NO GROWTH 5 DAYS   Final   Report Status 02/23/2013 FINAL   Final  CULTURE, BLOOD (ROUTINE X 2)     Status: None   Collection Time    02/18/13  8:30 AM      Result Value Ref Range Status   Specimen Description BLOOD RIGHT ARM   Final   Special Requests BOTTLES DRAWN AEROBIC ONLY [facility]   Final   Culture  Setup Time     Final   Value: 02/20/2013 01:21     Performed at [facility]   Culture     Final   Value: ESCHERICHIA COLI     1822 Note: Gram Stain Report Called to,Read Back By and Verified With: Girard Cooter 02/19/2013 [place] Performed at [facility]     Performed at [facility]   Report Status 02/22/2013 FINAL   Final   Organism ID, Bacteria ESCHERICHIA COLI   Final  MRSA PCR SCREENING     Status: None   Collection Time    02/18/13 10:13 AM      Result Value Ref Range Status   MRSA by PCR NEGATIVE  NEGATIVE Final   Comment:            The GeneXpert MRSA Assay (FDA     approved for NASAL specimens     only), is one component of a     comprehensive MRSA colonization     surveillance  program. It is not     intended to diagnose MRSA     infection nor to guide or     monitor treatment for     MRSA infections.  AFB CULTURE WITH SMEAR     Status: None   Collection Time    02/19/13 12:28 AM      Result Value Ref Range Status   Specimen Description ABSCESS ABDOMEN   Final   Special Requests PRE PERITONEAL   Final   ACID FAST SMEAR     Final   Value: NO ACID FAST BACILLI SEEN     Performed at [facility]   Culture     Final   Value: CULTURE WILL BE EXAMINED FOR 6 WEEKS BEFORE ISSUING A FINAL REPORT     Performed at [facility]   Report Status PENDING   Incomplete  ANAEROBIC CULTURE     Status: None   Collection Time    02/19/13 12:30 AM      Result Value Ref Range Status   Specimen Description ABSCESS ABDOMEN   Final   Special Requests PRE PERITONEAL   Final   Gram Stain     Final   Value: ABUNDANT WBC PRESENT,BOTH PMN AND MONONUCLEAR     NO SQUAMOUS EPITHELIAL CELLS SEEN     MODERATE GRAM NEGATIVE RODS     Performed at [facility]   Culture     Final   Value: NO ANAEROBES ISOLATED     Performed at [facility]   Report Status 02/24/2013 FINAL   Final  CULTURE, ROUTINE-ABSCESS     Status: None   Collection Time    02/19/13 12:31 AM      Result Value Ref Range Status   Specimen Description ABSCESS ABDOMEN   Final   Special Requests PRE PERITONEAL   Final   Gram Stain     Final   Value: ABUNDANT WBC PRESENT,BOTH PMN AND MONONUCLEAR     NO SQUAMOUS EPITHELIAL CELLS SEEN     MODERATE GRAM NEGATIVE RODS     Performed at [facility]   Culture     Final   Value: MODERATE ESCHERICHIA COLI     Performed at [facility]   Report Status 02/21/2013 FINAL   Final   Organism ID, Bacteria ESCHERICHIA COLI   Final  FUNGUS CULTURE W SMEAR     Status: None   Collection Time    02/19/13 12:33 AM      Result Value Ref Range Status   Specimen Description ABSCESS ABDOMEN   Final   Special Requests PRE PERITONEAL   Final    Fungal Smear     Final   Value: NO YEAST OR FUNGAL ELEMENTS SEEN     Performed at [facility]   Culture     Final   Value: CULTURE IN PROGRESS FOR FOUR WEEKS     Performed at [facility]   Report Status PENDING   Incomplete  CULTURE, RESPIRATORY (NON-EXPECTORATED)     Status: None   Collection Time    02/19/13  5:38 AM      Result Value Ref Range Status   Specimen Description TRACHEAL ASPIRATE   Final   Special Requests NONE   Final   Gram Stain     Final   Value: NO WBC SEEN     NO SQUAMOUS EPITHELIAL CELLS SEEN     NO ORGANISMS SEEN     Performed at [facility]   Culture     Final   Value: Non-Pathogenic Oropharyngeal-type Flora Isolated.     Performed at [facility]   Report Status 02/21/2013 FINAL   Final  CULTURE, RESPIRATORY (NON-EXPECTORATED)     Status: None   Collection Time    02/19/13  9:53 AM      Result Value Ref Range Status   Specimen Description TRACHEAL ASPIRATE   Final   Special Requests NONE   Final   Gram Stain     Final   Value: FEW WBC PRESENT, PREDOMINANTLY PMN     RARE SQUAMOUS EPITHELIAL CELLS PRESENT     RARE GRAM POSITIVE COCCI     IN PAIRS     Performed at [facility]   Culture     Final   Value: Non-Pathogenic Oropharyngeal-type Flora Isolated.  Performed at [facility]   Report Status 02/21/2013 FINAL   Final  CULTURE, BLOOD (ROUTINE X 2)     Status: None   Collection Time    02/24/13  3:00 PM      Result Value Ref Range Status   Specimen Description BLOOD LEFT ARM   Final   Special Requests BOTTLES DRAWN AEROBIC ONLY 6CC   Final   Culture  Setup Time     Final   Value: 02/24/2013 22:10     Performed at [facility]   Culture     Final   Value:        BLOOD CULTURE RECEIVED NO GROWTH TO DATE CULTURE WILL BE HELD FOR 5 DAYS BEFORE ISSUING A FINAL NEGATIVE REPORT     Performed at [facility]   Report Status PENDING   Incomplete  CULTURE, BLOOD (ROUTINE X 2)      Status: None   Collection Time    02/24/13  3:10 PM      Result Value Ref Range Status   Specimen Description BLOOD LEFT HAND   Final   Special Requests BOTTLES DRAWN AEROBIC ONLY [facility]   Final   Culture  Setup Time     Final   Value: 02/24/2013 22:11     Performed at [facility]   Culture     Final   Value:        BLOOD CULTURE RECEIVED NO GROWTH TO DATE CULTURE WILL BE HELD FOR 5 DAYS BEFORE ISSUING A FINAL NEGATIVE REPORT     Performed at [facility]   Report Status PENDING   Incomplete    Assessment: 10 Days Post-Op from pelvic exploration/washout, improving and making great progress.  E.coli grown from pelvic fluid cultures, blood and urine, resistant to Cipro.  Elevated WBC likely from both PNA and pelvic infection.   Plan:   Per Dr. Louis Meckel: Plan to leave the foley catheter in for at least 2 weeks from time of exploration.    Right penrose backed out ~2 inches today; left was already pulled out likely due to patient movement. Stitch removed from skin along w/ penrose.  Will plan to re-evaluated right drain two days.    Molli Hazard 02/28/2013, 1:41 AM

## 2013-03-01 LAB — GLUCOSE, CAPILLARY
GLUCOSE-CAPILLARY: 86 mg/dL (ref 70–99)
GLUCOSE-CAPILLARY: 157 mg/dL — AB (ref 70–99)
Glucose-Capillary: 100 mg/dL — ABNORMAL HIGH (ref 70–99)
Glucose-Capillary: 98 mg/dL (ref 70–99)
Glucose-Capillary: 123 mg/dL — ABNORMAL HIGH (ref 70–99)
Glucose-Capillary: 157 mg/dL — ABNORMAL HIGH (ref 70–99)

## 2013-03-01 MED ORDER — HYDROCORTISONE NA SUCCINATE PF 100 MG IJ SOLR
15.0000 mg | Freq: Every day | INTRAMUSCULAR | Status: DC
  Administered 2013-03-02: 15 mg via INTRAVENOUS
  Filled 2013-03-01: qty 0.3

## 2013-03-01 MED ORDER — INSULIN ASPART 100 UNIT/ML ~~LOC~~ SOLN
0.0000 [IU] | Freq: Three times a day (TID) | SUBCUTANEOUS | Status: DC
  Administered 2013-03-01 (×2): 2 [IU] via SUBCUTANEOUS
  Administered 2013-03-02: 1 [IU] via SUBCUTANEOUS

## 2013-03-01 NOTE — Progress Notes (Signed)
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  Subsequently presented to OSH after removing his catheter accidentally.  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  He is s/p ex. Lap/ pelvis washout/debridement, 02/19/13.      Intv/Subj: No acute events past 24 hours. Patient is without complaint. Pain controlled.     Past Medical History  Diagnosis Date  . Hypertension   . Stroke     Dementia, otherwise, no residual  . Bladder mass 02-10-13    surgery planned for this  . 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 COPD (chronic obstructive pulmonary disease)     previous history and current smoking  . 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 Dementia 02/18/2013   Current Facility-Administered Medications  Medication Dose Route Frequency Provider Last Rate Last Dose  . 0.9 %  sodium chloride infusion   Intravenous Continuous Leone Haven, MD 10 mL/hr at 02/26/13 1303 10 mL/hr at 02/26/13 1303  . acetaminophen (TYLENOL) tablet 650 mg  650 mg Oral Q6H PRN Samuella Cota, MD   650 mg at 02/18/13 1717   Or  . acetaminophen (TYLENOL) suppository 650 mg  650 mg Rectal Q6H PRN Samuella Cota, MD      . albuterol (PROVENTIL) (2.5 MG/3ML) 0.083% nebulizer solution 2.5 mg  2.5 mg Nebulization BID Renee A Kuneff, DO   2.5 mg at 03/01/13 0905  . antiseptic oral rinse (BIOTENE) solution 15 mL  15 mL Mouth Rinse QID Renee A Kuneff, DO   15 mL at 03/01/13 1247  . cefTRIAXone (ROCEPHIN) 1 g in dextrose 5 % 50 mL IVPB  1 g Intravenous Q24H [name], MD   1 g at 02/28/13 1821  . chlorhexidine (PERIDEX) 0.12 % solution 15 mL  15 mL Mouth Rinse BID Renee A Kuneff, DO   15 mL at 02/28/13 2019  . chlorproMAZINE (THORAZINE) tablet 25 mg  25 mg Oral QID PRN Melton Alar, PA-C      . collagenase (SANTYL) ointment   Topical Daily Jonetta Osgood, MD    1 application at [id number] [phone]  . feeding supplement (GLUCERNA SHAKE) (GLUCERNA SHAKE) liquid 237 mL  237 mL Oral Q24H Erlene Quan, RD   237 mL at 03/01/13 1000  . fentaNYL (SUBLIMAZE) 10 mcg/mL in sodium chloride 0.9 % 250 mL infusion  0-400 mcg/hr Intravenous Continuous Renee A Kuneff, DO   50 mcg/hr at 02/19/13 1800  . fentaNYL (SUBLIMAZE) bolus via infusion 25-50 mcg  25-50 mcg Intravenous Q1H PRN Renee A Kuneff, DO      . fentaNYL (SUBLIMAZE) injection 50 mcg  50 mcg Intravenous Q2H PRN Renee A Kuneff, DO   50 mcg at 02/21/13 1700  . folic acid (FOLVITE) tablet 1 mg  1 mg Oral Daily Thurnell Lose, MD   1 mg at 03/01/13 1052  . food thickener (THICK IT) powder   Oral PRN Thurnell Lose, MD      . heparin injection 5,000 Units  5,000 Units Subcutaneous 3 times per day Imogene Burn. Tsuei, MD   5,000 Units at 03/01/13 1456  . [START ON 03/02/2013] hydrocortisone sodium succinate (SOLU-CORTEF) 100 MG injection 15 mg  15 mg Intravenous Daily Shanker Kristeen Mans, MD      . insulin aspart (novoLOG) injection 0-9 Units  0-9 Units Subcutaneous TID AC & HS Shanker M  Ghimire, MD      . iron polysaccharides (NIFEREX) capsule 150 mg  150 mg Oral Daily Melton Alar, PA-C   150 mg at 03/01/13 1053  . metoprolol (LOPRESSOR) injection 2.5-5 mg  2.5-5 mg Intravenous Q3H PRN Colbert Coyer, MD      . metroNIDAZOLE (FLAGYL) tablet 500 mg  500 mg Oral 3 times per day [name], MD   500 mg at 03/01/13 1456  . multivitamin with minerals tablet 1 tablet  1 tablet Oral Daily Erlene Quan, RD   1 tablet at 03/01/13 1052  . ondansetron (ZOFRAN) tablet 4 mg  4 mg Oral Q6H PRN Samuella Cota, MD       Or  . ondansetron [facility]) injection 4 mg  4 mg Intravenous Q6H PRN Samuella Cota, MD      . pantoprazole (PROTONIX) EC tablet 40 mg  40 mg Oral Q1200 Thurnell Lose, MD   40 mg at 03/01/13 1245  . sodium chloride 0.9 % bolus 1,000 mL  1,000 mL Intravenous Once Renee A Kuneff, DO      .  sodium chloride 0.9 % injection 10-40 mL  10-40 mL Intracatheter PRN Jonetta Osgood, MD   10 mL at 02/27/13 0523  . thiamine (VITAMIN B-1) tablet 100 mg  100 mg Oral Daily Thurnell Lose, MD   100 mg at 03/01/13 1053    Objective: Vital: Filed Vitals:   02/28/13 2119 03/01/13 0622 03/01/13 0906 03/01/13 1448  BP: 135/70 145/70  125/72  Pulse: 86 88  87  Temp: 98.3 F (36.8 C) 99 F (37.2 C)  98.9 F (37.2 C)  TempSrc: Oral   Oral  Resp: 18 20  18   Height:      Weight:  64.955 kg (143 lb 3.2 oz)    SpO2: 95% 96% 96% 98%    I/Os: I/O last 3 completed shifts: In: 2570 [P.O.:360; I.V.:2160; IV Piggyback:50] Out: 4700 [Urine:4700]  Physical Exam:  General: Patient is in no apparent distress Lungs: coarse breath sounds GI: The abdomen is soft a, nontender.  Midline wound opened/packed and starting to granulate.  He has onepenrose drains on the right side of his pelvis now without much drainage. Foley is draining clear yellow urine. Ext: lower extremities symmetric  Lab Results:  Recent Labs  02/28/13 0512  WBC 13.4*  HGB 10.8*  HCT 31.7*    Recent Labs  02/28/13 0512  NA 141  K 3.5*  CL 103  CO2 29  GLUCOSE 102*  BUN 12  CREATININE 0.85  CALCIUM 7.6*   No results found for this basename: LABPT, INR,  in the last 72 hours No results found for this basename: LABURIN,  in the last 72 hours Results for orders placed during the hospital encounter of 02/18/13  URINE CULTURE     Status: None   Collection Time    02/18/13  7:20 AM      Result Value Ref Range Status   Specimen Description URINE, CLEAN CATCH   Final   Special Requests NONE   Final   Culture  Setup Time     Final   Value: 02/18/2013 10:26     Performed at [place]     Final   Value: >=100,000 COLONIES/ML     Performed at [facility]   Culture     Final   Value: ESCHERICHIA COLI     Performed at [facility]   Report Status 02/22/2013 FINAL    Final   Organism ID, Bacteria ESCHERICHIA COLI   Final  CULTURE, BLOOD (ROUTINE X 2)     Status: None   Collection Time    02/18/13  8:15 AM      Result Value Ref Range Status   Specimen Description BLOOD SITE NOT SPECIFIED DRAWN BY RN   Final   Special Requests BOTTLES DRAWN AEROBIC AND ANAEROBIC 4CC   Final   Culture NO GROWTH 5 DAYS   Final   Report Status 02/23/2013 FINAL   Final  CULTURE, BLOOD (ROUTINE X 2)     Status: None   Collection Time    02/18/13  8:30 AM      Result Value Ref Range Status   Specimen Description BLOOD RIGHT ARM   Final   Special Requests BOTTLES DRAWN AEROBIC ONLY [facility]   Final   Culture  Setup Time     Final   Value: 02/20/2013 01:21     Performed at [facility]   Culture     Final   Value: ESCHERICHIA COLI     1822 Note: Gram Stain Report Called to,Read Back By and Verified With: Girard Cooter 02/19/2013 B[place]GHAM Performed at [facility]     Performed at [facility]   Report Status 02/22/2013 FINAL   Final   Organism ID, Bacteria ESCHERICHIA COLI   Final  MRSA PCR SCREENING     Status: None   Collection Time    02/18/13 10:13 AM      Result Value Ref Range Status   MRSA by PCR NEGATIVE  NEGATIVE Final   Comment:            The GeneXpert MRSA Assay (FDA     approved for NASAL specimens     only), is one component of a     comprehensive MRSA colonization     surveillance program. It is not     intended to diagnose MRSA     infection nor to guide or     monitor treatment for     MRSA infections.  AFB CULTURE WITH SMEAR     Status: None   Collection Time    02/19/13 12:28 AM      Result Value Ref Range Status   Specimen Description ABSCESS ABDOMEN   Final   Special Requests PRE PERITONEAL   Final   ACID FAST SMEAR     Final   Value: NO ACID FAST BACILLI SEEN     Performed at [facility]   Culture     Final   Value: CULTURE WILL BE EXAMINED FOR 6 WEEKS BEFORE ISSUING A FINAL REPORT     Performed at [facility]   Report Status PENDING   Incomplete  ANAEROBIC CULTURE     Status: None   Collection Time    02/19/13 12:30 AM      Result Value Ref Range Status   Specimen Description ABSCESS ABDOMEN   Final   Special Requests PRE PERITONEAL   Final   Gram Stain     Final   Value: ABUNDANT WBC PRESENT,BOTH PMN AND MONONUCLEAR     NO SQUAMOUS EPITHELIAL CELLS SEEN     MODERATE GRAM NEGATIVE RODS     Performed at [facility]   Culture     Final   Value: NO ANAEROBES ISOLATED     Performed at [facility]  Report Status 02/24/2013 FINAL   Final  CULTURE, ROUTINE-ABSCESS     Status: None   Collection Time    02/19/13 12:31 AM      Result Value Ref Range Status   Specimen Description ABSCESS ABDOMEN   Final   Special Requests PRE PERITONEAL   Final   Gram Stain     Final   Value: ABUNDANT WBC PRESENT,BOTH PMN AND MONONUCLEAR     NO SQUAMOUS EPITHELIAL CELLS SEEN     MODERATE GRAM NEGATIVE RODS     Performed at [facility]   Culture     Final   Value: MODERATE ESCHERICHIA COLI     Performed at [facility]   Report Status 02/21/2013 FINAL   Final   Organism ID, Bacteria ESCHERICHIA COLI   Final  FUNGUS CULTURE W SMEAR     Status: None   Collection Time    02/19/13 12:33 AM      Result Value Ref Range Status   Specimen Description ABSCESS ABDOMEN   Final   Special Requests PRE PERITONEAL   Final   Fungal Smear     Final   Value: NO YEAST OR FUNGAL ELEMENTS SEEN     Performed at [facility]   Culture     Final   Value: CULTURE IN PROGRESS FOR FOUR WEEKS     Performed at [facility]   Report Status PENDING   Incomplete  CULTURE, RESPIRATORY (NON-EXPECTORATED)     Status: None   Collection Time    02/19/13  5:38 AM      Result Value Ref Range Status   Specimen Description TRACHEAL ASPIRATE   Final   Special Requests NONE   Final   Gram Stain     Final   Value: NO WBC SEEN     NO SQUAMOUS EPITHELIAL CELLS SEEN     NO  ORGANISMS SEEN     Performed at [facility]   Culture     Final   Value: Non-Pathogenic Oropharyngeal-type Flora Isolated.     Performed at [facility]   Report Status 02/21/2013 FINAL   Final  CULTURE, RESPIRATORY (NON-EXPECTORATED)     Status: None   Collection Time    02/19/13  9:53 AM      Result Value Ref Range Status   Specimen Description TRACHEAL ASPIRATE   Final   Special Requests NONE   Final   Gram Stain     Final   Value: FEW WBC PRESENT, PREDOMINANTLY PMN     RARE SQUAMOUS EPITHELIAL CELLS PRESENT     RARE GRAM POSITIVE COCCI     IN PAIRS     Performed at [facility]   Culture     Final   Value: Non-Pathogenic Oropharyngeal-type Flora Isolated.     Performed at [facility]   Report Status 02/21/2013 FINAL   Final  CULTURE, BLOOD (ROUTINE X 2)     Status: None   Collection Time    02/24/13  3:00 PM      Result Value Ref Range Status   Specimen Description BLOOD LEFT ARM   Final   Special Requests BOTTLES DRAWN AEROBIC ONLY [facility]   Final   Culture  Setup Time     Final   Value: 02/24/2013 22:10     Performed at [facility]   Culture     Final   Value:  BLOOD CULTURE RECEIVED NO GROWTH TO DATE CULTURE WILL BE HELD FOR 5 DAYS BEFORE ISSUING A FINAL NEGATIVE REPORT     Performed at [facility]   Report Status PENDING   Incomplete  CULTURE, BLOOD (ROUTINE X 2)     Status: None   Collection Time    02/24/13  3:10 PM      Result Value Ref Range Status   Specimen Description BLOOD LEFT HAND   Final   Special Requests BOTTLES DRAWN AEROBIC ONLY [facility]   Final   Culture  Setup Time     Final   Value: 02/24/2013 22:11     Performed at [facility]   Culture     Final   Value:        BLOOD CULTURE RECEIVED NO GROWTH TO DATE CULTURE WILL BE HELD FOR 5 DAYS BEFORE ISSUING A FINAL NEGATIVE REPORT     Performed at [facility]   Report Status PENDING   Incomplete    Assessment: 11 Days Post-Op from  pelvic exploration/washout, improving and making great progress.  E.coli grown from pelvic fluid cultures, blood and urine, resistant to Cipro.  Elevated WBC likely from both PNA and pelvic infection.   Plan:   Per Dr. Louis Meckel: Plan to leave the foley catheter in for at least 2 weeks from time of exploration.    Right penrose to be backed out by Dr. Louis Meckel tomorrow.  Continue local wound care.    Molli Hazard 03/01/2013, 5:08 PM

## 2013-03-01 NOTE — Progress Notes (Signed)
PATIENT DETAILS Name: Jason [name] Age: 68 y.o. Sex: male Date of Birth: 28-Aug-1945 Admit Date: 02/18/2013 Admitting Physician Juanito Doom, MD [id number] Hart Carwin, MD  Subjective:  Patient has no complaints-lying comfortably.  Assessment/Plan: Severe Sepsis with possible necrotizing fasciitis of the rectus muscle and left internal obturator  currently hemodynamically stable - sepsis resolved.  Status post exploratory laparotomy and drainage by general surgery and urology on 02/18/2013,  status post intubation and extubation was extubated on 02/20/2013.  JP drain was removed on 2/24 by general surgery  Penrose drains are being managed by Urology. Per Dr. Louis Meckel these drains will be very slowly backed out in order to prevent another abscess from forming. This will most likely take until Monday.  Patient receiving Wet to Dry dressing changes.  Leave foley catheter in place for at least 2 weeks post op per Urology.  Taper solucortef to 15 mg daily  E-coli Bacteremia  1 of 2 blood cultures drawn 2/18 positive for e-coli.  Antibiotics are being managed by infectious disease and have been narrowed to Unasyn as of February 23.  Per ID he will need several more weeks of IV antibiotic therapy. Currently on IV Rocephin and Flagyl-currently on day 12 of  Antibioitcs Repeat blood cultures ordered 2/24. These show NGTD.  PICC placed on 2/26.  HCAP / Aspiration Pneumonia  Resolved. Was initially treated with Vancomycin.  This has since been narrowed to Unasyn by Infectious Disease.  Bronch evidence of HCAP with report suggesting possible aspiration.  Speech evaluation completed. Regular diet with thin liquids recommended.  Pulmonary toiletry, chest PT, Oxygen, nebulizer treatments as needed.  Hiccups  Started 2/24  Resolved 2/26 on Thorazine. Changed Thorazine to QID PRN Hiccups.  Acute renal failure.  Due to severe sepsis.  Resolved with IV fluids  Hypokalemia.    Resolved after supplementation.  Will continue to monitor intermittently.  Dementia.  Will remain at risk for delirium.  Supportive care, avoid benzos.  History of alcohol abuse.  Counseled to quit alcohol.  Folate, thiamine and multivitamins. No signs of DTs.  History of Hematuria and Urothelial carcinoma  follows with urology at [facility], post discharge he will follow with them.   Sacral decub  10 cm x 10 cm in size. Recommendations left by [place] on 2/19.  Air Flow Mattress ordered for weight distribution.  WOC added enzymatic debridement ointment and PT for hydrotherapy to clear the wound bed. When transfer to SNF, [place] suggests daily Santyl, cover with moist gauze, and foam. Change daily and follow up in a wound care center.  Disposition: Remain inpatient-SNF 1-2 days  DVT Prophylaxis Prophylactic heparin  Code Status: Full code   Family Communication None at bedside  Procedures: 2/18 US Renal: No hydronephrosis.  2/18 Exploratory laparotomy with drainage of retroperitoneal and preperitoneal soft tissue infection  2/19 Intubated.  2/19 Bronchoscopy,Sent purulent fluid  2/20 extubated  2/26 PICC placed   CONSULTS:  ID, nephrology, general surgery and urology   MEDICATIONS: Scheduled Meds: . albuterol  2.5 mg Nebulization BID  . antiseptic oral rinse  15 mL Mouth Rinse QID  . cefTRIAXone (ROCEPHIN)  IV  1 g Intravenous Q24H  . chlorhexidine  15 mL Mouth Rinse BID  . collagenase   Topical Daily  . feeding supplement (GLUCERNA SHAKE)  237 mL Oral Q24H  . folic acid  1 mg Oral Daily  . heparin subcutaneous  5,000 Units Subcutaneous 3 times per day  . hydrocortisone sod  succinate (SOLU-CORTEF) inj  20 mg Intravenous Daily  . insulin aspart  0-9 Units Subcutaneous 6 times per day  . iron polysaccharides  150 mg Oral Daily  . metroNIDAZOLE  500 mg Oral 3 times per day  . multivitamin with minerals  1 tablet Oral Daily  . pantoprazole  40 mg Oral Q1200  .  sodium chloride  1,000 mL Intravenous Once  . sodium chloride  3 mL Intravenous Q12H  . thiamine  100 mg Oral Daily   Continuous Infusions: . sodium chloride 10 mL/hr (02/26/13 1303)  . dextrose 60 mL/hr at 03/01/13 0230  . fentaNYL infusion INTRAVENOUS Stopped (02/20/13 0746)  . norepinephrine (LEVOPHED) Adult infusion    . phenylephrine (NEO-SYNEPHRINE) Adult infusion     PRN Meds:.acetaminophen, acetaminophen, chlorproMAZINE, fentaNYL, fentaNYL, food thickener, metoprolol, ondansetron (ZOFRAN) IV, ondansetron, sodium chloride  Antibiotics: Anti-infectives   Start     Dose/Rate Route Frequency Ordered Stop   02/27/13 2200  metroNIDAZOLE (FLAGYL) tablet 500 mg     500 mg Oral 3 times per day 02/27/13 1504     02/27/13 1600  cefTRIAXone (ROCEPHIN) 1 g in dextrose 5 % 50 mL IVPB     1 g 100 mL/hr over 30 Minutes Intravenous Every 24 hours 02/27/13 1504     02/23/13 1200  Ampicillin-Sulbactam (UNASYN) 3 g in sodium chloride 0.9 % 100 mL IVPB  Status:  Discontinued     3 g 100 mL/hr over 60 Minutes Intravenous Every 6 hours 02/23/13 1109 02/27/13 1504   02/22/13 1600  piperacillin-tazobactam (ZOSYN) IVPB 3.375 g  Status:  Discontinued     3.375 g 12.5 mL/hr over 240 Minutes Intravenous Every 8 hours 02/22/13 1516 02/23/13 1109   02/22/13 1200  vancomycin (VANCOCIN) IVPB 1000 mg/200 mL premix  Status:  Discontinued     1,000 mg 200 mL/hr over 60 Minutes Intravenous Every 12 hours 02/22/13 1111 02/23/13 1109   02/19/13 2300  vancomycin (VANCOCIN) IVPB 750 mg/150 ml premix  Status:  Discontinued     750 mg 150 mL/hr over 60 Minutes Intravenous Every 12 hours 02/19/13 1436 02/20/13 0943   02/19/13 1500  meropenem (MERREM) 1 g in sodium chloride 0.9 % 100 mL IVPB  Status:  Discontinued     1 g 200 mL/hr over 30 Minutes Intravenous 3 times per day 02/19/13 1436 02/22/13 1455   02/19/13 1000  vancomycin (VANCOCIN) IVPB 750 mg/150 ml premix  Status:  Discontinued     750 mg 150 mL/hr over  60 Minutes Intravenous Every 24 hours 02/18/13 1031 02/19/13 1437   02/18/13 1800  clindamycin (CLEOCIN) IVPB 900 mg  Status:  Discontinued     900 mg 100 mL/hr over 30 Minutes Intravenous 4 times per day 02/18/13 1626 02/20/13 0943   02/18/13 1800  meropenem (MERREM) 500 mg in sodium chloride 0.9 % 50 mL IVPB  Status:  Discontinued     500 mg 100 mL/hr over 30 Minutes Intravenous Every 12 hours 02/18/13 1649 02/19/13 1436   02/18/13 1200  piperacillin-tazobactam (ZOSYN) IVPB 2.25 g  Status:  Discontinued     2.25 g 100 mL/hr over 30 Minutes Intravenous 4 times per day 02/18/13 1026 02/18/13 1626   02/18/13 1100  vancomycin (VANCOCIN) 1,500 mg in sodium chloride 0.9 % 500 mL IVPB     1,500 mg 250 mL/hr over 120 Minutes Intravenous  Once 02/18/13 1026 02/18/13 1300   02/18/13 0800  cefTRIAXone (ROCEPHIN) 1 g in dextrose 5 % 50 mL IVPB  1 g Intravenous  Once 02/18/13 0752 02/18/13 0848       PHYSICAL EXAM: Vital signs in last 24 hours: Filed Vitals:   02/28/13 2047 02/28/13 2119 03/01/13 0622 03/01/13 0906  BP:  135/70 145/70   Pulse: 77 86 88   Temp:  98.3 F (36.8 C) 99 F (37.2 C)   TempSrc:  Oral    Resp: 18 18 20    Height:      Weight:   64.955 kg (143 lb 3.2 oz)   SpO2: 95% 95% 96% 96%    Weight change: -6.713 kg (-14 lb 12.8 oz) Filed Weights   02/27/13 0427 02/28/13 0500 03/01/13 0622  Weight: 69.9 kg (154 lb 1.6 oz) 71.668 kg (158 lb) 64.955 kg (143 lb 3.2 oz)   Body mass index is 19.42 kg/(m^2).   Gen Exam: Awake and alert with clear speech.   Neck: Supple, No JVD.   Chest: B/L Clear.   CVS: S1 S2 Regular, no murmurs. Abdomen: soft, BS +, non tender, non distended.  Extremities: no edema, lower extremities warm to touch. Dressing in place. Neurologic: Non Focal.  Skin: No Rash.     Intake/Output from previous day:  Intake/Output Summary (Last 24 hours) at 03/01/13 1041 Last data filed at 03/01/13 0951  Gross per 24 hour  Intake   1910 ml  Output    3800 ml  Net  -1890 ml     LAB RESULTS: CBC  Recent Labs Lab 02/23/13 0640 02/24/13 0542 02/25/13 0520 02/26/13 0438 02/28/13 0512  WBC 27.6* 21.9* 18.9* 16.5* 13.4*  HGB 11.7* 12.3* 10.4* 10.7* 10.8*  HCT 33.5* 36.3* 30.9* 31.7* 31.7*  PLT 253 289 285 308 299  MCV 68.9* 70.2* 69.8* 69.5* 70.0*  MCH 24.1* 23.8* 23.5* 23.5* 23.8*  MCHC 34.9 33.9 33.7 33.8 34.1  RDW 13.0 13.4 13.0 13.3 13.8    Chemistries   Recent Labs Lab 02/23/13 0640 02/24/13 0542 02/25/13 0520 02/26/13 0438 02/28/13 0512  NA 143 141 139 140 141  K 3.9 3.3* 3.5* 3.4* 3.5*  CL 105 101 101 102 103  CO2 29 32 28 30 29   GLUCOSE 127* 92 115* 126* 102*  BUN 22 16 16 12 12   CREATININE 0.84 0.89 0.71 0.76 0.85  CALCIUM 7.7* 7.8* 7.4* 7.4* 7.6*    CBG:  Recent Labs Lab 02/28/13 1715 02/28/13 1934 03/01/13 0010 03/01/13 0404 03/01/13 0750  GLUCAP 149* 128* 98 100* 86    GFR Estimated Creatinine Clearance: 75.4 ml/min (by C-G formula based on Cr of 0.85).  Coagulation profile No results found for this basename: INR, PROTIME,  in the last 168 hours  Cardiac Enzymes No results found for this basename: CK, CKMB, TROPONINI, MYOGLOBIN,  in the last 168 hours  No components found with this basename: POCBNP,  No results found for this basename: DDIMER,  in the last 72 hours No results found for this basename: HGBA1C,  in the last 72 hours No results found for this basename: CHOL, HDL, LDLCALC, TRIG, CHOLHDL, LDLDIRECT,  in the last 72 hours No results found for this basename: TSH, T4TOTAL, FREET3, T3FREE, THYROIDAB,  in the last 72 hours No results found for this basename: VITAMINB12, FOLATE, FERRITIN, TIBC, IRON, RETICCTPCT,  in the last 72 hours No results found for this basename: LIPASE, AMYLASE,  in the last 72 hours  Urine Studies No results found for this basename: UACOL, UAPR, USPG, UPH, UTP, UGL, UKET, UBIL, UHGB, UNIT, UROB, ULEU, UEPI, UWBC, URBC, UBAC,  CAST, CRYS, UCOM, BILUA,  in  the last 72 hours  MICROBIOLOGY: Recent Results (from the past 240 hour(s))  CULTURE, BLOOD (ROUTINE X 2)     Status: None   Collection Time    02/24/13  3:00 PM      Result Value Ref Range Status   Specimen Description BLOOD LEFT ARM   Final   Special Requests BOTTLES DRAWN AEROBIC ONLY 6CC   Final   Culture  Setup Time     Final   Value: 02/24/2013 22:10     Performed at [facility]   Culture     Final   Value:        BLOOD CULTURE RECEIVED NO GROWTH TO DATE CULTURE WILL BE HELD FOR 5 DAYS BEFORE ISSUING A FINAL NEGATIVE REPORT     Performed at [facility]   Report Status PENDING   Incomplete  CULTURE, BLOOD (ROUTINE X 2)     Status: None   Collection Time    02/24/13  3:10 PM      Result Value Ref Range Status   Specimen Description BLOOD LEFT HAND   Final   Special Requests BOTTLES DRAWN AEROBIC ONLY [facility]   Final   Culture  Setup Time     Final   Value: 02/24/2013 22:11     Performed at [facility]   Culture     Final   Value:        BLOOD CULTURE RECEIVED NO GROWTH TO DATE CULTURE WILL BE HELD FOR 5 DAYS BEFORE ISSUING A FINAL NEGATIVE REPORT     Performed at [facility]   Report Status PENDING   Incomplete    RADIOLOGY STUDIES/RESULTS: Ct Abdomen Pelvis Wo Contrast  02/18/2013   CLINICAL DATA:  Fever. Recent transurethral resection of bladder tumor. Extraluminal gas collection in the left side of the pelvis.  EXAM: CT ABDOMEN AND PELVIS WITHOUT CONTRAST  TECHNIQUE: Multidetector CT imaging of the abdomen and pelvis was performed following the standard protocol without intravenous contrast.  COMPARISON:  Radiograph dated 02/18/2013 and CT scan dated 08/30/2006  FINDINGS: There are bilateral extensive lower lobe consolidative pulmonary infiltrates. No effusions. Heart size is normal. Tiny hiatal hernia.  There is extensive extraluminal air in the soft tissues of the pelvis including the presacral space and along the left side of the pelvis  along the fascia of the left antral operator muscle as well as extending anteriorly along the deep fascia of the rectus muscles. This is consistent with and anaerobic infection and possible necrotizing fasciitis.  The liver, biliary tree, spleen, pancreas, adrenal glands, and kidneys demonstrate no significant abnormality. The bowel appears normal. Foley catheter is in place in the bladder with a small amount of air in the bladder.  IMPRESSION: 1. Evidence consistent with anaerobic soft tissue infection in the pelvis with extension to the fascia of the rectus muscles and of the left internal obturator muscle consistent with necrotizing fasciitis. No visible pus collections in the pelvis. 2. Extensive bilateral lower lobe pneumonia.  Critical Value/emergent results were called by telephone at the time of interpretation on 02/18/2013 at 3:55 PM to Dr. Murray Hodgkins , who verbally acknowledged these results.   Electronically Signed   By: Rozetta Nunnery M.D.   On: 02/18/2013 15:56   Dg Chest 2 View  02/23/2013   CLINICAL DATA:  68 year old male with cough, congestion and pneumonia.  EXAM: CHEST  2 VIEW  COMPARISON:  02/22/2013  FINDINGS: Cardiomediastinal  silhouette is stable.  Bilateral lower lung atelectasis/ airspace disease/ consolidation noted with probable bilateral pleural effusions.  There is no evidence of pneumothorax, pulmonary edema or acute bony abnormality.  IMPRESSION: Unchanged bilateral lower lung atelectasis/ airspace disease/ consolidation with probable bilateral pleural effusions.   Electronically Signed   By: Hassan Rowan M.D.   On: 02/23/2013 01:45   US Renal  02/18/2013   CLINICAL DATA:  Acute renal failure  EXAM: RENAL/URINARY TRACT ULTRASOUND COMPLETE  COMPARISON:  01/13/2013  FINDINGS: Right Kidney:  Length: 11.2 cm.  No mass or hydronephrosis.  Left Kidney:  Length: 11.0 cm.  No mass or hydronephrosis.  Bladder:  Poorly visualized/decompressed by indwelling Foley catheter.  IMPRESSION: No  hydronephrosis.  Bladder poorly visualized/decompressed by indwelling Foley catheter.   Electronically Signed   By: Julian Hy M.D.   On: 02/18/2013 13:36   Dg Chest Port 1 View  02/22/2013   CLINICAL DATA:  Basilar airspace disease and effusions  EXAM: PORTABLE CHEST - 1 VIEW  COMPARISON:  02/21/2013  FINDINGS: Normal heart size and vascularity. Persistent bibasilar atelectasis/consolidation noted with pleural effusions. Given changes in technique, no significant interval change. No pneumothorax. Trachea is midline.  IMPRESSION: Persistent bibasilar atelectasis/consolidation with associated pleural effusions.   Electronically Signed   By: Daryll Brod M.D.   On: 02/22/2013 08:05   Dg Chest Port 1 View  02/21/2013   CLINICAL DATA:  Shortness of breath.  Marland Kitchen  EXAM: PORTABLE CHEST - 1 VIEW  COMPARISON:  DG CHEST 1V PORT dated 02/21/2013  FINDINGS: Mediastinum and hilar structures are normal. Progressive atelectasis right lower lobe. Developing infiltrate left lower lobe. Bilateral pleural effusions. No pneumothorax. Heart size normal. No acute osseous abnormality.  IMPRESSION: 1.  Progressive atelectasis and/or infiltrate right lower lobe.  2.  Developing infiltrate left lower lobe.  3.  Bilateral small pleural effusions.   Electronically Signed   By: Marcello Moores  Register   On: 02/21/2013 23:39   Dg Chest Port 1 View  02/21/2013   CLINICAL DATA:  Pneumonia, extubated  EXAM: PORTABLE CHEST - 1 VIEW  COMPARISON:  02/20/2013  FINDINGS: Interval extubation. Right IJ central line and NG tube have also been removed. No pneumothorax. Slight worsening basilar airspace disease and left lower lobe consolidation/ collapse. Small effusions not excluded. Stable upper lobe aeration.  IMPRESSION: Interval extubation.  Slight worsening basilar airspace process and left lower lobe collapse/consolidation, compatible with pneumonia.   Electronically Signed   By: Daryll Brod M.D.   On: 02/21/2013 07:42   Dg Chest Port 1  View  02/20/2013   CLINICAL DATA:  68 year old male intubated. Sepsis. Initial encounter.  EXAM: PORTABLE CHEST - 1 VIEW  COMPARISON:  02/1913 and earlier.  FINDINGS: Portable AP semi upright view at 0527 hr. Endotracheal tube tip projects below the clavicles now all, about 3 cm above the carina. Enteric tube courses to the left upper quadrant, tip not included. Stable right IJ central line.  Improved bibasilar ventilation with better definition of the diaphragm. Residual patchy and reticular nodular medial lung base density greater on the right. No pneumothorax. No edema. No definite effusion. Stable cardiac size and mediastinal contours.  IMPRESSION: 1. Lines and tubes appear appropriately placed as above. 2. Improved ventilation. Residual medial lung base opacity greater on the right.   Electronically Signed   By: Lars Pinks M.D.   On: 02/20/2013 07:26   Dg Chest Port 1 View  02/19/2013   CLINICAL DATA:  Repositioning of endotracheal  tube.  EXAM: PORTABLE CHEST - 1 VIEW  COMPARISON:  DG CHEST 1V PORT dated 02/19/2013  FINDINGS: The endotracheal tube tip now lies 5.7 cm above the crotch of the carina. The lungs are well-expanded. Increased density at the right lung base persists and is consistent with atelectasis or pneumonia. The cardiac silhouette is not enlarged. The pulmonary vascularity is not engorged. The esophagogastric tube tip and proximal port project below the inferior margin of the image. The right internal jugular venous catheter tip lies in the region of the midportion of the SVC.  IMPRESSION: There has been adequate interval repositioning of the endotracheal tube. The remainder of the examination is unchanged.   Electronically Signed   By: David  Martinique   On: 02/19/2013 10:26   Dg Chest Port 1 View  02/19/2013   CLINICAL DATA:  Post bronchoscopy  EXAM: PORTABLE CHEST - 1 VIEW  COMPARISON:  Portable exam 0952 hr compared to the 0026 hr  FINDINGS: Tip of endotracheal tube 11 cm above carina.   Right jugular central venous catheter tip projects over SVC.  Normal heart size and mediastinal contours.  Bibasilar opacities question infiltrate versus atelectasis greater on right.  Upper lobes clear.  No pleural effusion or pneumothorax.  IMPRESSION: Bibasilar opacities greater on right question atelectasis versus infiltrate.  High endotracheal tube position 11 cm above carina, consider advancing tube 5 cm for more stable positioning within the thoracic trachea.  Findings called to [facility] on [address] [place] on 02/19/2013 at 1004 hr.   Electronically Signed   By: Lavonia Dana M.D.   On: 02/19/2013 10:04   Dg Chest Port 1 View  02/19/2013   CLINICAL DATA:  Endotracheal tube placement  EXAM: PORTABLE CHEST - 1 VIEW  COMPARISON:  DG CHEST 1V PORT dated 02/18/2013  FINDINGS: Endotracheal tube is positioned 6 cm from the carina. NG to extends into the stomach. Side port is just above the GE junction. Right central venous line placement of the distal SVC.  There is new right lower lobe atelectasis.  Upper lungs are clear.  IMPRESSION: 1. Endotracheal tube appears in good position. 2. New right lower lobe atelectasis. 3. Interval placement NG tube with side port is just above GE junction.   Electronically Signed   By: Suzy Bouchard M.D.   On: 02/19/2013 00:46   Dg Chest Portable 1 View  02/18/2013   CLINICAL DATA:  Evaluate right central venous line insertion  EXAM: PORTABLE CHEST - 1 VIEW  COMPARISON:  DG CHEST 1V PORT dated 02/18/2013  FINDINGS: There is a right central venous line with tip in the distal SVC. No pneumothorax. Normal cardiac silhouette. There is bibasilar airspace disease greater on the right which is not to be changed from prior. No upper lobe airspace disease.  IMPRESSION: 1. Interval placement right central venous line without complication. 2. Bibasilar asymmetric airspace disease is not changed.   Electronically Signed   By: Suzy Bouchard M.D.   On: 02/18/2013 21:19   Dg Chest Portable 1  View  02/18/2013   CLINICAL DATA:  Hypoxia, COPD  EXAM: PORTABLE CHEST - 1 VIEW  COMPARISON:  01/27/2013  FINDINGS: Cardiac shadow is stable. Bilateral basilar infiltrates are now seen new from the prior exam. The bony structures are within normal limits.  IMPRESSION: Bibasilar infiltrates.   Electronically Signed   By: Inez Catalina M.D.   On: 02/18/2013 08:28   Dg Abd 2 Views  02/22/2013   CLINICAL DATA:  Abdominal pain  EXAM: ABDOMEN - 2 VIEW  COMPARISON:  CT ABD/PELV WO CM dated 02/18/2013  FINDINGS: There is no evidence of free intraperitoneal gas. Mild distention of small and large bowel loops in and ileus pattern are noted. Surgical drain projects over the pelvis. Multiple Penrose drains project over the pelvis.  IMPRESSION: No free intraperitoneal gas.  Ileus.  Drains project over the pelvis.   Electronically Signed   By: Maryclare Bean M.D.   On: 02/22/2013 13:11   Dg Abd 2 Views  02/18/2013   CLINICAL DATA:  Bladder tumor, urothelial carcinoma, possible vomiting, post TURBT 02/13/2013  EXAM: ABDOMEN - 2 VIEW  COMPARISON:  None  FINDINGS: Bibasilar infiltrates question pneumonia versus aspiration.  Air-filled nondistended small bowel loops the mid abdomen, question postoperative ileus.  Bubbly gas identified in the pelvis, could be related to stool within the distal colon but extending farther left lateral than is typically seen with rectal vault stool, raising question of extraperitoneal gas in the left pelvis.  Bones unremarkable.  No urinary tract calcification.  No definite free intraperitoneal air.  IMPRESSION: Bibasilar opacities question pneumonia versus aspiration.  Potential postoperative ileus.  Bubbly gas extending farther left laterally in the inferior pelvis than is typically seen with stool, question prominent stool versus extraperitoneal extraluminal gas in patient who has had a recent cystoscopy and TURBT ; CT imaging of the abdomen and pelvis with IV and oral contrast recommended.  Findings  called to Dr. Sarajane Jews on 02/18/2013 at 1315 hr.   Electronically Signed   By: Lavonia Dana M.D.   On: 02/18/2013 13:16   Dg Swallowing Func-speech Pathology  02/23/2013   Jason [name], CCC-SLP     02/23/2013 10:49 AM Objective Swallowing Evaluation: Modified Barium Swallowing Study   Patient Details  Name: ARISTIDIS ADLER MRN: [id number] Date of Birth: 1945-02-26  Today's Date: 02/23/2013 Time: [id number] SLP Time Calculation (min): 20 min  Past Medical History:  Past Medical History  Diagnosis Date  . Hypertension   . Stroke     Dementia, otherwise, no residual  . Bladder mass 02-10-13    surgery planned for this  . 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 COPD (chronic obstructive pulmonary disease)     previous history and current smoking  . 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 Dementia 02/18/2013   Past Surgical History:  Past Surgical History  Procedure Laterality Date  . Transurethral resection of bladder tumor with gyrus  (turbt-gyrus) N/A 02/13/2013    Procedure: TRANSURETHRAL RESECTION OF BLADDER TUMOR WITH GYRUS  (TURBT-GYRUS), bladder biopsies;  Surgeon: Ardis Hughs,  MD;  Location: [facility];  Service: Urology;  Laterality: N/A;  . Cystoscopy/retrograde/ureteroscopy Bilateral 02/13/2013    Procedure: BILATERAL RETROGRADE;  Surgeon: Ardis Hughs,  MD;  Location: [facility];  Service: Urology;  Laterality: Bilateral;   . Laparotomy N/A 02/18/2013    Procedure: EXPLORATORY LAPAROTOMY drainage of retroperitoneal  abscess, drainage of Pre-Peritoneal abscess and Exploration of  bladder perforation;  Surgeon: Imogene Burn. Tsuei, MD;  Location:  [place];  Service: General;  Laterality: N/A;   HPI:  Severe Sepsis with possible necrotizing fasciitis of the rectus  muscle and left internal obturator , also suspected HCAP -  currently hemodynamically stable, status post exploratory  laparotomy and drainage by general surgery and urology on  02/18/2013, status post intubation and extubation  was extubated  on 02/20/2013.      Assessment / Plan / Recommendation Clinical Impression  Clinical  impression: Pt demonstrates moderate sensory impairment  impacting safety with large straw sips or with pills given with  liquids. Pts oral phase is characterized by slow pumping and  prolonged mastication of solids, poor posterior containment with  straw sips and struggle to transit pill. Pharyngeal phase is  strong, but there is decreased coordination and timing with straw  sips allowing for silent aspiration during/after the swallow. The  pt is recommended to consume regular solids and cup sips (may be  moderate to large without severe risk), but should not be given  straws and pills must be given whole in puree as there is  significant aspiration of liquids with pills. Fully upright  posture also very important. Recommend OOB if possible. SLP will  follow for tolerance.     Treatment Recommendation  Therapy as outlined in treatment plan below    Diet Recommendation Regular;Thin liquid   Liquid Administration via: No straw;Cup Medication Administration: Whole meds with puree Supervision: Staff to assist with self feeding;Full  supervision/cueing for compensatory strategies Compensations: Slow rate Postural Changes and/or Swallow Maneuvers: Seated upright 90  degrees;Out of bed for meals    Other  Recommendations Oral Care Recommendations: Oral care BID Other Recommendations: Have oral suction available   Follow Up Recommendations  Skilled Nursing facility    Frequency and Duration min 2x/week  2 weeks   Pertinent Vitals/Pain NA    SLP Swallow Goals     General HPI: Severe Sepsis with possible necrotizing fasciitis of  the rectus muscle and left internal obturator , also suspected  HCAP - currently hemodynamically stable, status post exploratory  laparotomy and drainage by general surgery and urology on  02/18/2013, status post intubation and extubation was extubated  on 02/20/2013.  Type of Study: Modified Barium  Swallowing Study Reason for Referral: Objectively evaluate swallowing function Diet Prior to this Study: Dysphagia 3 (soft);Nectar-thick liquids Temperature Spikes Noted: No Respiratory Status: Nasal cannula History of Recent Intubation: Yes Length of Intubations (days): 2 days Date extubated: 02/20/13 Behavior/Cognition: Alert;Cooperative;Confused;Decreased  sustained attention Oral Cavity - Dentition: Poor condition;Missing dentition Oral Motor / Sensory Function: Within functional limits Self-Feeding Abilities: Able to feed self Patient Positioning: Upright in chair Baseline Vocal Quality: Hoarse Volitional Cough: Other (Comment);Weak (requires max cues) Volitional Swallow:  (NT) Anatomy: Within functional limits Pharyngeal Secretions:  (probable standing secretions based on  vocal quality)    Reason for Referral Objectively evaluate swallowing function   Oral Phase Oral Preparation/Oral Phase Oral Phase: Impaired Oral - Thin Oral - Thin Cup: Lingual/palatal residue Oral - Thin Straw: Lingual/palatal residue;Piecemeal swallowing Oral - Solids Oral - Puree: Lingual/palatal residue;Delayed oral  transit;Reduced posterior propulsion;Lingual pumping Oral - Regular: Reduced posterior propulsion;Delayed oral transit Oral - Pill: Other (Comment) (dramatic posterior head tilt needed  to transit pills)   Pharyngeal Phase Pharyngeal Phase Pharyngeal Phase: Impaired Pharyngeal - Thin Pharyngeal - Thin Cup: Premature spillage to pyriform  sinuses;Pharyngeal residue - valleculae;Pharyngeal residue -  pyriform sinuses (independent second swallow to clear) Pharyngeal - Thin Straw: Premature spillage to pyriform  sinuses;Penetration/Aspiration after swallow;Moderate aspiration Penetration/Aspiration details (thin straw): Material enters  airway, passes BELOW cords without attempt by patient to eject  out (silent aspiration);Material does not enter airway Pharyngeal - Solids Pharyngeal - Puree: Delayed swallow  initiation;Premature spillage  to valleculae Pharyngeal - Regular: Delayed swallow initiation;Premature  spillage to valleculae Pharyngeal - Pill: Delayed swallow  initiation;Penetration/Aspiration before swallow;Moderate  aspiration Penetration/Aspiration details (pill): Material enters airway,  passes BELOW cords without attempt by  patient to eject out  (silent aspiration)  Cervical Esophageal Phase    GO    Cervical Esophageal Phase Cervical Esophageal Phase: [facility]         [name], Jason Ponto 02/23/2013, 10:48 AM     Oren Binet, MD  [facility] Pager:[phone]  If 7PM-7AM, please contact night-coverage [email or web address] Password [email or web address] 03/01/2013, 10:41 AM   LOS: 11 days

## 2013-03-02 DIAGNOSIS — A498 Other bacterial infections of unspecified site: Secondary | ICD-10-CM

## 2013-03-02 LAB — CULTURE, BLOOD (ROUTINE X 2)
CULTURE: NO GROWTH
Culture: NO GROWTH

## 2013-03-02 LAB — GLUCOSE, CAPILLARY
GLUCOSE-CAPILLARY: 148 mg/dL — AB (ref 70–99)
Glucose-Capillary: 86 mg/dL (ref 70–99)
Glucose-Capillary: 96 mg/dL (ref 70–99)

## 2013-03-02 MED ORDER — DEXTROSE 5 % IV SOLN: 1.0000 g | INTRAVENOUS | Status: DC

## 2013-03-02 MED ORDER — PANTOPRAZOLE SODIUM 40 MG PO TBEC: 40.0000 mg | DELAYED_RELEASE_TABLET | Freq: Every day | ORAL | Status: DC

## 2013-03-02 MED ORDER — HEPARIN SOD (PORK) LOCK FLUSH 100 UNIT/ML IV SOLN
250.0000 [IU] | Freq: Every day | INTRAVENOUS | Status: DC
  Administered 2013-03-02: 250 [IU]

## 2013-03-02 MED ORDER — PREDNISONE 10 MG PO TABS: ORAL_TABLET | ORAL | Status: DC

## 2013-03-02 MED ORDER — GLUCERNA SHAKE PO LIQD: 237.0000 mL | ORAL | Status: DC

## 2013-03-02 MED ORDER — ONDANSETRON HCL 4 MG PO TABS: 4.0000 mg | ORAL_TABLET | Freq: Four times a day (QID) | ORAL | Status: DC | PRN

## 2013-03-02 MED ORDER — ACETAMINOPHEN 325 MG PO TABS: 650.0000 mg | ORAL_TABLET | Freq: Four times a day (QID) | ORAL | Status: DC | PRN

## 2013-03-02 MED ORDER — COLLAGENASE 250 UNIT/GM EX OINT: TOPICAL_OINTMENT | Freq: Every day | CUTANEOUS | Status: DC

## 2013-03-02 MED ORDER — FOLIC ACID 1 MG PO TABS: 1.0000 mg | ORAL_TABLET | Freq: Every day | ORAL | Status: DC

## 2013-03-02 MED ORDER — POLYSACCHARIDE IRON COMPLEX 150 MG PO CAPS
150.0000 mg | ORAL_CAPSULE | Freq: Every day | ORAL | Status: DC
Start: 2013-03-02 — End: 2018-01-31

## 2013-03-02 MED ORDER — HEPARIN SOD (PORK) LOCK FLUSH 100 UNIT/ML IV SOLN: 250.0000 [IU] | INTRAVENOUS | Status: DC | PRN

## 2013-03-02 MED ORDER — OXYCODONE HCL 5 MG PO TABS: 5.0000 mg | ORAL_TABLET | ORAL | Status: DC | PRN

## 2013-03-02 MED ORDER — ADULT MULTIVITAMIN W/MINERALS CH: 1.0000 | ORAL_TABLET | Freq: Every day | ORAL | Status: DC

## 2013-03-02 MED ORDER — METRONIDAZOLE 500 MG PO TABS: 500.0000 mg | ORAL_TABLET | Freq: Three times a day (TID) | ORAL | Status: DC

## 2013-03-02 MED ORDER — THIAMINE HCL 100 MG PO TABS: 100.0000 mg | ORAL_TABLET | Freq: Every day | ORAL | Status: DC

## 2013-03-02 MED ORDER — INSULIN ASPART 100 UNIT/ML ~~LOC~~ SOLN: SUBCUTANEOUS | Status: DC

## 2013-03-02 NOTE — Discharge Instructions (Signed)
·   Activity:  You are encouraged to ambulate frequently (about every hour during waking hours) to help prevent blood clots from forming in your legs or lungs.  However, you should not engage in any heavy lifting (> 10-15 lbs), strenuous activity, or straining.   Diet: You should advance your diet as instructed by your physician.  It will be normal to have some bloating, nausea, and abdominal discomfort intermittently.   Prescriptions:  You will be provided a prescription for pain medication to take as needed.  If your pain is not severe enough to require the prescription pain medication, you may take extra strength Tylenol instead which will have less side effects.  You should also take a prescribed stool softener to avoid straining with bowel movements as the prescription pain medication may constipate you.   Incisions: Should be changed and re-packed at least twice daily with wet-to-dry dressings   What to call us about: You should call the office, Dr. Louis Meckel [phone]) if you develop fever > 101 or develop persistent vomiting. Activity:  You are encouraged to ambulate frequently (about every hour during waking hours) to help prevent blood clots from forming in your legs or lungs.  However, you should not engage in any heavy lifting (> 10-15 lbs), strenuous activity, or straining.

## 2013-03-02 NOTE — Progress Notes (Signed)
12 Days Post-Op  Subjective: Pt not being very cooperative with nursing staff.  Spitting on floor, getting up OOB and walking around without assistance tangling wires/lines.  No N/V or abdominal pain.  Tolerating diet well.  Ambulating well.  Having BM's and flatus.  Objective: Vital signs in last 24 hours: Temp:  [98.5 F (36.9 C)-99.1 F (37.3 C)] 98.5 F (36.9 C) (03/02 0516) Pulse Rate:  [87-92] 89 (03/02 0516) Resp:  [18] 18 (03/02 0516) BP: (115-125)/(63-74) 123/74 mmHg (03/02 0516) SpO2:  [92 %-98 %] 93 % (03/02 0805) Weight:  [143 lb (64.864 kg)] 143 lb (64.864 kg) (03/02 0516) Last BM Date: 02/28/13  Intake/Output from previous day: 03/01 0701 - 03/02 0700 In: 1130 [P.O.:840; I.V.:240; IV Piggyback:50] Out: 4500 [Urine:4500] Intake/Output this shift:    PE: Gen:  Alert, NAD, pleasant Abd: Soft, NT/ND, +BS, no HSM, midline wound clean with minimal slough GU:  Foley in place   Lab Results:   Recent Labs  02/28/13 0512  WBC 13.4*  HGB 10.8*  HCT 31.7*  PLT 299   BMET  Recent Labs  02/28/13 0512  NA 141  K 3.5*  CL 103  CO2 29  GLUCOSE 102*  BUN 12  CREATININE 0.85  CALCIUM 7.6*   PT/INR No results found for this basename: LABPROT, INR,  in the last 72 hours CMP     Component Value Date/Time   NA 141 02/28/2013 0512   K 3.5* 02/28/2013 0512   CL 103 02/28/2013 0512   CO2 29 02/28/2013 0512   GLUCOSE 102* 02/28/2013 0512   BUN 12 02/28/2013 0512   CREATININE 0.85 02/28/2013 0512   CALCIUM 7.6* 02/28/2013 0512   PROT 5.1* 02/19/2013 0500   ALBUMIN 1.8* 02/19/2013 0500   AST 43* 02/19/2013 0500   ALT 42 02/19/2013 0500   ALKPHOS 105 02/19/2013 0500   BILITOT 1.5* 02/19/2013 0500   GFRNONAA 87* 02/28/2013 0512   GFRAA >90 02/28/2013 0512   Lipase  No results found for this basename: lipase       Studies/Results: No results found.  Anti-infectives: Anti-infectives   Start     Dose/Rate Route Frequency Ordered Stop   02/27/13 2200  metroNIDAZOLE  (FLAGYL) tablet 500 mg     500 mg Oral 3 times per day 02/27/13 1504     02/27/13 1600  cefTRIAXone (ROCEPHIN) 1 g in dextrose 5 % 50 mL IVPB     1 g 100 mL/hr over 30 Minutes Intravenous Every 24 hours 02/27/13 1504     02/23/13 1200  Ampicillin-Sulbactam (UNASYN) 3 g in sodium chloride 0.9 % 100 mL IVPB  Status:  Discontinued     3 g 100 mL/hr over 60 Minutes Intravenous Every 6 hours 02/23/13 1109 02/27/13 1504   02/22/13 1600  piperacillin-tazobactam (ZOSYN) IVPB 3.375 g  Status:  Discontinued     3.375 g 12.5 mL/hr over 240 Minutes Intravenous Every 8 hours 02/22/13 1516 02/23/13 1109   02/22/13 1200  vancomycin (VANCOCIN) IVPB 1000 mg/200 mL premix  Status:  Discontinued     1,000 mg 200 mL/hr over 60 Minutes Intravenous Every 12 hours 02/22/13 1111 02/23/13 1109   02/19/13 2300  vancomycin (VANCOCIN) IVPB 750 mg/150 ml premix  Status:  Discontinued     750 mg 150 mL/hr over 60 Minutes Intravenous Every 12 hours 02/19/13 1436 02/20/13 0943   02/19/13 1500  meropenem (MERREM) 1 g in sodium chloride 0.9 % 100 mL IVPB  Status:  Discontinued  1 g 200 mL/hr over 30 Minutes Intravenous 3 times per day 02/19/13 1436 02/22/13 1455   02/19/13 1000  vancomycin (VANCOCIN) IVPB 750 mg/150 ml premix  Status:  Discontinued     750 mg 150 mL/hr over 60 Minutes Intravenous Every 24 hours 02/18/13 1031 02/19/13 1437   02/18/13 1800  clindamycin (CLEOCIN) IVPB 900 mg  Status:  Discontinued     900 mg 100 mL/hr over 30 Minutes Intravenous 4 times per day 02/18/13 1626 02/20/13 0943   02/18/13 1800  meropenem (MERREM) 500 mg in sodium chloride 0.9 % 50 mL IVPB  Status:  Discontinued     500 mg 100 mL/hr over 30 Minutes Intravenous Every 12 hours 02/18/13 1649 02/19/13 1436   02/18/13 1200  piperacillin-tazobactam (ZOSYN) IVPB 2.25 g  Status:  Discontinued     2.25 g 100 mL/hr over 30 Minutes Intravenous 4 times per day 02/18/13 1026 02/18/13 1626   02/18/13 1100  vancomycin (VANCOCIN) 1,500 mg  in sodium chloride 0.9 % 500 mL IVPB     1,500 mg 250 mL/hr over 120 Minutes Intravenous  Once 02/18/13 1026 02/18/13 1300   02/18/13 0800  cefTRIAXone (ROCEPHIN) 1 g in dextrose 5 % 50 mL IVPB     1 g Intravenous  Once 02/18/13 0752 02/18/13 0848       Assessment/Plan POD #12 s/p Exploratory laparotomy with drainage of retroperitoneal abscess, drainage of Pre-Peritoneal abscess and Exploration of bladder perforation (N/A)  Pelvic abscess - cx grew e.coli S/p TURP procedure for bladder mass Hematuria Leukocytosis - improved on Rocephin (Day 13), Flagyl (Day 13)   Plan: 1.  Wound is healing well. Continue dressing changes.  2.  Penrose drains removed by urology, they are planning cysto at the end of this week 3.  We will check wound a couple of times a week. Call us if any questions. 4.  From wound perspective okay to be discharged when medically stable and when okay with urology.  Will need BID WD dressing changes     LOS: 12 days    DORT, Maverik Foot 03/02/2013, 9:06 AM Pager: [phone]

## 2013-03-02 NOTE — Progress Notes (Signed)
Physical Therapy Wound Treatment Patient Details  Name: Jason [name] MRN: [id number] Date of Birth: Sep 28, 1945  Today's Date: 03/02/2013 Time: 1135-1200 Time Calculation (min): 25 min  Subjective  Subjective: Pt stated "no" when asked if pulsatile lavage was hurting him.  Pain Score: Pain Score: 0-No pain  Wound Assessment  Pressure Ulcer Unstageable - Full thickness tissue loss in which the base of the ulcer is covered by slough (yellow, tan, gray, green or brown) and/or eschar (tan, brown or black) in the wound bed. (Active)  Dressing Type Foam;Santyl;Moist to dry;Barrier Film (skin prep) 03/02/2013  1:43 PM  Dressing Changed 03/02/2013  1:43 PM  Dressing Change Frequency Daily 03/02/2013  1:43 PM  State of Healing Eschar 03/02/2013  1:43 PM  Site / Wound Assessment Black;Brown;Granulation tissue;Painful;Yellow;Other (Comment) 03/02/2013  1:43 PM  % Wound base Red or Granulating 5% 03/02/2013  1:43 PM  % Wound base Yellow 10% 03/02/2013  1:43 PM  % Wound base Black 85% 03/02/2013  1:43 PM  % Wound base Other (Comment) 0% 03/02/2013  1:43 PM  Peri-wound Assessment Intact 03/02/2013  1:43 PM  Wound Length (cm) 10 cm 02/27/2013  1:15 PM  Wound Width (cm) 11 cm 02/27/2013  1:15 PM  Wound Depth (cm) 0.2 cm 02/27/2013  1:15 PM  Margins Unattached edges (unapproximated) 03/02/2013  1:43 PM  Drainage Amount Scant 03/02/2013  1:43 PM  Drainage Description Other (Comment) 02/28/2013  9:13 AM  Treatment Debridement (Selective);Hydrotherapy (Pulse lavage) 03/02/2013  1:43 PM      Hydrotherapy Pulsed lavage therapy - wound location: sacrum Pulsed Lavage with Suction (psi): 12 psi Pulsed Lavage with Suction - Normal Saline Used: 1000 mL Pulsed Lavage Tip: Tip with splash shield Selective Debridement Selective Debridement - Location: sacrum Selective Debridement - Tools Used: Scalpel;Scissors Selective Debridement - Tissue Removed: small amount of black eschar   Wound Assessment and Plan  Wound Therapy -  Assess/Plan/Recommendations Wound Therapy - Clinical Statement: Eschar remains very adherent. Hydrotherapy Plan: Debridement;Dressing change;Patient/family education;Pulsatile lavage with suction Wound Therapy - Frequency: 6X / week Wound Therapy - Follow Up Recommendations: Skilled nursing facility Wound Plan: See plan  Wound Therapy Goals- Improve the function of patient's integumentary system by progressing the wound(s) through the phases of wound healing (inflammation - proliferation - remodeling) by: Decrease Necrotic Tissue to: 80% Decrease Necrotic Tissue - Progress: Progressing toward goal Increase Granulation Tissue to: 20% Increase Granulation Tissue - Progress: Progressing toward goal  Goals will be updated until maximal potential achieved or discharge criteria met.  Discharge criteria: when goals achieved, discharge from hospital, MD decision/surgical intervention, no progress towards goals, refusal/missing three consecutive treatments without notification or medical reason.  GP     Tashya Alberty 03/02/2013, 1:55 PM  [place]

## 2013-03-02 NOTE — Progress Notes (Signed)
    [facility] for Infectious Disease  Date of Admission:  02/18/2013  Antibiotics:  Subjective: For d/c today  Objective: Temp:  [98.5 F (36.9 C)-99.1 F (37.3 C)] 98.5 F (36.9 C) (03/02 0516) Pulse Rate:  [87-92] 89 (03/02 0516) Resp:  [18] 18 (03/02 0516) BP: (115-125)/(63-74) 123/74 mmHg (03/02 0516) SpO2:  [92 %-98 %] 93 % (03/02 0805) Weight:  [143 lb (64.864 kg)] 143 lb (64.864 kg) (03/02 0516)   Lab Results Lab Results  Component Value Date   WBC 13.4* 02/28/2013   HGB 10.8* 02/28/2013   HCT 31.7* 02/28/2013   MCV 70.0* 02/28/2013   PLT 299 02/28/2013    Lab Results  Component Value Date   CREATININE 0.85 02/28/2013   BUN 12 02/28/2013   NA 141 02/28/2013   K 3.5* 02/28/2013   CL 103 02/28/2013   CO2 29 02/28/2013    Lab Results  Component Value Date   ALT 42 02/19/2013   AST 43* 02/19/2013   ALKPHOS 105 02/19/2013   BILITOT 1.5* 02/19/2013      Microbiology: Recent Results (from the past 240 hour(s))  CULTURE, BLOOD (ROUTINE X 2)     Status: None   Collection Time    02/24/13  3:00 PM      Result Value Ref Range Status   Specimen Description BLOOD LEFT ARM   Final   Special Requests BOTTLES DRAWN AEROBIC ONLY 6CC   Final   Culture  Setup Time     Final   Value: 02/24/2013 22:10     Performed at [facility]   Culture     Final   Value: NO GROWTH 5 DAYS     Performed at [facility]   Report Status 03/02/2013 FINAL   Final  CULTURE, BLOOD (ROUTINE X 2)     Status: None   Collection Time    02/24/13  3:10 PM      Result Value Ref Range Status   Specimen Description BLOOD LEFT HAND   Final   Special Requests BOTTLES DRAWN AEROBIC ONLY [facility]   Final   Culture  Setup Time     Final   Value: 02/24/2013 22:11     Performed at [facility]   Culture     Final   Value: NO GROWTH 5 DAYS     Performed at [facility]   Report Status 03/02/2013 FINAL   Final    Studies/Results: No results found.  Assessment/Plan: 1)  Necrotizing fasciitis and E coli bacteremia - should get 4 weeks of IV ceftriaxone and po or IV flagyl through 3/17.   We will arrange follow up at that time  Will need weekly CBC, CMP to [place], [place] for Infectious Disease [place] [email or web address] [phone] pager   [phone] cell 03/02/2013, 10:55 AM

## 2013-03-02 NOTE — Progress Notes (Signed)
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  Subsequently presented to OSH after removing his catheter accidentally.  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  He is s/p ex. Lap/ pelvis washout/debridement, 02/19/13.      Intv/Subj: No acute events past 24 hours. Patient is without complaint. Pain controlled. afebrile     Past Medical History  Diagnosis Date  . Hypertension   . Stroke     Dementia, otherwise, no residual  . Bladder mass 02-10-13    surgery planned for this  . 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 COPD (chronic obstructive pulmonary disease)     previous history and current smoking  . 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 Dementia 02/18/2013   Current Facility-Administered Medications  Medication Dose Route Frequency Provider Last Rate Last Dose  . 0.9 %  sodium chloride infusion   Intravenous Continuous Leone Haven, MD 10 mL/hr at 02/26/13 1303 10 mL/hr at 02/26/13 1303  . acetaminophen (TYLENOL) tablet 650 mg  650 mg Oral Q6H PRN Samuella Cota, MD   650 mg at 02/18/13 1717   Or  . acetaminophen (TYLENOL) suppository 650 mg  650 mg Rectal Q6H PRN Samuella Cota, MD      . albuterol (PROVENTIL) (2.5 MG/3ML) 0.083% nebulizer solution 2.5 mg  2.5 mg Nebulization BID Renee A Kuneff, DO   2.5 mg at 03/01/13 2001  . antiseptic oral rinse (BIOTENE) solution 15 mL  15 mL Mouth Rinse QID Renee A Kuneff, DO   15 mL at 03/01/13 1752  . cefTRIAXone (ROCEPHIN) 1 g in dextrose 5 % 50 mL IVPB  1 g Intravenous Q24H [name], MD   1 g at 03/01/13 1752  . chlorhexidine (PERIDEX) 0.12 % solution 15 mL  15 mL Mouth Rinse BID Renee A Kuneff, DO   15 mL at 03/01/13 2058  . chlorproMAZINE (THORAZINE) tablet 25 mg  25 mg Oral QID PRN Melton Alar, PA-C      . collagenase (SANTYL) ointment   Topical Daily Jonetta Osgood, MD   1 application at 15/17/61 [phone]  . feeding supplement (GLUCERNA SHAKE) (GLUCERNA SHAKE) liquid 237 mL  237 mL Oral Q24H Erlene Quan, RD   237 mL at 03/01/13 1000  . fentaNYL (SUBLIMAZE) 10 mcg/mL in sodium chloride 0.9 % 250 mL infusion  0-400 mcg/hr Intravenous Continuous Renee A Kuneff, DO   50 mcg/hr at 02/19/13 1800  . fentaNYL (SUBLIMAZE) bolus via infusion 25-50 mcg  25-50 mcg Intravenous Q1H PRN Renee A Kuneff, DO      . fentaNYL (SUBLIMAZE) injection 50 mcg  50 mcg Intravenous Q2H PRN Renee A Kuneff, DO   50 mcg at 02/21/13 1700  . folic acid (FOLVITE) tablet 1 mg  1 mg Oral Daily Thurnell Lose, MD   1 mg at 03/01/13 1052  . food thickener (THICK IT) powder   Oral PRN Thurnell Lose, MD      . heparin injection 5,000 Units  5,000 Units Subcutaneous 3 times per day Imogene Burn. Tsuei, MD   5,000 Units at 03/02/13 0554  . hydrocortisone sodium succinate (SOLU-CORTEF) 100 MG injection 15 mg  15 mg Intravenous Daily Shanker Kristeen Mans, MD      . insulin aspart (novoLOG) injection 0-9 Units  0-9 Units Subcutaneous TID AC & HS Shanker Kristeen Mans, MD  2 Units at 03/01/13 2205  . iron polysaccharides (NIFEREX) capsule 150 mg  150 mg Oral Daily Melton Alar, PA-C   150 mg at 03/01/13 1053  . metoprolol (LOPRESSOR) injection 2.5-5 mg  2.5-5 mg Intravenous Q3H PRN Colbert Coyer, MD      . metroNIDAZOLE (FLAGYL) tablet 500 mg  500 mg Oral 3 times per day [name], MD   500 mg at 03/02/13 0554  . multivitamin with minerals tablet 1 tablet  1 tablet Oral Daily Erlene Quan, RD   1 tablet at 03/01/13 1052  . ondansetron (ZOFRAN) tablet 4 mg  4 mg Oral Q6H PRN Samuella Cota, MD       Or  . ondansetron [facility]) injection 4 mg  4 mg Intravenous Q6H PRN Samuella Cota, MD      . pantoprazole (PROTONIX) EC tablet 40 mg  40 mg Oral Q1200 Thurnell Lose, MD   40 mg at 03/01/13 1245  . sodium chloride 0.9 % bolus 1,000 mL  1,000 mL Intravenous Once Renee A  Kuneff, DO      . sodium chloride 0.9 % injection 10-40 mL  10-40 mL Intracatheter PRN Jonetta Osgood, MD   10 mL at 02/27/13 0523  . thiamine (VITAMIN B-1) tablet 100 mg  100 mg Oral Daily Thurnell Lose, MD   100 mg at 03/01/13 1053    Objective: Vital: Filed Vitals:   03/01/13 1448 03/01/13 2001 03/01/13 2045 03/02/13 0516  BP: 125/72  115/63 123/74  Pulse: 87  92 89  Temp: 98.9 F (37.2 C)  99.1 F (37.3 C) 98.5 F (36.9 C)  TempSrc: Oral  Oral Oral  Resp: 18  18 18   Height:      Weight:    64.864 kg (143 lb)  SpO2: 98% 94% 92% 93%    I/Os: I/O last 3 completed shifts: In: 1860 [P.O.:840; I.V.:970; IV Piggyback:50] Out: 5900 [Urine:5900]  Physical Exam:  General: Patient is in no apparent distress Lungs: coarse breath sounds GI: The abdomen is soft a, nontender.  Midline wound opened/packed and starting to granulate.  Penrose drains removed. Foley is draining clear yellow urine. Ext: lower extremities symmetric  Lab Results:  Recent Labs  02/28/13 0512  WBC 13.4*  HGB 10.8*  HCT 31.7*    Recent Labs  02/28/13 0512  NA 141  K 3.5*  CL 103  CO2 29  GLUCOSE 102*  BUN 12  CREATININE 0.85  CALCIUM 7.6*   No results found for this basename: LABPT, INR,  in the last 72 hours No results found for this basename: LABURIN,  in the last 72 hours Results for orders placed during the hospital encounter of 02/18/13  URINE CULTURE     Status: None   Collection Time    02/18/13  7:20 AM      Result Value Ref Range Status   Specimen Description URINE, CLEAN CATCH   Final   Special Requests NONE   Final   Culture  Setup Time     Final   Value: 02/18/2013 10:26     Performed at [facility]     Final   Value: >=100,000 COLONIES/ML     Performed at [facility]   Culture     Final   Value: ESCHERICHIA COLI     Performed at [facility]   Report Status 02/22/2013 FINAL   Final   Organism ID, Bacteria  ESCHERICHIA COLI    Final  CULTURE, BLOOD (ROUTINE X 2)     Status: None   Collection Time    02/18/13  8:15 AM      Result Value Ref Range Status   Specimen Description BLOOD SITE NOT SPECIFIED DRAWN BY RN   Final   Special Requests BOTTLES DRAWN AEROBIC AND ANAEROBIC 4CC   Final   Culture NO GROWTH 5 DAYS   Final   Report Status 02/23/2013 FINAL   Final  CULTURE, BLOOD (ROUTINE X 2)     Status: None   Collection Time    02/18/13  8:30 AM      Result Value Ref Range Status   Specimen Description BLOOD RIGHT ARM   Final   Special Requests BOTTLES DRAWN AEROBIC ONLY [facility]   Final   Culture  Setup Time     Final   Value: 02/20/2013 01:21     Performed at [facility]   Culture     Final   Value: ESCHERICHIA COLI     1822 Note: Gram Stain Report Called to,Read Back By and Verified With: Girard Cooter 02/19/2013 [place] Performed at [facility]     Performed at [facility]   Report Status 02/22/2013 FINAL   Final   Organism ID, Bacteria ESCHERICHIA COLI   Final  MRSA PCR SCREENING     Status: None   Collection Time    02/18/13 10:13 AM      Result Value Ref Range Status   MRSA by PCR NEGATIVE  NEGATIVE Final   Comment:            The GeneXpert MRSA Assay (FDA     approved for NASAL specimens     only), is one component of a     comprehensive MRSA colonization     surveillance program. It is not     intended to diagnose MRSA     infection nor to guide or     monitor treatment for     MRSA infections.  AFB CULTURE WITH SMEAR     Status: None   Collection Time    02/19/13 12:28 AM      Result Value Ref Range Status   Specimen Description ABSCESS ABDOMEN   Final   Special Requests PRE PERITONEAL   Final   ACID FAST SMEAR     Final   Value: NO ACID FAST BACILLI SEEN     Performed at [facility]   Culture     Final   Value: CULTURE WILL BE EXAMINED FOR 6 WEEKS BEFORE ISSUING A FINAL REPORT     Performed at [facility]   Report Status PENDING   Incomplete   ANAEROBIC CULTURE     Status: None   Collection Time    02/19/13 12:30 AM      Result Value Ref Range Status   Specimen Description ABSCESS ABDOMEN   Final   Special Requests PRE PERITONEAL   Final   Gram Stain     Final   Value: ABUNDANT WBC PRESENT,BOTH PMN AND MONONUCLEAR     NO SQUAMOUS EPITHELIAL CELLS SEEN     MODERATE GRAM NEGATIVE RODS     Performed at [facility]   Culture     Final   Value: NO ANAEROBES ISOLATED     Performed at [facility]   Report Status 02/24/2013 FINAL   Final  CULTURE, ROUTINE-ABSCESS  Status: None   Collection Time    02/19/13 12:31 AM      Result Value Ref Range Status   Specimen Description ABSCESS ABDOMEN   Final   Special Requests PRE PERITONEAL   Final   Gram Stain     Final   Value: ABUNDANT WBC PRESENT,BOTH PMN AND MONONUCLEAR     NO SQUAMOUS EPITHELIAL CELLS SEEN     MODERATE GRAM NEGATIVE RODS     Performed at [facility]   Culture     Final   Value: MODERATE ESCHERICHIA COLI     Performed at [facility]   Report Status 02/21/2013 FINAL   Final   Organism ID, Bacteria ESCHERICHIA COLI   Final  FUNGUS CULTURE W SMEAR     Status: None   Collection Time    02/19/13 12:33 AM      Result Value Ref Range Status   Specimen Description ABSCESS ABDOMEN   Final   Special Requests PRE PERITONEAL   Final   Fungal Smear     Final   Value: NO YEAST OR FUNGAL ELEMENTS SEEN     Performed at [facility]   Culture     Final   Value: CULTURE IN PROGRESS FOR FOUR WEEKS     Performed at [facility]   Report Status PENDING   Incomplete  CULTURE, RESPIRATORY (NON-EXPECTORATED)     Status: None   Collection Time    02/19/13  5:38 AM      Result Value Ref Range Status   Specimen Description TRACHEAL ASPIRATE   Final   Special Requests NONE   Final   Gram Stain     Final   Value: NO WBC SEEN     NO SQUAMOUS EPITHELIAL CELLS SEEN     NO ORGANISMS SEEN     Performed at [facility]    Culture     Final   Value: Non-Pathogenic Oropharyngeal-type Flora Isolated.     Performed at [facility]   Report Status 02/21/2013 FINAL   Final  CULTURE, RESPIRATORY (NON-EXPECTORATED)     Status: None   Collection Time    02/19/13  9:53 AM      Result Value Ref Range Status   Specimen Description TRACHEAL ASPIRATE   Final   Special Requests NONE   Final   Gram Stain     Final   Value: FEW WBC PRESENT, PREDOMINANTLY PMN     RARE SQUAMOUS EPITHELIAL CELLS PRESENT     RARE GRAM POSITIVE COCCI     IN PAIRS     Performed at [facility]   Culture     Final   Value: Non-Pathogenic Oropharyngeal-type Flora Isolated.     Performed at [facility]   Report Status 02/21/2013 FINAL   Final  CULTURE, BLOOD (ROUTINE X 2)     Status: None   Collection Time    02/24/13  3:00 PM      Result Value Ref Range Status   Specimen Description BLOOD LEFT ARM   Final   Special Requests BOTTLES DRAWN AEROBIC ONLY 6CC   Final   Culture  Setup Time     Final   Value: 02/24/2013 22:10     Performed at [facility]   Culture     Final   Value:        BLOOD CULTURE RECEIVED NO GROWTH TO DATE CULTURE WILL BE HELD FOR 5 DAYS  BEFORE ISSUING A FINAL NEGATIVE REPORT     Performed at [facility]   Report Status PENDING   Incomplete  CULTURE, BLOOD (ROUTINE X 2)     Status: None   Collection Time    02/24/13  3:10 PM      Result Value Ref Range Status   Specimen Description BLOOD LEFT HAND   Final   Special Requests BOTTLES DRAWN AEROBIC ONLY [facility]   Final   Culture  Setup Time     Final   Value: 02/24/2013 22:11     Performed at [facility]   Culture     Final   Value:        BLOOD CULTURE RECEIVED NO GROWTH TO DATE CULTURE WILL BE HELD FOR 5 DAYS BEFORE ISSUING A FINAL NEGATIVE REPORT     Performed at [facility]   Report Status PENDING   Incomplete    Assessment: 12 Days Post-Op from pelvic exploration/washout, improving and making great  progress.  E.coli grown from pelvic fluid cultures, blood and urine, resistant to Cipro.   Plan: All drains except foley catheter have been removed.  I will see him back in the office at the end of the week with a cystogram and removal of catheter at that point.  Will discuss pathology with him/family from TURBT and discuss therapy moving forward.  Likely will need intravesical BCG initially once weekly for 6 weeks. Patient will need CT scan with any fevers/pain to r/o abscess formation within his pelvis.  Continue local wound care.  Ardis Hughs 03/02/2013, 7:39 AM

## 2013-03-02 NOTE — Progress Notes (Signed)
Physical Therapy Treatment Patient Details Name: Jason [name] MRN: [id number] DOB: 01-30-1945 Today's Date: 03/02/2013 Time: 4696-2952 PT Time Calculation (min): 15 min  PT Assessment / Plan / Recommendation  History of Present Illness  68 yo AAF s/p TURP procedure, presented to [place] ED after pulling out his foley catheter.  He was seen by urology for hematuria, found to have a lobulated mass in bladder and and underwent cystoscopy, TURBT and was discharged home with Foley on 2/13 from  [facility].  This admission his initial lab work revealed ARF, UTI, suspected aspiration pneumonia, and sepsis. He was febrile and hypoxic, with labs and symptoms of sepsis. CT scan ab/pelvis discussed w/ evidence of soft tissue infection in the pelvis with extension to the fascia of the rectus muscles and of the left internal obturator muscle consistent with necrotizing fasciitis. Underwent EXPLORATORY LAPAROTOMY drainage of retroperitoneal abscess, drainage of Pre-Peritoneal abscess and Exploration of bladder perforation Pt intubated 2/18- 2/20. Pt with history of CVA, dementia and ETOH abuse.   PT Comments   Pt making progress but will still require SNF.  Follow Up Recommendations  SNF     Does the patient have the potential to tolerate intense rehabilitation     Barriers to Discharge        Equipment Recommendations  None recommended by PT    Recommendations for Other Services    Frequency Min 2X/week   Progress towards PT Goals Progress towards PT goals: Progressing toward goals  Plan Current plan remains appropriate    Precautions / Restrictions Precautions Precautions: Fall   Pertinent Vitals/Pain Pt c/o soreness of sacrum.    Mobility  Transfers Overall transfer level: Needs assistance Equipment used: None Transfers: Sit to/from Stand Sit to Stand: Min assist General transfer comment: Assist for balance Ambulation/Gait Ambulation/Gait assistance: Min assist Ambulation  Distance (Feet): 150 Feet Assistive device: None Gait Pattern/deviations: Step-through pattern;Decreased stride length;Narrow base of support Gait velocity interpretation: Below normal speed for age/gender General Gait Details: Unsteady requiring assist for balance    Exercises     PT Diagnosis:    PT Problem List:   PT Treatment Interventions:     PT Goals (current goals can now be found in the care plan section)    Visit Information  Last PT Received On: 70/02/15 Assistance Needed: +1 History of Present Illness:  68 yo AAF s/p TURP procedure, presented to [place] ED after pulling out his foley catheter.  He was seen by urology for hematuria, found to have a lobulated mass in bladder and and underwent cystoscopy, TURBT and was discharged home with Foley on 2/13 from  [facility].  This admission his initial lab work revealed ARF, UTI, suspected aspiration pneumonia, and sepsis. He was febrile and hypoxic, with labs and symptoms of sepsis. CT scan ab/pelvis discussed w/ evidence of soft tissue infection in the pelvis with extension to the fascia of the rectus muscles and of the left internal obturator muscle consistent with necrotizing fasciitis. Underwent EXPLORATORY LAPAROTOMY drainage of retroperitoneal abscess, drainage of Pre-Peritoneal abscess and Exploration of bladder perforation Pt intubated 2/18- 2/20. Pt with history of CVA, dementia and ETOH abuse.    Subjective Data      Cognition  Cognition Arousal/Alertness: Awake/alert Behavior During Therapy: WFL for tasks assessed/performed Overall Cognitive Status: No family/caregiver present to determine baseline cognitive functioning Orientation Level: Disoriented to;Place;Time;Situation Memory: Decreased recall of precautions;Decreased short-term memory Safety/Judgement: Decreased awareness of safety;Decreased awareness of deficits Problem Solving: Requires verbal  cues    Balance  Balance Standing balance support:  No upper extremity supported Standing balance-Leahy Scale: Fair  End of Session PT - End of Session Activity Tolerance: Patient tolerated treatment well Patient left: in chair;with call bell/phone within reach;with chair alarm set   GP     [facility] 03/02/2013, 10:06 AM  [facility] PT [phone]

## 2013-03-02 NOTE — Clinical Social Work Note (Signed)
Clinical Social Work Department CLINICAL SOCIAL WORK PLACEMENT NOTE 03/02/2013  Patient:  Jason [name], Jason [name]  Account Number:  [id number] [place] date:  02/18/2013  Clinical Social Worker:  Kemper Durie, [place]  Date/time:  02/23/2013 01:30 PM  Clinical Social Work is seeking post-discharge placement for this patient at the following level of care:   [place]   (*CSW will update this form in Epic as items are completed)   02/23/2013  Patient/family provided with [place] Department of Clinical Social Work's list of facilities offering this level of care within the geographic area requested by the patient (or if unable, by the patient's family).  02/23/2013  Patient/family informed of their freedom to choose among providers that offer the needed level of care, that participate in Medicare, Medicaid or managed care program needed by the patient, have an available bed and are willing to accept the patient.  02/23/2013  Patient/family informed of [facility]' ownership interest in [facility], as well as of the fact that they are under no obligation to receive care at this facility.  PASARR submitted to EDS on  PASARR number received from [place] on   FL2 transmitted to all facilities in geographic area requested by pt/family on  02/24/2013 FL2 transmitted to all facilities within larger geographic area on   Patient informed that his/her managed care company has contracts with or will negotiate with  certain facilities, including the following:     Patient/family informed of bed offers received:  02/25/2013 Patient chooses bed at [place] Physician recommends and patient chooses bed at    Patient to be transferred to [facility] on  03/02/2013 Patient to be transferred to facility by [facility] transport  The following physician request were entered in Epic:   Additional Comments: Daughter chose bed at [facility] which has a contract with [facility].   Per MD patient ready to Dc to [facility]. RN, patient, daughter, and facility notified of DC. RN given number for report. Dc packet left with RN. [facility] social worker is sending transport for patient. CSW signing off at this time.   Liz Beach, [place], [place], [phone]

## 2013-03-02 NOTE — Discharge Summary (Addendum)
PATIENT DETAILS Name: Jason [name] Age: 68 y.o. Sex: male Date of Birth: Sep 04, 1945 MRN: [id number]. Admit Date: 02/18/2013 Admitting Physician: Juanito Doom, MD [id number] NICHOLAS, MD  Recommendations for Outpatient Follow-up:  1. Foley catheter will be removed by urology on followup in their office 2. Wet-to-dry dressings at the incision site twice a day 3. For Sacral Ulcer:daily Santyl, cover with moist gauze, and foam. Change daily and follow up in a wound care center of the patients choice. 4. Please repeat CBC and chemistries in one week 5. Patient will need CT scan if any fevers/pain to r/o abscess formation within his pelvis. 6. Continue with antibiotics for 4 weeks from 02/19/13- will need followup at the infectious disease clinic for antibiotics are stopped. 7. Will need weekly CBC, CMP faxed to [facility])   PRIMARY DISCHARGE DIAGNOSIS:  Principal Problem:   Sepsis Active Problems:   ALCOHOL ABUSE   Memory loss   UTI (lower urinary tract infection)   Aspiration pneumonia   Acute respiratory failure with hypoxia   Acute renal failure   Dementia   Hematuria   Urothelial carcinoma      PAST MEDICAL HISTORY: Past Medical History  Diagnosis Date  . Hypertension   . Stroke     Dementia, otherwise, no residual  . Bladder mass 02-10-13    surgery planned for this  . 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 COPD (chronic obstructive pulmonary disease)     previous history and current smoking  . 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 Dementia 02/18/2013    DISCHARGE MEDICATIONS:   Medication List    STOP taking these medications       aspirin EC 81 MG tablet     benazepril 20 MG tablet  Commonly known as:  LOTENSIN     cetirizine 10 MG tablet  Commonly known as:  ZYRTEC     donepezil 10 MG tablet  Commonly known as:  ARICEPT      TAKE these medications       acetaminophen 325 MG tablet  Commonly known as:   TYLENOL  Take 2 tablets (650 mg total) by mouth every 6 (six) hours as needed for mild pain (or Fever >/= 101).     collagenase ointment  Commonly known as:  SANTYL  Apply topically daily. Apply to sacral wound daily.     dextrose 5 % SOLN 50 mL with cefTRIAXone 1 G SOLR 1 g  Inject 1 g into the vein daily. For 4 weeks from 02/19/13.     docusate sodium 100 MG capsule  Commonly known as:  COLACE  Take 1 capsule (100 mg total) by mouth 2 (two) times daily as needed (take to keep stool soft.).     feeding supplement (GLUCERNA SHAKE) Liqd  Take 237 mLs by mouth daily.     folic acid 1 MG tablet  Commonly known as:  FOLVITE  Take 1 tablet (1 mg total) by mouth daily.     insulin aspart 100 UNIT/ML injection  Commonly known as:  novoLOG  - 0-9 Units, Subcutaneous, 3 times daily before meals & bedtime  - CBG < 70: implement hypoglycemia protocol  - CBG 70 - 120: 0 units  - CBG 121 - 150: 1 unit  - CBG 151 - 200: 2 units  - CBG 201 - 250: 3 units  - CBG 251 - 300: 5 units  - CBG 301 - 350: 7 units  -  CBG 351 - 400: 9 units  - CBG > 400     iron polysaccharides 150 MG capsule  Commonly known as:  NIFEREX  Take 1 capsule (150 mg total) by mouth daily.     metroNIDAZOLE 500 MG tablet  Commonly known as:  FLAGYL  Take 1 tablet (500 mg total) by mouth every 8 (eight) hours. For 4 weeks from 02/19/13     multivitamin with minerals Tabs tablet  Take 1 tablet by mouth daily.     ondansetron 4 MG tablet  Commonly known as:  ZOFRAN  Take 1 tablet (4 mg total) by mouth every 6 (six) hours as needed for nausea.     oxyCODONE 5 MG immediate release tablet  Commonly known as:  ROXICODONE  Take 1-2 tablets (5-10 mg total) by mouth every 4 (four) hours as needed for severe pain.     pantoprazole 40 MG tablet  Commonly known as:  PROTONIX  Take 1 tablet (40 mg total) by mouth daily at 12 noon.     predniSONE 10 MG tablet  Commonly known as:  DELTASONE  - Take 2 tablets (20  mg) daily for 2 days, then,  - Take 1 tablets (10 mg) daily for 2 days, then,  - Take 0.5 tablets (5 mg) daily for 1 days, then stop     thiamine 100 MG tablet  Take 1 tablet (100 mg total) by mouth daily.        ALLERGIES:  No Known Allergies  BRIEF HPI:  See H&P, Labs, Consult and Test reports for all details in brief,67 yo AAF s/p TURP procedure and discharged home on 2/13, presented to [place] ED on 2/18 after pulling out his foley catheter. Initial lab work revealed ARF, UTI, suspected aspiration pneumonia, and sepsis. He was febrile and hypoxic, with labs and symptoms of sepsis. CT scan ab/pelvis discussed w/ evidence of soft tissue infection in the pelvis with extension to the fascia of the rectus muscles and of the left internal obturator muscle consistent with necrotizing fasciitis. Patient was then admitted by pulmonary critical care medicine.  CONSULTATIONS:   pulmonary/intensive care, ID, general surgery and urology  PERTINENT RADIOLOGIC STUDIES: Ct Abdomen Pelvis Wo Contrast  02/18/2013   CLINICAL DATA:  Fever. Recent transurethral resection of bladder tumor. Extraluminal gas collection in the left side of the pelvis.  EXAM: CT ABDOMEN AND PELVIS WITHOUT CONTRAST  TECHNIQUE: Multidetector CT imaging of the abdomen and pelvis was performed following the standard protocol without intravenous contrast.  COMPARISON:  Radiograph dated 02/18/2013 and CT scan dated 08/30/2006  FINDINGS: There are bilateral extensive lower lobe consolidative pulmonary infiltrates. No effusions. Heart size is normal. Tiny hiatal hernia.  There is extensive extraluminal air in the soft tissues of the pelvis including the presacral space and along the left side of the pelvis along the fascia of the left antral operator muscle as well as extending anteriorly along the deep fascia of the rectus muscles. This is consistent with and anaerobic infection and possible necrotizing fasciitis.  The liver, biliary  tree, spleen, pancreas, adrenal glands, and kidneys demonstrate no significant abnormality. The bowel appears normal. Foley catheter is in place in the bladder with a small amount of air in the bladder.  IMPRESSION: 1. Evidence consistent with anaerobic soft tissue infection in the pelvis with extension to the fascia of the rectus muscles and of the left internal obturator muscle consistent with necrotizing fasciitis. No visible pus collections in the pelvis. 2. Extensive  bilateral lower lobe pneumonia.  Critical Value/emergent results were called by telephone at the time of interpretation on 02/18/2013 at 3:55 PM to Dr. Murray Hodgkins , who verbally acknowledged these results.   Electronically Signed   By: Rozetta Nunnery M.D.   On: 02/18/2013 15:56   Dg Chest 2 View  02/23/2013   CLINICAL DATA:  68 year old male with cough, congestion and pneumonia.  EXAM: CHEST  2 VIEW  COMPARISON:  02/22/2013  FINDINGS: Cardiomediastinal silhouette is stable.  Bilateral lower lung atelectasis/ airspace disease/ consolidation noted with probable bilateral pleural effusions.  There is no evidence of pneumothorax, pulmonary edema or acute bony abnormality.  IMPRESSION: Unchanged bilateral lower lung atelectasis/ airspace disease/ consolidation with probable bilateral pleural effusions.   Electronically Signed   By: Hassan Rowan M.D.   On: 02/23/2013 01:45   US Renal  02/18/2013   CLINICAL DATA:  Acute renal failure  EXAM: RENAL/URINARY TRACT ULTRASOUND COMPLETE  COMPARISON:  01/13/2013  FINDINGS: Right Kidney:  Length: 11.2 cm.  No mass or hydronephrosis.  Left Kidney:  Length: 11.0 cm.  No mass or hydronephrosis.  Bladder:  Poorly visualized/decompressed by indwelling Foley catheter.  IMPRESSION: No hydronephrosis.  Bladder poorly visualized/decompressed by indwelling Foley catheter.   Electronically Signed   By: Julian Hy M.D.   On: 02/18/2013 13:36   Dg Chest Port 1 View  02/22/2013   CLINICAL DATA:  Basilar airspace  disease and effusions  EXAM: PORTABLE CHEST - 1 VIEW  COMPARISON:  02/21/2013  FINDINGS: Normal heart size and vascularity. Persistent bibasilar atelectasis/consolidation noted with pleural effusions. Given changes in technique, no significant interval change. No pneumothorax. Trachea is midline.  IMPRESSION: Persistent bibasilar atelectasis/consolidation with associated pleural effusions.   Electronically Signed   By: Daryll Brod M.D.   On: 02/22/2013 08:05   Dg Chest Port 1 View  02/21/2013   CLINICAL DATA:  Shortness of breath.  Marland Kitchen  EXAM: PORTABLE CHEST - 1 VIEW  COMPARISON:  DG CHEST 1V PORT dated 02/21/2013  FINDINGS: Mediastinum and hilar structures are normal. Progressive atelectasis right lower lobe. Developing infiltrate left lower lobe. Bilateral pleural effusions. No pneumothorax. Heart size normal. No acute osseous abnormality.  IMPRESSION: 1.  Progressive atelectasis and/or infiltrate right lower lobe.  2.  Developing infiltrate left lower lobe.  3.  Bilateral small pleural effusions.   Electronically Signed   By: Marcello Moores  Register   On: 02/21/2013 23:39   Dg Chest Port 1 View  02/21/2013   CLINICAL DATA:  Pneumonia, extubated  EXAM: PORTABLE CHEST - 1 VIEW  COMPARISON:  02/20/2013  FINDINGS: Interval extubation. Right IJ central line and NG tube have also been removed. No pneumothorax. Slight worsening basilar airspace disease and left lower lobe consolidation/ collapse. Small effusions not excluded. Stable upper lobe aeration.  IMPRESSION: Interval extubation.  Slight worsening basilar airspace process and left lower lobe collapse/consolidation, compatible with pneumonia.   Electronically Signed   By: Daryll Brod M.D.   On: 02/21/2013 07:42   Dg Chest Port 1 View  02/20/2013   CLINICAL DATA:  68 year old male intubated. Sepsis. Initial encounter.  EXAM: PORTABLE CHEST - 1 VIEW  COMPARISON:  02/1913 and earlier.  FINDINGS: Portable AP semi upright view at 0527 hr. Endotracheal tube tip  projects below the clavicles now all, about 3 cm above the carina. Enteric tube courses to the left upper quadrant, tip not included. Stable right IJ central line.  Improved bibasilar ventilation with better definition of the diaphragm. Residual  patchy and reticular nodular medial lung base density greater on the right. No pneumothorax. No edema. No definite effusion. Stable cardiac size and mediastinal contours.  IMPRESSION: 1. Lines and tubes appear appropriately placed as above. 2. Improved ventilation. Residual medial lung base opacity greater on the right.   Electronically Signed   By: Lars Pinks M.D.   On: 02/20/2013 07:26   Dg Chest Port 1 View  02/19/2013   CLINICAL DATA:  Repositioning of endotracheal tube.  EXAM: PORTABLE CHEST - 1 VIEW  COMPARISON:  DG CHEST 1V PORT dated 02/19/2013  FINDINGS: The endotracheal tube tip now lies 5.7 cm above the crotch of the carina. The lungs are well-expanded. Increased density at the right lung base persists and is consistent with atelectasis or pneumonia. The cardiac silhouette is not enlarged. The pulmonary vascularity is not engorged. The esophagogastric tube tip and proximal port project below the inferior margin of the image. The right internal jugular venous catheter tip lies in the region of the midportion of the SVC.  IMPRESSION: There has been adequate interval repositioning of the endotracheal tube. The remainder of the examination is unchanged.   Electronically Signed   By: David  Martinique   On: 02/19/2013 10:26   Dg Chest Port 1 View  02/19/2013   CLINICAL DATA:  Post bronchoscopy  EXAM: PORTABLE CHEST - 1 VIEW  COMPARISON:  Portable exam 0952 hr compared to the 0026 hr  FINDINGS: Tip of endotracheal tube 11 cm above carina.  Right jugular central venous catheter tip projects over SVC.  Normal heart size and mediastinal contours.  Bibasilar opacities question infiltrate versus atelectasis greater on right.  Upper lobes clear.  No pleural effusion or  pneumothorax.  IMPRESSION: Bibasilar opacities greater on right question atelectasis versus infiltrate.  High endotracheal tube position 11 cm above carina, consider advancing tube 5 cm for more stable positioning within the thoracic trachea.  Findings called to [facility] on [address] [place] on 02/19/2013 at 1004 hr.   Electronically Signed   By: Lavonia Dana M.D.   On: 02/19/2013 10:04   Dg Chest Port 1 View  02/19/2013   CLINICAL DATA:  Endotracheal tube placement  EXAM: PORTABLE CHEST - 1 VIEW  COMPARISON:  DG CHEST 1V PORT dated 02/18/2013  FINDINGS: Endotracheal tube is positioned 6 cm from the carina. NG to extends into the stomach. Side port is just above the GE junction. Right central venous line placement of the distal SVC.  There is new right lower lobe atelectasis.  Upper lungs are clear.  IMPRESSION: 1. Endotracheal tube appears in good position. 2. New right lower lobe atelectasis. 3. Interval placement NG tube with side port is just above GE junction.   Electronically Signed   By: Suzy Bouchard M.D.   On: 02/19/2013 00:46   Dg Chest Portable 1 View  02/18/2013   CLINICAL DATA:  Evaluate right central venous line insertion  EXAM: PORTABLE CHEST - 1 VIEW  COMPARISON:  DG CHEST 1V PORT dated 02/18/2013  FINDINGS: There is a right central venous line with tip in the distal SVC. No pneumothorax. Normal cardiac silhouette. There is bibasilar airspace disease greater on the right which is not to be changed from prior. No upper lobe airspace disease.  IMPRESSION: 1. Interval placement right central venous line without complication. 2. Bibasilar asymmetric airspace disease is not changed.   Electronically Signed   By: Suzy Bouchard M.D.   On: 02/18/2013 21:19   Dg Chest Portable 1  View  02/18/2013   CLINICAL DATA:  Hypoxia, COPD  EXAM: PORTABLE CHEST - 1 VIEW  COMPARISON:  01/27/2013  FINDINGS: Cardiac shadow is stable. Bilateral basilar infiltrates are now seen new from the prior exam. The bony structures  are within normal limits.  IMPRESSION: Bibasilar infiltrates.   Electronically Signed   By: Inez Catalina M.D.   On: 02/18/2013 08:28   Dg Abd 2 Views  02/22/2013   CLINICAL DATA:  Abdominal pain  EXAM: ABDOMEN - 2 VIEW  COMPARISON:  CT ABD/PELV WO CM dated 02/18/2013  FINDINGS: There is no evidence of free intraperitoneal gas. Mild distention of small and large bowel loops in and ileus pattern are noted. Surgical drain projects over the pelvis. Multiple Penrose drains project over the pelvis.  IMPRESSION: No free intraperitoneal gas.  Ileus.  Drains project over the pelvis.   Electronically Signed   By: Maryclare Bean M.D.   On: 02/22/2013 13:11   Dg Abd 2 Views  02/18/2013   CLINICAL DATA:  Bladder tumor, urothelial carcinoma, possible vomiting, post TURBT 02/13/2013  EXAM: ABDOMEN - 2 VIEW  COMPARISON:  None  FINDINGS: Bibasilar infiltrates question pneumonia versus aspiration.  Air-filled nondistended small bowel loops the mid abdomen, question postoperative ileus.  Bubbly gas identified in the pelvis, could be related to stool within the distal colon but extending farther left lateral than is typically seen with rectal vault stool, raising question of extraperitoneal gas in the left pelvis.  Bones unremarkable.  No urinary tract calcification.  No definite free intraperitoneal air.  IMPRESSION: Bibasilar opacities question pneumonia versus aspiration.  Potential postoperative ileus.  Bubbly gas extending farther left laterally in the inferior pelvis than is typically seen with stool, question prominent stool versus extraperitoneal extraluminal gas in patient who has had a recent cystoscopy and TURBT ; CT imaging of the abdomen and pelvis with IV and oral contrast recommended.  Findings called to Dr. Sarajane Jews on 02/18/2013 at 1315 hr.   Electronically Signed   By: Lavonia Dana M.D.   On: 02/18/2013 13:16   Dg Swallowing Func-speech Pathology  02/23/2013   Katherene Ponto Deblois, CCC-SLP     02/23/2013 10:49 AM  Objective Swallowing Evaluation: Modified Barium Swallowing Study   Patient Details  Name: Jason [name] MRN: [id number] Date of Birth: July 16, 1945  Today's Date: 02/23/2013 Time: [id number] SLP Time Calculation (min): 20 min  Past Medical History:  Past Medical History  Diagnosis Date  . Hypertension   . Stroke     Dementia, otherwise, no residual  . Bladder mass 02-10-13    surgery planned for this  . 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 COPD (chronic obstructive pulmonary disease)     previous history and current smoking  . 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 Dementia 02/18/2013   Past Surgical History:  Past Surgical History  Procedure Laterality Date  . Transurethral resection of bladder tumor with gyrus  (turbt-gyrus) N/A 02/13/2013    Procedure: TRANSURETHRAL RESECTION OF BLADDER TUMOR WITH GYRUS  (TURBT-GYRUS), bladder biopsies;  Surgeon: Ardis Hughs,  MD;  Location: [facility];  Service: Urology;  Laterality: N/A;  . Cystoscopy/retrograde/ureteroscopy Bilateral 02/13/2013    Procedure: BILATERAL RETROGRADE;  Surgeon: Ardis Hughs,  MD;  Location: [facility];  Service: Urology;  Laterality: Bilateral;   . Laparotomy N/A 02/18/2013    Procedure: EXPLORATORY LAPAROTOMY drainage of retroperitoneal  abscess, drainage of Pre-Peritoneal abscess and Exploration of  bladder perforation;  Surgeon: Rodman Key  Oren Section, MD;  Location:  [facility] OR;  Service: General;  Laterality: N/A;   HPI:  Severe Sepsis with possible necrotizing fasciitis of the rectus  muscle and left internal obturator , also suspected HCAP -  currently hemodynamically stable, status post exploratory  laparotomy and drainage by general surgery and urology on  02/18/2013, status post intubation and extubation was extubated  on 02/20/2013.      Assessment / Plan / Recommendation Clinical Impression  Clinical impression: Pt demonstrates moderate sensory impairment  impacting safety with large straw sips or with pills given with  liquids.  Pts oral phase is characterized by slow pumping and  prolonged mastication of solids, poor posterior containment with  straw sips and struggle to transit pill. Pharyngeal phase is  strong, but there is decreased coordination and timing with straw  sips allowing for silent aspiration during/after the swallow. The  pt is recommended to consume regular solids and cup sips (may be  moderate to large without severe risk), but should not be given  straws and pills must be given whole in puree as there is  significant aspiration of liquids with pills. Fully upright  posture also very important. Recommend OOB if possible. SLP will  follow for tolerance.     Treatment Recommendation  Therapy as outlined in treatment plan below    Diet Recommendation Regular;Thin liquid   Liquid Administration via: No straw;Cup Medication Administration: Whole meds with puree Supervision: Staff to assist with self feeding;Full  supervision/cueing for compensatory strategies Compensations: Slow rate Postural Changes and/or Swallow Maneuvers: Seated upright 90  degrees;Out of bed for meals    Other  Recommendations Oral Care Recommendations: Oral care BID Other Recommendations: Have oral suction available   Follow Up Recommendations  Skilled Nursing facility    Frequency and Duration min 2x/week  2 weeks   Pertinent Vitals/Pain NA    SLP Swallow Goals     General HPI: Severe Sepsis with possible necrotizing fasciitis of  the rectus muscle and left internal obturator , also suspected  HCAP - currently hemodynamically stable, status post exploratory  laparotomy and drainage by general surgery and urology on  02/18/2013, status post intubation and extubation was extubated  on 02/20/2013.  Type of Study: Modified Barium Swallowing Study Reason for Referral: Objectively evaluate swallowing function Diet Prior to this Study: Dysphagia 3 (soft);Nectar-thick liquids Temperature Spikes Noted: No Respiratory Status: Nasal cannula History of Recent  Intubation: Yes Length of Intubations (days): 2 days Date extubated: 02/20/13 Behavior/Cognition: Alert;Cooperative;Confused;Decreased  sustained attention Oral Cavity - Dentition: Poor condition;Missing dentition Oral Motor / Sensory Function: Within functional limits Self-Feeding Abilities: Able to feed self Patient Positioning: Upright in chair Baseline Vocal Quality: Hoarse Volitional Cough: Other (Comment);Weak (requires max cues) Volitional Swallow:  (NT) Anatomy: Within functional limits Pharyngeal Secretions:  (probable standing secretions based on  vocal quality)    Reason for Referral Objectively evaluate swallowing function   Oral Phase Oral Preparation/Oral Phase Oral Phase: Impaired Oral - Thin Oral - Thin Cup: Lingual/palatal residue Oral - Thin Straw: Lingual/palatal residue;Piecemeal swallowing Oral - Solids Oral - Puree: Lingual/palatal residue;Delayed oral  transit;Reduced posterior propulsion;Lingual pumping Oral - Regular: Reduced posterior propulsion;Delayed oral transit Oral - Pill: Other (Comment) (dramatic posterior head tilt needed  to transit pills)   Pharyngeal Phase Pharyngeal Phase Pharyngeal Phase: Impaired Pharyngeal - Thin Pharyngeal - Thin Cup: Premature spillage to pyriform  sinuses;Pharyngeal residue - valleculae;Pharyngeal residue -  pyriform sinuses (independent second swallow to clear) Pharyngeal - Thin Straw:  Premature spillage to pyriform  sinuses;Penetration/Aspiration after swallow;Moderate aspiration Penetration/Aspiration details (thin straw): Material enters  airway, passes BELOW cords without attempt by patient to eject  out (silent aspiration);Material does not enter airway Pharyngeal - Solids Pharyngeal - Puree: Delayed swallow initiation;Premature spillage  to valleculae Pharyngeal - Regular: Delayed swallow initiation;Premature  spillage to valleculae Pharyngeal - Pill: Delayed swallow  initiation;Penetration/Aspiration before swallow;Moderate  aspiration  Penetration/Aspiration details (pill): Material enters airway,  passes BELOW cords without attempt by patient to eject out  (silent aspiration)  Cervical Esophageal Phase    GO    Cervical Esophageal Phase Cervical Esophageal Phase: [facility]         DeBlois, Katherene Ponto 02/23/2013, 10:48 AM      PERTINENT LAB RESULTS: CBC:  Recent Labs  02/28/13 0512  WBC 13.4*  HGB 10.8*  HCT 31.7*  PLT 299   CMET CMP     Component Value Date/Time   NA 141 02/28/2013 0512   K 3.5* 02/28/2013 0512   CL 103 02/28/2013 0512   CO2 29 02/28/2013 0512   GLUCOSE 102* 02/28/2013 0512   BUN 12 02/28/2013 0512   CREATININE 0.85 02/28/2013 0512   CALCIUM 7.6* 02/28/2013 0512   PROT 5.1* 02/19/2013 0500   ALBUMIN 1.8* 02/19/2013 0500   AST 43* 02/19/2013 0500   ALT 42 02/19/2013 0500   ALKPHOS 105 02/19/2013 0500   BILITOT 1.5* 02/19/2013 0500   GFRNONAA 87* 02/28/2013 0512   GFRAA >90 02/28/2013 0512    GFR Estimated Creatinine Clearance: 75.3 ml/min (by C-G formula based on Cr of 0.85). No results found for this basename: LIPASE, AMYLASE,  in the last 72 hours No results found for this basename: CKTOTAL, CKMB, CKMBINDEX, TROPONINI,  in the last 72 hours No components found with this basename: POCBNP,  No results found for this basename: DDIMER,  in the last 72 hours No results found for this basename: HGBA1C,  in the last 72 hours No results found for this basename: CHOL, HDL, LDLCALC, TRIG, CHOLHDL, LDLDIRECT,  in the last 72 hours No results found for this basename: TSH, T4TOTAL, FREET3, T3FREE, THYROIDAB,  in the last 72 hours No results found for this basename: VITAMINB12, FOLATE, FERRITIN, TIBC, IRON, RETICCTPCT,  in the last 72 hours Coags: No results found for this basename: PT, INR,  in the last 72 hours Microbiology: Recent Results (from the past 240 hour(s))  CULTURE, BLOOD (ROUTINE X 2)     Status: None   Collection Time    02/24/13  3:00 PM      Result Value Ref Range Status   Specimen  Description BLOOD LEFT ARM   Final   Special Requests BOTTLES DRAWN AEROBIC ONLY [place]   Final   Culture  Setup Time     Final   Value: 02/24/2013 22:10     Performed at [facility]   Culture     Final   Value: NO GROWTH 5 DAYS     Performed at [facility]   Report Status 03/02/2013 FINAL   Final  CULTURE, BLOOD (ROUTINE X 2)     Status: None   Collection Time    02/24/13  3:10 PM      Result Value Ref Range Status   Specimen Description BLOOD LEFT HAND   Final   Special Requests BOTTLES DRAWN AEROBIC ONLY [facility]   Final   Culture  Setup Time     Final   Value: 02/24/2013 22:11  Performed at [facility]     Final   Value: NO GROWTH 5 DAYS     Performed at [facility]   Report Status 03/02/2013 FINAL   Final     BRIEF HOSPITAL COURSE:  Severe Sepsis with possible necrotizing fasciitis of the rectus muscle and left internal obturator  - Patient presented with severe sepsis secondary to necrotizing fasciitis of his anterior abdominal wall, he was in acute renal failure. He was just discharged on 2/13 from [facility] after a TURP procedure.He was initially seen at [facility] on 2/28 after he accidentally pulled out his Foley catheter.He was found to have ARF, UTI, suspected aspiration pneumonia and sepsis.Patient was tghen transferred to [facility] under the critical care service. General surgery and urology were consulted. Patient subsequently underwent exploratory laparotomy and drainage by general surgery and urology on 02/18/2013. Postoperatively, he was intubated, and subsequently extubated on 02/20/13. He had numerous drains in his lower the abdominal wall, JP drain was discontinued by general surgery on 2/24, continued to have bilateral Penrose drains, that were slowly backed out by urology, and was subsequently removed by the day of discharge. Currently doing very well, leukocytosis is significantly down  trended, last WBC count was 13.4, down from a peak of 29.2 thousand. Wound and blood cultures are positive for Escherichia coli, current recommendations from infectious disease are to continue with Rocephin and Flagyl for 4 weeks from 02/19/13. Please note today is day 13 of IV antibiotic therapy. He will require wet-to-dry dressings twice a day at his incision site, he would need to followup with urology in 4-5 days for a cystogram, and removal of his Foley catheter. - Do to sepsis physiology, patient was given stress dosing of hydrocortisone, on discharge he would be placed on a few days of tapered prednisone.  E-coli Bacteremia  -1 out 2 blood cultures drawn 2/18 positive for e-coli. Infectious disease was managing patient's antibiotic therapy during his inpatient stay, currently he is on Rocephin and oral Flagyl. Today is day 13 of total antibiotic therapy. Current recommendations from infectious disease (Dr. Scharlene Gloss) history continue with Rocephin and Flagyl, stop date is 4 weeks from 02/19/13. PICC line has been placed on 2/26. Repeat blood cultures on 2/24 negative. -Will need weekly CBC, CMP faxed to [facility])  HCAP / Aspiration Pneumonia - Patient was treated with numerous antibiotics initially, due to narrowed down to Unasyn. However currently on Rocephin and Flagyl as indicated above.Speech evaluation completed. Regular diet with thin liquids recommended. Doing well from this point of view.  Acute renal failure. -Due to severe sepsis.  -Resolved with IV fluids  History of Hematuria and Urothelial carcinoma  -follows with urology at [facility], post discharge he will follow with them.   Sacral decub  - This was present on admission.10 cm x 10 cm in size - During inpatient, physical therapy was consulted for hydrotherapy, however on discharge Hustler suggests daily Santyl, cover with moist gauze, and foam. Change daily and follow up in a wound care center   Lewis:  Subjective:   Bernd Philipp Ovens today has no headache,no chest abdominal pain,no new weakness tingling or numbness, feels much better wants to go home today.   Objective:   Blood pressure 123/74, pulse 89, temperature 98.5 F (36.9 C), temperature source Oral, resp. rate 18, height 6' (1.829 m), weight 64.864 kg (143 lb), SpO2 93.00%.  Intake/Output Summary (Last 24  hours) at 03/02/13 1051 Last data filed at 03/02/13 0932  Gross per 24 hour  Intake    770 ml  Output   2650 ml  Net  -1880 ml   Filed Weights   02/28/13 0500 03/01/13 0622 03/02/13 0516  Weight: 71.668 kg (158 lb) 64.955 kg (143 lb 3.2 oz) 64.864 kg (143 lb)    Exam Awake Alert, Oriented *3, No new F.N deficits, Normal affect [place].AT,PERRAL Supple Neck,No JVD, No cervical lymphadenopathy appriciated.  Symmetrical Chest wall movement, Good air movement bilaterally, CTAB RRR,No Gallops,Rubs or new Murmurs, No Parasternal Heave +ve B.Sounds, Abd Soft, Non tender, No organomegaly appriciated, No rebound -guarding or rigidity. No Cyanosis, Clubbing or edema, No new Rash or bruise  DISCHARGE CONDITION: Stable  DISPOSITION: SNF   DISCHARGE INSTRUCTIONS:    Activity:  As tolerated with Full fall precautions use walker/cane & assistance as needed  Diet recommendation: Heart Healthy diet      Discharge Orders   Future Orders Complete By Expires   Call MD for:  redness, tenderness, or signs of infection (pain, swelling, redness, odor or green/yellow discharge around incision site)  As directed    Call MD for:  temperature >100.4  As directed    Diet - low sodium heart healthy  As directed    Discharge instructions  As directed    Comments:     Incisions: Should be changed and re-packed at least twice daily with wet-to-dry dressings    Please call the office, Dr. Louis Meckel [phone]) if you develop fever > 101 or develop persistent vomiting. Activity:  You are encouraged to ambulate frequently (about  every hour during waking hours) to help prevent blood clots from forming in your legs or lungs.  However, you should not engage in any heavy lifting (> 10-15 lbs), strenuous activity, or straining.   Discharge wound care:  As directed    Comments:     1.Incisions: Should be changed and re-packed at least twice daily with wet-to-dry dressings   2.Sacral Ulcer:daily Santyl, cover with moist gauze, and foam. Change daily and follow up in a wound care center of the patients choice.   Increase activity slowly  As directed       Follow-up Information   Follow up with Ardis Hughs, MD In 4 days. (For wound re-check and foley removal.)    Specialty:  Urology   Contact information:   [facility]  [place] [address] [phone]       Follow up with [name], MD. Schedule an appointment as soon as possible for a visit in 1 week. (after discharge from SNF)    Specialty:  Family Medicine   Contact information:   [address] [place] [address] [phone]       Follow up with [facility]             . Schedule an appointment as soon as possible for a visit in 2 weeks.   Contact information:   [address] [place] [address]      Total Time spent on discharge equals 45 minutes.  SignedOren Binet 03/02/2013 10:51 AM

## 2013-03-02 NOTE — Progress Notes (Signed)
Pt prepared for d/c to SNF. IV d/c'd. Skin intact except as most recently charted. Vitals are stable. Report called to receiving facility. Pt to be transported by [facility].

## 2013-03-03 NOTE — Progress Notes (Signed)
Daughter Shaurya Rawdon picked up patient teeth

## 2013-03-06 ENCOUNTER — Inpatient Hospital Stay (HOSPITAL_COMMUNITY)
Admission: EM | Admit: 2013-03-06 | Discharge: 2013-03-11 | DRG: 871 | Disposition: A | Discharge status: Skilled Nursing Facility | Payer: Medicare (Managed Care) | Attending: Internal Medicine | Admitting: Internal Medicine

## 2013-03-06 ENCOUNTER — Encounter (HOSPITAL_COMMUNITY): Payer: Self-pay | Admitting: Emergency Medicine

## 2013-03-06 DIAGNOSIS — R6520 Severe sepsis without septic shock: Secondary | ICD-10-CM

## 2013-03-06 DIAGNOSIS — Z8673 Personal history of transient ischemic attack (TIA), and cerebral infarction without residual deficits: Secondary | ICD-10-CM

## 2013-03-06 DIAGNOSIS — A419 Sepsis, unspecified organism: Principal | ICD-10-CM

## 2013-03-06 DIAGNOSIS — N390 Urinary tract infection, site not specified: Secondary | ICD-10-CM

## 2013-03-06 DIAGNOSIS — N139 Obstructive and reflux uropathy, unspecified: Secondary | ICD-10-CM | POA: Diagnosis present

## 2013-03-06 DIAGNOSIS — C689 Malignant neoplasm of urinary organ, unspecified: Secondary | ICD-10-CM

## 2013-03-06 DIAGNOSIS — R5383 Other fatigue: Secondary | ICD-10-CM

## 2013-03-06 DIAGNOSIS — K6819 Other retroperitoneal abscess: Secondary | ICD-10-CM | POA: Diagnosis present

## 2013-03-06 DIAGNOSIS — T8149XA Infection following a procedure, other surgical site, initial encounter: Secondary | ICD-10-CM

## 2013-03-06 DIAGNOSIS — J9601 Acute respiratory failure with hypoxia: Secondary | ICD-10-CM | POA: Diagnosis present

## 2013-03-06 DIAGNOSIS — R319 Hematuria, unspecified: Secondary | ICD-10-CM

## 2013-03-06 DIAGNOSIS — C679 Malignant neoplasm of bladder, unspecified: Secondary | ICD-10-CM | POA: Diagnosis present

## 2013-03-06 DIAGNOSIS — T8140XA Infection following a procedure, unspecified, initial encounter: Secondary | ICD-10-CM | POA: Diagnosis present

## 2013-03-06 DIAGNOSIS — J690 Pneumonitis due to inhalation of food and vomit: Secondary | ICD-10-CM

## 2013-03-06 DIAGNOSIS — F0390 Unspecified dementia without behavioral disturbance: Secondary | ICD-10-CM

## 2013-03-06 DIAGNOSIS — L02519 Cutaneous abscess of unspecified hand: Secondary | ICD-10-CM

## 2013-03-06 DIAGNOSIS — Z860100 Personal history of colon polyps, unspecified: Secondary | ICD-10-CM

## 2013-03-06 DIAGNOSIS — E049 Nontoxic goiter, unspecified: Secondary | ICD-10-CM

## 2013-03-06 DIAGNOSIS — Z794 Long term (current) use of insulin: Secondary | ICD-10-CM

## 2013-03-06 DIAGNOSIS — F29 Unspecified psychosis not due to a substance or known physiological condition: Secondary | ICD-10-CM | POA: Diagnosis present

## 2013-03-06 DIAGNOSIS — R6521 Severe sepsis with septic shock: Secondary | ICD-10-CM | POA: Diagnosis present

## 2013-03-06 DIAGNOSIS — R5381 Other malaise: Secondary | ICD-10-CM

## 2013-03-06 DIAGNOSIS — Z7401 Bed confinement status: Secondary | ICD-10-CM

## 2013-03-06 DIAGNOSIS — J4489 Other specified chronic obstructive pulmonary disease: Secondary | ICD-10-CM | POA: Diagnosis present

## 2013-03-06 DIAGNOSIS — Z79899 Other long term (current) drug therapy: Secondary | ICD-10-CM

## 2013-03-06 DIAGNOSIS — F17200 Nicotine dependence, unspecified, uncomplicated: Secondary | ICD-10-CM

## 2013-03-06 DIAGNOSIS — J9600 Acute respiratory failure, unspecified whether with hypoxia or hypercapnia: Secondary | ICD-10-CM | POA: Diagnosis present

## 2013-03-06 DIAGNOSIS — J449 Chronic obstructive pulmonary disease, unspecified: Secondary | ICD-10-CM | POA: Diagnosis present

## 2013-03-06 DIAGNOSIS — I10 Essential (primary) hypertension: Secondary | ICD-10-CM

## 2013-03-06 DIAGNOSIS — L8993 Pressure ulcer of unspecified site, stage 3: Secondary | ICD-10-CM

## 2013-03-06 DIAGNOSIS — Y846 Urinary catheterization as the cause of abnormal reaction of the patient, or of later complication, without mention of misadventure at the time of the procedure: Secondary | ICD-10-CM | POA: Diagnosis present

## 2013-03-06 DIAGNOSIS — G9340 Encephalopathy, unspecified: Secondary | ICD-10-CM

## 2013-03-06 DIAGNOSIS — F1010 Alcohol abuse, uncomplicated: Secondary | ICD-10-CM

## 2013-03-06 DIAGNOSIS — N179 Acute kidney failure, unspecified: Secondary | ICD-10-CM

## 2013-03-06 DIAGNOSIS — N1330 Unspecified hydronephrosis: Secondary | ICD-10-CM

## 2013-03-06 DIAGNOSIS — T8389XA Other specified complication of genitourinary prosthetic devices, implants and grafts, initial encounter: Secondary | ICD-10-CM | POA: Diagnosis present

## 2013-03-06 DIAGNOSIS — L89109 Pressure ulcer of unspecified part of back, unspecified stage: Secondary | ICD-10-CM | POA: Diagnosis present

## 2013-03-06 DIAGNOSIS — R413 Other amnesia: Secondary | ICD-10-CM

## 2013-03-06 DIAGNOSIS — IMO0002 Reserved for concepts with insufficient information to code with codable children: Secondary | ICD-10-CM

## 2013-03-06 DIAGNOSIS — L03119 Cellulitis of unspecified part of limb: Secondary | ICD-10-CM

## 2013-03-06 DIAGNOSIS — Z87898 Personal history of other specified conditions: Secondary | ICD-10-CM

## 2013-03-06 DIAGNOSIS — K651 Peritoneal abscess: Secondary | ICD-10-CM

## 2013-03-06 DIAGNOSIS — T8143XA Infection following a procedure, organ and space surgical site, initial encounter: Secondary | ICD-10-CM

## 2013-03-06 LAB — I-STAT CG4 LACTIC ACID, ED: Lactic Acid, Venous: 3.48 mmol/L — ABNORMAL HIGH (ref 0.5–2.2)

## 2013-03-06 MED ORDER — ACETAMINOPHEN 650 MG RE SUPP
650.0000 mg | Freq: Once | RECTAL | Status: AC
  Administered 2013-03-06: 650 mg via RECTAL
  Filled 2013-03-06: qty 1

## 2013-03-06 MED ORDER — ONDANSETRON HCL 4 MG/2ML IJ SOLN: 4.0000 mg | Freq: Once | INTRAMUSCULAR | Status: DC

## 2013-03-06 MED ORDER — ONDANSETRON HCL 4 MG/2ML IJ SOLN
INTRAMUSCULAR | Status: AC
  Administered 2013-03-06: 4 mg via INTRAVENOUS
  Filled 2013-03-06: qty 2

## 2013-03-06 MED ORDER — ONDANSETRON HCL 4 MG/2ML IJ SOLN
4.0000 mg | Freq: Once | INTRAMUSCULAR | Status: AC
  Administered 2013-03-06: 4 mg via INTRAVENOUS

## 2013-03-06 MED ORDER — MORPHINE SULFATE 4 MG/ML IJ SOLN
4.0000 mg | Freq: Once | INTRAMUSCULAR | Status: AC
Start: 2013-03-07 — End: 2013-03-06
  Administered 2013-03-06: 4 mg via INTRAVENOUS
  Filled 2013-03-06: qty 1

## 2013-03-06 NOTE — ED Notes (Signed)
Patient from [place], with fever and confusion and lethargy. Patient is there for rehab of abdominal wounds.  Patient does have history of dementia.  Patient has only been at [facility] for one week.  Patient also with sacral ulcer.

## 2013-03-07 ENCOUNTER — Inpatient Hospital Stay (HOSPITAL_COMMUNITY): Payer: Medicare (Managed Care)

## 2013-03-07 ENCOUNTER — Emergency Department (HOSPITAL_COMMUNITY): Payer: Medicare (Managed Care)

## 2013-03-07 ENCOUNTER — Encounter (HOSPITAL_COMMUNITY): Payer: Self-pay | Admitting: Radiology

## 2013-03-07 DIAGNOSIS — A419 Sepsis, unspecified organism: Principal | ICD-10-CM

## 2013-03-07 DIAGNOSIS — G9340 Encephalopathy, unspecified: Secondary | ICD-10-CM | POA: Diagnosis present

## 2013-03-07 DIAGNOSIS — F0390 Unspecified dementia without behavioral disturbance: Secondary | ICD-10-CM

## 2013-03-07 DIAGNOSIS — N179 Acute kidney failure, unspecified: Secondary | ICD-10-CM

## 2013-03-07 DIAGNOSIS — T8143XA Infection following a procedure, organ and space surgical site, initial encounter: Secondary | ICD-10-CM | POA: Diagnosis present

## 2013-03-07 DIAGNOSIS — I10 Essential (primary) hypertension: Secondary | ICD-10-CM

## 2013-03-07 DIAGNOSIS — F17200 Nicotine dependence, unspecified, uncomplicated: Secondary | ICD-10-CM

## 2013-03-07 DIAGNOSIS — N390 Urinary tract infection, site not specified: Secondary | ICD-10-CM

## 2013-03-07 DIAGNOSIS — C801 Malignant (primary) neoplasm, unspecified: Secondary | ICD-10-CM

## 2013-03-07 DIAGNOSIS — N1330 Unspecified hydronephrosis: Secondary | ICD-10-CM | POA: Diagnosis present

## 2013-03-07 DIAGNOSIS — R319 Hematuria, unspecified: Secondary | ICD-10-CM

## 2013-03-07 DIAGNOSIS — K651 Peritoneal abscess: Secondary | ICD-10-CM

## 2013-03-07 DIAGNOSIS — T8149XA Infection following a procedure, other surgical site, initial encounter: Secondary | ICD-10-CM

## 2013-03-07 DIAGNOSIS — L8993 Pressure ulcer of unspecified site, stage 3: Secondary | ICD-10-CM | POA: Diagnosis present

## 2013-03-07 LAB — CBC WITH DIFFERENTIAL/PLATELET
BASOS ABS: 0 10*3/uL (ref 0.0–0.1)
Basophils Relative: 0 % (ref 0–1)
EOS PCT: 0 % (ref 0–5)
Eosinophils Absolute: 0 10*3/uL (ref 0.0–0.7)
HCT: 34.9 % — ABNORMAL LOW (ref 39.0–52.0)
Hemoglobin: 12.1 g/dL — ABNORMAL LOW (ref 13.0–17.0)
LYMPHS ABS: 0.6 10*3/uL — AB (ref 0.7–4.0)
LYMPHS PCT: 2 % — AB (ref 12–46)
MCH: 24.3 pg — ABNORMAL LOW (ref 26.0–34.0)
MCHC: 34.7 g/dL (ref 30.0–36.0)
MCV: 70.2 fL — AB (ref 78.0–100.0)
Monocytes Absolute: 0.8 10*3/uL (ref 0.1–1.0)
Monocytes Relative: 3 % (ref 3–12)
NEUTROS ABS: 26 10*3/uL — AB (ref 1.7–7.7)
NEUTROS PCT: 95 % — AB (ref 43–77)
PLATELETS: 146 10*3/uL — AB (ref 150–400)
RBC: 4.97 MIL/uL (ref 4.22–5.81)
RDW: 15.2 % (ref 11.5–15.5)
WBC: 27.4 10*3/uL — ABNORMAL HIGH (ref 4.0–10.5)

## 2013-03-07 LAB — BASIC METABOLIC PANEL
BUN: 17 mg/dL (ref 6–23)
CO2: 22 meq/L (ref 19–32)
Calcium: 7.6 mg/dL — ABNORMAL LOW (ref 8.4–10.5)
Chloride: 106 mEq/L (ref 96–112)
Creatinine, Ser: 1.71 mg/dL — ABNORMAL HIGH (ref 0.50–1.35)
GFR calc Af Amer: 45 mL/min — ABNORMAL LOW (ref >90)
GFR, EST NON AFRICAN AMERICAN: 39 mL/min — AB (ref >90)
GLUCOSE: 101 mg/dL — AB (ref 70–99)
Potassium: 4.3 mEq/L (ref 3.7–5.3)
SODIUM: 139 meq/L (ref 137–147)

## 2013-03-07 LAB — CBC
HCT: 34.4 % — ABNORMAL LOW (ref 39.0–52.0)
Hemoglobin: 11.5 g/dL — ABNORMAL LOW (ref 13.0–17.0)
MCH: 23.8 pg — AB (ref 26.0–34.0)
MCHC: 33.4 g/dL (ref 30.0–36.0)
MCV: 71.1 fL — ABNORMAL LOW (ref 78.0–100.0)
Platelets: 119 10*3/uL — ABNORMAL LOW (ref 150–400)
RBC: 4.84 MIL/uL (ref 4.22–5.81)
RDW: 15.7 % — ABNORMAL HIGH (ref 11.5–15.5)
WBC: 31.9 10*3/uL — ABNORMAL HIGH (ref 4.0–10.5)

## 2013-03-07 LAB — COMPREHENSIVE METABOLIC PANEL
ALK PHOS: 102 U/L (ref 39–117)
ALT: 25 U/L (ref 0–53)
AST: 21 U/L (ref 0–37)
Albumin: 2.4 g/dL — ABNORMAL LOW (ref 3.5–5.2)
BUN: 22 mg/dL (ref 6–23)
CO2: 22 meq/L (ref 19–32)
Calcium: 8.3 mg/dL — ABNORMAL LOW (ref 8.4–10.5)
Chloride: 101 mEq/L (ref 96–112)
Creatinine, Ser: 1.72 mg/dL — ABNORMAL HIGH (ref 0.50–1.35)
GFR calc Af Amer: 45 mL/min — ABNORMAL LOW (ref >90)
GFR, EST NON AFRICAN AMERICAN: 39 mL/min — AB (ref >90)
Glucose, Bld: 98 mg/dL (ref 70–99)
POTASSIUM: 4.5 meq/L (ref 3.7–5.3)
SODIUM: 139 meq/L (ref 137–147)
Total Bilirubin: 0.7 mg/dL (ref 0.3–1.2)
Total Protein: 6.4 g/dL (ref 6.0–8.3)

## 2013-03-07 LAB — URINALYSIS, ROUTINE W REFLEX MICROSCOPIC
Bilirubin Urine: NEGATIVE
Glucose, UA: NEGATIVE mg/dL
KETONES UR: 15 mg/dL — AB
NITRITE: NEGATIVE
PH: 6 (ref 5.0–8.0)
Protein, ur: 100 mg/dL — AB
Specific Gravity, Urine: 1.023 (ref 1.005–1.030)
Urobilinogen, UA: 0.2 mg/dL (ref 0.0–1.0)

## 2013-03-07 LAB — URINE MICROSCOPIC-ADD ON

## 2013-03-07 LAB — GLUCOSE, CAPILLARY: Glucose-Capillary: 96 mg/dL (ref 70–99)

## 2013-03-07 MED ORDER — ALUM & MAG HYDROXIDE-SIMETH 200-200-20 MG/5ML PO SUSP
30.0000 mL | Freq: Four times a day (QID) | ORAL | Status: DC | PRN
  Filled 2013-03-07: qty 30

## 2013-03-07 MED ORDER — SODIUM CHLORIDE 0.9 % IV BOLUS (SEPSIS)
1000.0000 mL | Freq: Once | INTRAVENOUS | Status: AC
  Administered 2013-03-06: 1000 mL via INTRAVENOUS

## 2013-03-07 MED ORDER — COLLAGENASE 250 UNIT/GM EX OINT
TOPICAL_OINTMENT | Freq: Every day | CUTANEOUS | Status: DC
  Administered 2013-03-07: 16:00:00 via TOPICAL
  Administered 2013-03-08: 1 via TOPICAL
  Administered 2013-03-09: 12:00:00 via TOPICAL
  Filled 2013-03-07: qty 30

## 2013-03-07 MED ORDER — ADULT MULTIVITAMIN W/MINERALS CH
1.0000 | ORAL_TABLET | Freq: Every day | ORAL | Status: DC
  Administered 2013-03-09 – 2013-03-11 (×3): 1 via ORAL
  Filled 2013-03-07 (×5): qty 1

## 2013-03-07 MED ORDER — PIPERACILLIN-TAZOBACTAM 3.375 G IVPB
3.3750 g | Freq: Three times a day (TID) | INTRAVENOUS | Status: DC
  Administered 2013-03-07 – 2013-03-10 (×10): 3.375 g via INTRAVENOUS
  Filled 2013-03-07 (×13): qty 50

## 2013-03-07 MED ORDER — LIDOCAINE HCL 1 % IJ SOLN
INTRAMUSCULAR | Status: AC
  Filled 2013-03-07: qty 10

## 2013-03-07 MED ORDER — DOCUSATE SODIUM 100 MG PO CAPS
100.0000 mg | ORAL_CAPSULE | Freq: Two times a day (BID) | ORAL | Status: DC | PRN
  Filled 2013-03-07: qty 1

## 2013-03-07 MED ORDER — GLUCERNA SHAKE PO LIQD
237.0000 mL | ORAL | Status: DC
  Administered 2013-03-09 – 2013-03-11 (×3): 237 mL via ORAL

## 2013-03-07 MED ORDER — SODIUM CHLORIDE 0.9 % IV BOLUS (SEPSIS)
1000.0000 mL | Freq: Once | INTRAVENOUS | Status: AC
  Administered 2013-03-07: 1000 mL via INTRAVENOUS

## 2013-03-07 MED ORDER — MORPHINE SULFATE 4 MG/ML IJ SOLN
4.0000 mg | Freq: Once | INTRAMUSCULAR | Status: AC
  Administered 2013-03-07: 4 mg via INTRAVENOUS
  Filled 2013-03-07: qty 1

## 2013-03-07 MED ORDER — IOHEXOL 300 MG/ML  SOLN: 25.0000 mL | Freq: Once | INTRAMUSCULAR | Status: AC | PRN

## 2013-03-07 MED ORDER — ALBUTEROL SULFATE (2.5 MG/3ML) 0.083% IN NEBU
2.5000 mg | INHALATION_SOLUTION | RESPIRATORY_TRACT | Status: DC | PRN
  Administered 2013-03-07: 2.5 mg via RESPIRATORY_TRACT
  Filled 2013-03-07: qty 3

## 2013-03-07 MED ORDER — FENTANYL CITRATE 0.05 MG/ML IJ SOLN
INTRAMUSCULAR | Status: AC
  Filled 2013-03-07: qty 4

## 2013-03-07 MED ORDER — FOLIC ACID 1 MG PO TABS
1.0000 mg | ORAL_TABLET | Freq: Every day | ORAL | Status: DC
  Administered 2013-03-09 – 2013-03-11 (×3): 1 mg via ORAL
  Filled 2013-03-07 (×5): qty 1

## 2013-03-07 MED ORDER — LIDOCAINE HCL 2 % EX GEL
Freq: Once | CUTANEOUS | Status: AC
  Administered 2013-03-07: 1 via URETHRAL
  Filled 2013-03-07: qty 20

## 2013-03-07 MED ORDER — ONDANSETRON HCL 4 MG PO TABS: 4.0000 mg | ORAL_TABLET | Freq: Four times a day (QID) | ORAL | Status: DC | PRN

## 2013-03-07 MED ORDER — VANCOMYCIN HCL 10 G IV SOLR
1250.0000 mg | INTRAVENOUS | Status: DC
  Administered 2013-03-07 – 2013-03-10 (×4): 1250 mg via INTRAVENOUS
  Filled 2013-03-07 (×4): qty 1250

## 2013-03-07 MED ORDER — IOHEXOL 300 MG/ML  SOLN
100.0000 mL | Freq: Once | INTRAMUSCULAR | Status: AC | PRN
  Administered 2013-03-07: 100 mL via INTRAVENOUS

## 2013-03-07 MED ORDER — OXYCODONE HCL 5 MG PO TABS: 5.0000 mg | ORAL_TABLET | ORAL | Status: DC | PRN

## 2013-03-07 MED ORDER — FENTANYL CITRATE 0.05 MG/ML IJ SOLN
INTRAMUSCULAR | Status: AC | PRN
  Administered 2013-03-07: 25 ug via INTRAVENOUS

## 2013-03-07 MED ORDER — ACETAMINOPHEN 650 MG RE SUPP: 650.0000 mg | Freq: Four times a day (QID) | RECTAL | Status: DC | PRN

## 2013-03-07 MED ORDER — MIDAZOLAM HCL 2 MG/2ML IJ SOLN
INTRAMUSCULAR | Status: AC | PRN
  Administered 2013-03-07: 1 mg via INTRAVENOUS

## 2013-03-07 MED ORDER — SODIUM CHLORIDE 0.9 % IJ SOLN
10.0000 mL | Freq: Two times a day (BID) | INTRAMUSCULAR | Status: DC
  Administered 2013-03-07 – 2013-03-11 (×4): 10 mL via INTRAVENOUS

## 2013-03-07 MED ORDER — ACETAMINOPHEN 325 MG PO TABS: 650.0000 mg | ORAL_TABLET | Freq: Four times a day (QID) | ORAL | Status: DC | PRN

## 2013-03-07 MED ORDER — PIPERACILLIN-TAZOBACTAM 3.375 G IVPB
3.3750 g | Freq: Once | INTRAVENOUS | Status: AC
  Administered 2013-03-07: 3.375 g via INTRAVENOUS
  Filled 2013-03-07: qty 50

## 2013-03-07 MED ORDER — ONDANSETRON HCL 4 MG/2ML IJ SOLN: 4.0000 mg | Freq: Four times a day (QID) | INTRAMUSCULAR | Status: DC | PRN

## 2013-03-07 MED ORDER — MIDAZOLAM HCL 2 MG/2ML IJ SOLN
INTRAMUSCULAR | Status: AC
  Filled 2013-03-07: qty 4

## 2013-03-07 MED ORDER — SODIUM CHLORIDE 0.9 % IV SOLN
INTRAVENOUS | Status: DC
  Administered 2013-03-07 – 2013-03-11 (×3): via INTRAVENOUS

## 2013-03-07 MED ORDER — HYDROMORPHONE HCL PF 1 MG/ML IJ SOLN: 0.5000 mg | INTRAMUSCULAR | Status: DC | PRN

## 2013-03-07 MED ORDER — POLYSACCHARIDE IRON COMPLEX 150 MG PO CAPS
150.0000 mg | ORAL_CAPSULE | Freq: Every day | ORAL | Status: DC
  Administered 2013-03-09 – 2013-03-11 (×3): 150 mg via ORAL
  Filled 2013-03-07 (×5): qty 1

## 2013-03-07 MED ORDER — VITAMIN B-1 100 MG PO TABS
100.0000 mg | ORAL_TABLET | Freq: Every day | ORAL | Status: DC
  Administered 2013-03-09 – 2013-03-11 (×3): 100 mg via ORAL
  Filled 2013-03-07 (×5): qty 1

## 2013-03-07 NOTE — ED Provider Notes (Addendum)
CSN: [id number]     Arrival date & time 03/06/13  2328 History   First MD Initiated Contact with Patient 03/06/13 2332     Chief Complaint  Patient presents with  . Fever  . Altered Mental Status     (Consider location/radiation/quality/duration/timing/severity/associated sxs/prior Treatment) HPI 68 year old male presents emergency department from his nursing facility with complaint of fever and confusion with some lethargy.  Symptoms reportedly started tonight.  Patient has received 2 Vicodin, and after additional orders were requested from facility is Dr. for fever control, it was instructed to send him to the ER.  Patient has complicated recent history of necrotizing fasciitis after TURP on 2/13.  Patient had retroperitoneal abscess, with laparotomy on the 18th.  He has a open abdominal wound is healing by secondary intention.  He also has a sacral ulcer.  Patient has dementia, he reports that he has cough, but denies any other complaints.  Per EMS, nursing home, feels that he's been having abdominal pain.  Patient currently receiving Flagyl and Rocephin for positive for Escherichia coli blood cultures through his PICC line. Past Medical History  Diagnosis Date  . Hypertension   . Stroke     Dementia, otherwise, no residual  . Bladder mass 02-10-13    surgery planned for this  . 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 COPD (chronic obstructive pulmonary disease)     previous history and current smoking  . 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 Dementia 02/18/2013   Past Surgical History  Procedure Laterality Date  . Transurethral resection of bladder tumor with gyrus (turbt-gyrus) N/A 02/13/2013    Procedure: TRANSURETHRAL RESECTION OF BLADDER TUMOR WITH GYRUS (TURBT-GYRUS), bladder biopsies;  Surgeon: Ardis Hughs, MD;  Location: [facility];  Service: Urology;  Laterality: N/A;  . Cystoscopy/retrograde/ureteroscopy Bilateral 02/13/2013    Procedure: BILATERAL  RETROGRADE;  Surgeon: Ardis Hughs, MD;  Location: [facility];  Service: Urology;  Laterality: Bilateral;  . Laparotomy N/A 02/18/2013    Procedure: EXPLORATORY LAPAROTOMY drainage of retroperitoneal abscess, drainage of Pre-Peritoneal abscess and Exploration of bladder perforation;  Surgeon: Imogene Burn. Georgette Dover, MD;  Location: [place];  Service: General;  Laterality: N/A;   No family history on file. History  Substance Use Topics  . Smoking status: Current Every Day Smoker  . Smokeless tobacco: Not on file  . Alcohol Use: Yes    Review of Systems  Unable to perform ROS: Dementia      Allergies  Review of patient's allergies indicates no known allergies.  Home Medications   Current Outpatient Rx  Name  Route  Sig  Dispense  Refill  . acetaminophen (TYLENOL) 325 MG tablet   Oral   Take 2 tablets (650 mg total) by mouth every 6 (six) hours as needed for mild pain (or Fever >/= 101).         . collagenase (SANTYL) ointment   Topical   Apply topically daily. Apply to sacral wound daily.   15 g   0   . dextrose 5 % SOLN 50 mL with cefTRIAXone 1 G SOLR 1 g   Intravenous   Inject 1 g into the vein daily. For 4 weeks from 02/19/13.         Marland Kitchen docusate sodium (COLACE) 100 MG capsule   Oral   Take 1 capsule (100 mg total) by mouth 2 (two) times daily as needed (take to keep stool soft.).   60 capsule   0   .  folic acid (FOLVITE) 1 MG tablet   Oral   Take 1 tablet (1 mg total) by mouth daily.         Marland Kitchen HYDROcodone-acetaminophen (NORCO/VICODIN) 5-325 MG per tablet   Oral   Take 1 tablet by mouth every 6 (six) hours as needed for moderate pain or severe pain.         Marland Kitchen insulin aspart (NOVOLOG) 100 UNIT/ML injection      0-9 Units, Subcutaneous, 3 times daily before meals & bedtime CBG < 70: implement hypoglycemia protocol CBG 70 - 120: 0 units CBG 121 - 150: 1 unit CBG 151 - 200: 2 units CBG 201 - 250: 3 units CBG 251 - 300: 5 units CBG 301 - 350: 7 units CBG 351 -  400: 9 units CBG > 400   10 mL   11   . iron polysaccharides (NIFEREX) 150 MG capsule   Oral   Take 1 capsule (150 mg total) by mouth daily.         . metroNIDAZOLE (FLAGYL) 500 MG tablet   Oral   Take 1 tablet (500 mg total) by mouth every 8 (eight) hours. For 4 weeks from 02/19/13         . Multiple Vitamin (MULTIVITAMIN WITH MINERALS) TABS tablet   Oral   Take 1 tablet by mouth daily.         . predniSONE (DELTASONE) 10 MG tablet      Take 2 tablets (20 mg) daily for 2 days, then, Take 1 tablets (10 mg) daily for 2 days, then, Take 0.5 tablets (5 mg) daily for 1 days, then stop   19 tablet   0   . thiamine 100 MG tablet   Oral   Take 1 tablet (100 mg total) by mouth daily.         . feeding supplement, GLUCERNA SHAKE, (GLUCERNA SHAKE) LIQD   Oral   Take 237 mLs by mouth daily.      0    BP 122/71  Pulse 128  Temp(Src) 103.5 F (39.7 C) (Rectal)  Resp 20  SpO2 96% Physical Exam  Nursing note and vitals reviewed. Constitutional: He appears well-developed and well-nourished.  HENT:  Head: Normocephalic and atraumatic.  Nose: Nose normal.  Mouth/Throat: Oropharynx is clear and moist.  Eyes: Conjunctivae and EOM are normal. Pupils are equal, round, and reactive to light.  Neck: Normal range of motion. Neck supple. No JVD present. No tracheal deviation present. No thyromegaly present.  Cardiovascular: Normal rate, regular rhythm, normal heart sounds and intact distal pulses.  Exam reveals no gallop and no friction rub.   No murmur heard. Tachycardia  Pulmonary/Chest: Effort normal and breath sounds normal. No stridor. No respiratory distress. He has no wheezes. He has no rales. He exhibits no tenderness.  Abdominal: Soft. Bowel sounds are normal. He exhibits no distension and no mass. There is no tenderness. There is no rebound and no guarding.  Patient with abdominal wound healing by secondary intention.  There is no purulence noted.  Base of wound has  good granulation tissue.  There is no surrounding erythema or cellulitis.  Genitourinary:  Patient with large sacral decubitus.  He does have some eschar in place.  The area around rectum is yellowish in color, extremely tender to touch.  Patient had occlusive dressing in place, unfortunately.  Underneath this occlusive dressing was a large amount of feces  Musculoskeletal: Normal range of motion. He exhibits no edema and no tenderness.  Lymphadenopathy:    He has no cervical adenopathy.  Neurological: He is alert. He exhibits normal muscle tone. Coordination normal.  Skin: Skin is warm and dry. No rash noted. No erythema. No pallor.  Psychiatric: He has a normal mood and affect. His behavior is normal. Judgment and thought content normal.    ED Course  BLADDER CATHETERIZATION Date/Time: 03/07/2013 6:57 AM Performed by: Kalman Drape Authorized by: Kalman Drape Consent: Verbal consent not obtained. written consent not obtained. Indications: urethral obstruction Local anesthesia used: yes Local anesthetic: lidocaine/prilocaine emulsion Patient sedated: no Preparation: Patient was prepped and draped in the usual sterile fashion. Catheter insertion: indwelling Catheter type: coude Catheter size: 16 Fr Complicated insertion: no Altered anatomy: no Bladder irrigation: no Number of attempts: 1 Urine volume: 500 ml Urine characteristics: foul-smelling, dark and cloudy Patient tolerance: Patient tolerated the procedure well with no immediate complications. Comments: Patient was noted on CT scan to have insufflated catheter bulb in his urethra.  Attempted to deflate the Foley catheter, and advanced, but this was not possible.  Catheter was removed, coud catheter, introduced without difficulty.  Patient had brisk return of a large amount of foul urine.  Foley bulb inflated.   (including critical care time)  CRITICAL CARE Performed by: Kalman Drape Total critical care time: 75  min Critical care time was exclusive of separately billable procedures and treating other patients. Critical care was necessary to treat or prevent imminent or life-threatening deterioration. Critical care was time spent personally by me on the following activities: development of treatment plan with patient and/or surrogate as well as nursing, discussions with consultants, evaluation of patient's response to treatment, examination of patient, obtaining history from patient or surrogate, ordering and performing treatments and interventions, ordering and review of laboratory studies, ordering and review of radiographic studies, pulse oximetry and re-evaluation of patient's condition.  Labs Review Labs Reviewed  CBC WITH DIFFERENTIAL - Abnormal; Notable for the following:    WBC 27.4 (*)    Hemoglobin 12.1 (*)    HCT 34.9 (*)    MCV 70.2 (*)    MCH 24.3 (*)    Platelets 146 (*)    Neutrophils Relative % 95 (*)    Neutro Abs 26.0 (*)    Lymphocytes Relative 2 (*)    Lymphs Abs 0.6 (*)    All other components within normal limits  COMPREHENSIVE METABOLIC PANEL - Abnormal; Notable for the following:    Creatinine, Ser 1.72 (*)    Calcium 8.3 (*)    Albumin 2.4 (*)    GFR calc non Af Amer 39 (*)    GFR calc Af Amer 45 (*)    All other components within normal limits  I-STAT CG4 LACTIC ACID, ED - Abnormal; Notable for the following:    Lactic Acid, Venous 3.48 (*)    All other components within normal limits  CULTURE, BLOOD (ROUTINE X 2)  CULTURE, BLOOD (ROUTINE X 2)  URINE CULTURE  URINALYSIS, ROUTINE W REFLEX MICROSCOPIC   Imaging Review Dg Chest 2 View  03/07/2013   CLINICAL DATA:  Sepsis, fever  EXAM: CHEST  2 VIEW  COMPARISON:  Prior radiograph from 02/22/2013  FINDINGS: Tip of a left PICC catheter overlies the cavoatrial junction. The cardiac and mediastinal silhouettes are stable in size and contour, and remain within normal limits.  COPD changes again noted. There is patchy  opacity within the retrocardiac left lower lobe, which may reflect atelectasis or possibly infiltrate. There is mild subsegmental  atelectasis within the bilateral lung bases. No airspace consolidation, pleural effusion, or pulmonary edema is identified. There is no pneumothorax.  No acute osseous abnormality identified.  IMPRESSION: 1. Patchy opacity within the retrocardiac left lower lobe, which may reflect atelectasis or infiltrate. 2. COPD.   Electronically Signed   By: Jeannine Boga M.D.   On: 03/07/2013 01:29   Ct Abdomen Pelvis W Contrast  03/07/2013   CLINICAL DATA:  Fever and altered mental status. Recent necrotizing fasciitis. Sacral decubitus ulcer.  EXAM: CT ABDOMEN AND PELVIS WITH CONTRAST  TECHNIQUE: Multidetector CT imaging of the abdomen and pelvis was performed using the standard protocol following bolus administration of intravenous contrast.  CONTRAST:  195mL OMNIPAQUE IOHEXOL 300 MG/ML  SOLN  COMPARISON:  02/18/2013  FINDINGS: BODY WALL: Unremarkable.  LOWER CHEST: Partial clearing of bibasilar pneumonia or aspiration pneumonitis. Coronary atherosclerosis.  ABDOMEN/PELVIS:  Liver: No focal abnormality.  Biliary: No evidence of biliary obstruction or stone.  Pancreas: Unremarkable.  Spleen: Unremarkable.  Adrenals: Unremarkable.  Kidneys and ureters: Mild bilateral hydronephrosis related to mass effect in the pelvis. Symmetric renal enhancement.  Bladder: Gas in the bladder which is usually from instrumentation. Bladder is circumferentially thick walled and hyper enhancing. The mucosa is interrupted are on the left, likely communicating with the pelvic abscess.  Reproductive: There is in the elongated water density collection in the region of the posterior penile urethra measuring 5 cm in length by 2.4 cm in width. This is incompletely imaged. Tissue extending into the lower bladder lumen is likely hypertrophied prostate, similar to 2008 imaging.  Bowel: No obstruction. Appendix not  clearly visualized. No pericecal inflammation. Colonic diverticulosis.  Retroperitoneum: In the region of previously seen gas-forming collection, there is now a predominately fluid collection (although also containing gas) in the extraperitoneal space of the lower abdomen and pelvis. The collection extends from the patient's incision (which is open at the level of the skin) inferiorly into the left, along the left pelvic sidewall. The collection measures up to 11 cm in AP dimension and 8 cm in width. There is thickening of the lower left rectus abdominis which could represent postoperative hemorrhage or inflammatory edema. The fascia around the rectum is circumferentially thickened.  Peritoneum: No free fluid or gas.  Vascular: No acute abnormality.  OSSEOUS: Subchondral erosions in the mid and lower lumbar spine or chronic and smooth, consider degenerative rather than infectious.  These results were called by telephone at the time of interpretation on 03/07/2013 at 225 AM to Dr. Linton Flemings , who verbally acknowledged these results.  IMPRESSION: 1. Recurrent or persistent abdominopelvic abscess, measuring up to 11 x 8 x 7 cm. The abscess communicates with the bladder lumen or bladder wall on the left (presumably the site of recent tumor resection). 2. Low-density expansion of the posterior penile urethra, likely malpositioned Foley catheter with inflated balloon. Correlate with exam. 3. Bilateral mild hydronephrosis from bladder distention or mass effect in the pelvis.   Electronically Signed   By: Jorje Guild M.D.   On: 03/07/2013 02:25     EKG Interpretation None      Date: 03/06/2013  Rate: 129  Rhythm: sinus tachycardia  QRS Axis: normal  Intervals: QT prolonged  ST/T Wave abnormalities: nonspecific ST changes  Conduction Disutrbances:none  Narrative Interpretation:   Old EKG Reviewed: changes noted   MDM   Final diagnoses:  Sepsis  UTI (lower urinary tract infection)  Acute renal  failure  Abdominal abscess    68 year old male  with concern for sepsis.  Patient is febrile, tachycardic, elevated lactate.  He has multiple sources for possible infection.  Blood pressure is stable at this time.  He is receiving IV fluids.  Plan for broad-based antibiotics.  Plan for chest x-ray, and abdominal CT scan given recent history of retroperitoneal abscess.  He will need admission  The patient noted to have re-collection of fluid just deep to his abdominal wound.  Discussed with Dr. Redmond Pulling from surgery who feels that this is more a urologic issue then sternal surgery.  As patient has complicated course, plan to admit to the hospitalist to step down.  I spoke with Dr. Louis Meckel with urology, who will see the patient, as well.  Both urology and surgery recommended IR drainage placement.  Patient noted to have obstruction of his catheter, has had similar problems in the past.  Given his history of dementia, he tends to pull out his Foley.  Foley bulb noted to be in the urethra on CT scan.  Foley catheter was replaced without significant difficulty.  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  He has been started on Zosyn and vancomycin.  Blood and urine cultures have been sent.  He has responded well to IV fluids.  His heart rate has improved.  Kalman Drape, MD 03/07/13 [id number]  Kalman Drape, MD 03/07/13 [phone]

## 2013-03-07 NOTE — ED Notes (Signed)
No changes, VSS, alert, interactive, NAD, calm, restless d/t sacral pain, pt to CT/xray.

## 2013-03-07 NOTE — ED Notes (Addendum)
Open abd incision, gaping open in the middle, wet, serosanguinous, oozing ~ 10cm x ~4cm.

## 2013-03-07 NOTE — H&P (Signed)
Agree.  For left pelvic abscess drainage today under CT guidance.  Consent obtained from the patient's daughter secondary to dementia.

## 2013-03-07 NOTE — ED Notes (Addendum)
duoderm-like sacral dressing removed by Dr. Sharol Given. 6.5cm x 9cm sacral wound noted, present PTA. Full thickness deep tissue injury, yellow slough and black eschar noted.

## 2013-03-07 NOTE — Progress Notes (Signed)
ANTIBIOTIC CONSULT NOTE - INITIAL  Pharmacy Consult for Vancomycin/Zosyn  Indication: rule out sepsis  No Known Allergies  Patient Measurements: ~65 kg  Vital Signs: Temp: 103.5 F (39.7 C) 03-21-22 2341) Temp src: Rectal 03/21/2022 2341) BP: 122/71 mmHg (03/07 0030) Pulse Rate: 128 (03/07 0030) Intake/Output from previous day: 03/21/22 0701 - 03/07 0700 In: 2325 [P.O.:325; I.V.:2000] Out: -  Intake/Output from this shift: Total I/O In: 2325 [P.O.:325; I.V.:2000] Out: -   Labs:  Recent Labs  03/06/13 2335  WBC 27.4*  HGB 12.1*  PLT 146*  CREATININE 1.72*    Medical History: Past Medical History  Diagnosis Date  . Hypertension   . Stroke     Dementia, otherwise, no residual  . Bladder mass 02-10-13    surgery planned for this  . 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 COPD (chronic obstructive pulmonary disease)     previous history and current smoking  . 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 Dementia 02/18/2013   Assessment: 68 y/o M from [place] with fever, AMS, pt with abdominal wounds/sacral ulcer, on ceftriaxone 1g IV daily at [facility], to broaden coverage to vancomycin/zosyn, WBC 27.4, Scr 1.72, other labs as above.   Goal of Therapy:  Vancomycin trough level 15-20 mcg/ml  Plan:  -Vancomycin 1250 mg IV q24h -Zosyn 3.375G IV q8h to be infused over 4 hours -Trend WBC, temp, renal function  -Drug levels as indicated   Narda Bonds 03/07/2013,12:59 AM

## 2013-03-07 NOTE — H&P (Signed)
Referring Physician: Dr. Louis Meckel HPI: Jason [name] is an 68 y.o. male who has dementia and answers with one word phrases. He denies any pain, fever, or chills. History is obtained per chart review and from daughter over the phone. The patient has a history of bladder cancer and s/p TURBT 1/76/16 complicated by bladder perforation. The patient previously had a foley catheter in place and this was removed himself. He also postoperative had pelvic drains in place and the left lower drain was also removed by the patient prematurely. He was discharged to a SNF and had follow-up cystogram which showed persistent bladder leak, the foley was exchanged and the patient was to return at a later date. He started having fevers last evening and was brought to the ED and CT imaging reveals left pelvic fluid collection, labs reveal leukocytosis. IR received request for image guided drain placement.   Past Medical History:  Past Medical History  Diagnosis Date  . Hypertension   . Stroke     Dementia, otherwise, no residual  . Bladder mass 02-10-13    surgery planned for this  . 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 COPD (chronic obstructive pulmonary disease)     previous history and current smoking  . 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 Dementia 02/18/2013    Past Surgical History:  Past Surgical History  Procedure Laterality Date  . Transurethral resection of bladder tumor with gyrus (turbt-gyrus) N/A 02/13/2013    Procedure: TRANSURETHRAL RESECTION OF BLADDER TUMOR WITH GYRUS (TURBT-GYRUS), bladder biopsies;  Surgeon: Ardis Hughs, MD;  Location: [facility];  Service: Urology;  Laterality: N/A;  . Cystoscopy/retrograde/ureteroscopy Bilateral 02/13/2013    Procedure: BILATERAL RETROGRADE;  Surgeon: Ardis Hughs, MD;  Location: [facility];  Service: Urology;  Laterality: Bilateral;  . Laparotomy N/A 02/18/2013    Procedure: EXPLORATORY LAPAROTOMY drainage of retroperitoneal  abscess, drainage of Pre-Peritoneal abscess and Exploration of bladder perforation;  Surgeon: Imogene Burn. Georgette Dover, MD;  Location: [place] [facility];  Service: General;  Laterality: N/A;    Family History: No family history on file.  Social History:  reports that he has been smoking.  He does not have any smokeless tobacco history on file. He reports that he drinks alcohol. He reports that he does not use illicit drugs.  Allergies: No Known Allergies  Medications:   Medication List    ASK your doctor about these medications       acetaminophen 325 MG tablet  Commonly known as:  TYLENOL  Take 2 tablets (650 mg total) by mouth every 6 (six) hours as needed for mild pain (or Fever >/= 101).     collagenase ointment  Commonly known as:  SANTYL  Apply topically daily. Apply to sacral wound daily.     dextrose 5 % SOLN 50 mL with cefTRIAXone 1 G SOLR 1 g  Inject 1 g into the vein daily. For 4 weeks from 02/19/13.     docusate sodium 100 MG capsule  Commonly known as:  COLACE  Take 1 capsule (100 mg total) by mouth 2 (two) times daily as needed (take to keep stool soft.).     feeding supplement (GLUCERNA SHAKE) Liqd  Take 237 mLs by mouth daily.     folic acid 1 MG tablet  Commonly known as:  FOLVITE  Take 1 tablet (1 mg total) by mouth daily.     HYDROcodone-acetaminophen 5-325 MG per tablet  Commonly known as:  NORCO/VICODIN  Take 1 tablet  by mouth every 6 (six) hours as needed for moderate pain or severe pain.     insulin aspart 100 UNIT/ML injection  Commonly known as:  novoLOG  - 0-9 Units, Subcutaneous, 3 times daily before meals & bedtime  - CBG < 70: implement hypoglycemia protocol  - CBG 70 - 120: 0 units  - CBG 121 - 150: 1 unit  - CBG 151 - 200: 2 units  - CBG 201 - 250: 3 units  - CBG 251 - 300: 5 units  - CBG 301 - 350: 7 units  - CBG 351 - 400: 9 units  - CBG > 400     iron polysaccharides 150 MG capsule  Commonly known as:  NIFEREX  Take 1 capsule (150 mg  total) by mouth daily.     metroNIDAZOLE 500 MG tablet  Commonly known as:  FLAGYL  Take 1 tablet (500 mg total) by mouth every 8 (eight) hours. For 4 weeks from 02/19/13     multivitamin with minerals Tabs tablet  Take 1 tablet by mouth daily.     predniSONE 10 MG tablet  Commonly known as:  DELTASONE  - Take 2 tablets (20 mg) daily for 2 days, then,  - Take 1 tablets (10 mg) daily for 2 days, then,  - Take 0.5 tablets (5 mg) daily for 1 days, then stop     thiamine 100 MG tablet  Take 1 tablet (100 mg total) by mouth daily.        Please HPI for pertinent positives, otherwise complete 10 system ROS negative.  Physical Exam: BP 96/51  Pulse 92  Temp(Src) 98.7 F (37.1 C) (Oral)  Resp 14  Ht 6' (1.829 m)  Wt 145 lb 11.6 oz (66.1 kg)  BMI 19.76 kg/m2  SpO2 91% Body mass index is 19.76 kg/(m^2).   General Appearance:  Alert, cooperative, no distress  Head:  Normocephalic, without obvious abnormality, atraumatic  Neck: Supple, symmetrical, trachea midline  Lungs:   Clear to auscultation bilaterally, no w/r/r, respirations unlabored without use of accessory muscles.  Chest Wall:  No tenderness or deformity  Heart:  Regular rate and rhythm, S1, S2 normal, no murmur, rub or gallop.  Back: Sacral ulcer present, dressing intact.  Abdomen:   Soft, midline incision dressing C/D/I. Tenderness to LLQ with palpation, non distended, (+) BS  Extremities: Extremities normal, atraumatic, no cyanosis or edema  Neurologic: Flat affect, one word responses.    Results for orders placed during the hospital encounter of 03/06/13 (from the past 48 hour(s))  CBC WITH DIFFERENTIAL     Status: Abnormal   Collection Time    03/06/13 11:35 PM      Result Value Ref Range   WBC 27.4 (*) 4.0 - 10.5 K/uL   RBC 4.97  4.22 - 5.81 MIL/uL   Hemoglobin 12.1 (*) 13.0 - 17.0 g/dL   HCT 34.9 (*) 39.0 - 52.0 %   MCV 70.2 (*) 78.0 - 100.0 fL   MCH 24.3 (*) 26.0 - 34.0 pg   MCHC 34.7  30.0 - 36.0 g/dL    RDW 15.2  11.5 - 15.5 %   Platelets 146 (*) 150 - 400 K/uL   Neutrophils Relative % 95 (*) 43 - 77 %   Neutro Abs 26.0 (*) 1.7 - 7.7 K/uL   Lymphocytes Relative 2 (*) 12 - 46 %   Lymphs Abs 0.6 (*) 0.7 - 4.0 K/uL   Monocytes Relative 3  3 - 12 %  Monocytes Absolute 0.8  0.1 - 1.0 K/uL   Eosinophils Relative 0  0 - 5 %   Eosinophils Absolute 0.0  0.0 - 0.7 K/uL   Basophils Relative 0  0 - 1 %   Basophils Absolute 0.0  0.0 - 0.1 K/uL  COMPREHENSIVE METABOLIC PANEL     Status: Abnormal   Collection Time    03/06/13 11:35 PM      Result Value Ref Range   Sodium 139  137 - 147 mEq/L   Potassium 4.5  3.7 - 5.3 mEq/L   Chloride 101  96 - 112 mEq/L   CO2 22  19 - 32 mEq/L   Glucose, Bld 98  70 - 99 mg/dL   BUN 22  6 - 23 mg/dL   Creatinine, Ser 1.72 (*) 0.50 - 1.35 mg/dL   Calcium 8.3 (*) 8.4 - 10.5 mg/dL   Total Protein 6.4  6.0 - 8.3 g/dL   Albumin 2.4 (*) 3.5 - 5.2 g/dL   AST 21  0 - 37 U/L   ALT 25  0 - 53 U/L   Alkaline Phosphatase 102  39 - 117 U/L   Total Bilirubin 0.7  0.3 - 1.2 mg/dL   GFR calc non Af Amer 39 (*) >90 mL/min   GFR calc Af Amer 45 (*) >90 mL/min   Comment: (NOTE)     The eGFR has been calculated using the CKD EPI equation.     This calculation has not been validated in all clinical situations.     eGFR's persistently <90 mL/min signify possible Chronic Kidney     Disease.  I-STAT CG4 LACTIC ACID, ED     Status: Abnormal   Collection Time    03/06/13 11:54 PM      Result Value Ref Range   Lactic Acid, Venous 3.48 (*) 0.5 - 2.2 mmol/L  URINALYSIS, ROUTINE W REFLEX MICROSCOPIC     Status: Abnormal   Collection Time    03/07/13  2:22 AM      Result Value Ref Range   Color, Urine RED (*) YELLOW   Comment: BIOCHEMICALS MAY BE AFFECTED BY COLOR   APPearance TURBID (*) CLEAR   Specific Gravity, Urine 1.023  1.005 - 1.030   pH 6.0  5.0 - 8.0   Glucose, UA NEGATIVE  NEGATIVE mg/dL   Hgb urine dipstick LARGE (*) NEGATIVE   Bilirubin Urine NEGATIVE   NEGATIVE   Ketones, ur 15 (*) NEGATIVE mg/dL   Protein, ur 100 (*) NEGATIVE mg/dL   Urobilinogen, UA 0.2  0.0 - 1.0 mg/dL   Nitrite NEGATIVE  NEGATIVE   Leukocytes, UA LARGE (*) NEGATIVE  URINE MICROSCOPIC-ADD ON     Status: Abnormal   Collection Time    03/07/13  2:22 AM      Result Value Ref Range   Squamous Epithelial / LPF RARE  RARE   WBC, UA TOO NUMEROUS TO COUNT  <3 WBC/hpf   RBC / HPF TOO NUMEROUS TO COUNT  <3 RBC/hpf   Bacteria, UA MANY (*) RARE  GLUCOSE, CAPILLARY     Status: None   Collection Time    03/07/13  5:09 AM      Result Value Ref Range   Glucose-Capillary 96  70 - 99 mg/dL  BASIC METABOLIC PANEL     Status: Abnormal   Collection Time    03/07/13  5:55 AM      Result Value Ref Range   Sodium 139  137 - 147  mEq/L   Potassium 4.3  3.7 - 5.3 mEq/L   Chloride 106  96 - 112 mEq/L   CO2 22  19 - 32 mEq/L   Glucose, Bld 101 (*) 70 - 99 mg/dL   BUN 17  6 - 23 mg/dL   Creatinine, Ser 1.71 (*) 0.50 - 1.35 mg/dL   Calcium 7.6 (*) 8.4 - 10.5 mg/dL   GFR calc non Af Amer 39 (*) >90 mL/min   GFR calc Af Amer 45 (*) >90 mL/min   Comment: (NOTE)     The eGFR has been calculated using the CKD EPI equation.     This calculation has not been validated in all clinical situations.     eGFR's persistently <90 mL/min signify possible Chronic Kidney     Disease.  CBC     Status: Abnormal   Collection Time    03/07/13  5:55 AM      Result Value Ref Range   WBC 31.9 (*) 4.0 - 10.5 K/uL   RBC 4.84  4.22 - 5.81 MIL/uL   Hemoglobin 11.5 (*) 13.0 - 17.0 g/dL   HCT 34.4 (*) 39.0 - 52.0 %   MCV 71.1 (*) 78.0 - 100.0 fL   MCH 23.8 (*) 26.0 - 34.0 pg   MCHC 33.4  30.0 - 36.0 g/dL   RDW 15.7 (*) 11.5 - 15.5 %   Platelets 119 (*) 150 - 400 K/uL   Comment: REPEATED TO VERIFY     PLATELET COUNT CONFIRMED BY SMEAR   Dg Chest 2 View  03/07/2013   CLINICAL DATA:  Sepsis, fever  EXAM: CHEST  2 VIEW  COMPARISON:  Prior radiograph from 02/22/2013  FINDINGS: Tip of a left PICC catheter  overlies the cavoatrial junction. The cardiac and mediastinal silhouettes are stable in size and contour, and remain within normal limits.  COPD changes again noted. There is patchy opacity within the retrocardiac left lower lobe, which may reflect atelectasis or possibly infiltrate. There is mild subsegmental atelectasis within the bilateral lung bases. No airspace consolidation, pleural effusion, or pulmonary edema is identified. There is no pneumothorax.  No acute osseous abnormality identified.  IMPRESSION: 1. Patchy opacity within the retrocardiac left lower lobe, which may reflect atelectasis or infiltrate. 2. COPD.   Electronically Signed   By: Jeannine Boga M.D.   On: 03/07/2013 01:29   Ct Abdomen Pelvis W Contrast  03/07/2013   CLINICAL DATA:  Fever and altered mental status. Recent necrotizing fasciitis. Sacral decubitus ulcer.  EXAM: CT ABDOMEN AND PELVIS WITH CONTRAST  TECHNIQUE: Multidetector CT imaging of the abdomen and pelvis was performed using the standard protocol following bolus administration of intravenous contrast.  CONTRAST:  156mL OMNIPAQUE IOHEXOL 300 MG/ML  SOLN  COMPARISON:  02/18/2013  FINDINGS: BODY WALL: Unremarkable.  LOWER CHEST: Partial clearing of bibasilar pneumonia or aspiration pneumonitis. Coronary atherosclerosis.  ABDOMEN/PELVIS:  Liver: No focal abnormality.  Biliary: No evidence of biliary obstruction or stone.  Pancreas: Unremarkable.  Spleen: Unremarkable.  Adrenals: Unremarkable.  Kidneys and ureters: Mild bilateral hydronephrosis related to mass effect in the pelvis. Symmetric renal enhancement.  Bladder: Gas in the bladder which is usually from instrumentation. Bladder is circumferentially thick walled and hyper enhancing. The mucosa is interrupted are on the left, likely communicating with the pelvic abscess.  Reproductive: There is in the elongated water density collection in the region of the posterior penile urethra measuring 5 cm in length by 2.4 cm in  width. This is incompletely imaged. Tissue  extending into the lower bladder lumen is likely hypertrophied prostate, similar to 2008 imaging.  Bowel: No obstruction. Appendix not clearly visualized. No pericecal inflammation. Colonic diverticulosis.  Retroperitoneum: In the region of previously seen gas-forming collection, there is now a predominately fluid collection (although also containing gas) in the extraperitoneal space of the lower abdomen and pelvis. The collection extends from the patient's incision (which is open at the level of the skin) inferiorly into the left, along the left pelvic sidewall. The collection measures up to 11 cm in AP dimension and 8 cm in width. There is thickening of the lower left rectus abdominis which could represent postoperative hemorrhage or inflammatory edema. The fascia around the rectum is circumferentially thickened.  Peritoneum: No free fluid or gas.  Vascular: No acute abnormality.  OSSEOUS: Subchondral erosions in the mid and lower lumbar spine or chronic and smooth, consider degenerative rather than infectious.  These results were called by telephone at the time of interpretation on 03/07/2013 at 225 AM to Dr. Linton Flemings , who verbally acknowledged these results.  IMPRESSION: 1. Recurrent or persistent abdominopelvic abscess, measuring up to 11 x 8 x 7 cm. The abscess communicates with the bladder lumen or bladder wall on the left (presumably the site of recent tumor resection). 2. Low-density expansion of the posterior penile urethra, likely malpositioned Foley catheter with inflated balloon. Correlate with exam. 3. Bilateral mild hydronephrosis from bladder distention or mass effect in the pelvis.   Electronically Signed   By: Jorje Guild M.D.   On: 03/07/2013 02:25    Assessment/Plan Bladder cancer s/p TURBT 1/88/41, complicated by bladder perforation. Left lower pelvic abscess communicating with bladder lumen or bladder wall, CT 03/07/13, request for image guided  drain placement. Leukocytosis, 31.9 on antibiotics, afebrile. Sacral decubitus ulcer, wound care nurse following. Patient has been NPO, labs reviewed, no blood thinners. Risks and Benefits discussed with the patient and his daughter over the phone. All questions were answered, patient's daughter is agreeable to proceed. Consent signed and in chart.   Tsosie Billing D PA-C 03/07/2013, 12:59 PM

## 2013-03-07 NOTE — ED Notes (Signed)
Dr. Redmond Pulling (surgery) at [facility]

## 2013-03-07 NOTE — Consult Note (Signed)
This is a 68 year old male well-known to urology who we are asked to consult on by Dr. Linton Flemings, M.D. for evaluation and management of pelvic fluid collection.  History of present illness: Jason [name] has a history of bladder cancer and is status post TURBT which was complicated by a bladder perforation. He was discharged with a Foley catheter which was inadvertently removed by himself. He subsequently presented to [facility] emergency department was found to have a necrotizing pelvic infection. He was taken to Jason operating room where he was explored and washed out. Several drains were left. He improved and his drains were slowly being backed out. However, Jason left pelvic drain was pulled out inadvertently by Jason [name] earlier than anticipated. Jason [name] was discharged to a skilled nursing facility on IV antibiotics. 3 days prior to his current presentation to Jason emergency department he underwent a cystogram in Jason urology office. This showed a persistent leak Jason left lateral wall Jason bladder. Jason Foley catheter was changed and Jason [name] was scheduled for followup in 2 weeks.  Jason [name] was brought to Jason emergency department early last evening with a fever. He was noted to have a fever of 102.18F, a white blood cell count of 27,000, and his CT scan revealed a large left-sided pelvic fluid collection/abscess. Jason Foley catheter was noted to be pulled out into Jason urethra, and it was removed and replaced in Jason emergency department. Clear yellow urine was returned. He was admitted to Jason step down unit.  Jason [name] is pleasantly demented, he denies any feelings of increased abdominal pain, fevers, or lethargy, chills. To him, he is doing "all right".  Review of systems: Due to [name]'s dementia I was unable to obtain a complete comprehensive review of systems.  [name] Active Problem List   Diagnosis Date Noted  . Acute encephalopathy 03/07/2013  . Postoperative intra-abdominal abscess  03/07/2013  . Hydronephrosis, bilateral 03/07/2013  . Pressure ulcer, stage GYB(638.93) 03/07/2013  . UTI (lower urinary tract infection) 02/18/2013  . Sepsis 02/18/2013  . Aspiration pneumonia 02/18/2013  . Acute respiratory failure with hypoxia 02/18/2013  . Acute renal failure 02/18/2013  . Dementia 02/18/2013  . Hematuria 02/18/2013  . 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 Memory loss 03/27/2009  . CELLULITIS, HAND, LEFT 03/23/2009  . FATIGUE 03/15/2009  . ALCOHOL ABUSE 01/30/2008  . GOITER 10/25/2006  . NICOTINE ADDICTION 10/25/2006  . HYPERTENSION 10/25/2006  . COLONIC POLYPS, HX OF 10/25/2006  . BENIGN PROSTATIC HYPERTROPHY, HX OF 10/25/2006   No current facility-administered medications on file prior to encounter.   Current Outpatient Prescriptions on File Prior to Encounter  Medication Sig Dispense Refill  . acetaminophen (TYLENOL) 325 MG tablet Take 2 tablets (650 mg total) by mouth every 6 (six) hours as needed for mild pain (or Fever >/= 101).      . collagenase (SANTYL) ointment Apply topically daily. Apply to sacral wound daily.  15 g  0  . dextrose 5 % SOLN 50 mL with cefTRIAXone 1 G SOLR 1 g Inject 1 g into Jason vein daily. For 4 weeks from 02/19/13.      Marland Kitchen docusate sodium (COLACE) 100 MG capsule Take 1 capsule (100 mg total) by mouth 2 (two) times daily as needed (take to keep stool soft.).  60 capsule  0  . folic acid (FOLVITE) 1 MG tablet Take 1 tablet (1 mg total) by mouth daily.      . insulin aspart (NOVOLOG) 100 UNIT/ML injection 0-9 Units, Subcutaneous, 3  times daily before meals & bedtime CBG < 70: implement hypoglycemia protocol CBG 70 - 120: 0 units CBG 121 - 150: 1 unit CBG 151 - 200: 2 units CBG 201 - 250: 3 units CBG 251 - 300: 5 units CBG 301 - 350: 7 units CBG 351 - 400: 9 units CBG > 400  10 mL  11  . iron polysaccharides (NIFEREX) 150 MG capsule Take 1 capsule (150 mg total) by mouth daily.      . metroNIDAZOLE (FLAGYL) 500 MG tablet Take 1 tablet  (500 mg total) by mouth every 8 (eight) hours. For 4 weeks from 02/19/13      . Multiple Vitamin (MULTIVITAMIN WITH MINERALS) TABS tablet Take 1 tablet by mouth daily.      . predniSONE (DELTASONE) 10 MG tablet Take 2 tablets (20 mg) daily for 2 days, then, Take 1 tablets (10 mg) daily for 2 days, then, Take 0.5 tablets (5 mg) daily for 1 days, then stop  19 tablet  0  . thiamine 100 MG tablet Take 1 tablet (100 mg total) by mouth daily.      . feeding supplement, GLUCERNA SHAKE, (GLUCERNA SHAKE) LIQD Take 237 mLs by mouth daily.    0   Past Medical History  Diagnosis Date  . Hypertension   . Stroke     Dementia, otherwise, no residual  . Bladder mass 02-10-13    surgery planned for this  . 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 COPD (chronic obstructive pulmonary disease)     previous history and current smoking  . 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 Dementia 02/18/2013   Past Surgical History  Procedure Laterality Date  . Transurethral resection of bladder tumor with gyrus (turbt-gyrus) N/A 02/13/2013    Procedure: TRANSURETHRAL RESECTION OF BLADDER TUMOR WITH GYRUS (TURBT-GYRUS), bladder biopsies;  Surgeon: Ardis Hughs, MD;  Location: [facility];  Service: Urology;  Laterality: N/A;  . Cystoscopy/retrograde/ureteroscopy Bilateral 02/13/2013    Procedure: BILATERAL RETROGRADE;  Surgeon: Ardis Hughs, MD;  Location: [facility];  Service: Urology;  Laterality: Bilateral;  . Laparotomy N/A 02/18/2013    Procedure: EXPLORATORY LAPAROTOMY drainage of retroperitoneal abscess, drainage of Pre-Peritoneal abscess and Exploration of bladder perforation;  Surgeon: Imogene Burn. Tsuei, MD;  Location: [facility];  Service: General;  Laterality: N/A;   PE: Filed Vitals:   03/07/13 0630 03/07/13 0700 03/07/13 0746 03/07/13 0800  BP: 104/47 95/52  94/45  Pulse:  93  99  Temp:   98.7 F (37.1 C)   TempSrc:   Oral   Resp: 19 15  43  Height:      Weight:      SpO2:  96%  90%     Intake/Output Summary (Last 24 hours) at 03/07/13 0859 Last data filed at 03/07/13 0800  Gross per 24 hour  Intake 4363.75 ml  Output   1625 ml  Net 2738.75 ml   No acute distress, sleeping but arousable HEENT: Atraumatic normocephalic head, injected sclerae, moist mucous membranes CV: Regular rhythm, tachycardia, no peripheral edema Lungs: Coarse breath sounds bilaterally Abdomen: Lower Midline incision is open with some granulation tissue, fascial sutures are exposed, there is no surrounding erythema and no purulence. [name] endorses left lower quadrant/pelvic pain to palpation. He denies any right-sided lower quadrant/pelvic pain. GU: Jason Foley is draining clearly urine is no scrotal edema. MSK: Grossly intact, and x-rays are symmetric without edema Psych: Flat affect, one line/word responses, appears to be  his baseline Skin: Large ~ 6 cm x 4 cm sacral decubitus ulcer with eschar. There is no bone exposure. Jason edges of Jason wound are nonblanching.   Recent Labs  03/06/13 2335 03/07/13 0555  WBC 27.4* 31.9*  HGB 12.1* 11.5*  HCT 34.9* 34.4*    Recent Labs  03/06/13 2335 03/07/13 0555  NA 139 139  K 4.5 4.3  CL 101 106  CO2 22 22  GLUCOSE 98 101*  BUN 22 17  CREATININE 1.72* 1.71*  CALCIUM 8.3* 7.6*   No results found for this basename: LABPT, INR,  in Jason last 72 hours No results found for this basename: LABURIN,  in Jason last 72 hours Results for orders placed during Jason hospital encounter of 02/18/13  URINE CULTURE     Status: None   Collection Time    02/18/13  7:20 AM      Result Value Ref Range Status   Specimen Description URINE, CLEAN CATCH   Final   Special Requests NONE   Final   Culture  Setup Time     Final   Value: 02/18/2013 10:26     Performed at [place]     Final   Value: >=100,000 COLONIES/ML     Performed at [facility]   Culture     Final   Value: ESCHERICHIA COLI     Performed at [facility]   Report Status 02/22/2013 FINAL   Final   Organism ID, Bacteria ESCHERICHIA COLI   Final  CULTURE, BLOOD (ROUTINE X 2)     Status: None   Collection Time    02/18/13  8:15 AM      Result Value Ref Range Status   Specimen Description BLOOD SITE NOT SPECIFIED DRAWN BY RN   Final   Special Requests BOTTLES DRAWN AEROBIC AND ANAEROBIC 4CC   Final   Culture NO GROWTH 5 DAYS   Final   Report Status 02/23/2013 FINAL   Final  CULTURE, BLOOD (ROUTINE X 2)     Status: None   Collection Time    02/18/13  8:30 AM      Result Value Ref Range Status   Specimen Description BLOOD RIGHT ARM   Final   Special Requests BOTTLES DRAWN AEROBIC ONLY [facility]   Final   Culture  Setup Time     Final   Value: 02/20/2013 01:21     Performed at [facility]   Culture     Final   Value: ESCHERICHIA COLI     1822 Note: Gram Stain Report Called to,Read Back By and Verified With: Girard Cooter 02/19/2013 B[place]AM Performed at [facility]     Performed at [facility]   Report Status 02/22/2013 FINAL   Final   Organism ID, Bacteria ESCHERICHIA COLI   Final  MRSA PCR SCREENING     Status: None   Collection Time    02/18/13 10:13 AM      Result Value Ref Range Status   MRSA by PCR NEGATIVE  NEGATIVE Final   Comment:            Jason GeneXpert MRSA Assay (FDA     approved for NASAL specimens     only), is one component of a     comprehensive MRSA colonization     surveillance program. It is not     intended to diagnose MRSA     infection nor to guide  or     monitor treatment for     MRSA infections.  AFB CULTURE WITH SMEAR     Status: None   Collection Time    02/19/13 12:28 AM      Result Value Ref Range Status   Specimen Description ABSCESS ABDOMEN   Final   Special Requests PRE PERITONEAL   Final   ACID FAST SMEAR     Final   Value: NO ACID FAST BACILLI SEEN     Performed at [facility]   Culture     Final   Value: CULTURE WILL BE EXAMINED FOR 6 WEEKS BEFORE  ISSUING A FINAL REPORT     Performed at [facility]   Report Status PENDING   Incomplete  ANAEROBIC CULTURE     Status: None   Collection Time    02/19/13 12:30 AM      Result Value Ref Range Status   Specimen Description ABSCESS ABDOMEN   Final   Special Requests PRE PERITONEAL   Final   Gram Stain     Final   Value: ABUNDANT WBC PRESENT,BOTH PMN AND MONONUCLEAR     NO SQUAMOUS EPITHELIAL CELLS SEEN     MODERATE GRAM NEGATIVE RODS     Performed at [facility]   Culture     Final   Value: NO ANAEROBES ISOLATED     Performed at [facility]   Report Status 02/24/2013 FINAL   Final  CULTURE, ROUTINE-ABSCESS     Status: None   Collection Time    02/19/13 12:31 AM      Result Value Ref Range Status   Specimen Description ABSCESS ABDOMEN   Final   Special Requests PRE PERITONEAL   Final   Gram Stain     Final   Value: ABUNDANT WBC PRESENT,BOTH PMN AND MONONUCLEAR     NO SQUAMOUS EPITHELIAL CELLS SEEN     MODERATE GRAM NEGATIVE RODS     Performed at [facility]   Culture     Final   Value: MODERATE ESCHERICHIA COLI     Performed at [facility]   Report Status 02/21/2013 FINAL   Final   Organism ID, Bacteria ESCHERICHIA COLI   Final  FUNGUS CULTURE W SMEAR     Status: None   Collection Time    02/19/13 12:33 AM      Result Value Ref Range Status   Specimen Description ABSCESS ABDOMEN   Final   Special Requests PRE PERITONEAL   Final   Fungal Smear     Final   Value: NO YEAST OR FUNGAL ELEMENTS SEEN     Performed at [facility]   Culture     Final   Value: CULTURE IN PROGRESS FOR FOUR WEEKS     Performed at [facility]   Report Status PENDING   Incomplete  CULTURE, RESPIRATORY (NON-EXPECTORATED)     Status: None   Collection Time    02/19/13  5:38 AM      Result Value Ref Range Status   Specimen Description TRACHEAL ASPIRATE   Final   Special Requests NONE   Final   Gram Stain     Final   Value: NO WBC SEEN      NO SQUAMOUS EPITHELIAL CELLS SEEN     NO ORGANISMS SEEN     Performed at [facility]   Culture     Final   Value: Non-Pathogenic Oropharyngeal-type Flora  Isolated.     Performed at [facility]   Report Status 02/21/2013 FINAL   Final  CULTURE, RESPIRATORY (NON-EXPECTORATED)     Status: None   Collection Time    02/19/13  9:53 AM      Result Value Ref Range Status   Specimen Description TRACHEAL ASPIRATE   Final   Special Requests NONE   Final   Gram Stain     Final   Value: FEW WBC PRESENT, PREDOMINANTLY PMN     RARE SQUAMOUS EPITHELIAL CELLS PRESENT     RARE GRAM POSITIVE COCCI     IN PAIRS     Performed at [facility]   Culture     Final   Value: Non-Pathogenic Oropharyngeal-type Flora Isolated.     Performed at [facility]   Report Status 02/21/2013 FINAL   Final  CULTURE, BLOOD (ROUTINE X 2)     Status: None   Collection Time    02/24/13  3:00 PM      Result Value Ref Range Status   Specimen Description BLOOD LEFT ARM   Final   Special Requests BOTTLES DRAWN AEROBIC ONLY [place]   Final   Culture  Setup Time     Final   Value: 02/24/2013 22:10     Performed at [facility]   Culture     Final   Value: NO GROWTH 5 DAYS     Performed at [facility]   Report Status 03/02/2013 FINAL   Final  CULTURE, BLOOD (ROUTINE X 2)     Status: None   Collection Time    02/24/13  3:10 PM      Result Value Ref Range Status   Specimen Description BLOOD LEFT HAND   Final   Special Requests BOTTLES DRAWN AEROBIC ONLY [facility]   Final   Culture  Setup Time     Final   Value: 02/24/2013 22:11     Performed at [facility]   Culture     Final   Value: NO GROWTH 5 DAYS     Performed at [facility]   Report Status 03/02/2013 FINAL   Final   CT, ab/pelv: IMPRESSION:  1. Recurrent or persistent abdominopelvic abscess, measuring up to  11 x 8 x 7 cm. Jason abscess communicates with Jason bladder lumen or  bladder wall on Jason left  (presumably Jason site of recent tumor  resection).  2. Low-density expansion of Jason posterior penile urethra, likely  malpositioned Foley catheter with inflated balloon. Correlate with  exam.  3. Bilateral mild hydronephrosis from bladder distention or mass  effect in Jason pelvis.  Imp:  #1 Left-sided infected pelvic fluid collection.  #2 large sacral decubitus ulcer #3 persistent extraperitoneal bladder perforation  Recommendation: #1 I have ordered a drain to be placed in Jason pelvic fluid collection with interventional radiology. I spoke to Jason IR team, they plan to get to this nearly afternoon. I requested that routine micro-biology be sent on Jason specimen. Would consider getting infectious disease involved for antibiotic choice and duration. #2 [name] should be seen and evaluated by Jason wound care nurse regards to his sacral decubitus ulcer precautions she'll be in place to prevent progression. #3 Foley should be placed to gravity drainage.  We will continue to follow.

## 2013-03-07 NOTE — ED Notes (Addendum)
Pending xray & CT, 2nd blood culture obtained from PICC, family x2 at [facility]. Pt alert, NAD, calm, interactive, attempting to re-position self off of sacrum d/t ulcer/pain. "feels better since pain morphine". Foley dry. No urine output at this time.

## 2013-03-07 NOTE — ED Notes (Signed)
Dr. Sharol Given at [facility] placing coude urinary catheter

## 2013-03-07 NOTE — Progress Notes (Signed)
Followup note:  Patient admitted earlier this morning. Seen after transfer to the ICU (no available beds in the step down unit).  Appreciate urology and interventional radiology assistance.  Decubitus ulcer: Wound care consult ordered Sepsis secondary to intra-abdominal abscess in setting of recent TURP: Status post drain by interventional radiology, on antibiotics Hydronephrosis: Secondary to urinary obstruction, Coude catheter placed in emergency room.  Continue step down. Possible transfer to floor tomorrow.

## 2013-03-07 NOTE — ED Notes (Signed)
Pt remains in CT/xray, preparing to re-assign pt to room D33.

## 2013-03-07 NOTE — ED Notes (Signed)
Dr Otter given a copy of lactic acid results 3.48 

## 2013-03-07 NOTE — ED Notes (Signed)
Pt sleeping/ resting, NAD, calm, VSS/improved, no family at [facility]. 4th liter NS infusing.

## 2013-03-07 NOTE — Procedures (Signed)
Procedure:  CT guided catheter drainage of left pelvic abscess Findings:  Aspiration yielded turbid fluid.  Sample sent for culture.  12 Fr drain placed and attached to suction bulb.

## 2013-03-07 NOTE — Progress Notes (Signed)
Subjective: Pt known to our service. Had TURBT several weeks ago with bladder perforation which was repaired at time of urological procedure and in dwelling foley catheter left. Pt pulled foley and came in with what appeared to be nec fasciitis of pelvis and underwent exp lap, drainage of abscess, washout. He was discharged to [facility] and was brought back to ED tonight for fever, confusion, lethargy.   In ED pt was febrile to 103 and tachy. Found to have his foley balloon in urethra. Foley deflated and removed and coude catheter inserted by ED. CT performed which demonstrated large pelvic fluid collection concerning for on-going bladder perforation. CCS consulted.   Pt confused.   Objective: Vital signs in last 24 hours: Temp:  [103.5 F (39.7 C)] 103.5 F (39.7 C) (03/06 2341) Pulse Rate:  [99-153] 99 (03/07 0430) Resp:  [15-32] 15 (03/07 0430) BP: (96-170)/(43-98) 101/72 mmHg (03/07 0430) SpO2:  [93 %-98 %] 96 % (03/07 0430)    Intake/Output from previous day: 03/06 0701 - 03/07 0700 In: 4325 [P.O.:325; I.V.:3000; IV Piggyback:1000] Out: 2992 [Urine:1375] Intake/Output this shift: Total I/O In: 4325 [P.O.:325; I.V.:3000; IV Piggyback:1000] Out: 4268 [Urine:1375]  Awake, confused. Not oriented cta  Tachy  Soft, nd, open lower midline wound. Suture visible. No cellulitis. nontender  Lab Results:   Recent Labs  03/06/13 2335  WBC 27.4*  HGB 12.1*  HCT 34.9*  PLT 146*   BMET  Recent Labs  03/06/13 2335  NA 139  K 4.5  CL 101  CO2 22  GLUCOSE 98  BUN 22  CREATININE 1.72*  CALCIUM 8.3*   PT/INR No results found for this basename: LABPROT, INR,  in the last 72 hours ABG No results found for this basename: PHART, PCO2, PO2, HCO3,  in the last 72 hours  Studies/Results: Dg Chest 2 View  03/07/2013   CLINICAL DATA:  Sepsis, fever  EXAM: CHEST  2 VIEW  COMPARISON:  Prior radiograph from 02/22/2013  FINDINGS: Tip of a left PICC catheter overlies the  cavoatrial junction. The cardiac and mediastinal silhouettes are stable in size and contour, and remain within normal limits.  COPD changes again noted. There is patchy opacity within the retrocardiac left lower lobe, which may reflect atelectasis or possibly infiltrate. There is mild subsegmental atelectasis within the bilateral lung bases. No airspace consolidation, pleural effusion, or pulmonary edema is identified. There is no pneumothorax.  No acute osseous abnormality identified.  IMPRESSION: 1. Patchy opacity within the retrocardiac left lower lobe, which may reflect atelectasis or infiltrate. 2. COPD.   Electronically Signed   By: Jeannine Boga M.D.   On: 03/07/2013 01:29   Ct Abdomen Pelvis W Contrast  03/07/2013   CLINICAL DATA:  Fever and altered mental status. Recent necrotizing fasciitis. Sacral decubitus ulcer.  EXAM: CT ABDOMEN AND PELVIS WITH CONTRAST  TECHNIQUE: Multidetector CT imaging of the abdomen and pelvis was performed using the standard protocol following bolus administration of intravenous contrast.  CONTRAST:  148mL OMNIPAQUE IOHEXOL 300 MG/ML  SOLN  COMPARISON:  02/18/2013  FINDINGS: BODY WALL: Unremarkable.  LOWER CHEST: Partial clearing of bibasilar pneumonia or aspiration pneumonitis. Coronary atherosclerosis.  ABDOMEN/PELVIS:  Liver: No focal abnormality.  Biliary: No evidence of biliary obstruction or stone.  Pancreas: Unremarkable.  Spleen: Unremarkable.  Adrenals: Unremarkable.  Kidneys and ureters: Mild bilateral hydronephrosis related to mass effect in the pelvis. Symmetric renal enhancement.  Bladder: Gas in the bladder which is usually from instrumentation. Bladder is circumferentially thick walled and hyper enhancing.  The mucosa is interrupted are on the left, likely communicating with the pelvic abscess.  Reproductive: There is in the elongated water density collection in the region of the posterior penile urethra measuring 5 cm in length by 2.4 cm in width. This is  incompletely imaged. Tissue extending into the lower bladder lumen is likely hypertrophied prostate, similar to 2008 imaging.  Bowel: No obstruction. Appendix not clearly visualized. No pericecal inflammation. Colonic diverticulosis.  Retroperitoneum: In the region of previously seen gas-forming collection, there is now a predominately fluid collection (although also containing gas) in the extraperitoneal space of the lower abdomen and pelvis. The collection extends from the patient's incision (which is open at the level of the skin) inferiorly into the left, along the left pelvic sidewall. The collection measures up to 11 cm in AP dimension and 8 cm in width. There is thickening of the lower left rectus abdominis which could represent postoperative hemorrhage or inflammatory edema. The fascia around the rectum is circumferentially thickened.  Peritoneum: No free fluid or gas.  Vascular: No acute abnormality.  OSSEOUS: Subchondral erosions in the mid and lower lumbar spine or chronic and smooth, consider degenerative rather than infectious.  These results were called by telephone at the time of interpretation on 03/07/2013 at 225 AM to Dr. Linton Flemings , who verbally acknowledged these results.  IMPRESSION: 1. Recurrent or persistent abdominopelvic abscess, measuring up to 11 x 8 x 7 cm. The abscess communicates with the bladder lumen or bladder wall on the left (presumably the site of recent tumor resection). 2. Low-density expansion of the posterior penile urethra, likely malpositioned Foley catheter with inflated balloon. Correlate with exam. 3. Bilateral mild hydronephrosis from bladder distention or mass effect in the pelvis.   Electronically Signed   By: Jorje Guild M.D.   On: 03/07/2013 02:25    Anti-infectives: Anti-infectives   Start     Dose/Rate Route Frequency Ordered Stop   03/07/13 0800  piperacillin-tazobactam (ZOSYN) IVPB 3.375 g     3.375 g 12.5 mL/hr over 240 Minutes Intravenous 3 times per  day 03/07/13 0105     03/07/13 0115  vancomycin (VANCOCIN) 1,250 mg in sodium chloride 0.9 % 250 mL IVPB     1,250 mg 166.7 mL/hr over 90 Minutes Intravenous Every 24 hours 03/07/13 0105     03/07/13 0115  piperacillin-tazobactam (ZOSYN) IVPB 3.375 g     3.375 g 12.5 mL/hr over 240 Minutes Intravenous  Once 03/07/13 0105 03/07/13 0207      Assessment/Plan: S/p TURBT with bladder perforation Acute renal insufficiency Intra/abdominal wall fluid collections Bladder perforation UTI Dementia Bladder cancer  Not much for [place] to offer Appears that since foley dislodged it caused urine to leak out thru known bladder leak/perforation. Discussed with urologist. rec IR drainage of fluid collections Cont wet-dry fluid collections Leave foley in place  Not much for [place] to do.   Leighton Ruff. Redmond Pulling, MD, FACS General, Bariatric, & Minimally Invasive Surgery [facility] Surgery, [place]   LOS: 1 day    Gayland Curry 03/07/2013

## 2013-03-07 NOTE — Progress Notes (Signed)
Nothing further for [place] to offer.  If you need assistance call urology.  Kathryne Eriksson. Dahlia Bailiff, MD, [place] [phone] [facility] Surgery

## 2013-03-07 NOTE — ED Notes (Signed)
Family left and will call for an update.

## 2013-03-07 NOTE — ED Notes (Signed)
Creta Levin, RN updated on pt. Status.

## 2013-03-07 NOTE — H&P (Signed)
[facility] History and Physical  Randall Barrozo FY:[id number] DOB: Aug 07, 1945 DOA: 03/06/2013  Referring physician: EDP PCP: [name], MD  Specialists:   Chief Complaint:  Lethargy , confusion, and Fever and Chills  HPI: Jason [name] is a 68 y.o. male from the [facility] and rehab center who was sent to the ED due to increased confusion and lethargy along with fevers chills and rigors in the evening.   He was found to have a temperature of 100.8 at [facility] and when he arrived in the ED he had a temperature of 103.5.   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   He was discharged to the SNF for continue IV Antibiotic Rx via PICC line with IV Flagyl for  A 4 week period to end on  02/19/2013.   He was also getting care of his stage III Sacral ulcer as well at the SNF.   In the ED this evening he was sent for a CT scan of the ABD/Pelvis which revealed recurrence of the Abdomino-Pelvic Abscess measuring 11 X 8 x 7 cm.   He was also found to have bilateral Hydronephrosis.  Cultures of the Blood and Urine were sent and he was placed on IV Vancomycin and Zosyn.      Review of Systems:  Constitutional: No Weight Loss, No Weight Gain, Night Sweats, Fevers, Chills, Fatigue, or Generalized Weakness HEENT: No Headaches, Difficulty Swallowing,Tooth/Dental Problems,Sore Throat,  No Sneezing, Rhinitis, Ear Ache, Nasal Congestion, or Post Nasal Drip,  Cardio-vascular:  No Chest pain, Orthopnea, PND, Edema in lower extremities, Anasarca, Dizziness, Palpitations  Resp: No Dyspnea, No DOE, No Productive Cough, No Non-Productive Cough, No Hemoptysis, No Change in Color of Mucus,  No Wheezing.    GI: No Heartburn, Indigestion, Abdominal Pain, Nausea, Vomiting, Diarrhea, Change in Bowel  Habits,  Loss of Appetite  GU: No Dysuria, Change in Color of Urine, No Urgency or Frequency.  No flank pain.  Musculoskeletal: No Joint Pain or Swelling.  No Decreased Range of Motion. No Back Pain.  Neurologic: No Syncope, No Seizures, Muscle Weakness, Paresthesia, Vision Disturbance or Loss, No Diplopia, No Vertigo, No Difficulty Walking,  Skin: No Rash or Lesions. Psych: No Change in Mood or Affect. No Depression or Anxiety. No Memory loss. No Confusion or Hallucinations   Past Medical History  Diagnosis Date  . Hypertension   . Stroke     Dementia, otherwise, no residual  . Bladder mass 02-10-13    surgery planned for this  . 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 COPD (chronic obstructive pulmonary disease)     previous history and current smoking  . 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 Dementia 02/18/2013      Past Surgical History  Procedure Laterality Date  . Transurethral resection of bladder tumor with gyrus (turbt-gyrus) N/A 02/13/2013    Procedure: TRANSURETHRAL RESECTION OF BLADDER TUMOR WITH GYRUS (TURBT-GYRUS), bladder biopsies;  Surgeon: Ardis Hughs, MD;  Location: [facility];  Service: Urology;  Laterality: N/A;  . Cystoscopy/retrograde/ureteroscopy Bilateral 02/13/2013    Procedure: BILATERAL RETROGRADE;  Surgeon: Ardis Hughs, MD;  Location: [facility] OR[facility];  Service: Urology;  Laterality: Bilateral;  . Laparotomy N/A 02/18/2013    Procedure: EXPLORATORY LAPAROTOMY drainage of retroperitoneal abscess, drainage of Pre-Peritoneal abscess and Exploration of bladder perforation;  Surgeon: Wilmon Arms. Corliss Skains, MD;  Location: [facility] OR;  Service: General;  Laterality: N/A;       Prior to Admission medications   Medication Sig Start Date End Date Taking? Authorizing Provider  acetaminophen (TYLENOL) 325 MG tablet Take 2 tablets (650 mg total) by mouth every 6 (six) hours as needed for mild pain (or Fever >/= 101). 03/02/13  Yes Shanker Levora Dredge, MD   collagenase (SANTYL) ointment Apply topically daily. Apply to sacral wound daily. 03/02/13  Yes Shanker Levora Dredge, MD  dextrose 5 % SOLN 50 mL with cefTRIAXone 1 G SOLR 1 g Inject 1 g into the vein daily. For 4 weeks from 02/19/13. 03/02/13  Yes Shanker Levora Dredge, MD  docusate sodium (COLACE) 100 MG capsule Take 1 capsule (100 mg total) by mouth 2 (two) times daily as needed (take to keep stool soft.). 02/13/13  Yes Crist Fat, MD  folic acid (FOLVITE) 1 MG tablet Take 1 tablet (1 mg total) by mouth daily. 03/02/13  Yes Shanker Levora Dredge, MD  HYDROcodone-acetaminophen (NORCO/VICODIN) 5-325 MG per tablet Take 1 tablet by mouth every 6 (six) hours as needed for moderate pain or severe pain.   Yes Historical Provider, MD  insulin aspart (NOVOLOG) 100 UNIT/ML injection 0-9 Units, Subcutaneous, 3 times daily before meals & bedtime CBG < 70: implement hypoglycemia protocol CBG 70 - 120: 0 units CBG 121 - 150: 1 unit CBG 151 - 200: 2 units CBG 201 - 250: 3 units CBG 251 - 300: 5 units CBG 301 - 350: 7 units CBG 351 - 400: 9 units CBG > 400 03/02/13  Yes Shanker Levora Dredge, MD  iron polysaccharides (NIFEREX) 150 MG capsule Take 1 capsule (150 mg total) by mouth daily. 03/02/13  Yes Shanker Levora Dredge, MD  metroNIDAZOLE (FLAGYL) 500 MG tablet Take 1 tablet (500 mg total) by mouth every 8 (eight) hours. For 4 weeks from 02/19/13 03/02/13  Yes Shanker Levora Dredge, MD  Multiple Vitamin (MULTIVITAMIN WITH MINERALS) TABS tablet Take 1 tablet by mouth daily. 03/02/13  Yes Shanker Levora Dredge, MD  predniSONE (DELTASONE) 10 MG tablet Take 2 tablets (20 mg) daily for 2 days, then, Take 1 tablets (10 mg) daily for 2 days, then, Take 0.5 tablets (5 mg) daily for 1 days, then stop 03/02/13  Yes Shanker Levora Dredge, MD  thiamine 100 MG tablet Take 1 tablet (100 mg total) by mouth daily. 03/02/13  Yes Shanker Levora Dredge, MD  feeding supplement, GLUCERNA SHAKE, (GLUCERNA SHAKE) LIQD Take 237 mLs by mouth daily. 03/02/13   Shanker Levora Dredge, MD      No Known Allergies   Social History:  reports that he has been smoking.  He does not have any smokeless tobacco history on file. He reports that he drinks alcohol. He reports that he does not use illicit drugs.     No family history on file.     Physical Exam:  GEN:  Pleasant  68 y.o. male  examined  and in no acute distress; cooperative with exam Filed Vitals:   03/07/13 0630 03/07/13 0700 03/07/13 0746 03/07/13 0800  BP: 104/47 95/52  94/45  Pulse:  93  99  Temp:   98.7 F (37.1 C)   TempSrc:   Oral   Resp: 19 15  43  Height:      Weight:      SpO2:  96%  90%   Blood pressure 94/45, pulse 99, temperature 98.7 F (37.1 C), temperature  source Oral, resp. rate 43, height 6' (1.829 m), weight 66.1 kg (145 lb 11.6 oz), SpO2 90.00%. PSYCH: He is alert and oriented x1; does not appear anxious does not appear depressed; affect is normal HEENT: Normocephalic and Atraumatic, Mucous membranes pink; PERRLA; EOM intact; Fundi:  Benign;  No scleral icterus, Nares: Patent, Oropharynx: Clear, Edentulous, Neck:  FROM, no cervical lymphadenopathy nor thyromegaly or carotid bruit; no JVD; Breasts:: Not examined CHEST WALL: No tenderness CHEST: Normal respiration, clear to auscultation bilaterally HEART: Regular rate and rhythm; no murmurs rubs or gallops BACK: No kyphosis or scoliosis; no CVA tenderness ABDOMEN: Positive Bowel Sounds, Mid-line surgical Scar with wide 2 cm margin, clean base; soft but +Tenderness to palpation diffusely, no masses, no organomegaly, no pannus; no intertriginous candida. Rectal Exam: Not done EXTREMITIES: No cyanosis, clubbing or edema; no ulcerations. Genitalia: not examined PULSES: 2+ and symmetric SKIN: Normal hydration no rash or ulceration CNS:  A x O X 1,  Generalized Weakness, Bedbound,   Vascular: pulses palpable throughout    Labs on Admission:  Basic Metabolic Panel:  Recent Labs Lab 03/06/13 2335 03/07/13 0555  NA 139 139   K 4.5 4.3  CL 101 106  CO2 22 22  GLUCOSE 98 101*  BUN 22 17  CREATININE 1.72* 1.71*  CALCIUM 8.3* 7.6*   Liver Function Tests:  Recent Labs Lab 03/06/13 2335  AST 21  ALT 25  ALKPHOS 102  BILITOT 0.7  PROT 6.4  ALBUMIN 2.4*   No results found for this basename: LIPASE, AMYLASE,  in the last 168 hours No results found for this basename: AMMONIA,  in the last 168 hours CBC:  Recent Labs Lab 03/06/13 2335 03/07/13 0555  WBC 27.4* 31.9*  NEUTROABS 26.0*  --   HGB 12.1* 11.5*  HCT 34.9* 34.4*  MCV 70.2* 71.1*  PLT 146* 119*   Cardiac Enzymes: No results found for this basename: CKTOTAL, CKMB, CKMBINDEX, TROPONINI,  in the last 168 hours  BNP (last 3 results) No results found for this basename: PROBNP,  in the last 8760 hours CBG:  Recent Labs Lab 03/01/13 1954 03/02/13 0041 03/02/13 0759 03/02/13 1211 03/07/13 0509  GLUCAP 157* 96 86 148* 96    Radiological Exams on Admission: Dg Chest 2 View  03/07/2013   CLINICAL DATA:  Sepsis, fever  EXAM: CHEST  2 VIEW  COMPARISON:  Prior radiograph from 02/22/2013  FINDINGS: Tip of a left PICC catheter overlies the cavoatrial junction. The cardiac and mediastinal silhouettes are stable in size and contour, and remain within normal limits.  COPD changes again noted. There is patchy opacity within the retrocardiac left lower lobe, which may reflect atelectasis or possibly infiltrate. There is mild subsegmental atelectasis within the bilateral lung bases. No airspace consolidation, pleural effusion, or pulmonary edema is identified. There is no pneumothorax.  No acute osseous abnormality identified.  IMPRESSION: 1. Patchy opacity within the retrocardiac left lower lobe, which may reflect atelectasis or infiltrate. 2. COPD.   Electronically Signed   By: Jeannine Boga M.D.   On: 03/07/2013 01:29   Ct Abdomen Pelvis W Contrast  03/07/2013   CLINICAL DATA:  Fever and altered mental status. Recent necrotizing fasciitis. Sacral  decubitus ulcer.  EXAM: CT ABDOMEN AND PELVIS WITH CONTRAST  TECHNIQUE: Multidetector CT imaging of the abdomen and pelvis was performed using the standard protocol following bolus administration of intravenous contrast.  CONTRAST:  131mL OMNIPAQUE IOHEXOL 300 MG/ML  SOLN  COMPARISON:  02/18/2013  FINDINGS: BODY  WALL: Unremarkable.  LOWER CHEST: Partial clearing of bibasilar pneumonia or aspiration pneumonitis. Coronary atherosclerosis.  ABDOMEN/PELVIS:  Liver: No focal abnormality.  Biliary: No evidence of biliary obstruction or stone.  Pancreas: Unremarkable.  Spleen: Unremarkable.  Adrenals: Unremarkable.  Kidneys and ureters: Mild bilateral hydronephrosis related to mass effect in the pelvis. Symmetric renal enhancement.  Bladder: Gas in the bladder which is usually from instrumentation. Bladder is circumferentially thick walled and hyper enhancing. The mucosa is interrupted are on the left, likely communicating with the pelvic abscess.  Reproductive: There is in the elongated water density collection in the region of the posterior penile urethra measuring 5 cm in length by 2.4 cm in width. This is incompletely imaged. Tissue extending into the lower bladder lumen is likely hypertrophied prostate, similar to 2008 imaging.  Bowel: No obstruction. Appendix not clearly visualized. No pericecal inflammation. Colonic diverticulosis.  Retroperitoneum: In the region of previously seen gas-forming collection, there is now a predominately fluid collection (although also containing gas) in the extraperitoneal space of the lower abdomen and pelvis. The collection extends from the patient's incision (which is open at the level of the skin) inferiorly into the left, along the left pelvic sidewall. The collection measures up to 11 cm in AP dimension and 8 cm in width. There is thickening of the lower left rectus abdominis which could represent postoperative hemorrhage or inflammatory edema. The fascia around the rectum is  circumferentially thickened.  Peritoneum: No free fluid or gas.  Vascular: No acute abnormality.  OSSEOUS: Subchondral erosions in the mid and lower lumbar spine or chronic and smooth, consider degenerative rather than infectious.  These results were called by telephone at the time of interpretation on 03/07/2013 at 225 AM to Dr. Linton Flemings , who verbally acknowledged these results.  IMPRESSION: 1. Recurrent or persistent abdominopelvic abscess, measuring up to 11 x 8 x 7 cm. The abscess communicates with the bladder lumen or bladder wall on the left (presumably the site of recent tumor resection). 2. Low-density expansion of the posterior penile urethra, likely malpositioned Foley catheter with inflated balloon. Correlate with exam. 3. Bilateral mild hydronephrosis from bladder distention or mass effect in the pelvis.   Electronically Signed   By: Jorje Guild M.D.   On: 03/07/2013 02:25      EKG: Independently reviewed.     Assessment/Plan:   68 y.o. male with  Principal Problem:   Sepsis Active Problems:   HYPERTENSION   Dementia   Hematuria   Urothelial carcinoma   Acute encephalopathy   Postoperative intra-abdominal abscess   Hydronephrosis, bilateral   Pressure ulcer, stage III(707.23)   1.  Sepsis-   Blood Cultures Sent, and placed on IV Vanc and Zosyn and IVFs for Fluid Resuscitation.    2.  Hematuria- due to UTI, and or Urothelial Carcinoma.  On Antibiotics Above in #1, and monitor hematuria.     3.  Urothelial Carcinoma S/p Turp.  Notify [facility] of Admission.     4.  Recurrent Intra-Abd Abscess on Ct scan-   IV Antibiotic, and Seen in ED by Dr. Redmond Pulling General Surgery.    5.  Hydronephrosis- due to foley Obstruction, replaced with a Coude in ED by EDP and relieved Obstruction.    6.   Acute encephalopathy- due to #1.    7.   Pressure Ulcer (Stge III or Unstagable)  -  Wound Care Eval for continued Rx.    8.   Dementia- chronic .     9.  DVT Prophylaxis with Lovenox.       Code Status:   FULL CODE Family Communication:    No Family present Disposition Plan:       Inpatient  Time spent:  N[place] C [facility] Pager [phone]   If 7PM-7AM, please contact night-coverage [email or web address] Password [email or web address] 03/07/2013, 8:29 AM

## 2013-03-07 NOTE — ED Notes (Signed)
Dr. Arnoldo Morale at [facility].

## 2013-03-08 ENCOUNTER — Inpatient Hospital Stay (HOSPITAL_COMMUNITY): Payer: Medicare (Managed Care)

## 2013-03-08 DIAGNOSIS — J9600 Acute respiratory failure, unspecified whether with hypoxia or hypercapnia: Secondary | ICD-10-CM

## 2013-03-08 LAB — URINE CULTURE
CULTURE: NO GROWTH
Colony Count: NO GROWTH

## 2013-03-08 LAB — CBC
HCT: 29.9 % — ABNORMAL LOW (ref 39.0–52.0)
Hemoglobin: 10.1 g/dL — ABNORMAL LOW (ref 13.0–17.0)
MCH: 24.2 pg — AB (ref 26.0–34.0)
MCHC: 33.8 g/dL (ref 30.0–36.0)
MCV: 71.5 fL — AB (ref 78.0–100.0)
PLATELETS: 104 10*3/uL — AB (ref 150–400)
RBC: 4.18 MIL/uL — ABNORMAL LOW (ref 4.22–5.81)
RDW: 15.4 % (ref 11.5–15.5)
WBC: 12 10*3/uL — AB (ref 4.0–10.5)

## 2013-03-08 LAB — BASIC METABOLIC PANEL
BUN: 12 mg/dL (ref 6–23)
CALCIUM: 8.1 mg/dL — AB (ref 8.4–10.5)
CHLORIDE: 103 meq/L (ref 96–112)
CO2: 22 mEq/L (ref 19–32)
Creatinine, Ser: 0.97 mg/dL (ref 0.50–1.35)
GFR calc non Af Amer: 82 mL/min — ABNORMAL LOW (ref >90)
Glucose, Bld: 52 mg/dL — ABNORMAL LOW (ref 70–99)
Potassium: 4.3 mEq/L (ref 3.7–5.3)
SODIUM: 140 meq/L (ref 137–147)

## 2013-03-08 LAB — PRO B NATRIURETIC PEPTIDE: PRO B NATRI PEPTIDE: 545.3 pg/mL — AB (ref 0–125)

## 2013-03-08 NOTE — Progress Notes (Signed)
[facility] PROGRESS NOTE  Jason [name] OHY:[id number] DOB: December 20, 1945 DOA: 03/06/2013 PCP: [name], MD  Assessment/Plan: Principal Problem:   Sepsis Active Problems:   NICOTINE ADDICTION   HYPERTENSION   Acute respiratory failure with hypoxia: Respiratory status significantly decreased since yesterday. Currently on Venturi facemask satting at 100%. Try to wean down oxygen requirements. Check chest x-ray. Exam clear, no audible signs of wheezing. Check BNP   Dementia: Patient quite appropriate.     Hematuria: Secondary to Foley and carcinoma   Urothelial carcinoma   Acute encephalopathy: Improved   Postoperative intra-abdominal abscess: Continue broad-spectrum antibiotics, wound care.  Noted worsening white blood cell count, recheck this afternoon. If white count still elevated, consider aspiration pneumonia versus worsening abscess.   Hydronephrosis, bilateral: Status post Foley placement.   Pressure ulcer, stage RSW(546.27): Wound care to see. We'll add overlay mattress   Code Status: Full Code Family Communication: Spoke with sister by phone Disposition Plan: Continue in [place]   Consultants:  Urology  Interventional radiology  Procedures:  Status post drain placement by interventional radiology 3/7  Antibiotics:  IV Zosyn/vancomycin 3/7-present  HPI/Subjective: Patient doing okay. No pain. He is complaining as of trouble breathing.  Objective: Filed Vitals:   03/08/13 1205  BP:   Pulse:   Temp: 98.1 F (36.7 C)  Resp:     Intake/Output Summary (Last 24 hours) at 03/08/13 1255 Last data filed at 03/08/13 1205  Gross per 24 hour  Intake  337.5 ml  Output   2085 ml  Net -1747.5 ml   Filed Weights   03/07/13 0430 03/07/13 1831  Weight: 66.1 kg (145 lb 11.6 oz) 64.4 kg (141 lb 15.6 oz)    Exam:   General:  Alert and oriented x3, mild distress secondary to respiratory breathing  Cardiovascular: regular rate and rhythm,  S1-S2  Respiratory: decreased breath sounds throughout  Abdomen: soft, non-distended, open wound vertically down abdomen  Musculoskeletal: no clubbing or cyanosis or edema  Data Reviewed: Basic Metabolic Panel:  Recent Labs Lab 03/06/13 2335 03/07/13 0555  NA 139 139  K 4.5 4.3  CL 101 106  CO2 22 22  GLUCOSE 98 101*  BUN 22 17  CREATININE 1.72* 1.71*  CALCIUM 8.3* 7.6*   Liver Function Tests:  Recent Labs Lab 03/06/13 2335  AST 21  ALT 25  ALKPHOS 102  BILITOT 0.7  PROT 6.4  ALBUMIN 2.4*   No results found for this basename: LIPASE, AMYLASE,  in the last 168 hours No results found for this basename: AMMONIA,  in the last 168 hours CBC:  Recent Labs Lab 03/06/13 2335 03/07/13 0555  WBC 27.4* 31.9*  NEUTROABS 26.0*  --   HGB 12.1* 11.5*  HCT 34.9* 34.4*  MCV 70.2* 71.1*  PLT 146* 119*   Cardiac Enzymes: No results found for this basename: CKTOTAL, CKMB, CKMBINDEX, TROPONINI,  in the last 168 hours BNP (last 3 results) No results found for this basename: PROBNP,  in the last 8760 hours CBG:  Recent Labs Lab 03/01/13 1954 03/02/13 0041 03/02/13 0759 03/02/13 1211 03/07/13 0509  GLUCAP 157* 96 86 148* 96    Recent Results (from the past 240 hour(s))  CULTURE, BLOOD (ROUTINE X 2)     Status: None   Collection Time    03/06/13 11:35 PM      Result Value Ref Range Status   Specimen Description BLOOD RIGHT FOREARM   Final   Special Requests BOTTLES DRAWN AEROBIC AND ANAEROBIC 10CC  Final   Culture  Setup Time     Final   Value: 03/07/2013 03:30     Performed at [facility]   Culture     Final   Value:        BLOOD CULTURE RECEIVED NO GROWTH TO DATE CULTURE WILL BE HELD FOR 5 DAYS BEFORE ISSUING A FINAL NEGATIVE REPORT     Performed at [facility]   Report Status PENDING   Incomplete  CULTURE, BLOOD (ROUTINE X 2)     Status: None   Collection Time    03/07/13 12:26 AM      Result Value Ref Range Status   Specimen  Description BLOOD LEFT PICC LINE   Final   Special Requests BOTTLES DRAWN AEROBIC AND ANAEROBIC 10CC EACH   Final   Culture  Setup Time     Final   Value: 03/07/2013 11:51     Performed at [facility]   Culture     Final   Value:        BLOOD CULTURE RECEIVED NO GROWTH TO DATE CULTURE WILL BE HELD FOR 5 DAYS BEFORE ISSUING A FINAL NEGATIVE REPORT     Performed at [facility]   Report Status PENDING   Incomplete  URINE CULTURE     Status: None   Collection Time    03/07/13  2:22 AM      Result Value Ref Range Status   Specimen Description URINE, CATHETERIZED   Final   Special Requests Immunocompromised   Final   Culture  Setup Time     Final   Value: 03/07/2013 11:47     Performed at [place]     Final   Value: NO GROWTH     Performed at [facility]   Culture     Final   Value: NO GROWTH     Performed at [facility]   Report Status 03/08/2013 FINAL   Final  CULTURE, ROUTINE-ABSCESS     Status: None   Collection Time    03/07/13  2:46 PM      Result Value Ref Range Status   Specimen Description ABSCESS   Final   Special Requests PELVIC   Final   Gram Stain PENDING   Incomplete   Culture     Final   Value: NO GROWTH 1 DAY     Performed at [facility]   Report Status PENDING   Incomplete  ANAEROBIC CULTURE     Status: None   Collection Time    03/07/13  2:46 PM      Result Value Ref Range Status   Specimen Description ABSCESS   Final   Special Requests PELVIC   Final   Gram Stain PENDING   Incomplete   Culture     Final   Value: NO ANAEROBES ISOLATED; CULTURE IN PROGRESS FOR 5 DAYS     Performed at [facility]   Report Status PENDING   Incomplete     Studies: Dg Chest 2 View  03/07/2013   CLINICAL DATA:  Sepsis, fever  EXAM: CHEST  2 VIEW  COMPARISON:  Prior radiograph from 02/22/2013  FINDINGS: Tip of a left PICC catheter overlies the cavoatrial junction. The cardiac and mediastinal  silhouettes are stable in size and contour, and remain within normal limits.  COPD changes again noted. There is patchy opacity within the retrocardiac left lower lobe, which may reflect  atelectasis or possibly infiltrate. There is mild subsegmental atelectasis within the bilateral lung bases. No airspace consolidation, pleural effusion, or pulmonary edema is identified. There is no pneumothorax.  No acute osseous abnormality identified.  IMPRESSION: 1. Patchy opacity within the retrocardiac left lower lobe, which may reflect atelectasis or infiltrate. 2. COPD.   Electronically Signed   By: Jeannine Boga M.D.   On: 03/07/2013 01:29   Ct Abdomen Pelvis W Contrast  03/07/2013   CLINICAL DATA:  Fever and altered mental status. Recent necrotizing fasciitis. Sacral decubitus ulcer.  EXAM: CT ABDOMEN AND PELVIS WITH CONTRAST  TECHNIQUE: Multidetector CT imaging of the abdomen and pelvis was performed using the standard protocol following bolus administration of intravenous contrast.  CONTRAST:  164mL OMNIPAQUE IOHEXOL 300 MG/ML  SOLN  COMPARISON:  02/18/2013  FINDINGS: BODY WALL: Unremarkable.  LOWER CHEST: Partial clearing of bibasilar pneumonia or aspiration pneumonitis. Coronary atherosclerosis.  ABDOMEN/PELVIS:  Liver: No focal abnormality.  Biliary: No evidence of biliary obstruction or stone.  Pancreas: Unremarkable.  Spleen: Unremarkable.  Adrenals: Unremarkable.  Kidneys and ureters: Mild bilateral hydronephrosis related to mass effect in the pelvis. Symmetric renal enhancement.  Bladder: Gas in the bladder which is usually from instrumentation. Bladder is circumferentially thick walled and hyper enhancing. The mucosa is interrupted are on the left, likely communicating with the pelvic abscess.  Reproductive: There is in the elongated water density collection in the region of the posterior penile urethra measuring 5 cm in length by 2.4 cm in width. This is incompletely imaged. Tissue extending into the  lower bladder lumen is likely hypertrophied prostate, similar to 2008 imaging.  Bowel: No obstruction. Appendix not clearly visualized. No pericecal inflammation. Colonic diverticulosis.  Retroperitoneum: In the region of previously seen gas-forming collection, there is now a predominately fluid collection (although also containing gas) in the extraperitoneal space of the lower abdomen and pelvis. The collection extends from the patient's incision (which is open at the level of the skin) inferiorly into the left, along the left pelvic sidewall. The collection measures up to 11 cm in AP dimension and 8 cm in width. There is thickening of the lower left rectus abdominis which could represent postoperative hemorrhage or inflammatory edema. The fascia around the rectum is circumferentially thickened.  Peritoneum: No free fluid or gas.  Vascular: No acute abnormality.  OSSEOUS: Subchondral erosions in the mid and lower lumbar spine or chronic and smooth, consider degenerative rather than infectious.  These results were called by telephone at the time of interpretation on 03/07/2013 at 225 AM to Dr. Linton Flemings , who verbally acknowledged these results.  IMPRESSION: 1. Recurrent or persistent abdominopelvic abscess, measuring up to 11 x 8 x 7 cm. The abscess communicates with the bladder lumen or bladder wall on the left (presumably the site of recent tumor resection). 2. Low-density expansion of the posterior penile urethra, likely malpositioned Foley catheter with inflated balloon. Correlate with exam. 3. Bilateral mild hydronephrosis from bladder distention or mass effect in the pelvis.   Electronically Signed   By: Jorje Guild M.D.   On: 03/07/2013 02:25   Ct Image Guided Fluid Drain By Catheter  03/07/2013   CLINICAL DATA:  Bladder rupture and left pelvic abscess.  EXAM: CT GUIDED DRAINAGE OF PELVIC PERITONEAL ABSCESS  ANESTHESIA/SEDATION: 1.0 Mg IV Versed 25 mcg IV Fentanyl  Total Moderate Sedation Time:  20  minutes  PROCEDURE: The procedure, risks, benefits, and alternatives were explained to the patient. Questions regarding the procedure were encouraged  and answered. Informed consent was obtained from the patient's daughter due to underlying dementia.  The left lower anterior abdominal wall was prepped with Betadine in a sterile fashion, and a sterile drape was applied covering the operative field. A sterile gown and sterile gloves were used for the procedure. Local anesthesia was provided with 1% Lidocaine.  CT was performed in a supine position. Under CT fluoroscopic guidance, a 17 gauge trocar needle was advanced into the left pelvis. Aspiration was performed and a fluid sample sent for culture analysis. A guidewire was advanced. The tract was dilated and a 12 French pigtail drainage catheter placed over the wire. The catheter was flushed and connected to a suction bulb. It was secured at the skin with a Prolene retention suture and StatLock device.  COMPLICATIONS: None  FINDINGS: Initial CT performed in a supine position shows the presence of a Foley catheter in the bladder which is displaced slightly to the right of midline. Excreted contrast material from CT is present in the bladder. There is visible extravasation of diluted contrast from the left lateral wall of the bladder into the region of the elongated left-sided pelvic abscess. Aspiration yielded turbid fluid which was sent for culture. A 12 French drain was placed and there is good return of fluid.  IMPRESSION: CT-guided drainage of left pelvic abscess with placement of a 12 French percutaneous drain. This was attached to suction bulb drainage. Initial CT does confirm the presence of bladder rupture with leakage of diluted contrast from the left lateral wall of the bladder.   Electronically Signed   By: Aletta Edouard M.D.   On: 03/07/2013 15:14    Scheduled Meds: . collagenase   Topical Daily  . feeding supplement (GLUCERNA SHAKE)  237 mL Oral  Q24H  . folic acid  1 mg Oral Daily  . iron polysaccharides  150 mg Oral Daily  . multivitamin with minerals  1 tablet Oral Daily  . piperacillin-tazobactam (ZOSYN)  IV  3.375 g Intravenous 3 times per day  . sodium chloride  10 mL Intravenous Q12H  . thiamine  100 mg Oral Daily  . vancomycin  1,250 mg Intravenous Q24H   Continuous Infusions: . sodium chloride 75 mL/hr at 03/08/13 1200    Principal Problem:   Sepsis Active Problems:   NICOTINE ADDICTION   HYPERTENSION   Acute respiratory failure with hypoxia   Dementia   Hematuria   Urothelial carcinoma   Acute encephalopathy   Postoperative intra-abdominal abscess   Hydronephrosis, bilateral   Pressure ulcer, stage [id number])    Time spent: 35 min    [facility] Pager [phone] If 7PM-7AM, please contact night-coverage at [email or web address], password [facility] 03/08/2013, 12:55 PM  LOS: 2 days

## 2013-03-08 NOTE — Progress Notes (Signed)
This is a 68 year old male well-known to urology who we are asked to consult on by Dr. Linton Flemings, M.D. for evaluation and management of pelvic fluid collection.  History of present illness: Mr. Sainvil has a history of bladder cancer and is status post TURBT which was complicated by a bladder perforation. He was discharged with a Foley catheter which was inadvertently removed by himself. He subsequently presented to the emergency department was found to have a necrotizing pelvic infection. He was taken to the operating room where he was explored and washed out. Several drains were left. He improved and his drains were slowly being backed out. However, the left pelvic drain was pulled out inadvertently by the patient earlier than anticipated. The patient was discharged to a skilled nursing facility on IV antibiotics. 3 days prior to his current presentation to the emergency department he underwent a cystogram in the urology office. This showed a persistent leak the left lateral wall the bladder. The Foley catheter was changed and the patient was scheduled for followup in 2 weeks. The patient was brought to the emergency department early last evening with a fever. He was noted to have a fever of 102.32F, a white blood cell count of 27,000, and his CT scan revealed a large left-sided pelvic fluid collection/abscess. The Foley catheter was noted to be pulled out into the urethra, and it was removed and replaced in the emergency department. Clear yellow urine was returned. He was admitted to the step down unit.  Interval: Left pelvic drain was placed by interventional radiology, to 250 cc of yellow turbid fluid was aspirated initially, a drain was left to suction a JP bulb. No acute events overnight. Patient states that left lower pelvic pain has significantly improved since placing a drain. Patient is without complaints this morning, denies any nausea, vomiting, shortness of breath or chest pain. Patient does  endorse being hungry is eager to eat lunch today. Patient has been sleeping, and not gotten out of bed since admission.  Patient Active Problem List   Diagnosis Date Noted  . Acute encephalopathy 03/07/2013  . Postoperative intra-abdominal abscess 03/07/2013  . Hydronephrosis, bilateral 03/07/2013  . Pressure ulcer, stage FTD(322.02) 03/07/2013  . UTI (lower urinary tract infection) 02/18/2013  . Sepsis 02/18/2013  . Aspiration pneumonia 02/18/2013  . Acute respiratory failure with hypoxia 02/18/2013  . Acute renal failure 02/18/2013  . Dementia 02/18/2013  . Hematuria 02/18/2013  . 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 Memory loss 03/27/2009  . CELLULITIS, HAND, LEFT 03/23/2009  . FATIGUE 03/15/2009  . ALCOHOL ABUSE 01/30/2008  . GOITER 10/25/2006  . NICOTINE ADDICTION 10/25/2006  . HYPERTENSION 10/25/2006  . COLONIC POLYPS, HX OF 10/25/2006  . BENIGN PROSTATIC HYPERTROPHY, HX OF 10/25/2006   No current facility-administered medications on file prior to encounter.   Current Outpatient Prescriptions on File Prior to Encounter  Medication Sig Dispense Refill  . acetaminophen (TYLENOL) 325 MG tablet Take 2 tablets (650 mg total) by mouth every 6 (six) hours as needed for mild pain (or Fever >/= 101).      . collagenase (SANTYL) ointment Apply topically daily. Apply to sacral wound daily.  15 g  0  . dextrose 5 % SOLN 50 mL with cefTRIAXone 1 G SOLR 1 g Inject 1 g into the vein daily. For 4 weeks from 02/19/13.      Marland Kitchen docusate sodium (COLACE) 100 MG capsule Take 1 capsule (100 mg total) by mouth 2 (two) times daily as needed (take to  keep stool soft.).  60 capsule  0  . folic acid (FOLVITE) 1 MG tablet Take 1 tablet (1 mg total) by mouth daily.      . insulin aspart (NOVOLOG) 100 UNIT/ML injection 0-9 Units, Subcutaneous, 3 times daily before meals & bedtime CBG < 70: implement hypoglycemia protocol CBG 70 - 120: 0 units CBG 121 - 150: 1 unit CBG 151 - 200: 2 units CBG 201 - 250: 3  units CBG 251 - 300: 5 units CBG 301 - 350: 7 units CBG 351 - 400: 9 units CBG > 400  10 mL  11  . iron polysaccharides (NIFEREX) 150 MG capsule Take 1 capsule (150 mg total) by mouth daily.      . metroNIDAZOLE (FLAGYL) 500 MG tablet Take 1 tablet (500 mg total) by mouth every 8 (eight) hours. For 4 weeks from 02/19/13      . Multiple Vitamin (MULTIVITAMIN WITH MINERALS) TABS tablet Take 1 tablet by mouth daily.      . predniSONE (DELTASONE) 10 MG tablet Take 2 tablets (20 mg) daily for 2 days, then, Take 1 tablets (10 mg) daily for 2 days, then, Take 0.5 tablets (5 mg) daily for 1 days, then stop  19 tablet  0  . thiamine 100 MG tablet Take 1 tablet (100 mg total) by mouth daily.      . feeding supplement, GLUCERNA SHAKE, (GLUCERNA SHAKE) LIQD Take 237 mLs by mouth daily.    0   Past Medical History  Diagnosis Date  . Hypertension   . Stroke     Dementia, otherwise, no residual  . Bladder mass 02-10-13    surgery planned for this  . 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 COPD (chronic obstructive pulmonary disease)     previous history and current smoking  . 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 Dementia 02/18/2013   Past Surgical History  Procedure Laterality Date  . Transurethral resection of bladder tumor with gyrus (turbt-gyrus) N/A 02/13/2013    Procedure: TRANSURETHRAL RESECTION OF BLADDER TUMOR WITH GYRUS (TURBT-GYRUS), bladder biopsies;  Surgeon: Ardis Hughs, MD;  Location: [facility];  Service: Urology;  Laterality: N/A;  . Cystoscopy/retrograde/ureteroscopy Bilateral 02/13/2013    Procedure: BILATERAL RETROGRADE;  Surgeon: Ardis Hughs, MD;  Location: [facility];  Service: Urology;  Laterality: Bilateral;  . Laparotomy N/A 02/18/2013    Procedure: EXPLORATORY LAPAROTOMY drainage of retroperitoneal abscess, drainage of Pre-Peritoneal abscess and Exploration of bladder perforation;  Surgeon: Imogene Burn. Tsuei, MD;  Location: [facility];  Service: General;   Laterality: N/A;   PE: Filed Vitals:   03/08/13 0505 03/08/13 0700 03/08/13 0800 03/08/13 1205  BP: 147/76  109/56   Pulse: 93     Temp: 99 F (37.2 C) 99.4 F (37.4 C) 99.4 F (37.4 C) 98.1 F (36.7 C)  TempSrc: Oral Oral Oral Oral  Resp: 18     Height:      Weight:      SpO2: 100%       Intake/Output Summary (Last 24 hours) at 03/08/13 1214 Last data filed at 03/08/13 1205  Gross per 24 hour  Intake  337.5 ml  Output   2085 ml  Net -1747.5 ml   No acute distress, sleeping but arousable Abdomen: Lower Midline incision is open with some granulation tissue, fascial sutures are exposed, there is no surrounding erythema and no purulence.  Pigtail drain was placed in the left lower quadrant, insertion site is without  surrounding erythema her induration, fluid is straw-colored. GU: The Foley is draining clearly urine is no scrotal edema. Psych: Flat affect, one line/word responses, appears to be his baseline Skin: Large ~ 6 cm x 4 cm sacral decubitus which distress with a Mediplex dressing.   Recent Labs  03/06/13 2335 03/07/13 0555  WBC 27.4* 31.9*  HGB 12.1* 11.5*  HCT 34.9* 34.4*    Recent Labs  03/06/13 2335 03/07/13 0555  NA 139 139  K 4.5 4.3  CL 101 106  CO2 22 22  GLUCOSE 98 101*  BUN 22 17  CREATININE 1.72* 1.71*  CALCIUM 8.3* 7.6*   No results found for this basename: LABPT, INR,  in the last 72 hours No results found for this basename: LABURIN,  in the last 72 hours Results for orders placed during the hospital encounter of 03/06/13  CULTURE, BLOOD (ROUTINE X 2)     Status: None   Collection Time    03/06/13 11:35 PM      Result Value Ref Range Status   Specimen Description BLOOD RIGHT FOREARM   Final   Special Requests BOTTLES DRAWN AEROBIC AND ANAEROBIC 10CC   Final   Culture  Setup Time     Final   Value: 03/07/2013 03:30     Performed at [facility]   Culture     Final   Value:        BLOOD CULTURE RECEIVED NO GROWTH TO DATE  CULTURE WILL BE HELD FOR 5 DAYS BEFORE ISSUING A FINAL NEGATIVE REPORT     Performed at [facility]   Report Status PENDING   Incomplete  CULTURE, BLOOD (ROUTINE X 2)     Status: None   Collection Time    03/07/13 12:26 AM      Result Value Ref Range Status   Specimen Description BLOOD LEFT PICC LINE   Final   Special Requests BOTTLES DRAWN AEROBIC AND ANAEROBIC 10CC EACH   Final   Culture  Setup Time     Final   Value: 03/07/2013 11:51     Performed at [facility]   Culture     Final   Value:        BLOOD CULTURE RECEIVED NO GROWTH TO DATE CULTURE WILL BE HELD FOR 5 DAYS BEFORE ISSUING A FINAL NEGATIVE REPORT     Performed at [facility]   Report Status PENDING   Incomplete  URINE CULTURE     Status: None   Collection Time    03/07/13  2:22 AM      Result Value Ref Range Status   Specimen Description URINE, CATHETERIZED   Final   Special Requests Immunocompromised   Final   Culture  Setup Time     Final   Value: 03/07/2013 11:47     Performed at [place]     Final   Value: NO GROWTH     Performed at [facility]   Culture     Final   Value: NO GROWTH     Performed at [facility]   Report Status 03/08/2013 FINAL   Final  CULTURE, ROUTINE-ABSCESS     Status: None   Collection Time    03/07/13  2:46 PM      Result Value Ref Range Status   Specimen Description ABSCESS   Final   Special Requests PELVIC   Final   Gram Stain PENDING   Incomplete  Culture     Final   Value: NO GROWTH 1 DAY     Performed at [facility]   Report Status PENDING   Incomplete  ANAEROBIC CULTURE     Status: None   Collection Time    03/07/13  2:46 PM      Result Value Ref Range Status   Specimen Description ABSCESS   Final   Special Requests PELVIC   Final   Gram Stain PENDING   Incomplete   Culture     Final   Value: NO ANAEROBES ISOLATED; CULTURE IN PROGRESS FOR 5 DAYS     Performed at [facility]    Report Status PENDING   Incomplete   CT, ab/pelv: IMPRESSION:  1. Recurrent or persistent abdominopelvic abscess, measuring up to  11 x 8 x 7 cm. The abscess communicates with the bladder lumen or  bladder wall on the left (presumably the site of recent tumor  resection).  2. Low-density expansion of the posterior penile urethra, likely  malpositioned Foley catheter with inflated balloon. Correlate with  exam.  3. Bilateral mild hydronephrosis from bladder distention or mass  effect in the pelvis.  Imp:  #1 Left-sided infected pelvic fluid collection.  #2 large sacral decubitus ulcer #3 persistent extraperitoneal bladder perforation  Recommendation: #1 will continue with the JP drain on both suction, and the Foley catheter to gravity drainage. Continue ceftriaxone and Flagyl.  #2 continue with wound care for sacral decubitus ulcer #3 would recommend the patient be seen and evaluated by physical therapy to prevent worsening deconditioning.  We will continue to follow.

## 2013-03-09 LAB — BASIC METABOLIC PANEL
BUN: 11 mg/dL (ref 6–23)
CO2: 23 mEq/L (ref 19–32)
Calcium: 7.9 mg/dL — ABNORMAL LOW (ref 8.4–10.5)
Chloride: 104 mEq/L (ref 96–112)
Creatinine, Ser: 0.84 mg/dL (ref 0.50–1.35)
GFR, EST NON AFRICAN AMERICAN: 87 mL/min — AB (ref >90)
Glucose, Bld: 51 mg/dL — ABNORMAL LOW (ref 70–99)
POTASSIUM: 3.9 meq/L (ref 3.7–5.3)
SODIUM: 140 meq/L (ref 137–147)

## 2013-03-09 LAB — CBC
HCT: 30.7 % — ABNORMAL LOW (ref 39.0–52.0)
Hemoglobin: 10.2 g/dL — ABNORMAL LOW (ref 13.0–17.0)
MCH: 23.8 pg — ABNORMAL LOW (ref 26.0–34.0)
MCHC: 33.2 g/dL (ref 30.0–36.0)
MCV: 71.6 fL — ABNORMAL LOW (ref 78.0–100.0)
Platelets: 111 10*3/uL — ABNORMAL LOW (ref 150–400)
RBC: 4.29 MIL/uL (ref 4.22–5.81)
RDW: 15.4 % (ref 11.5–15.5)
WBC: 9.3 10*3/uL (ref 4.0–10.5)

## 2013-03-09 MED ORDER — COLLAGENASE 250 UNIT/GM EX OINT
TOPICAL_OINTMENT | Freq: Every day | CUTANEOUS | Status: DC
  Administered 2013-03-10: 1 via TOPICAL
  Administered 2013-03-11: 10:00:00 via TOPICAL
  Filled 2013-03-09: qty 30

## 2013-03-09 NOTE — Progress Notes (Addendum)
Patient transferred from another unit. Has midline incision and L quadrant JP drain. Also has unstageble sacral wound. Oriented to room will continue to monitor.

## 2013-03-09 NOTE — Consult Note (Signed)
Pt known to this [place] nurse from most recent admission.  Contacted bedside nurse for update/status of sacral wound and reviewed documentation.  Pt still has necrotic 85% black sacral wound.  Restarted hydrotherapy for this reason, added air mattress for pressure redistribution.   Contacted PT department with new orders for hydrotherapy to start tomorrow and [place] nurse to evaluate this patient at the same time.   WOC will follow along with you  Para March RN,CWOCN [phone]

## 2013-03-09 NOTE — Progress Notes (Signed)
Subjective: LLQ abscess drain placed 3/7 Pt doing better Up in bed; pleasant   Objective: Vital signs in last 24 hours: Temp:  [97.8 F (36.6 C)-98.8 F (37.1 C)] 98.4 F (36.9 C) (03/09 0753) Pulse Rate:  [77-89] 86 (03/09 0753) Resp:  [13-25] 25 (03/09 0753) BP: (124-152)/(62-85) 140/75 mmHg (03/09 0753) SpO2:  [97 %-100 %] 97 % (03/09 0753) Last BM Date: 03/02/13  Intake/Output from previous day: 03/08 0701 - 03/09 0700 In: 0  Out: 1575 [Urine:1200; Drains:375] Intake/Output this shift: Total I/O In: 240 [P.O.:240] Out: 320 [Urine:250; Drains:70]  PE:  Afeb; vss Wbc 9.3(12.0) Output 375 cc yesterday 75 cc so far today--thin serous fluid Site clean and dry; NT No growth on cx   Lab Results:   Recent Labs  03/08/13 1317 03/09/13 0350  WBC 12.0* 9.3  HGB 10.1* 10.2*  HCT 29.9* 30.7*  PLT 104* 111*   BMET  Recent Labs  03/08/13 1317 03/09/13 0350  NA 140 140  K 4.3 3.9  CL 103 104  CO2 22 23  GLUCOSE 52* 51*  BUN 12 11  CREATININE 0.97 0.84  CALCIUM 8.1* 7.9*   PT/INR No results found for this basename: LABPROT, INR,  in the last 72 hours ABG No results found for this basename: PHART, PCO2, PO2, HCO3,  in the last 72 hours  Studies/Results: Dg Chest Port 1 View  03/08/2013   CLINICAL DATA:  Respiratory failure. Elevated white blood cell count.  EXAM: PORTABLE CHEST - 1 VIEW  COMPARISON:  03/07/2013  FINDINGS: There is opacity at the right lung base. This suggests a small effusion with either atelectasis or infiltrate.  Retrocardiac opacity noted on the prior day study is less apparent. The difference is likely due to differences in patient positioning and technique.  Lungs are hyperexpanded but otherwise clear. No evidence of a left effusion. No pneumothorax. Cardiac silhouette is normal in size. Left PICC is stable and well positioned.  IMPRESSION: 1. New right lung base hazy opacity likely a combination of a small effusion with either  atelectasis or infiltrate. 2. Previously described retrocardiac opacity is less apparent. 3. No other change.   Electronically Signed   By: Lajean Manes M.D.   On: 03/08/2013 17:00   Ct Image Guided Fluid Drain By Catheter  03/07/2013   CLINICAL DATA:  Bladder rupture and left pelvic abscess.  EXAM: CT GUIDED DRAINAGE OF PELVIC PERITONEAL ABSCESS  ANESTHESIA/SEDATION: 1.0 Mg IV Versed 25 mcg IV Fentanyl  Total Moderate Sedation Time:  20 minutes  PROCEDURE: The procedure, risks, benefits, and alternatives were explained to the patient. Questions regarding the procedure were encouraged and answered. Informed consent was obtained from the patient's daughter due to underlying dementia.  The left lower anterior abdominal wall was prepped with Betadine in a sterile fashion, and a sterile drape was applied covering the operative field. A sterile gown and sterile gloves were used for the procedure. Local anesthesia was provided with 1% Lidocaine.  CT was performed in a supine position. Under CT fluoroscopic guidance, a 17 gauge trocar needle was advanced into the left pelvis. Aspiration was performed and a fluid sample sent for culture analysis. A guidewire was advanced. The tract was dilated and a 12 French pigtail drainage catheter placed over the wire. The catheter was flushed and connected to a suction bulb. It was secured at the skin with a Prolene retention suture and StatLock device.  COMPLICATIONS: None  FINDINGS: Initial CT performed in a supine position shows  the presence of a Foley catheter in the bladder which is displaced slightly to the right of midline. Excreted contrast material from CT is present in the bladder. There is visible extravasation of diluted contrast from the left lateral wall of the bladder into the region of the elongated left-sided pelvic abscess. Aspiration yielded turbid fluid which was sent for culture. A 12 French drain was placed and there is good return of fluid.  IMPRESSION:  CT-guided drainage of left pelvic abscess with placement of a 12 French percutaneous drain. This was attached to suction bulb drainage. Initial CT does confirm the presence of bladder rupture with leakage of diluted contrast from the left lateral wall of the bladder.   Electronically Signed   By: Aletta Edouard M.D.   On: 03/07/2013 15:14    Anti-infectives: Anti-infectives   Start     Dose/Rate Route Frequency Ordered Stop   03/07/13 0800  piperacillin-tazobactam (ZOSYN) IVPB 3.375 g     3.375 g 12.5 mL/hr over 240 Minutes Intravenous 3 times per day 03/07/13 0105     03/07/13 0115  vancomycin (VANCOCIN) 1,250 mg in sodium chloride 0.9 % 250 mL IVPB     1,250 mg 166.7 mL/hr over 90 Minutes Intravenous Every 24 hours 03/07/13 0105     03/07/13 0115  piperacillin-tazobactam (ZOSYN) IVPB 3.375 g     3.375 g 12.5 mL/hr over 240 Minutes Intravenous  Once 03/07/13 0105 03/07/13 0207      Assessment/Plan: s/p * No surgery found *  Pelvic abscess LLQ drain placed 3/7 Doing well Output great Continue drain for now May consider re CT when output 10-15 cc/day   LOS: 3 days    Yehonatan Grandison A 03/09/2013

## 2013-03-10 DIAGNOSIS — J690 Pneumonitis due to inhalation of food and vomit: Secondary | ICD-10-CM

## 2013-03-10 DIAGNOSIS — L03119 Cellulitis of unspecified part of limb: Secondary | ICD-10-CM

## 2013-03-10 DIAGNOSIS — R188 Other ascites: Secondary | ICD-10-CM

## 2013-03-10 DIAGNOSIS — L02519 Cutaneous abscess of unspecified hand: Secondary | ICD-10-CM

## 2013-03-10 LAB — CULTURE, ROUTINE-ABSCESS: Culture: NO GROWTH

## 2013-03-10 LAB — VANCOMYCIN, TROUGH: VANCOMYCIN TR: 6.3 ug/mL — AB (ref 10.0–20.0)

## 2013-03-10 LAB — CREATININE, FLUID (PLEURAL, PERITONEAL, JP DRAINAGE): CREAT FL: 18.9 mg/dL

## 2013-03-10 LAB — FERRITIN: FERRITIN: 308 ng/mL (ref 22–322)

## 2013-03-10 LAB — IRON AND TIBC
Iron: 14 ug/dL — ABNORMAL LOW (ref 42–135)
SATURATION RATIOS: 15 % — AB (ref 20–55)
TIBC: 93 ug/dL — ABNORMAL LOW (ref 215–435)
UIBC: 79 ug/dL — AB (ref 125–400)

## 2013-03-10 MED ORDER — SENNOSIDES-DOCUSATE SODIUM 8.6-50 MG PO TABS
1.0000 | ORAL_TABLET | Freq: Every day | ORAL | Status: DC
  Administered 2013-03-10: 1 via ORAL
  Filled 2013-03-10: qty 1

## 2013-03-10 MED ORDER — SODIUM CHLORIDE 0.9 % IV SOLN
1.0000 g | INTRAVENOUS | Status: DC
  Administered 2013-03-10: 1 g via INTRAVENOUS
  Filled 2013-03-10 (×2): qty 1

## 2013-03-10 MED ORDER — VANCOMYCIN HCL IN DEXTROSE 1-5 GM/200ML-% IV SOLN
1000.0000 mg | Freq: Two times a day (BID) | INTRAVENOUS | Status: DC
  Administered 2013-03-10: 1000 mg via INTRAVENOUS
  Filled 2013-03-10: qty 200

## 2013-03-10 NOTE — Progress Notes (Signed)
PROGRESS NOTE  Jason [name] XIP:[id number] DOB: 1945-05-24 DOA: 03/06/2013 PCP: [name], MD  Assessment/Plan:  Sepsis with acute encephalopathy Resolved. Likely secondary to Pelvic abscess and possible Pneumonia Current Blood cultures are negative to date.  Pelvic Abscess Abscess culture felt to be consistent with urine from a bladder leak. Patient removed his own drain at SNF after pelvic surgery Drain replaced by IR Being managed by Urology  Will check with ID regarding antibiotics.  Acute Renal Failure with UTI U/A appeared infected. Creatinine has returned to baseline after drain replaced and antibiotics started Urine culture shows no growth  Recent urothelial carcinoma s/p resection  Unfortunately complicated by bladder perforation and further complicated by patient pulling out his cath. He subsequently developed necrotizing fascitis requiring exp lap drainage of abscess and wash out. Patient with foley and LLQ abscess drain in place Being managed by Dr. Louis Meckel.  Recent Ecoli Bacteremia Placed on Rocephin and Flagyl via PICC for 4 weeks starting 3/1. PICC was placed on 2/26. Changed to Vanc / Zosyn on admission 3/7 Appreciate ID antibiotic recommendations.  Acute respiratory distress ?HCAP vs Aspiration event during encephalopathic period. Patient experienced acute respiratory distress on 3/8 requiring venturi facemask. Symptoms appear completely resolved.  No respiratory distress.  Satting well on room air.  Sacral Decub 9.5cm x 10.5cm x 0.2cm Unstageable Pressure Ulcer Dressing procedure: enzymatic debridement ointment, moist gauze and foam.  Hydrotherapy daily m-Sat.  Air mattress in place. Appreciate wound care consultation  Microcytic anemia with Hematuria Will check ferritin and TIBC On Iron polysaccharide.    DVT Prophylaxis:  SCD  Code Status: Full Family Communication:  Disposition Plan: To SNF when appropriate.  Patient  requests a different SNF.   Consultants:  Urology  Surgery  Procedures:  LLQ Abscess Drain placed by IR on 3/7  Antibiotics: Anti-infectives   Start     Dose/Rate Route Frequency Ordered Stop   03/10/13 1300  vancomycin (VANCOCIN) IVPB 1000 mg/200 mL premix     1,000 mg 200 mL/hr over 60 Minutes Intravenous Every 12 hours 03/10/13 0243     03/07/13 0800  piperacillin-tazobactam (ZOSYN) IVPB 3.375 g     3.375 g 12.5 mL/hr over 240 Minutes Intravenous 3 times per day 03/07/13 0105     03/07/13 0115  vancomycin (VANCOCIN) 1,250 mg in sodium chloride 0.9 % 250 mL IVPB  Status:  Discontinued     1,250 mg 166.7 mL/hr over 90 Minutes Intravenous Every 24 hours 03/07/13 0105 03/10/13 0243   03/07/13 0115  piperacillin-tazobactam (ZOSYN) IVPB 3.375 g     3.375 g 12.5 mL/hr over 240 Minutes Intravenous  Once 03/07/13 0105 03/07/13 0207       HPI/Subjective: Reports he is eating well, feeling well, does not want to return to same SNF.  Objective: Filed Vitals:   03/09/13 1958 03/09/13 2328 03/10/13 0346 03/10/13 1123  BP: 144/69 153/78 159/81 139/82  Pulse: 74 68 71 65  Temp: 98.2 F (36.8 C) 98.5 F (36.9 C) 98.7 F (37.1 C) 98.8 F (37.1 C)  TempSrc: Oral Oral Oral Oral  Resp: 20 20 20 18   Height:      Weight:      SpO2: 97% 96% 97% 97%    Intake/Output Summary (Last 24 hours) at 03/10/13 1225 Last data filed at 03/10/13 0559  Gross per 24 hour  Intake 5018.75 ml  Output   3380 ml  Net 1638.75 ml   Filed Weights   03/07/13 0430 03/07/13 1831 03/09/13 1702  Weight: 66.1 kg (145 lb 11.6 oz) 64.4 kg (141 lb 15.6 oz) 66.452 kg (146 lb 8 oz)    Exam: General: 68 yo male, thin, sitting in recliner with breakfast on tray.  Pleasant. HEENT:  PERR, EOMI, Anicteic Sclera, MMM. No pharyngeal erythema or exudates  Neck: Supple, no JVD, no masses  Cardiovascular: RRR, S1 S2 auscultated, no rubs, murmurs or gallops.   Respiratory: good air movement,  Mild coarse sounds,  no accessory muscle movement - with equal chest rise  Abdomen: Soft, nontender, nondistended, + bowel sounds,  Surgical bandage is clean and dry. Extremities: warm dry without cyanosis clubbing or edema.  Psych: Awake, Alert, Cooperative, pleasantly confused.   Data Reviewed: Basic Metabolic Panel:  Recent Labs Lab 03/06/13 2335 03/07/13 0555 03/08/13 1317 03/09/13 0350  NA 139 139 140 140  K 4.5 4.3 4.3 3.9  CL 101 106 103 104  CO2 22 22 22 23   GLUCOSE 98 101* 52* 51*  BUN 22 17 12 11   CREATININE 1.72* 1.71* 0.97 0.84  CALCIUM 8.3* 7.6* 8.1* 7.9*   Liver Function Tests:  Recent Labs Lab 03/06/13 2335  AST 21  ALT 25  ALKPHOS 102  BILITOT 0.7  PROT 6.4  ALBUMIN 2.4*   CBC:  Recent Labs Lab 03/06/13 2335 03/07/13 0555 03/08/13 1317 03/09/13 0350  WBC 27.4* 31.9* 12.0* 9.3  NEUTROABS 26.0*  --   --   --   HGB 12.1* 11.5* 10.1* 10.2*  HCT 34.9* 34.4* 29.9* 30.7*  MCV 70.2* 71.1* 71.5* 71.6*  PLT 146* 119* 104* 111*   BNP (last 3 results)  Recent Labs  03/08/13 1317  PROBNP 545.3*   CBG:  Recent Labs Lab 03/07/13 0509  GLUCAP 96    Recent Results (from the past 240 hour(s))  CULTURE, BLOOD (ROUTINE X 2)     Status: None   Collection Time    03/06/13 11:35 PM      Result Value Ref Range Status   Specimen Description BLOOD RIGHT FOREARM   Final   Special Requests BOTTLES DRAWN AEROBIC AND ANAEROBIC 10CC   Final   Culture  Setup Time     Final   Value: 03/07/2013 03:30     Performed at [facility]   Culture     Final   Value:        BLOOD CULTURE RECEIVED NO GROWTH TO DATE CULTURE WILL BE HELD FOR 5 DAYS BEFORE ISSUING A FINAL NEGATIVE REPORT     Performed at [facility]   Report Status PENDING   Incomplete  CULTURE, BLOOD (ROUTINE X 2)     Status: None   Collection Time    03/07/13 12:26 AM      Result Value Ref Range Status   Specimen Description BLOOD LEFT PICC LINE   Final   Special Requests BOTTLES DRAWN AEROBIC AND  ANAEROBIC 10CC EACH   Final   Culture  Setup Time     Final   Value: 03/07/2013 11:51     Performed at [facility]   Culture     Final   Value:        BLOOD CULTURE RECEIVED NO GROWTH TO DATE CULTURE WILL BE HELD FOR 5 DAYS BEFORE ISSUING A FINAL NEGATIVE REPORT     Performed at [facility]   Report Status PENDING   Incomplete  URINE CULTURE     Status: None   Collection Time    03/07/13  2:22 AM  Result Value Ref Range Status   Specimen Description URINE, CATHETERIZED   Final   Special Requests Immunocompromised   Final   Culture  Setup Time     Final   Value: 03/07/2013 11:47     Performed at [facility]     Final   Value: NO GROWTH     Performed at [facility]   Culture     Final   Value: NO GROWTH     Performed at [facility]   Report Status 03/08/2013 FINAL   Final  CULTURE, ROUTINE-ABSCESS     Status: None   Collection Time    03/07/13  2:46 PM      Result Value Ref Range Status   Specimen Description ABSCESS   Final   Special Requests PELVIC   Final   Gram Stain     Final   Value: MODERATE WBC PRESENT, PREDOMINANTLY PMN     NO SQUAMOUS EPITHELIAL CELLS SEEN     FEW GRAM NEGATIVE RODS     Performed at [facility]   Culture     Final   Value: NO GROWTH 3 DAYS     Performed at [facility]   Report Status 03/10/2013 FINAL   Final  ANAEROBIC CULTURE     Status: None   Collection Time    03/07/13  2:46 PM      Result Value Ref Range Status   Specimen Description ABSCESS   Final   Special Requests PELVIC   Final   Gram Stain PENDING   Incomplete   Culture     Final   Value: NO ANAEROBES ISOLATED; CULTURE IN PROGRESS FOR 5 DAYS     Performed at [facility]   Report Status PENDING   Incomplete     Studies: Dg Chest Port 1 View  03/08/2013   CLINICAL DATA:  Respiratory failure. Elevated white blood cell count.  EXAM: PORTABLE CHEST - 1 VIEW  COMPARISON:  03/07/2013  FINDINGS:  There is opacity at the right lung base. This suggests a small effusion with either atelectasis or infiltrate.  Retrocardiac opacity noted on the prior day study is less apparent. The difference is likely due to differences in patient positioning and technique.  Lungs are hyperexpanded but otherwise clear. No evidence of a left effusion. No pneumothorax. Cardiac silhouette is normal in size. Left PICC is stable and well positioned.  IMPRESSION: 1. New right lung base hazy opacity likely a combination of a small effusion with either atelectasis or infiltrate. 2. Previously described retrocardiac opacity is less apparent. 3. No other change.   Electronically Signed   By: Lajean Manes M.D.   On: 03/08/2013 17:00    Scheduled Meds: . collagenase   Topical Daily  . feeding supplement (GLUCERNA SHAKE)  237 mL Oral Q24H  . folic acid  1 mg Oral Daily  . iron polysaccharides  150 mg Oral Daily  . multivitamin with minerals  1 tablet Oral Daily  . piperacillin-tazobactam (ZOSYN)  IV  3.375 g Intravenous 3 times per day  . senna-docusate  1 tablet Oral QHS  . sodium chloride  10 mL Intravenous Q12H  . thiamine  100 mg Oral Daily  . vancomycin  1,000 mg Intravenous Q12H   Continuous Infusions: . 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 If 7PM-7AM, please contact night-coverage at [email or web address], password [email or web address] 03/10/2013, 12:25 PM  LOS: 4 days

## 2013-03-10 NOTE — Progress Notes (Signed)
CSW has reviewed BSW intern's psychosocial assessment. CSW is familiar with patient from previous admission. CSW witnessed intern's conversation with daughter. CSW will follow for DC needs.  Liz Beach, [place], [place], [phone]

## 2013-03-10 NOTE — Progress Notes (Signed)
Physical Therapy Wound Treatment Patient Details  Name: Jason [name] MRN: [id number] Date of Birth: 1945/09/05  Today's Date: 03/10/2013 Time: 1157-2620 Time Calculation (min): 46 min  Subjective  Subjective: "No," pt stated when asked if he was having any pain with hydrotherapy. Patient and Family Stated Goals: Not stated  Pain Score:  Pt denied pain.  Wound Assessment  Pressure Ulcer Unstageable - Full thickness tissue loss in which the base of the ulcer is covered by slough (yellow, tan, gray, green or brown) and/or eschar (tan, brown or black) in the wound bed. (Active)  Dressing Type Moist to dry;Foam;Gauze (Comment);Barrier Film (skin prep);Other (Comment) 03/10/2013 10:55 AM  Dressing Changed 03/10/2013 10:55 AM  Dressing Change Frequency Daily 03/10/2013 10:55 AM  State of Healing Eschar 03/10/2013 10:08 AM  Site / Wound Assessment Brown;Black;Yellow;Pink 03/10/2013 10:08 AM  % Wound base Red or Granulating 5% 03/10/2013 10:08 AM  % Wound base Yellow 20% 03/10/2013 10:08 AM  % Wound base Black 75% 03/10/2013 10:08 AM  % Wound base Other (Comment) 0% 03/10/2013 10:08 AM  Peri-wound Assessment Intact 03/10/2013 10:08 AM  Wound Length (cm) 10.5 cm 03/10/2013 10:08 AM  Wound Width (cm) 9.5 cm 03/10/2013 10:08 AM  Wound Depth (cm) 0.2 cm 03/10/2013 10:08 AM  Margins Unattached edges (unapproximated) 03/09/2013  8:00 AM  Drainage Amount Minimal 03/10/2013 10:08 AM  Drainage Description Serous 03/10/2013 10:08 AM  Treatment Debridement (Selective);Hydrotherapy (Ultrasonic mist);Packing (Saline gauze) 03/10/2013 10:55 AM   Hydrotherapy Ultrasonic mist  - wound location: sacrum Ultrasonic mist at 35KHz (+/-3KHz) at ___ percent: 100 % Ultrasonic mist therapy minutes: 8 min Selective Debridement Selective Debridement - Location: sacrum Selective Debridement - Tools Used: Scalpel Selective Debridement - Tissue Removed: scored eschar prior to application of santyl   Wound Assessment and Plan   Wound Therapy - Assess/Plan/Recommendations Wound Therapy - Clinical Statement: Pt known to hydrotherapy from previous admission. Presents to hydrotherapy with unstageable sacral ulcer. Can benefit from hydrotherapy to decrease eschar and promote healing. Will begin with ultrasonic mist to try and decrease adherent eschar with possible change to pulsatile lavage later on. Wound Therapy - Functional Problem List: decr sitting tolerance. Factors Delaying/Impairing Wound Healing: Incontinence;Immobility;Multiple medical problems;Polypharmacy;Tobacco use Hydrotherapy Plan: Debridement;Dressing change;Patient/family education;Ultrasonic wound therapy $RemoveBefor'@[id number]$  KHz (+/- 3 KHz) Wound Therapy - Frequency: 6X / week Wound Therapy - Follow Up Recommendations: Skilled nursing facility Wound Plan: See plan  Wound Therapy Goals- Improve the function of patient's integumentary system by progressing the wound(s) through the phases of wound healing (inflammation - proliferation - remodeling) by: Decrease Necrotic Tissue to: 80% Decrease Necrotic Tissue - Progress: Goal set today Increase Granulation Tissue to: 20% Increase Granulation Tissue - Progress: Goal set today Goals/treatment plan/discharge plan were made with and agreed upon by patient/family: No, Patient unable to participate in goals/treatment/discharge plan and family unavailable Time For Goal Achievement: 7 days Wound Therapy - Potential for Goals: Fair  Goals will be updated until maximal potential achieved or discharge criteria met.  Discharge criteria: when goals achieved, discharge from hospital, MD decision/surgical intervention, no progress towards goals, refusal/missing three consecutive treatments without notification or medical reason.  GP     Yisroel Mullendore 03/10/2013, 11:18 AM  [place]

## 2013-03-10 NOTE — Progress Notes (Signed)
This is a 68 year old male well-known to urology who we are asked to consult on by Dr. Linton Flemings, M.D. for evaluation and management of pelvic fluid collection.  History of present illness: Jason [name] has a history of bladder cancer and is status post TURBT which was complicated by a bladder perforation. He was discharged with a Foley catheter which was inadvertently removed by himself. He subsequently presented to Jason emergency department was found to have a necrotizing pelvic infection. He was taken to Jason operating room where he was explored and washed out. Several drains were left. He improved and his drains were slowly being backed out. However, Jason left pelvic drain was pulled out inadvertently by Jason [name] earlier than anticipated. Jason [name] was discharged to a skilled nursing facility on IV antibiotics. 3 days prior to his current presentation to Jason emergency department he underwent a cystogram in Jason urology office. This showed a persistent leak Jason left lateral wall Jason bladder. Jason Foley catheter was changed and Jason [name] was scheduled for followup in 2 weeks. Jason [name] was brought to Jason emergency department early last evening with a fever. He was noted to have a fever of 102.2F, a white blood cell count of 27,000, and his CT scan revealed a large left-sided pelvic fluid collection/abscess. Jason Foley catheter was noted to be pulled out into Jason urethra, and it was removed and replaced in Jason emergency department. Clear yellow urine was returned. He was admitted to Jason step down unit.  Interval: [name] looks and feels better Fluid from aspirate of pelvis is sterile - (urine sterile from office visit on 10/4) JP creatinine elevated = JP drainout put urine. No complaints this AM  [name] Active Problem List   Diagnosis Date Noted  . Acute encephalopathy 03/07/2013  . Postoperative intra-abdominal abscess 03/07/2013  . Hydronephrosis, bilateral 03/07/2013  . Pressure ulcer,  stage [id number]) 03/07/2013  . UTI (lower urinary tract infection) 02/18/2013  . Sepsis 02/18/2013  . Aspiration pneumonia 02/18/2013  . Acute respiratory failure with hypoxia 02/18/2013  . Acute renal failure 02/18/2013  . Dementia 02/18/2013  . Hematuria 02/18/2013  . 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 Memory loss 03/27/2009  . CELLULITIS, HAND, LEFT 03/23/2009  . FATIGUE 03/15/2009  . ALCOHOL ABUSE 01/30/2008  . GOITER 10/25/2006  . NICOTINE ADDICTION 10/25/2006  . HYPERTENSION 10/25/2006  . COLONIC POLYPS, HX OF 10/25/2006  . BENIGN PROSTATIC HYPERTROPHY, HX OF 10/25/2006   No current facility-administered medications on file prior to encounter.   Current Outpatient Prescriptions on File Prior to Encounter  Medication Sig Dispense Refill  . acetaminophen (TYLENOL) 325 MG tablet Take 2 tablets (650 mg total) by mouth every 6 (six) hours as needed for mild pain (or Fever >/= 101).      . collagenase (SANTYL) ointment Apply topically daily. Apply to sacral wound daily.  15 g  0  . dextrose 5 % SOLN 50 mL with cefTRIAXone 1 G SOLR 1 g Inject 1 g into Jason vein daily. For 4 weeks from 02/19/13.      Marland Kitchen docusate sodium (COLACE) 100 MG capsule Take 1 capsule (100 mg total) by mouth 2 (two) times daily as needed (take to keep stool soft.).  60 capsule  0  . folic acid (FOLVITE) 1 MG tablet Take 1 tablet (1 mg total) by mouth daily.      . insulin aspart (NOVOLOG) 100 UNIT/ML injection 0-9 Units, Subcutaneous, 3 times daily before meals & bedtime CBG < 70: implement hypoglycemia  protocol CBG 70 - 120: 0 units CBG 121 - 150: 1 unit CBG 151 - 200: 2 units CBG 201 - 250: 3 units CBG 251 - 300: 5 units CBG 301 - 350: 7 units CBG 351 - 400: 9 units CBG > 400  10 mL  11  . iron polysaccharides (NIFEREX) 150 MG capsule Take 1 capsule (150 mg total) by mouth daily.      . metroNIDAZOLE (FLAGYL) 500 MG tablet Take 1 tablet (500 mg total) by mouth every 8 (eight) hours. For 4 weeks from 02/19/13       . Multiple Vitamin (MULTIVITAMIN WITH MINERALS) TABS tablet Take 1 tablet by mouth daily.      . predniSONE (DELTASONE) 10 MG tablet Take 2 tablets (20 mg) daily for 2 days, then, Take 1 tablets (10 mg) daily for 2 days, then, Take 0.5 tablets (5 mg) daily for 1 days, then stop  19 tablet  0  . thiamine 100 MG tablet Take 1 tablet (100 mg total) by mouth daily.      . feeding supplement, GLUCERNA SHAKE, (GLUCERNA SHAKE) LIQD Take 237 mLs by mouth daily.    0   Past Medical History  Diagnosis Date  . Hypertension   . Stroke     Dementia, otherwise, no residual  . Bladder mass 02-10-13    surgery planned for this  . 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 COPD (chronic obstructive pulmonary disease)     previous history and current smoking  . 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 Dementia 02/18/2013   Past Surgical History  Procedure Laterality Date  . Transurethral resection of bladder tumor with gyrus (turbt-gyrus) N/A 02/13/2013    Procedure: TRANSURETHRAL RESECTION OF BLADDER TUMOR WITH GYRUS (TURBT-GYRUS), bladder biopsies;  Surgeon: Ardis Hughs, MD;  Location: [facility];  Service: Urology;  Laterality: N/A;  . Cystoscopy/retrograde/ureteroscopy Bilateral 02/13/2013    Procedure: BILATERAL RETROGRADE;  Surgeon: Ardis Hughs, MD;  Location: [facility];  Service: Urology;  Laterality: Bilateral;  . Laparotomy N/A 02/18/2013    Procedure: EXPLORATORY LAPAROTOMY drainage of retroperitoneal abscess, drainage of Pre-Peritoneal abscess and Exploration of bladder perforation;  Surgeon: Imogene Burn. Tsuei, MD;  Location: [facility];  Service: General;  Laterality: N/A;   PE: Filed Vitals:   03/09/13 1702 03/09/13 1958 03/09/13 2328 03/10/13 0346  BP: 148/78 144/69 153/78 159/81  Pulse: 79 74 68 71  Temp: 98.4 F (36.9 C) 98.2 F (36.8 C) 98.5 F (36.9 C) 98.7 F (37.1 C)  TempSrc: Oral Oral Oral Oral  Resp: 22 20 20 20   Height: 6' (1.829 m)     Weight: 66.452  kg (146 lb 8 oz)     SpO2: 95% 97% 96% 97%    Intake/Output Summary (Last 24 hours) at 03/10/13 0748 Last data filed at 03/10/13 0559  Gross per 24 hour  Intake 5508.75 ml  Output   3980 ml  Net 1528.75 ml   No acute distress, sleeping but arousable Abdomen: Lower Midline incision is open with some granulation tissue, fascial sutures are exposed, there is no surrounding erythema and no purulence.  Pigtail drain was placed in Jason left lower quadrant, insertion site is without surrounding erythema her induration, fluid is straw-colored. GU: Jason Foley is draining clearly urine is no scrotal edema. Psych: Flat affect, one line/word responses, appears to be his baseline Skin: Large ~ 6 cm x 4 cm sacral decubitus which distress with a Mediplex dressing.  Recent Labs  03/08/13 1317 03/09/13 0350  WBC 12.0* 9.3  HGB 10.1* 10.2*  HCT 29.9* 30.7*    Recent Labs  03/08/13 1317 03/09/13 0350  NA 140 140  K 4.3 3.9  CL 103 104  CO2 22 23  GLUCOSE 52* 51*  BUN 12 11  CREATININE 0.97 0.84  CALCIUM 8.1* 7.9*   No results found for this basename: LABPT, INR,  in Jason last 72 hours No results found for this basename: LABURIN,  in Jason last 72 hours Results for orders placed during Jason hospital encounter of 03/06/13  CULTURE, BLOOD (ROUTINE X 2)     Status: None   Collection Time    03/06/13 11:35 PM      Result Value Ref Range Status   Specimen Description BLOOD RIGHT FOREARM   Final   Special Requests BOTTLES DRAWN AEROBIC AND ANAEROBIC 10CC   Final   Culture  Setup Time     Final   Value: 03/07/2013 03:30     Performed at [facility]   Culture     Final   Value:        BLOOD CULTURE RECEIVED NO GROWTH TO DATE CULTURE WILL BE HELD FOR 5 DAYS BEFORE ISSUING A FINAL NEGATIVE REPORT     Performed at [facility]   Report Status PENDING   Incomplete  CULTURE, BLOOD (ROUTINE X 2)     Status: None   Collection Time    03/07/13 12:26 AM      Result Value Ref Range  Status   Specimen Description BLOOD LEFT PICC LINE   Final   Special Requests BOTTLES DRAWN AEROBIC AND ANAEROBIC 10CC EACH   Final   Culture  Setup Time     Final   Value: 03/07/2013 11:51     Performed at [facility]   Culture     Final   Value:        BLOOD CULTURE RECEIVED NO GROWTH TO DATE CULTURE WILL BE HELD FOR 5 DAYS BEFORE ISSUING A FINAL NEGATIVE REPORT     Performed at [facility]   Report Status PENDING   Incomplete  URINE CULTURE     Status: None   Collection Time    03/07/13  2:22 AM      Result Value Ref Range Status   Specimen Description URINE, CATHETERIZED   Final   Special Requests Immunocompromised   Final   Culture  Setup Time     Final   Value: 03/07/2013 11:47     Performed at [facility]     Final   Value: NO GROWTH     Performed at [facility]   Culture     Final   Value: NO GROWTH     Performed at [facility]   Report Status 03/08/2013 FINAL   Final  CULTURE, ROUTINE-ABSCESS     Status: None   Collection Time    03/07/13  2:46 PM      Result Value Ref Range Status   Specimen Description ABSCESS   Final   Special Requests PELVIC   Final   Gram Stain     Final   Value: MODERATE WBC PRESENT, PREDOMINANTLY PMN     NO SQUAMOUS EPITHELIAL CELLS SEEN     FEW GRAM NEGATIVE RODS     Performed at [facility]   Culture     Final   Value: NO  GROWTH 2 DAYS     Performed at [facility]   Report Status PENDING   Incomplete  ANAEROBIC CULTURE     Status: None   Collection Time    03/07/13  2:46 PM      Result Value Ref Range Status   Specimen Description ABSCESS   Final   Special Requests PELVIC   Final   Gram Stain PENDING   Incomplete   Culture     Final   Value: NO ANAEROBES ISOLATED; CULTURE IN PROGRESS FOR 5 DAYS     Performed at [facility]   Report Status PENDING   Incomplete   CT, ab/pelv: IMPRESSION:  1. Recurrent or persistent abdominopelvic abscess,  measuring up to  11 x 8 x 7 cm. Jason abscess communicates with Jason bladder lumen or  bladder wall on Jason left (presumably Jason site of recent tumor  resection).  2. Low-density expansion of Jason posterior penile urethra, likely  malpositioned Foley catheter with inflated balloon. Correlate with  exam.  3. Bilateral mild hydronephrosis from bladder distention or mass  effect in Jason pelvis.  Imp:  #1 Left-sided pelvic fluid collection -Sterile and consistent with urine #2 large sacral decubitus ulcer #3 persistent extraperitoneal bladder perforation  Recommendation: Will take drain off suction, have spoken with IR and requested exchanging JP bulb to bag Continue foley catheter Consider ID consult for abx choice and duration Will order midline dressing changes BID  We will continue to follow.

## 2013-03-10 NOTE — Progress Notes (Signed)
Addendum  Patient seen and examined, chart and data base reviewed.  I agree with the above assessment and plan.  For full details please see Mrs. Imogene Burn PA note.  Sepsis likely secondary to pelvic abscess and also pneumonia.  Currently on Zosyn and vancomycin, ID for recommendation.   Birdie Hopes, MD [facility] Pager: [phone] 03/10/2013, 12:50 PM

## 2013-03-10 NOTE — Progress Notes (Signed)
Clinical Social Work Department BRIEF PSYCHOSOCIAL ASSESSMENT 03/10/2013  Patient:  Jason [name], Jason [name]     Account Number:  [id number]     Admit date:  03/06/2013  Clinical Social Worker:  Lovey Newcomer  Date/Time:  03/10/2013 02:56 PM  Referred by:  Physician  Date Referred:  03/10/2013 Referred for  SNF Placement   Other Referral:   Interview type:  Family Other interview type:   BSW student spoke with Pt's daughter Jason [name] [phone]) over the phone.    PSYCHOSOCIAL DATA Living Status:  FACILITY Admitted from facility:  [facility] Level of care:  [place] Primary support name:  Jason [name] [phone] Primary support relationship to patient:  CHILD, ADULT Degree of support available:   Pt has great support  system from his family.    CURRENT CONCERNS Current Concerns  Post-Acute Placement   Other Concerns:    SOCIAL WORK ASSESSMENT / PLAN BSW student went to Pt room to discuss d/c planning. Due to Pt's level of orientation, BSW student had to speak to Pt's daughter Jason [name]. While BSW student was in Pt's room, a representive from [place] Levada Dy [phone]) came into the room to speak with the Pt. Levada Dy informed BSW student about [facility] ([facility] and [facility])  the Pt will be able to choose from since Pt stated that he did not want to go back to [place].    BSW student called Pt's daughter to inform her about Pt's d/c planning. Pt's daughter also informed BSW student that Pt will not be going back to [place]. BSW student informed Pt's daughter about the two SNF's that [facility] is contracted with  to ask her which one would she like for the Pt to go to. Pt's daughter states that [facility] would be the best place for Pt. Pt also stated that he would like to go to [facility].    BSW student informed Pt's daughter that BSW student will send Pt's information out to the  contracted SNF in  order to see if a bed is available to take care of Pt d/c needs.   Assessment/plan status:  Psychosocial Support/Ongoing Assessment of Needs Other assessment/ plan:   BSW student will follow up with d/c planning.   Information/referral to community resources:   Gave CSW contact information to daughter.    PATIENT'S/FAMILY'S RESPONSE TO PLAN OF CARE: Pt's daughter was very Patent attorney of services given by [employer]. Pt's daughter asked BSW student when will Pt be d/c. CSW will follow-up with bed offers and keep daughter updated.       [place] BSW-Intern [facility] [phone]

## 2013-03-10 NOTE — Clinical Social Work Placement (Deleted)
Clinical Social Work Department CLINICAL SOCIAL WORK PLACEMENT NOTE 03/10/2013  Patient:  Jason [name]  Account Number:  [id number] Admit date:  03/05/2013  Clinical Social Worker:  Lovey Newcomer  Date/time:  03/10/2013 02:34 PM  Clinical Social Work is seeking post-discharge placement for this patient at the following level of care:   [place]   (*CSW will update this form in Epic as items are completed)   03/09/2013  Patient/family provided with [place] Department of Clinical Social Work's list of facilities offering this level of care within the geographic area requested by the patient (or if unable, by the patient's family).  03/09/2013  Patient/family informed of their freedom to choose among providers that offer the needed level of care, that participate in Medicare, Medicaid or managed care program needed by the patient, have an available bed and are willing to accept the patient.  03/09/2013  Patient/family informed of [facility]' ownership interest in [facility], as well as of the fact that they are under no obligation to receive care at this facility.  PASARR submitted to [facility] on  PASARR number received from [facility] on   FL2 transmitted to all facilities in geographic area requested by pt/family on  03/09/2013 FL2 transmitted to all facilities within larger geographic area on   Patient informed that his/her managed care company has contracts with or will negotiate with  certain facilities, including the following:     Patient/family informed of bed offers received:  03/10/2013 Patient chooses bed at [facility] Physician recommends and patient chooses bed at    Patient to be transferred to [facility] on  03/10/2013 Patient to be transferred to facility by sister's personal vehicle  The following physician request were entered in Epic:   Additional Comments: Per MD patient ready to Dc to [facility]. RN,  patient, family, and facility notified of discharge. Patient will be transported by sister. RN given number for report. Sealed Dc packet given to patient with instruction to give to facility. CSW signing off.   Liz Beach, [place], [place], [phone]

## 2013-03-10 NOTE — Consult Note (Signed)
WOC wound follow up Wound type: Unstageable POA Pressure Ulcer Measurement: 9.5cm x 10.5cm x 0.2cm  Wound bed:90% yellow/black soft eschar, fairly adherent to wound base. Not flucuent under the eschar that I can tell. 10% pink at wound edges with some noted epithelial buds.  Periwound:intact Dressing procedure/placement/frequency: enzymatic debridement ointment, moist gauze and foam. Hydrotherapy daily m-Sat.  Air mattress in place.   WOC will follow along with weekly wound assessment and contact with PT PRN for updates on wound status Floreine Kingdon Liane Comber RN,CWOCN [phone]

## 2013-03-10 NOTE — Progress Notes (Signed)
  Subjective: LLQ abscess drain placed 3/7 Pt doing better Up in bed; pleasant. Urology note reviewed   Objective: Vital signs in last 24 hours: Temp:  [98.2 F (36.8 C)-98.8 F (37.1 C)] 98.7 F (37.1 C) (03/10 0346) Pulse Rate:  [68-87] 71 (03/10 0346) Resp:  [20-24] 20 (03/10 0346) BP: (134-159)/(66-81) 159/81 mmHg (03/10 0346) SpO2:  [95 %-98 %] 97 % (03/10 0346) Weight:  [146 lb 8 oz (66.452 kg)] 146 lb 8 oz (66.452 kg) (03/09 1702) Last BM Date: 03/09/13   PE:  Afeb; vss Output 348mL yesterday, looks serous but positive for Cr c/w urine Site clean and dry; NT No growth on cx   Lab Results:   Recent Labs  03/08/13 1317 03/09/13 0350  WBC 12.0* 9.3  HGB 10.1* 10.2*  HCT 29.9* 30.7*  PLT 104* 111*   BMET  Recent Labs  03/08/13 1317 03/09/13 0350  NA 140 140  K 4.3 3.9  CL 103 104  CO2 22 23  GLUCOSE 52* 51*  BUN 12 11  CREATININE 0.97 0.84  CALCIUM 8.1* 7.9*   PT/INR No results found for this basename: LABPROT, INR,  in the last 72 hours ABG No results found for this basename: PHART, PCO2, PO2, HCO3,  in the last 72 hours  Studies/Results: Dg Chest Port 1 View  03/08/2013   CLINICAL DATA:  Respiratory failure. Elevated white blood cell count.  EXAM: PORTABLE CHEST - 1 VIEW  COMPARISON:  03/07/2013  FINDINGS: There is opacity at the right lung base. This suggests a small effusion with either atelectasis or infiltrate.  Retrocardiac opacity noted on the prior day study is less apparent. The difference is likely due to differences in patient positioning and technique.  Lungs are hyperexpanded but otherwise clear. No evidence of a left effusion. No pneumothorax. Cardiac silhouette is normal in size. Left PICC is stable and well positioned.  IMPRESSION: 1. New right lung base hazy opacity likely a combination of a small effusion with either atelectasis or infiltrate. 2. Previously described retrocardiac opacity is less apparent. 3. No other change.    Electronically Signed   By: Lajean Manes M.D.   On: 03/08/2013 17:00    Anti-infectives: Anti-infectives   Start     Dose/Rate Route Frequency Ordered Stop   03/10/13 1300  vancomycin (VANCOCIN) IVPB 1000 mg/200 mL premix     1,000 mg 200 mL/hr over 60 Minutes Intravenous Every 12 hours 03/10/13 0243     03/07/13 0800  piperacillin-tazobactam (ZOSYN) IVPB 3.375 g     3.375 g 12.5 mL/hr over 240 Minutes Intravenous 3 times per day 03/07/13 0105     03/07/13 0115  vancomycin (VANCOCIN) 1,250 mg in sodium chloride 0.9 % 250 mL IVPB  Status:  Discontinued     1,250 mg 166.7 mL/hr over 90 Minutes Intravenous Every 24 hours 03/07/13 0105 03/10/13 0243   03/07/13 0115  piperacillin-tazobactam (ZOSYN) IVPB 3.375 g     3.375 g 12.5 mL/hr over 240 Minutes Intravenous  Once 03/07/13 0105 03/07/13 0207      Assessment/Plan: s/p * No surgery found *  Pelvic abscess LLQ drain placed 3/7 Cont drain, have removed bulb and replaced with gravity bag    LOS: 4 days    Ascencion Dike 03/10/2013

## 2013-03-10 NOTE — Progress Notes (Signed)
Patient ID: Jason [name], male   DOB: March 07, 1945, 68 y.o.   MRN: [id number]         [facility]    Date of Admission:  03/06/2013   Total days of antibiotics 21       Day 5 vancomycin       Day 5 piperacillin tazobactam  Principal Problem:   Sepsis Active Problems:   NICOTINE ADDICTION   HYPERTENSION   Acute respiratory failure with hypoxia   Dementia   Hematuria   Urothelial carcinoma   Acute encephalopathy   Postoperative intra-abdominal abscess   Hydronephrosis, bilateral   Pressure ulcer, stage BOF(751.02)   . collagenase   Topical Daily  . feeding supplement (GLUCERNA SHAKE)  237 mL Oral Q24H  . folic acid  1 mg Oral Daily  . iron polysaccharides  150 mg Oral Daily  . multivitamin with minerals  1 tablet Oral Daily  . piperacillin-tazobactam (ZOSYN)  IV  3.375 g Intravenous 3 times per day  . senna-docusate  1 tablet Oral QHS  . sodium chloride  10 mL Intravenous Q12H  . thiamine  100 mg Oral Daily  . vancomycin  1,000 mg Intravenous Q12H   Objective: Temp:  [98.2 F (36.8 C)-98.8 F (37.1 C)] 98.5 F (36.9 C) (03/10 1348) Pulse Rate:  [65-96] 96 (03/10 1348) Resp:  [18-22] 18 (03/10 1348) BP: (121-159)/(69-82) 121/75 mmHg (03/10 1348) SpO2:  [95 %-97 %] 97 % (03/10 1348) Weight:  [66.452 kg (146 lb 8 oz)] 66.452 kg (146 lb 8 oz) (03/09 1702)  He is alert and in no distress sitting up in a chair  330 cc out of his JP drain yesterday  Recent blood and urine cultures are negative Pelvic fluid collection Gram stain showed gram-negative rods blood cultures are negative  Assessment: Jason [name] developed Escherichia coli bacteremia and a pelvic abscess after traumatic removal of his Foley catheter last month. He was discharged home on March 2 to complete 4 weeks of antibiotic therapy with IV ceftriaxone and oral metronidazole. He developed fever, chills confusion leading to readmission on March 7. A new pelvic fluid collection was noted  that was in communication with the bladder. There was also question of a very faint, patchy left lower lobe infiltrate. His antibiotic therapy was broadened. He has defervesced and improved. It is unclear whether the new pelvic fluid collection was a recurrent abscess or simply extravasation of urine from his bladder. I recommend changing to once daily ertapenem and treating at least one more week.  Plan: 1. Change to vancomycin and piperacillin tazobactam to IV or ertapenem 1 g daily. Treat at least for one more week when he has a followup visit in our clinic with Dr. Tommy Medal 2. Please notify skilled nursing facility that he has a followup visit in our clinic on 03/17/2013 at 2 PM  [name], [place] for [place] [phone] pager   [phone] cell 03/10/2013, 2:09 PM

## 2013-03-10 NOTE — Progress Notes (Signed)
ANTIBIOTIC CONSULT NOTE - FOLLOW UP  Pharmacy Consult for Vancomycin  Indication: rule out sepsis Labs:  Recent Labs  03/07/13 0555 03/08/13 1317 03/09/13 0350  WBC 31.9* 12.0* 9.3  HGB 11.5* 10.1* 10.2*  PLT 119* 104* 111*  CREATININE 1.71* 0.97 0.84   Estimated Creatinine Clearance: 78.1 ml/min (by C-G formula based on Cr of 0.84).  Recent Labs  03/10/13 0057  VANCOTROUGH 6.3*    Assessment: 68 y/o M on vancomycin for r/o sepsis, abdominal wounds, sacral ulcer, renal function appears to have improved over the last 2 days, VT is 6.3 this AM.   Goal of Therapy:  Vancomycin trough level 15-20 mcg/ml  Plan:  -Increase vancomycin to 1000 mg IV q12h -Trend WBC, temp, renal function  -Repeat VT at steady state   Narda Bonds 03/10/2013,2:39 AM

## 2013-03-11 ENCOUNTER — Inpatient Hospital Stay
Admission: RE | Admit: 2013-03-11 | Discharge: 2013-05-04 | Disposition: A | Discharge status: Home / Self Care | Payer: Medicare (Managed Care) | Source: Ambulatory Visit | Attending: Family Medicine | Admitting: Family Medicine

## 2013-03-11 DIAGNOSIS — R29810 Facial weakness: Principal | ICD-10-CM

## 2013-03-11 LAB — CBC
HCT: 33.6 % — ABNORMAL LOW (ref 39.0–52.0)
Hemoglobin: 11.2 g/dL — ABNORMAL LOW (ref 13.0–17.0)
MCH: 23.4 pg — AB (ref 26.0–34.0)
MCHC: 33.3 g/dL (ref 30.0–36.0)
MCV: 70.3 fL — ABNORMAL LOW (ref 78.0–100.0)
PLATELETS: 122 10*3/uL — AB (ref 150–400)
RBC: 4.78 MIL/uL (ref 4.22–5.81)
RDW: 14.8 % (ref 11.5–15.5)
WBC: 7.4 10*3/uL (ref 4.0–10.5)

## 2013-03-11 MED ORDER — HEPARIN SOD (PORK) LOCK FLUSH 100 UNIT/ML IV SOLN
250.0000 [IU] | Freq: Every day | INTRAVENOUS | Status: DC
  Filled 2013-03-11: qty 3

## 2013-03-11 MED ORDER — HEPARIN SOD (PORK) LOCK FLUSH 100 UNIT/ML IV SOLN
250.0000 [IU] | INTRAVENOUS | Status: DC | PRN
  Administered 2013-03-11: 250 [IU]
  Filled 2013-03-11: qty 3

## 2013-03-11 MED ORDER — ERTAPENEM SODIUM 1 G IJ SOLR: 1.0000 g | INTRAMUSCULAR | Status: DC

## 2013-03-11 MED ORDER — SODIUM CHLORIDE 0.9 % IJ SOLN
10.0000 mL | INTRAMUSCULAR | Status: DC | PRN
  Administered 2013-03-11: 10 mL

## 2013-03-11 MED ORDER — OXYCODONE HCL 5 MG PO TABS: 5.0000 mg | ORAL_TABLET | ORAL | Status: DC | PRN

## 2013-03-11 MED ORDER — SODIUM CHLORIDE 0.9 % IJ SOLN: 10.0000 mL | Freq: Two times a day (BID) | INTRAMUSCULAR | Status: DC

## 2013-03-11 MED ORDER — SENNOSIDES-DOCUSATE SODIUM 8.6-50 MG PO TABS: 1.0000 | ORAL_TABLET | Freq: Every day | ORAL | Status: DC

## 2013-03-11 MED ORDER — ALBUTEROL SULFATE (2.5 MG/3ML) 0.083% IN NEBU: 2.5000 mg | INHALATION_SOLUTION | RESPIRATORY_TRACT | Status: DC | PRN

## 2013-03-11 NOTE — Progress Notes (Signed)
Pt being discharged to [facility]. Report called to [facility]. All questions answered. Packet to be sent with [facility] representative. Pt alert/oriented x3, no complaints of pain. Foley in place and intact. Right upper arm picc line intact. Foam dressing to sacrum (unstageable pressure ulcer), midline abdominal wound (clean,dry, and intact) Right elbow abrasion open to air. Vitals stable. Will continue to monitor until patient leaves unit. [place], [employer]

## 2013-03-11 NOTE — Discharge Summary (Signed)
Physician Discharge Summary  Jason [name] [id number] DOB: [birth year] DOA: 03/06/2013  PCP: [name], MD  Admit date: 03/06/2013 Discharge date: 03/11/2013  Time spent: 50 minutes  Recommendations for Outpatient Follow-up:   D/C to SNF  Continue BID wet-to-dry midline dressing changes   Leave pelvic drain to gravity drainage   Continue foley catheter   F/u with Dr. Louis Meckel of urology on 03/26/13 at 1:15pm.   Ertapenum 1g IV daily till seen by ID on 3/17  F/u with Dr. Tommy Medal of Infectious Disease on 3/17 at 2 pm.   Discharge Diagnoses:  Principal Problem:   Sepsis Active Problems:   NICOTINE ADDICTION   HYPERTENSION   Acute respiratory failure with hypoxia   Dementia   Hematuria   Urothelial carcinoma   Acute encephalopathy   Postoperative intra-abdominal abscess   Hydronephrosis, bilateral   Pressure ulcer, stage [id number]   Discharge Condition: stable.  Diet recommendation: regular diet.  Filed Weights   03/07/13 0430 03/07/13 1831 03/09/13 1702  Weight: 66.1 kg (145 lb 11.6 oz) 64.4 kg (141 lb 15.6 oz) 66.452 kg (146 lb 8 oz)    History of present illness:  Jason [name] is a 68 yo male with a PMH of CVA, COPD, Dementia, Urothelial Carcinoma underwent resection of his urothelial tumor 2/13.  Unfortunately his bladder was perforated.  The patient subsequently pulled out his foley cath creating more trauma to the area.  He then developed necrotizing fascitis with pelvic abscess and required exploratory laparoscopy with drainage of the abscess and wash out on 02/18/2013.  During that hospitalization he was found to have Ecoli bacteremia and placed on long term IV antibiotics.  He was D/C'd to SNF on 3/2.  At SNF the patient pulled out a drain and his foley became displaced.  His urine leak accumulated in his pelvis and he was readmitted on 3/7 with AMS, Sepsis and a pelvic abscess that appears to have been a collection of urine.  The day after admission  he developed acute respiratory failure - likely aspiration pneumonia.  Hospital Course:  Sepsis with acute encephalopathy  Resolved.  Likely secondary to Pelvic abscess and possible Pneumonia  Current Blood cultures are negative to date.   Pelvic Abscess  Abscess culture felt to be consistent with urine from a bladder leak.  Patient removed his own drain at SNF after pelvic surgery  Drain replaced by IR  Being managed by Urology  ID Recommends 7 days of Invanz followed by an office visit with Dr. Tommy Medal.  Acute Renal Failure with UTI  U/A appeared infected.  Creatinine has returned to baseline after drain replaced and antibiotics started  Urine culture shows no growth   Recent urothelial carcinoma s/p resection  Unfortunately complicated by bladder perforation and further complicated by patient pulling out his cath.  He subsequently developed necrotizing fascitis requiring exp lap drainage of abscess and wash out.  Patient with foley and LLQ abscess drain in place  Being managed by Dr. Herrick-Urology  Recent Ecoli Bacteremia  Placed on Rocephin and Flagyl via PICC for 4 weeks starting 3/1.  PICC was placed on 2/26.  Changed to Vanc / Zosyn on admission 3/7.  Then changed to [place] on 3/10. ID recommends Ivanz X 7 additional days.  Then follow up in ID clinic with Dr. Tommy Medal.  Acute respiratory distress  ?HCAP vs Aspiration event during encephalopathic period.  Patient experienced acute respiratory distress on 3/8 requiring venturi facemask.  Symptoms appear completely resolved. No  respiratory distress. Satting well on room air.  Will continue on antibiotics.  Sacral Decub  9.5cm x 10.5cm x 0.2cm Unstageable Pressure Ulcer  Dressing procedure: enzymatic debridement ointment, moist gauze and foam.  Hydrotherapy daily m-Sat. As an inpatient. Air mattress in place.  Appreciate wound care consultation   Microcytic anemia with Hematuria  Will check ferritin and TIBC  On  Iron polysaccharide.   Procedures: Foley was removed and replaced in the ED on the day of admission. CT Guided Left Abscess Drainage and drain placement.  Consultations:  Urology  IR  Wound Care  Discharge Exam: Filed Vitals:   03/11/13 0400  BP: 129/63  Pulse: 85  Temp: 98.5 F (36.9 C)  Resp: 18   General: 68 yo male, thin, comfortable, lying in bed. Breakfast tray untouched. HEENT: PERR, EOMI, Anicteic Sclera, MMM. No pharyngeal erythema or exudates  Neck: Supple, no JVD, no masses  Cardiovascular: RRR, S1 S2 auscultated, no rubs, murmurs or gallops.  Respiratory: good air movement, Mild coarse sounds, no accessory muscle movement - with equal chest rise  Abdomen: Soft, thin, nontender, nondistended, + bowel sounds, Surgical bandage is clean and dry.  Extremities: warm dry without cyanosis clubbing or edema.  Psych: Awake, Alert, Cooperative, pleasantly confused   Discharge Instructions      Discharge Orders   Future Appointments Provider Department Dept Phone   03/17/2013 2:00 PM Jason Hayward, MD [facility] [phone]   Future Orders Complete By Expires   Diet general  As directed    Increase activity slowly  As directed        Medication List    STOP taking these medications       dextrose 5 % SOLN 50 mL with cefTRIAXone 1 G SOLR 1 g     insulin aspart 100 UNIT/ML injection  Commonly known as:  novoLOG     metroNIDAZOLE 500 MG tablet  Commonly known as:  FLAGYL     predniSONE 10 MG tablet  Commonly known as:  DELTASONE      TAKE these medications       acetaminophen 325 MG tablet  Commonly known as:  TYLENOL  Take 2 tablets (650 mg total) by mouth every 6 (six) hours as needed for mild pain (or Fever >/= 101).     albuterol (2.5 MG/3ML) 0.083% nebulizer solution  Commonly known as:  PROVENTIL  Take 3 mLs (2.5 mg total) by nebulization every 4 (four) hours as needed for wheezing or shortness of  breath.     collagenase ointment  Commonly known as:  SANTYL  Apply topically daily. Apply to sacral wound daily.     docusate sodium 100 MG capsule  Commonly known as:  COLACE  Take 1 capsule (100 mg total) by mouth 2 (two) times daily as needed (take to keep stool soft.).     feeding supplement (GLUCERNA SHAKE) Liqd  Take 237 mLs by mouth daily.     folic acid 1 MG tablet  Commonly known as:  FOLVITE  Take 1 tablet (1 mg total) by mouth daily.     iron polysaccharides 150 MG capsule  Commonly known as:  NIFEREX  Take 1 capsule (150 mg total) by mouth daily.     multivitamin with minerals Tabs tablet  Take 1 tablet by mouth daily.     oxyCODONE 5 MG immediate release tablet  Commonly known as:  Oxy IR/ROXICODONE  Take 1 tablet (5 mg total) by  mouth every 4 (four) hours as needed for moderate pain.     senna-docusate 8.6-50 MG per tablet  Commonly known as:  Senokot-S  Take 1 tablet by mouth at bedtime.     sodium chloride 0.9 % SOLN 50 mL with ertapenem 1 G SOLR 1 g  Inject 1 g into the vein daily.     thiamine 100 MG tablet  Take 1 tablet (100 mg total) by mouth daily.      ASK your doctor about these medications       HYDROcodone-acetaminophen 5-325 MG per tablet  Commonly known as:  NORCO/VICODIN  Take 1 tablet by mouth every 6 (six) hours as needed for moderate pain or severe pain.       No Known Allergies Follow-up Information   Follow up with Ardis Hughs, MD On 03/26/2013. (1:15pm)    Specialty:  Urology   Contact information:   [facility]  [place] [address] [phone]       Follow up with Alcide Evener, MD On 03/17/2013. (2:00 pm.)    Specialty:  Infectious Diseases   Contact information:   [address] [place] [address] [phone]        The results of significant diagnostics from this hospitalization (including imaging, microbiology, ancillary and  laboratory) are listed below for reference.    Significant Diagnostic Studies:   Dg Chest 2 View  03/07/2013   CLINICAL DATA:  Sepsis, fever  EXAM: CHEST  2 VIEW  COMPARISON:  Prior radiograph from 02/22/2013  FINDINGS: Tip of a left PICC catheter overlies the cavoatrial junction. The cardiac and mediastinal silhouettes are stable in size and contour, and remain within normal limits.  COPD changes again noted. There is patchy opacity within the retrocardiac left lower lobe, which may reflect atelectasis or possibly infiltrate. There is mild subsegmental atelectasis within the bilateral lung bases. No airspace consolidation, pleural effusion, or pulmonary edema is identified. There is no pneumothorax.  No acute osseous abnormality identified.  IMPRESSION: 1. Patchy opacity within the retrocardiac left lower lobe, which may reflect atelectasis or infiltrate. 2. COPD.   Electronically Signed   By: Jeannine Boga M.D.   On: 03/07/2013 01:29    Ct Abdomen Pelvis W Contrast  03/07/2013   CLINICAL DATA:  Fever and altered mental status. Recent necrotizing fasciitis. Sacral decubitus ulcer.  EXAM: CT ABDOMEN AND PELVIS WITH CONTRAST  TECHNIQUE: Multidetector CT imaging of the abdomen and pelvis was performed using the standard protocol following bolus administration of intravenous contrast.  CONTRAST:  144mL OMNIPAQUE IOHEXOL 300 MG/ML  SOLN  COMPARISON:  02/18/2013  FINDINGS: BODY WALL: Unremarkable.  LOWER CHEST: Partial clearing of bibasilar pneumonia or aspiration pneumonitis. Coronary atherosclerosis.  ABDOMEN/PELVIS:  Liver: No focal abnormality.  Biliary: No evidence of biliary obstruction or stone.  Pancreas: Unremarkable.  Spleen: Unremarkable.  Adrenals: Unremarkable.  Kidneys and ureters: Mild bilateral hydronephrosis related to mass effect in the pelvis. Symmetric renal enhancement.  Bladder: Gas in the bladder which is usually from instrumentation. Bladder is circumferentially thick walled and  hyper enhancing. The mucosa is interrupted are on the left, likely communicating with the pelvic abscess.  Reproductive: There is in the elongated water density collection in the region of the posterior penile urethra measuring 5 cm in length by 2.4 cm in width. This is incompletely imaged. Tissue extending into the lower bladder lumen is likely hypertrophied prostate, similar to 2008 imaging.  Bowel: No obstruction. Appendix not clearly visualized. No pericecal inflammation. Colonic diverticulosis.  Retroperitoneum: In the region of previously seen gas-forming collection, there is now a predominately fluid collection (although also containing gas) in the extraperitoneal space of the lower abdomen and pelvis. The collection extends from the patient's incision (which is open at the level of the skin) inferiorly into the left, along the left pelvic sidewall. The collection measures up to 11 cm in AP dimension and 8 cm in width. There is thickening of the lower left rectus abdominis which could represent postoperative hemorrhage or inflammatory edema. The fascia around the rectum is circumferentially thickened.  Peritoneum: No free fluid or gas.  Vascular: No acute abnormality.  OSSEOUS: Subchondral erosions in the mid and lower lumbar spine or chronic and smooth, consider degenerative rather than infectious.  These results were called by telephone at the time of interpretation on 03/07/2013 at 225 AM to Dr. Linton Flemings , who verbally acknowledged these results.  IMPRESSION: 1. Recurrent or persistent abdominopelvic abscess, measuring up to 11 x 8 x 7 cm. The abscess communicates with the bladder lumen or bladder wall on the left (presumably the site of recent tumor resection). 2. Low-density expansion of the posterior penile urethra, likely malpositioned Foley catheter with inflated balloon. Correlate with exam. 3. Bilateral mild hydronephrosis from bladder distention or mass effect in the pelvis.   Electronically Signed    By: Jorje Guild M.D.   On: 03/07/2013 02:25    Dg Chest Port 1 View  03/08/2013   CLINICAL DATA:  Respiratory failure. Elevated white blood cell count.  EXAM: PORTABLE CHEST - 1 VIEW  COMPARISON:  03/07/2013  FINDINGS: There is opacity at the right lung base. This suggests a small effusion with either atelectasis or infiltrate.  Retrocardiac opacity noted on the prior day study is less apparent. The difference is likely due to differences in patient positioning and technique.  Lungs are hyperexpanded but otherwise clear. No evidence of a left effusion. No pneumothorax. Cardiac silhouette is normal in size. Left PICC is stable and well positioned.  IMPRESSION: 1. New right lung base hazy opacity likely a combination of a small effusion with either atelectasis or infiltrate. 2. Previously described retrocardiac opacity is less apparent. 3. No other change.   Electronically Signed   By: Lajean Manes M.D.   On: 03/08/2013 17:00       Ct Image Guided Fluid Drain By Catheter  03/07/2013   CLINICAL DATA:  Bladder rupture and left pelvic abscess.  EXAM: CT GUIDED DRAINAGE OF PELVIC PERITONEAL ABSCESS  ANESTHESIA/SEDATION: 1.0 Mg IV Versed 25 mcg IV Fentanyl  Total Moderate Sedation Time:  20 minutes  PROCEDURE: The procedure, risks, benefits, and alternatives were explained to the patient. Questions regarding the procedure were encouraged and answered. Informed consent was obtained from the patient's daughter due to underlying dementia.  The left lower anterior abdominal wall was prepped with Betadine in a sterile fashion, and a sterile drape was applied covering the operative field. A sterile gown and sterile gloves were used for the procedure. Local anesthesia was provided with 1% Lidocaine.  CT was performed in a supine position. Under CT fluoroscopic guidance, a 17 gauge trocar needle was advanced into the left pelvis. Aspiration was performed and a fluid sample sent for culture analysis. A guidewire was  advanced. The tract was dilated and a 12 French pigtail drainage catheter placed over the wire. The catheter was flushed and connected to a suction bulb. It  was secured at the skin with a Prolene retention suture and StatLock device.  COMPLICATIONS: None  FINDINGS: Initial CT performed in a supine position shows the presence of a Foley catheter in the bladder which is displaced slightly to the right of midline. Excreted contrast material from CT is present in the bladder. There is visible extravasation of diluted contrast from the left lateral wall of the bladder into the region of the elongated left-sided pelvic abscess. Aspiration yielded turbid fluid which was sent for culture. A 12 French drain was placed and there is good return of fluid.  IMPRESSION: CT-guided drainage of left pelvic abscess with placement of a 12 French percutaneous drain. This was attached to suction bulb drainage. Initial CT does confirm the presence of bladder rupture with leakage of diluted contrast from the left lateral wall of the bladder.   Electronically Signed   By: Irish Lack M.D.   On: 03/07/2013 15:14    Microbiology: Recent Results (from the past 240 hour(s))  CULTURE, BLOOD (ROUTINE X 2)     Status: None   Collection Time    03/06/13 11:35 PM      Result Value Ref Range Status   Specimen Description BLOOD RIGHT FOREARM   Final   Special Requests BOTTLES DRAWN AEROBIC AND ANAEROBIC 10CC   Final   Culture  Setup Time     Final   Value: 03/07/2013 03:30     Performed at [facility]   Culture     Final   Value:        BLOOD CULTURE RECEIVED NO GROWTH TO DATE CULTURE WILL BE HELD FOR 5 DAYS BEFORE ISSUING A FINAL NEGATIVE REPORT     Performed at [facility]   Report Status PENDING   Incomplete  CULTURE, BLOOD (ROUTINE X 2)     Status: None   Collection Time    03/07/13 12:26 AM      Result Value Ref Range Status   Specimen Description BLOOD LEFT PICC LINE   Final   Special Requests  BOTTLES DRAWN AEROBIC AND ANAEROBIC 10CC EACH   Final   Culture  Setup Time     Final   Value: 03/07/2013 11:51     Performed at [facility]   Culture     Final   Value:        BLOOD CULTURE RECEIVED NO GROWTH TO DATE CULTURE WILL BE HELD FOR 5 DAYS BEFORE ISSUING A FINAL NEGATIVE REPORT     Performed at [facility]   Report Status PENDING   Incomplete  URINE CULTURE     Status: None   Collection Time    03/07/13  2:22 AM      Result Value Ref Range Status   Specimen Description URINE, CATHETERIZED   Final   Special Requests Immunocompromised   Final   Culture  Setup Time     Final   Value: 03/07/2013 11:47     Performed at [facility] Count     Final   Value: NO GROWTH     Performed at [facility]   Culture     Final   Value: NO GROWTH     Performed at [facility]   Report Status 03/08/2013 FINAL   Final  CULTURE, ROUTINE-ABSCESS     Status: None   Collection Time    03/07/13  2:46 PM      Result Value Ref Range  Status   Specimen Description ABSCESS   Final   Special Requests PELVIC   Final   Gram Stain     Final   Value: MODERATE WBC PRESENT, PREDOMINANTLY PMN     NO SQUAMOUS EPITHELIAL CELLS SEEN     FEW GRAM NEGATIVE RODS     Performed at [facility]   Culture     Final   Value: NO GROWTH 3 DAYS     Performed at [facility]   Report Status 03/10/2013 FINAL   Final  ANAEROBIC CULTURE     Status: None   Collection Time    03/07/13  2:46 PM      Result Value Ref Range Status   Specimen Description ABSCESS   Final   Special Requests PELVIC   Final   Gram Stain PENDING   Incomplete   Culture     Final   Value: NO ANAEROBES ISOLATED; CULTURE IN PROGRESS FOR 5 DAYS     Performed at [facility]   Report Status PENDING   Incomplete     Labs: Basic Metabolic Panel:  Recent Labs Lab 03/06/13 2335 03/07/13 0555 03/08/13 1317 03/09/13 0350  NA 139 139 140 140  K 4.5 4.3 4.3 3.9  CL  101 106 103 104  CO2 22 22 22 23   GLUCOSE 98 101* 52* 51*  BUN 22 17 12 11   CREATININE 1.72* 1.71* 0.97 0.84  CALCIUM 8.3* 7.6* 8.1* 7.9*   Liver Function Tests:  Recent Labs Lab 03/06/13 2335  AST 21  ALT 25  ALKPHOS 102  BILITOT 0.7  PROT 6.4  ALBUMIN 2.4*   CBC:  Recent Labs Lab 03/06/13 2335 03/07/13 0555 03/08/13 1317 03/09/13 0350 03/11/13 0420  WBC 27.4* 31.9* 12.0* 9.3 7.4  NEUTROABS 26.0*  --   --   --   --   HGB 12.1* 11.5* 10.1* 10.2* 11.2*  HCT 34.9* 34.4* 29.9* 30.7* 33.6*  MCV 70.2* 71.1* 71.5* 71.6* 70.3*  PLT 146* 119* 104* 111* 122*   Cardiac Enzymes:  BNP: BNP (last 3 results)  Recent Labs  03/08/13 1317  PROBNP 545.3*   CBG:  Recent Labs Lab 03/07/13 0509  GLUCAP 96    Signed:  Karen Kitchens [phone]  [facility] 03/11/2013, 10:54 AM  Attending Seen and examined at, agree with the above assessment and plan. Stable for discharge. Continue with IV Invanz to seen by infectious disease on 3/17. Stable for discharge with close followup with urology and ID  Nena Alexander MD

## 2013-03-11 NOTE — Clinical Social Work Placement (Signed)
Clinical Social Work Department CLINICAL SOCIAL WORK PLACEMENT NOTE 03/11/2013  Patient:  Jason [name], Jason [name]  Account Number:  [id number] Admit date:  03/06/2013  Clinical Social Worker:  Kemper Durie, [place]  Date/time:  03/11/2013 11:49 AM  Clinical Social Work is seeking post-discharge placement for this patient at the following level of care:   SKILLED NURSING   (*CSW will update this form in Epic as items are completed)   03/10/2013  Patient/family provided with [facility] Department of Clinical Social Work's list of facilities offering this level of care within the geographic area requested by the patient (or if unable, by the patient's family).  03/10/2013  Patient/family informed of their freedom to choose among providers that offer the needed level of care, that participate in Medicare, Medicaid or managed care program needed by the patient, have an available bed and are willing to accept the patient.  03/10/2013  Patient/family informed of [facility]' ownership interest in [facility], as well as of the fact that they are under no obligation to receive care at this facility.  PASARR submitted to EDS on  PASARR number received from [place] on   FL2 transmitted to all facilities in geographic area requested by pt/family on  03/10/2013 FL2 transmitted to all facilities within larger geographic area on   Patient informed that his/her managed care company has contracts with or will negotiate with  certain facilities, including the following:     Patient/family informed of bed offers received:  03/10/2013 Patient chooses bed at [facility] Physician recommends and patient chooses bed at    Patient to be transferred to [facility] on  03/11/2013 Patient to be transferred to facility by [facility] wheel chair Lucianne Lei  The following physician request were entered in Epic:   Additional Comments: Per MD patient ready for Dc to [facility]. Rn, patient,  daughter, and facility notified of DC. RN given number for report. DC packet given to RN to give to [facility] transport. [facility] to transport patient by wheelchair van. Daughter will complete paperwork with facility. CSW signing off.    Liz Beach, [place], [place], [phone]

## 2013-03-11 NOTE — Progress Notes (Signed)
Physical Therapy Wound Treatment Patient Details  Name: Jason [name] MRN: [id number] Date of Birth: April 14, 1945  Today's Date: 03/11/2013 Time: 1010-1051 Time Calculation (min): 41 min  Subjective  Subjective: "Yes," pt stated when asked if some areas hurt with hydro.  Pain Score:  Couldn't rate but reports some areas of pain with ultrasonic  Wound Assessment  Pressure Ulcer Unstageable - Full thickness tissue loss in which the base of the ulcer is covered by slough (yellow, tan, gray, green or brown) and/or eschar (tan, brown or black) in the wound bed. (Active)  Dressing Type Moist to dry; santyl;Foam;Barrier Film (skin prep);Other (Comment) 03/11/2013 12:39 PM  Dressing Changed 03/11/2013 12:39 PM  Dressing Change Frequency Daily 03/11/2013 12:39 PM  State of Healing Eschar 03/11/2013 12:39 PM  Site / Wound Assessment Yellow;Pink;Brown;Black 03/11/2013 12:39 PM  % Wound base Red or Granulating 5% 03/11/2013 12:39 PM  % Wound base Yellow 35% 03/11/2013 12:39 PM  % Wound base Black 60% 03/11/2013 12:39 PM  % Wound base Other (Comment) 0% 03/11/2013 12:39 PM  Peri-wound Assessment Intact 03/11/2013 12:39 PM  Wound Length (cm) 10.5 cm 03/10/2013 10:08 AM  Wound Width (cm) 9.5 cm 03/10/2013 10:08 AM  Wound Depth (cm) 0.2 cm 03/10/2013 10:08 AM  Margins Unattached edges (unapproximated) 03/11/2013 12:39 PM  Drainage Amount Minimal 03/11/2013 12:39 PM  Drainage Description Serous 03/11/2013 12:39 PM  Treatment Debridement (Selective);Hydrotherapy (Ultrasonic mist) 03/11/2013 12:39 PM      Hydrotherapy Ultrasonic mist  - wound location: sacrum Ultrasonic mist at 35KHz (+/-3KHz) at ___ percent: 100 % Ultrasonic mist therapy minutes: 8 min Selective Debridement Selective Debridement - Location: sacrum Selective Debridement - Tools Used: Scalpel;Forceps Selective Debridement - Tissue Removed: yellow slough   Wound Assessment and Plan  Wound Therapy - Assess/Plan/Recommendations Wound Therapy -  Clinical Statement: Pt continues to have adherent eschar .  Hydrotherapy Plan: Debridement;Dressing change;Patient/family education;Ultrasonic wound therapy $RemoveBefor'@35'[id number]$  KHz (+/- 3 KHz) Wound Therapy - Frequency: 6X / week Wound Therapy - Follow Up Recommendations: Skilled nursing facility Wound Plan: See plan  Wound Therapy Goals- Improve the function of patient's integumentary system by progressing the wound(s) through the phases of wound healing (inflammation - proliferation - remodeling) by: Decrease Necrotic Tissue to: 80% Decrease Necrotic Tissue - Progress: Progressing toward goal Increase Granulation Tissue to: 20% Increase Granulation Tissue - Progress: Progressing toward goal  Goals will be updated until maximal potential achieved or discharge criteria met.  Discharge criteria: when goals achieved, discharge from hospital, MD decision/surgical intervention, no progress towards goals, refusal/missing three consecutive treatments without notification or medical reason.  GP     Kale Rondeau 03/11/2013, 12:43 PM  [facility] PT [phone]

## 2013-03-11 NOTE — Progress Notes (Signed)
  Subjective: LLQ abscess drain placed 3/7 Pt doing better.   Objective: Vital signs in last 24 hours: Temp:  [98.4 F (36.9 C)-98.8 F (37.1 C)] 98.5 F (36.9 C) (03/11 0400) Pulse Rate:  [65-96] 85 (03/11 0400) Resp:  [18] 18 (03/11 0400) BP: (121-142)/(62-82) 129/63 mmHg (03/11 0400) SpO2:  [95 %-97 %] 95 % (03/11 0400) Last BM Date: 03/10/13   PE:  Afeb; vss Output down since taken off bulb suction. About 29mL recorded. Site clean and dry; NT No growth on cx   Lab Results:   Recent Labs  03/09/13 0350 03/11/13 0420  WBC 9.3 7.4  HGB 10.2* 11.2*  HCT 30.7* 33.6*  PLT 111* 122*   BMET  Recent Labs  03/08/13 1317 03/09/13 0350  NA 140 140  K 4.3 3.9  CL 103 104  CO2 22 23  GLUCOSE 52* 51*  BUN 12 11  CREATININE 0.97 0.84  CALCIUM 8.1* 7.9*   PT/INR No results found for this basename: LABPROT, INR,  in the last 72 hours ABG No results found for this basename: PHART, PCO2, PO2, HCO3,  in the last 72 hours  Studies/Results: No results found.  Anti-infectives: Anti-infectives   Start     Dose/Rate Route Frequency Ordered Stop   03/10/13 1500  ertapenem (INVANZ) 1 g in sodium chloride 0.9 % 50 mL IVPB     1 g 100 mL/hr over 30 Minutes Intravenous Every 24 hours 03/10/13 1417     03/10/13 1300  vancomycin (VANCOCIN) IVPB 1000 mg/200 mL premix  Status:  Discontinued     1,000 mg 200 mL/hr over 60 Minutes Intravenous Every 12 hours 03/10/13 0243 03/10/13 1417   03/07/13 0800  piperacillin-tazobactam (ZOSYN) IVPB 3.375 g  Status:  Discontinued     3.375 g 12.5 mL/hr over 240 Minutes Intravenous 3 times per day 03/07/13 0105 03/10/13 1417   03/07/13 0115  vancomycin (VANCOCIN) 1,250 mg in sodium chloride 0.9 % 250 mL IVPB  Status:  Discontinued     1,250 mg 166.7 mL/hr over 90 Minutes Intravenous Every 24 hours 03/07/13 0105 03/10/13 0243   03/07/13 0115  piperacillin-tazobactam (ZOSYN) IVPB 3.375 g     3.375 g 12.5 mL/hr over 240 Minutes  Intravenous  Once 03/07/13 0105 03/07/13 0207      Assessment/Plan: Pelvic abscess secondary to bladder leak LLQ drain placed 3/7 Cont drain to gravity bag    LOS: 5 days    Ascencion Dike 03/11/2013

## 2013-03-11 NOTE — Progress Notes (Signed)
Urology discharge plan: 1. Continue BID wet-to-dry midline dressing changes 2. Leave pelvic drain to gravity drainage 3. Continue foley catheter 4. F/u with me on 03/26/13 at 1:15pm.    Thank you.

## 2013-03-11 NOTE — Care Management Note (Signed)
    Page 1 of 1   03/11/2013     2:48:59 PM   CARE MANAGEMENT NOTE 03/11/2013  Patient:  Jason [name], Jason [name]   Account Number:  [id number]  Date Initiated:  03/11/2013  Documentation initiated by:  Tomi Bamberger  Subjective/Objective Assessment:   dx  sepsis  admit- from [facility]     Action/Plan:   Anticipated DC Date:  03/11/2013   Anticipated DC Plan:  [place]  In-house referral  Clinical Social Worker      DC Planning Services  CM consult      Choice offered to / List presented to:             Status of service:  Completed, signed off Medicare Important Message given?   (If response is "NO", the following Medicare IM given date fields will be blank) Date Medicare IM given:   Date Additional Medicare IM given:    Discharge Disposition:  [place]  Per UR Regulation:  Reviewed for med. necessity/level of care/duration of stay  If discussed at [place] of Stay Meetings, dates discussed:    Comments:

## 2013-03-12 LAB — ANAEROBIC CULTURE

## 2013-03-13 LAB — CULTURE, BLOOD (ROUTINE X 2)
CULTURE: NO GROWTH
Culture: NO GROWTH

## 2013-03-16 ENCOUNTER — Ambulatory Visit (HOSPITAL_COMMUNITY)
Admission: RE | Admit: 2013-03-16 | Discharge: 2013-03-16 | Disposition: A | Discharge status: Home / Self Care | Payer: Medicare (Managed Care) | Source: Ambulatory Visit | Attending: Family Medicine | Admitting: Family Medicine

## 2013-03-16 DIAGNOSIS — L89109 Pressure ulcer of unspecified part of back, unspecified stage: Secondary | ICD-10-CM | POA: Insufficient documentation

## 2013-03-16 DIAGNOSIS — N179 Acute kidney failure, unspecified: Secondary | ICD-10-CM | POA: Insufficient documentation

## 2013-03-16 DIAGNOSIS — F17200 Nicotine dependence, unspecified, uncomplicated: Secondary | ICD-10-CM | POA: Insufficient documentation

## 2013-03-16 DIAGNOSIS — IMO0001 Reserved for inherently not codable concepts without codable children: Secondary | ICD-10-CM | POA: Insufficient documentation

## 2013-03-16 DIAGNOSIS — L8993 Pressure ulcer of unspecified site, stage 3: Secondary | ICD-10-CM | POA: Insufficient documentation

## 2013-03-16 DIAGNOSIS — I10 Essential (primary) hypertension: Secondary | ICD-10-CM | POA: Insufficient documentation

## 2013-03-16 NOTE — Progress Notes (Signed)
Physical Therapy - Wound Therapy  Evaluation   Patient Details  Name: Jason [name] MRN: [id number] Date of Birth: Jul 31, 1945  Today's Date: 03/16/2013 Time: 1101-1146 Time Calculation (min): 45 min  Visit#: 1 of 12  Re-eval: 04/13/13  Subjective Subjective Assessment Subjective: Patient states no pain prior to debridement, 7/10 pain during debridement Patient and Family Stated Goals: For wound to heal.  Date of Onset: 02/19/13  Pain Assessment Pain Assessment Pain Score: 7  Pain Type: Acute pain Pain Location: Sacrum Pain Intervention(s): Environmental changes  Wound Therapy Pressure Ulcer Unstageable - Full thickness tissue loss in which the base of the ulcer is covered by slough (yellow, tan, gray, green or brown) and/or eschar (tan, brown or black) in the wound bed. (Active)  Dressing Type Moist to dry;Other (Comment);Gauze (Comment) 03/16/2013 12:50 PM  Dressing Changed 03/16/2013 12:50 PM  Dressing Change Frequency Daily 03/16/2013 12:50 PM  State of Healing Eschar 03/16/2013 12:50 PM  Site / Wound Assessment Yellow;Pink;Brown;Black 03/16/2013 12:50 PM  % Wound base Red or Granulating 5% 03/16/2013 12:50 PM  % Wound base Yellow 55% 03/16/2013 12:50 PM  % Wound base Black 40% 03/16/2013 12:50 PM  % Wound base Other (Comment) 0% 03/16/2013 12:50 PM  Peri-wound Assessment Intact 03/16/2013 12:50 PM  Wound Length (cm) 11 cm 03/16/2013 12:50 PM  Wound Width (cm) 9 cm 03/16/2013 12:50 PM  Wound Depth (cm) 0 cm 03/16/2013 12:50 PM  Margins Unattached edges (unapproximated) 03/16/2013 12:50 PM  Drainage Amount Minimal 03/16/2013 12:50 PM  Drainage Description Serous 03/16/2013 12:50 PM  Treatment Hydrotherapy (Pulse lavage);Debridement (Selective) 03/16/2013 12:50 PM     Incision (Closed) 02/19/13 Abdomen (Active)  Dressing Type Gauze (Comment) 03/11/2013  7:22 AM  Dressing Clean;Dry;Intact 03/11/2013  7:22 AM  Dressing Change Frequency Twice a day 03/11/2013  7:22 AM  Site / Wound  Assessment Other (Comment) 03/11/2013  7:22 AM  Margins Unattached edges (unapproximated) 03/11/2013  1:25 AM  Closure None 03/11/2013  1:25 AM  Drainage Amount Scant 03/11/2013  1:25 AM  Drainage Description Serous 03/11/2013  1:25 AM  Treatment Cleansed 02/28/2013 10:13 AM   Hydrotherapy Pulsed lavage therapy - wound location: sacrum Pulsed Lavage with Suction (psi): 12 psi Pulsed Lavage with Suction - Normal Saline Used: 1000 mL Pulsed Lavage Tip: Tip with splash shield Ultrasonic mist  - wound location: sacrum Selective Debridement Selective Debridement - Location: sacrum Selective Debridement - Tools Used: Scalpel;Forceps Selective Debridement - Tissue Removed: yellow slough, bloack eschar   Physical Therapy Assessment and Plan Wound Therapy - Assess/Plan/Recommendations Wound Therapy - Clinical Statement: Patient has sacral wound with mile exudate with moderate slough and eschar that requires sharp debridemnt to remove. Patient displayed minimal popsitive benefit from pulsed lavage which will not be continues as sharp selective debridement display much more significant results leadign to a much better healing environement for the wound.  Wound Therapy - Functional Problem List: decr sitting tolerance. Factors Delaying/Impairing Wound Healing: Incontinence;Immobility;Multiple medical problems;Polypharmacy;Tobacco use Hydrotherapy Plan: Debridement;Dressing change;Patient/family education;Ultrasonic wound therapy @35  KHz (+/- 3 KHz) Wound Therapy - Frequency: 3X / week for 4 weeks Wound Therapy - Follow Up Recommendations: Skilled nursing facility Wound Plan: Continue with sharps debridement; applied honey to improve wound care environemnt, Discontinue pulsed levage due to ineffectivieness.       Goals Wound Therapy Goals - Improve the function of patient's integumentary system by progressing the wound(s) through the phases of wound healing by: Decrease Necrotic Tissue to: )% Decrease  Necrotic Tissue - Progress: Goal set  today Increase Granulation Tissue to: 100% Increase Granulation Tissue - Progress: Goal set today Decrease Length/Width/Depth by (cm): 8cm Decrease Length/Width/Depth - Progress: Goal set today Time For Goal Achievement: 2 weeks Wound Therapy - Potential for Goals: Fair  Problem List Patient Active Problem List   Diagnosis Date Noted  . Acute encephalopathy 03/07/2013  . Postoperative intra-abdominal abscess 03/07/2013  . Hydronephrosis, bilateral 03/07/2013  . Pressure ulcer, stage EHM(094.70) 03/07/2013  . UTI (lower urinary tract infection) 02/18/2013  . Sepsis 02/18/2013  . Aspiration pneumonia 02/18/2013  . Acute respiratory failure with hypoxia 02/18/2013  . Acute renal failure 02/18/2013  . Dementia 02/18/2013  . Hematuria 02/18/2013  . 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 Memory loss 03/27/2009  . CELLULITIS, HAND, LEFT 03/23/2009  . FATIGUE 03/15/2009  . ALCOHOL ABUSE 01/30/2008  . GOITER 10/25/2006  . NICOTINE ADDICTION 10/25/2006  . HYPERTENSION 10/25/2006  . COLONIC POLYPS, HX OF 10/25/2006  . BENIGN PROSTATIC HYPERTROPHY, HX OF 10/25/2006     GP Functional Assessment Tool Used: Clinical judgement  Letta Cargile R PT DPT 03/16/2013, 1:05 PM

## 2013-03-17 ENCOUNTER — Encounter: Payer: Self-pay | Admitting: Infectious Disease

## 2013-03-17 ENCOUNTER — Ambulatory Visit (INDEPENDENT_AMBULATORY_CARE_PROVIDER_SITE_OTHER): Payer: Medicare (Managed Care) | Admitting: Infectious Disease

## 2013-03-17 VITALS — BP 169/81 | HR 75 | Temp 98.2°F | Ht 73.0 in

## 2013-03-17 DIAGNOSIS — C679 Malignant neoplasm of bladder, unspecified: Secondary | ICD-10-CM

## 2013-03-17 DIAGNOSIS — A498 Other bacterial infections of unspecified site: Secondary | ICD-10-CM

## 2013-03-17 DIAGNOSIS — B9620 Unspecified Escherichia coli [E. coli] as the cause of diseases classified elsewhere: Secondary | ICD-10-CM

## 2013-03-17 DIAGNOSIS — N9972 Accidental puncture and laceration of a genitourinary system organ or structure during other procedure: Secondary | ICD-10-CM

## 2013-03-17 DIAGNOSIS — IMO0002 Reserved for concepts with insufficient information to code with codable children: Secondary | ICD-10-CM

## 2013-03-17 DIAGNOSIS — M726 Necrotizing fasciitis: Secondary | ICD-10-CM

## 2013-03-17 DIAGNOSIS — R7881 Bacteremia: Secondary | ICD-10-CM

## 2013-03-17 DIAGNOSIS — K651 Peritoneal abscess: Secondary | ICD-10-CM

## 2013-03-17 MED ORDER — AMOXICILLIN-POT CLAVULANATE 875-125 MG PO TABS: 1.0000 | ORAL_TABLET | Freq: Two times a day (BID) | ORAL | Status: DC

## 2013-03-17 NOTE — Progress Notes (Signed)
   Subjective:    Patient ID: Jason [name], male    DOB: [birth year], 68 y.o.   MRN: [id number]  HPI  68 year old man with dementia, bladder cancer status post removal of tumor with subsequent perforation intra-abdominal abscess with E coli bacteremia and and necrotizing fasciitis status post incision and debridement by CCS.  He was discharged home on March 2 to complete 4 weeks of antibiotic therapy with IV ceftriaxone and oral metronidazole. He developed fever, chills confusion leading to readmission on March 7. A new pelvic fluid collection was noted that was in communication with the bladder. There was also question of a very faint, patchy left lower lobe infiltrate. His antibiotic therapy was broadened. He has defervesced and improved. New cultures from abscess showed a gram-negative rod on Gram stain but nothing grew on culture. Patient was discharged home on IV ertapenem which he remained on through present day.  He feels better than he did well in the hospital is afebrile. His wound appears to be healing see pictures below. PICC line is clean. He has pelvic drain and Foley catheter in place.[name]  Review of Systems  Unable to perform ROS      Objective:   Physical Exam  Nursing note and vitals reviewed. Constitutional: No distress.  HENT:  Head: Normocephalic and atraumatic.  Mouth/Throat: Oropharynx is clear and moist. No oropharyngeal exudate.  Eyes: Conjunctivae and EOM are normal. No scleral icterus.  Neck: Normal range of motion. Neck supple.  Cardiovascular: Normal rate, regular rhythm and normal heart sounds.   Pulmonary/Chest: Effort normal. No respiratory distress. He has no wheezes.  Abdominal: Soft. Bowel sounds are normal. He exhibits no distension. There is no rebound.  Musculoskeletal: He exhibits no edema.  Lymphadenopathy:    He has no cervical adenopathy.  Neurological: He is alert.  Skin: Skin is warm. He is not diaphoretic. No erythema. No  pallor.  Psychiatric: He has a normal mood and affect. His behavior is normal. Judgment and thought content normal.   Abdominal incision site with granulation tissue in place:     Pelvic drain insertion site is clean:    PICC CDI             Assessment & Plan:   68 year old with dementia bladder cancer spbladder cancer and s/p TURBT 08/24/21 complicated by bladder perforation admitted with intra-abdominal abscess bladder leak , Escherichia coli bacteremia and necrotizing fasciitis status post incision and debridement by general surgery, treated with ceftriaxone and metronidazole with removal by the patient in a delirious state of his Foley catheter in pelvic drain and subsequent recurrence of intra-abdominal abscess status post drainage again currently on Invanz.  #1 intra-abdominal abscess: Will discontinue the Invanz and start the patient Augmentin one tablet twice daily. I think should remain on this until we have more convincing evidence that his enteral abdominal abscess has resolved with a CT scan.  I given him enough prescription for least a month with refills plan on seeing him back in the clinic in roughly one month's time.  I spent greater than 25 minutes with the patient including greater than 50% of time in face to face counsel of the patient and in coordination of their care.  #2 E coli bacteremia: adequetely treated  #3 Nec fascitis: site is healing  #4 Decubitus ulcer: not examined today

## 2013-03-18 ENCOUNTER — Ambulatory Visit (HOSPITAL_COMMUNITY)
Admit: 2013-03-18 | Discharge: 2013-03-18 | Disposition: A | Discharge status: Home / Self Care | Payer: Medicare (Managed Care) | Attending: Family Medicine | Admitting: Family Medicine

## 2013-03-18 LAB — FUNGUS CULTURE W SMEAR: Fungal Smear: NONE SEEN

## 2013-03-18 NOTE — Progress Notes (Signed)
Physical Therapy - Wound Therapy  Treatment   Patient Details  Name: Jason [name] MRN: [id number] Date of Birth: 04-21-1945  Today's Date: 03/18/2013 Time: 3846-6599 Time Calculation (min): 42 min Charge: selective debridement <20 cm  Visit#: 2 of 12  Re-eval: 04/13/13  Subjective Subjective Assessment Subjective: Pt reported no pain, dressings were changed and intact.  Pain Assessment Pain Assessment Pain Assessment: No/denies pain  Wound Therapy  03/18/13 1017  Subjective Assessment  Subjective Pt reported no pain, dressings were changed and intact.  Pressure Ulcer  No Date First Assessed or Time First Assessed found.   Location: Sacrum  Location Orientation: Medial  Staging: Unstageable - Full thickness tissue loss in which the base of the ulcer is covered by slough (yellow, tan, gray, green or brown) and/or eschar  Dressing Type Moist to dry;Other (Comment);Gauze (Comment) (medihoney)  Dressing Changed  Dressing Change Frequency Daily  State of Healing Eschar  Site / Wound Assessment Yellow;Pink;Brown;Black  % Wound base Red or Granulating 5%  % Wound base Yellow 55%  % Wound base Black 40%  % Wound base Other (Comment) 0%  Peri-wound Assessment Intact  Margins Unattached edges (unapproximated)  Drainage Amount Minimal  Drainage Description Serous  Treatment Cleansed;Debridement (Selective)  Wound Therapy - Assess/Plan/Recommendations  Wound Therapy - Clinical Statement Pt tolerated well towards debridement focusing on removal of slough and eschar.  Eschar very adherent, able to removal superficial layer only.  Continued with moist to dry medihoney dressings and gauze.    Wound Therapy - Follow Up Recommendations Skilled nursing facility  Wound Plan Continue with sharps debridement; applied honey to improve wound care environemnt, Discontinue pulsed levage due to ineffectivieness.         Physical Therapy Assessment and Plan Wound Therapy -  Assess/Plan/Recommendations Wound Therapy - Clinical Statement: Pt tolerated well towards debridement focusing on removal of slough and eschar.  Eschar very adherent, able to removal superficial layer only.  Continued with moist to dry medihoney dressings and gauze.   Wound Therapy - Follow Up Recommendations: Skilled nursing facility Wound Plan: Continue with sharps debridement; applied honey to improve wound care environemnt, Discontinue pulsed levage due to ineffectivieness.       Goals Wound Therapy Goals - Improve the function of patient's integumentary system by progressing the wound(s) through the phases of wound healing by: Decrease Necrotic Tissue to: )% Decrease Necrotic Tissue - Progress: Progressing toward goal Increase Granulation Tissue to: 100% Increase Granulation Tissue - Progress: Progressing toward goal Decrease Length/Width/Depth by (cm): 8cm Decrease Length/Width/Depth - Progress: Progressing toward goal Time For Goal Achievement: 2 weeks Wound Therapy - Potential for Goals: Fair  Problem List Patient Active Problem List   Diagnosis Date Noted  . Acute encephalopathy 03/07/2013  . Postoperative intra-abdominal abscess 03/07/2013  . Hydronephrosis, bilateral 03/07/2013  . Pressure ulcer, stage JTT(017.79) 03/07/2013  . UTI (lower urinary tract infection) 02/18/2013  . Sepsis 02/18/2013  . Aspiration pneumonia 02/18/2013  . Acute respiratory failure with hypoxia 02/18/2013  . Acute renal failure 02/18/2013  . Dementia 02/18/2013  . Hematuria 02/18/2013  . 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 Memory loss 03/27/2009  . CELLULITIS, HAND, LEFT 03/23/2009  . FATIGUE 03/15/2009  . ALCOHOL ABUSE 01/30/2008  . GOITER 10/25/2006  . NICOTINE ADDICTION 10/25/2006  . HYPERTENSION 10/25/2006  . COLONIC POLYPS, HX OF 10/25/2006  . BENIGN PROSTATIC HYPERTROPHY, HX OF 10/25/2006    GP    Aldona Lento 03/18/2013, 10:31 AM

## 2013-03-20 ENCOUNTER — Other Ambulatory Visit (HOSPITAL_COMMUNITY): Payer: Medicare (Managed Care)

## 2013-03-20 ENCOUNTER — Ambulatory Visit (HOSPITAL_COMMUNITY)
Admit: 2013-03-20 | Discharge: 2013-03-20 | Disposition: A | Discharge status: Home / Self Care | Payer: Medicare (Managed Care) | Attending: Family Medicine | Admitting: Family Medicine

## 2013-03-20 NOTE — Progress Notes (Signed)
Physical Therapy - Wound Therapy  Treatment   Patient Details  Name: Jason [name] MRN: [id number] Date of Birth: 03-22-1945  Today's Date: 03/20/2013 Time: 0347-4259 Time Calculation (min): 45 min Charge: debridement 58min  Visit#: 3 of 12  Re-eval: 04/13/13  Subjective Subjective Assessment Subjective: Pt reported no pain, dressings were changed and intact. Patient and Family Stated Goals: For wound to heal.  Date of Onset: 02/19/13  Pain Assessment Pain Assessment Pain Assessment: No/denies pain Pain Score: 0-No pain Faces Pain Scale: No hurt  Wound Therapy Pressure Ulcer Unstageable - Full thickness tissue loss in which the base of the ulcer is covered by slough (yellow, tan, gray, green or brown) and/or eschar (tan, brown or black) in the wound bed. (Active)  Dressing Type Moist to dry;Other (Comment);Gauze (Comment) 03/20/2013  4:51 PM  Dressing Changed 03/20/2013  4:51 PM  Dressing Change Frequency Daily 03/20/2013  4:51 PM  State of Healing Eschar 03/20/2013  4:51 PM  Site / Wound Assessment Yellow;Pink;Brown;Black 03/20/2013  4:51 PM  % Wound base Red or Granulating 5% 03/20/2013  4:51 PM  % Wound base Yellow 60% 03/20/2013  4:51 PM  % Wound base Black 35% 03/20/2013  4:51 PM  % Wound base Other (Comment) 0% 03/20/2013  4:51 PM  Peri-wound Assessment Intact 03/20/2013  4:51 PM  Wound Length (cm) 11 cm 03/20/2013  4:51 PM  Wound Width (cm) 9 cm 03/20/2013  4:51 PM  Wound Depth (cm) 0 cm 03/20/2013  4:51 PM  Margins Unattached edges (unapproximated) 03/20/2013  4:51 PM  Drainage Amount Minimal 03/20/2013  4:51 PM  Drainage Description Serous 03/20/2013  4:51 PM  Treatment Cleansed;Debridement (Selective) 03/20/2013  4:51 PM     Incision (Closed) 02/19/13 Abdomen (Active)  Dressing Type Gauze (Comment) 03/20/2013  4:51 PM  Dressing Clean;Dry;Intact 03/20/2013  4:51 PM  Dressing Change Frequency Twice a day 03/20/2013  4:51 PM  Site / Wound Assessment Other (Comment) 03/20/2013  4:51  PM  Margins Unattached edges (unapproximated) 03/20/2013  4:51 PM  Closure None 03/20/2013  4:51 PM  Drainage Amount Scant 03/20/2013  4:51 PM  Drainage Description Serous 03/20/2013  4:51 PM  Treatment Cleansed 03/20/2013  4:51 PM   Selective Debridement Selective Debridement - Location: sacrum Selective Debridement - Tools Used: Scalpel;Forceps Selective Debridement - Tissue Removed: yellow slough, bloack eschar   Physical Therapy Assessment and Plan Wound Therapy - Assess/Plan/Recommendations Wound Therapy - Clinical Statement: Pt tolerated well towards debridement focusing on removal of slough and eschar.  Eschar very adherent, able to removal superficial layer only.  Continued with moist to dry medihoney dressings and gauze.   Wound Therapy - Functional Problem List: decr sitting tolerance. Factors Delaying/Impairing Wound Healing: Incontinence;Immobility;Multiple medical problems;Polypharmacy;Tobacco use Hydrotherapy Plan: Debridement;Dressing change;Patient/family education;Ultrasonic wound therapy @35  KHz (+/- 3 KHz) Wound Therapy - Frequency: 3X / week Wound Therapy - Follow Up Recommendations: Skilled nursing facility Wound Plan: Continue with sharps debridement, and application of honey to assist and improve wound care healing. If patient's eschar continues to display difficulty debriding secondary to sensitity recommend santile to increase autolytic debriendement.      Goals Wound Therapy Goals - Improve the function of patient's integumentary system by progressing the wound(s) through the phases of wound healing by: Decrease Necrotic Tissue to: 0% Increase Granulation Tissue to: 100% Decrease Length/Width/Depth by (cm): 8cm Decrease Length/Width/Depth - Progress: Progressing toward goal Goals/treatment plan/discharge plan were made with and agreed upon by patient/family: No, Patient unable to participate in goals/treatment/discharge plan and family  unavailable Time For Goal  Achievement: 2 weeks Wound Therapy - Potential for Goals: Fair  Problem List Patient Active Problem List   Diagnosis Date Noted  . Acute encephalopathy 03/07/2013  . Postoperative intra-abdominal abscess 03/07/2013  . Hydronephrosis, bilateral 03/07/2013  . Pressure ulcer, stage OMA(004.59) 03/07/2013  . UTI (lower urinary tract infection) 02/18/2013  . Sepsis 02/18/2013  . Aspiration pneumonia 02/18/2013  . Acute respiratory failure with hypoxia 02/18/2013  . Acute renal failure 02/18/2013  . Dementia 02/18/2013  . Hematuria 02/18/2013  . 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 Memory loss 03/27/2009  . CELLULITIS, HAND, LEFT 03/23/2009  . FATIGUE 03/15/2009  . ALCOHOL ABUSE 01/30/2008  . GOITER 10/25/2006  . NICOTINE ADDICTION 10/25/2006  . HYPERTENSION 10/25/2006  . COLONIC POLYPS, HX OF 10/25/2006  . BENIGN PROSTATIC HYPERTROPHY, HX OF 10/25/2006    GP Functional Assessment Tool Used: Clinical judgement  Treyvonne Tata R PT, DPT 03/20/2013, 4:57 PM

## 2013-03-23 ENCOUNTER — Ambulatory Visit (HOSPITAL_COMMUNITY)
Admit: 2013-03-23 | Discharge: 2013-03-23 | Disposition: A | Discharge status: Home / Self Care | Payer: Medicare (Managed Care) | Attending: Family Medicine | Admitting: Family Medicine

## 2013-03-23 ENCOUNTER — Ambulatory Visit (HOSPITAL_COMMUNITY)
Admission: RE | Admit: 2013-03-23 | Discharge: 2013-03-23 | Disposition: A | Discharge status: Home / Self Care | Payer: Medicare (Managed Care) | Source: Ambulatory Visit | Attending: Family Medicine | Admitting: Family Medicine

## 2013-03-23 DIAGNOSIS — R29810 Facial weakness: Secondary | ICD-10-CM | POA: Insufficient documentation

## 2013-03-23 DIAGNOSIS — G319 Degenerative disease of nervous system, unspecified: Secondary | ICD-10-CM | POA: Insufficient documentation

## 2013-03-23 DIAGNOSIS — I6350 Cerebral infarction due to unspecified occlusion or stenosis of unspecified cerebral artery: Secondary | ICD-10-CM | POA: Insufficient documentation

## 2013-03-23 DIAGNOSIS — I6789 Other cerebrovascular disease: Secondary | ICD-10-CM | POA: Insufficient documentation

## 2013-03-23 NOTE — Progress Notes (Signed)
Physical Therapy - Wound Therapy  Treatment   Patient Details  Name: Jason [name] MRN: [id number] Date of Birth: [birth year]  Today's Date: 03/23/2013 Time: 6160-7371 Time Calculation (min): 35 min Charges: Debridement [phone]  Visit#: 4 of 12  Re-eval: 04/13/13  Subjective Subjective Assessment Subjective: Pt reported no pain though he has  Patient and Family Stated Goals: For wound to heal.  Date of Onset: 02/19/13  Pain Assessment Pain Assessment Pain Assessment: No/denies pain Pain Score: 0-No pain Faces Pain Scale: No hurt  Wound Therapy Pressure Ulcer Unstageable - Full thickness tissue loss in which the base of the ulcer is covered by slough (yellow, tan, gray, green or brown) and/or eschar (tan, brown or black) in the wound bed. (Active)  Dressing Type Other (Comment);Gauze (Comment);Moist to moist 03/23/2013 11:19 AM  Dressing Changed 03/23/2013 11:19 AM  Dressing Change Frequency Daily 03/20/2013  4:51 PM  State of Healing Eschar 03/23/2013 11:19 AM  Site / Wound Assessment Yellow;Pink;Brown;Black;Bleeding;Red 03/23/2013 11:19 AM  % Wound base Red or Granulating 25% 03/23/2013 11:19 AM  % Wound base Yellow 45% 03/23/2013 11:19 AM  % Wound base Black 30% 03/23/2013 11:19 AM  % Wound base Other (Comment) 0% 03/23/2013 11:19 AM  Peri-wound Assessment Intact 03/23/2013 11:19 AM  Wound Length (cm) 11 cm 03/20/2013  4:51 PM  Wound Width (cm) 9 cm 03/20/2013  4:51 PM  Wound Depth (cm) 0 cm 03/20/2013  4:51 PM  Margins Unattached edges (unapproximated) 03/23/2013 11:19 AM  Drainage Amount Minimal 03/20/2013  4:51 PM  Drainage Description Serous 03/23/2013 11:19 AM  Treatment Cleansed;Debridement (Selective) 03/23/2013 11:19 AM     Incision (Closed) 02/19/13 Abdomen (Active)  Dressing Type Gauze (Comment) 03/20/2013  4:51 PM  Dressing Clean;Dry;Intact 03/20/2013  4:51 PM  Dressing Change Frequency Twice a day 03/20/2013  4:51 PM  Site / Wound Assessment Other (Comment) 03/20/2013  4:51  PM  Margins Unattached edges (unapproximated) 03/20/2013  4:51 PM  Closure None 03/20/2013  4:51 PM  Drainage Amount Scant 03/20/2013  4:51 PM  Drainage Description Serous 03/20/2013  4:51 PM  Treatment Cleansed 03/20/2013  4:51 PM       Physical Therapy Assessment and Plan Wound Therapy - Assess/Plan/Recommendations Wound Therapy - Clinical Statement: Pt tolerated well towards debridement focusing on removal of slough and eschar.  Eschar has become less adherant and less sensitive though patient shakes while removign eschar despite noting he is not in pain. patient states "I shake when you are down there because it is a reaction, but it doesn't hurt."  Continued with moist to moist dressing with honey dressings and gauze and vaseline for periwound area.   Wound Therapy - Functional Problem List: decr sitting tolerance. Factors Delaying/Impairing Wound Healing: Incontinence;Immobility;Multiple medical problems;Polypharmacy;Tobacco use Hydrotherapy Plan: Debridement;Dressing change;Patient/family education;Ultrasonic wound therapy @35  KHz (+/- 3 KHz) Wound Therapy - Frequency: 3X / week Wound Therapy - Follow Up Recommendations: Skilled nursing facility Wound Plan: Continue with sharps debridement, and application of honey to assist and improve wound bed healing. Patient's eschar displays improving  ability to be debrided secondary to less sensitity, recommend santile to increase autolytic debriendement if patient is unable to tolerate further debridemnent next session. .      Goals Wound Therapy Goals - Improve the function of patient's integumentary system by progressing the wound(s) through the phases of wound healing by: Decrease Necrotic Tissue to: 0% Increase Granulation Tissue to: 100% Decrease Length/Width/Depth by (cm): 8cm Goals/treatment plan/discharge plan were made with and agreed upon by patient/family:  No, Patient unable to participate in goals/treatment/discharge plan and family  unavailable Time For Goal Achievement: 2 weeks Wound Therapy - Potential for Goals: Fair  Problem List Patient Active Problem List   Diagnosis Date Noted  . Acute encephalopathy 03/07/2013  . Postoperative intra-abdominal abscess 03/07/2013  . Hydronephrosis, bilateral 03/07/2013  . Pressure ulcer, stage MBW(466.59) 03/07/2013  . UTI (lower urinary tract infection) 02/18/2013  . Sepsis 02/18/2013  . Aspiration pneumonia 02/18/2013  . Acute respiratory failure with hypoxia 02/18/2013  . Acute renal failure 02/18/2013  . Dementia 02/18/2013  . Hematuria 02/18/2013  . 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 Memory loss 03/27/2009  . CELLULITIS, HAND, LEFT 03/23/2009  . FATIGUE 03/15/2009  . ALCOHOL ABUSE 01/30/2008  . GOITER 10/25/2006  . NICOTINE ADDICTION 10/25/2006  . HYPERTENSION 10/25/2006  . COLONIC POLYPS, HX OF 10/25/2006  . BENIGN PROSTATIC HYPERTROPHY, HX OF 10/25/2006    GP Functional Assessment Tool Used: Clinical judgement  Damante Spragg R PT, DPT 03/23/2013, 11:30 AM

## 2013-03-25 ENCOUNTER — Ambulatory Visit (HOSPITAL_COMMUNITY)
Admit: 2013-03-25 | Discharge: 2013-03-25 | Disposition: A | Discharge status: Home / Self Care | Payer: Medicare (Managed Care) | Attending: Family Medicine | Admitting: Family Medicine

## 2013-03-25 NOTE — Progress Notes (Signed)
Physical Therapy - Wound Therapy  Treatment   Patient Details  Name: Jason [name] MRN: [id number] Date of Birth: Apr 17, 1945  Today's Date: 03/25/2013 Time: 0930-1015 Time Calculation (min): 45 min CHarges: 1 Debridement.  Visit#: 5 of 12  Re-eval: 04/13/13  Subjective Subjective Assessment Subjective: Pt reports no pain during debridement, then later in session he states moderate discumfort.  Patient and Family Stated Goals: For wound to heal.  Date of Onset: 02/19/13  Pain Assessment Pain Assessment Pain Assessment: No/denies pain Faces Pain Scale: Hurts little more  Wound Therapy Pressure Ulcer Unstageable - Full thickness tissue loss in which the base of the ulcer is covered by slough (yellow, tan, gray, green or brown) and/or eschar (tan, brown or black) in the wound bed. (Active)  Dressing Type Other (Comment);Gauze (Comment);Moist to moist 03/25/2013 12:02 PM  Dressing Changed 03/25/2013 12:02 PM  Dressing Change Frequency Daily 03/25/2013 12:02 PM  State of Healing Eschar 03/25/2013 12:02 PM  Site / Wound Assessment Yellow;Pink;Brown;Black;Bleeding;Red 03/25/2013 12:02 PM  % Wound base Red or Granulating 60% 03/25/2013 12:02 PM  % Wound base Yellow 40% 03/25/2013 12:02 PM  % Wound base Black 0% 03/25/2013 12:02 PM  % Wound base Other (Comment) 0% 03/25/2013 12:02 PM  Peri-wound Assessment Intact 03/25/2013 12:02 PM  Wound Length (cm) 11 cm 03/20/2013  4:51 PM  Wound Width (cm) 9 cm 03/20/2013  4:51 PM  Wound Depth (cm) 0 cm 03/20/2013  4:51 PM  Margins Unattached edges (unapproximated) 03/25/2013 12:02 PM  Drainage Amount Minimal 03/25/2013 12:02 PM  Drainage Description Serous 03/25/2013 12:02 PM  Treatment Cleansed;Debridement (Selective) 03/25/2013 12:02 PM     Incision (Closed) 02/19/13 Abdomen (Active)  Dressing Type Gauze (Comment);Honeycomb 03/25/2013 12:02 PM  Dressing Clean;Dry;Intact 03/25/2013 12:02 PM  Dressing Change Frequency Daily 03/25/2013 12:02 PM  Site / Wound  Assessment Other (Comment) 03/25/2013 12:02 PM  Margins Unattached edges (unapproximated) 03/25/2013 12:02 PM  Closure None 03/25/2013 12:02 PM  Drainage Amount Scant 03/25/2013 12:02 PM  Drainage Description Serous 03/25/2013 12:02 PM  Treatment Cleansed 03/25/2013 12:02 PM   Selective Debridement Selective Debridement - Location: sacrum Selective Debridement - Tools Used: Scalpel;Forceps Selective Debridement - Tissue Removed: yellow slough, eand necrotixc tissue.   Physical Therapy Assessment and Plan Wound Therapy - Assess/Plan/Recommendations Wound Therapy - Clinical Statement: Pt tolerated well towards debridement focusing on removal of slough and eschar.  Eschar has become less adherant and less sensitive though patient shakes while removign eschar despite noting he is not in pain. Continued with moist to moist dressing with honey dressings and gauze and vaseline for periwound area.   Wound Therapy - Functional Problem List: decr sitting tolerance. Factors Delaying/Impairing Wound Healing: Incontinence;Immobility;Multiple medical problems;Polypharmacy;Tobacco use Hydrotherapy Plan: Debridement;Dressing change;Patient/family education;Ultrasonic wound therapy @35  KHz (+/- 3 KHz) Wound Therapy - Frequency: 3X / week Wound Therapy - Follow Up Recommendations: Skilled nursing facility Wound Plan: Continue with sharps debridement, and application of honey to assist and improve wound bed healing. Patient's slough displays improving  ability to be debrided secondary to less sensitity, recommend santile to increase autolytic debriendement if patient is unable to tolerate further debridemnent next session.      Goals Wound Therapy Goals - Improve the function of patient's integumentary system by progressing the wound(s) through the phases of wound healing by: Decrease Necrotic Tissue to: 0% Decrease Necrotic Tissue - Progress: Progressing toward goal Increase Granulation Tissue to: 100% Increase  Granulation Tissue - Progress: Progressing toward goal Decrease Length/Width/Depth by (cm): 8cm Decrease Length/Width/Depth -  Progress: Progressing toward goal Goals/treatment plan/discharge plan were made with and agreed upon by patient/family: No, Patient unable to participate in goals/treatment/discharge plan and family unavailable Time For Goal Achievement: 2 weeks Wound Therapy - Potential for Goals: Fair  Problem List Patient Active Problem List   Diagnosis Date Noted  . Acute encephalopathy 03/07/2013  . Postoperative intra-abdominal abscess 03/07/2013  . Hydronephrosis, bilateral 03/07/2013  . Pressure ulcer, stage FYB(017.51) 03/07/2013  . UTI (lower urinary tract infection) 02/18/2013  . Sepsis 02/18/2013  . Aspiration pneumonia 02/18/2013  . Acute respiratory failure with hypoxia 02/18/2013  . Acute renal failure 02/18/2013  . Dementia 02/18/2013  . Hematuria 02/18/2013  . 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 Memory loss 03/27/2009  . CELLULITIS, HAND, LEFT 03/23/2009  . FATIGUE 03/15/2009  . ALCOHOL ABUSE 01/30/2008  . GOITER 10/25/2006  . NICOTINE ADDICTION 10/25/2006  . HYPERTENSION 10/25/2006  . COLONIC POLYPS, HX OF 10/25/2006  . BENIGN PROSTATIC HYPERTROPHY, HX OF 10/25/2006    GP Functional Assessment Tool Used: Clinical judgement  Jacqeline Broers R DPT, PT 03/25/2013, 12:07 PM

## 2013-03-27 ENCOUNTER — Ambulatory Visit (HOSPITAL_COMMUNITY)
Admit: 2013-03-27 | Discharge: 2013-03-27 | Disposition: A | Discharge status: Home / Self Care | Payer: Medicare (Managed Care) | Attending: Family Medicine | Admitting: Family Medicine

## 2013-03-27 NOTE — Progress Notes (Signed)
Physical Therapy - Wound Therapy  Treatment   Patient Details  Name: Jason [name] MRN: [id number] Date of Birth: May 20, 1945  Today's Date: 03/27/2013 Time: 2671-2458 Time Calculation (min): 45 min Debridement 46min Visit#: 6 of    Re-eval: 04/13/13  Subjective Subjective Assessment Subjective: Pt reports no pain during debridement, then later in session he states moderate discumfort.  Patient and Family Stated Goals: For wound to heal.  Date of Onset: 02/19/13  Pain Assessment Pain Assessment Pain Assessment: No/denies pain Pain Score: 0-No pain Faces Pain Scale: No hurt  Wound Therapy Pressure Ulcer Unstageable - Full thickness tissue loss in which the base of the ulcer is covered by slough (yellow, tan, gray, green or brown) and/or eschar (tan, brown or black) in the wound bed. (Active)  Dressing Type Other (Comment);Gauze (Comment);Moist to moist 03/27/2013 11:51 AM  Dressing Changed 03/27/2013 11:51 AM  Dressing Change Frequency Daily 03/27/2013 11:51 AM  State of Healing Early/partial granulation 03/27/2013 11:51 AM  Site / Wound Assessment Yellow;Pink;Brown;Bleeding;Red 03/27/2013 11:51 AM  % Wound base Red or Granulating 60% 03/27/2013 11:51 AM  % Wound base Yellow 40% 03/27/2013 11:51 AM  % Wound base Black 0% 03/27/2013 11:51 AM  % Wound base Other (Comment) 0% 03/27/2013 11:51 AM  Peri-wound Assessment Intact 03/27/2013 11:51 AM  Wound Length (cm) 11 cm 03/20/2013  4:51 PM  Wound Width (cm) 9 cm 03/20/2013  4:51 PM  Wound Depth (cm) 0 cm 03/20/2013  4:51 PM  Margins Unattached edges (unapproximated) 03/27/2013 11:51 AM  Drainage Amount Minimal 03/27/2013 11:51 AM  Drainage Description Serous 03/27/2013 11:51 AM  Treatment Cleansed;Debridement (Selective) 03/27/2013 11:50 AM     Incision (Closed) 02/19/13 Abdomen (Active)  Dressing Type Gauze (Comment);Honeycomb 03/25/2013 12:02 PM  Dressing Clean;Dry;Intact 03/25/2013 12:02 PM  Dressing Change Frequency Daily 03/25/2013 12:02 PM   Site / Wound Assessment Other (Comment) 03/25/2013 12:02 PM  Margins Unattached edges (unapproximated) 03/25/2013 12:02 PM  Closure None 03/25/2013 12:02 PM  Drainage Amount Scant 03/25/2013 12:02 PM  Drainage Description Serous 03/25/2013 12:02 PM  Treatment Cleansed 03/25/2013 12:02 PM   Selective Debridement Selective Debridement - Location: sacrum Selective Debridement - Tools Used: Scalpel;Forceps Selective Debridement - Tissue Removed: yellow slough, and necrotixc tissue.   Physical Therapy Assessment and Plan Wound Therapy - Assess/Plan/Recommendations Wound Therapy - Clinical Statement: Pt tolerated well towards debridement focusing on removal of slough and eschar.  Eschar has become less adherant and less sensitive though patient shakes while removign eschar despite noting he is not in pain. Continued with moist to moist dressing with honey dressings and gauze and vaseline for periwound area.   Wound Therapy - Functional Problem List: decr sitting tolerance. Factors Delaying/Impairing Wound Healing: Incontinence;Immobility;Multiple medical problems;Polypharmacy;Tobacco use Hydrotherapy Plan: Debridement;Dressing change;Patient/family education;Ultrasonic wound therapy @35  KHz (+/- 3 KHz) Wound Therapy - Frequency: 3X / week Wound Therapy - Follow Up Recommendations: Skilled nursing facility      Goals Wound Therapy Goals - Improve the function of patient's integumentary system by progressing the wound(s) through the phases of wound healing by: Decrease Necrotic Tissue to: 0% Decrease Necrotic Tissue - Progress: Progressing toward goal Increase Granulation Tissue to: 100% Increase Granulation Tissue - Progress: Progressing toward goal Decrease Length/Width/Depth by (cm): 8cm Decrease Length/Width/Depth - Progress: Progressing toward goal Goals/treatment plan/discharge plan were made with and agreed upon by patient/family: No, Patient unable to participate in  goals/treatment/discharge plan and family unavailable Time For Goal Achievement: 2 weeks Wound Therapy - Potential for Goals: Fair  Problem List  Patient Active Problem List   Diagnosis Date Noted  . Acute encephalopathy 03/07/2013  . Postoperative intra-abdominal abscess 03/07/2013  . Hydronephrosis, bilateral 03/07/2013  . Pressure ulcer, stage TGY(563.89) 03/07/2013  . UTI (lower urinary tract infection) 02/18/2013  . Sepsis 02/18/2013  . Aspiration pneumonia 02/18/2013  . Acute respiratory failure with hypoxia 02/18/2013  . Acute renal failure 02/18/2013  . Dementia 02/18/2013  . Hematuria 02/18/2013  . 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 Memory loss 03/27/2009  . CELLULITIS, HAND, LEFT 03/23/2009  . FATIGUE 03/15/2009  . ALCOHOL ABUSE 01/30/2008  . GOITER 10/25/2006  . NICOTINE ADDICTION 10/25/2006  . HYPERTENSION 10/25/2006  . COLONIC POLYPS, HX OF 10/25/2006  . BENIGN PROSTATIC HYPERTROPHY, HX OF 10/25/2006    GP Functional Assessment Tool Used: Clinical judgement  Rozena Fierro R PT DPT 03/27/2013, 11:54 AM

## 2013-03-30 ENCOUNTER — Ambulatory Visit (HOSPITAL_COMMUNITY)
Admit: 2013-03-30 | Discharge: 2013-03-30 | Disposition: A | Discharge status: Home / Self Care | Payer: Medicare (Managed Care) | Attending: Family Medicine | Admitting: Family Medicine

## 2013-03-30 NOTE — Progress Notes (Signed)
Physical Therapy - Wound Therapy  Treatment   Patient Details  Name: Jason [name] MRN: [id number] Date of Birth: 03-Jul-1945  Today's Date: 03/30/2013 Time: 1015-1100 Time Calculation (min): 45 min Charges: Debridement 1015-1100  Visit#: 7 of 12  Re-eval: 04/13/13  Subjective Subjective Assessment Subjective: Pt reports no pain prior too or through outdebridement, Patient and Family Stated Goals: For wound to heal.  Date of Onset: 02/19/13  Pain Assessment Pain Assessment Pain Assessment: No/denies pain  Wound Therapy Pressure Ulcer Unstageable - Full thickness tissue loss in which the base of the ulcer is covered by slough (yellow, tan, gray, green or brown) and/or eschar (tan, brown or black) in the wound bed. (Active)  Dressing Type Other (Comment);Gauze (Comment);Moist to moist 03/30/2013 11:06 AM  Dressing Changed 03/30/2013 11:06 AM  Dressing Change Frequency Daily 03/30/2013 11:06 AM  State of Healing Early/partial granulation 03/30/2013 11:06 AM  Site / Wound Assessment Yellow;Pink;Bleeding;Red 03/30/2013 11:06 AM  % Wound base Red or Granulating 70% 03/30/2013 11:06 AM  % Wound base Yellow 30% 03/30/2013 11:06 AM  % Wound base Black 0% 03/30/2013 11:06 AM  % Wound base Other (Comment) 0% 03/30/2013 11:06 AM  Peri-wound Assessment Intact 03/30/2013 11:06 AM  Wound Length (cm) 11 cm 03/20/2013  4:51 PM  Wound Width (cm) 9 cm 03/20/2013  4:51 PM  Wound Depth (cm) 0 cm 03/20/2013  4:51 PM  Margins Unattached edges (unapproximated) 03/30/2013 11:06 AM  Drainage Amount Minimal 03/30/2013 11:06 AM  Drainage Description Serous 03/30/2013 11:06 AM  Treatment Cleansed;Debridement (Selective) 03/30/2013 11:06 AM     Incision (Closed) 02/19/13 Abdomen (Active)  Dressing Type Gauze (Comment);Honeycomb 03/30/2013 11:06 AM  Dressing Clean;Dry;Intact 03/30/2013 11:06 AM  Dressing Change Frequency Daily 03/30/2013 11:06 AM  Site / Wound Assessment Other (Comment) 03/30/2013 11:06 AM  Margins  Unattached edges (unapproximated) 03/30/2013 11:06 AM  Closure None 03/25/2013 12:02 PM  Drainage Amount Minimal 03/30/2013 11:06 AM  Drainage Description Serous 03/30/2013 11:06 AM  Treatment Cleansed 03/30/2013 11:06 AM   Selective Debridement Selective Debridement - Location: sacrum Selective Debridement - Tools Used: Scalpel;Forceps Selective Debridement - Tissue Removed: yellow slough, and necrotixc tissue.   Physical Therapy Assessment and Plan Wound Therapy - Assess/Plan/Recommendations Wound Therapy - Clinical Statement: Pt tolerated well towards debridement focusing on removal of slough and eschar.  Eschar has become less adherant and less sensitive though patient shakes while removign eschar despite noting he is not in pain. Continued with moist to moist dressing with honey dressings and gauze and vaseline for periwound area.   Wound Therapy - Functional Problem List: decr sitting tolerance. Factors Delaying/Impairing Wound Healing: Incontinence;Immobility;Multiple medical problems;Polypharmacy;Tobacco use Hydrotherapy Plan: Debridement;Dressing change;Patient/family education Wound Therapy - Frequency: 3X / week Wound Therapy - Follow Up Recommendations: Skilled nursing facility Wound Plan: Continue with sharps debridement, and application of honey to assist and improve wound bed healing. Patient's slough displays improving  ability to be debrided secondary to less sensitivety.       Goals Wound Therapy Goals - Improve the function of patient's integumentary system by progressing the wound(s) through the phases of wound healing by: Decrease Necrotic Tissue to: 0% Decrease Necrotic Tissue - Progress: Progressing toward goal Increase Granulation Tissue to: 100% Decrease Length/Width/Depth by (cm): 8cm Decrease Length/Width/Depth - Progress: Progressing toward goal Goals/treatment plan/discharge plan were made with and agreed upon by patient/family: No, Patient unable to participate  in goals/treatment/discharge plan and family unavailable Time For Goal Achievement: 2 weeks Wound Therapy - Potential for Goals: Fair  Problem List Patient Active Problem List   Diagnosis Date Noted  . Acute encephalopathy 03/07/2013  . Postoperative intra-abdominal abscess 03/07/2013  . Hydronephrosis, bilateral 03/07/2013  . Pressure ulcer, stage IRW(431.54) 03/07/2013  . UTI (lower urinary tract infection) 02/18/2013  . Sepsis 02/18/2013  . Aspiration pneumonia 02/18/2013  . Acute respiratory failure with hypoxia 02/18/2013  . Acute renal failure 02/18/2013  . Dementia 02/18/2013  . Hematuria 02/18/2013  . 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 Memory loss 03/27/2009  . CELLULITIS, HAND, LEFT 03/23/2009  . FATIGUE 03/15/2009  . ALCOHOL ABUSE 01/30/2008  . GOITER 10/25/2006  . NICOTINE ADDICTION 10/25/2006  . HYPERTENSION 10/25/2006  . COLONIC POLYPS, HX OF 10/25/2006  . BENIGN PROSTATIC HYPERTROPHY, HX OF 10/25/2006    GP Functional Assessment Tool Used: Clinical judgement  Nekita Pita R PT DPT 03/30/2013, 11:52 AM

## 2013-04-01 ENCOUNTER — Ambulatory Visit (HOSPITAL_COMMUNITY)
Admission: RE | Admit: 2013-04-01 | Discharge: 2013-04-01 | Disposition: A | Discharge status: Home / Self Care | Payer: Medicare (Managed Care) | Source: Ambulatory Visit | Attending: Family Medicine | Admitting: Family Medicine

## 2013-04-01 DIAGNOSIS — N179 Acute kidney failure, unspecified: Secondary | ICD-10-CM | POA: Insufficient documentation

## 2013-04-01 DIAGNOSIS — L89109 Pressure ulcer of unspecified part of back, unspecified stage: Secondary | ICD-10-CM | POA: Insufficient documentation

## 2013-04-01 DIAGNOSIS — F17200 Nicotine dependence, unspecified, uncomplicated: Secondary | ICD-10-CM | POA: Insufficient documentation

## 2013-04-01 DIAGNOSIS — I10 Essential (primary) hypertension: Secondary | ICD-10-CM | POA: Insufficient documentation

## 2013-04-01 DIAGNOSIS — L8993 Pressure ulcer of unspecified site, stage 3: Secondary | ICD-10-CM | POA: Insufficient documentation

## 2013-04-01 DIAGNOSIS — IMO0001 Reserved for inherently not codable concepts without codable children: Secondary | ICD-10-CM | POA: Insufficient documentation

## 2013-04-01 NOTE — Progress Notes (Signed)
Physical Therapy - Wound Therapy  Treatment   Patient Details  Name: Jason [name] MRN: [id number] Date of Birth: 04/09/1945  Today's Date: 04/01/2013 Time: 1015-1100 Time Calculation (min): 45 min Charges: 1 debridement Visit#: 8 of 12  Re-eval: 04/13/13  Subjective Subjective Assessment Subjective: Patient reports no pain Patient and Family Stated Goals: For wound to heal.  Date of Onset: 02/19/13  Pain Assessment Pain Assessment Pain Assessment: No/denies pain Pain Score: 0-No pain Faces Pain Scale: No hurt  Wound Therapy Pressure Ulcer Unstageable - Full thickness tissue loss in which the base of the ulcer is covered by slough (yellow, tan, gray, green or brown) and/or eschar (tan, brown or black) in the wound bed. (Active)  Dressing Type Other (Comment);Gauze (Comment);Moist to moist 04/01/2013 12:55 PM  Dressing Changed 04/01/2013 12:55 PM  Dressing Change Frequency Daily 04/01/2013 12:55 PM  State of Healing Early/partial granulation 04/01/2013 12:55 PM  Site / Wound Assessment Yellow;Pink;Bleeding;Red 04/01/2013 12:55 PM  % Wound base Red or Granulating 80% 04/01/2013 12:55 PM  % Wound base Yellow 20% 04/01/2013 12:55 PM  % Wound base Black 0% 04/01/2013 12:55 PM  % Wound base Other (Comment) 0% 04/01/2013 12:55 PM  Peri-wound Assessment Intact 04/01/2013 12:55 PM  Wound Length (cm) 11 cm 03/20/2013  4:51 PM  Wound Width (cm) 9 cm 03/20/2013  4:51 PM  Wound Depth (cm) 0 cm 03/20/2013  4:51 PM  Margins Unattached edges (unapproximated) 04/01/2013 12:55 PM  Drainage Amount Minimal 04/01/2013 12:55 PM  Drainage Description Serous 04/01/2013 12:55 PM  Treatment Cleansed;Debridement (Selective) 04/01/2013 12:55 PM     Incision (Closed) 02/19/13 Abdomen (Active)  Dressing Type Gauze (Comment);Honeycomb 04/01/2013 12:55 PM  Dressing Clean;Dry;Intact 04/01/2013 12:55 PM  Dressing Change Frequency Daily 04/01/2013 12:55 PM  Site / Wound Assessment Other (Comment) 04/01/2013 12:55 PM  Margins Unattached edges  (unapproximated) 04/01/2013 12:55 PM  Closure None 04/01/2013 12:55 PM  Drainage Amount Minimal 04/01/2013 12:55 PM  Drainage Description Serous 04/01/2013 12:55 PM  Treatment Cleansed 04/01/2013 12:55 PM   Selective Debridement Selective Debridement - Location: sacrum Selective Debridement - Tools Used: Scalpel;Forceps Selective Debridement - Tissue Removed: yellow slough   Physical Therapy Assessment and Plan Wound Therapy - Assess/Plan/Recommendations Wound Therapy - Clinical Statement: Pt tolerated treatment well with debridement focusing on removal of remaining slough.  Continued with moist to moist dressing with honey dressings and gauze and vaseline for periwound area.   Wound Therapy - Functional Problem List: decr sitting tolerance. Factors Delaying/Impairing Wound Healing: Incontinence;Immobility;Multiple medical problems;Polypharmacy;Tobacco use Hydrotherapy Plan: Debridement;Dressing change;Patient/family education Wound Therapy - Frequency: 3X / week Wound Therapy - Follow Up Recommendations: Skilled nursing facility Wound Plan: Continue with sharps debridement, and application of honey to assist and improve wound bed healing. Patient's slough displays improving  ability to be debrided secondary to less sensitity.      Goals Wound Therapy Goals - Improve the function of patient's integumentary system by progressing the wound(s) through the phases of wound healing by: Decrease Necrotic Tissue to: 0% Decrease Necrotic Tissue - Progress: Progressing toward goal Increase Granulation Tissue to: 100% Increase Granulation Tissue - Progress: Progressing toward goal Decrease Length/Width/Depth by (cm): 8cm Decrease Length/Width/Depth - Progress: Progressing toward goal Goals/treatment plan/discharge plan were made with and agreed upon by patient/family: No, Patient unable to participate in goals/treatment/discharge plan and family unavailable Time For Goal Achievement: 2 weeks Wound Therapy  - Potential for Goals: Fair  Problem List Patient Active Problem List   Diagnosis Date Noted  . Acute  encephalopathy 03/07/2013  . Postoperative intra-abdominal abscess 03/07/2013  . Hydronephrosis, bilateral 03/07/2013  . Pressure ulcer, stage VZD(638.75) 03/07/2013  . UTI (lower urinary tract infection) 02/18/2013  . Sepsis 02/18/2013  . Aspiration pneumonia 02/18/2013  . Acute respiratory failure with hypoxia 02/18/2013  . Acute renal failure 02/18/2013  . Dementia 02/18/2013  . Hematuria 02/18/2013  . 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 Memory loss 03/27/2009  . CELLULITIS, HAND, LEFT 03/23/2009  . FATIGUE 03/15/2009  . ALCOHOL ABUSE 01/30/2008  . GOITER 10/25/2006  . NICOTINE ADDICTION 10/25/2006  . HYPERTENSION 10/25/2006  . COLONIC POLYPS, HX OF 10/25/2006  . BENIGN PROSTATIC HYPERTROPHY, HX OF 10/25/2006    GP Functional Assessment Tool Used: Clinical judgement  Stasha Naraine R PT DPT 04/01/2013, 12:57 PM

## 2013-04-03 ENCOUNTER — Ambulatory Visit (HOSPITAL_COMMUNITY)
Admit: 2013-04-03 | Discharge: 2013-04-03 | Disposition: A | Discharge status: Home / Self Care | Payer: Medicare (Managed Care) | Attending: Family Medicine | Admitting: Family Medicine

## 2013-04-03 LAB — AFB CULTURE WITH SMEAR (NOT AT ARMC): Acid Fast Smear: NONE SEEN

## 2013-04-03 NOTE — Progress Notes (Signed)
Physical Therapy - Wound Therapy  Treatment   Patient Details  Name: Jason [name] MRN: [id number] Date of Birth: 1945-02-14  Today's Date: 04/03/2013 Time: 1015-1100 Time Calculation (min): 45 min  Charges: Debridement 1 Visit#: 9 of 12  Re-eval: 04/13/13  Subjective Subjective Assessment Subjective: Patient states mild discomfort on his bottom but no pain Patient and Family Stated Goals: For wound to heal.  Date of Onset: 02/19/13  Pain Assessment Pain Assessment Pain Assessment: No/denies pain Pain Score: 0-No pain Faces Pain Scale: No hurt  Wound Therapy Pressure Ulcer Unstageable - Full thickness tissue loss in which the base of the ulcer is covered by slough (yellow, tan, gray, green or brown) and/or eschar (tan, brown or black) in the wound bed. (Active)  Dressing Type Other (Comment);Gauze (Comment);Moist to moist 04/03/2013 12:10 PM  Dressing Changed 04/03/2013 12:10 PM  Dressing Change Frequency Daily 04/03/2013 12:10 PM  State of Healing Early/partial granulation 04/03/2013 12:10 PM  Site / Wound Assessment Yellow;Pink;Bleeding;Red 04/03/2013 12:10 PM  % Wound base Red or Granulating 90% 04/03/2013 12:10 PM  % Wound base Yellow 10% 04/03/2013 12:10 PM  % Wound base Black 0% 04/03/2013 12:10 PM  % Wound base Other (Comment) 0% 04/03/2013 12:10 PM  Peri-wound Assessment Intact 04/03/2013 12:10 PM  Wound Length (cm) 11 cm 03/20/2013  4:51 PM  Wound Width (cm) 9 cm 03/20/2013  4:51 PM  Wound Depth (cm) 0 cm 03/20/2013  4:51 PM  Margins Unattached edges (unapproximated) 04/03/2013 12:10 PM  Drainage Amount Minimal 04/03/2013 12:10 PM  Drainage Description Serous;No odor 04/03/2013 12:10 PM  Treatment Cleansed;Debridement (Selective) 04/03/2013 12:10 PM     Incision (Closed) 02/19/13 Abdomen (Active)  Dressing Type Gauze (Comment);Honeycomb 04/01/2013 12:55 PM  Dressing Clean;Dry;Intact 04/01/2013 12:55 PM  Dressing Change Frequency Daily 04/01/2013 12:55 PM  Site / Wound Assessment Other (Comment)  04/01/2013 12:55 PM  Margins Unattached edges (unapproximated) 04/01/2013 12:55 PM  Closure None 04/01/2013 12:55 PM  Drainage Amount Minimal 04/01/2013 12:55 PM  Drainage Description Serous 04/01/2013 12:55 PM  Treatment Cleansed 04/01/2013 12:55 PM   Selective Debridement Selective Debridement - Location: sacrum Selective Debridement - Tools Used: Scalpel;Forceps Selective Debridement - Tissue Removed: yellow slough   Physical Therapy Assessment and Plan Wound Therapy - Assess/Plan/Recommendations Wound Therapy - Clinical Statement: Pt tolerated treatment well with debridement focusing on removal of remaining slough.  Continued with moist to moist dressing with honey dressings and gauze and vaseline for periwound area.   Wound Therapy - Functional Problem List: decr sitting tolerance. Factors Delaying/Impairing Wound Healing: Incontinence;Immobility;Multiple medical problems;Polypharmacy;Tobacco use Hydrotherapy Plan: Debridement;Dressing change;Patient/family education Wound Therapy - Frequency: 3X / week Wound Therapy - Follow Up Recommendations: Skilled nursing facility Wound Plan: Continue with sharps debridement, and application of honey to assist and improve wound bed healing. Patient's slough displays improving  ability to be debrided secondary to less sensitity.      Goals Wound Therapy Goals - Improve the function of patient's integumentary system by progressing the wound(s) through the phases of wound healing by: Decrease Necrotic Tissue - Progress: Progressing toward goal Increase Granulation Tissue - Progress: Progressing toward goal Decrease Length/Width/Depth - Progress: Progressing toward goal  Problem List Patient Active Problem List   Diagnosis Date Noted  . Acute encephalopathy 03/07/2013  . Postoperative intra-abdominal abscess 03/07/2013  . Hydronephrosis, bilateral 03/07/2013  . Pressure ulcer, stage EPP(295.18) 03/07/2013  . UTI (lower urinary tract infection)  02/18/2013  . Sepsis 02/18/2013  . Aspiration pneumonia 02/18/2013  . Acute respiratory failure  with hypoxia 02/18/2013  . Acute renal failure 02/18/2013  . Dementia 02/18/2013  . Hematuria 02/18/2013  . 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 Memory loss 03/27/2009  . CELLULITIS, HAND, LEFT 03/23/2009  . FATIGUE 03/15/2009  . ALCOHOL ABUSE 01/30/2008  . GOITER 10/25/2006  . NICOTINE ADDICTION 10/25/2006  . HYPERTENSION 10/25/2006  . COLONIC POLYPS, HX OF 10/25/2006  . BENIGN PROSTATIC HYPERTROPHY, HX OF 10/25/2006    GP Functional Assessment Tool Used: Clinical judgement  Vinicius Brockman R PT DPT 04/03/2013, 12:14 PM

## 2013-04-08 ENCOUNTER — Ambulatory Visit (HOSPITAL_COMMUNITY)
Admit: 2013-04-08 | Discharge: 2013-04-08 | Disposition: A | Discharge status: Home / Self Care | Payer: Medicare (Managed Care) | Attending: Family Medicine | Admitting: Family Medicine

## 2013-04-08 NOTE — Progress Notes (Signed)
Physical Therapy - Wound Therapy  Treatment   Patient Details  Name: Jason [name] MRN: [id number] Date of Birth: [birth year]  Today's Date: 04/08/2013 Time: 1015-1100 Time Calculation (min): 45 min Charges: Debridement 28min  Visit#: 10 of 12  Re-eval: 04/13/13  Subjective Subjective Assessment Subjective: Patient states mild discomfort on his bottom but no pain Patient and Family Stated Goals: For wound to heal.  Date of Onset: 02/19/13  Pain Assessment Pain Assessment Pain Assessment: No/denies pain Pain Score: 0-No pain Faces Pain Scale: No hurt  Wound Therapy Pressure Ulcer Unstageable - Full thickness tissue loss in which the base of the ulcer is covered by slough (yellow, tan, gray, green or brown) and/or eschar (tan, brown or black) in the wound bed. (Active)  Dressing Type Other (Comment);Gauze (Comment);Moist to moist 04/08/2013 11:09 AM  Dressing Changed 04/08/2013 11:09 AM  Dressing Change Frequency Daily 04/08/2013 11:09 AM  State of Healing Early/partial granulation 04/08/2013 11:09 AM  Site / Wound Assessment Yellow;Pink;Bleeding;Red 04/08/2013 11:09 AM  % Wound base Red or Granulating 95% 04/08/2013 11:09 AM  % Wound base Yellow 5% 04/08/2013 11:09 AM  % Wound base Black 0% 04/08/2013 11:09 AM  % Wound base Other (Comment) 0% 04/08/2013 11:09 AM  Peri-wound Assessment Intact 04/08/2013 11:09 AM  Wound Length (cm) 8.5 cm 04/08/2013 11:09 AM  Wound Width (cm) 6.5 cm 04/08/2013 11:09 AM  Wound Depth (cm) 0 cm 04/08/2013 11:09 AM  Margins Unattached edges (unapproximated) 04/08/2013 11:09 AM  Drainage Amount Minimal 04/08/2013 11:09 AM  Drainage Description Serous;No odor 04/08/2013 11:09 AM  Treatment Cleansed;Debridement (Selective) 04/08/2013 11:09 AM     Incision (Closed) 02/19/13 Abdomen (Active)  Dressing Type Gauze (Comment);Honeycomb 04/08/2013 11:09 AM  Dressing Clean;Dry;Intact 04/08/2013 11:09 AM  Dressing Change Frequency Daily 04/08/2013 11:09 AM  Site / Wound Assessment Other  (Comment) 04/08/2013 11:09 AM  Margins Unattached edges (unapproximated) 04/08/2013 11:09 AM  Closure None 04/08/2013 11:09 AM  Drainage Amount Minimal 04/08/2013 11:09 AM  Drainage Description Serous 04/08/2013 11:09 AM  Treatment Cleansed 04/08/2013 11:09 AM   Selective Debridement Selective Debridement - Location: sacrum Selective Debridement - Tools Used: Scalpel;Forceps Selective Debridement - Tissue Removed: yellow slough   Physical Therapy Assessment and Plan Wound Therapy - Assess/Plan/Recommendations Wound Therapy - Clinical Statement: Pt tolerated treatment well with debridement focusing on removal of remaining slough.  Continued with moist to moist dressing with honey dressings and gauze and vaseline for periwound area.   Wound Therapy - Functional Problem List: decr sitting tolerance. Factors Delaying/Impairing Wound Healing: Incontinence;Immobility;Multiple medical problems;Polypharmacy;Tobacco use Hydrotherapy Plan: Debridement;Dressing change;Patient/family education Wound Therapy - Follow Up Recommendations: Skilled nursing facility Wound Plan: Continue with sharps debridement, and application of honey to assist and improve wound bed healing for one more session. Patient to be dischardes to skilled nursing for continued wound management following removal of >95% of necrotic tissue.       Goals Wound Therapy Goals - Improve the function of patient's integumentary system by progressing the wound(s) through the phases of wound healing by: Decrease Necrotic Tissue - Progress: Progressing toward goal Increase Granulation Tissue - Progress: Progressing toward goal Decrease Length/Width/Depth - Progress: Progressing toward goal  Problem List Patient Active Problem List   Diagnosis Date Noted  . Acute encephalopathy 03/07/2013  . Postoperative intra-abdominal abscess 03/07/2013  . Hydronephrosis, bilateral 03/07/2013  . Pressure ulcer, stage WFU(932.35) 03/07/2013  . UTI (lower urinary  tract infection) 02/18/2013  . Sepsis 02/18/2013  . Aspiration pneumonia 02/18/2013  . Acute respiratory failure  with hypoxia 02/18/2013  . Acute renal failure 02/18/2013  . Dementia 02/18/2013  . Hematuria 02/18/2013  . 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 Memory loss 03/27/2009  . CELLULITIS, HAND, LEFT 03/23/2009  . FATIGUE 03/15/2009  . ALCOHOL ABUSE 01/30/2008  . GOITER 10/25/2006  . NICOTINE ADDICTION 10/25/2006  . HYPERTENSION 10/25/2006  . COLONIC POLYPS, HX OF 10/25/2006  . BENIGN PROSTATIC HYPERTROPHY, HX OF 10/25/2006    GP Functional Assessment Tool Used: Clinical judgement  Maribelle Hopple R Dezyre Hoefer PT, DPT 04/08/2013, 11:13 AM

## 2013-04-10 ENCOUNTER — Ambulatory Visit (HOSPITAL_COMMUNITY)
Admit: 2013-04-10 | Discharge: 2013-04-10 | Disposition: A | Discharge status: Home / Self Care | Payer: Medicare (Managed Care) | Attending: Family Medicine | Admitting: Family Medicine

## 2013-04-10 NOTE — Progress Notes (Signed)
Physical Therapy - Wound Therapy  Treatment   Patient Details  Name: Jason [name] MRN: [id number] Date of Birth: 07-01-1945  Today's Date: 04/10/2013 Time: 1601-0932 Time Calculation (min): 30 min Charges: Debridement 1 Visit#: 11 of 12  Re-eval: 04/13/13  Subjective Subjective Assessment Subjective: Patient states no pain or discomfort  Pain Assessment Pain Assessment Pain Assessment: No/denies pain  Wound Therapy Pressure Ulcer Unstageable - Full thickness tissue loss in which the base of the ulcer is covered by slough (yellow, tan, gray, green or brown) and/or eschar (tan, brown or black) in the wound bed. (Active)  Dressing Type Other (Comment);Gauze (Comment);Moist to moist 04/10/2013  8:55 AM  Dressing Changed 04/10/2013  8:55 AM  Dressing Change Frequency Daily 04/10/2013  8:55 AM  State of Healing Early/partial granulation 04/08/2013 11:09 AM  Site / Wound Assessment Yellow;Pink;Bleeding;Red 04/10/2013  8:55 AM  % Wound base Red or Granulating 90% 04/10/2013  8:55 AM  % Wound base Yellow 10% 04/10/2013  8:55 AM  % Wound base Black 0% 04/10/2013  8:55 AM  % Wound base Other (Comment) 0% 04/10/2013  8:55 AM  Peri-wound Assessment Intact 04/10/2013  8:55 AM  Wound Length (cm) 8.5 cm 04/10/2013  8:55 AM  Wound Width (cm) 6.5 cm 04/10/2013  8:55 AM  Wound Depth (cm) 1 cm 04/10/2013  8:55 AM  Margins Unattached edges (unapproximated) 04/10/2013  8:55 AM  Drainage Amount Minimal 04/10/2013  8:55 AM  Drainage Description Serous;No odor 04/10/2013  8:55 AM  Treatment Cleansed 04/10/2013  8:55 AM     Incision (Closed) 02/19/13 Abdomen (Active)  Dressing Type Gauze (Comment);Honeycomb 04/08/2013 11:09 AM  Dressing Clean;Dry;Intact 04/08/2013 11:09 AM  Dressing Change Frequency Daily 04/08/2013 11:09 AM  Site / Wound Assessment Other (Comment) 04/08/2013 11:09 AM  Margins Unattached edges (unapproximated) 04/08/2013 11:09 AM  Closure None 04/08/2013 11:09 AM  Drainage Amount Minimal 04/08/2013 11:09 AM   Drainage Description Serous 04/08/2013 11:09 AM  Treatment Cleansed 04/08/2013 11:09 AM   Selective Debridement Selective Debridement - Location: sacrum Selective Debridement - Tools Used: Scalpel;Forceps Selective Debridement - Tissue Removed: yellow slough   Physical Therapy Assessment and Plan Wound Therapy - Assess/Plan/Recommendations Wound Therapy - Clinical Statement: Pt tolerated treatment well with debridement focusing on removal of remaining slough.  Continued with moist to moist dressing with honey dressings and gauze and vaseline for periwound area.   Wound Therapy - Functional Problem List: decr sitting tolerance. Factors Delaying/Impairing Wound Healing: Incontinence;Immobility;Multiple medical problems;Polypharmacy;Tobacco use Hydrotherapy Plan: Debridement;Dressing change;Patient/family education Wound Therapy - Frequency: 3X / week Wound Therapy - Follow Up Recommendations: Skilled nursing facility Wound Plan: Continue with sharps debridement, and application of honey to assist and improve wound bed healing for one more session. Patient requires one more week of 3x PT for wound debridement due depth of wound       Goals Wound Therapy Goals - Improve the function of patient's integumentary system by progressing the wound(s) through the phases of wound healing by: Decrease Necrotic Tissue to: 0% Decrease Necrotic Tissue - Progress: Progressing toward goal Increase Granulation Tissue - Progress: Progressing toward goal Decrease Length/Width/Depth - Progress: Progressing toward goal  Problem List Patient Active Problem List   Diagnosis Date Noted  . Acute encephalopathy 03/07/2013  . Postoperative intra-abdominal abscess 03/07/2013  . Hydronephrosis, bilateral 03/07/2013  . Pressure ulcer, stage TFT(732.20) 03/07/2013  . UTI (lower urinary tract infection) 02/18/2013  . Sepsis 02/18/2013  . Aspiration pneumonia 02/18/2013  . Acute respiratory failure with hypoxia  02/18/2013  .  Acute renal failure 02/18/2013  . Dementia 02/18/2013  . Hematuria 02/18/2013  . 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 Memory loss 03/27/2009  . CELLULITIS, HAND, LEFT 03/23/2009  . FATIGUE 03/15/2009  . ALCOHOL ABUSE 01/30/2008  . GOITER 10/25/2006  . NICOTINE ADDICTION 10/25/2006  . HYPERTENSION 10/25/2006  . COLONIC POLYPS, HX OF 10/25/2006  . BENIGN PROSTATIC HYPERTROPHY, HX OF 10/25/2006    GP    Halim Surrette R Shynia Daleo PT DPT 04/10/2013, 8:59 AM

## 2013-04-13 ENCOUNTER — Ambulatory Visit (HOSPITAL_COMMUNITY)
Admit: 2013-04-13 | Discharge: 2013-04-13 | Disposition: A | Discharge status: Home / Self Care | Payer: Medicare (Managed Care) | Attending: Family Medicine | Admitting: Family Medicine

## 2013-04-13 NOTE — Progress Notes (Addendum)
Physical Therapy - Wound Therapy Re-evaluation  Reassessment   Patient Details  Name: Jason [name] MRN: [id number] Date of Birth: [birth year]  Today's Date: 04/13/2013 Time: 0935-1000 Time Calculation (min): 25 min Visit#: 12 of 24  Re-eval: 05/11/13  Subjective Pt without complaints.  No facial grimacing or complaints of pain.  Pain Assessment None noted Wound Therapy Pressure Ulcer Unstageable - Full thickness tissue loss in which the base of the ulcer is covered by slough (yellow, tan, gray, green or brown) and/or eschar (tan, brown or black) in the wound bed. (Active)  Dressing Type  Moist to moist 2X2 gauze with medihoney gel, ABD and medipore 04/10/2013  8:55 AM  Dressing Changed 04/10/2013  8:55 AM  Dressing Change Frequency Daily 04/10/2013  8:55 AM  State of Healing Early/partial granulation 04/08/2013 11:09 AM  Site / Wound Assessment Yellow;Pink;Bleeding;Red 04/10/2013  8:55 AM  % Wound base Red or Granulating 80% (was 5% on 3/18) 04/13/2013 10:00 AM  % Wound base Yellow 20% (was 55% on 3/18) 04/13/2013 10:00 AM  % Wound base Black 0% (was 40% on 3/18) 04/13/2013 10:00 AM  % Wound base Other (Comment) 0% 04/13/2013 10:00 AM  Peri-wound Assessment Intact 04/13/2013 10:00 AM  Wound Length (cm) 8 cm (was 11cm on 3/18) 04/13/2013 10:00 AM  Wound Width (cm) 6 cm (was 9cm on 3/18) 04/13/2013 10:00 AM  Wound Depth (cm) 0 cm (was 0cm on 3/18) 04/13/2013 10:00 AM  Margins Unattached edges (unapproximated) 04/13/2013 10:00 AM  Drainage Amount Minimal 04/13/2013 10:00 AM  Drainage Description Serous;No odor 04/13/2013 10:00 AM  Treatment Cleansed;Debridement (Selective) 04/13/2013 10:00 AM      Selective Debridement Selective Debridement - Location: sacrum Selective Debridement - Tools Used: Scalpel;Forceps Selective Debridement - Tissue Removed: yellow slough   Physical Therapy Assessment and Plan Wound Assessment:  Wound continues to approximate well with noted increase in granulation  buds.  Slough is mostly central of wound and shallow in depth.  Pt will benefit from further skilled therapy to debride remaining slough and promote closure.   Wound Therapy - Frequency: 3X / week Wound Plan: Continue with sharps debridement for goal of 95-100% granulation.  May want to inquire with nursing care facility regarding cushion for offloading to promote wound healing.        Goals Wound Therapy Goals - Improve the function of patient's integumentary system by progressing the wound(s) through the phases of wound healing by: 1.  Decrease Necrotic Tissue to 0% - Progress: Progressing toward goal 2.  Increase Granulation Tissue to 100%- Progress: Progressing toward goal 3.  Decrease Length/Width/Depth by 8cm - Progress: Progressing toward goal  Problem List Patient Active Problem List   Diagnosis Date Noted  . Acute encephalopathy 03/07/2013  . Postoperative intra-abdominal abscess 03/07/2013  . Hydronephrosis, bilateral 03/07/2013  . Pressure ulcer, stage WCB(762.83) 03/07/2013  . UTI (lower urinary tract infection) 02/18/2013  . Sepsis 02/18/2013  . Aspiration pneumonia 02/18/2013  . Acute respiratory failure with hypoxia 02/18/2013  . Acute renal failure 02/18/2013  . Dementia 02/18/2013  . Hematuria 02/18/2013  . 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 Memory loss 03/27/2009  . CELLULITIS, HAND, LEFT 03/23/2009  . FATIGUE 03/15/2009  . ALCOHOL ABUSE 01/30/2008  . GOITER 10/25/2006  . NICOTINE ADDICTION 10/25/2006  . HYPERTENSION 10/25/2006  . COLONIC POLYPS, HX OF 10/25/2006  . BENIGN PROSTATIC HYPERTROPHY, HX OF 10/25/2006     Teena Irani, PTA/CLT 04/13/2013, 10:43 AM Your signature is required to indicate approval of the treatment plan  as stated above.  Please sign and return making a copy for your files.  You may hard copy or send electronically.  Please check one: ___1.  Approve of this plan  ___2.  Approve of this plan with the following  changes.   ____________________________                             _____________ Physician                                                                      Date

## 2013-04-15 ENCOUNTER — Ambulatory Visit (HOSPITAL_COMMUNITY)
Admit: 2013-04-15 | Discharge: 2013-04-15 | Disposition: A | Discharge status: Home / Self Care | Payer: Medicare (Managed Care) | Attending: Family Medicine | Admitting: Family Medicine

## 2013-04-15 NOTE — Progress Notes (Signed)
Physical Therapy - Wound Therapy  Treatment   Patient Details  Name: Jason [name] MRN: [id number] Date of Birth: 1945/10/09  Today's Date: 04/15/2013 Time: 6269-4854 Time Calculation (min): 40 min Charge: selective debridement <20 cm  Visit#: 13 of 24  Re-eval: 05/11/13  Subjective Subjective Assessment Subjective: Pt stated no pain today.  Pain Assessment Pain Assessment Pain Assessment: No/denies pain  Wound Therapy  04/15/13 1015  Subjective Assessment  Subjective Pt stated no pain today.  Pressure Ulcer  No Date First Assessed or Time First Assessed found.   Location: Sacrum  Location Orientation: Medial  Staging: Unstageable - Full thickness tissue loss in which the base of the ulcer is covered by slough (yellow, tan, gray, green or brown) and/or eschar  Dressing Type Gauze (Comment);ABD (medihoney)  Dressing Changed  Dressing Change Frequency Daily  Site / Wound Assessment Yellow;Pink;Bleeding;Red  % Wound base Red or Granulating 80%  % Wound base Yellow 20%  % Wound base Black 0%  % Wound base Other (Comment) 0%  Peri-wound Assessment Intact  Margins Unattached edges (unapproximated)  Drainage Amount Minimal  Drainage Description Serous;No odor  Treatment Cleansed;Debridement (Selective)  Selective Debridement  Selective Debridement - Location sacrum  Selective Debridement - Tools Used Scalpel;Forceps  Selective Debridement - Tissue Removed yellow slough  Wound Therapy - Assess/Plan/Recommendations  Wound Therapy - Clinical Statement Selective debridement complete to reduce yellow slough, slough tight and adherent, able to remove minimal amouint this session.  Continued with medihoney dressings with 4x4 gauze and abd pad.  Recommended to nurse ofloading cushion for wheelchair to promote healing process.  No c/o pain through session.  Hydrotherapy Plan Debridement;Dressing change;Patient/family education  Wound Therapy - Follow Up Recommendations Skilled nursing  facility  Wound Plan Continue with sharps debridement, and application of honey to assist and improve wound bed healing for one more session. Patient requires one more week of 3x PT for wound debridement due depth of wound     Selective Debridement Selective Debridement - Location: sacrum Selective Debridement - Tools Used: Scalpel;Forceps Selective Debridement - Tissue Removed: yellow slough   Physical Therapy Assessment and Plan Wound Therapy - Assess/Plan/Recommendations Wound Therapy - Clinical Statement: Selective debridement complete to reduce yellow slough, slough tight and adherent, able to remove minimal amouint this session.  Continued with medihoney dressings with 4x4 gauze and abd pad.  Recommended to nurse ofloading cushion for wheelchair to promote healing process.  No c/o pain through session. Hydrotherapy Plan: Debridement;Dressing change;Patient/family education Wound Therapy - Follow Up Recommendations: Skilled nursing facility Wound Plan: Continue with sharps debridement, and application of honey to assist and improve wound bed healing for one more session. Patient requires one more week of 3x PT for wound debridement due depth of wound       Goals Wound Therapy Goals - Improve the function of patient's integumentary system by progressing the wound(s) through the phases of wound healing by: Decrease Necrotic Tissue - Progress: Progressing toward goal Increase Granulation Tissue - Progress: Progressing toward goal Decrease Length/Width/Depth - Progress: Progressing toward goal  Problem List Patient Active Problem List   Diagnosis Date Noted  . Acute encephalopathy 03/07/2013  . Postoperative intra-abdominal abscess 03/07/2013  . Hydronephrosis, bilateral 03/07/2013  . Pressure ulcer, stage OEV(035.00) 03/07/2013  . UTI (lower urinary tract infection) 02/18/2013  . Sepsis 02/18/2013  . Aspiration pneumonia 02/18/2013  . Acute respiratory failure with hypoxia  02/18/2013  . Acute renal failure 02/18/2013  . Dementia 02/18/2013  . Hematuria 02/18/2013  . 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 Memory loss 03/27/2009  . CELLULITIS, HAND, LEFT 03/23/2009  . FATIGUE 03/15/2009  . ALCOHOL ABUSE 01/30/2008  . GOITER 10/25/2006  . NICOTINE ADDICTION 10/25/2006  . HYPERTENSION 10/25/2006  . COLONIC POLYPS, HX OF 10/25/2006  . BENIGN PROSTATIC HYPERTROPHY, HX OF 10/25/2006    GP    Aldona Lento 04/15/2013, 10:21 AM

## 2013-04-17 ENCOUNTER — Ambulatory Visit (HOSPITAL_COMMUNITY)
Admit: 2013-04-17 | Discharge: 2013-04-17 | Disposition: A | Discharge status: Home / Self Care | Payer: Medicare (Managed Care) | Attending: Family Medicine | Admitting: Family Medicine

## 2013-04-17 NOTE — Progress Notes (Signed)
Physical Therapy - Wound Therapy  Treatment   Patient Details  Name: Jason [name] MRN: [id number] Date of Birth: October 28, 1945  Today's Date: 04/17/2013 Time: 6073-7106 Time Calculation (min): 45 min Charges: Debridement 1 Visit#: 14 of 24  Re-eval: 05/11/13  Subjective Subjective Assessment Subjective: Pt stated no pain today, notes minor discomfort during debridement indicatign mild sensation in granulating tissue Date of Onset: 02/19/13  Pain Assessment Pain Assessment Pain Assessment: No/denies pain  Wound Therapy Pressure Ulcer Unstageable - Full thickness tissue loss in which the base of the ulcer is covered by slough (yellow, tan, gray, green or brown) and/or eschar (tan, brown or black) in the wound bed. (Active)  Dressing Type Gauze (Comment);ABD;Honeycomb 04/17/2013  2:00 PM  Dressing Changed 04/17/2013  2:00 PM  Dressing Change Frequency Daily 04/17/2013  2:00 PM  State of Healing Early/partial granulation 04/17/2013  2:00 PM  Site / Wound Assessment Yellow;Pink;Bleeding;Red 04/17/2013  2:00 PM  % Wound base Red or Granulating 85% 04/17/2013  2:00 PM  % Wound base Yellow 15% 04/17/2013  2:00 PM  % Wound base Black 0% 04/17/2013  2:00 PM  % Wound base Other (Comment) 0% 04/17/2013  2:00 PM  Peri-wound Assessment Intact;Pink 04/17/2013  2:00 PM  Wound Length (cm) 8 cm 04/17/2013  1:44 PM  Wound Width (cm) 6.2 cm 04/17/2013  1:44 PM  Wound Depth (cm) 0 cm 04/17/2013  1:44 PM  Margins Unattached edges (unapproximated) 04/17/2013  2:00 PM  Drainage Amount Minimal 04/17/2013  2:00 PM  Drainage Description Serous;No odor 04/17/2013  2:00 PM  Treatment Cleansed;Debridement (Selective) 04/17/2013  1:44 PM     Incision (Closed) 02/19/13 Abdomen (Active)  Dressing Type Gauze (Comment);Honeycomb 04/08/2013 11:09 AM  Dressing Clean;Dry;Intact 04/08/2013 11:09 AM  Dressing Change Frequency Daily 04/08/2013 11:09 AM  Site / Wound Assessment Other (Comment) 04/08/2013 11:09 AM  Margins Unattached edges  (unapproximated) 04/08/2013 11:09 AM  Closure None 04/08/2013 11:09 AM  Drainage Amount Minimal 04/08/2013 11:09 AM  Drainage Description Serous 04/08/2013 11:09 AM  Treatment Cleansed 04/08/2013 11:09 AM   Selective Debridement Selective Debridement - Location: sacrum Selective Debridement - Tools Used: Scalpel;Forceps Selective Debridement - Tissue Removed: yellow slough   Physical Therapy Assessment and Plan Wound Therapy - Assess/Plan/Recommendations Wound Therapy - Clinical Statement: Selective debridement complete to reduce yellow slough, slough tight and adherent, able to remove minimal amouint this session.  Continued with medihoney dressings with 4x4 gauze and abd pad.  Recommended to nurse ofloading cushion for wheelchair to promote healing process.  No c/o pain through session. Wound Therapy - Functional Problem List: decr sitting tolerance. Factors Delaying/Impairing Wound Healing: Incontinence;Immobility;Multiple medical problems;Polypharmacy;Tobacco use Hydrotherapy Plan: Debridement;Dressing change;Patient/family education Wound Therapy - Frequency: 3X / week Wound Therapy - Follow Up Recommendations: Skilled nursing facility Wound Plan: Continue with sharps debridement, and application of honey to assist and improve wound bed healing for one more session. Patient requires one more week of 3x PT for wound debridement due depth of wound       Goals Wound Therapy Goals - Improve the function of patient's integumentary system by progressing the wound(s) through the phases of wound healing by: Increase Granulation Tissue to: 100% Increase Granulation Tissue - Progress: Progressing toward goal Decrease Length/Width/Depth by (cm): 8cm Decrease Length/Width/Depth - Progress: Progressing toward goal  Problem List Patient Active Problem List   Diagnosis Date Noted  . Acute encephalopathy 03/07/2013  . Postoperative intra-abdominal abscess 03/07/2013  . Hydronephrosis, bilateral  03/07/2013  . Pressure ulcer, stage YIR(485.46) 03/07/2013  .  UTI (lower urinary tract infection) 02/18/2013  . Sepsis 02/18/2013  . Aspiration pneumonia 02/18/2013  . Acute respiratory failure with hypoxia 02/18/2013  . Acute renal failure 02/18/2013  . Dementia 02/18/2013  . Hematuria 02/18/2013  . 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 Memory loss 03/27/2009  . CELLULITIS, HAND, LEFT 03/23/2009  . FATIGUE 03/15/2009  . ALCOHOL ABUSE 01/30/2008  . GOITER 10/25/2006  . NICOTINE ADDICTION 10/25/2006  . HYPERTENSION 10/25/2006  . COLONIC POLYPS, HX OF 10/25/2006  . BENIGN PROSTATIC HYPERTROPHY, HX OF 10/25/2006    GP    Saahil Herbster R Montrell Cessna PT DPT 04/17/2013, 2:09 PM

## 2013-04-20 ENCOUNTER — Ambulatory Visit (HOSPITAL_COMMUNITY): Payer: Medicare (Managed Care) | Admitting: *Deleted

## 2013-04-20 ENCOUNTER — Ambulatory Visit (INDEPENDENT_AMBULATORY_CARE_PROVIDER_SITE_OTHER): Payer: Medicare (Managed Care) | Admitting: Infectious Diseases

## 2013-04-20 ENCOUNTER — Encounter: Payer: Self-pay | Admitting: Infectious Diseases

## 2013-04-20 VITALS — BP 137/77 | HR 97 | Temp 97.5°F

## 2013-04-20 DIAGNOSIS — T8143XA Infection following a procedure, organ and space surgical site, initial encounter: Secondary | ICD-10-CM

## 2013-04-20 DIAGNOSIS — L8993 Pressure ulcer of unspecified site, stage 3: Secondary | ICD-10-CM | POA: Diagnosis not present

## 2013-04-20 DIAGNOSIS — T8140XA Infection following a procedure, unspecified, initial encounter: Secondary | ICD-10-CM

## 2013-04-20 DIAGNOSIS — K651 Peritoneal abscess: Secondary | ICD-10-CM | POA: Diagnosis not present

## 2013-04-20 DIAGNOSIS — T8149XA Infection following a procedure, other surgical site, initial encounter: Principal | ICD-10-CM

## 2013-04-20 NOTE — Progress Notes (Signed)
   Subjective:    Patient ID: Jason [name], male    DOB: October 18, 1945, 68 y.o.   MRN: [id number]  HPI 68 yo M with dementia, bladder cancer status post removal of tumor with (2/13 from [facility] after a TURP procedure) subsequent admission for severe sepsis secondary to necrotizing fasciitis of anterior abdominal wall, and acute renal failure. He was initially seen at [facility] on 2/18 after he accidentally pulled out his Foley catheter. Patient was then transferred to [facility] under the critical care service. General surgery and urology were consulted. Patient subsequently underwent exploratory laparotomy and drainage by general surgery and urology on 02/20/2013.  Wound and blood cultures are positive for Escherichia coli, he was d/c home on Rocephin and Flagyl for 4 weeks (beginning 02/19/13).  At home, he developed fever, chills confusion leading to readmission on 3/7. A new pelvic fluid collection was noted that was in communication with the bladder. There was also question of a very faint, patchy left lower lobe infiltrate. His antibiotic therapy was broadened. He has defervesced and improved. New cultures from abscess showed a gram-negative rod on Gram stain but nothing grew on culture. Patient was discharged home on IV ertapenem which he completed on 3-17. He was seen in ID clinic on that date and was changed to augmentin which he has remained on.  He states he has had f/c. He is staying at a SNF. Still has wounds that he gets wound care for. He is here with his daughter who does not feel like his wound is healing.  Has uroostomy drainage bag.  Has surgical f/u 04/21/13.   Review of Systems  Constitutional: Positive for fever and chills.  Gastrointestinal: Negative for diarrhea and constipation.  Genitourinary: Negative for difficulty urinating.       Objective:   Physical Exam  Constitutional: He appears well-developed and well-nourished.  HENT:    Mouth/Throat: No oropharyngeal exudate.  Eyes: EOM are normal. Pupils are equal, round, and reactive to light.  Cardiovascular: Normal rate, regular rhythm and normal heart sounds.   Pulmonary/Chest: Effort normal and breath sounds normal.  Abdominal: Soft. Bowel sounds are normal. He exhibits no distension. There is no tenderness.    Skin:     Psychiatric:  Knows that he is in [place], knows that president is Obama. Does not know month or year or specifically where he is at.           Assessment & Plan:

## 2013-04-20 NOTE — Assessment & Plan Note (Signed)
Will send him to [facility].

## 2013-04-20 NOTE — Assessment & Plan Note (Signed)
At this point he has received 2 months of antibiotics. Will stop his these. He has not had fever at the SNF since 3-29. He has surgical f/u tomorrow. Will see him back prn.

## 2013-04-21 ENCOUNTER — Inpatient Hospital Stay (HOSPITAL_COMMUNITY): Admit: 2013-04-21 | Payer: Medicare (Managed Care) | Admitting: Physical Therapy

## 2013-04-22 ENCOUNTER — Ambulatory Visit (HOSPITAL_COMMUNITY)
Admit: 2013-04-22 | Discharge: 2013-04-22 | Disposition: A | Discharge status: Home / Self Care | Payer: Medicare (Managed Care) | Attending: Family Medicine | Admitting: Family Medicine

## 2013-04-22 NOTE — Progress Notes (Signed)
Physical Therapy - Wound Therapy  Treatment   Patient Details  Name: Jason [name] MRN: [id number] Date of Birth: March 31, 1945  Today's Date: 04/22/2013 Time: 7829-5621 Time Calculation (min): 30 min Charge: selective debridement <20 cm  Visit#: 15 of 24  Re-eval: 05/11/13  Subjective Subjective Assessment Subjective: No pain, pt more mobile this session.   Pain Assessment Pain Assessment Pain Assessment: No/denies pain  Wound Therapy  04/22/13 1317  Subjective Assessment  Subjective No pain, pt more mobile this session.   Pressure Ulcer  No Date First Assessed or Time First Assessed found.   Location: Sacrum  Location Orientation: Medial  Staging: Unstageable - Full thickness tissue loss in which the base of the ulcer is covered by slough (yellow, tan, gray, green or brown) and/or eschar  Dressing Type Gauze (Comment);ABD;Honeycomb  Dressing Changed  Dressing Change Frequency Daily  State of Healing Early/partial granulation  Site / Wound Assessment Yellow;Pink;Bleeding;Red  % Wound base Red or Granulating 85%  % Wound base Yellow 15%  % Wound base Black 0%  % Wound base Other (Comment) 0%  Peri-wound Assessment Intact;Pink  Margins Unattached edges (unapproximated)  Drainage Amount Minimal  Drainage Description Serous;No odor  Selective Debridement  Selective Debridement - Location sacrum  Selective Debridement - Tools Used Scalpel;Forceps  Selective Debridement - Tissue Removed yellow slough  Wound Therapy - Assess/Plan/Recommendations  Wound Therapy - Clinical Statement Increase ease with removal of yellow slough from middle of wound bed believe releated to the medihoney dressings with use of scalpel and forceps.  Continued with medihoney, gauze and sacral adhesion pads.  No reports of pain this session.  Pt with improve mobility noted, no assistance required with transfer today.  Continued the recommendation on MD note for offloading cushion to promote healing while  sitting in wheelchair as much as pt does.    Wound Plan Continue with sharps debridement, and application of honey to assist and improve wound bed healing for one more session. Patient requires one more week of 3x PT for wound debridement due depth of wound     Selective Debridement Selective Debridement - Location: sacrum Selective Debridement - Tools Used: Scalpel;Forceps Selective Debridement - Tissue Removed: yellow slough   Physical Therapy Assessment and Plan Wound Therapy - Assess/Plan/Recommendations Wound Therapy - Clinical Statement: Increase ease with removal of yellow slough from middle of wound bed believe releated to the medihoney dressings with use of scalpel and forceps.  Continued with medihoney, gauze and sacral adhesion pads.  No reports of pain this session.  Pt with improve mobility noted, no assistance required with transfer today.  Continued the recommendation on MD note for offloading cushion to promote healing while sitting in wheelchair as much as pt does.   Wound Plan: Continue with sharps debridement, and application of honey to assist and improve wound bed healing for one more session. Patient requires one more week of 3x PT for wound debridement due depth of wound       Goals    Problem List Patient Active Problem List   Diagnosis Date Noted  . Acute encephalopathy 03/07/2013  . Postoperative intra-abdominal abscess 03/07/2013  . Hydronephrosis, bilateral 03/07/2013  . Pressure ulcer, stage HYQ(657.84) 03/07/2013  . UTI (lower urinary tract infection) 02/18/2013  . Sepsis 02/18/2013  . Aspiration pneumonia 02/18/2013  . Acute respiratory failure with hypoxia 02/18/2013  . Acute renal failure 02/18/2013  . Dementia 02/18/2013  . Hematuria 02/18/2013  . 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 Memory loss 03/27/2009  .  CELLULITIS, HAND, LEFT 03/23/2009  . FATIGUE 03/15/2009  . ALCOHOL ABUSE 01/30/2008  . GOITER 10/25/2006  . NICOTINE ADDICTION 10/25/2006   . HYPERTENSION 10/25/2006  . COLONIC POLYPS, HX OF 10/25/2006  . BENIGN PROSTATIC HYPERTROPHY, HX OF 10/25/2006    GP    Aldona Lento 04/22/2013, 1:22 PM

## 2013-04-23 ENCOUNTER — Ambulatory Visit (HOSPITAL_COMMUNITY)
Admit: 2013-04-23 | Discharge: 2013-04-23 | Disposition: A | Discharge status: Home / Self Care | Payer: Medicare (Managed Care) | Attending: Family Medicine | Admitting: Family Medicine

## 2013-04-23 NOTE — Progress Notes (Signed)
Physical Therapy - Wound Therapy  Treatment   Patient Details  Name: Jason [name] MRN: [id number] Date of Birth: 01-12-1945  Today's Date: 04/23/2013 Time: 1025-1055 Time Calculation (min): 30 min Charge: selective debridement <20 cm  Visit#: 16 of 24  Re-eval: 05/11/13  Subjective Subjective Assessment Subjective: Pt ambulated into dept today, no reports of pain.  Pain Assessment Pain Assessment Pain Assessment: No/denies pain  Wound Therapy  04/23/13 1236  Subjective Assessment  Subjective Pt ambulated into dept today, no reports of pain.  Pressure Ulcer  No Date First Assessed or Time First Assessed found.   Location: Sacrum  Location Orientation: Medial  Staging: Unstageable - Full thickness tissue loss in which the base of the ulcer is covered by slough (yellow, tan, gray, green or brown) and/or eschar  Dressing Type Gauze (Comment);ABD;Honeycomb  Dressing Changed  Dressing Change Frequency Daily  State of Healing Early/partial granulation  Site / Wound Assessment Yellow;Pink;Bleeding;Red  % Wound base Red or Granulating 90%  % Wound base Yellow 10%  % Wound base Black 0%  % Wound base Other (Comment) 0%  Peri-wound Assessment Intact;Pink  Margins Unattached edges (unapproximated)  Drainage Amount Minimal  Drainage Description Serous;No odor  Treatment Cleansed;Debridement (Selective)  Selective Debridement  Selective Debridement - Location sacrum  Selective Debridement - Tools Used Scalpel;Forceps  Selective Debridement - Tissue Removed yellow slough  Wound Therapy - Assess/Plan/Recommendations  Wound Therapy - Clinical Statement Improved granulation especially on Lt side of sacral wound. Able to remove yellow slough with increase ease of removal following dressing of medihoney.  Continued with medihoney, 4x4 gauze and sacral adhesive pad.  Wound Plan Continue with sharps debridement, and application of honey to assist and improve wound bed healing for one more  session. Patient requires one more week of 3x PT for wound debridement due depth of wound     Selective Debridement Selective Debridement - Location: sacrum Selective Debridement - Tools Used: Scalpel;Forceps Selective Debridement - Tissue Removed: yellow slough   Physical Therapy Assessment and Plan Wound Therapy - Assess/Plan/Recommendations Wound Therapy - Clinical Statement: Improved granulation especially on Lt side of sacral wound. Able to remove yellow slough with increase ease of removal following dressing of medihoney.  Continued with medihoney, 4x4 gauze and sacral adhesive pad. Wound Plan: Continue with sharps debridement, and application of honey to assist and improve wound bed healing for one more session. Patient requires one more week of 3x PT for wound debridement due depth of wound       Goals    Problem List Patient Active Problem List   Diagnosis Date Noted  . Acute encephalopathy 03/07/2013  . Postoperative intra-abdominal abscess 03/07/2013  . Hydronephrosis, bilateral 03/07/2013  . Pressure ulcer, stage XIH(038.88) 03/07/2013  . UTI (lower urinary tract infection) 02/18/2013  . Sepsis 02/18/2013  . Aspiration pneumonia 02/18/2013  . Acute respiratory failure with hypoxia 02/18/2013  . Acute renal failure 02/18/2013  . Dementia 02/18/2013  . Hematuria 02/18/2013  . 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 Memory loss 03/27/2009  . CELLULITIS, HAND, LEFT 03/23/2009  . FATIGUE 03/15/2009  . ALCOHOL ABUSE 01/30/2008  . GOITER 10/25/2006  . NICOTINE ADDICTION 10/25/2006  . HYPERTENSION 10/25/2006  . COLONIC POLYPS, HX OF 10/25/2006  . BENIGN PROSTATIC HYPERTROPHY, HX OF 10/25/2006    GP    Aldona Lento 04/23/2013, 12:41 PM

## 2013-04-24 ENCOUNTER — Ambulatory Visit (HOSPITAL_COMMUNITY)
Admit: 2013-04-24 | Discharge: 2013-04-24 | Disposition: A | Discharge status: Home / Self Care | Payer: Medicare (Managed Care) | Attending: Family Medicine | Admitting: Family Medicine

## 2013-04-24 NOTE — Progress Notes (Signed)
Physical Therapy - Wound Therapy  Treatment   Patient Details  Name: Jason [name] MRN: [id number] Date of Birth: [birth year]  Today's Date: 04/24/2013 Time: 4098-1191 Time Calculation (min): 35 min  Charges: Selective debridement  Visit#: 17 of 24  Re-eval: 05/11/13  Subjective Subjective Assessment Subjective: Pt ambulated into dept today, no reports of pain, notes mild discomfort over periwound area with sharp/dull sensitivity testing.  Patient and Family Stated Goals: For wound to heal.  Date of Onset: 02/19/13  Pain Assessment Pain Assessment Pain Assessment: No/denies pain Pain Score: 0-No pain  Wound Therapy Pressure Ulcer Unstageable - Full thickness tissue loss in which the base of the ulcer is covered by slough (yellow, tan, gray, green or brown) and/or eschar (tan, brown or black) in the wound bed. (Active)  Dressing Type Gauze (Comment);Honeycomb;ABD 04/24/2013  1:50 PM  Dressing Changed 04/24/2013  1:50 PM  Dressing Change Frequency Daily 04/24/2013  1:50 PM  State of Healing Early/partial granulation 04/24/2013  1:50 PM  Site / Wound Assessment Yellow;Pink;Bleeding;Red 04/24/2013  1:50 PM  % Wound base Red or Granulating 90% 04/24/2013  1:50 PM  % Wound base Yellow 10% 04/24/2013  1:50 PM  % Wound base Black 0% 04/24/2013  1:50 PM  % Wound base Other (Comment) 0% 04/24/2013  1:50 PM  Peri-wound Assessment Intact;Pink 04/23/2013 12:36 PM  Wound Length (cm) 6 cm 04/24/2013  1:50 PM  Wound Width (cm) 4 cm 04/24/2013  1:50 PM  Wound Depth (cm) 0.5 cm 04/24/2013  1:50 PM  Margins Unattached edges (unapproximated) 04/24/2013  1:50 PM  Drainage Amount Minimal 04/24/2013  1:50 PM  Drainage Description Serous;No odor 04/24/2013  1:50 PM  Treatment Debridement (Selective);Cleansed 04/24/2013  1:50 PM     Incision (Closed) 02/19/13 Abdomen (Active)  Dressing Type Gauze (Comment);Honeycomb 04/08/2013 11:09 AM  Dressing Clean;Dry;Intact 04/08/2013 11:09 AM  Dressing Change Frequency Daily  04/08/2013 11:09 AM  Site / Wound Assessment Other (Comment) 04/08/2013 11:09 AM  Margins Unattached edges (unapproximated) 04/08/2013 11:09 AM  Closure None 04/08/2013 11:09 AM  Drainage Amount Minimal 04/08/2013 11:09 AM  Drainage Description Serous 04/08/2013 11:09 AM  Treatment Cleansed 04/08/2013 11:09 AM   Selective Debridement Selective Debridement - Location: sacrum Selective Debridement - Tools Used: Scalpel;Forceps Selective Debridement - Tissue Removed: yellow slough   Physical Therapy Assessment and Plan Wound Therapy - Assess/Plan/Recommendations Wound Therapy - Clinical Statement: Improved granulation especially on Lt side of sacral wound resulting in decreased wound size. Able to remove yellow slough with increase ease of removal following dressing of medihoney, thoguh slough in moddle of wound remains adherant.  Continued with medihoney, 4x4 gauze and sacral adhesive pad. Wound Therapy - Functional Problem List: decr sitting tolerance. Factors Delaying/Impairing Wound Healing: Incontinence;Immobility;Multiple medical problems;Polypharmacy;Tobacco use Hydrotherapy Plan: Debridement;Dressing change;Patient/family education Wound Therapy - Frequency: 3X / week Wound Therapy - Follow Up Recommendations: Skilled nursing facility Wound Plan: Continue with sharps debridement, and application of honey to assist and improve wound bed healing for one more session. Patient requires one more week of 3x PT for wound debridement due depth of wound       Goals Wound Therapy Goals - Improve the function of patient's integumentary system by progressing the wound(s) through the phases of wound healing by: Decrease Necrotic Tissue - Progress: Progressing toward goal Increase Granulation Tissue - Progress: Progressing toward goal Decrease Length/Width/Depth - Progress: Progressing toward goal  Problem List Patient Active Problem List   Diagnosis Date Noted  . Acute encephalopathy 03/07/2013  .  Postoperative intra-abdominal abscess 03/07/2013  . Hydronephrosis, bilateral 03/07/2013  . Pressure ulcer, stage XBW(620.35) 03/07/2013  . UTI (lower urinary tract infection) 02/18/2013  . Sepsis 02/18/2013  . Aspiration pneumonia 02/18/2013  . Acute respiratory failure with hypoxia 02/18/2013  . Acute renal failure 02/18/2013  . Dementia 02/18/2013  . Hematuria 02/18/2013  . 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 Memory loss 03/27/2009  . CELLULITIS, HAND, LEFT 03/23/2009  . FATIGUE 03/15/2009  . ALCOHOL ABUSE 01/30/2008  . GOITER 10/25/2006  . NICOTINE ADDICTION 10/25/2006  . HYPERTENSION 10/25/2006  . COLONIC POLYPS, HX OF 10/25/2006  . BENIGN PROSTATIC HYPERTROPHY, HX OF 10/25/2006    GP    Lina Hitch R Annah Jasko PT DPT 04/24/2013, 1:55 PM

## 2013-04-28 ENCOUNTER — Ambulatory Visit (HOSPITAL_COMMUNITY)
Admit: 2013-04-28 | Discharge: 2013-04-28 | Disposition: A | Discharge status: Home / Self Care | Payer: Medicare (Managed Care) | Attending: Family Medicine | Admitting: Family Medicine

## 2013-04-28 NOTE — Progress Notes (Signed)
Physical Therapy - Wound Therapy  Treatment   Patient Details  Name: Jason [name] MRN: [id number] Date of Birth: 1945-03-01  Today's Date: 04/28/2013 Time: 1191-4782 Time Calculation (min): 25 min Visit#: 18 of 24  Re-eval: 05/11/13 Charges:  Deb <20cm  Subjective Subjective Assessment Subjective: Pt continues to ambulate into department.  Reports no pain or changes.  Pain Assessment Pain Assessment Pain Assessment: No/denies pain  Wound Therapy Pressure Ulcer Unstageable - Full thickness tissue loss in which the base of the ulcer is covered by slough (yellow, tan, gray, green or brown) and/or eschar (tan, brown or black) in the wound bed. (Active)  Dressing Type Gauze (Comment), medihoney gel, sacral bandage 04/28/2013  1:03 PM  Dressing Changed 04/28/2013  1:03 PM  Dressing Change Frequency Daily 04/28/2013  1:03 PM  State of Healing Early/partial granulation 04/24/2013  1:50 PM  Site / Wound Assessment Clean;Dry;Pink;Yellow 04/28/2013  1:03 PM  % Wound base Red or Granulating 75% 04/28/2013  1:03 PM  % Wound base Yellow 25% 04/28/2013  1:03 PM  % Wound base Black 0% 04/28/2013  1:03 PM  % Wound base Other (Comment) 0% 04/28/2013  1:03 PM  Peri-wound Assessment Intact;Pink 04/23/2013 12:36 PM  Wound Length (cm) 6 cm 04/24/2013  1:50 PM  Wound Width (cm) 4 cm 04/24/2013  1:50 PM  Wound Depth (cm) 0.5 cm 04/24/2013  1:50 PM  Margins Unattached edges (unapproximated) 04/28/2013  1:03 PM  Drainage Amount Minimal 04/28/2013  1:03 PM  Drainage Description Serous;No odor 04/28/2013  1:03 PM  Treatment Cleansed;Debridement (Selective) 04/28/2013  1:03 PM      Selective Debridement Selective Debridement - Location: sacrum Selective Debridement - Tools Used: Scalpel;Forceps Selective Debridement - Tissue Removed: yellow slough   Physical Therapy Assessment and Plan Wound Therapy - Assess/Plan/Recommendations Wound Therapy - Clinical Statement: Able to remove yellow slough with increase ease  using scapel, however unable to remove all slough.  Some depth noted in Rt side of wound with hypergranulation around Lt border.  Extra gauze placed in bandage to increase compression and decrease hypergranulation.  Continued with medihoney, 4x4 gauze and sacral adhesive pad. Wound Plan: Continue with sharps debridement, and application of honey to assist and improve wound bed healing of wound.         Problem List Patient Active Problem List   Diagnosis Date Noted  . Acute encephalopathy 03/07/2013  . Postoperative intra-abdominal abscess 03/07/2013  . Hydronephrosis, bilateral 03/07/2013  . Pressure ulcer, stage NFA(213.08) 03/07/2013  . UTI (lower urinary tract infection) 02/18/2013  . Sepsis 02/18/2013  . Aspiration pneumonia 02/18/2013  . Acute respiratory failure with hypoxia 02/18/2013  . Acute renal failure 02/18/2013  . Dementia 02/18/2013  . Hematuria 02/18/2013  . 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 Memory loss 03/27/2009  . CELLULITIS, HAND, LEFT 03/23/2009  . FATIGUE 03/15/2009  . ALCOHOL ABUSE 01/30/2008  . GOITER 10/25/2006  . NICOTINE ADDICTION 10/25/2006  . HYPERTENSION 10/25/2006  . COLONIC POLYPS, HX OF 10/25/2006  . BENIGN PROSTATIC HYPERTROPHY, HX OF 10/25/2006    Teena Irani, PTA/CLT 04/28/2013, 1:08 PM

## 2013-04-29 ENCOUNTER — Ambulatory Visit (HOSPITAL_COMMUNITY): Payer: Medicare (Managed Care)

## 2013-04-30 ENCOUNTER — Ambulatory Visit (HOSPITAL_COMMUNITY)
Admit: 2013-04-30 | Discharge: 2013-04-30 | Disposition: A | Discharge status: Home / Self Care | Payer: Medicare (Managed Care) | Attending: Family Medicine | Admitting: Family Medicine

## 2013-04-30 NOTE — Progress Notes (Signed)
Physical Therapy - Wound Therapy  Treatment   Patient Details  Name: Jason [name] MRN: [id number] Date of Birth: [birth year]  Today's Date: 04/30/2013 Time: 1115-1140 Time Calculation (min): 25 min Charge: selective debridement < 20 cm  Visit#: 19 of 24  Re-eval: 05/11/13  Subjective Subjective Assessment Subjective: Pain free today, pt ambulated into dept.   Pain Assessment Pain Assessment Pain Assessment: No/denies pain  Wound Therapy  04/30/13 1249  Subjective Assessment  Subjective Pain free today, pt ambulated into dept.   Pressure Ulcer  No Date First Assessed or Time First Assessed found.   Location: Sacrum  Location Orientation: Medial  Staging: Unstageable - Full thickness tissue loss in which the base of the ulcer is covered by slough (yellow, tan, gray, green or brown) and/or eschar  Dressing Type Gauze (Comment)  Dressing Changed  Dressing Change Frequency Daily  State of Healing Early/partial granulation  Site / Wound Assessment Clean;Dry;Pink;Yellow  % Wound base Red or Granulating 80%  % Wound base Yellow 20%  % Wound base Black 0%  Margins Unattached edges (unapproximated)  Drainage Amount Minimal  Drainage Description Serous;No odor  Treatment Cleansed;Debridement (Selective)  Selective Debridement  Selective Debridement - Location sacrum  Selective Debridement - Tools Used Scalpel;Forceps  Selective Debridement - Tissue Removed yellow slough  Wound Therapy - Assess/Plan/Recommendations  Wound Therapy - Clinical Statement Wound continues to improve in granulation tissue with increased ease of slough removal closer to perimeter, center base of wound slough remain adherent.  Extra gauze placed in bandage to increase compression and decrease hypergranulation.  Continued with medihoney, 4x4 gauze and sacral adhesive pad.  Wound Plan Continue with sharps debridement, and application of honey to assist and improve wound bed healing for one more session. Patient  requires one more week of 3x PT for wound debridement due depth of wound     Selective Debridement Selective Debridement - Location: sacrum Selective Debridement - Tools Used: Scalpel;Forceps Selective Debridement - Tissue Removed: yellow slough   Physical Therapy Assessment and Plan Wound Therapy - Assess/Plan/Recommendations Wound Therapy - Clinical Statement: Wound continues to improve in granulation tissue with increased ease of slough removal closer to perimeter, center base of wound slough remain adherent.  Extra gauze placed in bandage to increase compression and decrease hypergranulation.  Continued with medihoney, 4x4 gauze and sacral adhesive pad. Wound Plan: Continue with sharps debridement, and application of honey to assist and improve wound bed healing for one more session. Patient requires one more week of 3x PT for wound debridement due depth of wound       Goals    Problem List Patient Active Problem List   Diagnosis Date Noted  . Acute encephalopathy 03/07/2013  . Postoperative intra-abdominal abscess 03/07/2013  . Hydronephrosis, bilateral 03/07/2013  . Pressure ulcer, stage LSL(373.42) 03/07/2013  . UTI (lower urinary tract infection) 02/18/2013  . Sepsis 02/18/2013  . Aspiration pneumonia 02/18/2013  . Acute respiratory failure with hypoxia 02/18/2013  . Acute renal failure 02/18/2013  . Dementia 02/18/2013  . Hematuria 02/18/2013  . 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 Memory loss 03/27/2009  . CELLULITIS, HAND, LEFT 03/23/2009  . FATIGUE 03/15/2009  . ALCOHOL ABUSE 01/30/2008  . GOITER 10/25/2006  . NICOTINE ADDICTION 10/25/2006  . HYPERTENSION 10/25/2006  . COLONIC POLYPS, HX OF 10/25/2006  . BENIGN PROSTATIC HYPERTROPHY, HX OF 10/25/2006    GP    Aldona Lento 04/30/2013, 12:54 PM

## 2013-05-06 ENCOUNTER — Ambulatory Visit
Admission: RE | Admit: 2013-05-06 | Discharge: 2013-05-06 | Disposition: A | Discharge status: Home / Self Care | Payer: Medicare (Managed Care) | Source: Ambulatory Visit | Attending: Family Medicine | Admitting: Family Medicine

## 2013-05-06 ENCOUNTER — Other Ambulatory Visit: Payer: Self-pay | Admitting: Family Medicine

## 2013-05-06 DIAGNOSIS — C679 Malignant neoplasm of bladder, unspecified: Secondary | ICD-10-CM

## 2013-06-09 ENCOUNTER — Emergency Department (HOSPITAL_COMMUNITY)
Admission: EM | Admit: 2013-06-09 | Discharge: 2013-06-09 | Disposition: A | Discharge status: Home / Self Care | Payer: Medicare (Managed Care) | Attending: Emergency Medicine | Admitting: Emergency Medicine

## 2013-06-09 ENCOUNTER — Encounter (HOSPITAL_COMMUNITY): Payer: Self-pay | Admitting: Emergency Medicine

## 2013-06-09 DIAGNOSIS — J449 Chronic obstructive pulmonary disease, unspecified: Secondary | ICD-10-CM | POA: Insufficient documentation

## 2013-06-09 DIAGNOSIS — J4489 Other specified chronic obstructive pulmonary disease: Secondary | ICD-10-CM | POA: Insufficient documentation

## 2013-06-09 DIAGNOSIS — Z8551 Personal history of malignant neoplasm of bladder: Secondary | ICD-10-CM | POA: Insufficient documentation

## 2013-06-09 DIAGNOSIS — F0390 Unspecified dementia without behavioral disturbance: Secondary | ICD-10-CM | POA: Insufficient documentation

## 2013-06-09 DIAGNOSIS — Y846 Urinary catheterization as the cause of abnormal reaction of the patient, or of later complication, without mention of misadventure at the time of the procedure: Secondary | ICD-10-CM | POA: Diagnosis not present

## 2013-06-09 DIAGNOSIS — T83018A Breakdown (mechanical) of other indwelling urethral catheter, initial encounter: Secondary | ICD-10-CM

## 2013-06-09 DIAGNOSIS — F17200 Nicotine dependence, unspecified, uncomplicated: Secondary | ICD-10-CM | POA: Insufficient documentation

## 2013-06-09 DIAGNOSIS — I10 Essential (primary) hypertension: Secondary | ICD-10-CM | POA: Diagnosis not present

## 2013-06-09 DIAGNOSIS — Z79899 Other long term (current) drug therapy: Secondary | ICD-10-CM | POA: Diagnosis not present

## 2013-06-09 DIAGNOSIS — Z8673 Personal history of transient ischemic attack (TIA), and cerebral infarction without residual deficits: Secondary | ICD-10-CM | POA: Insufficient documentation

## 2013-06-09 DIAGNOSIS — T83091A Other mechanical complication of indwelling urethral catheter, initial encounter: Secondary | ICD-10-CM | POA: Diagnosis present

## 2013-06-09 NOTE — ED Provider Notes (Signed)
CSN: [id number]     Arrival date & time 06/09/13  0725 History   First MD Initiated Contact with Patient 06/09/13 [phone]     Chief Complaint  Patient presents with  . Placement   . Foley bag leak      Level V caveat: Dementia  HPI Patient has an indwelling Foley catheter and he was brought to the emergency department because of some leaking of urine from the leg bag itself.  There's been no reported leaking urine around the catheter.  He has a history of bladder cancer status post removal of tumor with subsequent perforation intra-abdominal abscess with E coli bacteremia and and necrotizing fasciitis status post incision and debridement by [facility] in March of 2015.  He has an indwelling catheter since then.  He does not know the name of his urologist.  He has dementia and is unable to provide any additional information.  Spoke with his daughter who he lives with reports he's otherwise been doing well over the past couple weeks.  She does report that in general is difficult for her to deal with them at home and she would like placement in a nursing facility.  When I described that this was unable to be performed out emergency department she seemed to accept this and she will followup with her primary care Dr. regarding this.   Past Medical History  Diagnosis Date  . Hypertension   . Stroke     Dementia, otherwise, no residual  . Bladder mass 02-10-13    surgery planned for this  . 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 COPD (chronic obstructive pulmonary disease)     previous history and current smoking  . Dementia 02/18/2013  . 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 Transurethral resection of bladder tumor with gyrus (turbt-gyrus) N/A 02/13/2013    Procedure: TRANSURETHRAL RESECTION OF BLADDER TUMOR WITH GYRUS (TURBT-GYRUS), bladder biopsies;  Surgeon: Ardis Hughs, MD;  Location: [facility];  Service: Urology;   Laterality: N/A;  . Cystoscopy/retrograde/ureteroscopy Bilateral 02/13/2013    Procedure: BILATERAL RETROGRADE;  Surgeon: Ardis Hughs, MD;  Location: [facility];  Service: Urology;  Laterality: Bilateral;  . Laparotomy N/A 02/18/2013    Procedure: EXPLORATORY LAPAROTOMY drainage of retroperitoneal abscess, drainage of Pre-Peritoneal abscess and Exploration of bladder perforation;  Surgeon: Imogene Burn. Georgette Dover, MD;  Location: [place];  Service: General;  Laterality: N/A;   History reviewed. No pertinent family history. History  Substance Use Topics  . Smoking status: Current Every Day Smoker    Types: Cigarettes  . Smokeless tobacco: Never Used     Comment: pt's daughter states he is not smoking while in SNF  . Alcohol Use: No     Comment: not while in facility    Review of Systems  Unable to perform ROS     Allergies  Review of patient's allergies indicates no known allergies.  Home Medications   Prior to Admission medications   Medication Sig Start Date End Date Taking? Authorizing Provider  acetaminophen (TYLENOL) 325 MG tablet Take 2 tablets (650 mg total) by mouth every 6 (six) hours as needed for mild pain (or Fever >/= 101). 03/02/13   Shanker Kristeen Mans, MD  albuterol (PROVENTIL) (2.5 MG/3ML) 0.083% nebulizer solution Take 3 mLs (2.5 mg total) by nebulization every 4 (four) hours as needed for wheezing or shortness of breath. 03/11/13   Bobby Rumpf York, PA-C  collagenase (  SANTYL) ointment Apply topically daily. Apply to sacral wound daily. 03/02/13   Shanker Kristeen Mans, MD  docusate sodium (COLACE) 100 MG capsule Take 1 capsule (100 mg total) by mouth 2 (two) times daily as needed (take to keep stool soft.). 02/13/13   Ardis Hughs, MD  feeding supplement, GLUCERNA SHAKE, (GLUCERNA SHAKE) LIQD Take 237 mLs by mouth daily. 03/02/13   Shanker Kristeen Mans, MD  folic acid (FOLVITE) 1 MG tablet Take 1 tablet (1 mg total) by mouth daily. 03/02/13   Shanker Kristeen Mans, MD  iron polysaccharides  (NIFEREX) 150 MG capsule Take 1 capsule (150 mg total) by mouth daily. 03/02/13   Shanker Kristeen Mans, MD  Multiple Vitamin (MULTIVITAMIN WITH MINERALS) TABS tablet Take 1 tablet by mouth daily. 03/02/13   Shanker Kristeen Mans, MD  oxyCODONE (OXY IR/ROXICODONE) 5 MG immediate release tablet Take 1 tablet (5 mg total) by mouth every 4 (four) hours as needed for moderate pain. 03/11/13   Bobby Rumpf York, PA-C  senna-docusate (SENOKOT-S) 8.6-50 MG per tablet Take 1 tablet by mouth at bedtime. 03/11/13   Bobby Rumpf York, PA-C  thiamine 100 MG tablet Take 1 tablet (100 mg total) by mouth daily. 03/02/13   Shanker Kristeen Mans, MD   BP 139/74  Pulse 91  Temp(Src) 98.5 F (36.9 C) (Oral)  Resp 18  SpO2 97% Physical Exam  Nursing note and vitals reviewed. Constitutional: He is oriented to person, place, and time. He appears well-developed and well-nourished.  HENT:  Head: Normocephalic.  Eyes: EOM are normal.  Neck: Normal range of motion.  Pulmonary/Chest: Effort normal.  Abdominal: He exhibits no distension.  Genitourinary:  Foley catheter in place.  Small leakage of urine noted from the bottom portion of the leg bag.  Musculoskeletal: Normal range of motion.  Neurological: He is alert and oriented to person, place, and time.  Psychiatric: He has a normal mood and affect.    ED Course  Procedures (including critical care time) Labs Review Labs Reviewed - No data to display  Imaging Review No results found.   EKG Interpretation None      MDM   Final diagnoses:  Urinary catheter dysfunction    Foley catheter leg bag portion was changed out.  The indwelling Foley catheter remained and seems to be intact and doing well.  I spoke with the patient's daughter via phone.  She will pick the patient up and followup with primary care physician.  Vital signs are normal    Hoy Morn, MD 06/09/13 [phone]

## 2013-06-09 NOTE — ED Notes (Signed)
Patient's daughter will pick patient up. Phone # [phone]. Myer Peer.

## 2013-06-09 NOTE — ED Notes (Addendum)
Per EMS patient from home c/o "busted foley bag," bag appears intact with loose stopper at bottom allowing for leak. EMS tightened stopper and states that leak appears to have stopped; however, patient's daughter states patient has become increasingly incontinent and difficult to care for, wants placement for patient. Per EMS, patient is at mental baseline, oriented to self and situation.

## 2013-06-09 NOTE — ED Notes (Signed)
Bed: ZJ69 Expected date:  Expected time:  Means of arrival:  Comments: EMS 68yo M foley bag leaking

## 2014-03-22 ENCOUNTER — Other Ambulatory Visit: Payer: Self-pay | Admitting: Urology

## 2014-04-01 ENCOUNTER — Other Ambulatory Visit: Payer: Self-pay

## 2014-04-01 ENCOUNTER — Encounter (HOSPITAL_COMMUNITY): Payer: Self-pay

## 2014-04-01 ENCOUNTER — Encounter (HOSPITAL_COMMUNITY)
Admission: RE | Admit: 2014-04-01 | Discharge: 2014-04-01 | Disposition: A | Discharge status: Home / Self Care | Payer: Medicare (Managed Care) | Source: Ambulatory Visit | Attending: Urology | Admitting: Urology

## 2014-04-01 DIAGNOSIS — Z01812 Encounter for preprocedural laboratory examination: Secondary | ICD-10-CM | POA: Insufficient documentation

## 2014-04-01 DIAGNOSIS — Z01810 Encounter for preprocedural cardiovascular examination: Secondary | ICD-10-CM | POA: Insufficient documentation

## 2014-04-01 HISTORY — DX: Unspecified right bundle-branch block: I45.10

## 2014-04-01 LAB — CBC
HCT: 46.5 % (ref 39.0–52.0)
Hemoglobin: 15 g/dL (ref 13.0–17.0)
MCH: 23.4 pg — AB (ref 26.0–34.0)
MCHC: 32.3 g/dL (ref 30.0–36.0)
MCV: 72.5 fL — ABNORMAL LOW (ref 78.0–100.0)
PLATELETS: 234 10*3/uL (ref 150–400)
RBC: 6.41 MIL/uL — ABNORMAL HIGH (ref 4.22–5.81)
RDW: 15.1 % (ref 11.5–15.5)
WBC: 5.3 10*3/uL (ref 4.0–10.5)

## 2014-04-01 LAB — BASIC METABOLIC PANEL
Anion gap: 7 (ref 5–15)
BUN: 19 mg/dL (ref 6–23)
CALCIUM: 9.3 mg/dL (ref 8.4–10.5)
CO2: 25 mmol/L (ref 19–32)
CREATININE: 1.01 mg/dL (ref 0.50–1.35)
Chloride: 103 mmol/L (ref 96–112)
GFR, EST AFRICAN AMERICAN: 85 mL/min — AB (ref >90)
GFR, EST NON AFRICAN AMERICAN: 73 mL/min — AB (ref >90)
Glucose, Bld: 128 mg/dL — ABNORMAL HIGH (ref 70–99)
Potassium: 4.3 mmol/L (ref 3.5–5.1)
SODIUM: 135 mmol/L (ref 135–145)

## 2014-04-01 NOTE — Pre-Procedure Instructions (Signed)
04-01-14 EKG done today. CXR 5'15 Epic.

## 2014-04-01 NOTE — Patient Instructions (Signed)
[place]  04/01/2014   Your procedure is scheduled on:   4-6--2016 Wednesday   Enter through [facility]  Entrance and follow signs to [facility]. Arrive at     0800   AM .  Call this number if you have problems the morning of surgery: [phone]  Or Presurgical Testing [phone].   For Living Will and/or Health Care Power Attorney Forms: please provide copy for your medical record,may bring AM of surgery(Forms should be already notarized -we do not provide this service).(04-01-14 No information preferred today).  Remember: Follow any bowel prep instructions per MD office.    Do not eat food/ or drink: After Midnight.      Take these medicines the morning of surgery with A SIP OF WATER: NONE>   Do not wear jewelry, make-up or nail polish.  Do not wear deodorant, lotions, powders, or perfumes.   Do not shave legs and under arms- 48 hours(2 days) prior to first CHG shower.(Shaving face and neck okay.)  Do not bring valuables to the hospital.(Hospital is not responsible for lost valuables).  Contacts, dentures or removable bridgework, body piercing, hair pins may not be worn into surgery.  Leave suitcase in the car. After surgery it may be brought to your room.  For patients admitted to the hospital, checkout time is 11:00 AM the day of discharge.(Restricted visitors-Any Persons displaying flu-like symptoms or illness).    Patients discharged the day of surgery will not be allowed to drive home. Must have responsible person with you x 24 hours once discharged.  Name and phone number of your driver:Artice Bergerson, sister [phone] cell    Please read over the following fact sheets that you were given:  CHG(Chlorhexidine Gluconate 4% Surgical Soap) use.           [place] - Preparing for Surgery Before surgery, you can play an important role.  Because skin is not sterile, your skin needs to be as free of germs as possible.  You can reduce the number of germs  on your skin by washing with CHG (chlorahexidine gluconate) soap before surgery.  CHG is an antiseptic cleaner which kills germs and bonds with the skin to continue killing germs even after washing. Please DO NOT use if you have an allergy to CHG or antibacterial soaps.  If your skin becomes reddened/irritated stop using the CHG and inform your nurse when you arrive at Short Stay. Do not shave (including legs and underarms) for at least 48 hours prior to the first CHG shower.  You may shave your face/neck. Please follow these instructions carefully:  1.  Shower with CHG Soap the night before surgery and the  morning of Surgery.  2.  If you choose to wash your hair, wash your hair first as usual with your  normal  shampoo.  3.  After you shampoo, rinse your hair and body thoroughly to remove the  shampoo.                           4.  Use CHG as you would any other liquid soap.  You can apply chg directly  to the skin and wash                       Gently with a scrungie or clean washcloth.  5.  Apply the CHG Soap to your body ONLY FROM THE NECK DOWN.  Do not use on face/ open                           Wound or open sores. Avoid contact with eyes, ears mouth and genitals (private parts).                       Wash face,  Genitals (private parts) with your normal soap.             6.  Wash thoroughly, paying special attention to the area where your surgery  will be performed.  7.  Thoroughly rinse your body with warm water from the neck down.  8.  DO NOT shower/wash with your normal soap after using and rinsing off  the CHG Soap.                9.  Pat yourself dry with a clean towel.            10.  Wear clean pajamas.            11.  Place clean sheets on your bed the night of your first shower and do not  sleep with pets. Day of Surgery : Do not apply any lotions/deodorants the morning of surgery.  Please wear clean clothes to the hospital/surgery center.  FAILURE TO FOLLOW THESE INSTRUCTIONS  MAY RESULT IN THE CANCELLATION OF YOUR SURGERY PATIENT SIGNATURE_________________________________  NURSE SIGNATURE__________________________________  ________________________________________________________________________

## 2014-04-07 ENCOUNTER — Ambulatory Visit (HOSPITAL_COMMUNITY): Payer: Medicare (Managed Care) | Admitting: Anesthesiology

## 2014-04-07 ENCOUNTER — Encounter (HOSPITAL_COMMUNITY): Payer: Self-pay

## 2014-04-07 ENCOUNTER — Ambulatory Visit (HOSPITAL_COMMUNITY)
Admission: RE | Admit: 2014-04-07 | Discharge: 2014-04-07 | Disposition: A | Discharge status: Home / Self Care | Payer: Medicare (Managed Care) | Source: Ambulatory Visit | Attending: Urology | Admitting: Urology

## 2014-04-07 ENCOUNTER — Encounter (HOSPITAL_COMMUNITY)
Admission: RE | Disposition: A | Discharge status: Home / Self Care | Payer: Self-pay | Source: Ambulatory Visit | Attending: Urology

## 2014-04-07 DIAGNOSIS — Z8673 Personal history of transient ischemic attack (TIA), and cerebral infarction without residual deficits: Secondary | ICD-10-CM | POA: Diagnosis not present

## 2014-04-07 DIAGNOSIS — N359 Urethral stricture, unspecified: Secondary | ICD-10-CM | POA: Diagnosis not present

## 2014-04-07 DIAGNOSIS — J449 Chronic obstructive pulmonary disease, unspecified: Secondary | ICD-10-CM | POA: Insufficient documentation

## 2014-04-07 DIAGNOSIS — Z79899 Other long term (current) drug therapy: Secondary | ICD-10-CM | POA: Diagnosis not present

## 2014-04-07 DIAGNOSIS — C679 Malignant neoplasm of bladder, unspecified: Secondary | ICD-10-CM | POA: Insufficient documentation

## 2014-04-07 DIAGNOSIS — F0390 Unspecified dementia without behavioral disturbance: Secondary | ICD-10-CM | POA: Insufficient documentation

## 2014-04-07 DIAGNOSIS — I10 Essential (primary) hypertension: Secondary | ICD-10-CM | POA: Diagnosis not present

## 2014-04-07 DIAGNOSIS — F17200 Nicotine dependence, unspecified, uncomplicated: Secondary | ICD-10-CM | POA: Diagnosis not present

## 2014-04-07 DIAGNOSIS — Z7982 Long term (current) use of aspirin: Secondary | ICD-10-CM | POA: Diagnosis not present

## 2014-04-07 DIAGNOSIS — Z792 Long term (current) use of antibiotics: Secondary | ICD-10-CM | POA: Diagnosis not present

## 2014-04-07 HISTORY — PX: CYSTOSCOPY W/ RETROGRADES: SHX1426

## 2014-04-07 HISTORY — PX: BALLOON DILATION: SHX5330

## 2014-04-07 SURGERY — CYSTOSCOPY, WITH RETROGRADE PYELOGRAM
Anesthesia: General | Site: Urethra

## 2014-04-07 MED ORDER — DEXTROSE 5 % IV SOLN
5.0000 mg/kg | Freq: Once | INTRAVENOUS | Status: AC
  Administered 2014-04-07: 370 mg via INTRAVENOUS
  Filled 2014-04-07: qty 9.25

## 2014-04-07 MED ORDER — DEXAMETHASONE SODIUM PHOSPHATE 10 MG/ML IJ SOLN
INTRAMUSCULAR | Status: AC
  Filled 2014-04-07: qty 1

## 2014-04-07 MED ORDER — PHENYLEPHRINE HCL 10 MG/ML IJ SOLN
INTRAMUSCULAR | Status: DC | PRN
  Administered 2014-04-07 (×3): 80 ug via INTRAVENOUS

## 2014-04-07 MED ORDER — GLYCOPYRROLATE 0.2 MG/ML IJ SOLN
INTRAMUSCULAR | Status: AC
  Filled 2014-04-07: qty 1

## 2014-04-07 MED ORDER — BELLADONNA ALKALOIDS-OPIUM 16.2-60 MG RE SUPP
RECTAL | Status: DC | PRN
  Administered 2014-04-07: 1 via RECTAL

## 2014-04-07 MED ORDER — PROPOFOL 10 MG/ML IV BOLUS
INTRAVENOUS | Status: AC
  Filled 2014-04-07: qty 20

## 2014-04-07 MED ORDER — FENTANYL CITRATE 0.05 MG/ML IJ SOLN
25.0000 ug | INTRAMUSCULAR | Status: DC | PRN
Start: 2014-04-07 — End: 2014-04-07

## 2014-04-07 MED ORDER — CIPROFLOXACIN IN D5W 400 MG/200ML IV SOLN
INTRAVENOUS | Status: DC | PRN
  Administered 2014-04-07: 400 mg via INTRAVENOUS

## 2014-04-07 MED ORDER — ONDANSETRON HCL 4 MG/2ML IJ SOLN
INTRAMUSCULAR | Status: DC | PRN
  Administered 2014-04-07: 4 mg via INTRAVENOUS

## 2014-04-07 MED ORDER — CIPROFLOXACIN IN D5W 400 MG/200ML IV SOLN
400.0000 mg | INTRAVENOUS | Status: AC
  Administered 2014-04-07: 400 mg via INTRAVENOUS

## 2014-04-07 MED ORDER — FENTANYL CITRATE 0.05 MG/ML IJ SOLN
INTRAMUSCULAR | Status: AC
  Filled 2014-04-07: qty 5

## 2014-04-07 MED ORDER — DIATRIZOATE MEGLUMINE 30 % UR SOLN
URETHRAL | Status: DC | PRN
  Administered 2014-04-07: 14 mL via URETHRAL

## 2014-04-07 MED ORDER — FENTANYL CITRATE 0.05 MG/ML IJ SOLN
INTRAMUSCULAR | Status: DC | PRN
  Administered 2014-04-07: 75 ug via INTRAVENOUS
  Administered 2014-04-07 (×2): 25 ug via INTRAVENOUS

## 2014-04-07 MED ORDER — PHENYLEPHRINE 40 MCG/ML (10ML) SYRINGE FOR IV PUSH (FOR BLOOD PRESSURE SUPPORT)
PREFILLED_SYRINGE | INTRAVENOUS | Status: AC
  Filled 2014-04-07: qty 10

## 2014-04-07 MED ORDER — BELLADONNA ALKALOIDS-OPIUM 16.2-60 MG RE SUPP
RECTAL | Status: AC
  Filled 2014-04-07: qty 1

## 2014-04-07 MED ORDER — GLYCOPYRROLATE 0.2 MG/ML IJ SOLN
INTRAMUSCULAR | Status: DC | PRN
  Administered 2014-04-07: 0.2 mg via INTRAVENOUS

## 2014-04-07 MED ORDER — LACTATED RINGERS IV SOLN
INTRAVENOUS | Status: DC
  Administered 2014-04-07: 12:00:00 via INTRAVENOUS
  Administered 2014-04-07: 1000 mL via INTRAVENOUS

## 2014-04-07 MED ORDER — SODIUM CHLORIDE 0.9 % IR SOLN
Status: DC | PRN
  Administered 2014-04-07: 3000 mL via INTRAVESICAL

## 2014-04-07 MED ORDER — PROPOFOL 10 MG/ML IV BOLUS
INTRAVENOUS | Status: DC | PRN
  Administered 2014-04-07: 190 mg via INTRAVENOUS

## 2014-04-07 MED ORDER — PROMETHAZINE HCL 25 MG/ML IJ SOLN: 6.2500 mg | INTRAMUSCULAR | Status: DC | PRN

## 2014-04-07 MED ORDER — CIPROFLOXACIN IN D5W 400 MG/200ML IV SOLN
INTRAVENOUS | Status: AC
  Filled 2014-04-07: qty 200

## 2014-04-07 MED ORDER — ONDANSETRON HCL 4 MG/2ML IJ SOLN
INTRAMUSCULAR | Status: AC
  Filled 2014-04-07: qty 2

## 2014-04-07 MED ORDER — DEXAMETHASONE SODIUM PHOSPHATE 10 MG/ML IJ SOLN
INTRAMUSCULAR | Status: DC | PRN
  Administered 2014-04-07: 10 mg via INTRAVENOUS

## 2014-04-07 MED ORDER — SULFAMETHOXAZOLE-TRIMETHOPRIM 800-160 MG PO TABS: 1.0000 | ORAL_TABLET | Freq: Two times a day (BID) | ORAL | Status: DC

## 2014-04-07 MED ORDER — HYDROCODONE-ACETAMINOPHEN 5-325 MG PO TABS: 1.0000 | ORAL_TABLET | Freq: Four times a day (QID) | ORAL | Status: DC | PRN

## 2014-04-07 MED ORDER — MEPERIDINE HCL 50 MG/ML IJ SOLN: 6.2500 mg | INTRAMUSCULAR | Status: DC | PRN

## 2014-04-07 SURGICAL SUPPLY — 25 items
ADAPTER CATH URET PLST 4-6FR (CATHETERS) ×3 IMPLANT
ADPR CATH URET STRL DISP 4-6FR (CATHETERS) ×3
BAG URINE DRAINAGE (UROLOGICAL SUPPLIES) ×3 IMPLANT
BAG URO CATCHER STRL LF (DRAPE) ×5 IMPLANT
BALLN NEPHROSTOMY (BALLOONS) ×5
BALLOON NEPHROSTOMY (BALLOONS) ×1 IMPLANT
CATH FOLEY 2W COUNCIL 5CC 18FR (CATHETERS) ×3 IMPLANT
CATH FOLEY 2WAY SLVR  5CC 14FR (CATHETERS) ×2
CATH FOLEY 2WAY SLVR 5CC 14FR (CATHETERS) ×1 IMPLANT
CLOTH BEACON ORANGE TIMEOUT ST (SAFETY) ×5 IMPLANT
FIBER LASER FLEXIVA 1000 (UROLOGICAL SUPPLIES) IMPLANT
FIBER LASER FLEXIVA 200 (UROLOGICAL SUPPLIES) IMPLANT
FIBER LASER FLEXIVA 365 (UROLOGICAL SUPPLIES) IMPLANT
FIBER LASER FLEXIVA 550 (UROLOGICAL SUPPLIES) IMPLANT
FIBER LASER TRAC TIP (UROLOGICAL SUPPLIES) IMPLANT
GLOVE BIOGEL M STRL SZ7.5 (GLOVE) ×8 IMPLANT
GOWN STRL REUS W/TWL XL LVL3 (GOWN DISPOSABLE) ×5 IMPLANT
GUIDEWIRE ANG ZIPWIRE 038X150 (WIRE) ×3 IMPLANT
GUIDEWIRE STR DUAL SENSOR (WIRE) ×5 IMPLANT
MANIFOLD NEPTUNE II (INSTRUMENTS) ×5 IMPLANT
PACK CYSTO (CUSTOM PROCEDURE TRAY) ×5 IMPLANT
SHIELD EYE BINOCULAR (MISCELLANEOUS) IMPLANT
SYRINGE 10CC LL (SYRINGE) ×3 IMPLANT
TUBING CONNECTING 10 (TUBING) ×4 IMPLANT
TUBING CONNECTING 10' (TUBING) ×1

## 2014-04-07 NOTE — Discharge Instructions (Signed)
1. You may see some blood in the urine and may have some burning with urination for 48-72 hours. You also may notice that you have to urinate more frequently or urgently after your procedure which is normal.  °2. You should call should you develop an inability urinate, fever > 101, persistent nausea and vomiting that prevents you from eating or drinking to stay hydrated.  °

## 2014-04-07 NOTE — H&P (Signed)
Reason For Visit Follow-up for his bladder cancer   History of Present Illness 69M who presented to me in Jan 2015 with gross hematuria. He was found to have several small bladder tumors. He was taken to the OR for TURBT. This was complicated by extraperitoneal bladder perforation. A foley catheter was left, but patient accidently pulled out the catheter after 4 days. He then presented to the ED for replacement of his catheter and was found to have a necrotizing pelvic infection. He subsequently was explored/washout and recovered nicely. Foley removed 04/10/13, pelvic drain removed on 4/21.    Path 02/2013:   HG TCC invasive into lamina propria, muscularis mucosa present and uninvolved.    Imaging:  02/13/13: Bilateral retrograde pyelograms-normal, ureters and no filling defects within the renal pelvis bilaterally.  03/05/13: Cystogram-persistent left lateral wall extravasation of contrast  03/26/13: Cystogram-persistent left lateral wall extravasation of contrast, albeit smaller  04/10/13: Cystogram - no contrast extravasation. bladder appears to have healed.    Cysto:  06/02/13: no evidence of recurrence, pin-point bulbar urethral stricture requiring dilation  09/03/13: No evidence of recurrence of his bladder cancer, pinpoint bulbar stricture recurrence requiring dilation with filiforms and followers - no Foley catheter was left  12/08/13: No evidence of recurrence of his bladder cancer, pinpoint bulbar stricture recurrence requiring dilation with filiforms and followers - no Foley catheter was left    Interval: Last visit bulbar stricture was opened up and patient was scoped in clinic. No catheter was left. The patient states that he has had a really difficult time urinating since his last visit. He describes a poor stream and incomplete bladder emptying. He denies any gross hematuria or symptoms of infection. Patient's appetite has been good, his weight stable.   Past Medical  History Problems  1. History of Dementia (F03.90) 2. History of stroke (Z30.86)  Surgical History Problems  1. History of Cystoscopy With Fulguration Medium Lesion (2-5cm) 2. History of No Surgical Problems  Current Meds 1. Aspirin 81 MG Oral Tablet;  Therapy: (Recorded:02Jun2015) to Recorded 2. Ciprofloxacin HCl - 500 MG Oral Tablet; Take 1 tablet twice daily;  Therapy: [id number] to (Evaluate:18Dec2015)  Requested for: [id number]; Last  Rx:11Dec2015 Ordered 3. Colace 100 MG Oral Capsule;  Therapy: (Recorded:26Mar2015) to Recorded  Allergies Medication  1. No Known Drug Allergies  Family History Problems  1. Family history of arthritis (Z82.61) 2. Family history of malignant neoplasm (Z80.9) 3. Family history of malignant neoplasm of stomach (Z80.0) : Father  Social History Problems  1. Alcohol use 2. Caffeine use (F15.90) 3. Current every day smoker (F17.200) 4. Death in the family, father   age 69 5. Death in the family, mother 12. Divorced 5. Retired 69. Two children  Vitals Vital Signs [Data Includes: Last 1 Day]  Recorded: [id number] 01:49PM  Blood Pressure: 150 / 84 Heart Rate: 80  Procedure  Procedure: Cystoscopy   Indication: History of Urothelial Carcinoma.  Informed Consent: from the healthcare power of attorney . Specific risks including, but not limited to bleeding, infection, pain, allergic reaction etc. were explained.  Prep: The patient was prepped with betadine.  Anesthesia:. Local anesthesia was administered intraurethrally with 2% lidocaine jelly.  Antibiotic prophylaxis: Ciprofloxacin.  Procedure Note:  Urethral meatus:. No abnormalities.  Anterior urethra:. A pinpoint and severe stricture was present in the distal urethra and an attempt was made to dilate the stricture but was unsuccessful.  Bladder: The patient tolerated the procedure well.  Complications: None.    Assessment Assessed  1. Urethral stricture (N35.9) 2. Malignant  neoplasm of urinary bladder, unspecified site (C67.9)  Plan  Malignant neoplasm of urinary bladder, unspecified site  1. Follow-up Schedule Surgery Office  Follow-up  Status: Hold For - Appointment   Requested for: [id number]  URINE CULTURE; Status:In Progress - Specimen/Data Collected;  Done: [id number] Perform:[facility]; Due:16Mar2016; Marked Important; Last Updated FX:JOITGP, Santiago Glad; 03/15/2014 3:03:53 PM;Ordered; Today;  QDI:YMEBRAXEN neoplasm of urinary bladder, unspecified site; Ordered MM:[email or web address], Marland Kitchen;   Discussion/Summary I attempted to dilate the patient's urethral stricture in the office so that we could perform cystoscopy and thoroughly evaluate the patient's bladder. However, after dilating the patient to 62 [place] he was having severe pain and the dilation was becoming more difficult. As such, we have stopped there and will plan to perform a urethral dilation/direct visualization internal urethrotomy (DVIU) and cystoscope with bilateral retrograde pyelograms in the OR. We'll get this done as soon as possible. I did have to straight catheter the patient with a 10 French Foley catheter to drain his bladder and to get a urine sample. I discussed this procedure with him and his daughter in great detail. The patient understands that following the operation he will need a catheter for at least a week.    Addendum: Urine culture in office grew > 100K e.coli, sensitive to Bactrim.  He was treated with Bactrim x 7 days.  Last doese on March 25th.

## 2014-04-07 NOTE — Anesthesia Procedure Notes (Signed)
Procedure Name: LMA Insertion Date/Time: 04/07/2014 10:57 AM Performed by: Dione Booze Pre-anesthesia Checklist: Patient identified, Emergency Drugs available, Suction available and Patient being monitored Patient Re-evaluated:Patient Re-evaluated prior to inductionOxygen Delivery Method: Circle system utilized Preoxygenation: Pre-oxygenation with 100% oxygen Intubation Type: IV induction LMA: LMA inserted LMA Size: 5.0 Number of attempts: 1 Placement Confirmation: positive ETCO2 Tube secured with: Tape Dental Injury: Teeth and Oropharynx as per pre-operative assessment

## 2014-04-07 NOTE — Transfer of Care (Signed)
Immediate Anesthesia Transfer of Care Note  Patient: Jason [name]  Procedure(s) Performed: Procedure(s): BILATERAL RETROGRADE PYELOGRAM (Bilateral) BALLOON DILATION (N/A)  Patient Location: PACU  Anesthesia Type:General  Level of Consciousness: awake and patient cooperative  Airway & Oxygen Therapy: Patient Spontanous Breathing and Patient connected to face mask oxygen  Post-op Assessment: Report given to RN and Post -op Vital signs reviewed and stable  Post vital signs: Reviewed and stable  Last Vitals:  Filed Vitals:   04/07/14 0822  BP: 152/92  Pulse: 86  Temp: 36.4 C  Resp: 16    Complications: No apparent anesthesia complications

## 2014-04-07 NOTE — Op Note (Signed)
Preoperative diagnosis:  1. Bulbar urethral stricture 2.  HG T1 TCC  Postoperative diagnosis:  1. same   Procedure: 1. Retrograde urethrogram with intrepretation 2. Urethral stricture dilation using 52F nephromax balloon dilator 3. Cystoscopy, bilateral retrograde pyelograms with intrepration  Surgeon: Ardis Hughs, MD Resident surgeon: Dr. Amaryllis Dyke, M.D.  Anesthesia: General  Complications: None  Intraoperative findings: <1cm stricture at the proximal bulbar urethra that was tortuous bulbar/membranous urethra, balloon dilated to 24 F x 5 minutes.  High bladder neck and obstructing lateral lobes.  14cc of opaque contrast was used for the retrograde pyelogram in total. The ureters them showed a normal caliber without evidence of filling defect or hydronephrosis bilaterally. The renal pelvis and collecting systems were normal appearing with sharp delineated calyces bilaterally.  EBL: Minimal  Specimens: None  Indication: Jason [name] is a 69 y.o. patient with high-grade T1 bladder cancer who subsequently developed a dense bulbar urethral stricture making it difficult to perform surveillance cystoscopy in clinic. As such, we have opted to proceed with dilation of his urethral stricture, cystoscopy, and bilateral retrograde pyelograms in the operating room..  After reviewing the management options for treatment, he elected to proceed with the above surgical procedure(s). We have discussed the potential benefits and risks of the procedure, side effects of the proposed treatment, the likelihood of the patient achieving the goals of the procedure, and any potential problems that might occur during the procedure or recuperation. Informed consent has been obtained.  Description of procedure:  The patient was taken to the operating room and general anesthesia was induced.  The patient was placed in a modified dorsal lithotomy position with a bump under the left pelvis to oblique the  patient slightly, prepped and draped in the usual sterile fashion, and preoperative antibiotics were administered. A preoperative time-out was performed.   I then took a 30 [place] Foley and pass it into the patient's fossa navicularis where I pinched the urethra and performed a retrograde urethrogram with the above findings. The catheter was then removed and the patient was reprepped and draped. We then passed the 21 French 30 cystoscope through the patient's urethra to the proximal bulbar urethra where it was noted to be torturous with a pinhole stricture. A angle-tipped Glidewire was then used to navigate through the hole and into the bladder. Once into the bladder a open-ended catheter was then advanced over the angle tipped Glidewire and the Glidewire was removed exchanging of 40.38 sensor wire. We then passed a 24 [place] NephroMax balloon dilator over the wire and across the strictured segment. We then inflated the balloon to approximately 14 atm and waited for 5 minutes. The neck of the stricture opened up and the urethra was a nice uniform 24 French diameter. After 5 minutes the balloon was taken down and the dilator was removed. We then were able to advance the 21 French cystoscope through the dilated urethra and into the bladder. The prostate was difficult to navigate given its high median bar. Greater and 360 cystoscopic surveillance was then performed and there was no recurrence or bladder mucosal abnormalities noted. This point retrograde pyelogram's were performed as described above. A sensor wire was then replaced through the cystoscope and the cystoscope was then removed. An 76 French council-tipped catheter was then advanced over the wire easily and into the bladder. A B and O suppository was sewn in place in the patient's rectum. He was subsequently extubated and returned to PACU in excellent condition. There were no  peri-operative complications.  Ardis Hughs, M.D.

## 2014-04-07 NOTE — Progress Notes (Signed)
Dr. Louis Meckel in to remove foley catheter

## 2014-04-07 NOTE — Anesthesia Postprocedure Evaluation (Signed)
  Anesthesia Post-op Note  Patient: Jason [name]  Procedure(s) Performed: Procedure(s) (LRB): BILATERAL RETROGRADE PYELOGRAM (Bilateral) BALLOON DILATION (N/A)  Patient Location: PACU  Anesthesia Type: General  Level of Consciousness: awake and alert   Airway and Oxygen Therapy: Patient Spontanous Breathing  Post-op Pain: mild  Post-op Assessment: Post-op Vital signs reviewed, Patient's Cardiovascular Status Stable, Respiratory Function Stable, Patent Airway and No signs of Nausea or vomiting  Last Vitals:  Filed Vitals:   04/07/14 1406  BP: 141/71  Pulse: 72  Temp:   Resp: 18    Post-op Vital Signs: stable   Complications: No apparent anesthesia complications

## 2014-04-07 NOTE — Anesthesia Preprocedure Evaluation (Signed)
Anesthesia Evaluation  Patient identified by MRN, date of birth, ID band Patient awake    Reviewed: Allergy & Precautions, NPO status , Patient's Chart, lab work & pertinent test results  Airway Mallampati: II  TM Distance: >3 FB Neck ROM: Full    Dental no notable dental hx. (+)    Pulmonary COPDCurrent Smoker,  breath sounds clear to auscultation  Pulmonary exam normal       Cardiovascular hypertension, Pt. on medications Rhythm:Regular Rate:Normal  RBBB   Neuro/Psych Dementia  CVA, Residual Symptoms negative psych ROS   GI/Hepatic negative GI ROS, Neg liver ROS,   Endo/Other  negative endocrine ROS  Renal/GU negative Renal ROS  negative genitourinary   Musculoskeletal negative musculoskeletal ROS (+)   Abdominal   Peds negative pediatric ROS (+)  Hematology negative hematology ROS (+)   Anesthesia Other Findings   Reproductive/Obstetrics negative OB ROS                             Anesthesia Physical Anesthesia Plan  ASA: III  Anesthesia Plan: General   Post-op Pain Management:    Induction: Intravenous  Airway Management Planned: LMA and Oral ETT  Additional Equipment:   Intra-op Plan:   Post-operative Plan: Extubation in OR  Informed Consent: I have reviewed the patients History and Physical, chart, labs and discussed the procedure including the risks, benefits and alternatives for the proposed anesthesia with the patient or authorized representative who has indicated his/her understanding and acceptance.   Dental advisory given  Plan Discussed with: CRNA  Anesthesia Plan Comments:         Anesthesia Quick Evaluation

## 2014-04-07 NOTE — Progress Notes (Signed)
Patient has had a stroke. Does not comprehend,  Family is in waiting room

## 2014-04-08 ENCOUNTER — Encounter (HOSPITAL_COMMUNITY): Payer: Self-pay | Admitting: Urology

## 2015-11-21 IMAGING — US US RENAL
1 series · 14 of 25 positions shown · non-contrast
Comparison: None.

CLINICAL DATA: Hematuria

EXAM:
RENAL/URINARY TRACT ULTRASOUND COMPLETE

[Series 1: us renal · 0.31mm/px · 14 of 38 slices shown]
[im 1/38]
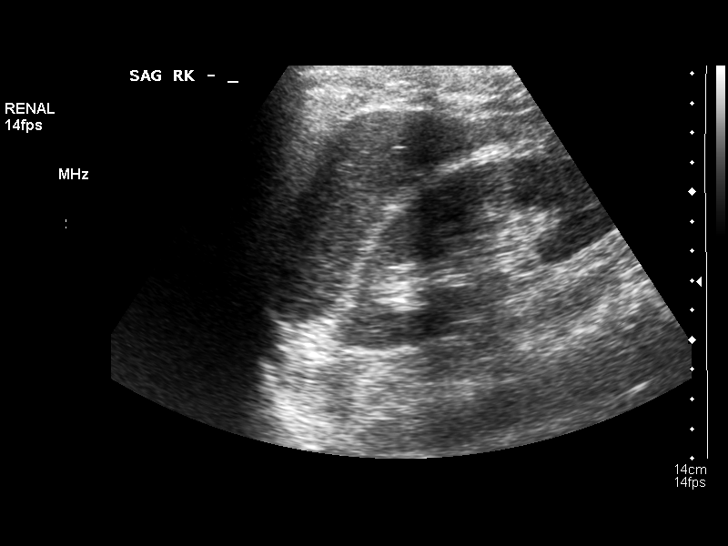
[im 4/38]
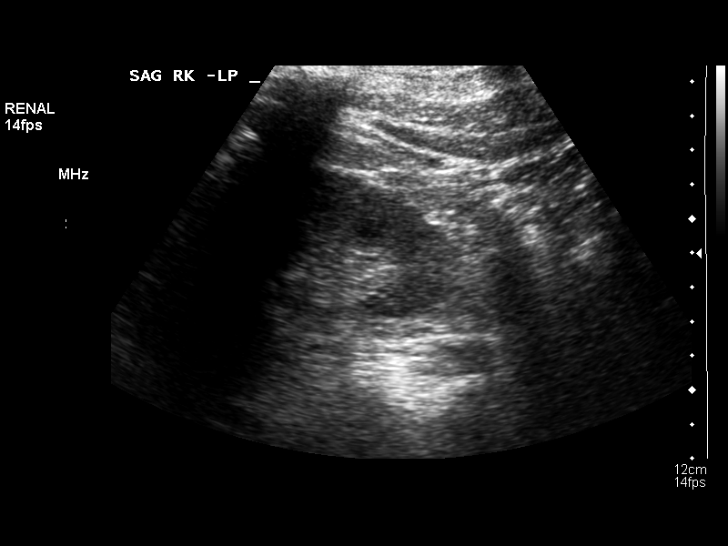
[im 7/38]
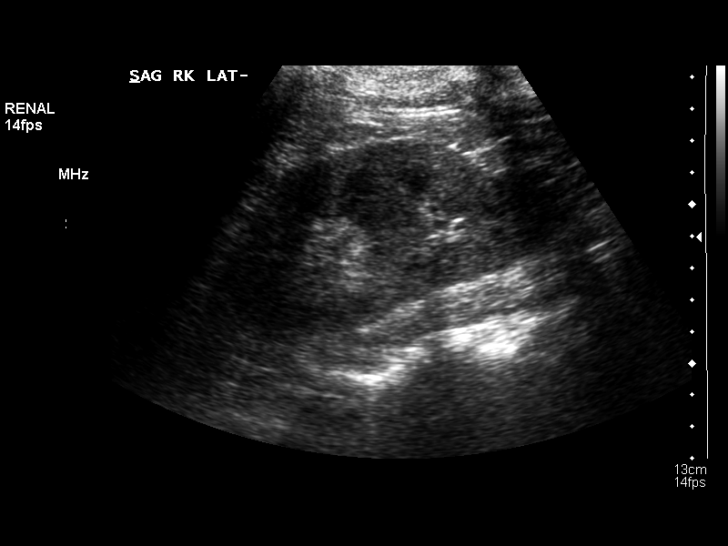
[im 10/38]
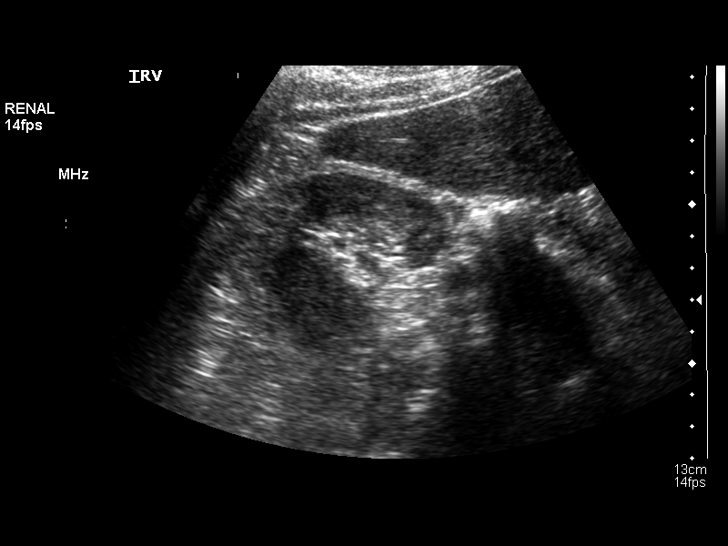
[im 13/38]
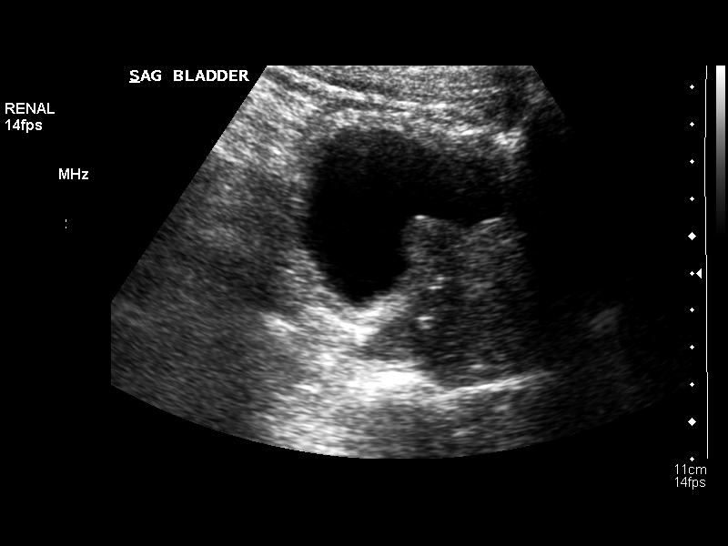
[im 14/38]
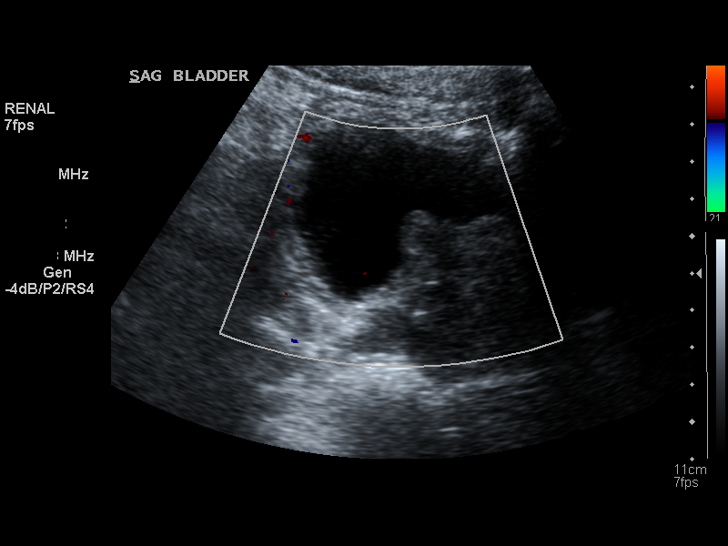
[im 17/38]
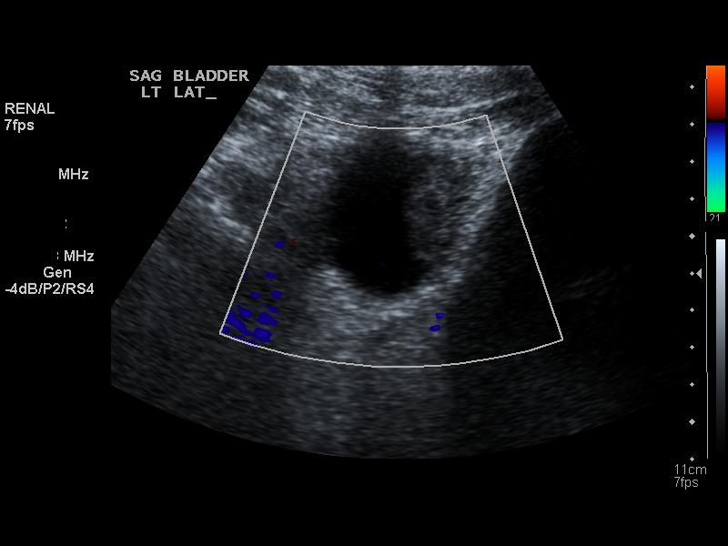
[im 21/38]
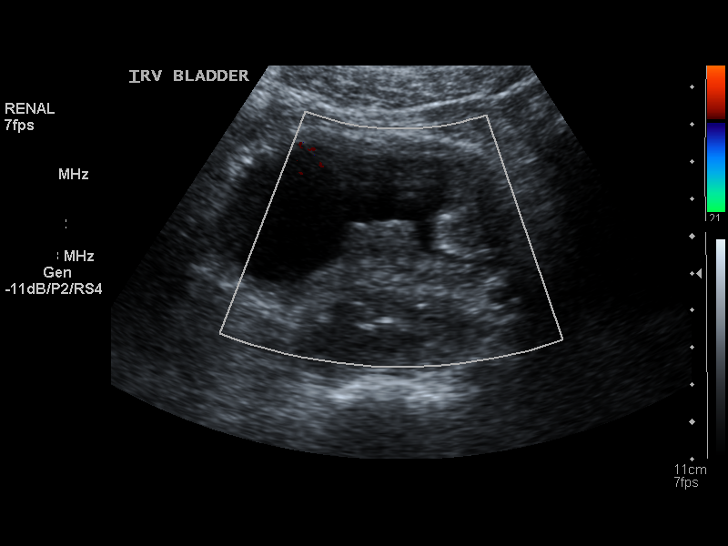
[im 24/38]
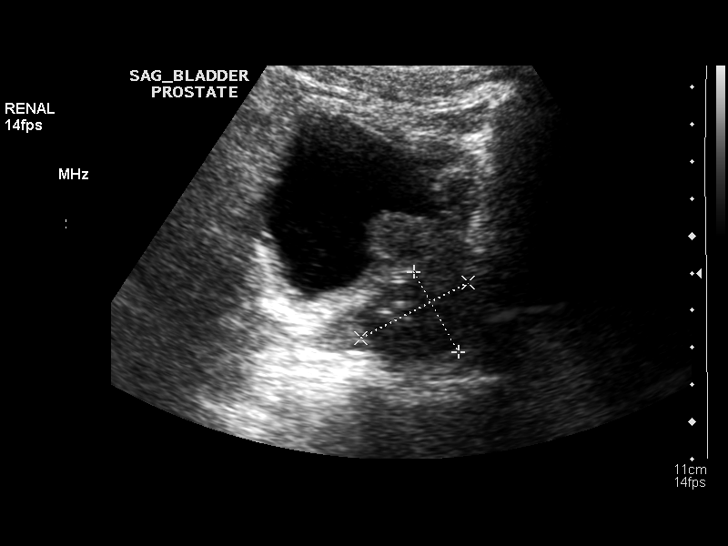
[im 25/38]
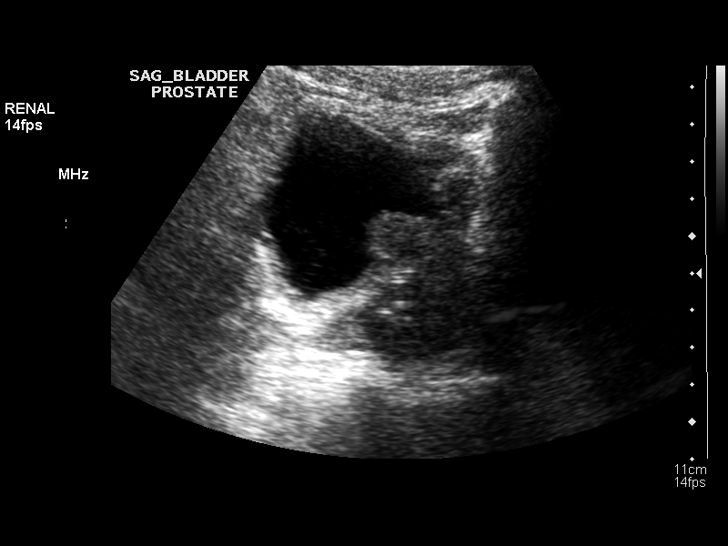
[im 28/38]
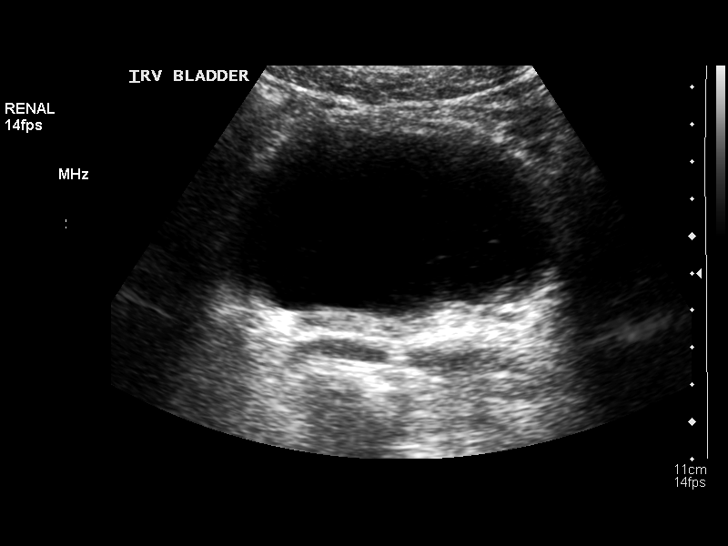
[im 31/38]
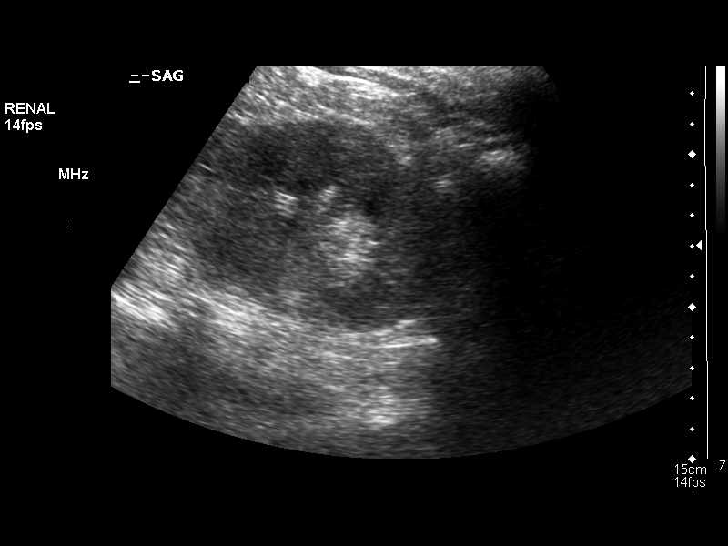
[im 34/38]
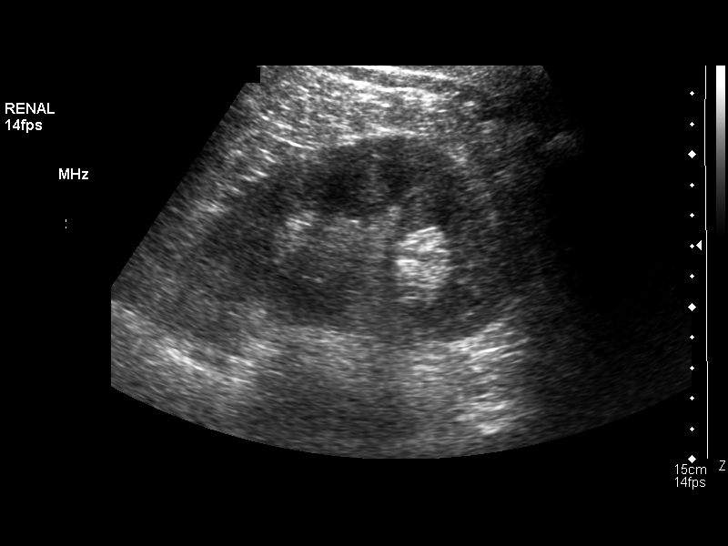
[im 38/38]
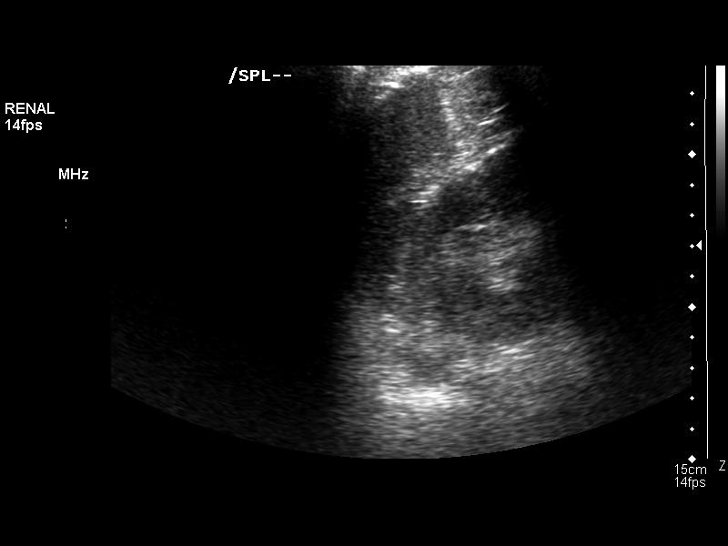

[14 of 25 positions shown; findings below may reference images not displayed]

FINDINGS: Right Kidney:

Length: 11.5 cm. Echogenicity within normal limits. No mass or
hydronephrosis visualized.

Left Kidney:

Length: 10.6 cm. Echogenicity within normal limits. No mass or
hydronephrosis visualized.

Bladder:

Lobulated bladder mass involving the base of the bladder and the
left side of the bladder. Some of the masses are echogenic and
appear pedunculated. Probable neoplasm of the bladder.
IMPRESSION: Normal kidneys.

Bladder mass consistent with bladder neoplasm.  Biopsy suggested.

## 2015-12-13 ENCOUNTER — Emergency Department (HOSPITAL_COMMUNITY): Payer: Medicare (Managed Care)

## 2015-12-13 ENCOUNTER — Inpatient Hospital Stay (HOSPITAL_COMMUNITY)
Admission: EM | Admit: 2015-12-13 | Discharge: 2015-12-15 | DRG: 871 | Disposition: A | Discharge status: Skilled Nursing Facility | Payer: Medicare (Managed Care) | Attending: Internal Medicine | Admitting: Internal Medicine

## 2015-12-13 ENCOUNTER — Encounter (HOSPITAL_COMMUNITY): Payer: Self-pay | Admitting: Emergency Medicine

## 2015-12-13 DIAGNOSIS — A419 Sepsis, unspecified organism: Principal | ICD-10-CM

## 2015-12-13 DIAGNOSIS — E049 Nontoxic goiter, unspecified: Secondary | ICD-10-CM | POA: Diagnosis present

## 2015-12-13 DIAGNOSIS — R4182 Altered mental status, unspecified: Secondary | ICD-10-CM | POA: Diagnosis present

## 2015-12-13 DIAGNOSIS — I10 Essential (primary) hypertension: Secondary | ICD-10-CM | POA: Diagnosis present

## 2015-12-13 DIAGNOSIS — B370 Candidal stomatitis: Secondary | ICD-10-CM | POA: Diagnosis present

## 2015-12-13 DIAGNOSIS — Z8673 Personal history of transient ischemic attack (TIA), and cerebral infarction without residual deficits: Secondary | ICD-10-CM | POA: Diagnosis not present

## 2015-12-13 DIAGNOSIS — J189 Pneumonia, unspecified organism: Secondary | ICD-10-CM | POA: Diagnosis not present

## 2015-12-13 DIAGNOSIS — N179 Acute kidney failure, unspecified: Secondary | ICD-10-CM | POA: Diagnosis not present

## 2015-12-13 DIAGNOSIS — N329 Bladder disorder, unspecified: Secondary | ICD-10-CM | POA: Diagnosis present

## 2015-12-13 DIAGNOSIS — Z79899 Other long term (current) drug therapy: Secondary | ICD-10-CM | POA: Diagnosis not present

## 2015-12-13 DIAGNOSIS — F17210 Nicotine dependence, cigarettes, uncomplicated: Secondary | ICD-10-CM | POA: Diagnosis present

## 2015-12-13 DIAGNOSIS — Z7982 Long term (current) use of aspirin: Secondary | ICD-10-CM | POA: Diagnosis not present

## 2015-12-13 DIAGNOSIS — F0390 Unspecified dementia without behavioral disturbance: Secondary | ICD-10-CM | POA: Diagnosis present

## 2015-12-13 DIAGNOSIS — Z7984 Long term (current) use of oral hypoglycemic drugs: Secondary | ICD-10-CM

## 2015-12-13 DIAGNOSIS — J440 Chronic obstructive pulmonary disease with acute lower respiratory infection: Secondary | ICD-10-CM | POA: Diagnosis present

## 2015-12-13 DIAGNOSIS — Z8550 Personal history of malignant neoplasm of unspecified urinary tract organ: Secondary | ICD-10-CM | POA: Diagnosis not present

## 2015-12-13 DIAGNOSIS — D509 Iron deficiency anemia, unspecified: Secondary | ICD-10-CM | POA: Diagnosis present

## 2015-12-13 LAB — COMPREHENSIVE METABOLIC PANEL
ALT: 16 U/L — ABNORMAL LOW (ref 17–63)
AST: 29 U/L (ref 15–41)
Albumin: 3.5 g/dL (ref 3.5–5.0)
Alkaline Phosphatase: 78 U/L (ref 38–126)
Anion gap: 9 (ref 5–15)
BUN: 20 mg/dL (ref 6–20)
CO2: 23 mmol/L (ref 22–32)
Calcium: 9.1 mg/dL (ref 8.9–10.3)
Chloride: 107 mmol/L (ref 101–111)
Creatinine, Ser: 1.34 mg/dL — ABNORMAL HIGH (ref 0.61–1.24)
GFR calc Af Amer: 59 mL/min — ABNORMAL LOW (ref >60)
GFR calc non Af Amer: 51 mL/min — ABNORMAL LOW (ref >60)
Glucose, Bld: 159 mg/dL — ABNORMAL HIGH (ref 65–99)
Potassium: 4.4 mmol/L (ref 3.5–5.1)
Sodium: 139 mmol/L (ref 135–145)
Total Bilirubin: 0.4 mg/dL (ref 0.3–1.2)
Total Protein: 7.5 g/dL (ref 6.5–8.1)

## 2015-12-13 LAB — URINALYSIS, ROUTINE W REFLEX MICROSCOPIC
Bilirubin Urine: NEGATIVE
Glucose, UA: NEGATIVE mg/dL
Hgb urine dipstick: NEGATIVE
Ketones, ur: NEGATIVE mg/dL
Nitrite: NEGATIVE
Protein, ur: 30 mg/dL — AB
RBC / HPF: NONE SEEN RBC/hpf (ref 0–5)
Specific Gravity, Urine: 1.017 (ref 1.005–1.030)
Squamous Epithelial / LPF: NONE SEEN
pH: 6 (ref 5.0–8.0)

## 2015-12-13 LAB — CBC WITH DIFFERENTIAL/PLATELET
Basophils Absolute: 0 10*3/uL (ref 0.0–0.1)
Basophils Relative: 0 %
Eosinophils Absolute: 0 10*3/uL (ref 0.0–0.7)
Eosinophils Relative: 0 %
HCT: 43.2 % (ref 39.0–52.0)
Hemoglobin: 14.3 g/dL (ref 13.0–17.0)
Lymphocytes Relative: 6 %
Lymphs Abs: 1.4 10*3/uL (ref 0.7–4.0)
MCH: 23.3 pg — ABNORMAL LOW (ref 26.0–34.0)
MCHC: 33.1 g/dL (ref 30.0–36.0)
MCV: 70.5 fL — ABNORMAL LOW (ref 78.0–100.0)
Monocytes Absolute: 1.2 10*3/uL — ABNORMAL HIGH (ref 0.1–1.0)
Monocytes Relative: 5 %
Neutro Abs: 21.5 10*3/uL — ABNORMAL HIGH (ref 1.7–7.7)
Neutrophils Relative %: 89 %
Platelets: 207 10*3/uL (ref 150–400)
RBC: 6.13 MIL/uL — ABNORMAL HIGH (ref 4.22–5.81)
RDW: 15.9 % — ABNORMAL HIGH (ref 11.5–15.5)
WBC: 24.1 10*3/uL — ABNORMAL HIGH (ref 4.0–10.5)

## 2015-12-13 LAB — LACTIC ACID, PLASMA: Lactic Acid, Venous: 2.3 mmol/L (ref 0.5–1.9)

## 2015-12-13 LAB — I-STAT CG4 LACTIC ACID, ED
Lactic Acid, Venous: 2.81 mmol/L (ref 0.5–1.9)
Lactic Acid, Venous: 3.48 mmol/L (ref 0.5–1.9)

## 2015-12-13 LAB — MRSA PCR SCREENING: MRSA by PCR: NEGATIVE

## 2015-12-13 LAB — CBG MONITORING, ED: Glucose-Capillary: 233 mg/dL — ABNORMAL HIGH (ref 65–99)

## 2015-12-13 MED ORDER — ASPIRIN EC 81 MG PO TBEC
81.0000 mg | DELAYED_RELEASE_TABLET | Freq: Every day | ORAL | Status: DC
  Administered 2015-12-14 – 2015-12-15 (×2): 81 mg via ORAL
  Filled 2015-12-13 (×2): qty 1

## 2015-12-13 MED ORDER — ACETAMINOPHEN 325 MG PO TABS
650.0000 mg | ORAL_TABLET | Freq: Once | ORAL | Status: AC
  Administered 2015-12-13: 650 mg via ORAL
  Filled 2015-12-13: qty 2

## 2015-12-13 MED ORDER — VANCOMYCIN HCL 10 G IV SOLR
1500.0000 mg | Freq: Once | INTRAVENOUS | Status: AC
  Administered 2015-12-13: 1500 mg via INTRAVENOUS
  Filled 2015-12-13: qty 1500

## 2015-12-13 MED ORDER — SODIUM CHLORIDE 0.9 % IV BOLUS (SEPSIS)
1000.0000 mL | Freq: Once | INTRAVENOUS | Status: AC
Start: 2015-12-13 — End: 2015-12-13
  Administered 2015-12-13: 500 mL via INTRAVENOUS

## 2015-12-13 MED ORDER — SODIUM CHLORIDE 0.9 % IV BOLUS (SEPSIS)
1000.0000 mL | Freq: Once | INTRAVENOUS | Status: DC
Start: 2015-12-13 — End: 2015-12-13

## 2015-12-13 MED ORDER — VANCOMYCIN HCL IN DEXTROSE 1-5 GM/200ML-% IV SOLN: 1000.0000 mg | Freq: Once | INTRAVENOUS | Status: DC

## 2015-12-13 MED ORDER — PIPERACILLIN-TAZOBACTAM 3.375 G IVPB 30 MIN
3.3750 g | Freq: Once | INTRAVENOUS | Status: AC
  Administered 2015-12-13: 3.375 g via INTRAVENOUS
  Filled 2015-12-13: qty 50

## 2015-12-13 MED ORDER — SODIUM CHLORIDE 0.9% FLUSH
3.0000 mL | Freq: Two times a day (BID) | INTRAVENOUS | Status: DC
  Administered 2015-12-13 – 2015-12-14 (×3): 3 mL via INTRAVENOUS

## 2015-12-13 MED ORDER — INSULIN ASPART 100 UNIT/ML ~~LOC~~ SOLN: 0.0000 [IU] | Freq: Three times a day (TID) | SUBCUTANEOUS | Status: DC

## 2015-12-13 MED ORDER — QUETIAPINE FUMARATE 50 MG PO TABS
50.0000 mg | ORAL_TABLET | Freq: Every day | ORAL | Status: DC
  Administered 2015-12-13 – 2015-12-14 (×2): 50 mg via ORAL
  Filled 2015-12-13 (×2): qty 1

## 2015-12-13 MED ORDER — DEXTROSE 5 % IV SOLN
500.0000 mg | INTRAVENOUS | Status: DC
  Administered 2015-12-14 – 2015-12-15 (×2): 500 mg via INTRAVENOUS
  Filled 2015-12-13 (×3): qty 500

## 2015-12-13 MED ORDER — DEXTROSE 5 % IV SOLN
1.0000 g | INTRAVENOUS | Status: DC
  Administered 2015-12-14 – 2015-12-15 (×2): 1 g via INTRAVENOUS
  Filled 2015-12-13 (×3): qty 10

## 2015-12-13 MED ORDER — VANCOMYCIN HCL IN DEXTROSE 750-5 MG/150ML-% IV SOLN
750.0000 mg | Freq: Two times a day (BID) | INTRAVENOUS | Status: DC
  Filled 2015-12-13: qty 150

## 2015-12-13 MED ORDER — SODIUM CHLORIDE 0.9 % IV BOLUS (SEPSIS)
250.0000 mL | Freq: Once | INTRAVENOUS | Status: AC
  Administered 2015-12-13: 250 mL via INTRAVENOUS

## 2015-12-13 MED ORDER — ACETAMINOPHEN 325 MG PO TABS: 650.0000 mg | ORAL_TABLET | Freq: Four times a day (QID) | ORAL | Status: DC | PRN

## 2015-12-13 MED ORDER — DOCUSATE SODIUM 100 MG PO CAPS: 100.0000 mg | ORAL_CAPSULE | Freq: Two times a day (BID) | ORAL | Status: DC | PRN

## 2015-12-13 MED ORDER — PIPERACILLIN-TAZOBACTAM 3.375 G IVPB
3.3750 g | Freq: Three times a day (TID) | INTRAVENOUS | Status: DC
  Filled 2015-12-13 (×2): qty 50

## 2015-12-13 MED ORDER — INSULIN ASPART 100 UNIT/ML ~~LOC~~ SOLN: 0.0000 [IU] | Freq: Every day | SUBCUTANEOUS | Status: DC

## 2015-12-13 MED ORDER — SODIUM CHLORIDE 0.9 % IV SOLN
INTRAVENOUS | Status: AC
  Administered 2015-12-13: 17:00:00 via INTRAVENOUS

## 2015-12-13 MED ORDER — NYSTATIN 100000 UNIT/ML MT SUSP
5.0000 mL | Freq: Four times a day (QID) | OROMUCOSAL | Status: DC
  Administered 2015-12-13 – 2015-12-15 (×9): 500000 [IU] via ORAL
  Filled 2015-12-13 (×8): qty 5

## 2015-12-13 MED ORDER — ONDANSETRON HCL 4 MG/2ML IJ SOLN: 4.0000 mg | INTRAMUSCULAR | Status: DC | PRN

## 2015-12-13 MED ORDER — ENOXAPARIN SODIUM 40 MG/0.4ML ~~LOC~~ SOLN
40.0000 mg | SUBCUTANEOUS | Status: DC
  Administered 2015-12-13 – 2015-12-14 (×2): 40 mg via SUBCUTANEOUS
  Filled 2015-12-13 (×2): qty 0.4

## 2015-12-13 NOTE — Progress Notes (Signed)
Admitted patient from E.D. Awake,alert and oriented x2 -person and place. Not complaining of pain.Noticed of patient's left eye which is bruised and swollen ,small skin tear on upper side eyelid.Patient's eye is somewhat teary but no drainage.

## 2015-12-13 NOTE — Progress Notes (Signed)
Patient vomited while trying to eat,took only a few bites. MD on call notified. Will continue to monitor. Jason [name], Wonda Cheng, Therapist, sports

## 2015-12-13 NOTE — ED Notes (Signed)
Attempted to give po tylenol.  Patient held in mouth.  Suctioned out.  PA made aware.

## 2015-12-13 NOTE — H&P (Signed)
Date: 12/13/2015               Patient Name:  Jason [name] MRN: [id number]  DOB: Apr 22, 1945 Age / Sex: 70 y.o., male   PCP: Janifer Adie, MD         Medical Service: Internal Medicine Teaching Service         Attending Physician: Dr. Aldine Contes, MD    First Contact: Dr. Philipp Ovens Pager: [phone]  Second Contact: Dr. Charlynn Grimes Pager: [phone]       After Hours (After 5p/  First Contact Pager: [phone]  weekends / holidays): Second Contact Pager: [phone]   Chief Complaint: Altered mental status   History of Present Illness: Mr. Renfroe is a 70yo man [facility] patient with PMHx of HTN, prior CVA, and dementia who presents today from a memory care unit with altered mental status. Per ED report, patient was less responsive and interactive today. At his baseline he is very pleasant, able to take medications without difficulty, and oriented x 2. Patient was reportedly at his baseline yesterday. Per memory care unit staff report they heard abnormal lung sounds on exam and sent him to the ED.   On our evaluation, patient was very pleasant, alert, and able to tell us he is in the hospital and is in [place]. He could not say his name or the date. He repeatedly answers "yeah" to questions and occasionally will say "I don't know." He replied "yeah" to feeling confused, having a productive cough, and feeling short of breath. He denies chest pain, diarrhea, or generalized pain. He is not able to provide much history but appeared comfortable.   In the ED, he was found to have an elevated WBC count of 24,000, febrile to 101.5, tachycardic in the 100s-120s, tachypneic in the 20s-30s, and with an elevated lactic acid of 2.81>3.48.A CXR revealed a left upper and lower lobe pneumonia with trace bilateral pleural effusions. He also had a CT head which was negative for any acute abnormalities but did show old infarcts.   Meds:  Current Meds  Medication Sig  . aspirin EC 81 MG tablet Take 81 mg by  mouth daily.  Marland Kitchen docusate sodium (COLACE) 100 MG capsule Take 1 capsule (100 mg total) by mouth 2 (two) times daily as needed (take to keep stool soft.).  Marland Kitchen metFORMIN (GLUCOPHAGE) 500 MG tablet Take 500 mg by mouth 2 (two) times daily with a meal.  . Multiple Vitamin (MULTIVITAMIN WITH MINERALS) TABS tablet Take 1 tablet by mouth daily.  . QUEtiapine (SEROQUEL) 50 MG tablet Take 50 mg by mouth at bedtime.     Allergies: Allergies as of 12/13/2015  . (No Known Allergies)   Past Medical History:  Diagnosis Date  . Bladder mass 02-10-13   surgery planned for this  . COPD (chronic obstructive pulmonary disease) (HCC)    previous history and current smoking  . Dementia 02/18/2013  . 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 Hypertension    States" never any meds for this"  . Right bundle branch block    history of this  . Stroke [facility])    Dementia, otherwise, no residual. 04-01-14 all resolved   . 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 rate (!) 22, height 5\' 8"  (1.727 m), weight 164 lb (74.4 kg), SpO2 95 %. General: elderly man sitting up in bed, pleasant, NAD HEENT: /AT, EOMI, sclera anicteric, thrush present on tongue CV: RRR, no m/g/r Pulm: Crackles heard on left side, breaths non-labored on room air Abd: BS+, soft, non-tender Ext: warm, no peripheral edema Neuro: alert and oriented to place  EKG: Sinus tachycardia. RBBB, unchanged from prior EKG  CXR: Left upper and lower lobe PNA. Trace bilateral pleural effusions.  CT Head: No acute abnormalities. Stable old right frontal and left parietal  infarcts, chronic small vessel disease, lacunar infarcts.  Assessment & Plan by Problem:  Sepsis Secondary to CAP: Patient presented with a 1 day hx of altered mental status found to be febrile, tachycardic, tachypnic, with a leukocytosis of 24,000, elevated lactic acid, and a left-sided pneumonia on CXR consistent with sepsis secondary to pneumonia. Patient initially received Vancomycin and Zosyn in the ED but was subsequently switched to Ceftriaxone and Azithromycin for CAP coverage to be started tomorrow. His UA showed small leuks and 6-30 WBCs but nitrite negative and no bacteria so his sepsis is unlikely to be related to UTI. His lactic acid trended down from 3.48 to 2.3 after IVF administration. Will continue to monitor him closely and provide IV antibiotic coverage at this time. - Start Ceftriaxone and Azithromycin tomorrow  - F/u blood cultures and urine culture - Can stop trending lactic acid as normalizing - Continue NS at 100 ml/hr - Telemetry   AKI: Cr 1.34 on admission with baseline around 1.0. His GFR had decreased to 59 from 85 about one year ago. Likely related to sepsis. - Continue IVFs as above - bmet in AM  Oral Thrush: Noted to have oral thrush on exam.  - Tx with nystatin 4 times daily   Hx CVA:  - Continue ASA  Dementia: He was oriented to place today. Per ED notes he is normally oriented x 2 but unclear if this is to person or time (most likely person). He had already significantly improved by the time we evaluated him in the ED compared to his initial presentation. Will continue to monitor for improvement as his pneumonia is treated. - Continue Seroquel   Hx HTN: BPs stable in the [id number] systolic. He is currently not on any antihypertensives per his med rec. More concerned about hypotension given he is septic.  - Continue to monitor    Diet: Heart healthy DVT PPx:  Dispo: Admit patient to Inpatient with expected length of stay greater than 2  midnights.  Signed: Juliet Rude, MD 12/13/2015, 5:50 PM  Pager: [phone]

## 2015-12-13 NOTE — Progress Notes (Addendum)
Pharmacy Antibiotic Note  Jason [name] is a 70 y.o. male admitted on 12/13/2015 with sepsis.  Pharmacy has been consulted for Vancomycin and Zosyn dosing. No temperature recorded, wbc elevated at 24.1, scr elevated at 1.34 (CrCl ~45-50 ml/min), weight 74.4 kg. No previous culture data to guide therapy, cultures pending.   Plan: Vancomycin 1500 mg IV loading dose, then 750 mg IV every 12 hours. Trough goal 15-20 mcg/ml Zosyn 3.375 g IV every 8 hours Monitor renal function and clinical improvement  Vancomycin trough as needed at steady state Follow-up culture data   Height: 5\' 8"  (172.7 cm) Weight: 164 lb (74.4 kg) IBW/kg (Calculated) : 68.4  No data recorded.   Recent Labs Lab 12/13/15 1119 12/13/15 1148  WBC 24.1*  --   CREATININE 1.34*  --   LATICACIDVEN  --  2.81*    Estimated Creatinine Clearance: 48.2 mL/min (by C-G formula based on SCr of 1.34 mg/dL (H)).    No Known Allergies  Antimicrobials this admission: Vancomycin 12/12 >>  Zosyn 12/12 >>   Dose adjustments this admission: None  Microbiology results: Pending  Thank you for allowing pharmacy to be a part of this patient's care.  Belia Heman, PharmD PGY1 Pharmacy Resident [phone] (Pager) 12/13/2015 1:30 PM

## 2015-12-13 NOTE — ED Provider Notes (Signed)
[facility] [place] DEPT Provider Note   CSN: [id number] Arrival date & time: 12/13/15  1102   History   Chief Complaint Chief Complaint  Patient presents with  . Altered Mental Status    HPI Jason [name] is a 70 y.o. male.  HPI   70 year old male presents from memory care unit. I personally spoke with the staff there, they report he is altered today. He reports yesterday he was acting normal, no signs of infectious etiology. Patient was found this morning to be less responsive, smiling and interacting, but unable to provide significant details. They originally saw no focal findings on exam, but there physical they reported decreased strength in the left arm. They note left-sided adventitious lung sounds, and reported history of pneumonia in the past. They report patient normally takes his medications without difficulty, is alert and oriented 2, and follows commands. They do report a fall 3 days ago with no significant signs of trauma.   Past Medical History:  Diagnosis Date  . Bladder mass 02-10-13   surgery planned for this  . COPD (chronic obstructive pulmonary disease) (HCC)    previous history and current smoking  . Dementia 02/18/2013  . 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 Hypertension    States" never any meds for this"  . Right bundle branch block    history of this  . Stroke [facility])    Dementia, otherwise, no residual. 04-01-14 all resolved   . 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 Acute encephalopathy 03/07/2013  . Postoperative intra-abdominal abscess ([place]) 03/07/2013  . Hydronephrosis, bilateral 03/07/2013  . Pressure ulcer, stage [id number]) 03/07/2013  . UTI (lower urinary tract infection) 02/18/2013  . Sepsis ([place]) 02/18/2013  . Aspiration pneumonia ([place]) 02/18/2013  . Acute respiratory failure with hypoxia ([place]) 02/18/2013  . Acute renal failure ([place]) 02/18/2013  . Dementia  02/18/2013  . Hematuria 02/18/2013  . 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 Memory loss 03/27/2009  . CELLULITIS, HAND, LEFT 03/23/2009  . FATIGUE 03/15/2009  . ALCOHOL ABUSE 01/30/2008  . GOITER 10/25/2006  . NICOTINE ADDICTION 10/25/2006  . HYPERTENSION 10/25/2006  . COLONIC POLYPS, HX OF 10/25/2006  . BENIGN PROSTATIC HYPERTROPHY, HX OF 10/25/2006    Past Surgical History:  Procedure Laterality Date  . BALLOON DILATION N/A 04/07/2014   Procedure: BALLOON DILATION;  Surgeon: Ardis Hughs, MD;  Location: [facility];  Service: Urology;  Laterality: N/A;  . CYSTOSCOPY W/ RETROGRADES Bilateral 04/07/2014   Procedure: BILATERAL RETROGRADE PYELOGRAM;  Surgeon: Ardis Hughs, MD;  [facility];  Service: Urology;  Laterality: Bilateral;  . CYSTOSCOPY/RETROGRADE/URETEROSCOPY Bilateral 02/13/2013   Procedure: BILATERAL RETROGRADE;  Surgeon: Ardis Hughs, MD;  [facility];  Service: Urology;  Laterality: Bilateral;  . LAPAROTOMY N/A 02/18/2013   Procedure: EXPLORATORY LAPAROTOMY drainage of retroperitoneal abscess, drainage of Pre-Peritoneal abscess and Exploration of bladder perforation;  Surgeon: Imogene Burn. Georgette Dover, MD;  Location: [facility];  Service: General;  Laterality: N/A;  . TRANSURETHRAL RESECTION OF BLADDER TUMOR WITH GYRUS (TURBT-GYRUS) N/A 02/13/2013   Procedure: TRANSURETHRAL RESECTION OF BLADDER TUMOR WITH GYRUS (TURBT-GYRUS), bladder biopsies;  Surgeon: Ardis Hughs, MD;  [facility];  Service: Urology;  Laterality: N/A;       Home Medications    Prior to Admission medications   Medication Sig Start Date End Date Taking? Authorizing Provider  aspirin EC 81 MG tablet Take 81 mg by mouth  daily.   Yes Historical Provider, MD  docusate sodium (COLACE) 100 MG capsule Take 1 capsule (100 mg total) by mouth 2 (two) times daily as needed (take to keep stool soft.). 02/13/13  Yes Ardis Hughs, MD  metFORMIN (GLUCOPHAGE) 500 MG tablet Take 500 mg by mouth  2 (two) times daily with a meal.   Yes Historical Provider, MD  Multiple Vitamin (MULTIVITAMIN WITH MINERALS) TABS tablet Take 1 tablet by mouth daily. 03/02/13  Yes Shanker Kristeen Mans, MD  QUEtiapine (SEROQUEL) 50 MG tablet Take 50 mg by mouth at bedtime.   Yes Historical Provider, MD  acetaminophen (TYLENOL) 325 MG tablet Take 2 tablets (650 mg total) by mouth every 6 (six) hours as needed for mild pain (or Fever >/= 101). 03/02/13   Shanker Kristeen Mans, MD  albuterol (PROVENTIL) (2.5 MG/3ML) 0.083% nebulizer solution Take 3 mLs (2.5 mg total) by nebulization every 4 (four) hours as needed for wheezing or shortness of breath. Patient not taking: Reported on 04/01/2014 03/11/13   Melton Alar, PA-C  collagenase (SANTYL) ointment Apply topically daily. Apply to sacral wound daily. Patient not taking: Reported on 04/01/2014 03/02/13   Jonetta Osgood, MD  feeding supplement, GLUCERNA SHAKE, (GLUCERNA SHAKE) LIQD Take 237 mLs by mouth daily. Patient not taking: Reported on 12/13/2015 03/02/13   Jonetta Osgood, MD  folic acid (FOLVITE) 1 MG tablet Take 1 tablet (1 mg total) by mouth daily. Patient not taking: Reported on 12/13/2015 03/02/13   Jonetta Osgood, MD  HYDROcodone-acetaminophen (NORCO) 5-325 MG per tablet Take 1 tablet by mouth every 6 (six) hours as needed for moderate pain. Patient not taking: Reported on 12/13/2015 04/07/14   Amaryllis Dyke, MD  iron polysaccharides (NIFEREX) 150 MG capsule Take 1 capsule (150 mg total) by mouth daily. Patient not taking: Reported on 12/13/2015 03/02/13   Jonetta Osgood, MD  oxyCODONE (OXY IR/ROXICODONE) 5 MG immediate release tablet Take 1 tablet (5 mg total) by mouth every 4 (four) hours as needed for moderate pain. Patient not taking: Reported on 12/13/2015 03/11/13   Bobby Rumpf [place], PA-C  senna-docusate (SENOKOT-S) 8.6-50 MG per tablet Take 1 tablet by mouth at bedtime. Patient not taking: Reported on 12/13/2015 03/11/13   Melton Alar, PA-C    sulfamethoxazole-trimethoprim (BACTRIM DS,SEPTRA DS) 800-160 MG per tablet Take 1 tablet by mouth 2 (two) times daily. Patient not taking: Reported on 12/13/2015 04/07/14   Amaryllis Dyke, MD  thiamine 100 MG tablet Take 1 tablet (100 mg total) by mouth daily. Patient not taking: Reported on 12/13/2015 03/02/13   Jonetta Osgood, MD    Family History No family history on file.  Social History Social History  Substance Use Topics  . Smoking status: Current Every Day Smoker    Packs/day: 1.00    Types: Cigarettes  . Smokeless tobacco: Never Used  . Alcohol use Yes     Comment: 2- 4 ounces     Allergies   Patient has no known allergies.   Review of Systems Review of Systems  All other systems reviewed and are negative.    Physical Exam Updated Vital Signs BP 124/65 (BP Location: Left Arm)   Pulse (!) 109   Temp 97.5 F (36.4 C) (Oral)   Resp (!) 22   Ht 5\' 8"  (1.727 m)   Wt 74.4 kg   SpO2 95%   BMI 24.94 kg/m   Physical Exam  Constitutional: He is oriented to person, place, and time. He  appears well-developed and well-nourished.  HENT:  Head: Normocephalic and atraumatic.  Eyes: Conjunctivae are normal. Pupils are equal, round, and reactive to light. Right eye exhibits no discharge. Left eye exhibits no discharge. No scleral icterus.  Neck: Normal range of motion. No JVD present. No tracheal deviation present.  Pulmonary/Chest: Effort normal. No stridor.  Rales left  Neurological: He is alert and oriented to person, place, and time. Coordination normal.  Psychiatric: He has a normal mood and affect. His behavior is normal. Judgment and thought content normal.  Nursing note and vitals reviewed.    ED Treatments / Results  Labs (all labs ordered are listed, but only abnormal results are displayed) Labs Reviewed  CBC WITH DIFFERENTIAL/PLATELET - Abnormal; Notable for the following:       Result Value   WBC 24.1 (*)    RBC 6.13 (*)    MCV 70.5 (*)    MCH  23.3 (*)    RDW 15.9 (*)    Neutro Abs 21.5 (*)    Monocytes Absolute 1.2 (*)    All other components within normal limits  COMPREHENSIVE METABOLIC PANEL - Abnormal; Notable for the following:    Glucose, Bld 159 (*)    Creatinine, Ser 1.34 (*)    ALT 16 (*)    GFR calc non Af Amer 51 (*)    GFR calc Af Amer 59 (*)    All other components within normal limits  URINALYSIS, ROUTINE W REFLEX MICROSCOPIC - Abnormal; Notable for the following:    APPearance HAZY (*)    Protein, ur 30 (*)    Leukocytes, UA SMALL (*)    Bacteria, UA RARE (*)    All other components within normal limits  LACTIC ACID, PLASMA - Abnormal; Notable for the following:    Lactic Acid, Venous 2.3 (*)    All other components within normal limits  CBG MONITORING, ED - Abnormal; Notable for the following:    Glucose-Capillary 233 (*)    All other components within normal limits  I-STAT CG4 LACTIC ACID, ED - Abnormal; Notable for the following:    Lactic Acid, Venous 2.81 (*)    All other components within normal limits  I-STAT CG4 LACTIC ACID, ED - Abnormal; Notable for the following:    Lactic Acid, Venous 3.48 (*)    All other components within normal limits  CULTURE, BLOOD (ROUTINE X 2)  CULTURE, BLOOD (ROUTINE X 2)  URINE CULTURE  CBC  BASIC METABOLIC PANEL  I-STAT CG4 LACTIC ACID, ED    EKG  EKG Interpretation None       Radiology Dg Chest 2 View  Result Date: 12/13/2015 CLINICAL DATA:  Cough, questionable altered mental status, limited clinical history. EXAM: CHEST  2 VIEW COMPARISON:  Chest x-ray dated May 06, 2013 FINDINGS: The lung volumes are markedly decreased as compared to the previous study. Interstitial markings are increased in the left upper and lower lobe with partial obscuration of the left hemidiaphragm. There may be small bilateral pleural effusions. The heart and pulmonary vascularity are normal. The mediastinum is normal in width. The bony thorax is unremarkable. IMPRESSION: Left  upper and lower lobe pneumonia. Trace bilateral pleural effusions. No overt pulmonary vascular congestion or pulmonary edema. Followup PA and lateral chest X-ray is recommended in 3-4 weeks following trial of antibiotic therapy to ensure resolution and exclude underlying malignancy. Electronically Signed   By: David  Martinique M.D.   On: 12/13/2015 11:57   Ct Head Wo Contrast  Result Date: 12/13/2015 CLINICAL DATA:  Drooling, left-sided facial droop and altered mental status. EXAM: CT HEAD WITHOUT CONTRAST TECHNIQUE: Contiguous axial images were obtained from the base of the skull through the vertex without intravenous contrast. COMPARISON:  03/23/2013 FINDINGS: Brain: Old right frontal and left parietal infarcts appear stable. Stable periventricular small vessel ischemic disease and old lacunar infarcts. No acute infarction, hemorrhage or mass effect identified. No evidence of hydrocephalus. Relatively stable cortical atrophy. Vascular: No hyperdense vessel or unexpected calcification. Skull: Normal. Negative for fracture or focal lesion. Sinuses/Orbits: No acute finding. Other: None. IMPRESSION: No acute findings. Stable old right frontal and left parietal infarcts as well as chronic small vessel disease, lacunar infarcts and atrophy. Electronically Signed   By: Aletta Edouard M.D.   On: 12/13/2015 12:37    Procedures Procedures (including critical care time)  CRITICAL CARE Performed by: Elmer Ramp   Total critical care time: 35 minutes  Critical care time was exclusive of separately billable procedures and treating other patients.  Critical care was necessary to treat or prevent imminent or life-threatening deterioration.  Critical care was time spent personally by me on the following activities: development of treatment plan with patient and/or surrogate as well as nursing, discussions with consultants, evaluation of patient's response to treatment, examination of patient, obtaining  history from patient or surrogate, ordering and performing treatments and interventions, ordering and review of laboratory studies, ordering and review of radiographic studies, pulse oximetry and re-evaluation of patient's condition.  Medications Ordered in ED Medications  aspirin EC tablet 81 mg (not administered)  QUEtiapine (SEROQUEL) tablet 50 mg (not administered)  docusate sodium (COLACE) capsule 100 mg (not administered)  enoxaparin (LOVENOX) injection 40 mg (not administered)  sodium chloride flush (NS) 0.9 % injection 3 mL (not administered)  insulin aspart (novoLOG) injection 0-9 Units (not administered)  insulin aspart (novoLOG) injection 0-5 Units (not administered)  cefTRIAXone (ROCEPHIN) 1 g in dextrose 5 % 50 mL IVPB (not administered)  azithromycin (ZITHROMAX) 500 mg in dextrose 5 % 250 mL IVPB (not administered)  0.9 %  sodium chloride infusion ( Intravenous New Bag/Given 12/13/15 1723)  nystatin (MYCOSTATIN) 100000 UNIT/ML suspension 500,000 Units (500,000 Units Oral Given 12/13/15 1723)  piperacillin-tazobactam (ZOSYN) IVPB 3.375 g (0 g Intravenous Stopped 12/13/15 1402)  sodium chloride 0.9 % bolus 1,000 mL (0 mLs Intravenous Stopped 12/13/15 1444)    And  sodium chloride 0.9 % bolus 250 mL (0 mLs Intravenous Stopped 12/13/15 1346)  vancomycin (VANCOCIN) 1,500 mg in sodium chloride 0.9 % 500 mL IVPB (0 mg Intravenous Stopped 12/13/15 1506)  acetaminophen (TYLENOL) tablet 650 mg (650 mg Oral Given 12/13/15 1506)     Initial Impression / Assessment and Plan / ED Course  I have reviewed the triage vital signs and the nursing notes.  Pertinent labs & imaging results that were available during my care of the patient were reviewed by me and considered in my medical decision making (see chart for details).  Clinical Course       Final Clinical Impressions(s) / ED Diagnoses   Final diagnoses:  Sepsis, due to unspecified organism [facility])  Community acquired pneumonia of  left lung, unspecified part of lung    Labs:  Imaging:  Consults:  Therapeutics:  Discharge Meds:   Assessment/Plan:   70 year old male presents today with altered mental status. Patient sepsis likely secondary to pneumonia. Patient has a history aspiration pneumonia in the past, he'll be treated with broad spectrum antibiotics. Patient has elevated lactic acid,  started on weight based fluids.      New Prescriptions Current Discharge Medication List       Okey Regal, PA-C 12/13/15 1827    Gareth Morgan, MD 12/17/15 [phone]

## 2015-12-13 NOTE — ED Notes (Signed)
Pt transported to xray 

## 2015-12-13 NOTE — ED Notes (Signed)
CBG 233; RN aware

## 2015-12-13 NOTE — ED Triage Notes (Signed)
Brought by EMS from [facility] memory care.  Staff reports they noticed drooling and left side facial droop this morning.  Reports patient was pocketing food and medications.  Able to move self to stretcher with minimal assist required.  CBG-198.

## 2015-12-13 NOTE — Progress Notes (Signed)
Patient from [facility]. CSW following for disposition and return to SNF.      Lorrine Kin, MSW, LCSW [facility] ED/60M Clinical Social Worker [phone]

## 2015-12-13 NOTE — ED Notes (Signed)
Assisted Susanna, RN with in and out cath; Jorene Guest, RN was unsuccessful; repositioned patient and patient is now resting at this time

## 2015-12-13 NOTE — ED Notes (Signed)
Merry Proud PA made aware that patient had received 1 L NS via ems pta.  Told to hold off on second liter for now.  Also made aware that we were unable to obtain urine.

## 2015-12-14 DIAGNOSIS — Z79899 Other long term (current) drug therapy: Secondary | ICD-10-CM

## 2015-12-14 DIAGNOSIS — Z7982 Long term (current) use of aspirin: Secondary | ICD-10-CM

## 2015-12-14 DIAGNOSIS — B370 Candidal stomatitis: Secondary | ICD-10-CM

## 2015-12-14 DIAGNOSIS — J189 Pneumonia, unspecified organism: Secondary | ICD-10-CM

## 2015-12-14 DIAGNOSIS — Z8679 Personal history of other diseases of the circulatory system: Secondary | ICD-10-CM

## 2015-12-14 DIAGNOSIS — N179 Acute kidney failure, unspecified: Secondary | ICD-10-CM

## 2015-12-14 DIAGNOSIS — F0390 Unspecified dementia without behavioral disturbance: Secondary | ICD-10-CM

## 2015-12-14 DIAGNOSIS — Z8673 Personal history of transient ischemic attack (TIA), and cerebral infarction without residual deficits: Secondary | ICD-10-CM

## 2015-12-14 DIAGNOSIS — A419 Sepsis, unspecified organism: Principal | ICD-10-CM

## 2015-12-14 LAB — BASIC METABOLIC PANEL
ANION GAP: 9 (ref 5–15)
BUN: 12 mg/dL (ref 6–20)
CALCIUM: 8.5 mg/dL — AB (ref 8.9–10.3)
CHLORIDE: 109 mmol/L (ref 101–111)
CO2: 20 mmol/L — AB (ref 22–32)
Creatinine, Ser: 0.98 mg/dL (ref 0.61–1.24)
GFR calc Af Amer: >60 mL/min (ref >60)
GFR calc non Af Amer: >60 mL/min (ref >60)
GLUCOSE: 167 mg/dL — AB (ref 65–99)
POTASSIUM: 3.8 mmol/L (ref 3.5–5.1)
Sodium: 138 mmol/L (ref 135–145)

## 2015-12-14 LAB — CBC
HEMATOCRIT: 40.5 % (ref 39.0–52.0)
HEMOGLOBIN: 13.3 g/dL (ref 13.0–17.0)
MCH: 22.8 pg — ABNORMAL LOW (ref 26.0–34.0)
MCHC: 32.8 g/dL (ref 30.0–36.0)
MCV: 69.5 fL — AB (ref 78.0–100.0)
Platelets: 189 10*3/uL (ref 150–400)
RBC: 5.83 MIL/uL — ABNORMAL HIGH (ref 4.22–5.81)
RDW: 15.9 % — AB (ref 11.5–15.5)
WBC: 20.7 10*3/uL — AB (ref 4.0–10.5)

## 2015-12-14 LAB — URINE CULTURE

## 2015-12-14 LAB — LACTIC ACID, PLASMA: Lactic Acid, Venous: 1.4 mmol/L (ref 0.5–1.9)

## 2015-12-14 NOTE — Evaluation (Signed)
Clinical/Bedside Swallow Evaluation Patient Details  Name: Jason [name] MRN: [id number] Date of Birth: Feb 04, 1945  Today's Date: 12/14/2015 Time: SLP Start Time (ACUTE ONLY): 1348 SLP Stop Time (ACUTE ONLY): 1402 SLP Time Calculation (min) (ACUTE ONLY): 14 min  Past Medical History:  Past Medical History:  Diagnosis Date  . Bladder mass 02-10-13   surgery planned for this  . COPD (chronic obstructive pulmonary disease) (HCC)    previous history and current smoking  . Dementia 02/18/2013  . 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 Hypertension    States" never any meds for this"  . Right bundle branch block    history of this  . Stroke [facility])    Dementia, otherwise, no residual. 04-01-14 all resolved   . 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 BALLOON DILATION N/A 04/07/2014   Procedure: BALLOON DILATION;  Surgeon: Ardis Hughs, MD;  [facility];  Service: Urology;  Laterality: N/A;  . CYSTOSCOPY W/ RETROGRADES Bilateral 04/07/2014   Procedure: BILATERAL RETROGRADE PYELOGRAM;  Surgeon: Ardis Hughs, MD;  [facility];  Service: Urology;  Laterality: Bilateral;  . CYSTOSCOPY/RETROGRADE/URETEROSCOPY Bilateral 02/13/2013   Procedure: BILATERAL RETROGRADE;  Surgeon: Ardis Hughs, MD;  [facility];  Service: Urology;  Laterality: Bilateral;  . LAPAROTOMY N/A 02/18/2013   Procedure: EXPLORATORY LAPAROTOMY drainage of retroperitoneal abscess, drainage of Pre-Peritoneal abscess and Exploration of bladder perforation;  Surgeon: Imogene Burn. Georgette Dover, MD;  Location: [facility];  Service: General;  Laterality: N/A;  . TRANSURETHRAL RESECTION OF BLADDER TUMOR WITH GYRUS (TURBT-GYRUS) N/A 02/13/2013   Procedure: TRANSURETHRAL RESECTION OF BLADDER TUMOR WITH GYRUS (TURBT-GYRUS), bladder biopsies;  Surgeon: Ardis Hughs, MD;  [facility];  Service: Urology;  Laterality: N/A;    HPI:  Jason [name] is a 70yo man PACE patient with PMHx of HTN, prior CVA, and dementia who presents today from a memory care unit with altered mental status.   Assessment / Plan / Recommendation Clinical Impression  Bedside Swallow evaluation complete. Oral motor exam remarkable for generalized weakness and missing detention.  Patient with no overt s/s of aspiration during assessment, even during 3 oz water chug challenge via straw.  Patient was observed to have difficulty masticating and transiting regular textures; as a result, he utilized pureeded boluses to clear consistency.  Patient self-fed at a rapid rate and SLP provided Min verbal cues for pacing.  Chart review revealed that patient had a MBS 02/23/13 with silent aspiration of thin liquids via straw and with meds.  Patient with no admissions for PNA since 2015; however, given PNA diagnosis upon this admission and general deconditioning recommend a more conservative diet.  Recommend Dys.3 textures and continuation of thin liqudis with no straws and full supervision for reminders for small portions at a slow pace to [name] safety.  Recommend SLP follow acutely, briefly for toleration.       Aspiration Risk  Mild aspiration risk;Moderate aspiration risk    Diet Recommendation Dysphagia 3 (Mech soft);Thin liquid   Liquid Administration via: Cup;No straw Medication Administration: Whole meds with puree Supervision: Patient able to self feed;Full supervision/cueing for compensatory strategies Compensations: Minimize environmental distractions;Slow rate;Small sips/bites;Other (Comment) (Go slow) Postural Changes: Seated upright at 90 degrees    Other  Recommendations Oral Care Recommendations: Oral care BID   Follow up Recommendations Other (comment) (TBD)      Frequency and Duration min 2x/week  1 week       Prognosis Prognosis for Safe Diet Advancement: Good Barriers to Reach Goals: Cognitive deficits      Swallow Study    General HPI: Jason [name] is a 70yo man PACE patient with PMHx of HTN, prior CVA, and dementia who presents today from a memory care unit with altered mental status. Type of Study: Bedside Swallow Evaluation Previous Swallow Assessment: MBS 02/23/13 silent aspiration of thin via straw and with medication administration  Diet Prior to this Study: Regular;Thin liquids Temperature Spikes Noted: Yes Respiratory Status: Room air History of Recent Intubation: No Behavior/Cognition: Alert;Cooperative;Pleasant mood;Requires cueing Oral Cavity Assessment: Within Functional Limits Oral Care Completed by SLP: No Oral Cavity - Dentition: Missing dentition Vision: Functional for self-feeding Self-Feeding Abilities: Able to feed self;Needs set up;Needs assist Patient Positioning: Upright in chair Baseline Vocal Quality: Normal Volitional Cough: Congested;Weak Volitional Swallow: Unable to elicit    Oral/Motor/Sensory Function Overall Oral Motor/Sensory Function: Generalized oral weakness Facial Symmetry: Within Functional Limits Facial Strength: Reduced right;Reduced left Lingual ROM: Reduced right;Reduced left Lingual Symmetry: Abnormal symmetry left Lingual Strength: Reduced   Ice Chips Ice chips: Within functional limits Presentation: Spoon   Thin Liquid Thin Liquid: Within functional limits Presentation: Cup;Straw Other Comments: no overt s/s of aspiration following 3 oz chug challenge via straw     Nectar Thick Nectar Thick Liquid: Not tested   Honey Thick Honey Thick Liquid: Not tested   Puree Puree: Within functional limits Presentation: Self Fed;Spoon   Solid   GO   Solid: Impaired Presentation: Self Fed Oral Phase Impairments: Impaired mastication;Reduced lingual movement/coordination Oral Phase Functional Implications: Prolonged oral transit;Impaired mastication;Oral residue Pharyngeal Phase Impairments: Other (comments) (none) Other Comments: pt with [place] of pureeded boluses  to tansit and clear regular textures        Gunnar Fusi, M.A., [place] 12/14/2015,2:13 PM

## 2015-12-14 NOTE — Progress Notes (Signed)
  Date: 12/14/2015  Patient name: Jason [name]  Medical record number: [id number]  Date of birth: 03-02-1945   I have seen and evaluated Kebin Philipp Ovens and discussed their care with the Residency Team. In brief, patient is a 70 year old male with past medical history of hypertension, prior CVA, and dementia who presents today with altered mental status from his nursing facility. Patient unable to provide a history and history was obtained from the chart. Per chart patient was less responsive and interactive yesterday at the nursing facility and was also noted to have abnormal lung sounds on exam and was sent to the ED as they were concerned for possible infection. At baseline patient is pleasant and able to take medications without any difficulty and is oriented 2. In ED and had a chest x-ray which showed left upper lobe and lower lobe pneumonia and he was found to have a leukocytosis of 24,000 and was febrile up to 101.5 and tachycardic to the 120s and was also noted to have an elevated lactic acid. Patient was admitted for sepsis secondary to pneumonia and was started on antibiotics  Patient states he feels well today and has no new complaints. He does not know where he is at states that he feels well but does complain of an occasional cough. He states his shortness of breath is improved. No chest pain, no palpitations, no syncope, no focal weakness, no diarrhea, no nausea or vomiting.  PMHx, Fam Hx, and/or Soc Hx : As per resident admit note  Vitals:   12/14/15 0616 12/14/15 0902  BP: (!) 143/62 114/63  Pulse: (!) 102 90  Resp: 19 20  Temp: 98.2 F (36.8 C) 99.1 F (37.3 C)   Gen.: Awake, alert, confused, NAD CVS: Regular rate and rhythm, normal heart sounds Lungs: Left basilar crackles noted Abdomen: Soft, nontender, normal active bowel sounds Extremities: No edema noted  Assessment and Plan: I have seen and evaluated the patient as outlined above. I agree with the formulated Assessment  and Plan as detailed in the residents' note, with the following changes:   1. Sepsis secondary to Pneumonia: - Patient was admitted with altered mental status and was noted to be febrile, tachycardic, tachypneic with a leukocytosis and an elevated lactic acid consistent with the diagnosis of sepsis. Chest x-ray showed left upper and lower lobe infiltrates consistent with a pneumonia. Patient also complains of a productive cough consistent with pneumonia. Patient was started on ceftriaxone and azithromycin yesterday. Leukocytosis is improving and lactic acid normalized and patient has remained afebrile today.  - Continue ceftriaxone and azithromycin for now - Follow blood cultures - Possible transition to oral antibiotics in a.m. - Repeat chest x-ray in 4-6 weeks to ensure resolution of infiltrates  2. Acute kidney injury: - Likely secondary to sepsis. Creatinine now at baseline after IV hydration. - We'll continue to monitor BMP    Aldine Contes, MD 12/13/20173:33 PM

## 2015-12-14 NOTE — Progress Notes (Signed)
   Subjective: Patient was evaluated this morning on rounds. He denies any pain. He does report having a cough but denies shortness of breath or chest pain.  Objective:  Vital signs in last 24 hours: Vitals:   12/13/15 1658 12/13/15 2100 12/14/15 0616 12/14/15 0902  BP: 124/65 (!) 144/70 (!) 143/62 114/63  Pulse: (!) 109 (!) 108 (!) 102 90  Resp: (!) 22 20 19 20   Temp: 97.5 F (36.4 C) 98.2 F (36.8 C) 98.2 F (36.8 C) 99.1 F (37.3 C)  TempSrc: Oral Oral Oral Oral  SpO2: 95% 95% 96% 93%  Weight:  163 lb 9.3 oz (74.2 kg)    Height:       Physical Exam  HENT:  No thrush noted  Cardiovascular: Normal rate and regular rhythm.  Exam reveals no gallop and no friction rub.   No murmur heard. Pulmonary/Chest:  Decreased breath sounds on the left upper posterior back Crackles noted in left lower lung base  Abdominal: Soft. He exhibits no distension. There is no tenderness.  Musculoskeletal: He exhibits no edema.    Assessment/Plan:  Active Problems:   Sepsis ([place])  Sepsis Secondary to CAP Today patient was started on Ceftriaxone and Azithromycin for CAP coverage.  Yesterday his lactic acid trended down from 3.48 to 2.3 after IVF administration. Today lactic acid continues to improve and is 1.4.  WBC has decreased from 24.1 to 20.7 and patient is afebrile.    - Continue Ceftriaxone and Azithromycin   - F/u blood cultures and urine culture - Can stop trending lactic acid  - discontinue Telemetry - CBC in the morning   AKI Resolved. Cr 1.34 on admission with baseline around 1.0. Currently creatinine is 0.98.    Oral Thrush Noted to have oral thrush on admission.  Currently does not have thrush.  - Tx with nystatin 4 times daily for today   Hx CVA:  - Continue ASA  Dementia Pleasant this morning.  Was able to tell us he did not have pain.  He mentioned he wanted to watch TV, specifically a western. Will continue to monitor for improvement as his pneumonia is  treated. - Continue Seroquel   Hx HTN BPs stable at 114/63 - Continue to monitor   Dispo: Anticipated discharge in approximately 1 day(s).   Valinda Party, DO 12/14/2015, 2:04 PM Pager: [phone]

## 2015-12-14 NOTE — Discharge Summary (Signed)
Name: Jason [name] MRN: [id number] DOB: [birth year] 70 y.o. PCP: Jason Adie, MD  Date of Admission: 12/13/2015 11:02 AM Date of Discharge: 12/15/2015 Attending Physician: Jason Contes, MD  Discharge Diagnosis: 1. Sepsis secondary to pneumonia 2.  Acute Kidney Injury    Discharge Medications:   Medication List    STOP taking these medications   oxyCODONE 5 MG immediate release tablet Commonly known as:  Oxy IR/ROXICODONE   senna-docusate 8.6-50 MG tablet Commonly known as:  Senokot-S   sulfamethoxazole-trimethoprim 800-160 MG tablet Commonly known as:  BACTRIM DS,SEPTRA DS     TAKE these medications   acetaminophen 325 MG tablet Commonly known as:  TYLENOL Take 2 tablets (650 mg total) by mouth every 6 (six) hours as needed for mild pain (or Fever >/= 101).   albuterol (2.5 MG/3ML) 0.083% nebulizer solution Commonly known as:  PROVENTIL Take 3 mLs (2.5 mg total) by nebulization every 4 (four) hours as needed for wheezing or shortness of breath.   aspirin EC 81 MG tablet Take 81 mg by mouth daily.   azithromycin 500 MG tablet Commonly known as:  ZITHROMAX Take 1 tablet (500 mg total) by mouth daily.   cefdinir 300 MG capsule Commonly known as:  OMNICEF Take 1 capsule (300 mg total) by mouth 2 (two) times daily.   collagenase ointment Commonly known as:  SANTYL Apply topically daily. Apply to sacral wound daily.   docusate sodium 100 MG capsule Commonly known as:  COLACE Take 1 capsule (100 mg total) by mouth 2 (two) times daily as needed (take to keep stool soft.).   feeding supplement (GLUCERNA SHAKE) Liqd Take 237 mLs by mouth daily.   folic acid 1 MG tablet Commonly known as:  FOLVITE Take 1 tablet (1 mg total) by mouth daily.   HYDROcodone-acetaminophen 5-325 MG tablet Commonly known as:  NORCO Take 1 tablet by mouth every 6 (six) hours as needed for moderate pain.   iron polysaccharides 150 MG capsule Commonly known as:   NIFEREX Take 1 capsule (150 mg total) by mouth daily.   metFORMIN 500 MG tablet Commonly known as:  GLUCOPHAGE Take 500 mg by mouth 2 (two) times daily with a meal.   multivitamin with minerals Tabs tablet Take 1 tablet by mouth daily.   QUEtiapine 50 MG tablet Commonly known as:  SEROQUEL Take 50 mg by mouth at bedtime.   thiamine 100 MG tablet Take 1 tablet (100 mg total) by mouth daily.       Disposition and follow-up:   Mr.Jason [name] was discharged from [facility] in stable condition.  At the hospital follow up visit please address:  1.  Pneumonia: breathing status and cough, antibiotic completion   2.  Labs / imaging needed at time of follow-up: CBC, BMET  3.  Pending labs/ test needing follow-up: Urine culture, blood culture  Follow-up Appointments:   Hospital Course by problem list: Active Problems:   Sepsis ([place])   1. Sepsis secondary to pneumonia Patient was admitted with altered mental status and was noted to be febrile, tachycardic, tachypneic with a leukocytosis and an elevated lactic acid consistent with the diagnosis of sepsis. Chest x-ray showed left upper and lower lobe infiltrates consistent with a pneumonia. Patient also complained of a productive cough consistent with pneumonia. Patient was started on Vancomycin and Zosyn in the ED and transitioned to ceftriaxone and azithromycin.  Leukocytosis and lactic acid normalized and patient remained afebrile after the initiation of fluids and antibiotics.  Recommend repeat chest x-ray in 4-6 weeks to ensure resolution of infiltrates.  Patient will be discharged with additional 4 day course of omnicef and 2 day course of azithromycin.  Speech pathology evaluated patient and recommended a dysphagia 3 diet (mech soft); thin liquid.  On day of discharge patient was pleasant and agreeable to return to [facility].  2. Acute kidney injury: Likely secondary to sepsis. On discharge creatinine returned to  baseline after IV hydration.  3.  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  Hgb is 12.  May consider outpatient work up for iron deficiency.     Discharge Vitals:   BP 119/69 (BP Location: Left Arm)   Pulse 93   Temp 98.9 F (37.2 C) (Oral)   Resp 18   Ht 5\' 8"  (1.727 m)   Wt 164 lb 7.4 oz (74.6 kg)   SpO2 100%   BMI 25.01 kg/m   Pertinent Labs, Studies, and Procedures:  CBC, BMET, Chest X-ray  Discharge Instructions: Discharge Instructions    Diet - low sodium heart healthy    Complete by:  As directed    Discharge instructions    Complete by:  As directed    Please take omnicef for 4 more days and azithromycin for 2 more days   Increase activity slowly    Complete by:  As directed       Signed: Valinda Party, DO 12/15/2015, 1:24 PM   Pager: [phone]

## 2015-12-15 DIAGNOSIS — J189 Pneumonia, unspecified organism: Secondary | ICD-10-CM

## 2015-12-15 LAB — CBC
HCT: 37.8 % — ABNORMAL LOW (ref 39.0–52.0)
HEMOGLOBIN: 12.3 g/dL — AB (ref 13.0–17.0)
MCH: 22.4 pg — AB (ref 26.0–34.0)
MCHC: 32.5 g/dL (ref 30.0–36.0)
MCV: 68.7 fL — ABNORMAL LOW (ref 78.0–100.0)
PLATELETS: 189 10*3/uL (ref 150–400)
RBC: 5.5 MIL/uL (ref 4.22–5.81)
RDW: 15.2 % (ref 11.5–15.5)
WBC: 12.8 10*3/uL — ABNORMAL HIGH (ref 4.0–10.5)

## 2015-12-15 MED ORDER — AZITHROMYCIN 500 MG PO TABS: 500.0000 mg | ORAL_TABLET | Freq: Every day | ORAL | 0 refills | Status: AC

## 2015-12-15 MED ORDER — CEFDINIR 300 MG PO CAPS: 300.0000 mg | ORAL_CAPSULE | Freq: Two times a day (BID) | ORAL | 0 refills | Status: DC

## 2015-12-15 NOTE — Clinical Social Work Note (Signed)
Patient discharging back to [facility] today via ambulance. Freda Munro, admissions director at [facility] contacted, informed and d/c paperwork transmitted to facility.  Patient's sister Stanton Kidney, and brother-in-law Carloyn Manner contacted and informed of discharge. Contact made with [facility] and message left regarding discharge.   Deanthony Maull Givens, MSW, LCSW Licensed Clinical Social Worker [place] [phone]

## 2015-12-15 NOTE — Progress Notes (Addendum)
   Subjective: Patient was evaluated this morning and appeared in a pleasant mood. He states that his breathing is stable and continues to have a productive cough with green sputum production. He was sitting up in his recliner chair eating breakfast. He is agreeable to be transferred back to [facility] as he has no further complaints today.    Objective:  Vital signs in last 24 hours: Vitals:   12/14/15 1750 12/14/15 2055 12/15/15 0433 12/15/15 1103  BP: 134/89 129/75 132/69 119/69  Pulse: 93 98 99 93  Resp: 19 20 19 18   Temp: 98 F (36.7 C) 99.3 F (37.4 C) 98.6 F (37 C) 98.9 F (37.2 C)  TempSrc: Oral Oral Oral Oral  SpO2: 98% 99% 100% 100%  Weight:  164 lb 7.4 oz (74.6 kg)    Height:       Physical Exam  Constitutional: He is well-developed, well-nourished, and in no distress.  Cardiovascular: Normal rate, regular rhythm and normal heart sounds.  Exam reveals no gallop and no friction rub.   No murmur heard. Pulmonary/Chest: Effort normal. He has no wheezes.  Bibasilar crackles, L>R  Abdominal: Soft. Bowel sounds are normal. He exhibits no distension. There is no tenderness.  Musculoskeletal: He exhibits no edema.    Assessment/Plan:  Active Problems:   Sepsis ([place])  Sepsis Secondary to CAP  WBC has decreased to 12 from 24 on admission. Patient is afebrile and stating well on room air.  Blood cultures show no growth to date.  Urine culture showed multiple species present, suggest recollection.  With excellent clinical improvement patient can be discharged today with 4 more days of omnicef and 2 additional days of azithromycin.  - Continue Ceftriaxone and Azithromycin today with switch to PO medications on discharge   - blood cultures NGTD  Oral Thrush Resolved.  Noted to have oral thrush on admission.  Currently does not have thrush.   Hx CVA:  - Continue ASA  Dementia Pleasant this morning.  Was able to tell us he did not have pain and his breathing was  stable.  He is oriented to place.  Spoke with social work yesterday as patient was clinically improving for potential transfer back to [facility] today.  Left a message this morning. - Continue Seroquel   Hx HTN BPs stable at 119/69 - Continue to monitor   Dispo: Anticipated discharge today pending bed placement.   Valinda Party, DO 12/15/2015, 11:22 AM Pager: [phone]

## 2015-12-15 NOTE — Clinical Social Work Note (Signed)
Clinical Social Work Assessment  Patient Details  Name: Jason [name] MRN: [id number] Date of Birth: 07/30/1945  Date of referral:  12/14/15               Reason for consult:  Facility Placement                Permission sought to share information with:  Family Supports Permission granted to share information::  Yes, Verbal Permission Granted  Name::     Cathlean Cower or [place]::     Relationship::  Sister and brother-in-law   Contact Information:  [phone]  Housing/Transportation Living arrangements for the past 2 months:  [place] [employer] Source of Information:  Patient, Other (Comment Required) (Chart and skilled facility admissions director) Patient Interpreter Needed:  None Criminal Activity/Legal Involvement Pertinent to Current Situation/Hospitalization:  No - Comment as needed Significant Relationships:  Adult Children, Siblings, Other Family Members Lives with:  Facility Resident Do you feel safe going back to the place where you live?  Yes Need for family participation in patient care:  Yes (Comment)  Care giving concerns:  None expressed by patient or family   Social Worker assessment / plan:  CSW talked with patient's brother-in-law, Fuller Mandril by phone and informed him of patient's discharge back to facility. He will relay information to his wife.  Employment status:  Retired Forensic scientist:  Other (Comment Required) ([facility]) PT Recommendations:    Information / Referral to community resources:  Other (Comment Required) (None needed as patient from facility and is folllowed by [facility])  Patient/Family's Response to care:  No concerns expressed regarding patient's care.  Patient/Family's Understanding of and Emotional Response to Diagnosis, Current Treatment, and Prognosis:  Not discussed.  Emotional Assessment Appearance:  Appears stated age Attitude/Demeanor/Rapport:  Other (Pleasant) Affect  (typically observed):  Pleasant, Other (Pleasantly confused, could not answer CSW's basic questions) Orientation:  Oriented to Self, Oriented to Place Alcohol / Substance use:  Other (Unknown) Psych involvement (Current and /or in the community):  No (Comment)  Discharge Needs  Concerns to be addressed:  Discharge Planning Concerns Readmission within the last 30 days:  No Current discharge risk:  None Barriers to Discharge:  No Barriers Identified   Sable Feil, LCSW 12/15/2015, 3:53 PM

## 2015-12-15 NOTE — Progress Notes (Signed)
Internal Medicine Attending:   I saw and examined the patient. I reviewed the resident's note and I agree with the resident's findings and plan as documented in the resident's note.  Patient feels well today with no new complaints. He does state that he has persistent cough with greenish phlegm. His mental status is much improved compared to admission. Patient was initially admitted with altered mental status and found to be septic with leukocytosis, tachycardia, tachypnea and a chest x-ray which was suggestive for a left upper and lower lobe pneumonia. Patient appears to be back to his baseline. Leukocytosis is rapidly resolving. The vital signs are stable. Blood cultures with no growth to date. We will continue ceftriaxone and azithromycin today and transition him to by mouth antibiotics starting tomorrow. Patient is stable for discharge back to [facility] today.

## 2015-12-15 NOTE — Progress Notes (Signed)
Called  And gave report to [employer] at [facility].   Paulla Fore, RN

## 2015-12-15 NOTE — Progress Notes (Signed)
Patient discharged to [facility] transported by [facility].Family in the room and aware of discharge.Belongings sent with patient. Julietta Batterman, Wonda Cheng, Therapist, sports

## 2015-12-15 NOTE — NC FL2 (Signed)
[place] MEDICAID FL2 LEVEL OF CARE SCREENING TOOL     IDENTIFICATION  Patient Name: Jason [name] Birthdate: 03/17/1945 Sex: male Admission Date (Current Location): 12/13/2015  [place] and [place] Number:  Kathleen Argue  ([facility] [phone]) Facility and Address:  [facility] [place] [address] [place], [place] [address]      Provider Number: [id number]  Attending Physician Name and Address:  Aldine Contes, MD  Relative Name and Phone Number:  Cathlean Cower Sister; [phone];  [place],Roy - Other; [phone]     Current Level of Care: Hospital Recommended Level of Care: [place] Prior Approval Number:    Date Approved/Denied:   PASRR Number:    Discharge Plan: SNF    Current Diagnoses: Patient Active Problem List   Diagnosis Date Noted  . Acute encephalopathy 03/07/2013  . Postoperative intra-abdominal abscess ([place]) 03/07/2013  . Hydronephrosis, bilateral 03/07/2013  . Pressure ulcer, stage [id number]) 03/07/2013  . UTI (lower urinary tract infection) 02/18/2013  . Sepsis ([place]) 02/18/2013  . Aspiration pneumonia ([place]) 02/18/2013  . Acute respiratory failure with hypoxia ([place]) 02/18/2013  . Acute renal failure ([place]) 02/18/2013  . Dementia 02/18/2013  . Hematuria 02/18/2013  . 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 Memory loss 03/27/2009  . CELLULITIS, HAND, LEFT 03/23/2009  . FATIGUE 03/15/2009  . ALCOHOL ABUSE 01/30/2008  . GOITER 10/25/2006  . NICOTINE ADDICTION 10/25/2006  . HYPERTENSION 10/25/2006  . COLONIC POLYPS, HX OF 10/25/2006  . BENIGN PROSTATIC HYPERTROPHY, HX OF 10/25/2006    Orientation RESPIRATION BLADDER Height & Weight     Place, Self  Normal Incontinent Weight: 164 lb 7.4 oz (74.6 kg) Height:  5\' 8"  (172.7 cm)  BEHAVIORAL SYMPTOMS/MOOD NEUROLOGICAL BOWEL NUTRITION STATUS      Incontinent Diet (Low sodium - heart healthy)  AMBULATORY STATUS COMMUNICATION OF NEEDS Skin   Independent  Verbally Normal                       Personal Care Assistance Level of Assistance  Bathing, Feeding, Dressing Bathing Assistance: Limited assistance Feeding assistance: Independent Dressing Assistance: Limited assistance     Functional Limitations Info  Sight, Hearing, Speech Sight Info: Adequate Hearing Info: Adequate Speech Info: Adequate    SPECIAL CARE FACTORS FREQUENCY  Speech therapy             Speech Therapy Frequency: Evaluated 12/13 and DYS 3 diet recommended      Contractures Contractures Info: Not present    Additional Factors Info  Code Status, Allergies Code Status Info: FULL Allergies Info: No known allergies           Current Medications (12/15/2015):  This is the current hospital active medication list Current Facility-Administered Medications  Medication Dose Route Frequency Provider Last Rate Last Dose  . acetaminophen (TYLENOL) tablet 650 mg  650 mg Oral Q6H PRN Juliet Rude, MD      . aspirin EC tablet 81 mg  81 mg Oral Daily Carly J Rivet, MD   81 mg at 12/15/15 1115  . azithromycin (ZITHROMAX) 500 mg in dextrose 5 % 250 mL IVPB  500 mg Intravenous Q24H Juliet Rude, MD   500 mg at 12/15/15 [id number]  . cefTRIAXone (ROCEPHIN) 1 g in dextrose 5 % 50 mL IVPB  1 g Intravenous Q24H Juliet Rude, MD   1 g at 12/15/15 0833  . docusate sodium (COLACE) capsule 100 mg  100 mg Oral BID PRN Carly  Montey Hora, MD      . enoxaparin (LOVENOX) injection 40 mg  40 mg Subcutaneous Q24H Juliet Rude, MD   40 mg at 12/14/15 2139  . nystatin (MYCOSTATIN) 100000 UNIT/ML suspension 500,000 Units  5 mL Oral QID Juliet Rude, MD   500,000 Units at 12/15/15 1341  . ondansetron (ZOFRAN) injection 4 mg  4 mg Intravenous PRN Ledell Noss, MD      . QUEtiapine (SEROQUEL) tablet 50 mg  50 mg Oral QHS Juliet Rude, MD   50 mg at 12/14/15 2139  . sodium chloride flush (NS) 0.9 % injection 3 mL  3 mL Intravenous Q12H Juliet Rude, MD   3 mL at 12/14/15 2140     Discharge  Medications: Please see discharge summary for a list of discharge medications.  Relevant Imaging Results:  Relevant Lab Results:   Additional Information ss#[id number].  Sable Feil, LCSW

## 2015-12-15 NOTE — Progress Notes (Signed)
Jason [name] to be D/C'd Skilled nursing facility per MD order.  Discussed prescriptions and follow up appointments with the patient and called report to  [facility] \, Rn.  Prescriptions were faxed over to facility.   Allergies as of 12/15/2015   No Known Allergies     Medication List    STOP taking these medications   oxyCODONE 5 MG immediate release tablet Commonly known as:  Oxy IR/ROXICODONE   senna-docusate 8.6-50 MG tablet Commonly known as:  Senokot-S   sulfamethoxazole-trimethoprim 800-160 MG tablet Commonly known as:  BACTRIM DS,SEPTRA DS     TAKE these medications   acetaminophen 325 MG tablet Commonly known as:  TYLENOL Take 2 tablets (650 mg total) by mouth every 6 (six) hours as needed for mild pain (or Fever >/= 101).   albuterol (2.5 MG/3ML) 0.083% nebulizer solution Commonly known as:  PROVENTIL Take 3 mLs (2.5 mg total) by nebulization every 4 (four) hours as needed for wheezing or shortness of breath.   aspirin EC 81 MG tablet Take 81 mg by mouth daily.   azithromycin 500 MG tablet Commonly known as:  ZITHROMAX Take 1 tablet (500 mg total) by mouth daily.   cefdinir 300 MG capsule Commonly known as:  OMNICEF Take 1 capsule (300 mg total) by mouth 2 (two) times daily.   collagenase ointment Commonly known as:  SANTYL Apply topically daily. Apply to sacral wound daily.   docusate sodium 100 MG capsule Commonly known as:  COLACE Take 1 capsule (100 mg total) by mouth 2 (two) times daily as needed (take to keep stool soft.).   feeding supplement (GLUCERNA SHAKE) Liqd Take 237 mLs by mouth daily.   folic acid 1 MG tablet Commonly known as:  FOLVITE Take 1 tablet (1 mg total) by mouth daily.   HYDROcodone-acetaminophen 5-325 MG tablet Commonly known as:  NORCO Take 1 tablet by mouth every 6 (six) hours as needed for moderate pain.   iron polysaccharides 150 MG capsule Commonly known as:  NIFEREX Take 1 capsule (150 mg total) by mouth  daily.   metFORMIN 500 MG tablet Commonly known as:  GLUCOPHAGE Take 500 mg by mouth 2 (two) times daily with a meal.   multivitamin with minerals Tabs tablet Take 1 tablet by mouth daily.   QUEtiapine 50 MG tablet Commonly known as:  SEROQUEL Take 50 mg by mouth at bedtime.   thiamine 100 MG tablet Take 1 tablet (100 mg total) by mouth daily.       Vitals:   12/15/15 1103 12/15/15 1750  BP: 119/69 126/69  Pulse: 93 91  Resp: 18 18  Temp: 98.9 F (37.2 C) 98.6 F (37 C)    Skin clean, dry and intact without evidence of skin break down, no evidence of skin tears noted. IV catheter discontinued intact. Site without signs and symptoms of complications. Dressing and pressure applied. Pt denies pain at this time. No complaints noted.  An After Visit Summary was faxed to facility.  Patient escorted via [facility], and D/C SNIF via [place], RN [facility] Phone [phone]

## 2015-12-15 NOTE — Progress Notes (Signed)
Speech Language Pathology Treatment: Dysphagia  Patient Details Name: HEWLETT BARRUETA MRN: [id number] DOB: 24-Sep-1945 Today's Date: 12/15/2015 Time: [id number] SLP Time Calculation (min) (ACUTE ONLY): 23 min  Assessment / Plan / Recommendation Clinical Impression  Skilled treatment session focused on dysphagia goals. SLP facilitated session by providing skilled observation of dysphagia 3 with thin liquid lunch tray. Pt required Mod I reminders for use of compensatory swallow strategies. Pt able to consume without increased rate and bolus size. Recommend intermittent nursing supervision for use of swallow strategies and continuation of dysphagia 3 diet d/t missing teeth. Education provided to nursing and she was agreeable.    HPI HPI: Mr. Jeschke is a 70yo man [facility] patient with PMHx of HTN, prior CVA, and dementia who presents today from a memory care unit with altered mental status.      SLP Plan  Continue with current plan of care     Recommendations  Diet recommendations: Dysphagia 3 (mechanical soft);Thin liquid Liquids provided via: Cup Medication Administration: Whole meds with puree Supervision: Intermittent supervision to cue for compensatory strategies;Patient able to self feed Compensations: Minimize environmental distractions;Slow rate;Small sips/bites;Other (Comment) Postural Changes and/or Swallow Maneuvers: Seated upright 90 degrees                Oral Care Recommendations: Oral care BID Follow up Recommendations:  (TBD) Plan: Continue with current plan of care       GO              Ty Oshima B. Rutherford Nail, M.S., CCC-SLP Speech-Language Pathologist   Ved Martos 12/15/2015, 1:14 PM

## 2015-12-18 LAB — CULTURE, BLOOD (ROUTINE X 2)
Culture: NO GROWTH
Culture: NO GROWTH

## 2016-01-13 IMAGING — CT CT ABD-PELV W/ CM
2 of 5 series · 16 of 46 positions shown, 18 images · IV contrast (CONTRAST)
Comparison: 02/18/2013

CLINICAL DATA: Fever and altered mental status. Recent necrotizing
fasciitis. Sacral decubitus ulcer.

EXAM:
CT ABDOMEN AND PELVIS WITH CONTRAST
TECHNIQUE: Multidetector CT imaging of the abdomen and pelvis was performed
using the standard protocol following bolus administration of
intravenous contrast.
CONTRAST:  100mL OMNIPAQUE IOHEXOL 300 MG/ML  SOLN

[Series 2: routine · axial · 0.71mm/px · z∈[-884,-459]mm · 13 of 97 slices shown, 15 images]
[im 6/97  soft-tissue]
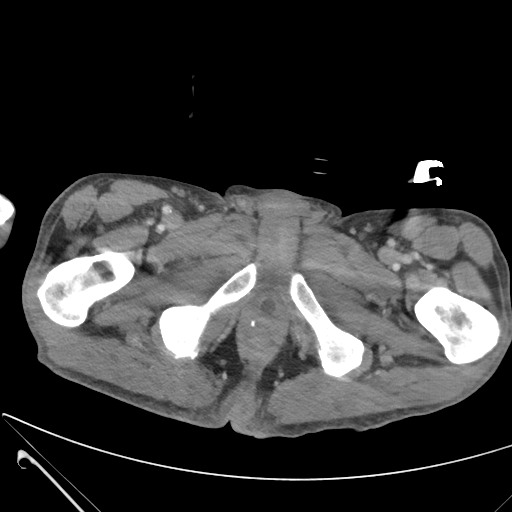
[im 6/97  bone]
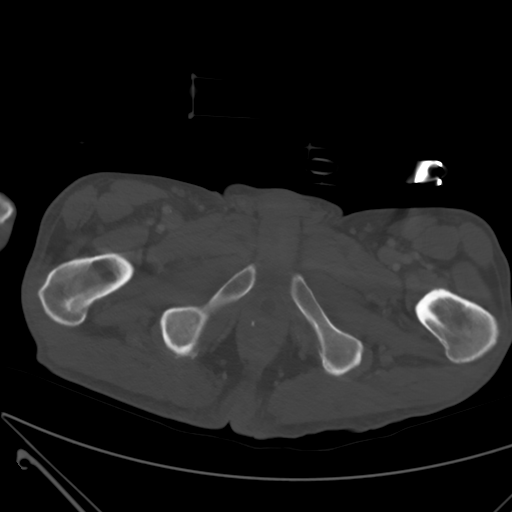
[im 11/97  soft-tissue]
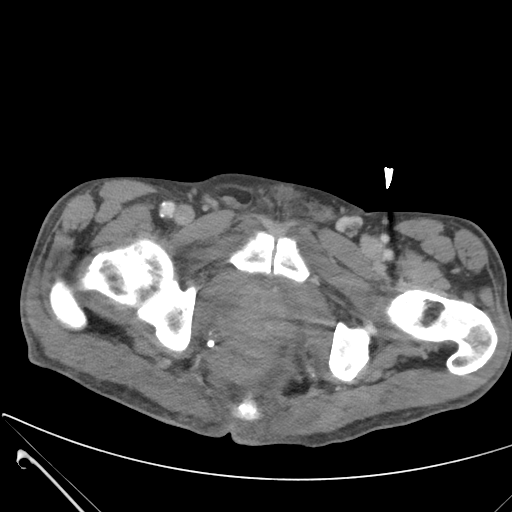
[im 22/97  soft-tissue]
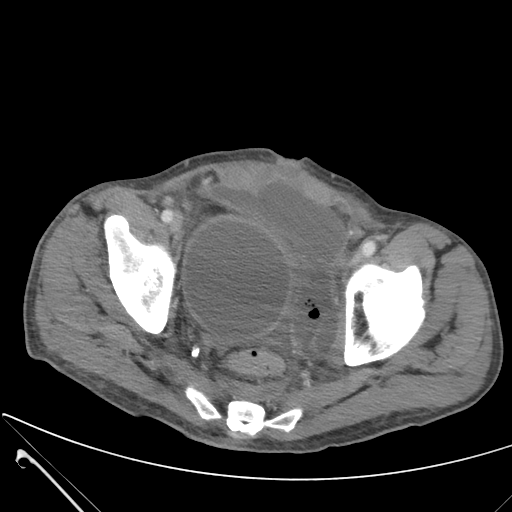
[im 27/97  soft-tissue]
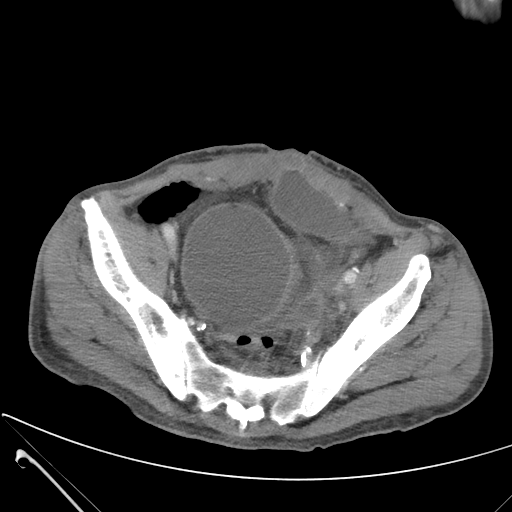
[im 33/97  soft-tissue]
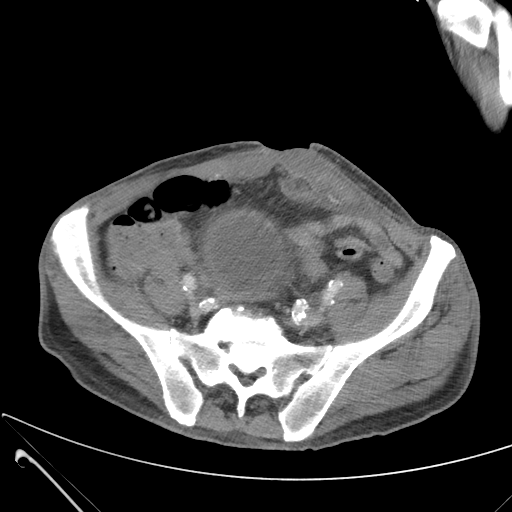
[im 43/97  soft-tissue]
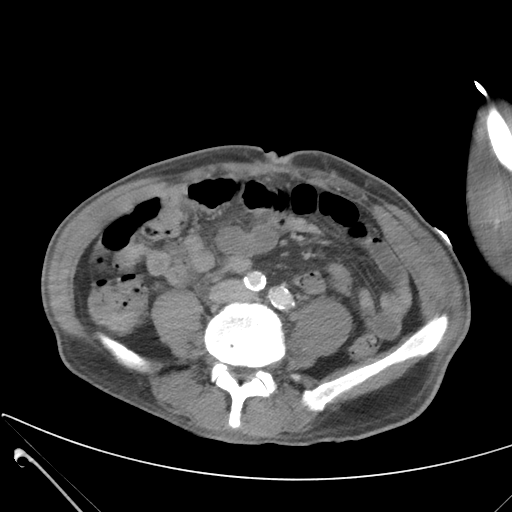
[im 49/97  soft-tissue]
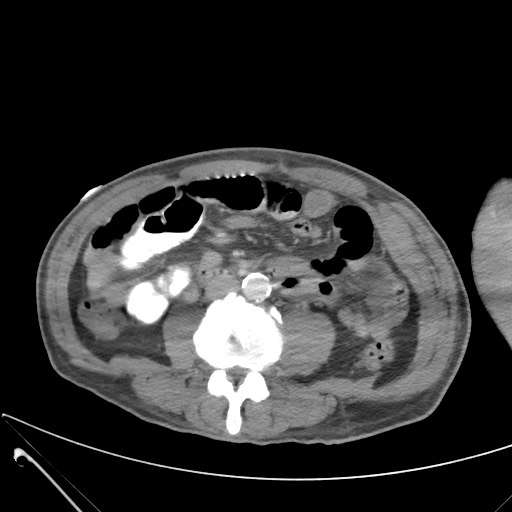
[im 54/97  soft-tissue]
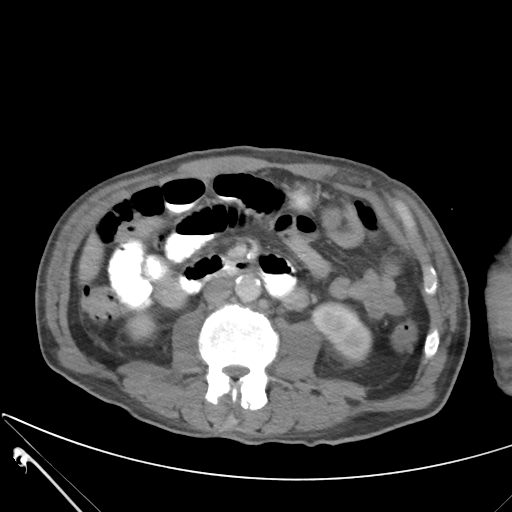
[im 65/97  soft-tissue]
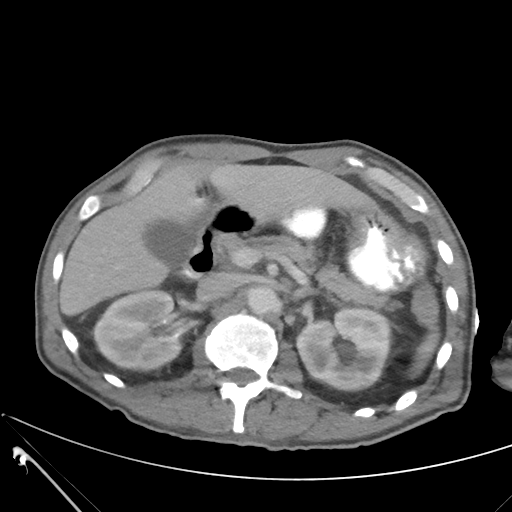
[im 65/97  bone]
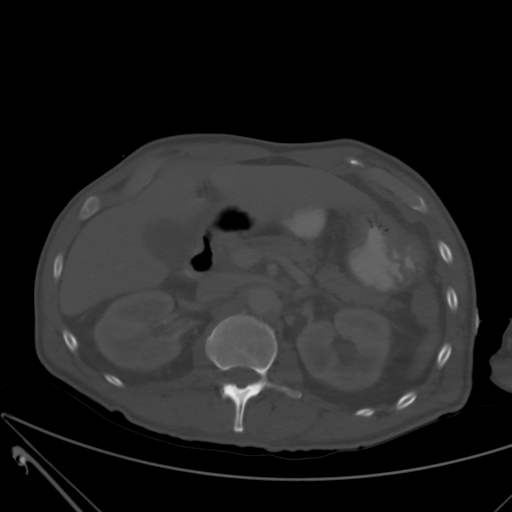
[im 70/97  soft-tissue]
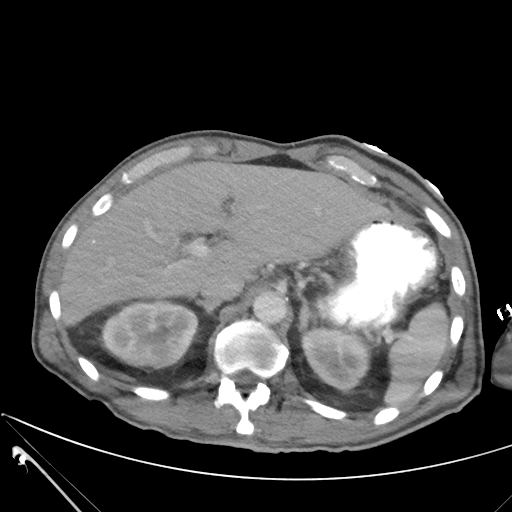
[im 75/97  soft-tissue]
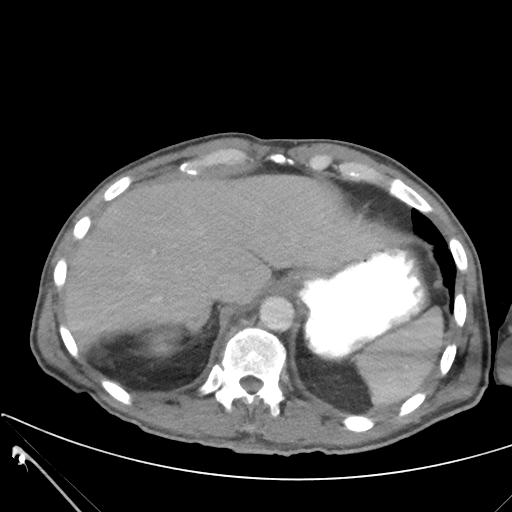
[im 86/97  soft-tissue]
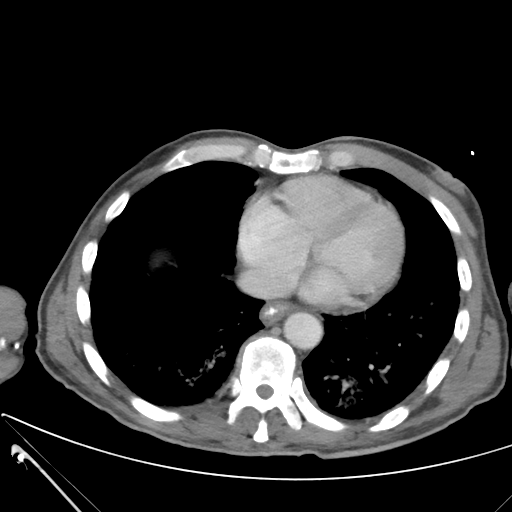
[im 91/97  soft-tissue]
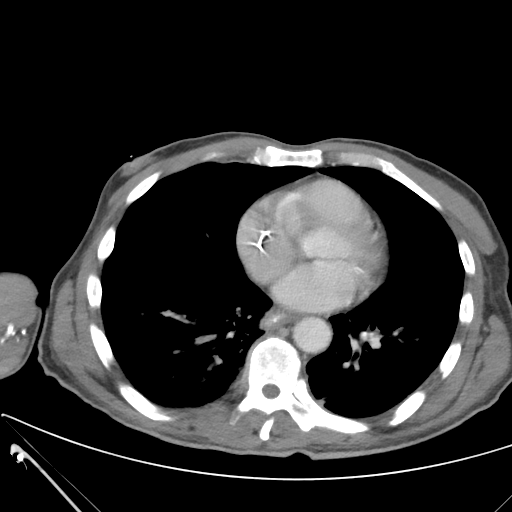

[coronals · coronal · 0.94mm/px · 3 of 86 slices shown]
[im 29/86  soft-tissue]
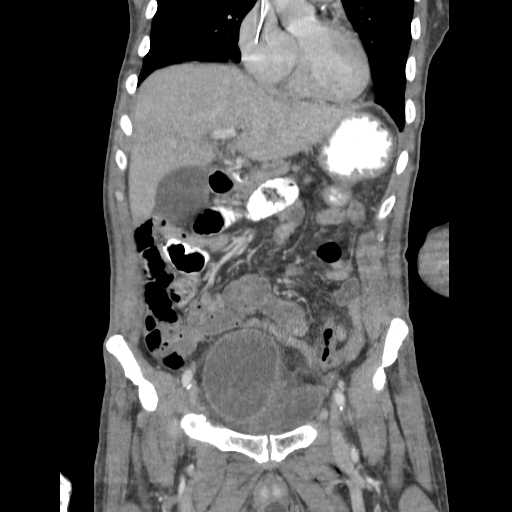
[im 38/86  soft-tissue]
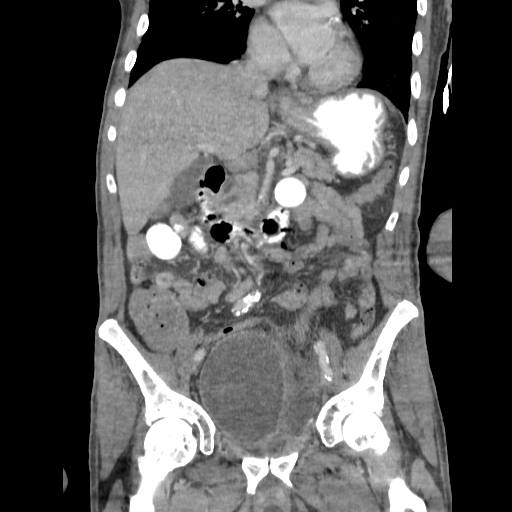
[im 48/86  soft-tissue]
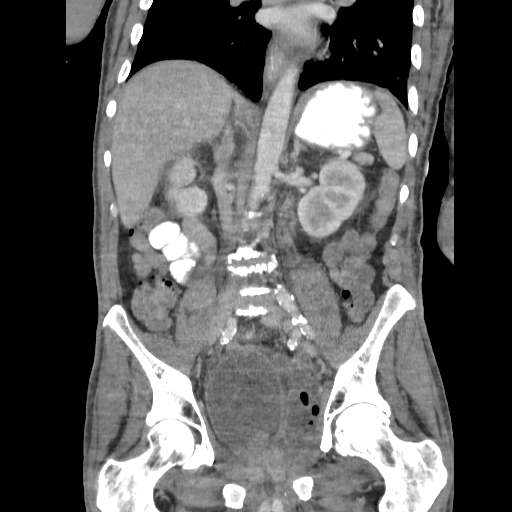

[16 of 46 positions shown; findings below may reference images not displayed]

FINDINGS: BODY WALL: Unremarkable.

LOWER CHEST: Partial clearing of bibasilar pneumonia or aspiration
pneumonitis. Coronary atherosclerosis.

ABDOMEN/PELVIS:

Liver: No focal abnormality.

Biliary: No evidence of biliary obstruction or stone.

Pancreas: Unremarkable.

Spleen: Unremarkable.

Adrenals: Unremarkable.

Kidneys and ureters: Mild bilateral hydronephrosis related to mass
effect in the pelvis. Symmetric renal enhancement.

Bladder: Gas in the bladder which is usually from instrumentation.
Bladder is circumferentially thick walled and hyper enhancing. The
mucosa is interrupted are on the left, likely communicating with the
pelvic abscess.

Reproductive: There is in the elongated water density collection in
the region of the posterior penile urethra measuring 5 cm in length
by 2.4 cm in width. This is incompletely imaged. Tissue extending
into the lower bladder lumen is likely hypertrophied prostate,
similar to 7887 imaging.

Bowel: No obstruction. Appendix not clearly visualized. No pericecal
inflammation. Colonic diverticulosis.

Retroperitoneum: In the region of previously seen gas-forming
collection, there is now a predominately fluid collection (although
also containing gas) in the extraperitoneal space of the lower
abdomen and pelvis. The collection extends from the patient's
incision (which is open at the level of the skin) inferiorly into
the left, along the left pelvic sidewall. The collection measures up
to 11 cm in AP dimension and 8 cm in width. There is thickening of
the lower left rectus abdominis which could represent postoperative
hemorrhage or inflammatory edema. The fascia around the rectum is
circumferentially thickened.

Peritoneum: No free fluid or gas.

Vascular: No acute abnormality.

OSSEOUS: Subchondral erosions in the mid and lower lumbar spine or
chronic and smooth, consider degenerative rather than infectious.

These results were called by telephone at the time of interpretation
on 03/07/2013 at 225 AM to Dr. SOK WARNER , who verbally acknowledged
these results.
IMPRESSION: 1. Recurrent or persistent abdominopelvic abscess, measuring up to
11 x 8 x 7 cm. The abscess communicates with the bladder lumen or
bladder wall on the left (presumably the site of recent tumor
resection).
2. Low-density expansion of the posterior penile urethra, likely
malpositioned Foley catheter with inflated balloon. Correlate with
exam.
3. Bilateral mild hydronephrosis from bladder distention or mass
effect in the pelvis.

## 2016-01-13 IMAGING — CR DG CHEST 2V
4 series · 4 of 4 positions shown · non-contrast
Comparison: Prior radiograph from 02/22/2013

CLINICAL DATA: Sepsis, fever

EXAM:
CHEST  2 VIEW

[w chest lat (1 of 2)]
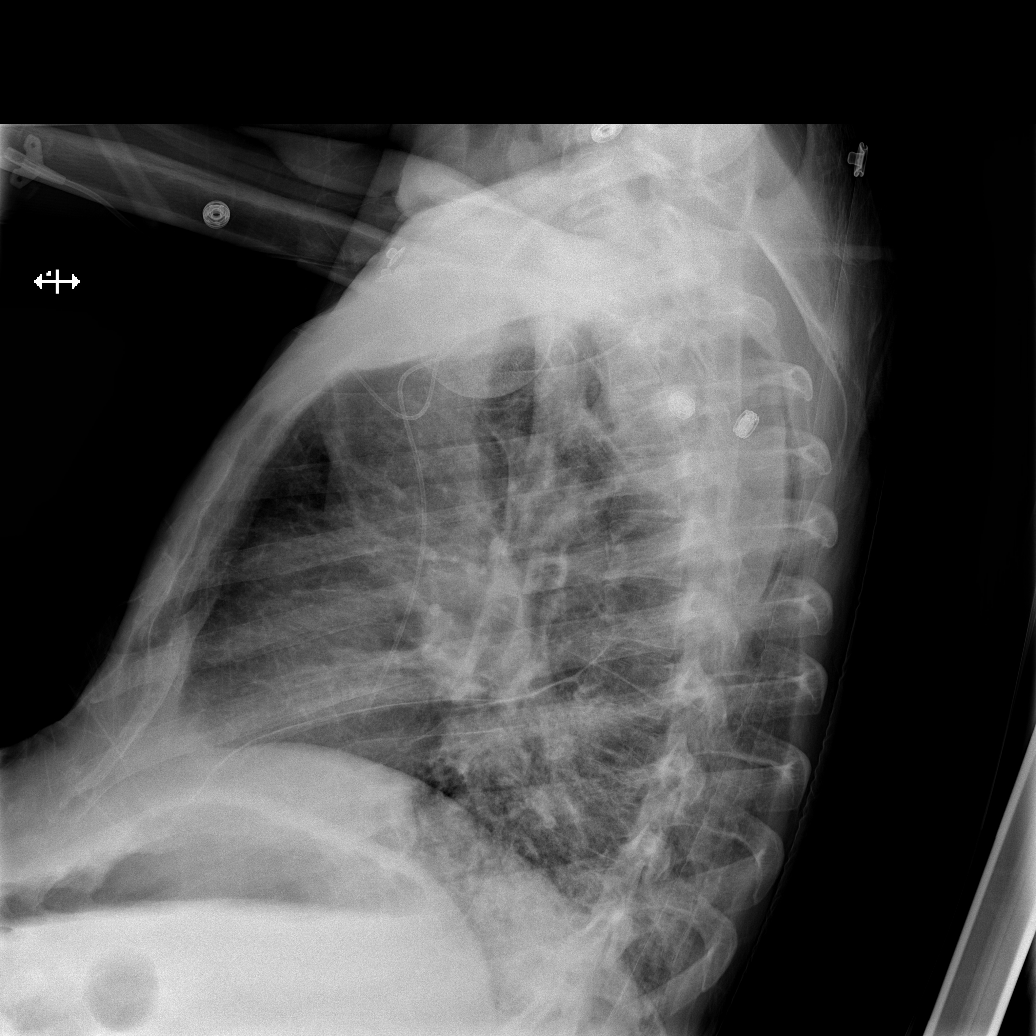

[w chest lat (2 of 2)]
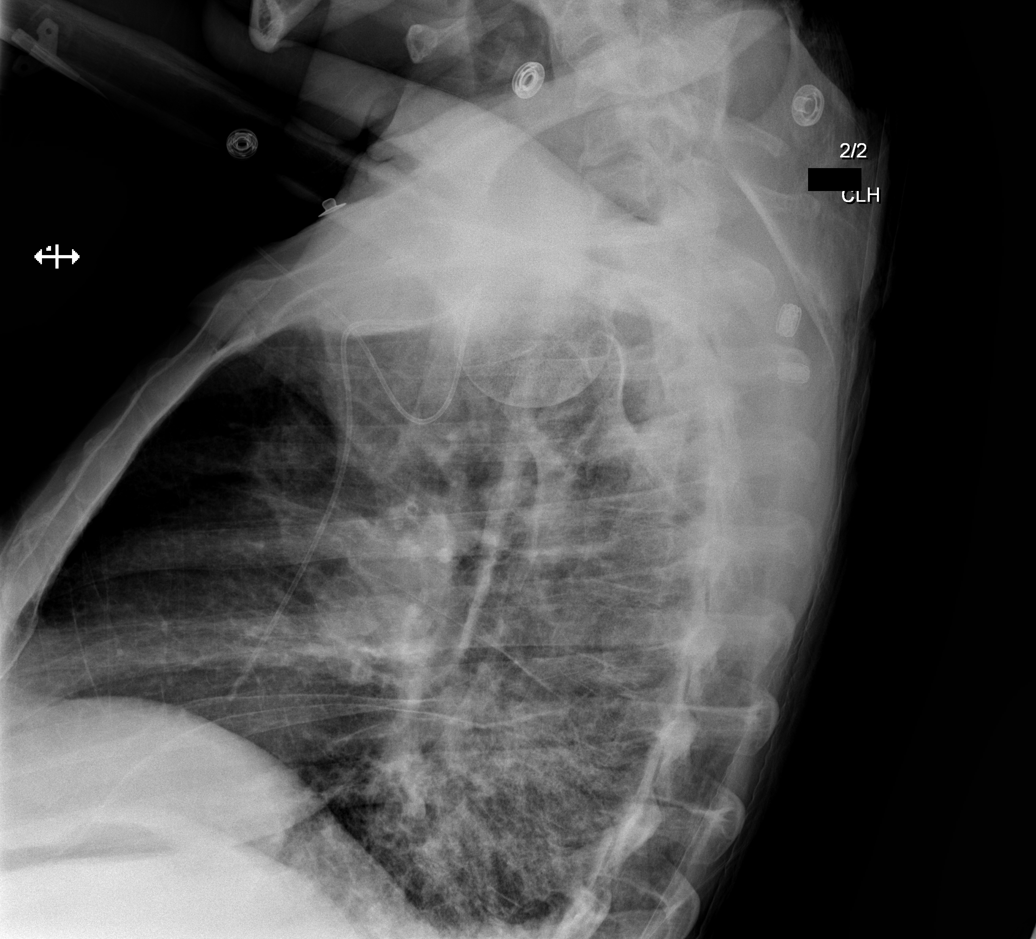

[x chest ap (1 of 2)]
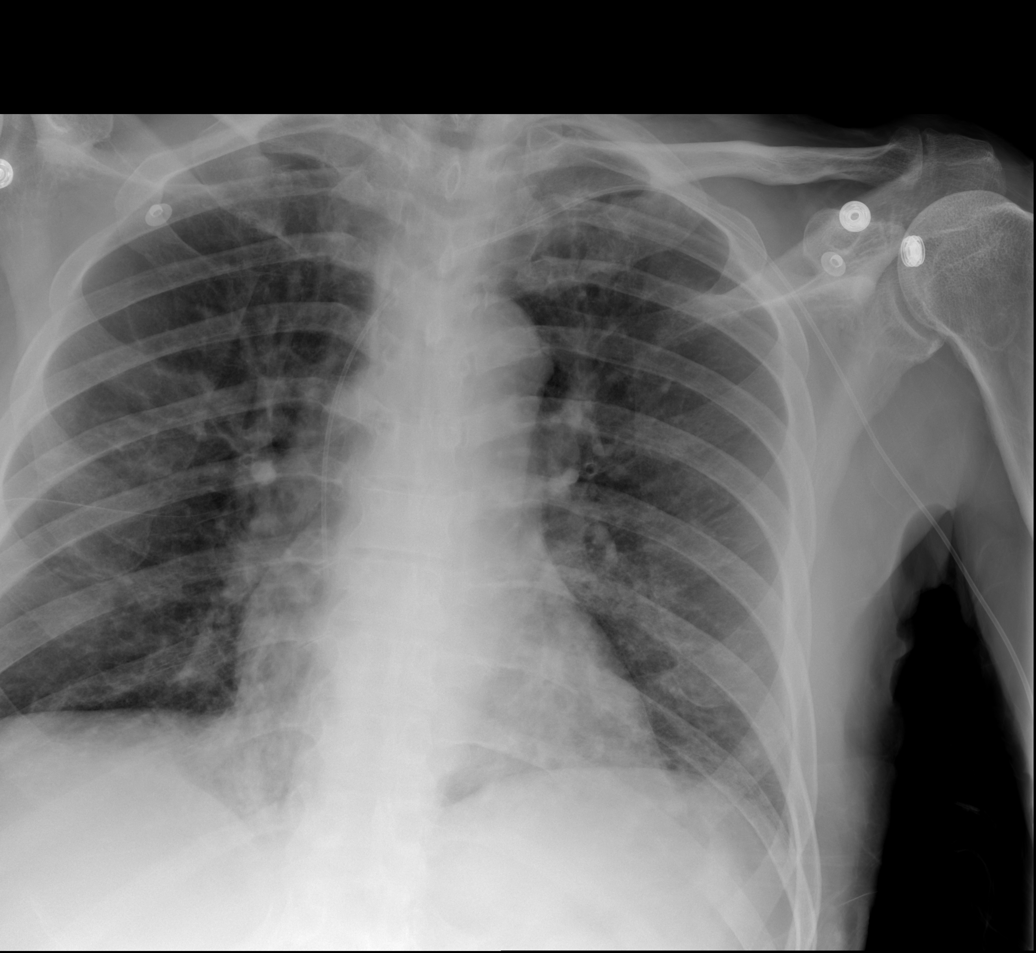

[x chest ap (2 of 2)]
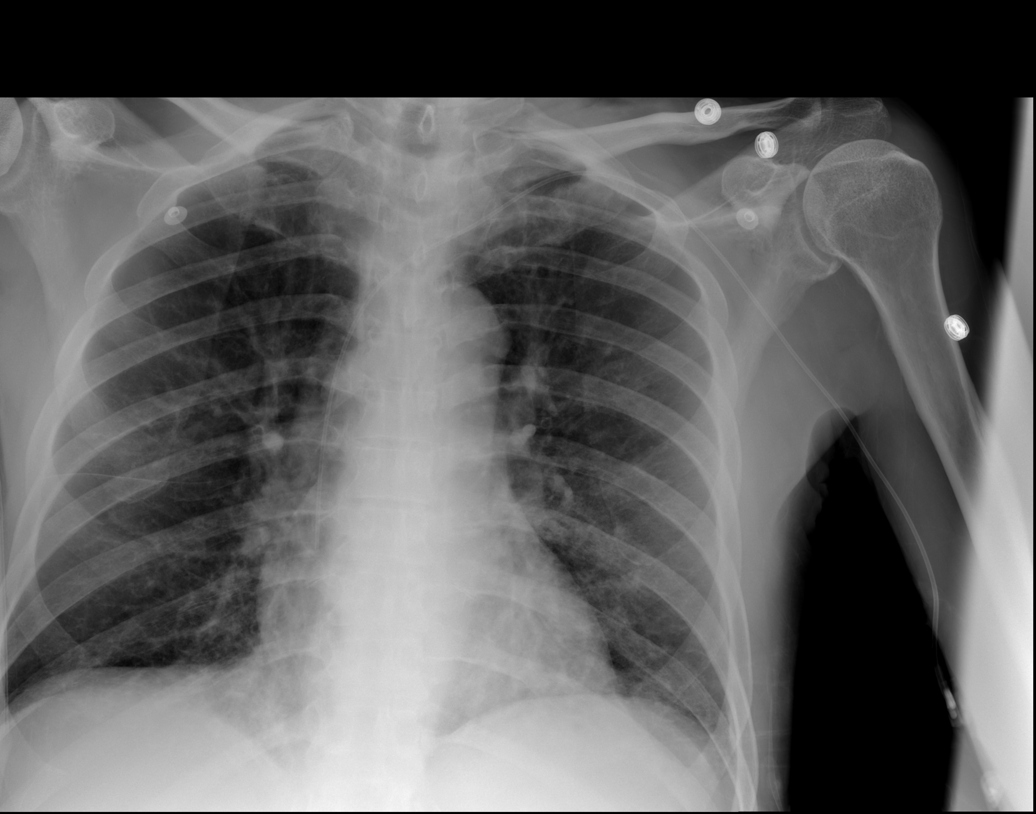

[4 of 4 positions shown; findings below may reference images not displayed]

FINDINGS: Tip of a left PICC catheter overlies the cavoatrial junction. The
cardiac and mediastinal silhouettes are stable in size and contour,
and remain within normal limits.

COPD changes again noted. There is patchy opacity within the
retrocardiac left lower lobe, which may reflect atelectasis or
possibly infiltrate. There is mild subsegmental atelectasis within
the bilateral lung bases. No airspace consolidation, pleural
effusion, or pulmonary edema is identified. There is no
pneumothorax.

No acute osseous abnormality identified.
IMPRESSION: 1. Patchy opacity within the retrocardiac left lower lobe, which may
reflect atelectasis or infiltrate.
2. COPD.

## 2016-02-15 ENCOUNTER — Ambulatory Visit
Admission: RE | Admit: 2016-02-15 | Discharge: 2016-02-15 | Disposition: A | Discharge status: Home / Self Care | Payer: Medicare (Managed Care) | Source: Ambulatory Visit | Attending: Family Medicine | Admitting: Family Medicine

## 2016-02-15 ENCOUNTER — Other Ambulatory Visit: Payer: Self-pay | Admitting: Family Medicine

## 2016-02-15 DIAGNOSIS — J189 Pneumonia, unspecified organism: Secondary | ICD-10-CM

## 2016-03-15 ENCOUNTER — Ambulatory Visit
Admission: RE | Admit: 2016-03-15 | Discharge: 2016-03-15 | Disposition: A | Discharge status: Home / Self Care | Payer: Medicare (Managed Care) | Source: Ambulatory Visit | Attending: Family Medicine | Admitting: Family Medicine

## 2016-03-15 ENCOUNTER — Other Ambulatory Visit: Payer: Self-pay | Admitting: Family Medicine

## 2016-03-15 DIAGNOSIS — J449 Chronic obstructive pulmonary disease, unspecified: Secondary | ICD-10-CM

## 2016-03-15 DIAGNOSIS — F17200 Nicotine dependence, unspecified, uncomplicated: Secondary | ICD-10-CM

## 2016-03-15 DIAGNOSIS — F1020 Alcohol dependence, uncomplicated: Secondary | ICD-10-CM

## 2016-04-25 ENCOUNTER — Ambulatory Visit
Admission: RE | Admit: 2016-04-25 | Discharge: 2016-04-25 | Disposition: A | Discharge status: Home / Self Care | Payer: Medicare (Managed Care) | Source: Ambulatory Visit | Attending: Nurse Practitioner | Admitting: Nurse Practitioner

## 2016-04-25 ENCOUNTER — Other Ambulatory Visit: Payer: Self-pay | Admitting: Nurse Practitioner

## 2016-04-25 DIAGNOSIS — J189 Pneumonia, unspecified organism: Secondary | ICD-10-CM

## 2016-04-26 ENCOUNTER — Other Ambulatory Visit: Payer: Self-pay | Admitting: Family Medicine

## 2016-04-26 DIAGNOSIS — R911 Solitary pulmonary nodule: Secondary | ICD-10-CM

## 2016-05-02 ENCOUNTER — Ambulatory Visit
Admission: RE | Admit: 2016-05-02 | Discharge: 2016-05-02 | Disposition: A | Discharge status: Home / Self Care | Payer: No Typology Code available for payment source | Source: Ambulatory Visit | Attending: Family Medicine | Admitting: Family Medicine

## 2016-05-02 DIAGNOSIS — R911 Solitary pulmonary nodule: Secondary | ICD-10-CM

## 2016-05-02 MED ORDER — IOPAMIDOL (ISOVUE-300) INJECTION 61%
75.0000 mL | Freq: Once | INTRAVENOUS | Status: AC | PRN
  Administered 2016-05-02: 75 mL via INTRAVENOUS

## 2016-10-19 ENCOUNTER — Ambulatory Visit
Admission: RE | Admit: 2016-10-19 | Discharge: 2016-10-19 | Disposition: A | Discharge status: Home / Self Care | Payer: Medicare (Managed Care) | Source: Ambulatory Visit | Attending: Family Medicine | Admitting: Family Medicine

## 2016-10-19 ENCOUNTER — Other Ambulatory Visit: Payer: Self-pay | Admitting: Family Medicine

## 2016-10-19 DIAGNOSIS — R0689 Other abnormalities of breathing: Secondary | ICD-10-CM

## 2017-03-29 ENCOUNTER — Other Ambulatory Visit (HOSPITAL_COMMUNITY): Payer: Self-pay | Admitting: Nurse Practitioner

## 2017-03-29 ENCOUNTER — Ambulatory Visit (HOSPITAL_COMMUNITY): Payer: Medicare (Managed Care)

## 2017-03-29 ENCOUNTER — Ambulatory Visit (HOSPITAL_COMMUNITY)
Admission: RE | Admit: 2017-03-29 | Discharge: 2017-03-29 | Disposition: A | Discharge status: Home / Self Care | Payer: Medicare (Managed Care) | Source: Ambulatory Visit | Attending: Nurse Practitioner | Admitting: Nurse Practitioner

## 2017-03-29 ENCOUNTER — Other Ambulatory Visit (HOSPITAL_COMMUNITY): Payer: Medicare (Managed Care)

## 2017-03-29 DIAGNOSIS — K4041 Unilateral inguinal hernia, with gangrene, recurrent: Secondary | ICD-10-CM | POA: Diagnosis not present

## 2017-03-29 DIAGNOSIS — I700 Atherosclerosis of aorta: Secondary | ICD-10-CM | POA: Diagnosis not present

## 2017-03-29 DIAGNOSIS — N5089 Other specified disorders of the male genital organs: Secondary | ICD-10-CM

## 2017-03-29 DIAGNOSIS — R1900 Intra-abdominal and pelvic swelling, mass and lump, unspecified site: Secondary | ICD-10-CM

## 2017-03-29 DIAGNOSIS — N400 Enlarged prostate without lower urinary tract symptoms: Secondary | ICD-10-CM | POA: Diagnosis not present

## 2017-03-29 DIAGNOSIS — N433 Hydrocele, unspecified: Secondary | ICD-10-CM | POA: Diagnosis not present

## 2017-03-29 DIAGNOSIS — R229 Localized swelling, mass and lump, unspecified: Secondary | ICD-10-CM | POA: Diagnosis present

## 2017-03-29 LAB — POCT I-STAT CREATININE: Creatinine, Ser: 0.9 mg/dL (ref 0.61–1.24)

## 2017-03-29 MED ORDER — IOPAMIDOL (ISOVUE-300) INJECTION 61%
INTRAVENOUS | Status: AC
  Filled 2017-03-29: qty 30

## 2017-03-29 MED ORDER — IOPAMIDOL (ISOVUE-300) INJECTION 61%
100.0000 mL | Freq: Once | INTRAVENOUS | Status: AC | PRN
  Administered 2017-03-29: 100 mL via INTRAVENOUS

## 2017-03-29 MED ORDER — IOPAMIDOL (ISOVUE-300) INJECTION 61%
INTRAVENOUS | Status: AC
  Filled 2017-03-29: qty 100

## 2017-03-29 MED ORDER — IOPAMIDOL (ISOVUE-300) INJECTION 61%
30.0000 mL | Freq: Once | INTRAVENOUS | Status: AC | PRN
  Administered 2017-03-29: 30 mL via ORAL

## 2017-07-05 ENCOUNTER — Other Ambulatory Visit: Payer: Self-pay | Admitting: Family Medicine

## 2017-07-05 ENCOUNTER — Ambulatory Visit
Admission: RE | Admit: 2017-07-05 | Discharge: 2017-07-05 | Disposition: A | Discharge status: Home / Self Care | Payer: Medicare (Managed Care) | Source: Ambulatory Visit | Attending: Family Medicine | Admitting: Family Medicine

## 2017-07-05 DIAGNOSIS — R9389 Abnormal findings on diagnostic imaging of other specified body structures: Secondary | ICD-10-CM

## 2017-07-05 DIAGNOSIS — R0989 Other specified symptoms and signs involving the circulatory and respiratory systems: Secondary | ICD-10-CM

## 2018-01-07 ENCOUNTER — Other Ambulatory Visit: Payer: Self-pay | Admitting: Internal Medicine

## 2018-01-07 DIAGNOSIS — R1110 Vomiting, unspecified: Secondary | ICD-10-CM

## 2018-01-13 ENCOUNTER — Ambulatory Visit
Admission: RE | Admit: 2018-01-13 | Discharge: 2018-01-13 | Disposition: A | Discharge status: Home / Self Care | Payer: Medicare (Managed Care) | Source: Ambulatory Visit | Attending: Internal Medicine | Admitting: Internal Medicine

## 2018-01-13 ENCOUNTER — Other Ambulatory Visit: Payer: Self-pay | Admitting: Internal Medicine

## 2018-01-13 DIAGNOSIS — R1110 Vomiting, unspecified: Secondary | ICD-10-CM

## 2018-01-26 ENCOUNTER — Emergency Department (HOSPITAL_COMMUNITY): Payer: Medicare (Managed Care)

## 2018-01-26 ENCOUNTER — Inpatient Hospital Stay (HOSPITAL_COMMUNITY)
Admission: EM | Admit: 2018-01-26 | Discharge: 2018-01-31 | DRG: 350 | Disposition: A | Discharge status: Skilled Nursing Facility | Payer: Medicare (Managed Care) | Source: Skilled Nursing Facility | Attending: Internal Medicine | Admitting: Internal Medicine

## 2018-01-26 ENCOUNTER — Encounter (HOSPITAL_COMMUNITY): Payer: Self-pay

## 2018-01-26 ENCOUNTER — Other Ambulatory Visit: Payer: Self-pay

## 2018-01-26 DIAGNOSIS — K209 Esophagitis, unspecified without bleeding: Secondary | ICD-10-CM | POA: Diagnosis present

## 2018-01-26 DIAGNOSIS — L8990 Pressure ulcer of unspecified site, unspecified stage: Secondary | ICD-10-CM | POA: Diagnosis present

## 2018-01-26 DIAGNOSIS — K4000 Bilateral inguinal hernia, with obstruction, without gangrene, not specified as recurrent: Principal | ICD-10-CM | POA: Diagnosis present

## 2018-01-26 DIAGNOSIS — K4090 Unilateral inguinal hernia, without obstruction or gangrene, not specified as recurrent: Secondary | ICD-10-CM

## 2018-01-26 DIAGNOSIS — I4510 Unspecified right bundle-branch block: Secondary | ICD-10-CM | POA: Diagnosis present

## 2018-01-26 DIAGNOSIS — K920 Hematemesis: Secondary | ICD-10-CM | POA: Diagnosis present

## 2018-01-26 DIAGNOSIS — J449 Chronic obstructive pulmonary disease, unspecified: Secondary | ICD-10-CM | POA: Diagnosis present

## 2018-01-26 DIAGNOSIS — Z8673 Personal history of transient ischemic attack (TIA), and cerebral infarction without residual deficits: Secondary | ICD-10-CM

## 2018-01-26 DIAGNOSIS — Z8719 Personal history of other diseases of the digestive system: Secondary | ICD-10-CM

## 2018-01-26 DIAGNOSIS — Z79899 Other long term (current) drug therapy: Secondary | ICD-10-CM

## 2018-01-26 DIAGNOSIS — Z7982 Long term (current) use of aspirin: Secondary | ICD-10-CM

## 2018-01-26 DIAGNOSIS — K56609 Unspecified intestinal obstruction, unspecified as to partial versus complete obstruction: Secondary | ICD-10-CM | POA: Diagnosis present

## 2018-01-26 DIAGNOSIS — I10 Essential (primary) hypertension: Secondary | ICD-10-CM | POA: Diagnosis present

## 2018-01-26 DIAGNOSIS — L89151 Pressure ulcer of sacral region, stage 1: Secondary | ICD-10-CM | POA: Diagnosis present

## 2018-01-26 DIAGNOSIS — K210 Gastro-esophageal reflux disease with esophagitis: Secondary | ICD-10-CM | POA: Diagnosis present

## 2018-01-26 DIAGNOSIS — F17210 Nicotine dependence, cigarettes, uncomplicated: Secondary | ICD-10-CM | POA: Diagnosis present

## 2018-01-26 DIAGNOSIS — K4030 Unilateral inguinal hernia, with obstruction, without gangrene, not specified as recurrent: Secondary | ICD-10-CM

## 2018-01-26 DIAGNOSIS — Z8551 Personal history of malignant neoplasm of bladder: Secondary | ICD-10-CM

## 2018-01-26 DIAGNOSIS — K226 Gastro-esophageal laceration-hemorrhage syndrome: Secondary | ICD-10-CM | POA: Diagnosis present

## 2018-01-26 DIAGNOSIS — E049 Nontoxic goiter, unspecified: Secondary | ICD-10-CM | POA: Diagnosis present

## 2018-01-26 DIAGNOSIS — Z7984 Long term (current) use of oral hypoglycemic drugs: Secondary | ICD-10-CM

## 2018-01-26 DIAGNOSIS — F0390 Unspecified dementia without behavioral disturbance: Secondary | ICD-10-CM | POA: Diagnosis present

## 2018-01-26 DIAGNOSIS — Z79891 Long term (current) use of opiate analgesic: Secondary | ICD-10-CM

## 2018-01-26 DIAGNOSIS — N400 Enlarged prostate without lower urinary tract symptoms: Secondary | ICD-10-CM | POA: Diagnosis present

## 2018-01-26 LAB — POC OCCULT BLOOD, ED: Fecal Occult Bld: NEGATIVE

## 2018-01-26 MED ORDER — SODIUM CHLORIDE 0.9 % IV SOLN
INTRAVENOUS | Status: DC
  Administered 2018-01-27: 03:00:00 via INTRAVENOUS

## 2018-01-26 MED ORDER — PANTOPRAZOLE SODIUM 40 MG IV SOLR
40.0000 mg | Freq: Once | INTRAVENOUS | Status: AC
  Administered 2018-01-27: 40 mg via INTRAVENOUS
  Filled 2018-01-26: qty 40

## 2018-01-26 MED ORDER — SODIUM CHLORIDE 0.9 % IV BOLUS
1000.0000 mL | Freq: Once | INTRAVENOUS | Status: AC
  Administered 2018-01-27: 1000 mL via INTRAVENOUS

## 2018-01-26 MED ORDER — ONDANSETRON HCL 4 MG/2ML IJ SOLN
4.0000 mg | Freq: Once | INTRAMUSCULAR | Status: AC
  Administered 2018-01-27: 4 mg via INTRAVENOUS
  Filled 2018-01-26: qty 2

## 2018-01-26 NOTE — ED Notes (Signed)
Bed: XL24 Expected date:  Expected time:  Means of arrival:  Comments: EMS 73 yo male vomiting coffee ground 140/80 HR 82-patient coughed up a chicken bone and is complaining of abdominal pain

## 2018-01-26 NOTE — ED Triage Notes (Signed)
Patient arrives from [facility] reports projectile vomiting coffee ground and "he coughed up a chicken bone". Complaining of abdominal pain x 2 days.

## 2018-01-26 NOTE — ED Provider Notes (Signed)
[facility] DEPT Provider Note   CSN: [id number] Arrival date & time: 01/26/18  2321     History   Chief Complaint Chief Complaint  Patient presents with  . Emesis  . Coughing up blood    HPI Jason [name] is a 73 y.o. male.  Level 5 caveat for dementia.  Patient from nursing facility with abdominal pain and coffee-ground emesis.  He is apparently been complaining of abdominal pain for the past 2 days.  Had several episodes of vomiting today that were coffee-ground by report.  Facility staff states he "coughed up a chicken bone".  Patient unable to give any reasonable history.  Denies any blood in his stool.  Reports left-sided lower abdominal pain for unknown duration.  No chest pain or shortness of breath. Medication list shows no blood thinners. History of COPD, bladder cancer status post resection, hypertension and dementia.  The history is provided by the patient, the EMS personnel and the nursing home. The history is limited by the condition of the patient.  Emesis    Past Medical History:  Diagnosis Date  . Bladder mass 02-10-13   surgery planned for this  . COPD (chronic obstructive pulmonary disease) (HCC)    previous history and current smoking  . Dementia 02/18/2013  . 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 Hypertension    States" never any meds for this"  . Right bundle branch block    history of this  . Stroke [facility])    Dementia, otherwise, no residual. 04-01-14 all resolved   . 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 Pneumonia 12/15/2015  . Acute encephalopathy 03/07/2013  . Postoperative intra-abdominal abscess 03/07/2013  . Hydronephrosis, bilateral 03/07/2013  . 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 UTI (lower urinary tract infection) 02/18/2013  . Sepsis ([place]) 02/18/2013  . Aspiration pneumonia ([place]) 02/18/2013  . Acute respiratory  failure with hypoxia ([place]) 02/18/2013  . Acute renal failure ([place]) 02/18/2013  . Dementia ([place]) 02/18/2013  . Hematuria 02/18/2013  . 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 Memory loss 03/27/2009  . CELLULITIS, HAND, LEFT 03/23/2009  . FATIGUE 03/15/2009  . ALCOHOL ABUSE 01/30/2008  . GOITER 10/25/2006  . NICOTINE ADDICTION 10/25/2006  . HYPERTENSION 10/25/2006  . COLONIC POLYPS, HX OF 10/25/2006  . BENIGN PROSTATIC HYPERTROPHY, HX OF 10/25/2006    Past Surgical History:  Procedure Laterality Date  . BALLOON DILATION N/A 04/07/2014   Procedure: BALLOON DILATION;  Surgeon: Ardis Hughs, MD;  Location: [facility];  Service: Urology;  Laterality: N/A;  . CYSTOSCOPY W/ RETROGRADES Bilateral 04/07/2014   Procedure: BILATERAL RETROGRADE PYELOGRAM;  Surgeon: Ardis Hughs, MD;  Location: [facility];  Service: Urology;  Laterality: Bilateral;  . CYSTOSCOPY/RETROGRADE/URETEROSCOPY Bilateral 02/13/2013   Procedure: BILATERAL RETROGRADE;  Surgeon: Ardis Hughs, MD;  Location: [facility];  Service: Urology;  Laterality: Bilateral;  . LAPAROTOMY N/A 02/18/2013   Procedure: EXPLORATORY LAPAROTOMY drainage of retroperitoneal abscess, drainage of Pre-Peritoneal abscess and Exploration of bladder perforation;  Surgeon: Imogene Burn. Georgette Dover, MD;  Location: [facility];  Service: General;  Laterality: N/A;  . TRANSURETHRAL RESECTION OF BLADDER TUMOR WITH GYRUS (TURBT-GYRUS) N/A 02/13/2013   Procedure: TRANSURETHRAL RESECTION OF BLADDER TUMOR WITH GYRUS (TURBT-GYRUS), bladder biopsies;  Surgeon: Ardis Hughs, MD;  Location: [facility];  Service: Urology;  Laterality: N/A;        Home Medications    Prior  to Admission medications   Medication Sig Start Date End Date Taking? Authorizing Provider  acetaminophen (TYLENOL) 325 MG tablet Take 2 tablets (650 mg total) by mouth every 6 (six) hours as needed for mild pain (or Fever >/= 101). 03/02/13   Ghimire, Henreitta Leber, MD  albuterol (PROVENTIL) (2.5 MG/3ML) 0.083%  nebulizer solution Take 3 mLs (2.5 mg total) by nebulization every 4 (four) hours as needed for wheezing or shortness of breath. Patient not taking: Reported on 04/01/2014 03/11/13   Dellinger, Bobby Rumpf, PA-C  aspirin EC 81 MG tablet Take 81 mg by mouth daily.    [provider]  cefdinir (OMNICEF) 300 MG capsule Take 1 capsule (300 mg total) by mouth 2 (two) times daily. 12/15/15   Kalman Shan Ratliff, DO  collagenase (SANTYL) ointment Apply topically daily. Apply to sacral wound daily. Patient not taking: Reported on 04/01/2014 03/02/13   Jonetta Osgood, MD  docusate sodium (COLACE) 100 MG capsule Take 1 capsule (100 mg total) by mouth 2 (two) times daily as needed (take to keep stool soft.). 02/13/13   Ardis Hughs, MD  feeding supplement, GLUCERNA SHAKE, (GLUCERNA SHAKE) LIQD Take 237 mLs by mouth daily. Patient not taking: Reported on 12/13/2015 03/02/13   Jonetta Osgood, MD  folic acid (FOLVITE) 1 MG tablet Take 1 tablet (1 mg total) by mouth daily. Patient not taking: Reported on 12/13/2015 03/02/13   Jonetta Osgood, MD  HYDROcodone-acetaminophen (NORCO) 5-325 MG per tablet Take 1 tablet by mouth every 6 (six) hours as needed for moderate pain. Patient not taking: Reported on 12/13/2015 04/07/14   Amaryllis Dyke, MD  iron polysaccharides (NIFEREX) 150 MG capsule Take 1 capsule (150 mg total) by mouth daily. Patient not taking: Reported on 12/13/2015 03/02/13   Jonetta Osgood, MD  metFORMIN (GLUCOPHAGE) 500 MG tablet Take 500 mg by mouth 2 (two) times daily with a meal.    [provider]  Multiple Vitamin (MULTIVITAMIN WITH MINERALS) TABS tablet Take 1 tablet by mouth daily. 03/02/13   Ghimire, Henreitta Leber, MD  QUEtiapine (SEROQUEL) 50 MG tablet Take 50 mg by mouth at bedtime.    [provider]  thiamine 100 MG tablet Take 1 tablet (100 mg total) by mouth daily. Patient not taking: Reported on 12/13/2015 03/02/13   Jonetta Osgood, MD    Family  History No family history on file.  Social History Social History   Tobacco Use  . Smoking status: Current Every Day Smoker    Packs/day: 1.00    Types: Cigarettes  . Smokeless tobacco: Never Used  Substance Use Topics  . Alcohol use: Yes    Comment: 2- 4 ounces  . Drug use: No     Allergies   Patient has no known allergies.   Review of Systems Review of Systems  Unable to perform ROS: Dementia  Gastrointestinal: Positive for vomiting.     Physical Exam Updated Vital Signs SpO2 93%   Physical Exam Vitals signs and nursing note reviewed.  Constitutional:      General: He is not in acute distress.    Appearance: He is well-developed.  HENT:     Head: Normocephalic and atraumatic.     Nose: Nose normal. No congestion or rhinorrhea.     Mouth/Throat:     Mouth: Mucous membranes are moist.     Pharynx: No oropharyngeal exudate.     Comments: Oropharynx is clear, no asymmetry or exudates Eyes:     Conjunctiva/sclera: Conjunctivae  normal.     Pupils: Pupils are equal, round, and reactive to light.  Neck:     Musculoskeletal: Normal range of motion and neck supple.     Comments: No meningismus. Cardiovascular:     Rate and Rhythm: Normal rate and regular rhythm.     Heart sounds: Normal heart sounds. No murmur.  Pulmonary:     Effort: Pulmonary effort is normal. No respiratory distress.     Breath sounds: Normal breath sounds.  Chest:     Chest wall: No tenderness.  Abdominal:     Palpations: Abdomen is soft.     Tenderness: There is abdominal tenderness. There is guarding. There is no rebound.     Comments: Abdomen soft.  Midline abdominal scars well-healed.  Left lower quadrant tenderness with voluntary guarding.  Right inguinal hernia is not able to be reduced.  There appears to be a smaller hernia on the left. Unknown chronicity.  Does appear to be tender to palpation.  Musculoskeletal: Normal range of motion.        General: No tenderness.  Skin:     General: Skin is warm.     Capillary Refill: Capillary refill takes less than 2 seconds.  Neurological:     General: No focal deficit present.     Mental Status: He is alert and oriented to person, place, and time. Mental status is at baseline.     Cranial Nerves: No cranial nerve deficit.     Motor: No abnormal muscle tone.     Coordination: Coordination normal.     Comments: No ataxia on finger to nose bilaterally. No pronator drift. 5/5 strength throughout. CN 2-12 intact.Equal grip strength. Sensation intact.   Psychiatric:        Behavior: Behavior normal.      ED Treatments / Results  Labs (all labs ordered are listed, but only abnormal results are displayed) Labs Reviewed  COMPREHENSIVE METABOLIC PANEL - Abnormal; Notable for the following components:      Result Value   Chloride 97 (*)    Glucose, Bld 163 (*)    Total Protein 8.6 (*)    All other components within normal limits  CBC WITH DIFFERENTIAL/PLATELET - Abnormal; Notable for the following components:   RBC 7.09 (*)    HCT 53.0 (*)    MCV 74.8 (*)    MCH 22.4 (*)    RDW 18.2 (*)    All other components within normal limits  PROTIME-INR  LACTIC ACID, PLASMA  LIPASE, BLOOD  TROPONIN I  URINALYSIS, ROUTINE W REFLEX MICROSCOPIC  POC OCCULT BLOOD, ED  TYPE AND SCREEN  ABO/RH    EKG EKG Interpretation  Date/Time:  Sunday January 26 2018 23:51:58 EST Ventricular Rate:  82 PR Interval:    QRS Duration: 131 QT Interval:  388 QTC Calculation: 454 R Axis:   62 Text Interpretation:  Sinus rhythm Right bundle branch block No significant change was found Confirmed by Ezequiel Essex [phone]) on 01/27/2018 12:26:09 AM   Radiology Ct Abdomen Pelvis W Contrast  Result Date: 01/27/2018 CLINICAL DATA:  Abdominal pain EXAM: CT ABDOMEN AND PELVIS WITH CONTRAST TECHNIQUE: Multidetector CT imaging of the abdomen and pelvis was performed using the standard protocol following bolus administration of intravenous  contrast. CONTRAST:  181mL ISOVUE-300 IOPAMIDOL (ISOVUE-300) INJECTION 61% COMPARISON:  CT 03/29/2017 FINDINGS: Lower chest: Lung bases demonstrate mildly nodular left greater than right airspace disease. No pleural effusion. Heart size is normal. Distal esophageal thickening. Small focus of gas  and fluid extending toward the extraluminal surface of the esophagus. No mediastinal air. Hepatobiliary: No focal liver abnormality is seen. No gallstones, gallbladder wall thickening, or biliary dilatation. Pancreas: Unremarkable. No pancreatic ductal dilatation or surrounding inflammatory changes. Spleen: Normal in size without focal abnormality. Adrenals/Urinary Tract: Adrenal glands are normal. No hydronephrosis. The bladder is normal Stomach/Bowel: The stomach is nonenlarged. Multiple dilated fluid-filled loops of small bowel up to 3 cm. Transition point related to a right bowel containing inguinal hernia. Diffuse diverticular disease of the colon. Vascular/Lymphatic: Extensive aortic atherosclerosis. No aneurysmal dilatation. Stable distal esophageal lymph nodes Reproductive: Massively enlarged prostate gland with mass effect on the bladder. Large bilateral scrotal hydroceles. Other: No free air or significant free fluid Musculoskeletal: Degenerative changes. No acute or suspicious abnormality. IMPRESSION: 1. Multiple loops of dilated small bowel, consistent with a bowel obstruction. Transition point is at a bowel containing right inguinal hernia. 2. Distal esophageal thickening suggesting esophagitis. Small gas and fluid collection extending into the mucosa of the distal esophagus, possibly representing a small mucosal tear. There is no pneumomediastinum to suggest a through and through perforation. 3. Nodular airspace disease in the left greater than right lung base which may reflect pneumonia or aspiration 4. Markedly enlarged prostate gland with mass effect on the bladder Electronically Signed   By: Donavan Foil  M.D.   On: 01/27/2018 02:00   Dg Abdomen Acute W/chest  Result Date: 01/27/2018 CLINICAL DATA:  Abdominal pain EXAM: DG ABDOMEN ACUTE W/ 1V CHEST COMPARISON:  07/05/2017, 10/19/2016, CT chest 05/02/2016 FINDINGS: Single-view chest demonstrates airspace disease at the left base. Normal heart size. Decubitus and supine views of the abdomen demonstrate a nonobstructed bowel-gas pattern. No free air. Residual contrast within the bowel. IMPRESSION: 1. Airspace disease at the left base may reflect atelectasis, pneumonia or aspiration 2. Nonobstructed bowel-gas pattern Electronically Signed   By: Donavan Foil M.D.   On: 01/27/2018 00:41    Procedures Hernia reduction Date/Time: 01/27/2018 2:00 AM Performed by: Ezequiel Essex, MD Authorized by: Ezequiel Essex, MD  Unsuccessful attempt Consent: The procedure was performed in an emergent situation. Verbal consent obtained. Risks and benefits: risks, benefits and alternatives were discussed Consent given by: patient Test results: test results available and properly labeled Imaging studies: imaging studies available Patient identity confirmed: verbally with patient Time out: Immediately prior to procedure a "time out" was called to verify the correct patient, procedure, equipment, support staff and site/side marked as required. Local anesthesia used: no  Anesthesia: Local anesthesia used: no  Sedation: Patient sedated: no  Patient tolerance: Patient tolerated the procedure well with no immediate complications Comments: Non successful.     (including critical care time)  Medications Ordered in ED Medications  sodium chloride 0.9 % bolus 1,000 mL (0 mLs Intravenous Stopped 01/27/18 0226)    And  0.9 %  sodium chloride infusion (has no administration in time range)  iopamidol (ISOVUE-300) 61 % injection (has no administration in time range)  sodium chloride (PF) 0.9 % injection (has no administration in time range)  0.9 %  sodium  chloride infusion (has no administration in time range)  pantoprazole (PROTONIX) injection 40 mg (40 mg Intravenous Given 01/27/18 0016)  ondansetron (ZOFRAN) injection 4 mg (4 mg Intravenous Given 01/27/18 0014)  iopamidol (ISOVUE-300) 61 % injection 100 mL (100 mLs Intravenous Contrast Given 01/27/18 0110)  HYDROmorphone (DILAUDID) injection 0.5 mg (0.5 mg Intravenous Given 01/27/18 0226)     Initial Impression / Assessment and Plan / ED Course  I have reviewed the triage vital signs and the nursing notes.  Pertinent labs & imaging results that were available during my care of the patient were reviewed by me and considered in my medical decision making (see chart for details).    Patient with lower abdominal pain and reported coffee-ground emesis from nursing home.  Staff reports he "coughed up a chicken bone". Right inguinal hernia on exam and is not able to be reduced.  No gross blood on rectal exam.  Vitals are stable.  We will check x-ray, labs, Hemoccult, IV PPI, IV fluids started  Labs show stable hemoglobin.  LFTs and lipase normal.  Acute abdominal series negative for obstruction.  Hemoccult is negative  CT scan shows small bowel obstruction with transition point in right inguinal hernia.  Also evidence of esophagitis with small fluid collection and mucosal tear but no evidence of through and through perforation.  Suspect incarcerated hernia causing small bowel obstruction.  Discussed with Dr. Ninfa Linden who will evaluate.  Will hold off on NG tube at this point given patient's irritation of esophagus on CT scan.  After additional pain medications and Trendelenburg positioning, Dr. Ninfa Linden of surgery was able to reduce the hernia without difficulty. States no need for NG tube at this point.  Plan admission to hospitalist service for monitoring.  Discussed with Dr. Blaine Hamper.  Patient is a PACE patient by internal medicine teaching service at [facility] is capped.  D/w Dr. Evie Lacks of  internal medicine teaching service. He accepts patient to [name] under service of Dr. Rebeca Alert.  Final Clinical Impressions(s) / ED Diagnoses   Final diagnoses:  Incarcerated inguinal hernia  Small bowel obstruction ([place])  Coffee ground emesis    ED Discharge Orders    None       Ezequiel Essex, MD 01/27/18 [phone]

## 2018-01-27 ENCOUNTER — Emergency Department (HOSPITAL_COMMUNITY): Payer: Medicare (Managed Care)

## 2018-01-27 ENCOUNTER — Encounter (HOSPITAL_COMMUNITY): Payer: Self-pay

## 2018-01-27 DIAGNOSIS — Z8673 Personal history of transient ischemic attack (TIA), and cerebral infarction without residual deficits: Secondary | ICD-10-CM | POA: Diagnosis not present

## 2018-01-27 DIAGNOSIS — F17210 Nicotine dependence, cigarettes, uncomplicated: Secondary | ICD-10-CM | POA: Diagnosis not present

## 2018-01-27 DIAGNOSIS — K226 Gastro-esophageal laceration-hemorrhage syndrome: Secondary | ICD-10-CM | POA: Diagnosis present

## 2018-01-27 DIAGNOSIS — K56609 Unspecified intestinal obstruction, unspecified as to partial versus complete obstruction: Secondary | ICD-10-CM | POA: Diagnosis present

## 2018-01-27 DIAGNOSIS — Z7982 Long term (current) use of aspirin: Secondary | ICD-10-CM | POA: Diagnosis not present

## 2018-01-27 DIAGNOSIS — F0390 Unspecified dementia without behavioral disturbance: Secondary | ICD-10-CM

## 2018-01-27 DIAGNOSIS — K4030 Unilateral inguinal hernia, with obstruction, without gangrene, not specified as recurrent: Secondary | ICD-10-CM | POA: Diagnosis not present

## 2018-01-27 DIAGNOSIS — Z79899 Other long term (current) drug therapy: Secondary | ICD-10-CM

## 2018-01-27 DIAGNOSIS — K209 Esophagitis, unspecified without bleeding: Secondary | ICD-10-CM | POA: Diagnosis present

## 2018-01-27 DIAGNOSIS — E049 Nontoxic goiter, unspecified: Secondary | ICD-10-CM | POA: Diagnosis present

## 2018-01-27 DIAGNOSIS — Z8551 Personal history of malignant neoplasm of bladder: Secondary | ICD-10-CM

## 2018-01-27 DIAGNOSIS — L89151 Pressure ulcer of sacral region, stage 1: Secondary | ICD-10-CM | POA: Diagnosis present

## 2018-01-27 DIAGNOSIS — Z7984 Long term (current) use of oral hypoglycemic drugs: Secondary | ICD-10-CM | POA: Diagnosis not present

## 2018-01-27 DIAGNOSIS — J449 Chronic obstructive pulmonary disease, unspecified: Secondary | ICD-10-CM

## 2018-01-27 DIAGNOSIS — Z79891 Long term (current) use of opiate analgesic: Secondary | ICD-10-CM | POA: Diagnosis not present

## 2018-01-27 DIAGNOSIS — K210 Gastro-esophageal reflux disease with esophagitis: Secondary | ICD-10-CM | POA: Diagnosis present

## 2018-01-27 DIAGNOSIS — I10 Essential (primary) hypertension: Secondary | ICD-10-CM | POA: Diagnosis present

## 2018-01-27 DIAGNOSIS — Z8719 Personal history of other diseases of the digestive system: Secondary | ICD-10-CM | POA: Diagnosis not present

## 2018-01-27 DIAGNOSIS — N400 Enlarged prostate without lower urinary tract symptoms: Secondary | ICD-10-CM | POA: Diagnosis not present

## 2018-01-27 DIAGNOSIS — I4510 Unspecified right bundle-branch block: Secondary | ICD-10-CM | POA: Diagnosis present

## 2018-01-27 DIAGNOSIS — K4000 Bilateral inguinal hernia, with obstruction, without gangrene, not specified as recurrent: Secondary | ICD-10-CM | POA: Diagnosis present

## 2018-01-27 DIAGNOSIS — K4090 Unilateral inguinal hernia, without obstruction or gangrene, not specified as recurrent: Secondary | ICD-10-CM

## 2018-01-27 DIAGNOSIS — K920 Hematemesis: Secondary | ICD-10-CM | POA: Diagnosis not present

## 2018-01-27 LAB — TYPE AND SCREEN
ABO/RH(D): AB POS
Antibody Screen: POSITIVE
DAT, IgG: NEGATIVE

## 2018-01-27 LAB — CBC WITH DIFFERENTIAL/PLATELET
ABS IMMATURE GRANULOCYTES: 0.02 10*3/uL (ref 0.00–0.07)
Basophils Absolute: 0 10*3/uL (ref 0.0–0.1)
Basophils Relative: 0 %
Eosinophils Absolute: 0 10*3/uL (ref 0.0–0.5)
Eosinophils Relative: 0 %
HCT: 53 % — ABNORMAL HIGH (ref 39.0–52.0)
HEMOGLOBIN: 15.9 g/dL (ref 13.0–17.0)
Immature Granulocytes: 0 %
Lymphocytes Relative: 15 %
Lymphs Abs: 1.3 10*3/uL (ref 0.7–4.0)
MCH: 22.4 pg — AB (ref 26.0–34.0)
MCHC: 30 g/dL (ref 30.0–36.0)
MCV: 74.8 fL — ABNORMAL LOW (ref 80.0–100.0)
MONO ABS: 0.6 10*3/uL (ref 0.1–1.0)
Monocytes Relative: 7 %
Neutro Abs: 6.4 10*3/uL (ref 1.7–7.7)
Neutrophils Relative %: 78 %
Platelets: 265 10*3/uL (ref 150–400)
RBC: 7.09 MIL/uL — ABNORMAL HIGH (ref 4.22–5.81)
RDW: 18.2 % — ABNORMAL HIGH (ref 11.5–15.5)
WBC: 8.3 10*3/uL (ref 4.0–10.5)
nRBC: 0 % (ref 0.0–0.2)

## 2018-01-27 LAB — COMPREHENSIVE METABOLIC PANEL
ALT: 21 U/L (ref 0–44)
AST: 22 U/L (ref 15–41)
Albumin: 4.4 g/dL (ref 3.5–5.0)
Alkaline Phosphatase: 107 U/L (ref 38–126)
Anion gap: 11 (ref 5–15)
BUN: 13 mg/dL (ref 8–23)
CHLORIDE: 97 mmol/L — AB (ref 98–111)
CO2: 30 mmol/L (ref 22–32)
Calcium: 9.8 mg/dL (ref 8.9–10.3)
Creatinine, Ser: 1.17 mg/dL (ref 0.61–1.24)
GFR calc Af Amer: >60 mL/min (ref >60)
Glucose, Bld: 163 mg/dL — ABNORMAL HIGH (ref 70–99)
Potassium: 3.9 mmol/L (ref 3.5–5.1)
Sodium: 138 mmol/L (ref 135–145)
Total Bilirubin: 0.5 mg/dL (ref 0.3–1.2)
Total Protein: 8.6 g/dL — ABNORMAL HIGH (ref 6.5–8.1)

## 2018-01-27 LAB — PROTIME-INR
INR: 0.97
Prothrombin Time: 12.8 seconds (ref 11.4–15.2)

## 2018-01-27 LAB — LACTIC ACID, PLASMA: Lactic Acid, Venous: 1.7 mmol/L (ref 0.5–1.9)

## 2018-01-27 LAB — TROPONIN I: Troponin I: <0.03 ng/mL (ref <0.03)

## 2018-01-27 LAB — LIPASE, BLOOD: LIPASE: 32 U/L (ref 11–51)

## 2018-01-27 MED ORDER — HYDROMORPHONE HCL 1 MG/ML IJ SOLN
0.5000 mg | Freq: Once | INTRAMUSCULAR | Status: AC
  Administered 2018-01-27: 0.5 mg via INTRAVENOUS
  Filled 2018-01-27: qty 1

## 2018-01-27 MED ORDER — IOPAMIDOL (ISOVUE-300) INJECTION 61%
100.0000 mL | Freq: Once | INTRAVENOUS | Status: AC | PRN
  Administered 2018-01-27: 100 mL via INTRAVENOUS

## 2018-01-27 MED ORDER — PANTOPRAZOLE SODIUM 40 MG IV SOLR: 40.0000 mg | Freq: Once | INTRAVENOUS | Status: DC

## 2018-01-27 MED ORDER — PANTOPRAZOLE SODIUM 40 MG IV SOLR
40.0000 mg | Freq: Every day | INTRAVENOUS | Status: DC
  Administered 2018-01-27 – 2018-01-31 (×4): 40 mg via INTRAVENOUS
  Filled 2018-01-27 (×4): qty 40

## 2018-01-27 MED ORDER — ALBUTEROL SULFATE (2.5 MG/3ML) 0.083% IN NEBU: 2.5000 mg | INHALATION_SOLUTION | Freq: Four times a day (QID) | RESPIRATORY_TRACT | Status: DC | PRN

## 2018-01-27 MED ORDER — PROMETHAZINE HCL 25 MG/ML IJ SOLN: 12.5000 mg | Freq: Four times a day (QID) | INTRAMUSCULAR | Status: DC | PRN

## 2018-01-27 MED ORDER — ACETAMINOPHEN 650 MG RE SUPP: 650.0000 mg | Freq: Four times a day (QID) | RECTAL | Status: DC | PRN

## 2018-01-27 MED ORDER — SODIUM CHLORIDE 0.9 % IV SOLN
INTRAVENOUS | Status: AC
  Administered 2018-01-27: 17:00:00 via INTRAVENOUS

## 2018-01-27 MED ORDER — ENOXAPARIN SODIUM 40 MG/0.4ML ~~LOC~~ SOLN: 40.0000 mg | SUBCUTANEOUS | Status: DC

## 2018-01-27 MED ORDER — IOPAMIDOL (ISOVUE-300) INJECTION 61%
INTRAVENOUS | Status: AC
  Filled 2018-01-27: qty 100

## 2018-01-27 MED ORDER — SODIUM CHLORIDE 0.9 % IV SOLN: INTRAVENOUS | Status: DC

## 2018-01-27 MED ORDER — SODIUM CHLORIDE (PF) 0.9 % IJ SOLN
INTRAMUSCULAR | Status: AC
  Filled 2018-01-27: qty 50

## 2018-01-27 MED ORDER — ACETAMINOPHEN 325 MG PO TABS: 650.0000 mg | ORAL_TABLET | Freq: Four times a day (QID) | ORAL | Status: DC | PRN

## 2018-01-27 NOTE — Plan of Care (Signed)
  Problem: Education: Goal: Knowledge of General Education information will improve Description: Including pain rating scale, medication(s)/side effects and non-pharmacologic comfort measures Outcome: Progressing   Problem: Clinical Measurements: Goal: Ability to maintain clinical measurements within normal limits will improve Outcome: Progressing Goal: Diagnostic test results will improve Outcome: Progressing Goal: Cardiovascular complication will be avoided Outcome: Progressing   

## 2018-01-27 NOTE — H&P (Signed)
Date: 01/27/2018               Patient Name:  Jason [name] MRN: [id number]  DOB: [birth year] Age / Sex: 73 y.o., male   PCP: Janifer Adie, MD         Medical Service: Internal Medicine Teaching Service         Attending Physician: Dr. Rebeca Alert Raynaldo Opitz, MD    First Contact: Dr. Lucianne Muss Pager: [phone]  Second Contact: Dr. Tarri Abernethy Pager: [phone]       After Hours (After 5p/  First Contact Pager: [phone]  weekends / holidays): Second Contact Pager: [phone]   Chief Complaint: coffee ground emesis  History of Present Illness: Jason [name] is a 73yo male  with dementia, copd, bladder cancer s/p resection whose primary care physician is with [facility] that [facility] from [facility] with abdominal pain and coffee-ground emesis.  History is limited due to patient's dementia. He is alert and answers questions with yes or no answers.  He is oriented to person but not place or time.   He is currently able to tell me he does not have abdominal pain, chest pain or shortness of breath.  He is unable to provide a history of the past 24 hours.  Per chart review, he has been complaining of abdominal pain for 2 days and had several episodes of coffee ground emesis today. The facility staff also states that he coughed up a chicken bone. No evidence of melena or BRBPR. He is not on any blood thinners.    In the ED, he was found to have a non-reducible inguinal hernia and CT scan revealed an SBO with transition point in the right inguinal hernia, also evidence of esophagitis with small fluid collection and mucosal tear but no perforation. Surgery was consulted and was able to reduce the hernia without difficulty. No need for NG tube.  FOBT was negative. Hemoglobin was stable. He was given IVFs and IV PPI.   Meds:  Current Meds  Medication Sig  . promethazine (PHENERGAN) 25 MG tablet Take 25 mg by mouth every 6 (six) hours as needed for nausea or vomiting.  . sertraline  (ZOLOFT) 50 MG tablet Take 50 mg by mouth daily.     Allergies: Allergies as of 01/26/2018  . (No Known Allergies)   Past Medical History:  Diagnosis Date  . Bladder mass 02-10-13   surgery planned for this  . COPD (chronic obstructive pulmonary disease) (HCC)    previous history and current smoking  . Dementia ([place]) 02/18/2013  . 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 Hypertension    States" never any meds for this"  . Right bundle branch block    history of this  . Stroke [facility])    Dementia, otherwise, no residual. 04-01-14 all resolved   . 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 rate 16, SpO2 92 %. Physical Exam  Constitutional: He is well-developed, well-nourished, and in no distress.  Cardiovascular: Normal rate, regular rhythm and normal heart sounds. Exam reveals no gallop and no friction rub.  No murmur heard. Pulmonary/Chest: Effort normal. No respiratory distress. He has  no wheezes.  Few scattered rhonci  Abdominal: Soft. Bowel sounds are normal. He exhibits no distension. There is no abdominal tenderness. There is no rebound and no guarding.  Midline surgical scar  Musculoskeletal:        General: No edema.  Neurological: He is alert.  Skin: Skin is warm and dry.    Labs:  WBC 8.3 Hgb 15.9 CMP unremarkable, creatine 1.17, BUN 13, normal LFTs Lactate 1.7 Trop < 0.03 INR 0.97  EKG: personally reviewed my interpretation is NSR, RBBB, t wave inversions V1-3, similar to previous   DG Abd: 1. Airspace disease at the left base may reflect atelectasis, pneumonia or aspiration 2. Nonobstructed bowel-gas pattern  CT a/p: 1. Multiple loops of  dilated small bowel, consistent with a bowel obstruction. Transition point is at a bowel containing right inguinal hernia. 2. Distal esophageal thickening suggesting esophagitis. Small gas and fluid collection extending into the mucosa of the distal esophagus, possibly representing a small mucosal tear. There is no pneumomediastinum to suggest a through and through perforation. 3. Nodular airspace disease in the left greater than right lung base which may reflect pneumonia or aspiration 4. Markedly enlarged prostate gland with mass effect on the bladder  Assessment & Plan by Problem: Active Problems:   Small bowel obstruction [facility])  Jason [name] is a 72yo male [facility] patient with dementia, copd, bladder cancer s/p resection who presents from a nursing facility with abdominal pain and coffee-ground emesis. Found to have SBO 2/2 incarcerated inguinal hernia which has now been successfully reduced.   SBO 2/2 Incarcerate Right Inguinal Hernia: successfully reduced in the ED.  -Surgery following, appreciate recommendations - clear liquid diet  Coffee ground emesis, esophagitis: Hemodynamically stable. No evidence of full thickness tear on CT. Hemoglobin WNLs and stable.  Treating conservatively. - IV PPI - phenergan prn  - s/p 1L NS bolus, now on maintenance 100cc/hr - avoid NG tube for now given esophagitis -cbc in am  COPD: continue home albuterol nebulizer prn wheezing or sob  DVT ppx: SCDs FEN/GI: replete lytes prn, 100cc/hr NS, clear liquid diet advance as tolerated Code: Full code  Dispo: Admit patient to Inpatient with expected length of stay greater than 2 midnights.  Signed: Valinda Party, DO 01/27/2018, 8:10 AM  Pager: [phone]

## 2018-01-27 NOTE — Progress Notes (Signed)
Checked on pt this afternoon and he was resting comfortably. He denies abdominal pain, nausea or vomiting. He states no pain in his right groin. He states he is tolerating clear liquids. No distention or TTP of abdomen. Okay to advance to FLD.   Jackson Latino, [facility] Surgery Pager [phone]

## 2018-01-27 NOTE — ED Notes (Signed)
ED TO INPATIENT HANDOFF REPORT  Name/Age/Gender Jason [name] 73 y.o. male  Code Status Code Status History    Date Active Date Inactive Code Status Order ID Comments User Context   12/13/2015 1701 12/15/2015 2340 Full Code [id number]  Juliet Rude, MD Inpatient   03/07/2013 1444 03/11/2013 1650 Full Code [id number]  Azzie Roup, MD Inpatient   03/07/2013 0519 03/07/2013 1444 Full Code [id number]  Theressa Millard, MD Inpatient   02/18/2013 1014 03/02/2013 2036 Full Code [id number]  Samuella Cota, MD Inpatient      Home/SNF/Other Nursing Home  Chief Complaint Emesis; Cough  Level of Care/Admitting Diagnosis ED Disposition    ED Disposition Condition [place]: [place] [100100]  Level of Care: Med-Surg [16]  Diagnosis: Small bowel obstruction [facility]) [[id number]]  Admitting Physician: Oda Kilts [[id number]]  Attending Physician: Oda Kilts [id number]  PT Class (Do Not Modify): Observation [104]  PT Acc Code (Do Not Modify): Observation [10022]       Medical History Past Medical History:  Diagnosis Date  . Bladder mass 02-10-13   surgery planned for this  . COPD (chronic obstructive pulmonary disease) (HCC)    previous history and current smoking  . Dementia ([place]) 02/18/2013  . 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 Hypertension    States" never any meds for this"  . Right bundle branch block    history of this  . Stroke [facility])    Dementia, otherwise, no residual. 04-01-14 all resolved   . 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    Allergies No Known Allergies  IV Location/Drains/Wounds Patient Lines/Drains/Airways Status   Active Line/Drains/Airways    Name:   Placement date:   Placement time:   Site:   Days:   Peripheral IV 01/27/18 Right Antecubital   01/27/18    0010    Antecubital   less than 1   PICC / Midline Single Lumen 17/00/17 PICC Left Basilic 45 cm 0 cm   49/44/96    7591     Basilic   6384   Incision (Closed) 02/19/13 Abdomen   02/19/13    0002     1803   Pressure Ulcer Unstageable - Full thickness tissue loss in which the base of the ulcer is covered by slough (yellow, tan, gray, green or brown) and/or eschar (tan, brown or black) in the wound bed.   -    -               Labs/Imaging Results for orders placed or performed during the hospital encounter of 01/26/18 (from the past 48 hour(s))  POC occult blood, ED Provider will collect     Status: None   Collection Time: 01/26/18 11:41 PM  Result Value Ref Range   Fecal Occult Bld NEGATIVE NEGATIVE  Comprehensive metabolic panel     Status: Abnormal   Collection Time: 01/27/18 12:04 AM  Result Value Ref Range   Sodium 138 135 - 145 mmol/L   Potassium 3.9 3.5 - 5.1 mmol/L   Chloride 97 (L) 98 - 111 mmol/L   CO2 30 22 - 32 mmol/L   Glucose, Bld 163 (H) 70 - 99 mg/dL   BUN 13 8 - 23 mg/dL   Creatinine, Ser 1.17 0.61 - 1.24 mg/dL   Calcium 9.8 8.9 - 10.3 mg/dL   Total Protein 8.6 (H) 6.5 - 8.1 g/dL   Albumin 4.4 3.5 - 5.0 g/dL  AST 22 15 - 41 U/L   ALT 21 0 - 44 U/L   Alkaline Phosphatase 107 38 - 126 U/L   Total Bilirubin 0.5 0.3 - 1.2 mg/dL   GFR calc non Af Amer >60 >60 mL/min   GFR calc Af Amer >60 >60 mL/min   Anion gap 11 5 - 15    Comment: Performed at [facility], [place] [address], [place], [place] [address]  CBC WITH DIFFERENTIAL     Status: Abnormal   Collection Time: 01/27/18 12:04 AM  Result Value Ref Range   WBC 8.3 4.0 - 10.5 K/uL   RBC 7.09 (H) 4.22 - 5.81 MIL/uL   Hemoglobin 15.9 13.0 - 17.0 g/dL   HCT 53.0 (H) 39.0 - 52.0 %   MCV 74.8 (L) 80.0 - 100.0 fL   MCH 22.4 (L) 26.0 - 34.0 pg   MCHC 30.0 30.0 - 36.0 g/dL   RDW 18.2 (H) 11.5 - 15.5 %   Platelets 265 150 - 400 K/uL   nRBC 0.0 0.0 - 0.2 %   Neutrophils Relative % 78 %   Neutro Abs 6.4 1.7 - 7.7 K/uL   Lymphocytes Relative 15 %   Lymphs Abs 1.3 0.7 - 4.0 K/uL   Monocytes Relative 7 %   Monocytes  Absolute 0.6 0.1 - 1.0 K/uL   Eosinophils Relative 0 %   Eosinophils Absolute 0.0 0.0 - 0.5 K/uL   Basophils Relative 0 %   Basophils Absolute 0.0 0.0 - 0.1 K/uL   Immature Granulocytes 0 %   Abs Immature Granulocytes 0.02 0.00 - 0.07 K/uL    Comment: Performed at [facility], [place] [address] [place], [place] [address]  Protime-INR     Status: None   Collection Time: 01/27/18 12:04 AM  Result Value Ref Range   Prothrombin Time 12.8 11.4 - 15.2 seconds   INR 0.97     Comment: Performed at [facility], [place] [address] [place], [place] [address]  Lipase, blood     Status: None   Collection Time: 01/27/18 12:04 AM  Result Value Ref Range   Lipase 32 11 - 51 U/L    Comment: Performed at [facility], [place] [address], [place], [place] [address]  Troponin I - ONCE - STAT     Status: None   Collection Time: 01/27/18 12:04 AM  Result Value Ref Range   Troponin I <0.03 <0.03 ng/mL    Comment: Performed at [facility], [place] [address] [place], [place] [address]  Type and screen [place]     Status: None   Collection Time: 01/27/18 12:05 AM  Result Value Ref Range   ABO/RH(D) AB POS    Antibody Screen POS    Sample Expiration      01/30/2018 Performed at [facility], [place] [address], [place], [place] [address]   Lactic acid, plasma     Status: None   Collection Time: 01/27/18 12:05 AM  Result Value Ref Range   Lactic Acid, Venous 1.7 0.5 - 1.9 mmol/L    Comment: Performed at [facility], [place] [address], [place], [place] [address]   Ct Abdomen Pelvis W Contrast  Result Date: 01/27/2018 CLINICAL DATA:  Abdominal pain EXAM: CT ABDOMEN AND PELVIS WITH CONTRAST TECHNIQUE: Multidetector CT imaging of the abdomen and pelvis was performed using the standard protocol following bolus administration of intravenous contrast. CONTRAST:  156mL ISOVUE-300 IOPAMIDOL  (  ISOVUE-300) INJECTION 61% COMPARISON:  CT 03/29/2017 FINDINGS: Lower chest: Lung bases demonstrate mildly nodular left greater than right airspace disease. No pleural effusion. Heart size is normal. Distal esophageal thickening. Small focus of gas and fluid extending toward the extraluminal surface of the esophagus. No mediastinal air. Hepatobiliary: No focal liver abnormality is seen. No gallstones, gallbladder wall thickening, or biliary dilatation. Pancreas: Unremarkable. No pancreatic ductal dilatation or surrounding inflammatory changes. Spleen: Normal in size without focal abnormality. Adrenals/Urinary Tract: Adrenal glands are normal. No hydronephrosis. The bladder is normal Stomach/Bowel: The stomach is nonenlarged. Multiple dilated fluid-filled loops of small bowel up to 3 cm. Transition point related to a right bowel containing inguinal hernia. Diffuse diverticular disease of the colon. Vascular/Lymphatic: Extensive aortic atherosclerosis. No aneurysmal dilatation. Stable distal esophageal lymph nodes Reproductive: Massively enlarged prostate gland with mass effect on the bladder. Large bilateral scrotal hydroceles. Other: No free air or significant free fluid Musculoskeletal: Degenerative changes. No acute or suspicious abnormality. IMPRESSION: 1. Multiple loops of dilated small bowel, consistent with a bowel obstruction. Transition point is at a bowel containing right inguinal hernia. 2. Distal esophageal thickening suggesting esophagitis. Small gas and fluid collection extending into the mucosa of the distal esophagus, possibly representing a small mucosal tear. There is no pneumomediastinum to suggest a through and through perforation. 3. Nodular airspace disease in the left greater than right lung base which may reflect pneumonia or aspiration 4. Markedly enlarged prostate gland with mass effect on the bladder Electronically Signed   By: Donavan Foil M.D.   On: 01/27/2018 02:00   Dg Abdomen Acute  W/chest  Result Date: 01/27/2018 CLINICAL DATA:  Abdominal pain EXAM: DG ABDOMEN ACUTE W/ 1V CHEST COMPARISON:  07/05/2017, 10/19/2016, CT chest 05/02/2016 FINDINGS: Single-view chest demonstrates airspace disease at the left base. Normal heart size. Decubitus and supine views of the abdomen demonstrate a nonobstructed bowel-gas pattern. No free air. Residual contrast within the bowel. IMPRESSION: 1. Airspace disease at the left base may reflect atelectasis, pneumonia or aspiration 2. Nonobstructed bowel-gas pattern Electronically Signed   By: Donavan Foil M.D.   On: 01/27/2018 00:41    Pending Labs Unresulted Labs (From admission, onward)    Start     Ordered   01/27/18 0004  ABO/Rh  Once,   R     01/27/18 0004          Vitals/Pain Today's Vitals   01/26/18 2324 01/27/18 0227 01/27/18 0330  BP:  133/76 110/67  Pulse:  96 91  Resp:  20 14  SpO2: 93% 93% 92%    Isolation Precautions No active isolations  Medications Medications  sodium chloride 0.9 % bolus 1,000 mL (0 mLs Intravenous Stopped 01/27/18 0226)    And  0.9 %  sodium chloride infusion ( Intravenous New Bag/Given 01/27/18 0231)  iopamidol (ISOVUE-300) 61 % injection (has no administration in time range)  sodium chloride (PF) 0.9 % injection (has no administration in time range)  0.9 %  sodium chloride infusion (has no administration in time range)  pantoprazole (PROTONIX) injection 40 mg (40 mg Intravenous Given 01/27/18 0016)  ondansetron (ZOFRAN) injection 4 mg (4 mg Intravenous Given 01/27/18 0014)  iopamidol (ISOVUE-300) 61 % injection 100 mL (100 mLs Intravenous Contrast Given 01/27/18 0110)  HYDROmorphone (DILAUDID) injection 0.5 mg (0.5 mg Intravenous Given 01/27/18 0226)    Mobility non-ambulatory

## 2018-01-27 NOTE — Consult Note (Signed)
Reason for Consult:incarcerated right inguinal hernia Referring Physician: Dr. Annie Main Rancour  Jason [name] is an 73 y.o. male.  HPI: This is a gentleman with multiple chronic medical problems who was in a nursing facility with dementia.  He presented with a 2-day history of abdominal pain with emesis.  According to reports, he threw up a chicken bone.  Upon arrival, he was found to have an incarcerated right inguinal hernia.  This was confirmed to contain small bowel on a CT scan.  He is currently without complaints but does have dementia.  There is no family present.  Past Medical History:  Diagnosis Date  . Bladder mass 02-10-13   surgery planned for this  . COPD (chronic obstructive pulmonary disease) (HCC)    previous history and current smoking  . Dementia ([place]) 02/18/2013  . 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 Hypertension    States" never any meds for this"  . Right bundle branch block    history of this  . Stroke [facility])    Dementia, otherwise, no residual. 04-01-14 all resolved   . 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 BALLOON DILATION N/A 04/07/2014   Procedure: BALLOON DILATION;  Surgeon: Ardis Hughs, MD;  [facility];  Service: Urology;  Laterality: N/A;  . CYSTOSCOPY W/ RETROGRADES Bilateral 04/07/2014   Procedure: BILATERAL RETROGRADE PYELOGRAM;  Surgeon: Ardis Hughs, MD;  [facility];  Service: Urology;  Laterality: Bilateral;  . CYSTOSCOPY/RETROGRADE/URETEROSCOPY Bilateral 02/13/2013   Procedure: BILATERAL RETROGRADE;  Surgeon: Ardis Hughs, MD;  [facility];  Service: Urology;  Laterality: Bilateral;  . LAPAROTOMY N/A 02/18/2013   Procedure: EXPLORATORY LAPAROTOMY drainage of retroperitoneal abscess, drainage of Pre-Peritoneal abscess and Exploration of bladder perforation;  Surgeon: [place] Burn. Georgette Dover, MD;  Location: [place];  Service: General;  Laterality: N/A;   . TRANSURETHRAL RESECTION OF BLADDER TUMOR WITH GYRUS (TURBT-GYRUS) N/A 02/13/2013   Procedure: TRANSURETHRAL RESECTION OF BLADDER TUMOR WITH GYRUS (TURBT-GYRUS), bladder biopsies;  Surgeon: Ardis Hughs, MD;  [facility];  Service: Urology;  Laterality: N/A;    No family history on file.  Social History:  reports that he has been smoking cigarettes. He has been smoking about 1.00 pack per day. He has never used smokeless tobacco. He reports current alcohol use. He reports that he does not use drugs.  Allergies: No Known Allergies  Medications: I have reviewed the patient's current medications.  Results for orders placed or performed during the hospital encounter of 01/26/18 (from the past 48 hour(s))  POC occult blood, ED Provider will collect     Status: None   Collection Time: 01/26/18 11:41 PM  Result Value Ref Range   Fecal Occult Bld NEGATIVE NEGATIVE  Comprehensive metabolic panel     Status: Abnormal   Collection Time: 01/27/18 12:04 AM  Result Value Ref Range   Sodium 138 135 - 145 mmol/L   Potassium 3.9 3.5 - 5.1 mmol/L   Chloride 97 (L) 98 - 111 mmol/L   CO2 30 22 - 32 mmol/L   Glucose, Bld 163 (H) 70 - 99 mg/dL   BUN 13 8 - 23 mg/dL   Creatinine, Ser 1.17 0.61 - 1.24 mg/dL   Calcium 9.8 8.9 - 10.3 mg/dL   Total Protein 8.6 (H) 6.5 - 8.1 g/dL   Albumin 4.4 3.5 - 5.0 g/dL   AST 22 15 - 41 U/L   ALT  21 0 - 44 U/L   Alkaline Phosphatase 107 38 - 126 U/L   Total Bilirubin 0.5 0.3 - 1.2 mg/dL   GFR calc non Af Amer >60 >60 mL/min   GFR calc Af Amer >60 >60 mL/min   Anion gap 11 5 - 15    Comment: Performed at [facility], [place] [address], [place], [place] [address]  CBC WITH DIFFERENTIAL     Status: Abnormal   Collection Time: 01/27/18 12:04 AM  Result Value Ref Range   WBC 8.3 4.0 - 10.5 K/uL   RBC 7.09 (H) 4.22 - 5.81 MIL/uL   Hemoglobin 15.9 13.0 - 17.0 g/dL   HCT 53.0 (H) 39.0 - 52.0 %   MCV 74.8 (L) 80.0 - 100.0 fL   MCH 22.4 (L)  26.0 - 34.0 pg   MCHC 30.0 30.0 - 36.0 g/dL   RDW 18.2 (H) 11.5 - 15.5 %   Platelets 265 150 - 400 K/uL   nRBC 0.0 0.0 - 0.2 %   Neutrophils Relative % 78 %   Neutro Abs 6.4 1.7 - 7.7 K/uL   Lymphocytes Relative 15 %   Lymphs Abs 1.3 0.7 - 4.0 K/uL   Monocytes Relative 7 %   Monocytes Absolute 0.6 0.1 - 1.0 K/uL   Eosinophils Relative 0 %   Eosinophils Absolute 0.0 0.0 - 0.5 K/uL   Basophils Relative 0 %   Basophils Absolute 0.0 0.0 - 0.1 K/uL   Immature Granulocytes 0 %   Abs Immature Granulocytes 0.02 0.00 - 0.07 K/uL    Comment: Performed at [facility], [place] [address] [place], [place] [address]  Protime-INR     Status: None   Collection Time: 01/27/18 12:04 AM  Result Value Ref Range   Prothrombin Time 12.8 11.4 - 15.2 seconds   INR 0.97     Comment: Performed at [facility], [place] [address] [place], [place] [address]  Lipase, blood     Status: None   Collection Time: 01/27/18 12:04 AM  Result Value Ref Range   Lipase 32 11 - 51 U/L    Comment: Performed at [facility], [place] [address], [place], [place] [address]  Troponin I - ONCE - STAT     Status: None   Collection Time: 01/27/18 12:04 AM  Result Value Ref Range   Troponin I <0.03 <0.03 ng/mL    Comment: Performed at [facility], [place] [address] [place],  [address]  Type and screen [place]     Status: None   Collection Time: 01/27/18 12:05 AM  Result Value Ref Range   ABO/RH(D) AB POS    Antibody Screen POS    Sample Expiration      01/30/2018 Performed at [facility], [place] [address] [place], [place] [address]   Lactic acid, plasma     Status: None   Collection Time: 01/27/18 12:05 AM  Result Value Ref Range   Lactic Acid, Venous 1.7 0.5 - 1.9 mmol/L    Comment: Performed at [facility], [place] [address] [place],  [address]    Ct Abdomen Pelvis W  Contrast  Result Date: 01/27/2018 CLINICAL DATA:  Abdominal pain EXAM: CT ABDOMEN AND PELVIS WITH CONTRAST TECHNIQUE: Multidetector CT imaging of the abdomen and pelvis was performed using the standard protocol following bolus administration of intravenous contrast. CONTRAST:  192mL ISOVUE-300 IOPAMIDOL (ISOVUE-300) INJECTION 61% COMPARISON:  CT 03/29/2017 FINDINGS:  Lower chest: Lung bases demonstrate mildly nodular left greater than right airspace disease. No pleural effusion. Heart size is normal. Distal esophageal thickening. Small focus of gas and fluid extending toward the extraluminal surface of the esophagus. No mediastinal air. Hepatobiliary: No focal liver abnormality is seen. No gallstones, gallbladder wall thickening, or biliary dilatation. Pancreas: Unremarkable. No pancreatic ductal dilatation or surrounding inflammatory changes. Spleen: Normal in size without focal abnormality. Adrenals/Urinary Tract: Adrenal glands are normal. No hydronephrosis. The bladder is normal Stomach/Bowel: The stomach is nonenlarged. Multiple dilated fluid-filled loops of small bowel up to 3 cm. Transition point related to a right bowel containing inguinal hernia. Diffuse diverticular disease of the colon. Vascular/Lymphatic: Extensive aortic atherosclerosis. No aneurysmal dilatation. Stable distal esophageal lymph nodes Reproductive: Massively enlarged prostate gland with mass effect on the bladder. Large bilateral scrotal hydroceles. Other: No free air or significant free fluid Musculoskeletal: Degenerative changes. No acute or suspicious abnormality. IMPRESSION: 1. Multiple loops of dilated small bowel, consistent with a bowel obstruction. Transition point is at a bowel containing right inguinal hernia. 2. Distal esophageal thickening suggesting esophagitis. Small gas and fluid collection extending into the mucosa of the distal esophagus, possibly representing a small mucosal tear. There is no pneumomediastinum to  suggest a through and through perforation. 3. Nodular airspace disease in the left greater than right lung base which may reflect pneumonia or aspiration 4. Markedly enlarged prostate gland with mass effect on the bladder Electronically Signed   By: Donavan Foil M.D.   On: 01/27/2018 02:00   Dg Abdomen Acute W/chest  Result Date: 01/27/2018 CLINICAL DATA:  Abdominal pain EXAM: DG ABDOMEN ACUTE W/ 1V CHEST COMPARISON:  07/05/2017, 10/19/2016, CT chest 05/02/2016 FINDINGS: Single-view chest demonstrates airspace disease at the left base. Normal heart size. Decubitus and supine views of the abdomen demonstrate a nonobstructed bowel-gas pattern. No free air. Residual contrast within the bowel. IMPRESSION: 1. Airspace disease at the left base may reflect atelectasis, pneumonia or aspiration 2. Nonobstructed bowel-gas pattern Electronically Signed   By: Donavan Foil M.D.   On: 01/27/2018 00:41    Review of Systems  Unable to perform ROS: Dementia   Blood pressure 133/76, pulse 96, resp. rate 20, SpO2 93 %. Physical Exam  Constitutional: He appears well-developed and well-nourished. No distress.  HENT:  Head: Normocephalic and atraumatic.  Right Ear: External ear normal.  Left Ear: External ear normal.  Nose: Nose normal.  Mouth/Throat: Oropharynx is clear and moist. No oropharyngeal exudate.  Eyes: Pupils are equal, round, and reactive to light. Right eye exhibits no discharge. Left eye exhibits no discharge. No scleral icterus.  Neck: Normal range of motion. No tracheal deviation present.  Cardiovascular: Normal rate, regular rhythm, normal heart sounds and intact distal pulses.  No murmur heard. Respiratory: Effort normal and breath sounds normal. No respiratory distress.  GI: Soft. He exhibits no distension.  There was a tender, incarcerated, moderate to large right inguinal hernia.  There is a smaller left inguinal hernia.  I was able to completely reduce both hernias.   His abdomen is  nontender.  He has a well-healed lower midline incision.  Musculoskeletal: Normal range of motion.        General: No deformity or edema.  Lymphadenopathy:    He has no cervical adenopathy.  Neurological: He is alert.  He will follow commands and just say yes and no but has no further understanding past that  Skin: Skin is warm and dry. He is not diaphoretic. No erythema.  Assessment/Plan: Incarcerated right inguinal hernia creating bowel obstruction now completely reduced.  Again, I was able to completely reduce the hernia.  Given the CT finding in the esophagus, I would hold on nasogastric tube placement although I doubt he does have any full-thickness injury to the esophagus. From a surgical standpoint, he may have clear liquids.  No surgery is currently needed as I was able to reduce the hernia.  If he is able to tolerate p.o., he may be able to return to the facility later today.  We will continue to follow him while in the hospital.  Given his multiple comorbidities and medical issues, I recommend admission by the hospitalist service.  Coralie Keens 01/27/2018, 3:07 AM

## 2018-01-27 NOTE — ED Notes (Signed)
Report given to [facility]. 

## 2018-01-27 NOTE — Consult Note (Signed)
UNASSIGNED PATIENT Reason for Consult: ?CGE/abnormal CT scan./ Referring Physician: IMTS.  Jason [name] is an 73 y.o. male.  HPI: [name] is a 73 year old black male, with multiple medical problems listed below,  admitted to the hospital, after he was sent here from the nursing home,  with coffee-ground emesis noted to have an abnormal CT scan of the esophagus, with diffuse distal esophageal thickening consistent with esophagitis and a small glass gas and fluid collection extending to the mucosa versus esophagus representing a small mucosal tear with no evidence of pneumomediastinum or perforation nodular airspace disease was noted which may reflect aspiration versus pneumonia along with multiple lesions of dilated loops of small bowel ?SBO. A markedly enlarged prostate was also noted with mass effect on the bladder. As per my discussion with the patient's daughter there is some question the patient may have swallowed and then regurgitated a chicken bone when the symptoms first started.  Past Medical History:  Diagnosis Date  . Bladder mass 02-10-13   surgery planned for this  . COPD (chronic obstructive pulmonary disease) (HCC)    previous history and current smoking  . Dementia ([place]) 02/18/2013  . 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 Hypertension    States" never any meds for this"  . Right bundle branch block    history of this  . Stroke [facility])    Dementia, otherwise, no residual. 04-01-14 all resolved   . 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 BALLOON DILATION N/A 04/07/2014   Procedure: BALLOON DILATION;  Surgeon: Ardis Hughs, MD;  [facility];  Service: Urology;  Laterality: N/A;  . CYSTOSCOPY W/ RETROGRADES Bilateral 04/07/2014   Procedure: BILATERAL RETROGRADE PYELOGRAM;  Surgeon: Ardis Hughs, MD;  [facility];  Service: Urology;  Laterality: Bilateral;  .  CYSTOSCOPY/RETROGRADE/URETEROSCOPY Bilateral 02/13/2013   Procedure: BILATERAL RETROGRADE;  Surgeon: Ardis Hughs, MD;  [facility];  Service: Urology;  Laterality: Bilateral;  . LAPAROTOMY N/A 02/18/2013   Procedure: EXPLORATORY LAPAROTOMY drainage of retroperitoneal abscess, drainage of Pre-Peritoneal abscess and Exploration of bladder perforation;  Surgeon: [place] Burn. Georgette Dover, MD;  Location: [place];  Service: General;  Laterality: N/A;  . TRANSURETHRAL RESECTION OF BLADDER TUMOR WITH GYRUS (TURBT-GYRUS) N/A 02/13/2013   Procedure: TRANSURETHRAL RESECTION OF BLADDER TUMOR WITH GYRUS (TURBT-GYRUS), bladder biopsies;  Surgeon: Ardis Hughs, MD;  [facility];  Service: Urology;  Laterality: N/A;   No family history on file.  Social History:  reports that he has been smoking cigarettes. He has been smoking about 1.00 pack per day. He has never used smokeless tobacco. He reports current alcohol use. He reports that he does not use drugs.  Allergies: No Known Allergies  Medications: I have reviewed the patient's current medications.  Results for orders placed or performed during the hospital encounter of 01/26/18 (from the past 48 hour(s))  POC occult blood, ED Provider will collect     Status: None   Collection Time: 01/26/18 11:41 PM  Result Value Ref Range   Fecal Occult Bld NEGATIVE NEGATIVE  Comprehensive metabolic panel     Status: Abnormal   Collection Time: 01/27/18 12:04 AM  Result Value Ref Range   Sodium 138 135 - 145 mmol/L   Potassium 3.9 3.5 - 5.1 mmol/L   Chloride 97 (L) 98 - 111 mmol/L   CO2 30 22 - 32 mmol/L   Glucose, Bld 163 (H) 70 -  99 mg/dL   BUN 13 8 - 23 mg/dL   Creatinine, Ser 1.17 0.61 - 1.24 mg/dL   Calcium 9.8 8.9 - 10.3 mg/dL   Total Protein 8.6 (H) 6.5 - 8.1 g/dL   Albumin 4.4 3.5 - 5.0 g/dL   AST 22 15 - 41 U/L   ALT 21 0 - 44 U/L   Alkaline Phosphatase 107 38 - 126 U/L   Total Bilirubin 0.5 0.3 - 1.2 mg/dL   GFR calc non Af Amer >60 >60  mL/min   GFR calc Af Amer >60 >60 mL/min   Anion gap 11 5 - 15    Comment: Performed at [facility], [place] [address] [place], [place] [address]  CBC WITH DIFFERENTIAL     Status: Abnormal   Collection Time: 01/27/18 12:04 AM  Result Value Ref Range   WBC 8.3 4.0 - 10.5 K/uL   RBC 7.09 (H) 4.22 - 5.81 MIL/uL   Hemoglobin 15.9 13.0 - 17.0 g/dL   HCT 53.0 (H) 39.0 - 52.0 %   MCV 74.8 (L) 80.0 - 100.0 fL   MCH 22.4 (L) 26.0 - 34.0 pg   MCHC 30.0 30.0 - 36.0 g/dL   RDW 18.2 (H) 11.5 - 15.5 %   Platelets 265 150 - 400 K/uL   nRBC 0.0 0.0 - 0.2 %   Neutrophils Relative % 78 %   Neutro Abs 6.4 1.7 - 7.7 K/uL   Lymphocytes Relative 15 %   Lymphs Abs 1.3 0.7 - 4.0 K/uL   Monocytes Relative 7 %   Monocytes Absolute 0.6 0.1 - 1.0 K/uL   Eosinophils Relative 0 %   Eosinophils Absolute 0.0 0.0 - 0.5 K/uL   Basophils Relative 0 %   Basophils Absolute 0.0 0.0 - 0.1 K/uL   Immature Granulocytes 0 %   Abs Immature Granulocytes 0.02 0.00 - 0.07 K/uL    Comment: Performed at [facility], [place] [address] [place], [place] [address]  Protime-INR     Status: None   Collection Time: 01/27/18 12:04 AM  Result Value Ref Range   Prothrombin Time 12.8 11.4 - 15.2 seconds   INR 0.97     Comment: Performed at [facility], [place] [address], [place], [place] [address]  Lipase, blood     Status: None   Collection Time: 01/27/18 12:04 AM  Result Value Ref Range   Lipase 32 11 - 51 U/L    Comment: Performed at [facility], [place] [address] [place], [place] [address]  Troponin I - ONCE - STAT     Status: None   Collection Time: 01/27/18 12:04 AM  Result Value Ref Range   Troponin I <0.03 <0.03 ng/mL    Comment: Performed at [facility], [place] [address], [place], [place] [address]  Type and screen [place]     Status: None   Collection Time: 01/27/18 12:05 AM  Result Value Ref Range    ABO/RH(D) AB POS    Antibody Screen POS    Sample Expiration 01/30/2018    Antibody Identification NO CLINICALLY SIGNIFICANT ANTIBODY IDENTIFIED    DAT, IgG      NEG Performed at [place] [address] [place],  [address]   Lactic acid, plasma     Status: None   Collection Time: 01/27/18 12:05 AM  Result Value Ref Range   Lactic Acid, Venous 1.7 0.5 - 1.9 mmol/L  Comment: Performed at [facility], [place] [address], [place], [place] [address]    Ct Abdomen Pelvis W Contrast  Result Date: 01/27/2018 CLINICAL DATA:  Abdominal pain EXAM: CT ABDOMEN AND PELVIS WITH CONTRAST TECHNIQUE: Multidetector CT imaging of the abdomen and pelvis was performed using the standard protocol following bolus administration of intravenous contrast. CONTRAST:  163mL ISOVUE-300 IOPAMIDOL (ISOVUE-300) INJECTION 61% COMPARISON:  CT 03/29/2017 FINDINGS: Lower chest: Lung bases demonstrate mildly nodular left greater than right airspace disease. No pleural effusion. Heart size is normal. Distal esophageal thickening. Small focus of gas and fluid extending toward the extraluminal surface of the esophagus. No mediastinal air. Hepatobiliary: No focal liver abnormality is seen. No gallstones, gallbladder wall thickening, or biliary dilatation. Pancreas: Unremarkable. No pancreatic ductal dilatation or surrounding inflammatory changes. Spleen: Normal in size without focal abnormality. Adrenals/Urinary Tract: Adrenal glands are normal. No hydronephrosis. The bladder is normal Stomach/Bowel: The stomach is nonenlarged. Multiple dilated fluid-filled loops of small bowel up to 3 cm. Transition point related to a right bowel containing inguinal hernia. Diffuse diverticular disease of the colon. Vascular/Lymphatic: Extensive aortic atherosclerosis. No aneurysmal dilatation. Stable distal esophageal lymph nodes Reproductive: Massively enlarged prostate gland with mass effect on the  bladder. Large bilateral scrotal hydroceles. Other: No free air or significant free fluid Musculoskeletal: Degenerative changes. No acute or suspicious abnormality. IMPRESSION: 1. Multiple loops of dilated small bowel, consistent with a bowel obstruction. Transition point is at a bowel containing right inguinal hernia. 2. Distal esophageal thickening suggesting esophagitis. Small gas and fluid collection extending into the mucosa of the distal esophagus, possibly representing a small mucosal tear. There is no pneumomediastinum to suggest a through and through perforation. 3. Nodular airspace disease in the left greater than right lung base which may reflect pneumonia or aspiration 4. Markedly enlarged prostate gland with mass effect on the bladder Electronically Signed   By: Donavan Foil M.D.   On: 01/27/2018 02:00   Dg Abdomen Acute W/chest  Result Date: 01/27/2018 CLINICAL DATA:  Abdominal pain EXAM: DG ABDOMEN ACUTE W/ 1V CHEST COMPARISON:  07/05/2017, 10/19/2016, CT chest 05/02/2016 FINDINGS: Single-view chest demonstrates airspace disease at the left base. Normal heart size. Decubitus and supine views of the abdomen demonstrate a nonobstructed bowel-gas pattern. No free air. Residual contrast within the bowel. IMPRESSION: 1. Airspace disease at the left base may reflect atelectasis, pneumonia or aspiration 2. Nonobstructed bowel-gas pattern Electronically Signed   By: Donavan Foil M.D.   On: 01/27/2018 00:41   Review of Systems  Unable to perform ROS: Dementia   Blood pressure 131/68, pulse 70, temperature 97.6 F (36.4 C), temperature source Oral, resp. rate 16, SpO2 94 %. Physical Exam  Constitutional: He appears well-developed and well-nourished.  HENT:  Head: Normocephalic and atraumatic.  Eyes: Conjunctivae and EOM are normal.  Neck: Normal range of motion. Neck supple.  Cardiovascular: Normal rate and regular rhythm.  Respiratory: Effort normal and breath sounds normal.  GI: Soft.  Bowel sounds are normal.  Skin: Skin is warm and dry.  Psychiatric: His behavior is normal.   Assessment/Plan: 1) Acute esophagitis/GERD with a mucosal tear/abnormal CT scan of the abdomen-patient needs to be treated aggressively with PPIs and the site of protective agents and maintain a low residue soft diet for the next week. I do not feel he needs any endoscopic intervention at this time. 2) Small bowel obstruction with incarcerated inguinal hernia, both on the left and right  that has been reduced by Dr. Nathaneil Canary  [place]. the surgeons Lakashia Collison 01/27/2018, 3:23 PM

## 2018-01-28 LAB — BASIC METABOLIC PANEL
Anion gap: 9 (ref 5–15)
BUN: 9 mg/dL (ref 8–23)
CO2: 25 mmol/L (ref 22–32)
Calcium: 8.1 mg/dL — ABNORMAL LOW (ref 8.9–10.3)
Chloride: 105 mmol/L (ref 98–111)
Creatinine, Ser: 0.92 mg/dL (ref 0.61–1.24)
GFR calc Af Amer: >60 mL/min (ref >60)
GFR calc non Af Amer: >60 mL/min (ref >60)
Glucose, Bld: 106 mg/dL — ABNORMAL HIGH (ref 70–99)
Potassium: 4.4 mmol/L (ref 3.5–5.1)
Sodium: 139 mmol/L (ref 135–145)

## 2018-01-28 LAB — CBC
HCT: 46.8 % (ref 39.0–52.0)
Hemoglobin: 13.8 g/dL (ref 13.0–17.0)
MCH: 21.9 pg — ABNORMAL LOW (ref 26.0–34.0)
MCHC: 29.5 g/dL — ABNORMAL LOW (ref 30.0–36.0)
MCV: 74.4 fL — ABNORMAL LOW (ref 80.0–100.0)
Platelets: 196 10*3/uL (ref 150–400)
RBC: 6.29 MIL/uL — ABNORMAL HIGH (ref 4.22–5.81)
RDW: 17.7 % — ABNORMAL HIGH (ref 11.5–15.5)
WBC: 6.6 10*3/uL (ref 4.0–10.5)
nRBC: 0 % (ref 0.0–0.2)

## 2018-01-28 NOTE — Progress Notes (Signed)
Patient ID: Jason [name], male   DOB: 1945-11-07, 73 y.o.   MRN: [id number]       Subjective: CC: right inguinal hernia Pleasant. Denies abdominal pain or pain over right inguinal hernia. No N/V. Tolerating FLD. He is unsure of flatus or BM.    Objective: Vital signs in last 24 hours: Temp:  [97.6 F (36.4 C)-98.4 F (36.9 C)] 98.3 F (36.8 C) (01/28 0303) Pulse Rate:  [63-76] 76 (01/28 0303) Resp:  [16-18] 18 (01/28 0303) BP: (118-131)/(63-70) 131/63 (01/28 0303) SpO2:  [94 %-96 %] 96 % (01/28 0303) Weight:  [74.7 kg] 74.7 kg (01/27 1444) Last BM Date: 01/26/18  Intake/Output from previous day: 01/27 0701 - 01/28 0700 In: 1980.4 [P.O.:120; I.V.:1860.4] Out: 700 [Urine:700] Intake/Output this shift: No intake/output data recorded.  PE: Gen: WD, WN Heart: RRR Lungs: CTA b/l Abd: Soft, ND, NT. +BS. Reduced right and left inguinal hernia without tenderness.  Lab Results:  Recent Labs    01/27/18 0004 01/28/18 0211  WBC 8.3 6.6  HGB 15.9 13.8  HCT 53.0* 46.8  PLT 265 196   BMET Recent Labs    01/27/18 0004 01/28/18 0211  NA 138 139  K 3.9 4.4  CL 97* 105  CO2 30 25  GLUCOSE 163* 106*  BUN 13 9  CREATININE 1.17 0.92  CALCIUM 9.8 8.1*   PT/INR Recent Labs    01/27/18 0004  LABPROT 12.8  INR 0.97   CMP     Component Value Date/Time   NA 139 01/28/2018 0211   K 4.4 01/28/2018 0211   CL 105 01/28/2018 0211   CO2 25 01/28/2018 0211   GLUCOSE 106 (H) 01/28/2018 0211   BUN 9 01/28/2018 0211   CREATININE 0.92 01/28/2018 0211   CALCIUM 8.1 (L) 01/28/2018 0211   PROT 8.6 (H) 01/27/2018 0004   ALBUMIN 4.4 01/27/2018 0004   AST 22 01/27/2018 0004   ALT 21 01/27/2018 0004   ALKPHOS 107 01/27/2018 0004   BILITOT 0.5 01/27/2018 0004   GFRNONAA >60 01/28/2018 0211   GFRAA >60 01/28/2018 0211   Lipase     Component Value Date/Time   LIPASE 32 01/27/2018 0004       Studies/Results: Ct Abdomen Pelvis W Contrast  Result Date: 01/27/2018 CLINICAL  DATA:  Abdominal pain EXAM: CT ABDOMEN AND PELVIS WITH CONTRAST TECHNIQUE: Multidetector CT imaging of the abdomen and pelvis was performed using the standard protocol following bolus administration of intravenous contrast. CONTRAST:  158mL ISOVUE-300 IOPAMIDOL (ISOVUE-300) INJECTION 61% COMPARISON:  CT 03/29/2017 FINDINGS: Lower chest: Lung bases demonstrate mildly nodular left greater than right airspace disease. No pleural effusion. Heart size is normal. Distal esophageal thickening. Small focus of gas and fluid extending toward the extraluminal surface of the esophagus. No mediastinal air. Hepatobiliary: No focal liver abnormality is seen. No gallstones, gallbladder wall thickening, or biliary dilatation. Pancreas: Unremarkable. No pancreatic ductal dilatation or surrounding inflammatory changes. Spleen: Normal in size without focal abnormality. Adrenals/Urinary Tract: Adrenal glands are normal. No hydronephrosis. The bladder is normal Stomach/Bowel: The stomach is nonenlarged. Multiple dilated fluid-filled loops of small bowel up to 3 cm. Transition point related to a right bowel containing inguinal hernia. Diffuse diverticular disease of the colon. Vascular/Lymphatic: Extensive aortic atherosclerosis. No aneurysmal dilatation. Stable distal esophageal lymph nodes Reproductive: Massively enlarged prostate gland with mass effect on the bladder. Large bilateral scrotal hydroceles. Other: No free air or significant free fluid Musculoskeletal: Degenerative changes. No acute or suspicious abnormality. IMPRESSION: 1. Multiple loops of dilated  small bowel, consistent with a bowel obstruction. Transition point is at a bowel containing right inguinal hernia. 2. Distal esophageal thickening suggesting esophagitis. Small gas and fluid collection extending into the mucosa of the distal esophagus, possibly representing a small mucosal tear. There is no pneumomediastinum to suggest a through and through perforation. 3.  Nodular airspace disease in the left greater than right lung base which may reflect pneumonia or aspiration 4. Markedly enlarged prostate gland with mass effect on the bladder Electronically Signed   By: Donavan Foil M.D.   On: 01/27/2018 02:00   Dg Abdomen Acute W/chest  Result Date: 01/27/2018 CLINICAL DATA:  Abdominal pain EXAM: DG ABDOMEN ACUTE W/ 1V CHEST COMPARISON:  07/05/2017, 10/19/2016, CT chest 05/02/2016 FINDINGS: Single-view chest demonstrates airspace disease at the left base. Normal heart size. Decubitus and supine views of the abdomen demonstrate a nonobstructed bowel-gas pattern. No free air. Residual contrast within the bowel. IMPRESSION: 1. Airspace disease at the left base may reflect atelectasis, pneumonia or aspiration 2. Nonobstructed bowel-gas pattern Electronically Signed   By: Donavan Foil M.D.   On: 01/27/2018 00:41    Anti-infectives: Anti-infectives (From admission, onward)   None       Assessment/Plan HTN Dementia COPD CVA  Incarcerated right inguinal hernia creating bowel obstruction now completely reduced -Reduced in the ED by Dr. Ninfa Linden on 1/27 -Advance diet to soft -Will plan for surgery on Thursday pending consent from patients POA -Mobilize and IS   FEN - Soft VTE - SCD ID - None     LOS: 1 day    Jillyn Ledger , [facility] 01/28/2018, 9:15 AM Pager: [phone]

## 2018-01-28 NOTE — Plan of Care (Signed)
  Problem: Activity: Goal: Risk for activity intolerance will decrease Outcome: Progressing   Problem: Nutrition: Goal: Adequate nutrition will be maintained Outcome: Progressing   

## 2018-01-28 NOTE — Progress Notes (Signed)
   Subjective: Patient reported feeling well today. Denies any pain. Denies having a bowel movement. He has severe dementia so interview was limited.   Objective:  Vital signs in last 24 hours: Vitals:   01/27/18 0655 01/27/18 1444 01/27/18 2124 01/28/18 0303  BP: (!) 117/57 131/68 118/70 131/63  Pulse: 63 70 63 76  Resp: 16 16 18 18   Temp: 98.1 F (36.7 C) 97.6 F (36.4 C) 98.4 F (36.9 C) 98.3 F (36.8 C)  TempSrc: Oral Oral Oral Oral  SpO2: 92% 94% 95% 96%  Weight:  74.7 kg    Height:  5\' 8"  (1.727 m)     Gen: seen comfortably laying in bed, no distress CV: RRR Pulm: ctab, no distress Genitals: inguinal hernia was not appreciated, no swelling or tenderness, + cremaster reflex   Assessment/Plan:  Principal Problem:   Small bowel obstruction (HCC) Active Problems:   Dementia (HCC)   Inguinal hernia   Acute esophagitis   Hematemesis  [name] is a 73yo male PACE patient with dementia, copd, bladder cancer s/p resection who presents from a nursing facility with abdominal pain and coffee-ground emesis. Found to have SBO 2/2 incarcerated inguinal hernia which has now been successfully reduced.   SBO 2/2 Incarcerate Right Inguinal Hernia - Successfully reduced in the ED.  - Surgery following and planning for surgery on Thursday pending consent from [place] , appreciate recommendations - Regular diet  Coffee ground emesis, esophagitis - CT abdomen showed distal esophageal thickening suggesting esophagitis. Small gas and fluid collection extending into the mucosa of the distal esophagus, possibly representing a small mucosal tear. There is no pneumomediastinum to suggest a through and through perforation. - Will continue to monitor  - No more emesis since admission - Hgb decreased to 13.8 from 15.9, most likely dilutional as all cell lines decreased  - Hemodynamically stable - IV PPI - Phenergan prn  - Avoid NG tube for now given esophagitis - GI on board,  appreciate recommendations   Dispo: Anticipated discharge pending inguinal hernia surgical repair.  Gypsy Decant, Medical Student 01/28/2018, 11:00 AM

## 2018-01-28 NOTE — Progress Notes (Signed)
  Date: 01/28/2018  Patient name: Jason [name]  Medical record number: [id number]  Date of birth: [birth year]   I have seen and evaluated this patient and I have discussed the plan of care with the house staff. Please see their note for complete details. I concur with their findings with the following additions/corrections:   73 year old man with advanced dementia admitted for abdominal pain and hematemesis due to incarcerated inguinal hernia.  Appreciate surgical consultation, they have reduced the hernia and are planning surgical repair of the hernia on Thursday to prevent recurrence.  In the meantime, he seems to be doing well on a full liquid diet with no further vomiting.  Appreciate GI consultation, who provided reassurance that he does not need endoscopic evaluation at this time.  Lenice Pressman, M.D., Ph.D. 01/28/2018, 1:39 PM

## 2018-01-29 LAB — CBC
HCT: 42.4 % (ref 39.0–52.0)
Hemoglobin: 12.9 g/dL — ABNORMAL LOW (ref 13.0–17.0)
MCH: 22 pg — ABNORMAL LOW (ref 26.0–34.0)
MCHC: 30.4 g/dL (ref 30.0–36.0)
MCV: 72.4 fL — ABNORMAL LOW (ref 80.0–100.0)
Platelets: 197 10*3/uL (ref 150–400)
RBC: 5.86 MIL/uL — ABNORMAL HIGH (ref 4.22–5.81)
RDW: 15.8 % — ABNORMAL HIGH (ref 11.5–15.5)
WBC: 5.8 10*3/uL (ref 4.0–10.5)
nRBC: 0 % (ref 0.0–0.2)

## 2018-01-29 LAB — SURGICAL PCR SCREEN
MRSA, PCR: NEGATIVE
Staphylococcus aureus: NEGATIVE

## 2018-01-29 MED ORDER — GABAPENTIN 300 MG PO CAPS: 300.0000 mg | ORAL_CAPSULE | ORAL | Status: DC

## 2018-01-29 MED ORDER — ACETAMINOPHEN 500 MG PO TABS: 1000.0000 mg | ORAL_TABLET | ORAL | Status: DC

## 2018-01-29 MED ORDER — CEFAZOLIN SODIUM-DEXTROSE 2-4 GM/100ML-% IV SOLN
2.0000 g | INTRAVENOUS | Status: AC
  Administered 2018-01-30: 2 g via INTRAVENOUS
  Filled 2018-01-29 (×2): qty 100

## 2018-01-29 MED ORDER — CHLORHEXIDINE GLUCONATE CLOTH 2 % EX PADS
6.0000 | MEDICATED_PAD | Freq: Once | CUTANEOUS | Status: AC
  Administered 2018-01-30: 6 via TOPICAL

## 2018-01-29 MED ORDER — CHLORHEXIDINE GLUCONATE CLOTH 2 % EX PADS
6.0000 | MEDICATED_PAD | Freq: Once | CUTANEOUS | Status: AC
  Administered 2018-01-29: 6 via TOPICAL

## 2018-01-29 NOTE — Progress Notes (Signed)
   Subjective: Pleasant this morning, eating breakfast.  Denies abdominal pain, nausea, and vomiting, although patient provides limited history due to dementia.  Objective:  Vital signs in last 24 hours: Vitals:   01/28/18 1307 01/28/18 2050 01/29/18 0706 01/29/18 0953  BP: (!) 122/57 130/72 (!) 142/75 133/62  Pulse: 69 67 67 83  Resp: 16 (!) 24 17 16   Temp: 99.6 F (37.6 C) 97.7 F (36.5 C) 98.3 F (36.8 C) (!) 97.5 F (36.4 C)  TempSrc: Oral Oral Oral Oral  SpO2: 92% 99% 95% 95%  Weight:      Height:       Gen: seen comfortably laying in bed, no distress CV: RRR, no murmurs Pulm: ctab, no distress Abd: bowel sounds present, soft, non-tender, no masses or organomegaly Genitals: bulge at right groin is soft and non-tender  Assessment/Plan:  Principal Problem:   Small bowel obstruction (HCC) Active Problems:   Dementia (HCC)   Inguinal hernia   Acute esophagitis   Hematemesis  Jason [name] is a 73yo male PACE patient with dementia, copd, bladder cancer s/p resection who presents from a nursing facility with abdominal pain and coffee-ground emesis. Found to have SBO 2/2 incarcerated inguinal hernia which has now been successfully reduced.   SBO 2/2 Incarcerate Right Inguinal Hernia - Successfully reduced in the ED - Surgery following and planning for surgery on Thursday, appreciate recommendations - Advance to regular diet today  Coffee ground emesis, esophagitis - CT abdomen showed distal esophageal thickening suggesting esophagitis. Small gas and fluid collection extending into the mucosa of the distal esophagus, possibly representing a small mucosal tear. There is no pneumomediastinum to suggest a through and through perforation. - Will continue to monitor  - No more emesis since admission - Hgb decreased to 12.9 from 15.9 on admission, most likely dilutional as all cell lines decreased  - Hemodynamically stable - IV PPI - Phenergan prn  - Avoid NG tube for  now given esophagitis - GI on board, appreciate recommendations  Dispo: Anticipated discharge pending inguinal hernia surgical repair.  Gypsy Decant, Medical Student 01/29/2018, 10:33 AM  Attestation for Student Documentation:   I personally was present and performed or re-performed the history, physical exam and medical decision-making activities of this service and have verified that the service and findings are accurately documented in the student's note.  Maurita Havener N, DO 01/29/2018, 11:40 AM

## 2018-01-29 NOTE — Progress Notes (Signed)
Patient ID: Jason [name], male   DOB: 1945-03-04, 73 y.o.   MRN: [id number]       Subjective: CC: Right inguinal hernia Pleasant. Denies abdominal pain or pain over the right inguinal hernia. No N/V. Tolerating diet. Daughter is at bedside.  Objective: Vital signs in last 24 hours: Temp:  [97.7 F (36.5 C)-99.6 F (37.6 C)] 98.3 F (36.8 C) (01/29 0706) Pulse Rate:  [67-69] 67 (01/29 0706) Resp:  [16-24] 17 (01/29 0706) BP: (122-142)/(57-75) 142/75 (01/29 0706) SpO2:  [92 %-99 %] 95 % (01/29 0706) Last BM Date: 01/26/18  Intake/Output from previous day: 01/28 0701 - 01/29 0700 In: 480 [P.O.:480] Out: -  Intake/Output this shift: Total I/O In: 240 [P.O.:240] Out: -   PE: Gen: WD, WN Heart: RRR Lungs: CTA b/l Abd: Soft, ND, NT. +BS. Reduced right and left inguinal hernia without tenderness.  Lab Results:  Recent Labs    01/28/18 0211 01/29/18 0322  WBC 6.6 5.8  HGB 13.8 12.9*  HCT 46.8 42.4  PLT 196 197   BMET Recent Labs    01/27/18 0004 01/28/18 0211  NA 138 139  K 3.9 4.4  CL 97* 105  CO2 30 25  GLUCOSE 163* 106*  BUN 13 9  CREATININE 1.17 0.92  CALCIUM 9.8 8.1*   PT/INR Recent Labs    01/27/18 0004  LABPROT 12.8  INR 0.97   CMP     Component Value Date/Time   NA 139 01/28/2018 0211   K 4.4 01/28/2018 0211   CL 105 01/28/2018 0211   CO2 25 01/28/2018 0211   GLUCOSE 106 (H) 01/28/2018 0211   BUN 9 01/28/2018 0211   CREATININE 0.92 01/28/2018 0211   CALCIUM 8.1 (L) 01/28/2018 0211   PROT 8.6 (H) 01/27/2018 0004   ALBUMIN 4.4 01/27/2018 0004   AST 22 01/27/2018 0004   ALT 21 01/27/2018 0004   ALKPHOS 107 01/27/2018 0004   BILITOT 0.5 01/27/2018 0004   GFRNONAA >60 01/28/2018 0211   GFRAA >60 01/28/2018 0211   Lipase     Component Value Date/Time   LIPASE 32 01/27/2018 0004       Studies/Results: No results found.  Anti-infectives: Anti-infectives (From admission, onward)   None        Assessment/Plan HTN Dementia COPD CVA  Incarcerated right inguinal hernia creating bowel obstruction now completely reduced - Reduced in the ED by Dr. Ninfa Linden on 1/27 - Tolerating diet - Discussed with daughter at bedside risk vs benefit of surgery - Scheduled for surgery on 1/30 - Mobilize and IS   FEN - Soft, NPO after midnight  VTE - SCD ID - Ancef periop     LOS: 2 days    Jillyn Ledger , [facility] 01/29/2018, 9:50 AM Pager: [phone]

## 2018-01-29 NOTE — Progress Notes (Signed)
  Date: 01/29/2018  Patient name: Jason [name]  Medical record number: [id number]  Date of birth: July 19, 1945   I have seen and evaluated this patient and I have discussed the plan of care with the house staff. Please see their note for complete details. I concur with their findings with the following additions/corrections:   Seems happy and interactive today.  Still unable to provide any meaningful history or report of events for the past 24 hours.  Inguinal hernia is much more prominent in the groin today.  Still tolerating a diet well, appreciate surgery consultation and plan for hernia repair tomorrow to prevent recurrence.  Lenice Pressman, M.D., Ph.D. 01/29/2018, 2:43 PM

## 2018-01-29 NOTE — Plan of Care (Signed)
  Problem: Nutrition: ?Goal: Adequate nutrition will be maintained ?Outcome: Progressing ?  ?Problem: Coping: ?Goal: Level of anxiety will decrease ?Outcome: Progressing ?  ?Problem: Elimination: ?Goal: Will not experience complications related to bowel motility ?Outcome: Progressing ?  ?Problem: Safety: ?Goal: Ability to remain free from injury will improve ?Outcome: Progressing ?  ?Problem: Skin Integrity: ?Goal: Risk for impaired skin integrity will decrease ?Outcome: Progressing ?  ?

## 2018-01-29 NOTE — H&P (View-Only) (Signed)
Patient ID: Jason [name], male   DOB: 06-07-1945, 73 y.o.   MRN: [id number]       Subjective: CC: Right inguinal hernia Pleasant. Denies abdominal pain or pain over the right inguinal hernia. No N/V. Tolerating diet. Daughter is at bedside.  Objective: Vital signs in last 24 hours: Temp:  [97.7 F (36.5 C)-99.6 F (37.6 C)] 98.3 F (36.8 C) (01/29 0706) Pulse Rate:  [67-69] 67 (01/29 0706) Resp:  [16-24] 17 (01/29 0706) BP: (122-142)/(57-75) 142/75 (01/29 0706) SpO2:  [92 %-99 %] 95 % (01/29 0706) Last BM Date: 01/26/18  Intake/Output from previous day: 01/28 0701 - 01/29 0700 In: 480 [P.O.:480] Out: -  Intake/Output this shift: Total I/O In: 240 [P.O.:240] Out: -   PE: Gen: WD, WN Heart: RRR Lungs: CTA b/l Abd: Soft, ND, NT. +BS. Reduced right and left inguinal hernia without tenderness.  Lab Results:  Recent Labs    01/28/18 0211 01/29/18 0322  WBC 6.6 5.8  HGB 13.8 12.9*  HCT 46.8 42.4  PLT 196 197   BMET Recent Labs    01/27/18 0004 01/28/18 0211  NA 138 139  K 3.9 4.4  CL 97* 105  CO2 30 25  GLUCOSE 163* 106*  BUN 13 9  CREATININE 1.17 0.92  CALCIUM 9.8 8.1*   PT/INR Recent Labs    01/27/18 0004  LABPROT 12.8  INR 0.97   CMP     Component Value Date/Time   NA 139 01/28/2018 0211   K 4.4 01/28/2018 0211   CL 105 01/28/2018 0211   CO2 25 01/28/2018 0211   GLUCOSE 106 (H) 01/28/2018 0211   BUN 9 01/28/2018 0211   CREATININE 0.92 01/28/2018 0211   CALCIUM 8.1 (L) 01/28/2018 0211   PROT 8.6 (H) 01/27/2018 0004   ALBUMIN 4.4 01/27/2018 0004   AST 22 01/27/2018 0004   ALT 21 01/27/2018 0004   ALKPHOS 107 01/27/2018 0004   BILITOT 0.5 01/27/2018 0004   GFRNONAA >60 01/28/2018 0211   GFRAA >60 01/28/2018 0211   Lipase     Component Value Date/Time   LIPASE 32 01/27/2018 0004       Studies/Results: No results found.  Anti-infectives: Anti-infectives (From admission, onward)   None        Assessment/Plan HTN Dementia COPD CVA  Incarcerated right inguinal hernia creating bowel obstruction now completely reduced - Reduced in the ED by Dr. Ninfa Linden on 1/27 - Tolerating diet - Discussed with daughter at bedside risk vs benefit of surgery - Scheduled for surgery on 1/30 - Mobilize and IS   FEN - Soft, NPO after midnight  VTE - SCD ID - Ancef periop     LOS: 2 days    Jillyn Ledger , [facility] 01/29/2018, 9:50 AM Pager: [phone]

## 2018-01-30 ENCOUNTER — Encounter (HOSPITAL_COMMUNITY): Payer: Self-pay

## 2018-01-30 ENCOUNTER — Encounter (HOSPITAL_COMMUNITY)
Admission: EM | Disposition: A | Discharge status: Skilled Nursing Facility | Payer: Self-pay | Source: Skilled Nursing Facility | Attending: Internal Medicine

## 2018-01-30 ENCOUNTER — Inpatient Hospital Stay (HOSPITAL_COMMUNITY): Payer: Medicare (Managed Care) | Admitting: Anesthesiology

## 2018-01-30 DIAGNOSIS — L8990 Pressure ulcer of unspecified site, unspecified stage: Secondary | ICD-10-CM | POA: Diagnosis present

## 2018-01-30 HISTORY — PX: INGUINAL HERNIA REPAIR: SHX194

## 2018-01-30 SURGERY — REPAIR, HERNIA, INGUINAL, ADULT
Anesthesia: General | Laterality: Right

## 2018-01-30 MED ORDER — PHENYLEPHRINE 40 MCG/ML (10ML) SYRINGE FOR IV PUSH (FOR BLOOD PRESSURE SUPPORT)
PREFILLED_SYRINGE | INTRAVENOUS | Status: DC | PRN
  Administered 2018-01-30: 160 ug via INTRAVENOUS
  Administered 2018-01-30: 120 ug via INTRAVENOUS

## 2018-01-30 MED ORDER — ROCURONIUM BROMIDE 50 MG/5ML IV SOSY
PREFILLED_SYRINGE | INTRAVENOUS | Status: DC | PRN
  Administered 2018-01-30: 50 mg via INTRAVENOUS

## 2018-01-30 MED ORDER — PROPOFOL 10 MG/ML IV BOLUS
INTRAVENOUS | Status: AC
  Filled 2018-01-30: qty 20

## 2018-01-30 MED ORDER — DEXAMETHASONE SODIUM PHOSPHATE 10 MG/ML IJ SOLN
INTRAMUSCULAR | Status: AC
  Filled 2018-01-30: qty 1

## 2018-01-30 MED ORDER — BUPIVACAINE-EPINEPHRINE 0.25% -1:200000 IJ SOLN
INTRAMUSCULAR | Status: DC | PRN
  Administered 2018-01-30: 20 mL

## 2018-01-30 MED ORDER — 0.9 % SODIUM CHLORIDE (POUR BTL) OPTIME
TOPICAL | Status: DC | PRN
  Administered 2018-01-30: 1000 mL

## 2018-01-30 MED ORDER — ONDANSETRON HCL 4 MG/2ML IJ SOLN
INTRAMUSCULAR | Status: DC | PRN
  Administered 2018-01-30: 4 mg via INTRAVENOUS

## 2018-01-30 MED ORDER — LIDOCAINE 2% (20 MG/ML) 5 ML SYRINGE
INTRAMUSCULAR | Status: DC | PRN
  Administered 2018-01-30: 100 mg via INTRAVENOUS

## 2018-01-30 MED ORDER — TRAMADOL HCL 50 MG PO TABS: 50.0000 mg | ORAL_TABLET | Freq: Four times a day (QID) | ORAL | Status: DC | PRN

## 2018-01-30 MED ORDER — OXYCODONE-ACETAMINOPHEN 5-325 MG PO TABS: 1.0000 | ORAL_TABLET | ORAL | Status: DC | PRN

## 2018-01-30 MED ORDER — LIDOCAINE 2% (20 MG/ML) 5 ML SYRINGE
INTRAMUSCULAR | Status: AC
  Filled 2018-01-30: qty 5

## 2018-01-30 MED ORDER — EPHEDRINE SULFATE-NACL 50-0.9 MG/10ML-% IV SOSY
PREFILLED_SYRINGE | INTRAVENOUS | Status: DC | PRN
  Administered 2018-01-30: 5 mg via INTRAVENOUS
  Administered 2018-01-30: 10 mg via INTRAVENOUS

## 2018-01-30 MED ORDER — SUGAMMADEX SODIUM 200 MG/2ML IV SOLN
INTRAVENOUS | Status: DC | PRN
  Administered 2018-01-30: 150 mg via INTRAVENOUS

## 2018-01-30 MED ORDER — MORPHINE SULFATE (PF) 2 MG/ML IV SOLN: 2.0000 mg | INTRAVENOUS | Status: DC | PRN

## 2018-01-30 MED ORDER — ONDANSETRON HCL 4 MG/2ML IJ SOLN
INTRAMUSCULAR | Status: AC
  Filled 2018-01-30: qty 2

## 2018-01-30 MED ORDER — FENTANYL CITRATE (PF) 250 MCG/5ML IJ SOLN
INTRAMUSCULAR | Status: AC
  Filled 2018-01-30: qty 5

## 2018-01-30 MED ORDER — LACTATED RINGERS IV SOLN
INTRAVENOUS | Status: DC
  Administered 2018-01-30 (×2): via INTRAVENOUS

## 2018-01-30 MED ORDER — ROCURONIUM BROMIDE 50 MG/5ML IV SOSY
PREFILLED_SYRINGE | INTRAVENOUS | Status: AC
  Filled 2018-01-30: qty 5

## 2018-01-30 MED ORDER — DEXAMETHASONE SODIUM PHOSPHATE 10 MG/ML IJ SOLN
INTRAMUSCULAR | Status: DC | PRN
  Administered 2018-01-30: 10 mg via INTRAVENOUS

## 2018-01-30 MED ORDER — FENTANYL CITRATE (PF) 100 MCG/2ML IJ SOLN
INTRAMUSCULAR | Status: DC | PRN
  Administered 2018-01-30: 100 ug via INTRAVENOUS
  Administered 2018-01-30 (×2): 50 ug via INTRAVENOUS

## 2018-01-30 MED ORDER — IBUPROFEN 200 MG PO TABS: 600.0000 mg | ORAL_TABLET | Freq: Four times a day (QID) | ORAL | Status: DC | PRN

## 2018-01-30 MED ORDER — PROPOFOL 10 MG/ML IV BOLUS
INTRAVENOUS | Status: DC | PRN
  Administered 2018-01-30: 150 mg via INTRAVENOUS
  Administered 2018-01-30: 40 mg via INTRAVENOUS

## 2018-01-30 SURGICAL SUPPLY — 49 items
ADH SKN CLS APL DERMABOND .7 (GAUZE/BANDAGES/DRESSINGS) ×1
BLADE CLIPPER SURG (BLADE) ×2 IMPLANT
CANISTER SUCT 3000ML PPV (MISCELLANEOUS) IMPLANT
CHLORAPREP W/TINT 26ML (MISCELLANEOUS) ×3 IMPLANT
COVER SURGICAL LIGHT HANDLE (MISCELLANEOUS) ×3 IMPLANT
COVER WAND RF STERILE (DRAPES) ×1 IMPLANT
DERMABOND ADVANCED (GAUZE/BANDAGES/DRESSINGS) ×2
DERMABOND ADVANCED .7 DNX12 (GAUZE/BANDAGES/DRESSINGS) ×1 IMPLANT
DRAIN PENROSE 1/2X12 LTX STRL (WOUND CARE) ×2 IMPLANT
DRAPE LAPAROTOMY TRNSV 102X78 (DRAPE) ×3 IMPLANT
DRAPE UTILITY XL STRL (DRAPES) ×6 IMPLANT
ELECT CAUTERY BLADE 6.4 (BLADE) ×3 IMPLANT
ELECT REM PT RETURN 9FT ADLT (ELECTROSURGICAL) ×3
ELECTRODE REM PT RTRN 9FT ADLT (ELECTROSURGICAL) ×1 IMPLANT
GLOVE BIO SURGEON STRL SZ8 (GLOVE) ×3 IMPLANT
GLOVE BIOGEL PI IND STRL 8 (GLOVE) ×1 IMPLANT
GLOVE BIOGEL PI INDICATOR 8 (GLOVE) ×2
GOWN STRL REUS W/ TWL LRG LVL3 (GOWN DISPOSABLE) ×1 IMPLANT
GOWN STRL REUS W/ TWL XL LVL3 (GOWN DISPOSABLE) ×1 IMPLANT
GOWN STRL REUS W/TWL LRG LVL3 (GOWN DISPOSABLE) ×9
GOWN STRL REUS W/TWL XL LVL3 (GOWN DISPOSABLE) ×3
KIT BASIN OR (CUSTOM PROCEDURE TRAY) ×3 IMPLANT
KIT TURNOVER KIT B (KITS) ×3 IMPLANT
MESH HERNIA SYS ULTRAPRO LRG (Mesh General) ×2 IMPLANT
NDL HYPO 25GX1X1/2 BEV (NEEDLE) ×1 IMPLANT
NEEDLE HYPO 25GX1X1/2 BEV (NEEDLE) ×3 IMPLANT
NS IRRIG 1000ML POUR BTL (IV SOLUTION) ×3 IMPLANT
PACK GENERAL/GYN (CUSTOM PROCEDURE TRAY) ×2 IMPLANT
PACK SURGICAL SETUP 50X90 (CUSTOM PROCEDURE TRAY) ×1 IMPLANT
PAD ARMBOARD 7.5X6 YLW CONV (MISCELLANEOUS) ×3 IMPLANT
PENCIL BUTTON HOLSTER BLD 10FT (ELECTRODE) ×1 IMPLANT
PENCIL SMOKE EVACUATOR (MISCELLANEOUS) ×2 IMPLANT
SPONGE LAP 18X18 X RAY DECT (DISPOSABLE) ×1 IMPLANT
SUT MNCRL AB 4-0 PS2 18 (SUTURE) ×3 IMPLANT
SUT NOVA NAB DX-16 0-1 5-0 T12 (SUTURE) ×5 IMPLANT
SUT SILK 2 0 SH (SUTURE) IMPLANT
SUT VIC AB 0 CT1 27 (SUTURE) ×3
SUT VIC AB 0 CT1 27XBRD ANBCTR (SUTURE) IMPLANT
SUT VIC AB 2-0 SH 27 (SUTURE) ×3
SUT VIC AB 2-0 SH 27X BRD (SUTURE) ×1 IMPLANT
SUT VIC AB 3-0 SH 18 (SUTURE) ×3 IMPLANT
SUT VICRYL AB 3 0 TIES (SUTURE) ×3 IMPLANT
SYR BULB 3OZ (MISCELLANEOUS) IMPLANT
SYR CONTROL 10ML LL (SYRINGE) ×3 IMPLANT
TOWEL OR 17X24 6PK STRL BLUE (TOWEL DISPOSABLE) ×3 IMPLANT
TOWEL OR 17X26 10 PK STRL BLUE (TOWEL DISPOSABLE) ×1 IMPLANT
TUBE CONNECTING 12'X1/4 (SUCTIONS)
TUBE CONNECTING 12X1/4 (SUCTIONS) IMPLANT
YANKAUER SUCT BULB TIP NO VENT (SUCTIONS) IMPLANT

## 2018-01-30 NOTE — Plan of Care (Signed)
  Problem: Pain Managment: Goal: General experience of comfort will improve Outcome: Progressing   Problem: Safety: Goal: Ability to remain free from injury will improve Outcome: Progressing   

## 2018-01-30 NOTE — Anesthesia Procedure Notes (Signed)
Procedure Name: Intubation Date/Time: 01/30/2018 9:59 AM Performed by: Lance Coon, CRNA Pre-anesthesia Checklist: Patient identified, Emergency Drugs available, Suction available, Patient being monitored and Timeout performed Patient Re-evaluated:Patient Re-evaluated prior to induction Oxygen Delivery Method: Circle system utilized Preoxygenation: Pre-oxygenation with 100% oxygen Induction Type: IV induction Ventilation: Mask ventilation without difficulty Laryngoscope Size: Miller and 3 Grade View: Grade I Tube type: Oral Tube size: 7.5 mm Number of attempts: 1 Airway Equipment and Method: Stylet Placement Confirmation: ETT inserted through vocal cords under direct vision,  positive ETCO2 and breath sounds checked- equal and bilateral Secured at: 21 cm Tube secured with: Tape Dental Injury: Teeth and Oropharynx as per pre-operative assessment

## 2018-01-30 NOTE — Anesthesia Postprocedure Evaluation (Signed)
Anesthesia Post Note  Patient: Jason [name]  Procedure(s) Performed: OPEN RIGHT INGUINAL HERNIA REPAIR WITH MESH (Right )     Patient location during evaluation: PACU Anesthesia Type: General Level of consciousness: awake and alert Pain management: pain level controlled Vital Signs Assessment: post-procedure vital signs reviewed and stable Respiratory status: spontaneous breathing, nonlabored ventilation, respiratory function stable and patient connected to nasal cannula oxygen Cardiovascular status: blood pressure returned to baseline and stable Postop Assessment: no apparent nausea or vomiting Anesthetic complications: no    Last Vitals:  Vitals:   01/30/18 1140 01/30/18 1155  BP: 134/72 (!) 147/78  Pulse: 85 91  Resp: 16 18  Temp: 36.5 C (!) 36.3 C  SpO2: 94% 94%    Last Pain:  Vitals:   01/30/18 1155  TempSrc: Oral  PainSc:                  [name]

## 2018-01-30 NOTE — Discharge Summary (Addendum)
Name: Jason [name] MRN: [id number] DOB: 12-Jun-1945 73 y.o. PCP: Janifer Adie, MD  Date of Admission: 01/26/2018 11:23 PM Date of Discharge: 01/31/2018 Attending Physician: Oda Kilts, MD  Discharge Diagnosis: 1. SBO 2/2 incarcerated right inguinal hernia 2. Esophagitis  Discharge Medications: Allergies as of 01/31/2018   No Known Allergies     Medication List    STOP taking these medications   cefdinir 300 MG capsule Commonly known as:  OMNICEF   folic acid 1 MG tablet Commonly known as:  FOLVITE   HYDROcodone-acetaminophen 5-325 MG tablet Commonly known as:  NORCO   iron polysaccharides 150 MG capsule Commonly known as:  NIFEREX     TAKE these medications   acetaminophen 325 MG tablet Commonly known as:  TYLENOL Take 2 tablets (650 mg total) by mouth every 6 (six) hours as needed for mild pain (or Fever >/= 101).   albuterol (2.5 MG/3ML) 0.083% nebulizer solution Commonly known as:  PROVENTIL Take 3 mLs (2.5 mg total) by nebulization every 4 (four) hours as needed for wheezing or shortness of breath.   collagenase ointment Commonly known as:  SANTYL Apply topically daily. Apply to sacral wound daily.   docusate sodium 100 MG capsule Commonly known as:  COLACE Take 1 capsule (100 mg total) by mouth 2 (two) times daily as needed (take to keep stool soft.).   feeding supplement (GLUCERNA SHAKE) Liqd Take 237 mLs by mouth daily.   ibuprofen 600 MG tablet Commonly known as:  ADVIL,MOTRIN Take 1 tablet (600 mg total) by mouth every 6 (six) hours as needed for mild pain (Not relieved by Tylenol).   multivitamin with minerals Tabs tablet Take 1 tablet by mouth daily.   pantoprazole 40 MG tablet Commonly known as:  PROTONIX Take 1 tablet (40 mg total) by mouth 2 (two) times daily for 30 days. Take 40 mg twice a day for one month and then continue to take 40 mg once a day.   promethazine 25 MG tablet Commonly known as:  PHENERGAN Take 25 mg by  mouth every 6 (six) hours as needed for nausea or vomiting.   sertraline 50 MG tablet Commonly known as:  ZOLOFT Take 50 mg by mouth daily.   thiamine 100 MG tablet Take 1 tablet (100 mg total) by mouth daily.       Disposition and follow-up:   Mr.Jason [name] was discharged from [facility] in Stable condition.  At the hospital follow up visit please address:  1.  Esophagitis- patient to continue 40 mg twice a day for one month and then to continue 40 mg once a day  Right inguinal hernia repair- patient had an open right inguinal repair on 0/35 with no complications, he was advised to take tylenol 650 mg every 6 hours as needed  2.  Labs / imaging needed at time of follow-up: none  3.  Pending labs/ test needing follow-up: none  Follow-up Appointments: Follow-up Information    [facility], [place] Follow up on 02/13/2018.   Specialty:  General Surgery Why:  02/13 at 1:45 pm. Please arrive 30 minutes prior to your appointment. Please bring a copy of your photo ID and insurance card.  Contact information: [address] [place] [address] [phone]           Hospital Course by problem list:  [name] presented with abdominal pain and coffee-ground emesis. In the ED, he was found to have non-reducible inguinal hernia and  CT scan revealed an SBO with transition point in the right inguinal hernia, also evidence of esophagitis with small fluid collection and mucosal tear but no perforation.   1. SBO 2/2 incarcerated right inguinal hernia- Surgery was consulted and was able to reduce the hernia without difficulty. They performed an open right inguinal hernia repair on 1/54 with no complications. He was discharged to continue tylenol 650 mg every six hours as needed for pain and to follow up with surgery on 2/13.  2. Esophagitis- He had no further emesis since admission. GI was consulted and recommended to continue IV PPI and a low  residue diet. He was discharged to continue pantoprazole 40 mg bid for one month and then to continue 40 mg once a day.   Discharge Vitals:   BP (!) 141/71 (BP Location: Left Arm)   Pulse 80   Temp 98.5 F (36.9 C) (Oral)   Resp 16   Ht 5\' 8"  (1.727 m)   Wt 74.7 kg   SpO2 100%   BMI 25.04 kg/m   Pertinent Labs, Studies, and Procedures:   CBC Latest Ref Rng & Units 01/31/2018 01/29/2018 01/28/2018  WBC 4.0 - 10.5 K/uL 12.6(H) 5.8 6.6  Hemoglobin 13.0 - 17.0 g/dL 13.0 12.9(L) 13.8  Hematocrit 39.0 - 52.0 % 40.9 42.4 46.8  Platelets 150 - 400 K/uL 220 197 196   Ct Abdomen Pelvis W Contrast  Result Date: 01/27/2018 CLINICAL DATA:  Abdominal pain EXAM: CT ABDOMEN AND PELVIS WITH CONTRAST TECHNIQUE: Multidetector CT imaging of the abdomen and pelvis was performed using the standard protocol following bolus administration of intravenous contrast. CONTRAST:  123mL ISOVUE-300 IOPAMIDOL (ISOVUE-300) INJECTION 61% COMPARISON:  CT 03/29/2017 FINDINGS: Lower chest: Lung bases demonstrate mildly nodular left greater than right airspace disease. No pleural effusion. Heart size is normal. Distal esophageal thickening. Small focus of gas and fluid extending toward the extraluminal surface of the esophagus. No mediastinal air. Hepatobiliary: No focal liver abnormality is seen. No gallstones, gallbladder wall thickening, or biliary dilatation. Pancreas: Unremarkable. No pancreatic ductal dilatation or surrounding inflammatory changes. Spleen: Normal in size without focal abnormality. Adrenals/Urinary Tract: Adrenal glands are normal. No hydronephrosis. The bladder is normal Stomach/Bowel: The stomach is nonenlarged. Multiple dilated fluid-filled loops of small bowel up to 3 cm. Transition point related to a right bowel containing inguinal hernia. Diffuse diverticular disease of the colon. Vascular/Lymphatic: Extensive aortic atherosclerosis. No aneurysmal dilatation. Stable distal esophageal lymph nodes  Reproductive: Massively enlarged prostate gland with mass effect on the bladder. Large bilateral scrotal hydroceles. Other: No free air or significant free fluid Musculoskeletal: Degenerative changes. No acute or suspicious abnormality. IMPRESSION: 1. Multiple loops of dilated small bowel, consistent with a bowel obstruction. Transition point is at a bowel containing right inguinal hernia. 2. Distal esophageal thickening suggesting esophagitis. Small gas and fluid collection extending into the mucosa of the distal esophagus, possibly representing a small mucosal tear. There is no pneumomediastinum to suggest a through and through perforation. 3. Nodular airspace disease in the left greater than right lung base which may reflect pneumonia or aspiration 4. Markedly enlarged prostate gland with mass effect on the bladder Electronically Signed   By: Donavan Foil M.D.   On: 01/27/2018 02:00   Dg Abdomen Acute W/chest  Result Date: 01/27/2018 CLINICAL DATA:  Abdominal pain EXAM: DG ABDOMEN ACUTE W/ 1V CHEST COMPARISON:  07/05/2017, 10/19/2016, CT chest 05/02/2016 FINDINGS: Single-view chest demonstrates airspace disease at the left base. Normal heart size. Decubitus and supine views of  the abdomen demonstrate a nonobstructed bowel-gas pattern. No free air. Residual contrast within the bowel. IMPRESSION: 1. Airspace disease at the left base may reflect atelectasis, pneumonia or aspiration 2. Nonobstructed bowel-gas pattern Electronically Signed   By: Donavan Foil M.D.   On: 01/27/2018 00:41   Discharge Instructions: Discharge Instructions    Apply ice to affected area   Complete by:  As directed    Apply ice to R groin incision prn   Diet - low sodium heart healthy   Complete by:  As directed    Increase activity slowly   Complete by:  As directed      Mr. Adewale, [place] were hospitalized due to inflammation in your esophagus and your inguinal hernia. I want you to continue taking 40 mg of protonix  twice a day for one month and then continue taking 40 mg of protonix once a day. For your pain, use tylenol 650 mg every six hours as needed.   Signed: [name], DO 01/31/2018, 10:21 AM

## 2018-01-30 NOTE — Anesthesia Preprocedure Evaluation (Signed)
Anesthesia Evaluation  Patient identified by MRN, date of birth, ID band Patient confused    Reviewed: Allergy & Precautions, NPO status , Patient's Chart, lab work & pertinent test results  Airway Mallampati: II  TM Distance: >3 FB Neck ROM: Full    Dental no notable dental hx. (+) Poor Dentition   Pulmonary COPD, Current Smoker, former smoker,    Pulmonary exam normal breath sounds clear to auscultation       Cardiovascular hypertension, Pt. on medications negative cardio ROS Normal cardiovascular exam Rhythm:Regular Rate:Normal     Neuro/Psych Dementia CVA, Residual Symptoms    GI/Hepatic negative GI ROS, Neg liver ROS,   Endo/Other  negative endocrine ROS  Renal/GU negative Renal ROS  negative genitourinary   Musculoskeletal negative musculoskeletal ROS (+)   Abdominal   Peds negative pediatric ROS (+)  Hematology negative hematology ROS (+)   Anesthesia Other Findings   Reproductive/Obstetrics negative OB ROS                             Anesthesia Physical Anesthesia Plan  ASA: II  Anesthesia Plan: General   Post-op Pain Management:    Induction: Intravenous  PONV Risk Score and Plan: 2 and Ondansetron, Dexamethasone and Treatment may vary due to age or medical condition  Airway Management Planned: LMA and Oral ETT  Additional Equipment:   Intra-op Plan:   Post-operative Plan: Extubation in OR  Informed Consent: I have reviewed the patients History and Physical, chart, labs and discussed the procedure including the risks, benefits and alternatives for the proposed anesthesia with the patient or authorized representative who has indicated his/her understanding and acceptance.     Dental advisory given  Plan Discussed with: CRNA  Anesthesia Plan Comments: (Dementia. Not a candidate for TAP block)        Anesthesia Quick Evaluation

## 2018-01-30 NOTE — Transfer of Care (Signed)
Immediate Anesthesia Transfer of Care Note  Patient: Jason [name]  Procedure(s) Performed: OPEN RIGHT INGUINAL HERNIA REPAIR WITH MESH (Right )  Patient Location: PACU  Anesthesia Type:General  Level of Consciousness: drowsy, patient cooperative and responds to stimulation  Airway & Oxygen Therapy: Patient Spontanous Breathing and Patient connected to nasal cannula oxygen  Post-op Assessment: Report given to RN and Post -op Vital signs reviewed and stable  Post vital signs: Reviewed and stable  Last Vitals:  Vitals Value Taken Time  BP 134/70 01/30/2018 11:22 AM  Temp 36.5 C 01/30/2018 11:22 AM  Pulse 84 01/30/2018 11:24 AM  Resp 18 01/30/2018 11:24 AM  SpO2 93 % 01/30/2018 11:24 AM  Vitals shown include unvalidated device data.  Last Pain:  Vitals:   01/30/18 1122  TempSrc:   PainSc: Asleep         Complications: No apparent anesthesia complications

## 2018-01-30 NOTE — Interval H&P Note (Signed)
History and Physical Interval Note:  01/30/2018 9:07 AM  Jason [name]  has presented today for surgery, with the diagnosis of RIGHT INGUINAL HERNIA  The various methods of treatment have been discussed with the patient and family. After consideration of risks, benefits and other options for treatment, the patient has consented to  Procedure(s): OPEN RIGHT INGUINAL HERNIA REPAIR WITH MESH (Right) as a surgical intervention .  The patient's history has been reviewed, patient examined, no change in status, stable for surgery.  I have reviewed the patient's chart and labs.  Questions were answered to the patient's satisfaction.     [place]

## 2018-01-30 NOTE — Op Note (Signed)
Preoperative diagnosis: History of incarcerated right inguinal hernia  Postop diagnosis: Direct reducible right inguinal hernia  Procedure: Repair of right inguinal hernia with mesh  Surgeon: Erroll Luna, MD  Anesthesia: General with 0.25% Sensorcaine local with epinephrine  EBL 10 cc  Specimen none  Drains none  IV fluids: Per anesthesia record  Indications for procedure: The patient 73 year old male who presented with incarcerated but reducible inguinal hernia on the right.  He is a nursing home patient this is recurred therefore recommend repair to prevent recurrence.  Risk, benefits and other options discussed with the patient and his family at the bedside.  They agree to proceed.The risk of hernia repair include bleeding,  Infection,   Recurrence of the hernia,  Mesh use, chronic pain,  Organ injury,  Bowel injury,  Bladder injury,   nerve injury with numbness around the incision,  Death,  and worsening of preexisting  medical problems.  The alternatives to surgery have been discussed as well..  Long term expectations of both operative and non operative treatments have been discussed.   The patient agrees to proceed.   Description of procedure: The patient was met in the holding area.  His daughter was with him and questions were answered.  The right side was marked as the correct side.  He was then taken back to the operative room and placed supine upon the operating room table.  After induction of general anesthesia, the right inguinal region was prepped and draped in a sterile fashion and timeout was performed.  Proper side, patient procedure were verified.  He received preoperative clindamycin.  A right inguinal incision was made after infiltration with local anesthetic.  Dissection was carried through Scarpa's fascia until the aponeurosis of the external oblique was encountered.  This was blocked down with Marcaine.  The external ring was notified.  The roof of the anal canal was  opened.  Cord structures were encircled quarter-inch Penrose drain to include the ilioinguinal nerve.  He had a large direct defect.  I reduced hernia spat sac back in the preperitoneal space.  A large ultra pro hernia system was used with the inner leaflet placed in the preperitoneal space and the onlay placed onto the floor the inguinal canal.  It was sutured to the shelving edge of the inguinal ligament, pubic tubercle and conjoined tendon with 2-0 Novafil.  A slit was cut for the cord structures and nerves exit this was closed with 2-0 Novafil.  There is ample room for the cord and nerve exit.  There is no tension on the cord or the repair.  Hemostasis was excellent.  Aponeurosis of the external bleed closed with 2-0 Vicryl.  3-0 Vicryl was used to Close Scarpa's fascia and 4-0 Monocryl used to close the skin in subcuticular fashion.  Dermabond applied.  All final counts found to be correct of sponge, needle instruments.  Patient was awoke extubated taken to recovery in satisfactory condition.

## 2018-01-30 NOTE — Progress Notes (Signed)
   Subjective: Patient reported feeling well today. He was about to head down for surgery. No complaints.   Objective:  Vital signs in last 24 hours: Vitals:   01/29/18 0953 01/29/18 1328 01/29/18 2010 01/30/18 0500  BP: 133/62 133/79 138/75 140/70  Pulse: 83 71 61 60  Resp: 16 18 16 14   Temp: (!) 97.5 F (36.4 C) (!) 97.2 F (36.2 C) 98.3 F (36.8 C) 98.1 F (36.7 C)  TempSrc: Oral Oral Oral Oral  SpO2: 95% 100% 98% 97%  Weight:      Height:       Gen: comfortably laying in bed, no distress CV: no murmurs, RRR Ext: no edema  Assessment/Plan:  Principal Problem:   Small bowel obstruction (HCC) Active Problems:   Dementia (HCC)   Inguinal hernia   Acute esophagitis   Hematemesis   Pressure injury of skin  Jason [name] is a80yo male [facility] patient with dementia, copd, bladder cancer s/p resection who presents from a nursing facility with abdominal pain and coffee-ground emesis. Found to have SBO 2/2 incarcerated inguinal hernia which has now been successfully reduced.   SBO 2/2 Incarcerate Right Inguinal Hernia - Right inguinal hernia repair today - General surgery on board, appreciate recommendations  Coffee ground emesis, esophagitis - Will continue to monitor  - No more emesis since admission - Hemodynamically stable - IV PPI -Phenerganprn  - Avoid NG tube for now given esophagitis   Dispo: Anticipated discharge in approximately 1-2 day(s).   [name], DO 01/30/2018, 10:12 AM Pager: [phone]

## 2018-01-30 NOTE — Progress Notes (Signed)
  Date: 01/30/2018  Patient name: Jason [name]  Medical record number: [id number]  Date of birth: 1945-08-29   I have seen and evaluated this patient and I have discussed the plan of care with the house staff. Please see their note for complete details. I concur with their findings with the following additions/corrections:   Seen with the team on rounds this morning just prior to going to surgery.  Appreciate surgery team assistance, it appears he had a successful right inguinal hernia repair today.  He has had no further evidence of SBO and is tolerating a regular diet without difficulty.  He has had no further evidence of upper GI bleeding, nor has he had any evidence of an esophageal perforation.  As soon as he has recovered from surgery to the point of being able to function at his baseline, he likely will be ready for discharge back to his facility.  Lenice Pressman, M.D., Ph.D. 01/30/2018, 11:40 AM

## 2018-01-31 ENCOUNTER — Encounter (HOSPITAL_COMMUNITY): Payer: Self-pay | Admitting: Surgery

## 2018-01-31 LAB — BPAM RBC
Blood Product Expiration Date: 202002152359
Blood Product Expiration Date: 202002152359
Unit Type and Rh: 6200
Unit Type and Rh: 6200

## 2018-01-31 LAB — TYPE AND SCREEN
ABO/RH(D): AB POS
Antibody Screen: NEGATIVE
Unit division: 0
Unit division: 0

## 2018-01-31 LAB — CBC
HCT: 40.9 % (ref 39.0–52.0)
Hemoglobin: 13 g/dL (ref 13.0–17.0)
MCH: 22.6 pg — ABNORMAL LOW (ref 26.0–34.0)
MCHC: 31.8 g/dL (ref 30.0–36.0)
MCV: 71.1 fL — ABNORMAL LOW (ref 80.0–100.0)
Platelets: 220 10*3/uL (ref 150–400)
RBC: 5.75 MIL/uL (ref 4.22–5.81)
RDW: 15.6 % — AB (ref 11.5–15.5)
WBC: 12.6 10*3/uL — ABNORMAL HIGH (ref 4.0–10.5)
nRBC: 0 % (ref 0.0–0.2)

## 2018-01-31 MED ORDER — ACETAMINOPHEN 325 MG PO TABS: 650.0000 mg | ORAL_TABLET | Freq: Four times a day (QID) | ORAL | Status: DC | PRN

## 2018-01-31 MED ORDER — SENNOSIDES-DOCUSATE SODIUM 8.6-50 MG PO TABS
1.0000 | ORAL_TABLET | Freq: Two times a day (BID) | ORAL | Status: DC
  Administered 2018-01-31: 1 via ORAL
  Filled 2018-01-31: qty 1

## 2018-01-31 MED ORDER — POLYETHYLENE GLYCOL 3350 17 G PO PACK
17.0000 g | PACK | Freq: Every day | ORAL | Status: DC
  Administered 2018-01-31: 17 g via ORAL
  Filled 2018-01-31: qty 1

## 2018-01-31 MED ORDER — ENOXAPARIN SODIUM 40 MG/0.4ML ~~LOC~~ SOLN: 40.0000 mg | SUBCUTANEOUS | Status: DC

## 2018-01-31 MED ORDER — PANTOPRAZOLE SODIUM 40 MG PO TBEC: 40.0000 mg | DELAYED_RELEASE_TABLET | Freq: Two times a day (BID) | ORAL | 0 refills | Status: DC

## 2018-01-31 MED ORDER — IBUPROFEN 600 MG PO TABS: 600.0000 mg | ORAL_TABLET | Freq: Four times a day (QID) | ORAL | 0 refills | Status: DC | PRN

## 2018-01-31 NOTE — Clinical Social Work Placement (Signed)
   CLINICAL SOCIAL WORK PLACEMENT  NOTE  Date:  01/31/2018  Patient Details  Name: Jason [name] MRN: [id number] Date of Birth: April 14, 1945  Clinical Social Work is seeking post-discharge placement for this patient at the [place] level of care (*CSW will initial, date and re-position this form in  chart as items are completed):      Patient/family provided with [facility] Work Department's list of facilities offering this level of care within the geographic area requested by the patient (or if unable, by the patient's family).  Yes   Patient/family informed of their freedom to choose among providers that offer the needed level of care, that participate in Medicare, Medicaid or managed care program needed by the patient, have an available bed and are willing to accept the patient.      Patient/family informed of [facility]'s ownership interest in [facility] and [facility], as well as of the fact that they are under no obligation to receive care at these facilities.  PASRR submitted to EDS on       PASRR number received on 01/31/18     Existing PASRR number confirmed on       FL2 transmitted to all facilities in geographic area requested by pt/family on 01/31/18     FL2 transmitted to all facilities within larger geographic area on       Patient informed that his/her managed care company has contracts with or will negotiate with certain facilities, including the following:        Yes   Patient/family informed of bed offers received.  Patient chooses bed at [facility]     Physician recommends and patient chooses bed at      Patient to be transferred to [facility] on 01/31/18.  Patient to be transferred to facility by [facility]     Patient family notified on 01/31/18 of transfer.  Name of family member notified:  [name] (daughter)     PHYSICIAN       Additional Comment:     _______________________________________________ Alberteen Sam, LCSW 01/31/2018, 10:47 AM

## 2018-01-31 NOTE — Progress Notes (Signed)
Report called to [facility], all questions answered. All belongings gathered to be sent with him. [facility] ride set up to take him back to facility.

## 2018-01-31 NOTE — Discharge Instructions (Signed)
[name], Jason [name] were hospitalized due to inflammation in your esophagus and your inguinal hernia. I want you to continue taking 40 mg of protonix twice a day for one month and then continue taking 40 mg of protonix once a day. For your pain, use tylenol 650 mg every six hours as needed.    CCS _______[facility]  UMBILICAL OR INGUINAL HERNIA REPAIR: POST OP INSTRUCTIONS  Always review your discharge instruction sheet given to you by the facility where your surgery was performed. IF YOU HAVE DISABILITY OR FAMILY LEAVE FORMS, YOU MUST BRING THEM TO THE OFFICE FOR PROCESSING.   DO NOT GIVE THEM TO YOUR DOCTOR.  1. A  prescription for pain medication may be given to you upon discharge.  Take your pain medication as prescribed, if needed.  If narcotic pain medicine is not needed, then you may take acetaminophen (Tylenol) or ibuprofen (Advil) as needed. 2. Take your usually prescribed medications unless otherwise directed. If you need a refill on your pain medication, please contact your pharmacy.  They will contact our office to request authorization. Prescriptions will not be filled after 5 pm or on week-ends. 3. You should follow a light diet the first 24 hours after arrival home, such as soup and crackers, etc.  Be sure to include lots of fluids daily.  Resume your normal diet the day after surgery. 4.Most patients will experience some swelling and bruising around the umbilicus or in the groin and scrotum.  Ice packs and reclining will help.  Swelling and bruising can take several days to resolve.  6. It is common to experience some constipation if taking pain medication after surgery.  Increasing fluid intake and taking a stool softener (such as Colace) will usually help or prevent this problem from occurring.  A mild laxative (Milk of Magnesia or Miralax) should be taken according to package directions if there are no bowel movements after 48 hours. 7. Unless discharge  instructions indicate otherwise, you may remove your bandages 24-48 hours after surgery, and you may shower at that time.  You may have steri-strips (small skin tapes) in place directly over the incision.  These strips should be left on the skin for 7-10 days.  If your surgeon used skin glue on the incision, you may shower in 24 hours.  The glue will flake off over the next 2-3 weeks.  Any sutures or staples will be removed at the office during your follow-up visit. 8. ACTIVITIES:  You may resume regular (light) daily activities beginning the next day--such as daily self-care, walking, climbing stairs--gradually increasing activities as tolerated.  You may have sexual intercourse when it is comfortable.  Refrain from any heavy lifting or straining until approved by your doctor.  a.You may drive when you are no longer taking prescription pain medication, you can comfortably wear a seatbelt, and you can safely maneuver your car and apply brakes. b.RETURN TO WORK:   _____________________________________________  9.You should see your doctor in the office for a follow-up appointment approximately 2-3 weeks after your surgery.  Make sure that you call for this appointment within a day or two after you arrive home to insure a convenient appointment time. 10.OTHER INSTRUCTIONS: _________________________    _____________________________________  WHEN TO CALL YOUR DOCTOR: 1. Fever over 101.0 2. Inability to urinate 3. Nausea and/or vomiting 4. Extreme swelling or bruising 5. Continued bleeding from incision. 6. Increased pain, redness, or drainage from the incision  The clinic staff  is available to answer your questions during regular business hours.  Please dont hesitate to call and ask to speak to one of the nurses for clinical concerns.  If you have a medical emergency, go to the nearest emergency room or call 911.  A surgeon from [facility] is always on call at the hospital   [address], [place], [place], [place]  [address] ?  [address], [place], [place]   [address] [phone] ? [phone] ? FAX [phone] Web site: [email or web address]     Managing Your Pain After Surgery Without Opioids    Thank you for participating in our program to help patients manage their pain after surgery without opioids. This is part of our effort to provide you with the best care possible, without exposing you or your family to the risk that opioids pose.  What pain can I expect after surgery? You can expect to have some pain after surgery. This is normal. The pain is typically worse the day after surgery, and quickly begins to get better. Many studies have found that many patients are able to manage their pain after surgery with Over-the-Counter (OTC) medications such as Tylenol and Motrin. If you have a condition that does not allow you to take Tylenol or Motrin, notify your surgical team.  How will I manage my pain? The best strategy for controlling your pain after surgery is around the clock pain control with Tylenol (acetaminophen) and Motrin (ibuprofen or Advil). Alternating these medications with each other allows you to maximize your pain control. In addition to Tylenol and Motrin, you can use heating pads or ice packs on your incisions to help reduce your pain.  How will I alternate your regular strength over-the-counter pain medication? You will take a dose of pain medication every three hours. ; Start by taking 650 mg of Tylenol (2 pills of 325 mg) ; 3 hours later take 600 mg of Motrin (3 pills of 200 mg) ; 3 hours after taking the Motrin take 650 mg of Tylenol ; 3 hours after that take 600 mg of Motrin.   - 1 -  See example - if your first dose of Tylenol is at 12:00 PM   12:00 PM Tylenol 650 mg (2 pills of 325 mg)  3:00 PM Motrin 600 mg (3 pills of 200 mg)  6:00 PM Tylenol 650 mg (2 pills of 325 mg)  9:00 PM Motrin 600 mg (3  pills of 200 mg)  Continue alternating every 3 hours   We recommend that you follow this schedule around-the-clock for at least 3 days after surgery, or until you feel that it is no longer needed. Use the table on the last page of this handout to keep track of the medications you are taking. Important: Do not take more than 3000mg  of Tylenol or 3200mg  of Motrin in a 24-hour period. Do not take ibuprofen/Motrin if you have a history of bleeding stomach ulcers, severe kidney disease, &/or actively taking a blood thinner  What if I still have pain? If you have pain that is not controlled with the over-the-counter pain medications (Tylenol and Motrin or Advil) you might have what we call breakthrough pain. You will receive a prescription for a small amount of an opioid pain medication such as Oxycodone, Tramadol, or Tylenol with Codeine. Use these opioid pills in the first 24 hours after surgery if you have breakthrough pain. Do not take more than 1 pill every 4-6 hours.  If you still have uncontrolled pain after using all opioid pills, don't hesitate to call our staff using the number provided. We will help make sure you are managing your pain in the best way possible, and if necessary, we can provide a prescription for additional pain medication.   Day 1    Time  Name of Medication Number of pills taken  Amount of Acetaminophen  Pain Level   Comments  AM PM       AM PM       AM PM       AM PM       AM PM       AM PM       AM PM       AM PM       Total Daily amount of Acetaminophen Do not take more than  3,000 mg per day      Day 2    Time  Name of Medication Number of pills taken  Amount of Acetaminophen  Pain Level   Comments  AM PM       AM PM       AM PM       AM PM       AM PM       AM PM       AM PM       AM PM       Total Daily amount of Acetaminophen Do not take more than  3,000 mg per day      Day 3    Time  Name of Medication Number of pills taken   Amount of Acetaminophen  Pain Level   Comments  AM PM       AM PM       AM PM       AM PM          AM PM       AM PM       AM PM       AM PM       Total Daily amount of Acetaminophen Do not take more than  3,000 mg per day      Day 4    Time  Name of Medication Number of pills taken  Amount of Acetaminophen  Pain Level   Comments  AM PM       AM PM       AM PM       AM PM       AM PM       AM PM       AM PM       AM PM       Total Daily amount of Acetaminophen Do not take more than  3,000 mg per day      Day 5    Time  Name of Medication Number of pills taken  Amount of Acetaminophen  Pain Level   Comments  AM PM       AM PM       AM PM       AM PM       AM PM       AM PM       AM PM       AM PM       Total Daily amount of Acetaminophen Do not take more than  3,000 mg per day  Day 6    Time  Name of Medication Number of pills taken  Amount of Acetaminophen  Pain Level  Comments  AM PM       AM PM       AM PM       AM PM       AM PM       AM PM       AM PM       AM PM       Total Daily amount of Acetaminophen Do not take more than  3,000 mg per day      Day 7    Time  Name of Medication Number of pills taken  Amount of Acetaminophen  Pain Level   Comments  AM PM       AM PM       AM PM       AM PM       AM PM       AM PM       AM PM       AM PM       Total Daily amount of Acetaminophen Do not take more than  3,000 mg per day        For additional information about how and where to safely dispose of unused opioid medications - [employer].com.br  Disclaimer: This document contains information and/or instructional materials adapted from [place] for the typical patient with your condition. It does not replace medical advice from your health care provider because your experience may differ from that of the typical patient. Talk to your health care provider if you have any questions about  this document, your condition or your treatment plan. Adapted from [place]

## 2018-01-31 NOTE — Plan of Care (Signed)
  Problem: Activity: Goal: Risk for activity intolerance will decrease Outcome: Progressing   Problem: Elimination: Goal: Will not experience complications related to bowel motility Outcome: Progressing   Problem: Pain Managment: Goal: General experience of comfort will improve Outcome: Progressing   

## 2018-01-31 NOTE — Progress Notes (Signed)
Patient will DC to: [facility] Anticipated DC date: 01/31/2018 Family notified:[place] Transport by: [facility]  Per MD patient ready for DC to [place], patient, patient's family, and facility notified of DC. Discharge Summary sent to facility. RN given number for report [phone]. DC packet on chart. Ambulance transport requested for patient via [facility] who is coordinating with nurse.  CSW signing off.  [place], [place]

## 2018-01-31 NOTE — Clinical Social Work Note (Signed)
Clinical Social Work Assessment  Patient Details  Name: Jason [name] MRN: [id number] Date of Birth: 11-24-1945  Date of referral:  01/31/18               Reason for consult:  Discharge Planning                Permission sought to share information with:  Case Manager, Facility Sport and exercise psychologist, Family Supports Permission granted to share information::  Yes, Verbal Permission Granted  Name::     Jason [name]  Agency::  SNFs  Relationship::  daughter  Contact Information:  [phone]  Housing/Transportation Living arrangements for the past 2 months:  [place] of Information:  Patient Patient Interpreter Needed:  None Criminal Activity/Legal Involvement Pertinent to Current Situation/Hospitalization:  No - Comment as needed Significant Relationships:  Adult Children Lives with:  Self Do you feel safe going back to the place where you live?  Yes Need for family participation in patient care:  Yes (Comment)  Care giving concerns:  CSW received referral for possible SNF placement at time of discharge. Spoke with patient regarding possibility of SNF placement . Patient's daughters  are currently unable to care for him at their home given patient's current needs and fall risk.  Patient and daughter Jason [name] at bedside expressed understanding of PT recommendation and are agreeable to SNF placement at time of discharge. CSW to continue to follow and assist with discharge planning needs.     Social Worker assessment / plan:  Spoke with patient and Jason [name] daughter at bedside     concerning possibility of rehab at [facility] before returning home. Patient is from [facility] and will return.    Employment status:  Retired Forensic scientist:  Other (Comment Required)([facility]) PT Recommendations:  [place] / Referral to community resources:  [place]  Patient/Family's Response to care:  Patient and  Daughter  Jason [name] at bedside  recognize need for rehab before returning home and are agreeable to a SNF in [place]. They report preference for returning to [place]   . CSW explained insurance authorization process. Patient's family reported that they want patient to get stronger to be able to come back home.    Patient/Family's Understanding of and Emotional Response to Diagnosis, Current Treatment, and Prognosis:  Patient/family is realistic regarding therapy needs and expressed being hopeful for SNF placement. Patient expressed understanding of CSW role and discharge process as well as medical condition. No questions/concerns about plan or treatment.    Emotional Assessment Appearance:  Appears stated age Attitude/Demeanor/Rapport:  Engaged Affect (typically observed):  Adaptable Orientation:  Oriented to Self Alcohol / Substance use:  Not Applicable Psych involvement (Current and /or in the community):  No (Comment)  Discharge Needs  Concerns to be addressed:  Discharge Planning Concerns Readmission within the last 30 days:  No Current discharge risk:  Dependent with Mobility Barriers to Discharge:  Continued Medical Work up   [employer], LCSW 01/31/2018, 10:42 AM

## 2018-01-31 NOTE — Progress Notes (Signed)
[facility] Progress Note  1 Day Post-Op  Subjective: CC: no complaints Patient answers yes to all my questions initially but appears comfortable. Tolerating diet. Unable to tell me if he is passing gas yet.  Daughter at bedside. Patient lives in nursing facility.   Objective: Vital signs in last 24 hours: Temp:  [97.4 F (36.3 C)-98.6 F (37 C)] 98.5 F (36.9 C) (01/31 0336) Pulse Rate:  [80-100] 80 (01/31 0336) Resp:  [14-18] 16 (01/31 0336) BP: (118-147)/(66-78) 141/71 (01/31 0336) SpO2:  [92 %-100 %] 100 % (01/31 0336) Last BM Date: 01/29/18  Intake/Output from previous day: 01/30 0701 - 01/31 0700 In: 2060 [P.O.:960; I.V.:1100] Out: 10 [Blood:10] Intake/Output this shift: Total I/O In: 360 [P.O.:360] Out: -   PE: Gen:  Alert, NAD, pleasant Card:  Regular rate and rhythm, pedal pulses 2+ BL Pulm:  Normal effort, clear to auscultation bilaterally Abd: Soft, non-tender, non-distended, bowel sounds present, no HSM GU: R groin incision c/d/i, no scrotal swelling Skin: warm and dry, no rashes    Lab Results:  Recent Labs    01/29/18 0322 01/31/18 0353  WBC 5.8 12.6*  HGB 12.9* 13.0  HCT 42.4 40.9  PLT 197 220   BMET No results for input(s): NA, K, CL, CO2, GLUCOSE, BUN, CREATININE, CALCIUM in the last 72 hours. PT/INR No results for input(s): LABPROT, INR in the last 72 hours. CMP     Component Value Date/Time   NA 139 01/28/2018 0211   K 4.4 01/28/2018 0211   CL 105 01/28/2018 0211   CO2 25 01/28/2018 0211   GLUCOSE 106 (H) 01/28/2018 0211   BUN 9 01/28/2018 0211   CREATININE 0.92 01/28/2018 0211   CALCIUM 8.1 (L) 01/28/2018 0211   PROT 8.6 (H) 01/27/2018 0004   ALBUMIN 4.4 01/27/2018 0004   AST 22 01/27/2018 0004   ALT 21 01/27/2018 0004   ALKPHOS 107 01/27/2018 0004   BILITOT 0.5 01/27/2018 0004   GFRNONAA >60 01/28/2018 0211   GFRAA >60 01/28/2018 0211   Lipase     Component Value Date/Time   LIPASE 32 01/27/2018 0004        Studies/Results: No results found.  Anti-infectives: Anti-infectives (From admission, onward)   Start     Dose/Rate Route Frequency Ordered Stop   01/30/18 0600  ceFAZolin (ANCEF) IVPB 2g/100 mL premix     2 g 200 mL/hr over 30 Minutes Intravenous On call to O.R. 01/29/18 2029 01/30/18 1007       Assessment/Plan HTN Dementia COPD Hx of CVA  S/P RIH repair with mesh 01/30/18 Dr. Brantley Stage - POD#1 - tolerating diet - does not appear to be in any pain - recommend prn tylenol/ibuprofen and ice for post-operative pain management - have included post-op instructions and follow up for patient in chart - stable for discharge from a surgical perspective  FEN: reg diet VTE: lovenox ID: ancef preop  LOS: 4 days    Brigid Re , [facility] 01/31/2018, 8:59 AM Pager: [phone] Consults: [phone] Mon-Fri 7:00 am-4:30 pm Sat-Sun 7:00 am-11:30 am

## 2018-01-31 NOTE — NC FL2 (Signed)
[place] MEDICAID FL2 LEVEL OF CARE SCREENING TOOL     IDENTIFICATION  Patient Name: Jason [name] Birthdate: [birth year] Sex: male Admission Date (Current Location): 01/26/2018  [facility] and [place] Number:  Herbalist and Address:  [facility] [place], [place] [address], [place], [place] [address]      Provider Number: [id number]  Attending Physician Name and Address:  Oda Kilts, MD  Relative Name and Phone Number:  Stanton Kidney (sister) [phone]    Current Level of Care: Hospital Recommended Level of Care: [place] Prior Approval Number:    Date Approved/Denied: 02/27/13 PASRR Number: [id number]  Discharge Plan: SNF    Current Diagnoses: Patient Active Problem List   Diagnosis Date Noted  . Pressure injury of skin 01/30/2018  . Small bowel obstruction ([place]) 01/27/2018  . Inguinal hernia 01/27/2018  . Acute esophagitis 01/27/2018  . Hematemesis 01/27/2018  . Pneumonia 12/15/2015  . Acute encephalopathy 03/07/2013  . Postoperative intra-abdominal abscess 03/07/2013  . Hydronephrosis, bilateral 03/07/2013  . Pressure ulcer, stage KZS(010.93) 03/07/2013  . UTI (lower urinary tract infection) 02/18/2013  . Sepsis ([place]) 02/18/2013  . Aspiration pneumonia ([place]) 02/18/2013  . Acute respiratory failure with hypoxia ([place]) 02/18/2013  . Acute renal failure ([place]) 02/18/2013  . Dementia ([place]) 02/18/2013  . Hematuria 02/18/2013  . 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 Memory loss 03/27/2009  . CELLULITIS, HAND, LEFT 03/23/2009  . FATIGUE 03/15/2009  . ALCOHOL ABUSE 01/30/2008  . GOITER 10/25/2006  . NICOTINE ADDICTION 10/25/2006  . HYPERTENSION 10/25/2006  . COLONIC POLYPS, HX OF 10/25/2006  . BENIGN PROSTATIC HYPERTROPHY, HX OF 10/25/2006    Orientation RESPIRATION BLADDER Height & Weight     Self  Normal Incontinent, External catheter Weight: 74.7 kg Height:  5\' 8"  (172.7 cm)  BEHAVIORAL SYMPTOMS/MOOD  NEUROLOGICAL BOWEL NUTRITION STATUS      Continent Diet(see discharge summary)  AMBULATORY STATUS COMMUNICATION OF NEEDS Skin   Extensive Assist Verbally PU Stage and Appropriate Care, Surgical wounds(stage I pressure injury coccyx, abdomen right closed surgical incision)                       Personal Care Assistance Level of Assistance  Bathing, Feeding, Dressing, Total care Bathing Assistance: Maximum assistance Feeding assistance: Limited assistance(needs set up) Dressing Assistance: Maximum assistance Total Care Assistance: Maximum assistance   Functional Limitations Info  Sight, Hearing, Speech Sight Info: Adequate Hearing Info: Adequate Speech Info: Adequate    SPECIAL CARE FACTORS FREQUENCY  PT (By licensed PT), OT (By licensed OT)     PT Frequency: min 5x weekly OT Frequency: min 5x weekly            Contractures Contractures Info: Not present    Additional Factors Info  Code Status, Allergies Code Status Info: full Allergies Info: no known allergies           Current Medications (01/31/2018):  This is the current hospital active medication list Current Facility-Administered Medications  Medication Dose Route Frequency Provider Last Rate Last Dose  . acetaminophen (TYLENOL) tablet 650 mg  650 mg Oral Q6H PRN Maczis, Barth Kirks, PA-C       Or  . acetaminophen (TYLENOL) suppository 650 mg  650 mg Rectal Q6H PRN Jillyn Ledger, PA-C      . albuterol (PROVENTIL) (2.5 MG/3ML) 0.083% nebulizer solution 2.5 mg  2.5 mg Nebulization Q6H PRN Maczis, Barth Kirks, PA-C      . enoxaparin (LOVENOX) injection  40 mg  40 mg Subcutaneous Q24H Katherine Roan, MD      . ibuprofen (ADVIL,MOTRIN) tablet 600 mg  600 mg Oral Q6H PRN Maczis, Barth Kirks, PA-C      . morphine 2 MG/ML injection 2 mg  2 mg Intravenous Q2H PRN Maczis, Barth Kirks, PA-C      . oxyCODONE-acetaminophen (PERCOCET/ROXICET) 5-325 MG per tablet 1 tablet  1 tablet Oral Q4H PRN Cornett, Thomas, MD      .  pantoprazole (PROTONIX) injection 40 mg  40 mg Intravenous Daily Jillyn Ledger, PA-C   40 mg at 01/31/18 0846  . promethazine (PHENERGAN) injection 12.5 mg  12.5 mg Intravenous Q6H PRN Maczis, Barth Kirks, PA-C      . traMADol Veatrice Bourbon) tablet 50-100 mg  50-100 mg Oral Q6H PRN Jillyn Ledger, PA-C         Discharge Medications: Please see discharge summary for a list of discharge medications.  Relevant Imaging Results:  Relevant Lab Results:   Additional Information [id number]  Alberteen Sam, LCSW

## 2018-01-31 NOTE — Progress Notes (Signed)
   Subjective: No overnight events. Jason [name] is seen sitting up comfortably in bed with an empty tray of breakfast. He reports that he ate his whole breakfast. He denies pain or bowel movements.   Objective:  Vital signs in last 24 hours: Vitals:   01/30/18 1551 01/30/18 1942 01/31/18 0019 01/31/18 0336  BP: 129/72 118/66 123/77 (!) 141/71  Pulse: 100 99 92 80  Resp: 16 17 15 16   Temp: 97.7 F (36.5 C) 97.6 F (36.4 C) 98.6 F (37 C) 98.5 F (36.9 C)  TempSrc: Oral Oral Oral Oral  SpO2: 94% 97% 92% 100%  Weight:      Height:       Gen: sitting up comfortably in bed, no distress CV: RRR, no murmurs Pulm: ctab, normal effort  Abd: soft, bowel sounds present Genitals: hernia repair wound clean and dry, left side slightly protruding, non-tender at surgical site  Assessment/Plan:  Principal Problem:   Small bowel obstruction (HCC) Active Problems:   Dementia (HCC)   Inguinal hernia   Acute esophagitis   Hematemesis   Pressure injury of skin   Jason [name] is a4yo male PACE patient with dementia, copd, bladder cancer s/p resection who presents from a nursing facility with abdominal pain and coffee-ground emesis. Found to have SBO 2/2 incarcerated inguinal hernia which has now been successfully reduced.   SBO 2/2 Incarcerate Right Inguinal Hernia - Right inguinal hernia repair 8/50 - No complications - General surgery on board, appreciate recommendations - Tolerating PO well - Discharge today  Coffee ground emesis, esophagitis - No more emesis since admission - Hemodynamically stable - IV PPI, switch to oral at discharge -Phenerganprn   Dispo: Patient is medically stable for discharge back to his facility today.   [name], DO 01/31/2018, 6:54 AM Pager: [phone]

## 2020-02-04 IMAGING — CT CT ABD-PELV W/ CM
2 of 9 series · 15 of 46 positions shown, 17 images · IV contrast (APPLIED)
Comparison: CT abdomen pelvis 03/07/2013

CLINICAL DATA: Scrotal swelling

EXAM:
CT ABDOMEN AND PELVIS WITH CONTRAST
TECHNIQUE: Multidetector CT imaging of the abdomen and pelvis was performed
using the standard protocol following bolus administration of
intravenous contrast.
CONTRAST:  100mL XUMOJL-MWW IOPAMIDOL (XUMOJL-MWW) INJECTION 61%,
30mL XUMOJL-MWW IOPAMIDOL (XUMOJL-MWW) INJECTION 61%

[Series 2: axial st · axial · 0.82mm/px · z∈[-185,+245]mm · 12 of 102 slices shown, 14 images]
[im 8/102  soft-tissue]
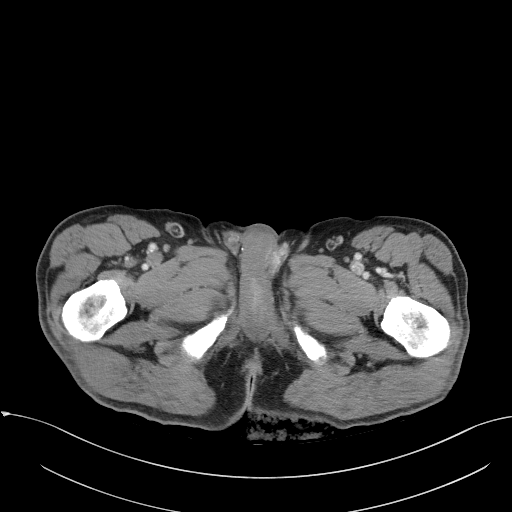
[im 8/102  bone]
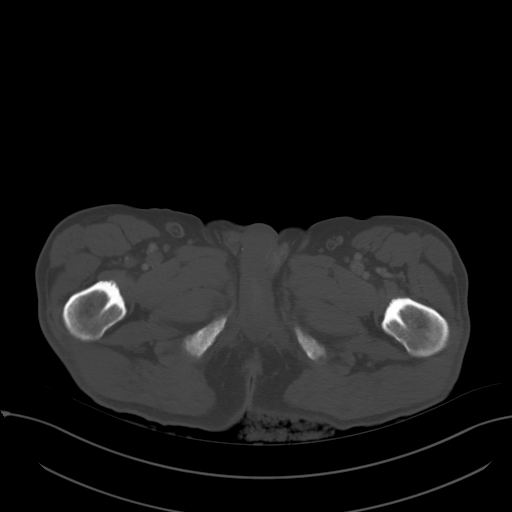
[im 16/102  soft-tissue]
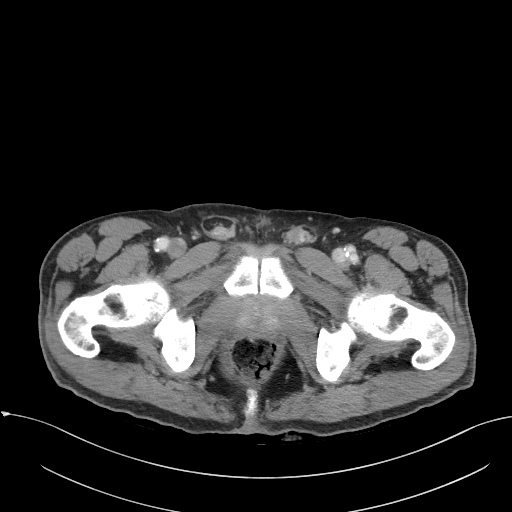
[im 24/102  soft-tissue]
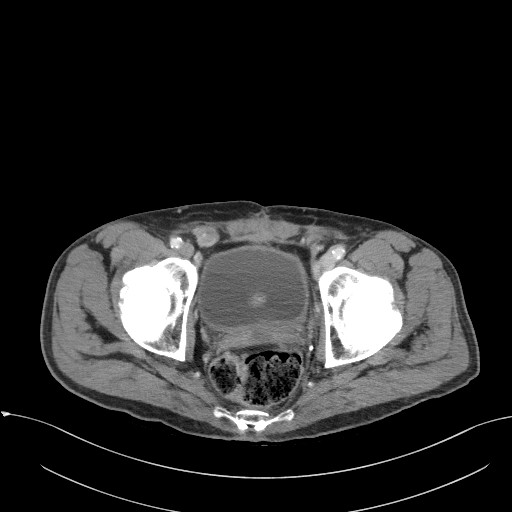
[im 32/102  soft-tissue]
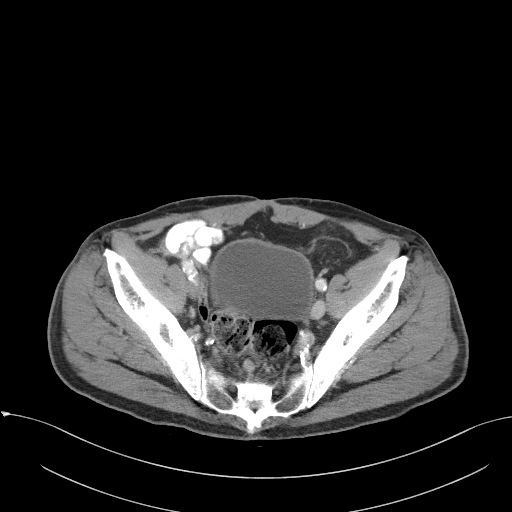
[im 39/102  soft-tissue]
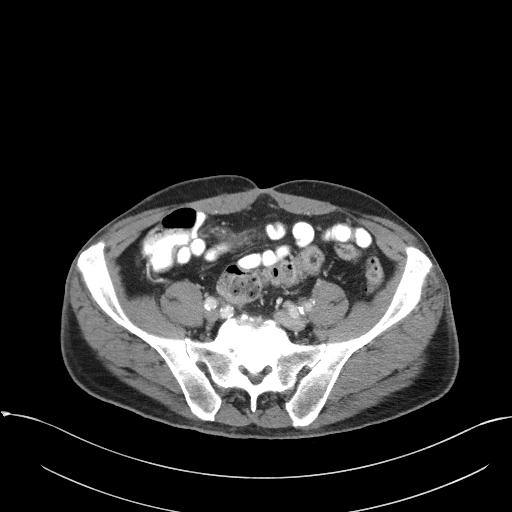
[im 47/102  soft-tissue]
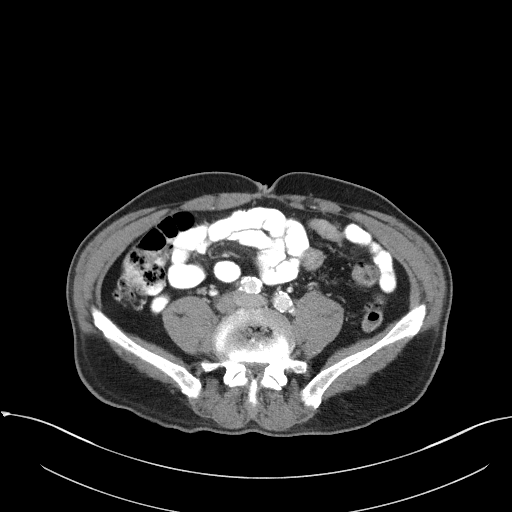
[im 55/102  soft-tissue]
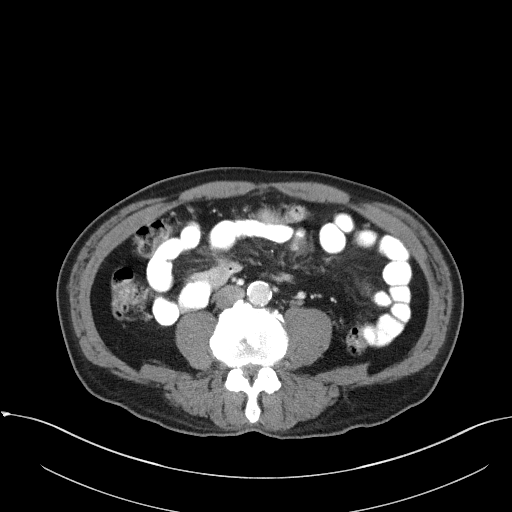
[im 63/102  soft-tissue]
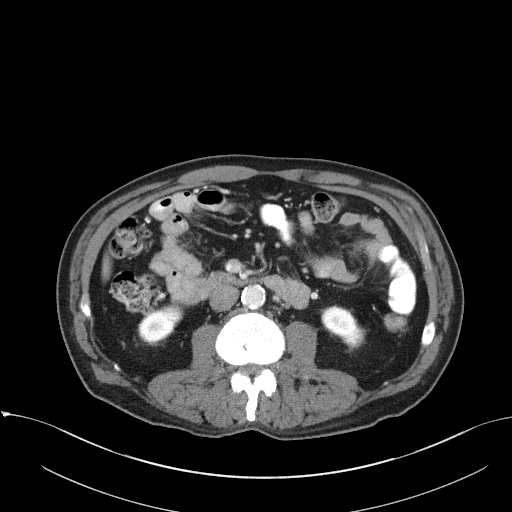
[im 70/102  soft-tissue]
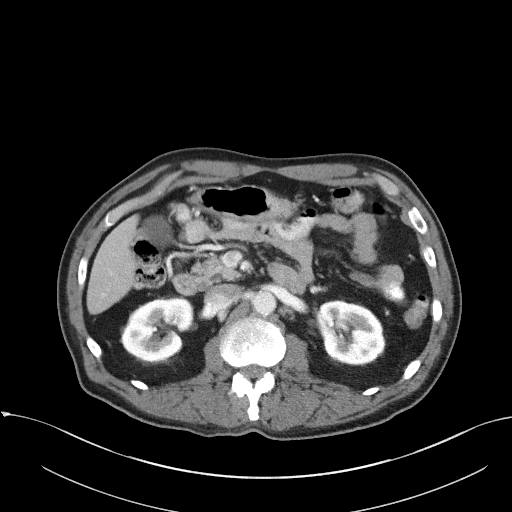
[im 70/102  bone]
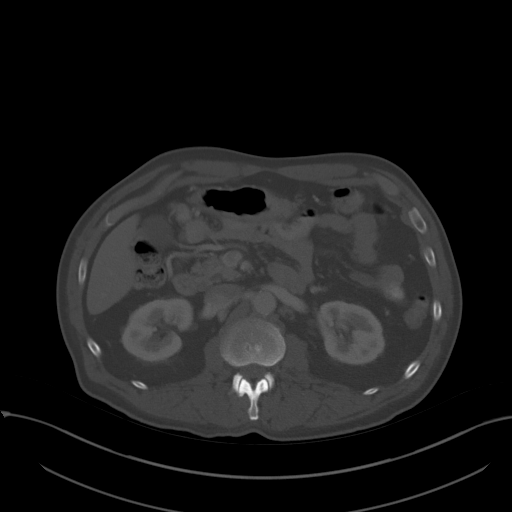
[im 78/102  soft-tissue]
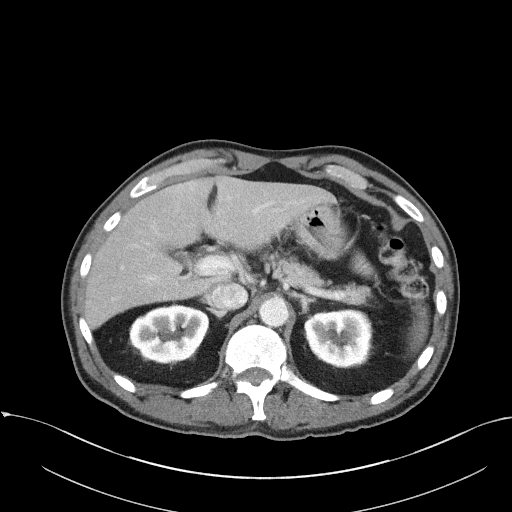
[im 86/102  soft-tissue]
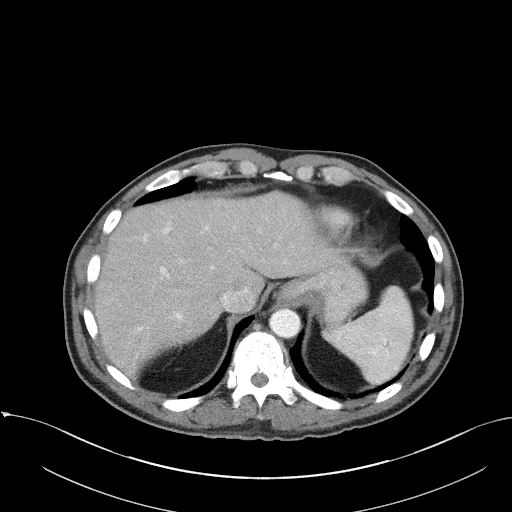
[im 94/102  soft-tissue]
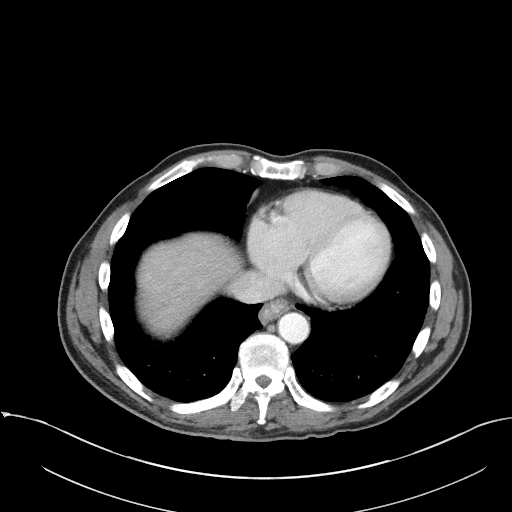

[Series 7: coronal st · coronal · 0.69mm/px · 3 of 100 slices shown]
[im 20/100  soft-tissue]
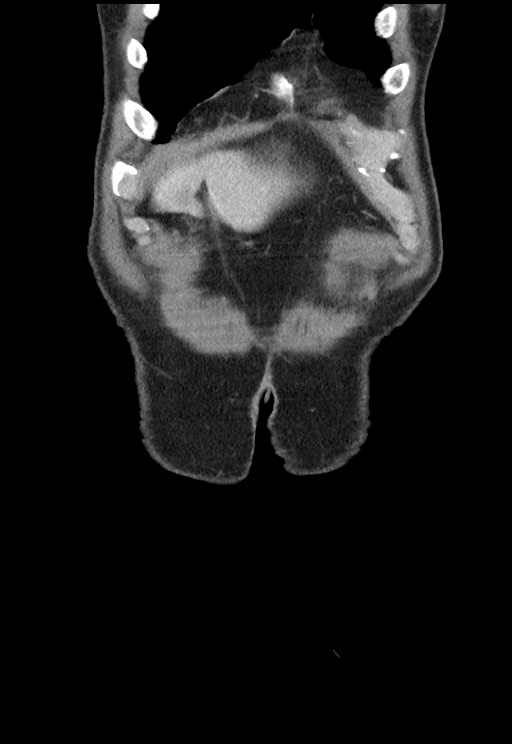
[im 40/100  soft-tissue]
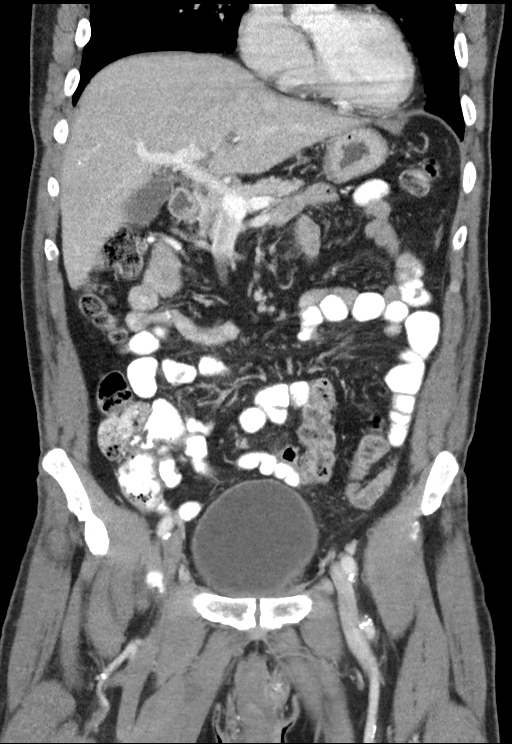
[im 60/100  soft-tissue]
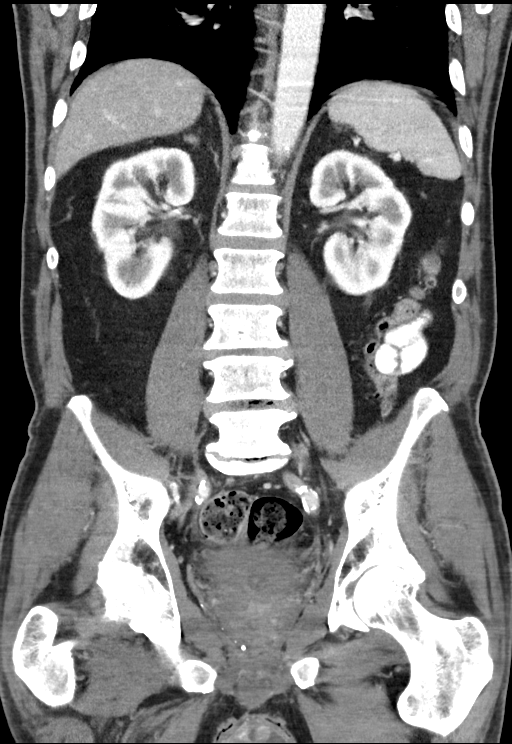

[15 of 46 positions shown; findings below may reference images not displayed]

FINDINGS: LOWER CHEST: No basilar pulmonary nodules or pleural effusion. No
apical pericardial effusion.

HEPATOBILIARY: Normal hepatic contours and density. No intra- or
extrahepatic biliary dilatation. Normal gallbladder.

PANCREAS: Normal parenchymal contours without ductal dilatation. No
peripancreatic fluid collection.

SPLEEN: Normal.

ADRENALS/URINARY TRACT:

--Adrenal glands: Normal.

--Right kidney/ureter: No hydronephrosis, nephroureterolithiasis,
perinephric stranding or solid renal mass.

--Left kidney/ureter: No hydronephrosis, nephroureterolithiasis,
perinephric stranding or solid renal mass.

--Urinary bladder: Normal for degree of distention

STOMACH/BOWEL:

--Stomach/Duodenum: No hiatal hernia or other gastric abnormality.
Normal duodenal course.

--Small bowel: No dilatation or inflammation.

--Colon: No focal abnormality.

--Appendix: Normal.

VASCULAR/LYMPHATIC: Atherosclerotic calcification is present within
the non-aneurysmal abdominal aorta, without hemodynamically
significant stenosis. The portal vein, splenic vein, superior
mesenteric vein and IVC are patent. No abdominal or pelvic
lymphadenopathy.

REPRODUCTIVE: Large bilateral hydroceles. The prostate is enlarged,
measuring 6.3 cm in transverse dimension. The contours of the
prostate gland are irregular with multiple nodular areas.

MUSCULOSKELETAL. Multilevel degenerative disc disease and facet
arthrosis. No bony spinal canal stenosis.

OTHER: None.
IMPRESSION: 1. Large bilateral scrotal hydroceles.
2. Enlarged and nodular prostate gland. Correlation with prostate
exam and PSA level recommended.
3.  Aortic Atherosclerosis ([id number]).

## 2020-05-20 ENCOUNTER — Other Ambulatory Visit: Payer: Self-pay

## 2020-05-20 ENCOUNTER — Encounter (HOSPITAL_COMMUNITY): Payer: Self-pay

## 2020-05-20 ENCOUNTER — Ambulatory Visit (HOSPITAL_COMMUNITY)
Admission: EM | Admit: 2020-05-20 | Discharge: 2020-05-20 | Disposition: A | Discharge status: Home / Self Care | Payer: Medicare (Managed Care) | Attending: Physician Assistant | Admitting: Physician Assistant

## 2020-05-20 DIAGNOSIS — N5089 Other specified disorders of the male genital organs: Secondary | ICD-10-CM | POA: Diagnosis not present

## 2020-05-20 DIAGNOSIS — Z8551 Personal history of malignant neoplasm of bladder: Secondary | ICD-10-CM | POA: Diagnosis not present

## 2020-05-20 LAB — CBC WITH DIFFERENTIAL/PLATELET
Abs Immature Granulocytes: 0 10*3/uL (ref 0.00–0.07)
Basophils Absolute: 0 10*3/uL (ref 0.0–0.1)
Basophils Relative: 1 %
Eosinophils Absolute: 0 10*3/uL (ref 0.0–0.5)
Eosinophils Relative: 1 %
HCT: 50.8 % (ref 39.0–52.0)
Hemoglobin: 15.3 g/dL (ref 13.0–17.0)
Immature Granulocytes: 0 %
Lymphocytes Relative: 40 %
Lymphs Abs: 1.9 10*3/uL (ref 0.7–4.0)
MCH: 21.8 pg — ABNORMAL LOW (ref 26.0–34.0)
MCHC: 30.1 g/dL (ref 30.0–36.0)
MCV: 72.3 fL — ABNORMAL LOW (ref 80.0–100.0)
Monocytes Absolute: 0.6 10*3/uL (ref 0.1–1.0)
Monocytes Relative: 12 %
Neutro Abs: 2.2 10*3/uL (ref 1.7–7.7)
Neutrophils Relative %: 46 %
Platelets: 312 10*3/uL (ref 150–400)
RBC: 7.03 MIL/uL — ABNORMAL HIGH (ref 4.22–5.81)
RDW: 18.6 % — ABNORMAL HIGH (ref 11.5–15.5)
WBC: 4.7 10*3/uL (ref 4.0–10.5)
nRBC: 0 % (ref 0.0–0.2)

## 2020-05-20 LAB — COMPREHENSIVE METABOLIC PANEL
ALT: 15 U/L (ref 0–44)
AST: 38 U/L (ref 15–41)
Albumin: 3.6 g/dL (ref 3.5–5.0)
Alkaline Phosphatase: 81 U/L (ref 38–126)
Anion gap: 7 (ref 5–15)
BUN: 17 mg/dL (ref 8–23)
CO2: 28 mmol/L (ref 22–32)
Calcium: 9.4 mg/dL (ref 8.9–10.3)
Chloride: 102 mmol/L (ref 98–111)
Creatinine, Ser: 1.07 mg/dL (ref 0.61–1.24)
GFR, Estimated: >60 mL/min (ref >60)
Glucose, Bld: 118 mg/dL — ABNORMAL HIGH (ref 70–99)
Potassium: 5.6 mmol/L — ABNORMAL HIGH (ref 3.5–5.1)
Sodium: 137 mmol/L (ref 135–145)
Total Bilirubin: 1.3 mg/dL — ABNORMAL HIGH (ref 0.3–1.2)
Total Protein: 8.3 g/dL — ABNORMAL HIGH (ref 6.5–8.1)

## 2020-05-20 NOTE — Discharge Instructions (Addendum)
We will be in touch with the lab results as soon as we have them.  I do think it is very important that you get imaging ASAP.  If he develops any pain or has any change in his bowel or bladder habits he needs to go to the emergency room.  Otherwise call PCP to schedule appointment to consider ultrasound and CT as we discussed.

## 2020-05-20 NOTE — ED Notes (Signed)
This RN provided chaperone with provider for genital exam.

## 2020-05-20 NOTE — ED Provider Notes (Signed)
[facility]    CSN: [id number] Arrival date & time: 05/20/20  [id number]      History   Chief Complaint Chief Complaint  Patient presents with  . Groin Swelling    HPI Jason [name] is a 75 y.o. male.   Patient has a history of dementia and so is unable to provide much history.  He is accompanied by his daughter who provides history.  Reports that she took him to his oncologist yesterday and noticed that he had significant scrotal swelling.  They did mention ordering a CT and that she did not know if this was done.  He was unable to give a urine specimen at that visit.  He does have a history of incarcerated hernia that was repaired 2020.  States he is not having any significant pain including abdominal pain, testicular pain, burning with urination, fever, nausea, vomiting.  He resides in a SNF and daughter reports there has been no report of changes in bowel or bladder habits.  Nursing staff does not mention scrotal swelling to daughter and so she is unsure how long this has been present.  He does have a history of bladder cancer.      Past Medical History:  Diagnosis Date  . Bladder mass 02-10-13   surgery planned for this  . COPD (chronic obstructive pulmonary disease) (HCC)    previous history and current smoking  . Dementia ([place]) 02/18/2013  . 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 Hypertension    States" never any meds for this"  . Right bundle branch block    history of this  . Stroke [facility])    Dementia, otherwise, no residual. 04-01-14 all resolved   . 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 Pressure injury of skin 01/30/2018  . Small bowel obstruction ([place]) 01/27/2018  . Inguinal hernia 01/27/2018  . Acute esophagitis 01/27/2018  . Hematemesis 01/27/2018  . Pneumonia 12/15/2015  . Acute encephalopathy 03/07/2013  . Postoperative intra-abdominal abscess 03/07/2013  .  Hydronephrosis, bilateral 03/07/2013  . Pressure ulcer, stage [id number]) 03/07/2013  . UTI (lower urinary tract infection) 02/18/2013  . Sepsis ([place]) 02/18/2013  . Aspiration pneumonia ([place]) 02/18/2013  . Acute respiratory failure with hypoxia ([place]) 02/18/2013  . Acute renal failure ([place]) 02/18/2013  . Dementia ([place]) 02/18/2013  . Hematuria 02/18/2013  . 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 Memory loss 03/27/2009  . CELLULITIS, HAND, LEFT 03/23/2009  . FATIGUE 03/15/2009  . ALCOHOL ABUSE 01/30/2008  . GOITER 10/25/2006  . NICOTINE ADDICTION 10/25/2006  . HYPERTENSION 10/25/2006  . COLONIC POLYPS, HX OF 10/25/2006  . BENIGN PROSTATIC HYPERTROPHY, HX OF 10/25/2006    Past Surgical History:  Procedure Laterality Date  . BALLOON DILATION N/A 04/07/2014   Procedure: BALLOON DILATION;  Surgeon: Ardis Hughs, MD;  Location: [facility];  Service: Urology;  Laterality: N/A;  . CYSTOSCOPY W/ RETROGRADES Bilateral 04/07/2014   Procedure: BILATERAL RETROGRADE PYELOGRAM;  Surgeon: Ardis Hughs, MD;  Location: [facility];  Service: Urology;  Laterality: Bilateral;  . CYSTOSCOPY/RETROGRADE/URETEROSCOPY Bilateral 02/13/2013   Procedure: BILATERAL RETROGRADE;  Surgeon: Ardis Hughs, MD;  Location: [facility];  Service: Urology;  Laterality: Bilateral;  . INGUINAL HERNIA REPAIR Right 01/30/2018   Procedure: OPEN RIGHT INGUINAL HERNIA REPAIR WITH MESH;  Surgeon: Erroll Luna, MD;  Location: [facility];  Service: General;  Laterality: Right;  . LAPAROTOMY N/A 02/18/2013   Procedure:  EXPLORATORY LAPAROTOMY drainage of retroperitoneal abscess, drainage of Pre-Peritoneal abscess and Exploration of bladder perforation;  Surgeon: Imogene Burn. Georgette Dover, MD;  Location: [facility];  Service: General;  Laterality: N/A;  . TRANSURETHRAL RESECTION OF BLADDER TUMOR WITH GYRUS (TURBT-GYRUS) N/A 02/13/2013   Procedure: TRANSURETHRAL RESECTION OF BLADDER TUMOR WITH GYRUS (TURBT-GYRUS), bladder biopsies;  Surgeon: Ardis Hughs, MD;  Location: [facility];  Service: Urology;  Laterality: N/A;       Home Medications    Prior to Admission medications   Medication Sig Start Date End Date Taking? Authorizing Provider  acetaminophen (TYLENOL) 325 MG tablet Take 2 tablets (650 mg total) by mouth every 6 (six) hours as needed for mild pain (or Fever >/= 101). 01/31/18   Rehman, Areeg N, DO  albuterol (PROVENTIL) (2.5 MG/3ML) 0.083% nebulizer solution Take 3 mLs (2.5 mg total) by nebulization every 4 (four) hours as needed for wheezing or shortness of breath. Patient not taking: Reported on 04/01/2014 03/11/13   Dellinger, Haynes Dage L, PA-C  collagenase (SANTYL) ointment Apply topically daily. Apply to sacral wound daily. Patient not taking: Reported on 04/01/2014 03/02/13   Jonetta Osgood, MD  docusate sodium (COLACE) 100 MG capsule Take 1 capsule (100 mg total) by mouth 2 (two) times daily as needed (take to keep stool soft.). Patient not taking: Reported on 01/27/2018 02/13/13   Ardis Hughs, MD  feeding supplement, GLUCERNA SHAKE, (GLUCERNA SHAKE) LIQD Take 237 mLs by mouth daily. Patient not taking: Reported on 12/13/2015 03/02/13   Jonetta Osgood, MD  Multiple Vitamin (MULTIVITAMIN WITH MINERALS) TABS tablet Take 1 tablet by mouth daily. Patient not taking: Reported on 01/27/2018 03/02/13   Jonetta Osgood, MD  pantoprazole (PROTONIX) 40 MG tablet Take 1 tablet (40 mg total) by mouth 2 (two) times daily for 30 days. Take 40 mg twice a day for one month and then continue to take 40 mg once a day. 01/31/18 03/02/18  Rehman, Areeg N, DO  promethazine (PHENERGAN) 25 MG tablet Take 25 mg by mouth every 6 (six) hours as needed for nausea or vomiting.    [provider]  sertraline (ZOLOFT) 50 MG tablet Take 50 mg by mouth daily.    [provider]  thiamine 100 MG tablet Take 1 tablet (100 mg total) by mouth daily. Patient not taking: Reported on 12/13/2015 03/02/13   Jonetta Osgood, MD    Family  History History reviewed. No pertinent family history.  Social History Social History   Tobacco Use  . Smoking status: Current Every Day Smoker    Packs/day: 1.00    Types: Cigarettes  . Smokeless tobacco: Never Used  Substance Use Topics  . Alcohol use: Yes    Comment: 2- 4 ounces  . Drug use: No     Allergies   Patient has no known allergies.   Review of Systems Review of Systems  Unable to perform ROS: Dementia  Constitutional: Negative for activity change, appetite change, fatigue and fever.  Gastrointestinal: Negative for abdominal pain, diarrhea, nausea and vomiting.  Genitourinary: Positive for scrotal swelling. Negative for dysuria, frequency, penile swelling and urgency.  Musculoskeletal: Negative for arthralgias and myalgias.  Neurological: Negative for headaches.   HPI per daughter   Physical Exam Triage Vital Signs ED Triage Vitals [05/20/20 0927]  Enc Vitals Group     BP (!) 144/81     Pulse Rate 74     Resp 20     Temp 97.9 F (36.6 C)  Temp Source Oral     SpO2 95 %     Weight      Height      Head Circumference      Peak Flow      Pain Score      Pain Loc      Pain Edu?      Excl. in [employer]?    No data found.  Updated Vital Signs BP (!) 144/81 (BP Location: Right Arm)   Pulse 74   Temp 97.9 F (36.6 C) (Oral)   Resp 20   SpO2 95%   Visual Acuity Right Eye Distance:   Left Eye Distance:   Bilateral Distance:    Right Eye Near:   Left Eye Near:    Bilateral Near:     Physical Exam Vitals reviewed. Exam conducted with a chaperone present.  Constitutional:      General: He is awake.     Appearance: Normal appearance. He is normal weight. He is not ill-appearing.     Comments: Very pleasant male appears stated age sitting comfortably in wheelchair  HENT:     Head: Normocephalic and atraumatic.     Mouth/Throat:     Pharynx: No oropharyngeal exudate or posterior oropharyngeal erythema.  Cardiovascular:     Rate and Rhythm:  Normal rate and regular rhythm.     Heart sounds: No murmur heard.   Pulmonary:     Effort: Pulmonary effort is normal.     Breath sounds: Normal breath sounds. No stridor. No wheezing, rhonchi or rales.     Comments: Clear auscultation bilaterally Abdominal:     General: A surgical scar is present. Bowel sounds are normal.     Palpations: Abdomen is soft.     Tenderness: There is no abdominal tenderness.     Hernia: There is no hernia in the left inguinal area or right inguinal area.     Comments: Benign abdominal exam  Genitourinary:    Penis: Uncircumcised.      Testes:        Right: Swelling present.        Left: Swelling present.     Comments: Significant swelling scrotum bilaterally.  No obvious inguinal hernia noted on exam.  No tenderness palpation.  No solitary mass noted but significant induration of left testicle/scrotum.  Normal-appearing penis without discharge.  [name], RN present as chaperone during exam Neurological:     Mental Status: He is alert.  Psychiatric:        Behavior: Behavior is cooperative.      UC Treatments / Results  Labs (all labs ordered are listed, but only abnormal results are displayed) Labs Reviewed  CBC WITH DIFFERENTIAL/PLATELET  COMPREHENSIVE METABOLIC PANEL    EKG   Radiology No results found.  Procedures Procedures (including critical care time)  Medications Ordered in UC Medications - No data to display  Initial Impression / Assessment and Plan / UC Course  I have reviewed the triage vital signs and the nursing notes.  Pertinent labs & imaging results that were available during my care of the patient were reviewed by me and considered in my medical decision making (see chart for details).      Discussed that we are unable to get imaging in office but patient will likely needs both scrotal ultrasound and abdominal CT.  Patient is currently asymptomatic and not experiencing any pain. CBC, CMP obtained today-results  pending.  Patient was unable to give [place] a urine specimen.  Patient is currently without any significant pain so we will try to arrange outpatient imaging through PCP but discussed that if anything worsens including if he develops any pain or has changes in bowel/bladder habits he is to go immediately to the ER to which daughter expressed understanding.  Daughter will contact PCP and oncologist immediately following visit today.  Strict return precautions given to which daughter expressed understanding.  Final Clinical Impressions(s) / UC Diagnoses   Final diagnoses:  Scrotal swelling  Enlarged testicle  History of bladder cancer     Discharge Instructions     We will be in touch with the lab results as soon as we have them.  I do think it is very important that you get imaging ASAP.  If he develops any pain or has any change in his bowel or bladder habits he needs to go to the emergency room.  Otherwise call PCP to schedule appointment to consider ultrasound and CT as we discussed.    ED Prescriptions    None     PDMP not reviewed this encounter.   Terrilee Croak, PA-C 05/20/20 1129

## 2020-05-20 NOTE — ED Triage Notes (Signed)
Pt presents with pain and swelling in the groin area.   Per daughter she noticed the groin swelling 1 day ago. Pt ca not recall for how long he is having the groin swelling as he have dementia.

## 2020-06-15 ENCOUNTER — Other Ambulatory Visit: Payer: Self-pay | Admitting: Vascular Surgery

## 2020-06-15 ENCOUNTER — Other Ambulatory Visit: Payer: Self-pay | Admitting: Nurse Practitioner

## 2020-06-15 DIAGNOSIS — R911 Solitary pulmonary nodule: Secondary | ICD-10-CM

## 2020-06-30 ENCOUNTER — Other Ambulatory Visit: Payer: Medicare (Managed Care)

## 2020-09-02 ENCOUNTER — Other Ambulatory Visit: Payer: Medicare (Managed Care)

## 2020-09-08 ENCOUNTER — Other Ambulatory Visit: Payer: Medicare (Managed Care)

## 2020-09-21 ENCOUNTER — Inpatient Hospital Stay: Admission: RE | Admit: 2020-09-21 | Payer: Medicare (Managed Care) | Source: Ambulatory Visit

## 2021-03-02 ENCOUNTER — Inpatient Hospital Stay (HOSPITAL_COMMUNITY): Payer: Medicare (Managed Care)

## 2021-03-02 ENCOUNTER — Encounter (HOSPITAL_COMMUNITY): Payer: Self-pay

## 2021-03-02 ENCOUNTER — Other Ambulatory Visit: Payer: Self-pay

## 2021-03-02 ENCOUNTER — Emergency Department (HOSPITAL_COMMUNITY): Payer: Medicare (Managed Care)

## 2021-03-02 ENCOUNTER — Other Ambulatory Visit (HOSPITAL_COMMUNITY): Payer: Medicare (Managed Care)

## 2021-03-02 ENCOUNTER — Inpatient Hospital Stay (HOSPITAL_COMMUNITY)
Admission: EM | Admit: 2021-03-02 | Discharge: 2021-03-20 | DRG: 853 | Disposition: A | Discharge status: Hospice | Payer: Medicare (Managed Care) | Source: Skilled Nursing Facility | Attending: Internal Medicine | Admitting: Internal Medicine

## 2021-03-02 ENCOUNTER — Encounter (HOSPITAL_COMMUNITY)
Admission: EM | Disposition: A | Discharge status: Hospice | Payer: Self-pay | Source: Skilled Nursing Facility | Attending: Internal Medicine

## 2021-03-02 DIAGNOSIS — T17908S Unspecified foreign body in respiratory tract, part unspecified causing other injury, sequela: Secondary | ICD-10-CM | POA: Diagnosis not present

## 2021-03-02 DIAGNOSIS — N390 Urinary tract infection, site not specified: Secondary | ICD-10-CM | POA: Diagnosis present

## 2021-03-02 DIAGNOSIS — K219 Gastro-esophageal reflux disease without esophagitis: Secondary | ICD-10-CM | POA: Diagnosis present

## 2021-03-02 DIAGNOSIS — Z1612 Extended spectrum beta lactamase (ESBL) resistance: Secondary | ICD-10-CM | POA: Diagnosis present

## 2021-03-02 DIAGNOSIS — Z4659 Encounter for fitting and adjustment of other gastrointestinal appliance and device: Secondary | ICD-10-CM

## 2021-03-02 DIAGNOSIS — Z6824 Body mass index (BMI) 24.0-24.9, adult: Secondary | ICD-10-CM

## 2021-03-02 DIAGNOSIS — Z79899 Other long term (current) drug therapy: Secondary | ICD-10-CM

## 2021-03-02 DIAGNOSIS — J440 Chronic obstructive pulmonary disease with acute lower respiratory infection: Secondary | ICD-10-CM | POA: Diagnosis present

## 2021-03-02 DIAGNOSIS — I480 Paroxysmal atrial fibrillation: Secondary | ICD-10-CM | POA: Diagnosis present

## 2021-03-02 DIAGNOSIS — G9341 Metabolic encephalopathy: Secondary | ICD-10-CM | POA: Diagnosis present

## 2021-03-02 DIAGNOSIS — F03C11 Unspecified dementia, severe, with agitation: Secondary | ICD-10-CM | POA: Diagnosis not present

## 2021-03-02 DIAGNOSIS — U071 COVID-19: Secondary | ICD-10-CM | POA: Diagnosis not present

## 2021-03-02 DIAGNOSIS — E860 Dehydration: Secondary | ICD-10-CM

## 2021-03-02 DIAGNOSIS — K7200 Acute and subacute hepatic failure without coma: Secondary | ICD-10-CM | POA: Diagnosis present

## 2021-03-02 DIAGNOSIS — E874 Mixed disorder of acid-base balance: Secondary | ICD-10-CM | POA: Diagnosis present

## 2021-03-02 DIAGNOSIS — A419 Sepsis, unspecified organism: Secondary | ICD-10-CM | POA: Diagnosis not present

## 2021-03-02 DIAGNOSIS — L89621 Pressure ulcer of left heel, stage 1: Secondary | ICD-10-CM | POA: Diagnosis present

## 2021-03-02 DIAGNOSIS — R0602 Shortness of breath: Secondary | ICD-10-CM | POA: Diagnosis present

## 2021-03-02 DIAGNOSIS — J9601 Acute respiratory failure with hypoxia: Secondary | ICD-10-CM

## 2021-03-02 DIAGNOSIS — N189 Chronic kidney disease, unspecified: Secondary | ICD-10-CM | POA: Diagnosis not present

## 2021-03-02 DIAGNOSIS — N179 Acute kidney failure, unspecified: Secondary | ICD-10-CM

## 2021-03-02 DIAGNOSIS — K4030 Unilateral inguinal hernia, with obstruction, without gangrene, not specified as recurrent: Secondary | ICD-10-CM | POA: Diagnosis present

## 2021-03-02 DIAGNOSIS — K460 Unspecified abdominal hernia with obstruction, without gangrene: Secondary | ICD-10-CM | POA: Diagnosis not present

## 2021-03-02 DIAGNOSIS — N136 Pyonephrosis: Secondary | ICD-10-CM | POA: Diagnosis present

## 2021-03-02 DIAGNOSIS — I129 Hypertensive chronic kidney disease with stage 1 through stage 4 chronic kidney disease, or unspecified chronic kidney disease: Secondary | ICD-10-CM | POA: Diagnosis not present

## 2021-03-02 DIAGNOSIS — F0390 Unspecified dementia without behavioral disturbance: Secondary | ICD-10-CM | POA: Diagnosis present

## 2021-03-02 DIAGNOSIS — E870 Hyperosmolality and hypernatremia: Secondary | ICD-10-CM | POA: Diagnosis not present

## 2021-03-02 DIAGNOSIS — Z8673 Personal history of transient ischemic attack (TIA), and cerebral infarction without residual deficits: Secondary | ICD-10-CM

## 2021-03-02 DIAGNOSIS — Z66 Do not resuscitate: Secondary | ICD-10-CM | POA: Diagnosis not present

## 2021-03-02 DIAGNOSIS — I10 Essential (primary) hypertension: Secondary | ICD-10-CM | POA: Diagnosis present

## 2021-03-02 DIAGNOSIS — E861 Hypovolemia: Secondary | ICD-10-CM | POA: Diagnosis present

## 2021-03-02 DIAGNOSIS — N170 Acute kidney failure with tubular necrosis: Secondary | ICD-10-CM | POA: Diagnosis present

## 2021-03-02 DIAGNOSIS — F17210 Nicotine dependence, cigarettes, uncomplicated: Secondary | ICD-10-CM | POA: Diagnosis present

## 2021-03-02 DIAGNOSIS — N35912 Unspecified bulbous urethral stricture, male: Secondary | ICD-10-CM | POA: Diagnosis present

## 2021-03-02 DIAGNOSIS — E440 Moderate protein-calorie malnutrition: Secondary | ICD-10-CM | POA: Diagnosis present

## 2021-03-02 DIAGNOSIS — R54 Age-related physical debility: Secondary | ICD-10-CM | POA: Diagnosis present

## 2021-03-02 DIAGNOSIS — I4510 Unspecified right bundle-branch block: Secondary | ICD-10-CM | POA: Diagnosis not present

## 2021-03-02 DIAGNOSIS — J150 Pneumonia due to Klebsiella pneumoniae: Secondary | ICD-10-CM | POA: Diagnosis present

## 2021-03-02 DIAGNOSIS — D509 Iron deficiency anemia, unspecified: Secondary | ICD-10-CM | POA: Diagnosis present

## 2021-03-02 DIAGNOSIS — I4891 Unspecified atrial fibrillation: Secondary | ICD-10-CM | POA: Clinically undetermined

## 2021-03-02 DIAGNOSIS — K56609 Unspecified intestinal obstruction, unspecified as to partial versus complete obstruction: Secondary | ICD-10-CM

## 2021-03-02 DIAGNOSIS — Z8551 Personal history of malignant neoplasm of bladder: Secondary | ICD-10-CM

## 2021-03-02 DIAGNOSIS — Z7189 Other specified counseling: Secondary | ICD-10-CM | POA: Diagnosis not present

## 2021-03-02 DIAGNOSIS — R1110 Vomiting, unspecified: Secondary | ICD-10-CM

## 2021-03-02 DIAGNOSIS — M7989 Other specified soft tissue disorders: Secondary | ICD-10-CM | POA: Diagnosis not present

## 2021-03-02 DIAGNOSIS — L8993 Pressure ulcer of unspecified site, stage 3: Secondary | ICD-10-CM | POA: Diagnosis present

## 2021-03-02 DIAGNOSIS — Z515 Encounter for palliative care: Secondary | ICD-10-CM

## 2021-03-02 DIAGNOSIS — R6521 Severe sepsis with septic shock: Secondary | ICD-10-CM | POA: Diagnosis present

## 2021-03-02 DIAGNOSIS — A4151 Sepsis due to Escherichia coli [E. coli]: Principal | ICD-10-CM | POA: Diagnosis present

## 2021-03-02 DIAGNOSIS — R739 Hyperglycemia, unspecified: Secondary | ICD-10-CM

## 2021-03-02 DIAGNOSIS — Z452 Encounter for adjustment and management of vascular access device: Secondary | ICD-10-CM

## 2021-03-02 DIAGNOSIS — J80 Acute respiratory distress syndrome: Secondary | ICD-10-CM | POA: Diagnosis present

## 2021-03-02 DIAGNOSIS — J690 Pneumonitis due to inhalation of food and vomit: Secondary | ICD-10-CM | POA: Diagnosis present

## 2021-03-02 DIAGNOSIS — R7989 Other specified abnormal findings of blood chemistry: Secondary | ICD-10-CM | POA: Diagnosis present

## 2021-03-02 DIAGNOSIS — Z789 Other specified health status: Secondary | ICD-10-CM

## 2021-03-02 DIAGNOSIS — D649 Anemia, unspecified: Secondary | ICD-10-CM | POA: Diagnosis present

## 2021-03-02 DIAGNOSIS — E876 Hypokalemia: Secondary | ICD-10-CM | POA: Diagnosis not present

## 2021-03-02 DIAGNOSIS — Z95828 Presence of other vascular implants and grafts: Secondary | ICD-10-CM

## 2021-03-02 DIAGNOSIS — Z20822 Contact with and (suspected) exposure to covid-19: Secondary | ICD-10-CM | POA: Diagnosis present

## 2021-03-02 DIAGNOSIS — T17908A Unspecified foreign body in respiratory tract, part unspecified causing other injury, initial encounter: Secondary | ICD-10-CM

## 2021-03-02 DIAGNOSIS — R1310 Dysphagia, unspecified: Secondary | ICD-10-CM | POA: Diagnosis present

## 2021-03-02 DIAGNOSIS — J9690 Respiratory failure, unspecified, unspecified whether with hypoxia or hypercapnia: Secondary | ICD-10-CM

## 2021-03-02 DIAGNOSIS — E875 Hyperkalemia: Secondary | ICD-10-CM | POA: Diagnosis present

## 2021-03-02 DIAGNOSIS — E162 Hypoglycemia, unspecified: Secondary | ICD-10-CM | POA: Diagnosis not present

## 2021-03-02 DIAGNOSIS — J9602 Acute respiratory failure with hypercapnia: Secondary | ICD-10-CM | POA: Diagnosis not present

## 2021-03-02 LAB — POCT I-STAT 7, (LYTES, BLD GAS, ICA,H+H)
Acid-base deficit: 8 mmol/L — ABNORMAL HIGH (ref 0.0–2.0)
Acid-base deficit: 6 mmol/L — ABNORMAL HIGH (ref 0.0–2.0)
Acid-base deficit: 5 mmol/L — ABNORMAL HIGH (ref 0.0–2.0)
Acid-base deficit: 6 mmol/L — ABNORMAL HIGH (ref 0.0–2.0)
Acid-base deficit: 6 mmol/L — ABNORMAL HIGH (ref 0.0–2.0)
Bicarbonate: 21.3 mmol/L (ref 20.0–28.0)
Bicarbonate: 19.2 mmol/L — ABNORMAL LOW (ref 20.0–28.0)
Bicarbonate: 19.6 mmol/L — ABNORMAL LOW (ref 20.0–28.0)
Bicarbonate: 21.6 mmol/L (ref 20.0–28.0)
Bicarbonate: 20 mmol/L (ref 20.0–28.0)
Calcium, Ion: 1.18 mmol/L (ref 1.15–1.40)
Calcium, Ion: 1.13 mmol/L — ABNORMAL LOW (ref 1.15–1.40)
Calcium, Ion: 1.08 mmol/L — ABNORMAL LOW (ref 1.15–1.40)
Calcium, Ion: 1.07 mmol/L — ABNORMAL LOW (ref 1.15–1.40)
Calcium, Ion: 1.02 mmol/L — ABNORMAL LOW (ref 1.15–1.40)
HCT: 46 % (ref 39.0–52.0)
HCT: 40 % (ref 39.0–52.0)
HCT: 38 % — ABNORMAL LOW (ref 39.0–52.0)
HCT: 41 % (ref 39.0–52.0)
HCT: 40 % (ref 39.0–52.0)
Hemoglobin: 15.6 g/dL (ref 13.0–17.0)
Hemoglobin: 13.6 g/dL (ref 13.0–17.0)
Hemoglobin: 12.9 g/dL — ABNORMAL LOW (ref 13.0–17.0)
Hemoglobin: 13.9 g/dL (ref 13.0–17.0)
Hemoglobin: 13.6 g/dL (ref 13.0–17.0)
O2 Saturation: 99 %
O2 Saturation: 84 %
O2 Saturation: 89 %
O2 Saturation: 87 %
O2 Saturation: 89 %
Patient temperature: 98.6
Patient temperature: 98.5
Patient temperature: 98.5
Patient temperature: 99.3
Patient temperature: 99.9
Potassium: 5.1 mmol/L (ref 3.5–5.1)
Potassium: 4.7 mmol/L (ref 3.5–5.1)
Potassium: 4.2 mmol/L (ref 3.5–5.1)
Potassium: 4.2 mmol/L (ref 3.5–5.1)
Potassium: 4.6 mmol/L (ref 3.5–5.1)
Sodium: 138 mmol/L (ref 135–145)
Sodium: 139 mmol/L (ref 135–145)
Sodium: 134 mmol/L — ABNORMAL LOW (ref 135–145)
Sodium: 137 mmol/L (ref 135–145)
Sodium: 137 mmol/L (ref 135–145)
TCO2: 23 mmol/L (ref 22–32)
TCO2: 20 mmol/L — ABNORMAL LOW (ref 22–32)
TCO2: 21 mmol/L — ABNORMAL LOW (ref 22–32)
TCO2: 23 mmol/L (ref 22–32)
TCO2: 21 mmol/L — ABNORMAL LOW (ref 22–32)
pCO2 arterial: 57 mmHg — ABNORMAL HIGH (ref 32–48)
pCO2 arterial: 37.2 mmHg (ref 32–48)
pCO2 arterial: 35.5 mmHg (ref 32–48)
pCO2 arterial: 50.7 mmHg — ABNORMAL HIGH (ref 32–48)
pCO2 arterial: 40.3 mmHg (ref 32–48)
pH, Arterial: 7.181 — CL (ref 7.35–7.45)
pH, Arterial: 7.32 — ABNORMAL LOW (ref 7.35–7.45)
pH, Arterial: 7.349 — ABNORMAL LOW (ref 7.35–7.45)
pH, Arterial: 7.239 — ABNORMAL LOW (ref 7.35–7.45)
pH, Arterial: 7.308 — ABNORMAL LOW (ref 7.35–7.45)
pO2, Arterial: 175 mmHg — ABNORMAL HIGH (ref 83–108)
pO2, Arterial: 52 mmHg — ABNORMAL LOW (ref 83–108)
pO2, Arterial: 58 mmHg — ABNORMAL LOW (ref 83–108)
pO2, Arterial: 64 mmHg — ABNORMAL LOW (ref 83–108)
pO2, Arterial: 65 mmHg — ABNORMAL LOW (ref 83–108)

## 2021-03-02 LAB — URINALYSIS, ROUTINE W REFLEX MICROSCOPIC
Bilirubin Urine: NEGATIVE
Glucose, UA: NEGATIVE mg/dL
Ketones, ur: NEGATIVE mg/dL
Nitrite: NEGATIVE
Protein, ur: 100 mg/dL — AB
Specific Gravity, Urine: 1.016 (ref 1.005–1.030)
pH: 5 (ref 5.0–8.0)

## 2021-03-02 LAB — I-STAT CHEM 8, ED
BUN: 54 mg/dL — ABNORMAL HIGH (ref 8–23)
Calcium, Ion: 1 mmol/L — ABNORMAL LOW (ref 1.15–1.40)
Chloride: 107 mmol/L (ref 98–111)
Creatinine, Ser: 3.3 mg/dL — ABNORMAL HIGH (ref 0.61–1.24)
Glucose, Bld: 276 mg/dL — ABNORMAL HIGH (ref 70–99)
HCT: 57 % — ABNORMAL HIGH (ref 39.0–52.0)
Hemoglobin: 19.4 g/dL — ABNORMAL HIGH (ref 13.0–17.0)
Potassium: 5.1 mmol/L (ref 3.5–5.1)
Sodium: 137 mmol/L (ref 135–145)
TCO2: 18 mmol/L — ABNORMAL LOW (ref 22–32)

## 2021-03-02 LAB — I-STAT ARTERIAL BLOOD GAS, ED
Acid-base deficit: 8 mmol/L — ABNORMAL HIGH (ref 0.0–2.0)
Bicarbonate: 22.8 mmol/L (ref 20.0–28.0)
Calcium, Ion: 1.2 mmol/L (ref 1.15–1.40)
HCT: 48 % (ref 39.0–52.0)
Hemoglobin: 16.3 g/dL (ref 13.0–17.0)
O2 Saturation: 100 %
Patient temperature: 101.1
Potassium: 4.3 mmol/L (ref 3.5–5.1)
Sodium: 137 mmol/L (ref 135–145)
TCO2: 25 mmol/L (ref 22–32)
pCO2 arterial: 71.4 mmHg (ref 32–48)
pH, Arterial: 7.121 — CL (ref 7.35–7.45)
pO2, Arterial: 392 mmHg — ABNORMAL HIGH (ref 83–108)

## 2021-03-02 LAB — HEMOGLOBIN A1C
Hgb A1c MFr Bld: 7.6 % — ABNORMAL HIGH (ref 4.8–5.6)
Mean Plasma Glucose: 171.42 mg/dL

## 2021-03-02 LAB — CBC
HCT: 40.2 % (ref 39.0–52.0)
Hemoglobin: 12.3 g/dL — ABNORMAL LOW (ref 13.0–17.0)
MCH: 21.9 pg — ABNORMAL LOW (ref 26.0–34.0)
MCHC: 30.6 g/dL (ref 30.0–36.0)
MCV: 71.7 fL — ABNORMAL LOW (ref 80.0–100.0)
Platelets: 310 10*3/uL (ref 150–400)
RBC: 5.61 MIL/uL (ref 4.22–5.81)
RDW: 15.8 % — ABNORMAL HIGH (ref 11.5–15.5)
WBC: 7 10*3/uL (ref 4.0–10.5)
nRBC: 0 % (ref 0.0–0.2)

## 2021-03-02 LAB — COMPREHENSIVE METABOLIC PANEL
ALT: 25 U/L (ref 0–44)
ALT: 22 U/L (ref 0–44)
AST: 43 U/L — ABNORMAL HIGH (ref 15–41)
AST: 39 U/L (ref 15–41)
Albumin: 3.9 g/dL (ref 3.5–5.0)
Albumin: 2.3 g/dL — ABNORMAL LOW (ref 3.5–5.0)
Alkaline Phosphatase: 109 U/L (ref 38–126)
Alkaline Phosphatase: 67 U/L (ref 38–126)
Anion gap: 24 — ABNORMAL HIGH (ref 5–15)
Anion gap: 17 — ABNORMAL HIGH (ref 5–15)
BUN: 55 mg/dL — ABNORMAL HIGH (ref 8–23)
BUN: 60 mg/dL — ABNORMAL HIGH (ref 8–23)
CO2: 14 mmol/L — ABNORMAL LOW (ref 22–32)
CO2: 19 mmol/L — ABNORMAL LOW (ref 22–32)
Calcium: 9.9 mg/dL (ref 8.9–10.3)
Calcium: 7.8 mg/dL — ABNORMAL LOW (ref 8.9–10.3)
Chloride: 99 mmol/L (ref 98–111)
Chloride: 102 mmol/L (ref 98–111)
Creatinine, Ser: 3.34 mg/dL — ABNORMAL HIGH (ref 0.61–1.24)
Creatinine, Ser: 3.36 mg/dL — ABNORMAL HIGH (ref 0.61–1.24)
GFR, Estimated: 18 mL/min — ABNORMAL LOW (ref >60)
GFR, Estimated: 18 mL/min — ABNORMAL LOW (ref >60)
Glucose, Bld: 270 mg/dL — ABNORMAL HIGH (ref 70–99)
Glucose, Bld: 231 mg/dL — ABNORMAL HIGH (ref 70–99)
Potassium: 5.5 mmol/L — ABNORMAL HIGH (ref 3.5–5.1)
Potassium: 4.6 mmol/L (ref 3.5–5.1)
Sodium: 137 mmol/L (ref 135–145)
Sodium: 138 mmol/L (ref 135–145)
Total Bilirubin: 0.6 mg/dL (ref 0.3–1.2)
Total Bilirubin: 1.4 mg/dL — ABNORMAL HIGH (ref 0.3–1.2)
Total Protein: 8.8 g/dL — ABNORMAL HIGH (ref 6.5–8.1)
Total Protein: 5.4 g/dL — ABNORMAL LOW (ref 6.5–8.1)

## 2021-03-02 LAB — CBC WITH DIFFERENTIAL/PLATELET
Abs Immature Granulocytes: 0 10*3/uL (ref 0.00–0.07)
Basophils Absolute: 0 10*3/uL (ref 0.0–0.1)
Basophils Relative: 0 %
Eosinophils Absolute: 0 10*3/uL (ref 0.0–0.5)
Eosinophils Relative: 0 %
HCT: 51.5 % (ref 39.0–52.0)
Hemoglobin: 16.3 g/dL (ref 13.0–17.0)
Lymphocytes Relative: 8 %
Lymphs Abs: 2.3 10*3/uL (ref 0.7–4.0)
MCH: 22.7 pg — ABNORMAL LOW (ref 26.0–34.0)
MCHC: 31.7 g/dL (ref 30.0–36.0)
MCV: 71.6 fL — ABNORMAL LOW (ref 80.0–100.0)
Monocytes Absolute: 2.3 10*3/uL — ABNORMAL HIGH (ref 0.1–1.0)
Monocytes Relative: 8 %
Neutro Abs: 24.5 10*3/uL — ABNORMAL HIGH (ref 1.7–7.7)
Neutrophils Relative %: 84 %
Platelets: 444 10*3/uL — ABNORMAL HIGH (ref 150–400)
RBC: 7.19 MIL/uL — ABNORMAL HIGH (ref 4.22–5.81)
RDW: 17.7 % — ABNORMAL HIGH (ref 11.5–15.5)
WBC: 29.2 10*3/uL — ABNORMAL HIGH (ref 4.0–10.5)
nRBC: 0 % (ref 0.0–0.2)
nRBC: 0 /100 WBC

## 2021-03-02 LAB — GLUCOSE, CAPILLARY
Glucose-Capillary: 143 mg/dL — ABNORMAL HIGH (ref 70–99)
Glucose-Capillary: 194 mg/dL — ABNORMAL HIGH (ref 70–99)
Glucose-Capillary: 198 mg/dL — ABNORMAL HIGH (ref 70–99)
Glucose-Capillary: 207 mg/dL — ABNORMAL HIGH (ref 70–99)
Glucose-Capillary: 220 mg/dL — ABNORMAL HIGH (ref 70–99)
Glucose-Capillary: 226 mg/dL — ABNORMAL HIGH (ref 70–99)

## 2021-03-02 LAB — PROTIME-INR
INR: 1.2 (ref 0.8–1.2)
Prothrombin Time: 15.3 seconds — ABNORMAL HIGH (ref 11.4–15.2)

## 2021-03-02 LAB — RESP PANEL BY RT-PCR (FLU A&B, COVID) ARPGX2
Influenza A by PCR: NEGATIVE
Influenza B by PCR: NEGATIVE
SARS Coronavirus 2 by RT PCR: NEGATIVE

## 2021-03-02 LAB — LACTIC ACID, PLASMA
Lactic Acid, Venous: 8.6 mmol/L (ref 0.5–1.9)
Lactic Acid, Venous: 6.5 mmol/L (ref 0.5–1.9)
Lactic Acid, Venous: 5.2 mmol/L (ref 0.5–1.9)

## 2021-03-02 LAB — APTT: aPTT: 28 seconds (ref 24–36)

## 2021-03-02 LAB — MRSA NEXT GEN BY PCR, NASAL: MRSA by PCR Next Gen: NOT DETECTED

## 2021-03-02 SURGERY — REPAIR, HERNIA, INGUINAL, INCARCERATED
Anesthesia: General

## 2021-03-02 MED ORDER — INSULIN ASPART 100 UNIT/ML IJ SOLN
0.0000 [IU] | INTRAMUSCULAR | Status: DC
  Administered 2021-03-02: 3 [IU] via SUBCUTANEOUS
  Administered 2021-03-02: 1 [IU] via SUBCUTANEOUS
  Administered 2021-03-02 (×2): 3 [IU] via SUBCUTANEOUS
  Administered 2021-03-03: 1 [IU] via SUBCUTANEOUS
  Administered 2021-03-03: 20:00:00 2 [IU] via SUBCUTANEOUS
  Administered 2021-03-03: 17:00:00 1 [IU] via SUBCUTANEOUS
  Administered 2021-03-03 (×2): 2 [IU] via SUBCUTANEOUS
  Administered 2021-03-03: 1 [IU] via SUBCUTANEOUS
  Administered 2021-03-03: 04:00:00 3 [IU] via SUBCUTANEOUS
  Administered 2021-03-04 – 2021-03-08 (×16): 1 [IU] via SUBCUTANEOUS
  Administered 2021-03-08 (×2): 2 [IU] via SUBCUTANEOUS
  Administered 2021-03-09: 09:00:00 1 [IU] via SUBCUTANEOUS
  Administered 2021-03-09: 12:00:00 2 [IU] via SUBCUTANEOUS
  Administered 2021-03-09: 04:00:00 1 [IU] via SUBCUTANEOUS
  Administered 2021-03-09 – 2021-03-10 (×3): 2 [IU] via SUBCUTANEOUS
  Administered 2021-03-10 (×2): 1 [IU] via SUBCUTANEOUS
  Administered 2021-03-10 – 2021-03-11 (×4): 2 [IU] via SUBCUTANEOUS

## 2021-03-02 MED ORDER — FENTANYL BOLUS VIA INFUSION
200.0000 ug | INTRAVENOUS | Status: DC | PRN
  Filled 2021-03-02: qty 200

## 2021-03-02 MED ORDER — EPINEPHRINE HCL 5 MG/250ML IV SOLN IN NS
0.5000 ug/min | INTRAVENOUS | Status: DC
  Administered 2021-03-02: 20 ug/min via INTRAVENOUS
  Administered 2021-03-02: 1 ug/min via INTRAVENOUS
  Filled 2021-03-02 (×2): qty 250

## 2021-03-02 MED ORDER — PANTOPRAZOLE SODIUM 40 MG IV SOLR
40.0000 mg | Freq: Every day | INTRAVENOUS | Status: DC
  Administered 2021-03-02 – 2021-03-08 (×7): 40 mg via INTRAVENOUS
  Filled 2021-03-02 (×7): qty 10

## 2021-03-02 MED ORDER — VECURONIUM BROMIDE 10 MG IV SOLR
10.0000 mg | Freq: Once | INTRAVENOUS | Status: AC
  Administered 2021-03-02: 10 mg via INTRAVENOUS
  Filled 2021-03-02: qty 10

## 2021-03-02 MED ORDER — SODIUM BICARBONATE 8.4 % IV SOLN
INTRAVENOUS | Status: DC
  Filled 2021-03-02 (×5): qty 1000

## 2021-03-02 MED ORDER — LACTATED RINGERS IV BOLUS
1000.0000 mL | Freq: Once | INTRAVENOUS | Status: AC
Start: 2021-03-02 — End: 2021-03-02
  Administered 2021-03-02: 1000 mL via INTRAVENOUS

## 2021-03-02 MED ORDER — ROCURONIUM BROMIDE 10 MG/ML (PF) SYRINGE
PREFILLED_SYRINGE | INTRAVENOUS | Status: AC
  Administered 2021-03-02: 80 mg via INTRAVENOUS
  Filled 2021-03-02: qty 10

## 2021-03-02 MED ORDER — LACTATED RINGERS IV BOLUS
1000.0000 mL | Freq: Once | INTRAVENOUS | Status: AC
  Administered 2021-03-02: 1000 mL via INTRAVENOUS

## 2021-03-02 MED ORDER — NOREPINEPHRINE 16 MG/250ML-% IV SOLN
2.0000 ug/min | INTRAVENOUS | Status: DC
  Administered 2021-03-02: 40 ug/min via INTRAVENOUS
  Administered 2021-03-03: 12 ug/min via INTRAVENOUS
  Administered 2021-03-03: 40 ug/min via INTRAVENOUS
  Filled 2021-03-02 (×3): qty 250

## 2021-03-02 MED ORDER — VANCOMYCIN HCL 1500 MG/300ML IV SOLN
1500.0000 mg | Freq: Once | INTRAVENOUS | Status: AC
  Administered 2021-03-02: 1500 mg via INTRAVENOUS
  Filled 2021-03-02: qty 300

## 2021-03-02 MED ORDER — SODIUM CHLORIDE 0.9 % IV SOLN
250.0000 mL | INTRAVENOUS | Status: DC
Start: 2021-03-02 — End: 2021-03-20
  Administered 2021-03-02 – 2021-03-12 (×5): 250 mL via INTRAVENOUS

## 2021-03-02 MED ORDER — LACTATED RINGERS IV SOLN: INTRAVENOUS | Status: DC

## 2021-03-02 MED ORDER — MIDAZOLAM HCL 2 MG/2ML IJ SOLN
4.0000 mg | INTRAMUSCULAR | Status: AC | PRN
  Administered 2021-03-05: 2 mg via INTRAVENOUS
  Filled 2021-03-02 (×2): qty 4

## 2021-03-02 MED ORDER — PIPERACILLIN-TAZOBACTAM 3.375 G IVPB: 3.3750 g | INTRAVENOUS | Status: DC

## 2021-03-02 MED ORDER — VASOPRESSIN 20 UNITS/100 ML INFUSION FOR SHOCK
0.0000 [IU]/min | INTRAVENOUS | Status: DC
  Administered 2021-03-02 – 2021-03-04 (×5): 0.03 [IU]/min via INTRAVENOUS
  Filled 2021-03-02 (×4): qty 100

## 2021-03-02 MED ORDER — FENTANYL 2500MCG IN NS 250ML (10MCG/ML) PREMIX INFUSION
INTRAVENOUS | Status: AC
  Administered 2021-03-02: 50 ug/h via INTRAVENOUS
  Filled 2021-03-02: qty 250

## 2021-03-02 MED ORDER — FENTANYL BOLUS VIA INFUSION
25.0000 ug | INTRAVENOUS | Status: DC | PRN
  Filled 2021-03-02: qty 100

## 2021-03-02 MED ORDER — FENTANYL 2500MCG IN NS 250ML (10MCG/ML) PREMIX INFUSION
0.0000 ug/h | INTRAVENOUS | Status: DC
  Administered 2021-03-02: 17:00:00 150 ug/h via INTRAVENOUS
  Administered 2021-03-05: 75 ug/h via INTRAVENOUS
  Administered 2021-03-06 – 2021-03-07 (×2): 100 ug/h via INTRAVENOUS
  Administered 2021-03-07: 125 ug/h via INTRAVENOUS
  Filled 2021-03-02 (×5): qty 250

## 2021-03-02 MED ORDER — PANTOPRAZOLE INFUSION (NEW) - SIMPLE MED
8.0000 mg/h | INTRAVENOUS | Status: DC
  Filled 2021-03-02: qty 100

## 2021-03-02 MED ORDER — CHLORHEXIDINE GLUCONATE CLOTH 2 % EX PADS
6.0000 | MEDICATED_PAD | Freq: Every day | CUTANEOUS | Status: DC
  Administered 2021-03-02 – 2021-03-20 (×18): 6 via TOPICAL

## 2021-03-02 MED ORDER — PIPERACILLIN-TAZOBACTAM IN DEX 2-0.25 GM/50ML IV SOLN
2.2500 g | Freq: Three times a day (TID) | INTRAVENOUS | Status: DC
  Administered 2021-03-02 – 2021-03-05 (×9): 2.25 g via INTRAVENOUS
  Filled 2021-03-02 (×10): qty 50

## 2021-03-02 MED ORDER — HEPARIN SODIUM (PORCINE) 5000 UNIT/ML IJ SOLN
5000.0000 [IU] | Freq: Three times a day (TID) | INTRAMUSCULAR | Status: DC
  Administered 2021-03-02 – 2021-03-10 (×24): 5000 [IU] via SUBCUTANEOUS
  Filled 2021-03-02 (×24): qty 1

## 2021-03-02 MED ORDER — ACETAMINOPHEN 650 MG RE SUPP
650.0000 mg | Freq: Once | RECTAL | Status: AC
  Administered 2021-03-02: 650 mg via RECTAL
  Filled 2021-03-02: qty 1

## 2021-03-02 MED ORDER — PANTOPRAZOLE 80MG IVPB - SIMPLE MED
80.0000 mg | Freq: Once | INTRAVENOUS | Status: DC
  Administered 2021-03-02: 80 mg via INTRAVENOUS
  Filled 2021-03-02: qty 80

## 2021-03-02 MED ORDER — MIDAZOLAM-SODIUM CHLORIDE 100-0.9 MG/100ML-% IV SOLN
0.0000 mg/h | INTRAVENOUS | Status: DC
  Administered 2021-03-02: 4 mg/h via INTRAVENOUS
  Filled 2021-03-02: qty 100

## 2021-03-02 MED ORDER — DOCUSATE SODIUM 50 MG/5ML PO LIQD: 100.0000 mg | Freq: Two times a day (BID) | ORAL | Status: DC

## 2021-03-02 MED ORDER — NOREPINEPHRINE 4 MG/250ML-% IV SOLN
INTRAVENOUS | Status: AC
  Administered 2021-03-02: 4 mg
  Filled 2021-03-02: qty 250

## 2021-03-02 MED ORDER — SODIUM CHLORIDE 0.9 % IV SOLN
2.0000 g | Freq: Once | INTRAVENOUS | Status: AC
  Administered 2021-03-02: 2 g via INTRAVENOUS
  Filled 2021-03-02: qty 2

## 2021-03-02 MED ORDER — VANCOMYCIN HCL IN DEXTROSE 1-5 GM/200ML-% IV SOLN: 1000.0000 mg | Freq: Once | INTRAVENOUS | Status: DC

## 2021-03-02 MED ORDER — POLYETHYLENE GLYCOL 3350 17 G PO PACK: 17.0000 g | PACK | Freq: Every day | ORAL | Status: DC

## 2021-03-02 MED ORDER — LACTATED RINGERS IV BOLUS (SEPSIS)
1000.0000 mL | Freq: Once | INTRAVENOUS | Status: AC
  Administered 2021-03-02: 1000 mL via INTRAVENOUS

## 2021-03-02 MED ORDER — MIDAZOLAM HCL 2 MG/2ML IJ SOLN: 1.0000 mg | INTRAMUSCULAR | Status: DC | PRN

## 2021-03-02 MED ORDER — ORAL CARE MOUTH RINSE
15.0000 mL | OROMUCOSAL | Status: DC
  Administered 2021-03-02 – 2021-03-10 (×74): 15 mL via OROMUCOSAL

## 2021-03-02 MED ORDER — FENTANYL 2500MCG IN NS 250ML (10MCG/ML) PREMIX INFUSION: 0.0000 ug/h | INTRAVENOUS | Status: DC

## 2021-03-02 MED ORDER — POLYETHYLENE GLYCOL 3350 17 G PO PACK: 17.0000 g | PACK | Freq: Every day | ORAL | Status: DC | PRN

## 2021-03-02 MED ORDER — SODIUM BICARBONATE 8.4 % IV SOLN
50.0000 meq | Freq: Once | INTRAVENOUS | Status: AC
Start: 2021-03-02 — End: 2021-03-02
  Administered 2021-03-02: 50 meq via INTRAVENOUS
  Filled 2021-03-02: qty 50

## 2021-03-02 MED ORDER — PIPERACILLIN-TAZOBACTAM 3.375 G IVPB 30 MIN
3.3750 g | INTRAVENOUS | Status: AC
  Administered 2021-03-02: 3.375 g via INTRAVENOUS
  Filled 2021-03-02: qty 50

## 2021-03-02 MED ORDER — DOCUSATE SODIUM 50 MG/5ML PO LIQD: 100.0000 mg | Freq: Two times a day (BID) | ORAL | Status: DC | PRN

## 2021-03-02 MED ORDER — DEXMEDETOMIDINE HCL IN NACL 400 MCG/100ML IV SOLN
0.0000 ug/kg/h | INTRAVENOUS | Status: DC
  Administered 2021-03-02: 0.4 ug/kg/h via INTRAVENOUS
  Administered 2021-03-02: 0.8 ug/kg/h via INTRAVENOUS
  Filled 2021-03-02 (×2): qty 100

## 2021-03-02 MED ORDER — MIDAZOLAM HCL 2 MG/2ML IJ SOLN: 2.0000 mg | INTRAMUSCULAR | Status: DC | PRN

## 2021-03-02 MED ORDER — CHLORHEXIDINE GLUCONATE 0.12% ORAL RINSE (MEDLINE KIT)
15.0000 mL | Freq: Two times a day (BID) | OROMUCOSAL | Status: DC
  Administered 2021-03-02 – 2021-03-20 (×28): 15 mL via OROMUCOSAL

## 2021-03-02 MED ORDER — MIDAZOLAM BOLUS VIA INFUSION
4.0000 mg | INTRAVENOUS | Status: DC | PRN
  Filled 2021-03-02: qty 4

## 2021-03-02 MED ORDER — MIDAZOLAM HCL 2 MG/2ML IJ SOLN
1.0000 mg | INTRAMUSCULAR | Status: DC | PRN
  Administered 2021-03-02: 1 mg via INTRAVENOUS
  Filled 2021-03-02: qty 2

## 2021-03-02 MED ORDER — METRONIDAZOLE 500 MG/100ML IV SOLN
500.0000 mg | Freq: Once | INTRAVENOUS | Status: DC
  Administered 2021-03-02: 500 mg via INTRAVENOUS
  Filled 2021-03-02: qty 100

## 2021-03-02 MED ORDER — ROCURONIUM BROMIDE 10 MG/ML (PF) SYRINGE: 80.0000 mg | PREFILLED_SYRINGE | Freq: Once | INTRAVENOUS | Status: AC

## 2021-03-02 MED ORDER — PANTOPRAZOLE SODIUM 40 MG IV SOLR: 40.0000 mg | Freq: Two times a day (BID) | INTRAVENOUS | Status: DC

## 2021-03-02 MED ORDER — ETOMIDATE 2 MG/ML IV SOLN
20.0000 mg | Freq: Once | INTRAVENOUS | Status: AC
  Administered 2021-03-02: 20 mg via INTRAVENOUS
  Filled 2021-03-02: qty 10

## 2021-03-02 MED ORDER — MIDAZOLAM HCL 2 MG/2ML IJ SOLN
2.0000 mg | Freq: Once | INTRAMUSCULAR | Status: AC
  Administered 2021-03-02: 2 mg via INTRAVENOUS
  Filled 2021-03-02: qty 2

## 2021-03-02 MED ORDER — NOREPINEPHRINE 4 MG/250ML-% IV SOLN
2.0000 ug/min | INTRAVENOUS | Status: DC
  Administered 2021-03-02: 2 ug/min via INTRAVENOUS
  Filled 2021-03-02 (×2): qty 250

## 2021-03-02 MED ORDER — LACTATED RINGERS IV BOLUS (SEPSIS)
250.0000 mL | Freq: Once | INTRAVENOUS | Status: AC
  Administered 2021-03-02: 250 mL via INTRAVENOUS

## 2021-03-02 NOTE — Procedures (Signed)
Arterial Catheter Insertion Procedure Note ? Jason [name]  ?[id number]  ?1945-07-23 ? ?Date:03/02/21  ?Time:3:04 PM  ? ? ?Provider Performing: [name]  ? ? ?Procedure: Insertion of Arterial Line [phone]) with US guidance ([id number])  ? ?Indication(s) ?Blood pressure monitoring and/or need for frequent ABGs ? ?Consent ?Risks of the procedure as well as the alternatives and risks of each were explained to the patient and/or caregiver.  Consent for the procedure was obtained and is signed in the bedside chart ? ?Anesthesia ?None ? ? ?Time Out ?Verified patient identification, verified procedure, site/side was marked, verified correct patient position, special equipment/implants available, medications/allergies/relevant history reviewed, required imaging and test results available. ? ? ?Sterile Technique ?Maximal sterile technique including full sterile barrier drape, hand hygiene, sterile gown, sterile gloves, mask, hair covering, sterile ultrasound probe cover (if used). ? ? ?Procedure Description ?Area of catheter insertion was cleaned with chlorhexidine and draped in sterile fashion. With real-time ultrasound guidance an arterial catheter was placed into the left  Axillary  artery.  Appropriate arterial tracings confirmed on monitor.   ? ? ?Complications/Tolerance ?None; patient tolerated the procedure well. ? ? ?EBL ?Minimal ? ? ?Specimen(s) ?None ? ?

## 2021-03-02 NOTE — H&P (Signed)
Jason [name], MRN:  [id number], DOB:  [birth year], LOS: 0 ADMISSION DATE:  03/02/2021, CONSULTATION DATE:  03/02/2021 REFERRING MD:  Veatrice Kells, MD CHIEF COMPLAINT:  SOB   History of Present Illness:  Pt is a 76 yo m w/ PMhx of dementia, bladder cancer presenting from SNF for SOB and emesis since early Tuesday AM. On arrival Jason [name] was found to be somnolent w/ rhonchus breathing, sats in low 70s. Immediately intubated by ED MD and found feculent emesis throughout oropharynx and airway. Upon OGT placement had ~ 1L of immediate output. Per reports pt had associated tachypnea, fever and emesis since early Tuesday AM as well. No family present.   CT ab/pelvis on my review concerning for pneumatosis & SBO involving left inguinal & scrotum. Final read pending. ED MD was able to reduce palpable anterior abdominal wall portion however scrotum remains full. ED nursing unable to pass foley despite multiple attempts.   Pertinent  Medical History  Dementia  ? Hx of Bladder cancer   Significant Hospital Events: Including procedures, antibiotic start and stop dates in addition to other pertinent events    Interim History / Subjective:   Objective   Blood pressure (!) 214/103, pulse (!) 42, temperature (!) 101.1 F (38.4 C), temperature source Rectal, resp. rate 18, height 5\' 8"  (1.727 m), weight 75 kg, SpO2 (!) 57 %.    Vent Mode: PRVC FiO2 (%):  [100 %] 100 % Set Rate:  [18 bmp-24 bmp] 24 bmp Vt Set:  [550 mL-580 mL] 580 mL PEEP:  [5 cmH20] 5 cmH20 Plateau Pressure:  [19 cmH20] 19 cmH20   Intake/Output Summary (Last 24 hours) at 03/02/2021 0558 Last data filed at 03/02/2021 0535 Gross per 24 hour  Intake --  Output 1000 ml  Net -1000 ml   Filed Weights   03/02/21 0504  Weight: 75 kg    Examination: General: intubated  HENT: ETT in place, OGT with dark feculent output  Lungs: course on vent  Cardiovascular: regular, tachycardic, no MRG  Abdomen: soft, distended, absent bowel sounds,  midline incision scar  Extremities: warm to touch, no LE edema  Neuro: intubated, received paralytic  GU: large, soft scrotum on left  Resolved Hospital Problem list    Assessment & Plan:  Acute hypoxic respiratory failure secondary to aspiration, encephalopathy  - lung protective ventilation  - hold SAT/SBT today  - sat goal 88-92%  - frequent suctioning  - repeat CXR for placement confirmation  - sedation with fentanyl, add precedex, RASS goal -2 - -3  - appears to be waking up, still under paralytic effects - give versed 2mg  IVP x1   SBO  - prior history of bowel incarceration secondary to right inguinal hernia, s/p mesh repair - continue OGT to LIS  - gen surg consult - may need urology involvement if unable to pass foley - coude team to attempt  - strict NPO  - continue to trend LA   Septic shock  - likely secondary to above, possibly some aspiration though CXR fairly under whelming at present   - c/w broad spec abx pending culture data  - received 2.5 L in ED, will continue with LR @ 100 ml/hr for 12 hours  - currently not requiring vasopressor support.  - check echo   AKI likely secondary to dehydration  - labs appear hemoconcentrated  - IVF as above  - strict IO  - foley placement as discussed above  - check a UA w/ culture reflex  when able   GERD - PPI   Dementia  - at baseline A&O x3 per daughter   [place] - per discussion with daughter, FULL code    Best Practice (right click and "Reselect all SmartList Selections" daily)   Diet/type: NPO DVT prophylaxis: LMWH GI prophylaxis: PPI Lines: N/A Foley:  Yes, and it is still needed Code Status:  full code Last date of multidisciplinary goals of care discussion []   Labs   CBC: Recent Labs  Lab 03/02/21 0512 03/02/21 0520 03/02/21 0552  WBC 29.2*  --   --   NEUTROABS PENDING  --   --   HGB 16.3 19.4* 16.3  HCT 51.5 57.0* 48.0  MCV 71.6*  --   --   PLT 444*  --   --     Basic Metabolic  Panel: Recent Labs  Lab 03/02/21 0520 03/02/21 0552  NA 137 137  K 5.1 4.3  CL 107  --   GLUCOSE 276*  --   BUN 54*  --   CREATININE 3.30*  --    GFR: Estimated Creatinine Clearance: 18.1 mL/min (A) (by C-G formula based on SCr of 3.3 mg/dL (H)). Recent Labs  Lab 03/02/21 0512  WBC 29.2*    Liver Function Tests: No results for input(s): AST, ALT, ALKPHOS, BILITOT, PROT, ALBUMIN in the last 168 hours. No results for input(s): LIPASE, AMYLASE in the last 168 hours. No results for input(s): AMMONIA in the last 168 hours.  ABG    Component Value Date/Time   PHART 7.121 (LL) 03/02/2021 0552   PCO2ART 71.4 (HH) 03/02/2021 0552   PO2ART 392 (H) 03/02/2021 0552   HCO3 22.8 03/02/2021 0552   TCO2 25 03/02/2021 0552   ACIDBASEDEF 8.0 (H) 03/02/2021 0552   O2SAT 100 03/02/2021 0552     Coagulation Profile: Recent Labs  Lab 03/02/21 0512  INR 1.2    Cardiac Enzymes: No results for input(s): CKTOTAL, CKMB, CKMBINDEX, TROPONINI in the last 168 hours.  HbA1C: Hgb A1c MFr Bld  Date/Time Value Ref Range Status  05/23/2009 01:36 PM 6.2 (H) <5.7 % Final    Comment:    See lab report for associated comment(s)    CBG: No results for input(s): GLUCAP in the last 168 hours.  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   Surgical History:   Past Surgical History:  Procedure Laterality Date   BALLOON DILATION N/A 04/07/2014   Procedure: BALLOON DILATION;  Surgeon: Ardis Hughs, MD;  Location: [facility];  Service: Urology;  Laterality: N/A;   CYSTOSCOPY W/ RETROGRADES Bilateral 04/07/2014   Procedure: BILATERAL RETROGRADE PYELOGRAM;  Surgeon: Ardis Hughs, MD;  Location: [facility];  Service: Urology;  Laterality: Bilateral;   CYSTOSCOPY/RETROGRADE/URETEROSCOPY Bilateral 02/13/2013   Procedure: BILATERAL RETROGRADE;  Surgeon: Ardis Hughs, MD;  Location: [facility];  Service: Urology;  Laterality: Bilateral;   INGUINAL HERNIA REPAIR Right 01/30/2018   Procedure: OPEN RIGHT INGUINAL HERNIA REPAIR WITH MESH;  Surgeon: Erroll Luna, MD;  Location: [facility];  Service: General;  Laterality: Right;   LAPAROTOMY N/A 02/18/2013   Procedure: EXPLORATORY LAPAROTOMY drainage of retroperitoneal abscess, drainage of Pre-Peritoneal abscess and Exploration of bladder perforation;  Surgeon: Imogene Burn. Tsuei, MD;  Location: [facility];  Service: General;  Laterality: N/A;   TRANSURETHRAL RESECTION OF BLADDER TUMOR WITH GYRUS (TURBT-GYRUS) N/A 02/13/2013   Procedure: TRANSURETHRAL RESECTION OF BLADDER TUMOR WITH GYRUS (TURBT-GYRUS), bladder biopsies;  Surgeon: Ardis Hughs, MD;  Location: [facility];  Service: Urology;  Laterality: N/A;     Social History:   reports that Jason [name] has been smoking cigarettes. Jason [name] has been smoking an average of 1 pack per day. Jason [name] has never used smokeless tobacco. Jason [name] reports current alcohol use. Jason [name] reports that Jason [name] does not use drugs.   Family History:  His family history is not on file.   Allergies No Known Allergies   Home Medications  Prior to Admission medications   Medication Sig Start Date End Date Taking? Authorizing Provider  acetaminophen (TYLENOL) 325 MG tablet Take 2 tablets (650 mg total) by mouth every 6 (six) hours as needed for mild pain (or Fever >/= 101). 01/31/18   Rehman, Areeg N, DO  albuterol (PROVENTIL) (2.5 MG/3ML) 0.083% nebulizer solution Take 3 mLs (2.5 mg total) by nebulization every 4 (four) hours as needed for wheezing or shortness of breath. Patient not taking: Reported on 04/01/2014 03/11/13   Dellinger, Haynes Dage L, PA-C  collagenase (SANTYL) ointment Apply topically daily. Apply to sacral wound  daily. Patient not taking: Reported on 04/01/2014 03/02/13   Jonetta Osgood, MD  docusate sodium (COLACE) 100 MG capsule Take 1 capsule (100 mg total) by mouth 2 (two) times daily as needed (take to keep stool soft.). Patient not taking: Reported on 01/27/2018 02/13/13   Ardis Hughs, MD  feeding supplement, GLUCERNA SHAKE, (GLUCERNA SHAKE) LIQD Take 237 mLs by mouth daily. Patient not taking: Reported on 12/13/2015 03/02/13   Jonetta Osgood, MD  Multiple Vitamin (MULTIVITAMIN WITH MINERALS) TABS tablet Take 1 tablet by mouth daily. Patient not taking: Reported on 01/27/2018 03/02/13   Jonetta Osgood, MD  pantoprazole (PROTONIX) 40 MG tablet Take 1 tablet (40 mg total) by mouth 2 (two) times daily for 30 days. Take 40 mg twice a day for one month and then continue to take 40 mg once a day. 01/31/18 03/02/18  Rehman, Areeg N, DO  promethazine (PHENERGAN) 25 MG tablet Take 25 mg by mouth every 6 (six) hours as needed for nausea or vomiting.    [provider]  sertraline (ZOLOFT) 50 MG tablet Take 50 mg by mouth daily.    [provider]  thiamine 100 MG tablet Take 1 tablet (100 mg total) by mouth daily. Patient not taking: Reported on 12/13/2015 03/02/13   Jonetta Osgood, MD     Critical care time: 60 minutes   Critical care time was exclusive of separately billable procedures and treating other patients.  Critical care was necessary to treat or prevent imminent or life-threatening deterioration.  Critical care was time spent personally by me on the following activities: development of treatment plan with patient and/or surrogate as well as nursing, discussions with consultants, evaluation of patient's response to treatment, examination of patient, obtaining history from patient or surrogate, ordering and performing treatments and interventions, ordering and review of laboratory studies, ordering and review of radiographic studies, pulse oximetry and re-evaluation of  patient's condition.

## 2021-03-02 NOTE — Consult Note (Signed)
Reason for Consult: Incarcerated inguinal hernia Referring Physician: Dr. Sander Radon Jason [name] is an 76 y.o. male.  HPI: Patient is a 76 year old male who comes in from outside long-term living facility-[facility].  Patient currently intubated, history is obtained via chart review.   Patient has a history of bladder cancer, COPD, dementia, hypertension history of CVA, who comes in secondary to 2 days history of nausea vomiting.  Patient with audible breathing and SPO2 of 72% at the facility with tachycardia.  Patient was then transferred to [facility] ER secondary to sepsis.  Upon evaluation the ER patient underwent CT scan.  This was significant for incarcerated left inguinal hernia.  There is questionable pneumatosis, strangulation.  This was reduced by the EDP prior to my arrival and examination to the patient.  Patient does have a midline laparotomy incision.  This is likely secondary to his bladder cancer.  Patient also with respiratory and metabolic acidosis.  Patient currently intubated.  Patient with hyperkalemia, elevated creatinine.  Patient with base deficit of 8.  With a pH of 7.121.  Patient's WBC count 29.2.  I did review the patient's CT scan and laboratory studies personally.  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    Past Surgical History:  Procedure Laterality Date   BALLOON DILATION N/A 04/07/2014   Procedure: BALLOON DILATION;  Surgeon: Ardis Hughs, MD;  Location: [facility];  Service: Urology;  Laterality: N/A;    CYSTOSCOPY W/ RETROGRADES Bilateral 04/07/2014   Procedure: BILATERAL RETROGRADE PYELOGRAM;  Surgeon: Ardis Hughs, MD;  Location: [facility];  Service: Urology;  Laterality: Bilateral;   CYSTOSCOPY/RETROGRADE/URETEROSCOPY Bilateral 02/13/2013   Procedure: BILATERAL RETROGRADE;  Surgeon: Ardis Hughs, MD;  Location: [facility];  Service: Urology;  Laterality: Bilateral;   INGUINAL HERNIA REPAIR Right 01/30/2018   Procedure: OPEN RIGHT INGUINAL HERNIA REPAIR WITH MESH;  Surgeon: Erroll Luna, MD;  Location: [place];  Service: General;  Laterality: Right;   LAPAROTOMY N/A 02/18/2013   Procedure: EXPLORATORY LAPAROTOMY drainage of retroperitoneal abscess, drainage of Pre-Peritoneal abscess and Exploration of bladder perforation;  Surgeon: Imogene Burn. Georgette [place], MD;  Location: [place];  Service: General;  Laterality: N/A;   TRANSURETHRAL RESECTION OF BLADDER TUMOR WITH GYRUS (TURBT-GYRUS) N/A 02/13/2013   Procedure: TRANSURETHRAL RESECTION OF BLADDER TUMOR WITH GYRUS (TURBT-GYRUS), bladder biopsies;  Surgeon: Ardis Hughs, MD;  Location: [facility];  Service: Urology;  Laterality: N/A;    History reviewed. No pertinent family history.  Social History:  reports that he has been smoking cigarettes. He has been smoking an average of 1 pack per day. He has never used smokeless tobacco. He reports current alcohol use. He reports that he does not use drugs.  Allergies: No Known Allergies  Medications: I have reviewed the patient's current medications.  Results for orders placed or performed during the hospital encounter of 03/02/21 (from the past 48 hour(s))  Resp Panel by RT-PCR (Flu A&B, Covid) Nasopharyngeal Swab     Status: None  Collection Time: 03/02/21  5:02 AM   Specimen: Nasopharyngeal Swab; Nasopharyngeal(NP) swabs in vial transport medium  Result Value Ref Range   SARS Coronavirus 2 by RT PCR NEGATIVE NEGATIVE    Comment: (NOTE) SARS-CoV-2 target nucleic acids are NOT DETECTED.  The  SARS-CoV-2 RNA is generally detectable in upper respiratory specimens during the acute phase of infection. The lowest concentration of SARS-CoV-2 viral copies this assay can detect is 138 copies/mL. A negative result does not preclude SARS-Cov-2 infection and should not be used as the sole basis for treatment or other patient management decisions. A negative result may occur with  improper specimen collection/handling, submission of specimen other than nasopharyngeal swab, presence of viral mutation(s) within the areas targeted by this assay, and inadequate number of viral copies(<138 copies/mL). A negative result must be combined with clinical observations, patient history, and epidemiological information. The expected result is Negative.  Fact Sheet for Patients:  EntrepreneurPulse.com.au  Fact Sheet for Healthcare Providers:  IncredibleEmployment.be  This test is no t yet approved or cleared by the [place] FDA and  has been authorized for detection and/or diagnosis of SARS-CoV-2 by FDA under an Emergency Use Authorization (EUA). This EUA will remain  in effect (meaning this test can be used) for the duration of the COVID-19 declaration under Section 564(b)(1) of the Act, 21 U.S.C.section 360bbb-3(b)(1), unless the authorization is terminated  or revoked sooner.       Influenza A by PCR NEGATIVE NEGATIVE   Influenza B by PCR NEGATIVE NEGATIVE    Comment: (NOTE) The Xpert Xpress SARS-CoV-2/FLU/RSV plus assay is intended as an aid in the diagnosis of influenza from Nasopharyngeal swab specimens and should not be used as a sole basis for treatment. Nasal washings and aspirates are unacceptable for Xpert Xpress SARS-CoV-2/FLU/RSV testing.  Fact Sheet for Patients: EntrepreneurPulse.com.au  Fact Sheet for Healthcare Providers: IncredibleEmployment.be  This test is not yet approved or cleared by the  [place] FDA and has been authorized for detection and/or diagnosis of SARS-CoV-2 by FDA under an Emergency Use Authorization (EUA). This EUA will remain in effect (meaning this test can be used) for the duration of the COVID-19 declaration under Section 564(b)(1) of the Act, 21 U.S.C. section 360bbb-3(b)(1), unless the authorization is terminated or revoked.  Performed at [facility], [place] [address], [place], [place] [address]   Lactic acid, plasma     Status: Abnormal   Collection Time: 03/02/21  5:12 AM  Result Value Ref Range   Lactic Acid, Venous 8.6 (HH) 0.5 - 1.9 mmol/L    Comment: CRITICAL RESULT CALLED TO, READ BACK BY AND VERIFIED WITH:  Garey Ham, RN, [phone], 03/02/21, E. ADEDOKUN. Performed at [facility], [place] [address] [place], [place] [address]   Comprehensive metabolic panel     Status: Abnormal   Collection Time: 03/02/21  5:12 AM  Result Value Ref Range   Sodium 137 135 - 145 mmol/L   Potassium 5.5 (H) 3.5 - 5.1 mmol/L   Chloride 99 98 - 111 mmol/L   CO2 14 (L) 22 - 32 mmol/L   Glucose, Bld 270 (H) 70 - 99 mg/dL    Comment: Glucose reference range applies only to samples taken after fasting for at least 8 hours.   BUN 55 (H) 8 - 23 mg/dL   Creatinine, Ser 3.34 (H) 0.61 - 1.24 mg/dL   Calcium 9.9 8.9 - 10.3 mg/dL   Total Protein 8.8 (H) 6.5 - 8.1 g/dL  Albumin 3.9 3.5 - 5.0 g/dL   AST 43 (H) 15 - 41 U/L   ALT 25 0 - 44 U/L   Alkaline Phosphatase 109 38 - 126 U/L   Total Bilirubin 0.6 0.3 - 1.2 mg/dL   GFR, Estimated 18 (L) >60 mL/min    Comment: (NOTE) Calculated using the CKD-EPI Creatinine Equation (2021)    Anion gap 24 (H) 5 - 15    Comment: REPEATED TO VERIFY Performed at [facility] [address] [place], [place] [address]   CBC WITH DIFFERENTIAL     Status: Abnormal   Collection Time: 03/02/21  5:12 AM  Result Value Ref Range   WBC 29.2 (H) 4.0 - 10.5 K/uL   RBC 7.19 (H) 4.22 - 5.81 MIL/uL   Hemoglobin 16.3 13.0 - 17.0  g/dL   HCT 51.5 39.0 - 52.0 %   MCV 71.6 (L) 80.0 - 100.0 fL   MCH 22.7 (L) 26.0 - 34.0 pg   MCHC 31.7 30.0 - 36.0 g/dL   RDW 17.7 (H) 11.5 - 15.5 %   Platelets 444 (H) 150 - 400 K/uL   nRBC 0.0 0.0 - 0.2 %   Neutrophils Relative % 84 %   Neutro Abs 24.5 (H) 1.7 - 7.7 K/uL   Lymphocytes Relative 8 %   Lymphs Abs 2.3 0.7 - 4.0 K/uL   Monocytes Relative 8 %   Monocytes Absolute 2.3 (H) 0.1 - 1.0 K/uL   Eosinophils Relative 0 %   Eosinophils Absolute 0.0 0.0 - 0.5 K/uL   Basophils Relative 0 %   Basophils Absolute 0.0 0.0 - 0.1 K/uL   nRBC 0 0 /100 WBC   Abs Immature Granulocytes 0.00 0.00 - 0.07 K/uL    Comment: Performed at [facility], [address], [place], [place] [address]  Protime-INR     Status: Abnormal   Collection Time: 03/02/21  5:12 AM  Result Value Ref Range   Prothrombin Time 15.3 (H) 11.4 - 15.2 seconds   INR 1.2 0.8 - 1.2    Comment: (NOTE) INR goal varies based on device and disease states. Performed at [facility], [place] [address], [place], [place] [address]   APTT     Status: None   Collection Time: 03/02/21  5:12 AM  Result Value Ref Range   aPTT 28 24 - 36 seconds    Comment: Performed at [facility] [address], [place], [place] [address]  I-stat chem 8, ED (not at [facility] or [facility])     Status: Abnormal   Collection Time: 03/02/21  5:20 AM  Result Value Ref Range   Sodium 137 135 - 145 mmol/L   Potassium 5.1 3.5 - 5.1 mmol/L   Chloride 107 98 - 111 mmol/L   BUN 54 (H) 8 - 23 mg/dL   Creatinine, Ser 3.30 (H) 0.61 - 1.24 mg/dL   Glucose, Bld 276 (H) 70 - 99 mg/dL    Comment: Glucose reference range applies only to samples taken after fasting for at least 8 hours.   Calcium, Ion 1.00 (L) 1.15 - 1.40 mmol/L   TCO2 18 (L) 22 - 32 mmol/L   Hemoglobin 19.4 (H) 13.0 - 17.0 g/dL   HCT 57.0 (H) 39.0 - 52.0 %  I-Stat arterial blood gas, ED     Status: Abnormal   Collection Time: 03/02/21  5:52 AM  Result Value Ref Range   pH, Arterial  7.121 (LL) 7.35 - 7.45  pCO2 arterial 71.4 (HH) 32 - 48 mmHg   pO2, Arterial 392 (H) 83 - 108 mmHg   Bicarbonate 22.8 20.0 - 28.0 mmol/L   TCO2 25 22 - 32 mmol/L   O2 Saturation 100 %   Acid-base deficit 8.0 (H) 0.0 - 2.0 mmol/L   Sodium 137 135 - 145 mmol/L   Potassium 4.3 3.5 - 5.1 mmol/L   Calcium, Ion 1.20 1.15 - 1.40 mmol/L   HCT 48.0 39.0 - 52.0 %   Hemoglobin 16.3 13.0 - 17.0 g/dL   Patient temperature 101.1 F    Collection site RADIAL, ALLEN'S TEST ACCEPTABLE    Drawn by RT    Sample type ARTERIAL    Comment NOTIFIED PHYSICIAN     DG Chest Portable 1 View  Result Date: 03/02/2021 CLINICAL DATA:  76 year old male intubated. EXAM: PORTABLE CHEST 1 VIEW COMPARISON:  Portable chest 0505 hours. FINDINGS: Portable AP supine view at 0532 hours. Thoracic inlet not included. An enteric tube courses to the abdomen, but the tip is not included. Larger lung volumes. But no endotracheal tube identified on this image. Coarse upper lobe reticulonodular opacity again noted. Elsewhere the lungs appear clear. Normal cardiac size and mediastinal contours. No acute osseous abnormality identified. IMPRESSION: 1. Thoracic inlet excluded and no endotracheal tube is visible. Recommend repeat portable chest radiograph. 2. Larger lung volumes with no acute pulmonary abnormality identified. 3. Enteric tube courses to the abdomen, tip not included. Electronically Signed   By: Genevie Ann M.D.   On: 03/02/2021 05:44   DG Chest Portable 1 View  Result Date: 03/02/2021 CLINICAL DATA:  76 year old male with cough, fever, vomiting. EXAM: PORTABLE CHEST 1 VIEW COMPARISON:  07/05/2017 chest radiographs and earlier. FINDINGS: Portable AP semi upright view at 0506 hours. Chronic lung disease with striking asymmetric reticulonodular opacity demonstrated on 2018 chest CT. Lung markings do not appear significantly changed from that time when allowing for portable technique. Mediastinal contours remain normal. No pneumothorax,  pleural effusion or consolidation. Background pulmonary vascularity appears normal. No acute osseous abnormality identified. Paucity of bowel gas in the upper abdomen. IMPRESSION: Chronic lung disease as demonstrated on a 2018 CT. No acute cardiopulmonary abnormality identified. Electronically Signed   By: Genevie Ann M.D.   On: 03/02/2021 05:34   CT Renal Stone Study  Result Date: 03/02/2021 CLINICAL DATA:  43-YEAR-OLD male with persistent fever, found down covered in vomit. Possible sepsis. Left inguinal hernia thought to be reduced following this scan. EXAM: CT ABDOMEN AND PELVIS WITHOUT CONTRAST TECHNIQUE: Multidetector CT imaging of the abdomen and pelvis was performed following the standard protocol without IV contrast. RADIATION DOSE REDUCTION: This exam was performed according to the departmental dose-optimization program which includes automated exposure control, adjustment of the mA and/or kV according to patient size and/or use of iterative reconstruction technique. COMPARISON:  CT Abdomen and Pelvis 06/10/2020.  Chest CT 05/02/2016. FINDINGS: Lower chest: Chronic lung base scarring, bronchial wall thickening and peribronchial nodularity appears stable from last year. No cardiomegaly, no pericardial effusion, no pleural effusion. However, the distal esophagus demonstrates circumferential wall thickening or alternatively fluid distension. There is an enteric tube passing through the esophagus and terminating in the proximal stomach. Hepatobiliary: Negative noncontrast liver and gallbladder. Pancreas: Negative. Spleen: Decreased spleen size from last year, otherwise negative. Adrenals/Urinary Tract: Negative adrenal glands. Nonobstructed kidneys with chronic renal vascular calcifications. Decompressed ureters. No acute pararenal inflammation. Diminutive bladder with some chronic wall thickening. Numerous pelvic phleboliths. Stomach/Bowel: Nondilated large bowel, decompressed throughout much  of the abdomen  and pelvis. Intermittent diverticulosis but no active large bowel inflammation. Decompressed terminal ileum and small bowel loops in the pelvis. Transition point at a small bowel containing left inguinal hernia, with upstream fluid-filled dilated small bowel loops up to 30 mm. Upstream small bowel is dilated to the ligament of Treitz. Enteric tube terminates in the stomach. Stomach and duodenum appear more normal. No free air. No free fluid in the abdomen. Vascular/Lymphatic: Extensive Aortoiliac calcified atherosclerosis. Normal caliber abdominal aorta. Vascular patency is not evaluated in the absence of IV contrast. No lymphadenopathy identified. Reproductive: Left inguinal hernia containing a small bowel loop and complex extraluminal fluid (coronal image 45). Possible pneumatosis within the herniated loop on coronal image 50, and mural thickening of the small bowel there. Other: No free fluid in the pelvis. Musculoskeletal: No acute osseous abnormality identified. Chronic lumbar spine degeneration. IMPRESSION: 1. Positive for Small-bowel Obstruction secondary to an incarcerated small bowel containing left inguinal hernia. Possible pneumatosis in the herniated bowel loop and complex free fluid in the hernia sac, suspicious for strangulation/ischemic bowel. Correlate with serum lactate. No free air or free fluid elsewhere in the abdomen or pelvis. This was discussed by telephone with Dr. Veatrice Kells on 03/02/2021 at 0625 hours. 2. Enteric tube terminates in the stomach which is relatively decompressed. But there is either generalized wall thickening or fluid distension of the distal esophagus. 3. Decompressed colon with intermittent diverticulosis. Stable lung bases with chronic lung disease. Extensive Aortic Atherosclerosis (ICD10-I70.0). Electronically Signed   By: Genevie Ann M.D.   On: 03/02/2021 06:37    Review of Systems  Unable to perform ROS: Acuity of condition  Blood pressure (!) 149/89, pulse (!) 152,  temperature 99.6 F (37.6 C), temperature source Rectal, resp. rate (!) 24, height 5\' 8"  (1.727 m), weight 75 kg, SpO2 100 %. Physical Exam Constitutional:      Appearance: He is well-developed.     Comments: intubated No acute distress  HENT:     Head: Normocephalic and atraumatic.  Eyes:     General: Lids are normal. No scleral icterus.    Pupils: Pupils are equal, round, and reactive to light.     Comments: Pupils are equal round and reactive No lid lag Moist conjunctiva  Neck:     Thyroid: No thyromegaly.     Trachea: No tracheal tenderness.     Comments: No cervical lymphadenopathy Cardiovascular:     Rate and Rhythm: Regular rhythm. Tachycardia present.     Heart sounds: No murmur heard. Pulmonary:     Effort: Pulmonary effort is normal.     Breath sounds: Normal breath sounds. No wheezing or rales.  Abdominal:     Tenderness: There is no abdominal tenderness.     Hernia: No hernia (lih reduced) is present.    Musculoskeletal:     Cervical back: Normal range of motion and neck supple.  Skin:    General: Skin is warm.     Findings: No rash.     Nails: There is no clubbing.     Comments: Normal skin turgor  Neurological:     Comments: Sedated     Assessment/Plan: Patient is a 76 year old male with acute respiratory failure currently intubated.  Patient with CT scan with incarcerated left inguinal hernia that is currently reduced. History of bladder cancer History of CVA History of hypertension History of COPD.  Plan: 1.  At this time the patient's hernia is currently reduced.  Difficult to assess whether  or not patient's current decline in status is due to his hernia versus possible aspiration/pneumonia. 2.  At this time patient with acidosis, hyperkalemia.  Patient at this time would not likely survive surgery.  Would recommend further resuscitation.  If patient's laboratory studies do not correct himself patient may benefit from a diagnostic laparoscopy.   Patient does have a history of bladder cancer and scar tissue as per his midline laparotomy incision.  This would likely not be a expeditious operation. We will continue to follow.  High medical decision making    Jason [name] 03/02/2021, 7:44 AM

## 2021-03-02 NOTE — Sepsis Progress Note (Signed)
Monitoring for the code sepsis protocol. °

## 2021-03-02 NOTE — H&P (Deleted)
NAMESASUKE YAFFE, MRN:  [id number], DOB:  10-20-1945, LOS: 0 ADMISSION DATE:  03/02/2021, CONSULTATION DATE:  03/02/2021 REFERRING MD:  Samara Deist, MD CHIEF COMPLAINT:  SOB   History of Present Illness:  Pt is a 76 yo m w/ PMhx of dementia, bladder cancer presenting from SNF for SOB and emesis since early Tuesday AM. On arrival he was found to be somnolent w/ rhonchus breathing, sats in low 70s. Immediately intubated by ED MD and found feculent emesis throughout oropharynx and airway. Upon OGT placement had ~ 1L of immediate output. Per reports pt had associated tachypnea, fever and emesis since early Tuesday AM as well. No family present.   CT ab/pelvis on my review concerning for pneumatosis & SBO involving left inguinal & scrotum. Final read pending. ED MD was able to reduce palpable anterior abdominal wall portion however scrotum remains full. ED nursing unable to pass foley despite multiple attempts.   Pertinent  Medical History  Dementia  ? Hx of Bladder cancer   Significant Hospital Events: Including procedures, antibiotic start and stop dates in addition to other pertinent events    Interim History / Subjective:   Objective   Blood pressure (!) 92/59, pulse (!) 113, temperature 99 F (37.2 C), temperature source Oral, resp. rate 14, height 5\' 8"  (1.727 m), weight 75 kg, SpO2 93 %.    Vent Mode: PSV;CPAP FiO2 (%):  [40 %-100 %] 50 % Set Rate:  [18 bmp-26 bmp] 26 bmp Vt Set:  [550 mL-580 mL] 580 mL PEEP:  [5 cmH20-8 cmH20] 8 cmH20 Pressure Support:  [15 cmH20] 15 cmH20 Plateau Pressure:  [19 cmH20-31 cmH20] 28 cmH20   Intake/Output Summary (Last 24 hours) at 03/02/2021 1833 Last data filed at 03/02/2021 1801 Gross per 24 hour  Intake 4874.82 ml  Output 1300 ml  Net 3574.82 ml    Filed Weights   03/02/21 0504  Weight: 75 kg    Examination: General: intubated  HENT: ETT in place, OGT with dark feculent output  Lungs: course on vent  Cardiovascular: regular,  tachycardic, no MRG  Abdomen: soft, distended, absent bowel sounds, midline incision scar  Extremities: warm to touch, no LE edema  Neuro: intubated, received paralytic  GU: large, soft scrotum on left  Resolved Hospital Problem list    Assessment & Plan:  Acute hypoxic respiratory failure secondary to aspiration, with developing ARDS, moderate with PF ratio less than 150 - l attempt lung protective ventilation, currently 8 cc/kg, difficult to ventilate due to vent dyssynchrony, ventilated quite well on pressure support but given developing ARDS, tidal volumes too high so placed on PRVC on 8 cc/kg as above --Midazolam drip, fentanyl drip, RASS goal -4-5 --Vecuronium x1 this evening -PaO2 goal 55-80 -ABG ordered after increasing sedation and paralysis x1  SBO with incarcerated hernia and evidence of ischemia/pneumatosis.  Reduced in the ED.  Surgery following.  Not a surgical candidate given critical illness. - prior history of bowel incarceration secondary to right inguinal hernia, s/p mesh repair - continue OGT to LIS  - gen surg consult - strict NPO  - continue to trend LA, trending down  Septic shock  -In the setting of aspiration pneumonia and ARDS - continue Zosyn - received 2.5 L in ED, additional 2 L given, appears volume replete on exam -MAP greater than 65, maxed on norepinephrine, added vaso, added epinephrine - check echo   Renal failure due to hypovolemia and septic shock -Sodium bicarbonate infusion -Foley placed by urology,  appreciate assistance - check a UA w/ culture reflex when able   GERD - PPI   Dementia  - at baseline A&O x3 per daughter, can ambulate, performs no ADLs, does not communicate, cannot speak but minimal speaking  GOC - per discussion with daughter, FULL code    Best Practice (right click and "Reselect all SmartList Selections" daily)   Diet/type: NPO DVT prophylaxis: LMWH GI prophylaxis: PPI Lines: N/A Foley:  Yes, and it is still  needed Code Status:  full code Last date of multidisciplinary goals of care discussion [03/02/21 with daughter, explained multi organ failure and refractory shock, worsening.  Worsening chest x-ray.  Recommended DNR.  She expressed understanding.  She stated she wants [place] to perform resuscitative efforts.]  Labs   CBC: Recent Labs  Lab 03/02/21 0512 03/02/21 0520 03/02/21 0552 03/02/21 0854 03/02/21 1401 03/02/21 1558 03/02/21 1658  WBC 29.2*  --   --   --   --  7.0  --   NEUTROABS 24.5*  --   --   --   --   --   --   HGB 16.3   < > 16.3 15.6 13.6 12.3* 12.9*  HCT 51.5   < > 48.0 46.0 40.0 40.2 38.0*  MCV 71.6*  --   --   --   --  71.7*  --   PLT 444*  --   --   --   --  310  --    < > = values in this interval not displayed.     Basic Metabolic Panel: Recent Labs  Lab 03/02/21 0512 03/02/21 0520 03/02/21 0552 03/02/21 0854 03/02/21 1401 03/02/21 1558 03/02/21 1658  NA 137 137 137 138 139 138 134*  K 5.5* 5.1 4.3 5.1 4.7 4.6 4.2  CL 99 107  --   --   --  102  --   CO2 14*  --   --   --   --  19*  --   GLUCOSE 270* 276*  --   --   --  231*  --   BUN 55* 54*  --   --   --  60*  --   CREATININE 3.34* 3.30*  --   --   --  3.36*  --   CALCIUM 9.9  --   --   --   --  7.8*  --     GFR: Estimated Creatinine Clearance: 17.8 mL/min (A) (by C-G formula based on SCr of 3.36 mg/dL (H)). Recent Labs  Lab 03/02/21 0512 03/02/21 0848 03/02/21 1437 03/02/21 1558  WBC 29.2*  --   --  7.0  LATICACIDVEN 8.6* 6.5* 5.2*  --      Liver Function Tests: Recent Labs  Lab 03/02/21 0512 03/02/21 1558  AST 43* 39  ALT 25 22  ALKPHOS 109 67  BILITOT 0.6 1.4*  PROT 8.8* 5.4*  ALBUMIN 3.9 2.3*   No results for input(s): LIPASE, AMYLASE in the last 168 hours. No results for input(s): AMMONIA in the last 168 hours.  ABG    Component Value Date/Time   PHART 7.349 (L) 03/02/2021 1658   PCO2ART 35.5 03/02/2021 1658   PO2ART 58 (L) 03/02/2021 1658   HCO3 19.6 (L) 03/02/2021 1658    TCO2 21 (L) 03/02/2021 1658   ACIDBASEDEF 5.0 (H) 03/02/2021 1658   O2SAT 89 03/02/2021 1658      Coagulation Profile: Recent Labs  Lab 03/02/21 0512  INR 1.2  Cardiac Enzymes: No results for input(s): CKTOTAL, CKMB, CKMBINDEX, TROPONINI in the last 168 hours.  HbA1C: Hgb A1c MFr Bld  Date/Time Value Ref Range Status  03/02/2021 05:12 AM 7.6 (H) 4.8 - 5.6 % Final    Comment:    (NOTE) Pre diabetes:          5.7%-6.4%  Diabetes:              >6.4%  Glycemic control for   <7.0% adults with diabetes   05/23/2009 01:36 PM 6.2 (H) <5.7 % Final    Comment:    See lab report for associated comment(s)    CBG: Recent Labs  Lab 03/02/21 0838 03/02/21 1141 03/02/21 1550 03/02/21 1715  GLUCAP 226* 143* 194* 220*    Review of Systems:   Unable to obtain due to intubation status   Past Medical History:  He,  has a past medical history of Bladder mass (02-10-13), COPD (chronic obstructive pulmonary disease) ([place]), Dementia ([place]) (02/18/2013), Goiter, toxic, 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   Surgical History:   Past Surgical History:  Procedure Laterality Date   BALLOON DILATION N/A 04/07/2014   Procedure: BALLOON DILATION;  Surgeon: Ardis Hughs, MD;  Location: [facility];  Service: Urology;  Laterality: N/A;   CYSTOSCOPY W/ RETROGRADES Bilateral 04/07/2014   Procedure: BILATERAL RETROGRADE PYELOGRAM;  Surgeon: Ardis Hughs, MD;  Location: [facility];  Service: Urology;  Laterality: Bilateral;   CYSTOSCOPY/RETROGRADE/URETEROSCOPY Bilateral 02/13/2013   Procedure: BILATERAL RETROGRADE;  Surgeon: Ardis Hughs, MD;  Location: [facility];  Service: Urology;  Laterality: Bilateral;   INGUINAL HERNIA REPAIR Right 01/30/2018   Procedure: OPEN RIGHT INGUINAL HERNIA REPAIR WITH MESH;  Surgeon: Erroll Luna, MD;  Location: [facility];  Service: General;  Laterality: Right;   LAPAROTOMY  N/A 02/18/2013   Procedure: EXPLORATORY LAPAROTOMY drainage of retroperitoneal abscess, drainage of Pre-Peritoneal abscess and Exploration of bladder perforation;  Surgeon: Imogene Burn. Georgette Dover, MD;  Location: [facility];  Service: General;  Laterality: N/A;   TRANSURETHRAL RESECTION OF BLADDER TUMOR WITH GYRUS (TURBT-GYRUS) N/A 02/13/2013   Procedure: TRANSURETHRAL RESECTION OF BLADDER TUMOR WITH GYRUS (TURBT-GYRUS), bladder biopsies;  Surgeon: Ardis Hughs, MD;  Location: [facility];  Service: Urology;  Laterality: N/A;     Social History:   reports that he has been smoking cigarettes. He has been smoking an average of 1 pack per day. He has never used smokeless tobacco. He reports current alcohol use. He reports that he does not use drugs.   Family History:  His family history is not on file.   Allergies No Known Allergies   Home Medications  Prior to Admission medications   Medication Sig Start Date End Date Taking? Authorizing Provider  acetaminophen (TYLENOL) 325 MG tablet Take 2 tablets (650 mg total) by mouth every 6 (six) hours as needed for mild pain (or Fever >/= 101). 01/31/18   Rehman, Areeg N, DO  albuterol (PROVENTIL) (2.5 MG/3ML) 0.083% nebulizer solution Take 3 mLs (2.5 mg total) by nebulization every 4 (four) hours as needed for wheezing or shortness of breath. Patient not taking: Reported on 04/01/2014 03/11/13   Dellinger, Haynes Dage L, PA-C  collagenase (SANTYL) ointment Apply topically daily. Apply to sacral wound daily. Patient not taking: Reported on 04/01/2014 03/02/13   Jonetta Osgood, MD  docusate sodium (COLACE) 100 MG capsule Take 1 capsule (100 mg total) by mouth 2 (two) times daily as needed (take to  keep stool soft.). Patient not taking: Reported on 01/27/2018 02/13/13   Ardis Hughs, MD  feeding supplement, GLUCERNA SHAKE, (GLUCERNA SHAKE) LIQD Take 237 mLs by mouth daily. Patient not taking: Reported on 12/13/2015 03/02/13   Jonetta Osgood, MD  Multiple Vitamin  (MULTIVITAMIN WITH MINERALS) TABS tablet Take 1 tablet by mouth daily. Patient not taking: Reported on 01/27/2018 03/02/13   Jonetta Osgood, MD  pantoprazole (PROTONIX) 40 MG tablet Take 1 tablet (40 mg total) by mouth 2 (two) times daily for 30 days. Take 40 mg twice a day for one month and then continue to take 40 mg once a day. 01/31/18 03/02/18  Rehman, Areeg N, DO  promethazine (PHENERGAN) 25 MG tablet Take 25 mg by mouth every 6 (six) hours as needed for nausea or vomiting.    [provider]  sertraline (ZOLOFT) 50 MG tablet Take 50 mg by mouth daily.    [provider]  thiamine 100 MG tablet Take 1 tablet (100 mg total) by mouth daily. Patient not taking: Reported on 12/13/2015 03/02/13   Jonetta Osgood, MD     Critical care time: 60 minutes   Critical care time was exclusive of separately billable procedures and treating other patients.  Critical care was necessary to treat or prevent imminent or life-threatening deterioration.  Critical care was time spent personally by me on the following activities: development of treatment plan with patient and/or surrogate as well as nursing, discussions with consultants, evaluation of patient's response to treatment, examination of patient, obtaining history from patient or surrogate, ordering and performing treatments and interventions, ordering and review of laboratory studies, ordering and review of radiographic studies, pulse oximetry and re-evaluation of patient's condition.  Lanier Clam, MD See Shea Evans for contact info

## 2021-03-02 NOTE — ED Provider Notes (Addendum)
Jason [name]   CSN: [id number] Arrival date & time: 03/02/21  0451     History  Chief Complaint  Patient presents with   Fever   Emesis    Jason [name] is a 76 y.o. male.  The history is provided by the EMS personnel. The history is limited by the condition of the patient.  Fever Max temp prior to arrival:  101 Temp source:  Oral Severity:  Moderate Onset quality:  Gradual Duration:  3 days Timing:  Constant Progression:  Unchanged Chronicity:  New Relieved by:  Nothing Worsened by:  Nothing Associated symptoms: confusion and vomiting   Associated symptoms comment:  Audible breathing sounds with O2 level of 72% Patient with a h/o bladder cancer and sepsis presents with fever, emesis and hypoxia at [facility] since Tuesday am.  Patient with audible breathing and reported SPO2 of 72% at facility with tachycardia.  EMS called sepsis en route.      Home Medications Prior to Admission medications   Medication Sig Start Date End Date Taking? Authorizing Provider  acetaminophen (TYLENOL) 325 MG tablet Take 2 tablets (650 mg total) by mouth every 6 (six) hours as needed for mild pain (or Fever >/= 101). 01/31/18   Rehman, Areeg N, DO  albuterol (PROVENTIL) (2.5 MG/3ML) 0.083% nebulizer solution Take 3 mLs (2.5 mg total) by nebulization every 4 (four) hours as needed for wheezing or shortness of breath. Patient not taking: Reported on 04/01/2014 03/11/13   Dellinger, Haynes Dage L, PA-C  collagenase (SANTYL) ointment Apply topically daily. Apply to sacral wound daily. Patient not taking: Reported on 04/01/2014 03/02/13   Jonetta Osgood, MD  docusate sodium (COLACE) 100 MG capsule Take 1 capsule (100 mg total) by mouth 2 (two) times daily as needed (take to keep stool soft.). Patient not taking: Reported on 01/27/2018 02/13/13   Ardis Hughs, MD  feeding supplement, GLUCERNA SHAKE, (GLUCERNA SHAKE) LIQD Take 237 mLs by mouth  daily. Patient not taking: Reported on 12/13/2015 03/02/13   Jonetta Osgood, MD  Multiple Vitamin (MULTIVITAMIN WITH MINERALS) TABS tablet Take 1 tablet by mouth daily. Patient not taking: Reported on 01/27/2018 03/02/13   Jonetta Osgood, MD  pantoprazole (PROTONIX) 40 MG tablet Take 1 tablet (40 mg total) by mouth 2 (two) times daily for 30 days. Take 40 mg twice a day for one month and then continue to take 40 mg once a day. 01/31/18 03/02/18  Rehman, Areeg N, DO  promethazine (PHENERGAN) 25 MG tablet Take 25 mg by mouth every 6 (six) hours as needed for nausea or vomiting.    [provider]  sertraline (ZOLOFT) 50 MG tablet Take 50 mg by mouth daily.    [provider]  thiamine 100 MG tablet Take 1 tablet (100 mg total) by mouth daily. Patient not taking: Reported on 12/13/2015 03/02/13   Jonetta Osgood, MD      Allergies    Patient has no known allergies.    Review of Systems   Review of Systems  Unable to perform ROS: Acuity of condition  Constitutional:  Positive for fever.  Respiratory:  Positive for shortness of breath.   Gastrointestinal:  Positive for abdominal distention and vomiting.  Psychiatric/Behavioral:  Positive for confusion.    Physical Exam Updated Vital Signs BP 127/82 (BP Location: Right Arm)    Pulse (!) 165    Temp (!) 101.1 F (38.4 C) (Rectal)    Resp 17  Ht 5\' 8"  (1.727 m)    Wt 75 kg    SpO2 92%    BMI 25.14 kg/m  Physical Exam Vitals and nursing [name] reviewed. Exam conducted with a chaperone present.  Constitutional:      General: He is in acute distress.  HENT:     Head: Normocephalic and atraumatic.     Nose: Nose normal.     Mouth/Throat:     Comments: Feculent emesis in mouth  Eyes:     Conjunctiva/sclera: Conjunctivae normal.     Pupils: Pupils are equal, round, and reactive to light.  Cardiovascular:     Rate and Rhythm: Regular rhythm. Tachycardia present.  Pulmonary:     Effort: Tachypnea present.     Breath  sounds: Rhonchi and rales present.     Comments: Audible rhonchi and rales and gurgling  Abdominal:     General: There is distension.     Palpations: There is mass.  Genitourinary:    Comments: Mass above symphysis pubis left of center  Musculoskeletal:     Cervical back: Normal range of motion and neck supple.     Right lower leg: No edema.     Left lower leg: No edema.  Skin:    General: Skin is warm and dry.  Neurological:     Deep Tendon Reflexes: Reflexes normal.    ED Results / Procedures / Treatments   Labs (all labs ordered are listed, but only abnormal results are displayed) Results for orders placed or performed during the hospital encounter of 03/02/21  CBC WITH DIFFERENTIAL  Result Value Ref Range   WBC 29.2 (H) 4.0 - 10.5 K/uL   RBC 7.19 (H) 4.22 - 5.81 MIL/uL   Hemoglobin 16.3 13.0 - 17.0 g/dL   HCT 51.5 39.0 - 52.0 %   MCV 71.6 (L) 80.0 - 100.0 fL   MCH 22.7 (L) 26.0 - 34.0 pg   MCHC 31.7 30.0 - 36.0 g/dL   RDW 17.7 (H) 11.5 - 15.5 %   Platelets 444 (H) 150 - 400 K/uL   nRBC 0.0 0.0 - 0.2 %   Neutrophils Relative % PENDING %   Neutro Abs PENDING 1.7 - 7.7 K/uL   Band Neutrophils PENDING %   Lymphocytes Relative PENDING %   Lymphs Abs PENDING 0.7 - 4.0 K/uL   Monocytes Relative PENDING %   Monocytes Absolute PENDING 0.1 - 1.0 K/uL   Eosinophils Relative PENDING %   Eosinophils Absolute PENDING 0.0 - 0.5 K/uL   Basophils Relative PENDING %   Basophils Absolute PENDING 0.0 - 0.1 K/uL   WBC Morphology PENDING    RBC Morphology PENDING    Smear Review PENDING    Other PENDING %   nRBC PENDING 0 /100 WBC   Metamyelocytes Relative PENDING %   Myelocytes PENDING %   Promyelocytes Relative PENDING %   Blasts PENDING %   Immature Granulocytes PENDING %   Abs Immature Granulocytes PENDING 0.00 - 0.07 K/uL  I-stat chem 8, ED (not at [facility] or [facility])  Result Value Ref Range   Sodium 137 135 - 145 mmol/L   Potassium 5.1 3.5 - 5.1 mmol/L   Chloride 107 98 - 111  mmol/L   BUN 54 (H) 8 - 23 mg/dL   Creatinine, Ser 3.30 (H) 0.61 - 1.24 mg/dL   Glucose, Bld 276 (H) 70 - 99 mg/dL   Calcium, Ion 1.00 (L) 1.15 - 1.40 mmol/L   TCO2 18 (L) 22 - 32 mmol/L  Hemoglobin 19.4 (H) 13.0 - 17.0 g/dL   HCT 57.0 (H) 39.0 - 52.0 %   DG Chest Portable 1 View  Result Date: 03/02/2021 CLINICAL DATA:  76 year old male with cough, fever, vomiting. EXAM: PORTABLE CHEST 1 VIEW COMPARISON:  07/05/2017 chest radiographs and earlier. FINDINGS: Portable AP semi upright view at 0506 hours. Chronic lung disease with striking asymmetric reticulonodular opacity demonstrated on 2018 chest CT. Lung markings do not appear significantly changed from that time when allowing for portable technique. Mediastinal contours remain normal. No pneumothorax, pleural effusion or consolidation. Background pulmonary vascularity appears normal. No acute osseous abnormality identified. Paucity of bowel gas in the upper abdomen. IMPRESSION: Chronic lung disease as demonstrated on a 2018 CT. No acute cardiopulmonary abnormality identified. Electronically Signed   By: Genevie Ann M.D.   On: 03/02/2021 05:34     EKG None  Radiology DG Chest Portable 1 View  Result Date: 03/02/2021 CLINICAL DATA:  76 year old male with cough, fever, vomiting. EXAM: PORTABLE CHEST 1 VIEW COMPARISON:  07/05/2017 chest radiographs and earlier. FINDINGS: Portable AP semi upright view at 0506 hours. Chronic lung disease with striking asymmetric reticulonodular opacity demonstrated on 2018 chest CT. Lung markings do not appear significantly changed from that time when allowing for portable technique. Mediastinal contours remain normal. No pneumothorax, pleural effusion or consolidation. Background pulmonary vascularity appears normal. No acute osseous abnormality identified. Paucity of bowel gas in the upper abdomen. IMPRESSION: Chronic lung disease as demonstrated on a 2018 CT. No acute cardiopulmonary abnormality identified.  Electronically Signed   By: Genevie Ann M.D.   On: 03/02/2021 05:34    Procedures Hernia reduction  Date/Time: 03/02/2021 6:19 AM Performed by: Veatrice Kells, MD Authorized by: Veatrice Kells, MD  Consent: The procedure was performed in an emergent situation. Verbal consent not obtained. Written consent not obtained. Patient identity confirmed: arm band Time out: Immediately prior to procedure a "time out" was called to verify the correct patient, procedure, equipment, support staff and site/side marked as required. Preparation: Patient was prepped and draped in the usual sterile fashion. Local anesthesia used: no  Anesthesia: Local anesthesia used: no  Sedation: Patient sedated: patient is already intubated on sedation.  Patient tolerance: patient tolerated the procedure well with no immediate complications Comments: Successful reduction at the bedside       Medications Ordered in ED Medications  lactated ringers infusion (has no administration in time range)  lactated ringers bolus 1,000 mL (1,000 mLs Intravenous New Bag/Given 03/02/21 0543)    And  lactated ringers bolus 1,000 mL (1,000 mLs Intravenous New Bag/Given 03/02/21 0555)    And  lactated ringers bolus 250 mL (has no administration in time range)  ceFEPIme (MAXIPIME) 2 g in sodium chloride 0.9 % 100 mL IVPB (has no administration in time range)  metroNIDAZOLE (FLAGYL) IVPB 500 mg (has no administration in time range)  vancomycin (VANCOREADY) IVPB 1500 mg/300 mL (has no administration in time range)  pantoprazole (PROTONIX) 80 mg /NS 100 mL IVPB (has no administration in time range)  pantoprozole (PROTONIX) 80 mg /NS 100 mL infusion (has no administration in time range)  pantoprazole (PROTONIX) injection 40 mg (has no administration in time range)  fentaNYL 2569mcg in NS 239mL (34mcg/ml) infusion-PREMIX (has no administration in time range)  fentaNYL 2541mcg in NS 254mL (16mcg/ml) infusion-PREMIX (has no administration in  time range)  etomidate (AMIDATE) injection 20 mg (20 mg Intravenous Given 03/02/21 0516)  rocuronium bromide 10 mg/mL (PF) syringe (80 mg Intravenous Given 03/02/21 0517)  acetaminophen (TYLENOL) suppository 650 mg (650 mg Rectal Given 03/02/21 0547)     EKG Interpretation  Date/Time:  Thursday March 02 2021 04:58:24 EST Ventricular Rate:  136 PR Interval:  129 QRS Duration: 116 QT Interval:  304 QTC Calculation: 458 R Axis:   52 Text Interpretation: Sinus tachycardia Right bundle branch block Confirmed by Randal Buba, Avigayil Ton ([id number]) on 03/02/2021 5:57:38 AM        ED Course/ Medical Decision Making/ A&P                           Medical Decision Making Patient with 3 days of fever and emesis and tachycardia and decreased LOC at Nursing home with SPO2 of 72%.  Only in the 80s on 5l [place]  Problems Addressed: AKI (acute kidney injury) [facility]): acute illness or injury    Details: hydrated with 30 cc/kg bolus Aspiration pneumonia due to gastric secretions, unspecified laterality, unspecified part of lung ([place]): acute illness or injury with systemic symptoms that poses a threat to life or bodily functions    Details: cultures drawn broad spectrum antibiotics initiated Dehydration:    Details: 30 cc/ KG bolus LR Sepsis, due to unspecified organism, unspecified whether acute organ dysfunction present [facility] ([place])): acute illness or injury with systemic symptoms that poses a threat to life or bodily functions    Details: cultures drawn antibiotics Vomiting, unspecified vomiting type, unspecified whether nausea present: acute illness or injury    Details: OG tube placed to decrompress stomach 1 L out  Amount and/or Complexity of Data Reviewed Independent Historian: EMS    Details: see above Labs: ordered.    Details: all labs reviewed by me: white count 29.2 with hemoconcentration of hemoglobin at 16.3 and thrombocytosis of 444,000. Sodium 137, potassium 5.1 BUN elevated at 54 creatineing is 3.3 with  elevated glucose of 276 Radiology: ordered.    Details: CXR reviewed by me:  OG tube in good position and ETT above the carina. CT re viewed in scanner by me with SBO and hernia with bowel in it ECG/medicine tests: ordered and independent interpretation performed. Decision-making details documented in ED Course. Discussion of management or test interpretation with external provider(s): Case d/w Dr. Nancie Neas who will see the patient for admission   Risk OTC drugs. Prescription drug management. Parenteral controlled substances. Drug therapy requiring intensive monitoring for toxicity. Decision regarding hospitalization. Minor surgery with identified risk factors. Risk Details: Multiple boluses give per sepsis bundle along with multiple antibiotics per sepsis bundle.  Patient on fentanyl drip for sedation post intubation as this is blood pressure neutral.    Critical Care Total time providing critical care: 75-105 minutes   CRITICAL CARE Performed by: Jacqulynn Shappell K Cherrise Occhipinti-Rasch Total critical care time: 75 minutes Critical care time was exclusive of separately billable procedures and treating other patients. Critical care was necessary to treat or prevent imminent or life-threatening deterioration. Critical care was time spent personally by me on the following activities: development of treatment plan with patient and/or surrogate as well as nursing, discussions with consultants, evaluation of patient's response to treatment, examination of patient, obtaining history from patient or surrogate, ordering and performing treatments and interventions, ordering and review of laboratory studies, ordering and review of radiographic studies, pulse oximetry and re-evaluation of patient's condition.   Hernia was reduced in the ED due to distention and feculent emesis.  This procedure was performed prior to CT results.   Final Clinical Impression(s) / ED Diagnoses Final  diagnoses:  Sepsis, due to  unspecified organism, unspecified whether acute organ dysfunction present ([place])  AKI (acute kidney injury) ([place])  Aspiration pneumonia due to gastric secretions, unspecified laterality, unspecified part of lung ([facility])  Vomiting, unspecified vomiting type, unspecified whether nausea present  Hyperglycemia  Dehydration    The patient appears reasonably stabilized for admission considering the current resources, flow, and capabilities available in the ED at this time, and I doubt any other [facility] requiring further screening and/or treatment in the ED prior to admission.    Bandy Honaker, MD 03/02/21 7096    Veatrice Kells, MD 03/03/21 2836

## 2021-03-02 NOTE — ED Notes (Signed)
[phone] NICK pt daughter called to check on pt. ?

## 2021-03-02 NOTE — ED Provider Notes (Signed)
?  [facility] ?Provider Note ? ? ? ? ? ?Procedures ?Procedure Name: Intubation ?Date/Time: 03/02/2021 5:34 AM ?Performed by: Montine Circle, PA-C ?Pre-anesthesia Checklist: Patient identified, Patient being monitored, Emergency Drugs available, Timeout performed and Suction available ?Oxygen Delivery Method: Non-rebreather mask ?Preoxygenation: 72. ?Induction Type: Rapid sequence ?Ventilation: Mask ventilation without difficulty and Two handed mask ventilation required ?Laryngoscope Size: Glidescope and 4 ?Tube size: 7.5 mm ?Number of attempts: 2 ?Airway Equipment and Method: Video-laryngoscopy ?Placement Confirmation: ETT inserted through vocal cords under direct vision, CO2 detector and Breath sounds checked- equal and bilateral ?Secured at: 26 cm ?Tube secured with: ETT holder ?Dental Injury: Teeth and Oropharynx as per pre-operative assessment  ? ? ?  ? ?  ?Montine Circle, PA-C ?03/02/21 8099 ? ?  ?Palumbo, April, MD ?03/02/21 0602 ? ?

## 2021-03-02 NOTE — Progress Notes (Signed)
PCCM progress note: ? ?Briefly, this is a 76 year old with bladder cancer (unclear of active or actively treated), reported history of dementia, reported history of COPD who presented from a skilled nursing facility with onset of nausea and vomiting for 36 hours found this morning covered in vomit and hypoxemic concern for aspiration pneumonia.  Subsequently intubated.  History of COPD.  Difficult to ventilate.  PCO2 remains elevated.  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  Reportedly this was reduced by the ED physician.  Surgery seen the patient and deemed him not a candidate for surgical intervention due to his multiorgan failure with developing renal failure, hyperkalemia, mixed respiratory and metabolic acidosis. ? ?On my exam patient is extremely tachycardic.  Sedated but appears uncomfortable, likely in pain.  Blood gas reveals persistent mixed acidosis with pH 7.1.  He is off vasopressors.  Unable to place a Foley due to obstruction, unclear if hernia versus bladder pathology given history of bladder cancer. ? ?My interventions as follows: ?--Increase respiratory on vent to help reduce PCO2 and help with acidosis ?--Start sodium bicarbonate gtt. ?--Urology consult for bladder decompression, Foley placement.  Unclear if retaining urine as no bladder scanner in the ED but if so this could be contribute to his renal injury from a postobstructive state ?--Increase fentanyl bolus 200 mcg, continue to increase fentanyl gtt. as needed ?--Additional 1 L LR bolus given tachycardia and likely severe hypovolemia due to preceding nausea and vomiting, poor p.o. intake, likely prerenal kidney injury ?--Very guarded prognosis as if lactic acidosis related to ongoing infarction of bowel due to strangulation and he is not a surgical candidate, there is nothing we can do to improve this ? ?CRITICAL  CARE ?Performed by: Bonna Gains Teighlor Korson ? ? ?Total critical care time: 32 minutes ? ?Critical care time was exclusive of separately billable procedures and treating other patients. ? ?Critical care was necessary to treat or prevent imminent or life-threatening deterioration. ? ?Critical care was time spent personally by me on the following activities: development of treatment plan with patient and/or surrogate as well as nursing, discussions with consultants, evaluation of patient's response to treatment, examination of patient, obtaining history from patient or surrogate, ordering and performing treatments and interventions, ordering and review of laboratory studies, ordering and review of radiographic studies, pulse oximetry and re-evaluation of patient's condition. ? ?Lanier Clam, MD ?See Shea Evans for contact info ? ?

## 2021-03-02 NOTE — ED Triage Notes (Signed)
Pt presents to the ED from [facility] via [place] with complaints of vomiting and fever onset Tuesday. Per facility pt has been vomiting and was found laying over and covered in vomit this morning. Spo2 72% on RA at facility. Placed on 5L via [place] at this time. ?

## 2021-03-02 NOTE — Progress Notes (Signed)
Patient transported from ED to room 2M10 on the ventilator with no problems. ?

## 2021-03-02 NOTE — Progress Notes (Signed)
Jason [name], MRN:  [id number], DOB:  02/11/1945, LOS: 0 ADMISSION DATE:  03/02/2021, CONSULTATION DATE:  03/02/2021 REFERRING MD:  Samara Deist, MD CHIEF COMPLAINT:  SOB   History of Present Illness:  Pt is a 76 yo m w/ PMhx of dementia, bladder cancer presenting from SNF for SOB and emesis since early Tuesday AM. On arrival he was found to be somnolent w/ rhonchus breathing, sats in low 70s. Immediately intubated by ED MD and found feculent emesis throughout oropharynx and airway. Upon OGT placement had ~ 1L of immediate output. Per reports pt had associated tachypnea, fever and emesis since early Tuesday AM as well. No family present.   CT ab/pelvis on my review concerning for pneumatosis & SBO involving left inguinal & scrotum. Final read pending. ED MD was able to reduce palpable anterior abdominal wall portion however scrotum remains full. ED nursing unable to pass foley despite multiple attempts.   Pertinent  Medical History  Dementia  ? Hx of Bladder cancer   Significant Hospital Events: Including procedures, antibiotic start and stop dates in addition to other pertinent events    Interim History / Subjective:   Objective   Blood pressure (!) 92/59, pulse (!) 113, temperature 99 F (37.2 C), temperature source Oral, resp. rate 14, height 5\' 8"  (1.727 m), weight 75 kg, SpO2 93 %.    Vent Mode: PSV;CPAP FiO2 (%):  [40 %-100 %] 50 % Set Rate:  [18 bmp-26 bmp] 26 bmp Vt Set:  [550 mL-580 mL] 580 mL PEEP:  [5 cmH20-8 cmH20] 8 cmH20 Pressure Support:  [15 cmH20] 15 cmH20 Plateau Pressure:  [19 cmH20-31 cmH20] 28 cmH20   Intake/Output Summary (Last 24 hours) at 03/02/2021 1845 Last data filed at 03/02/2021 1801 Gross per 24 hour  Intake 4874.82 ml  Output 1300 ml  Net 3574.82 ml    Filed Weights   03/02/21 0504  Weight: 75 kg    Examination: General: intubated  HENT: ETT in place, OGT with dark feculent output  Lungs: course on vent  Cardiovascular: regular,  tachycardic, no MRG  Abdomen: soft, distended, absent bowel sounds, midline incision scar  Extremities: warm to touch, no LE edema  Neuro: intubated, received paralytic  GU: large, soft scrotum on left  Resolved Hospital Problem list    Assessment & Plan:  Acute hypoxic respiratory failure secondary to aspiration, with developing ARDS, moderate with PF ratio less than 150 - l attempt lung protective ventilation, currently 8 cc/kg, difficult to ventilate due to vent dyssynchrony, ventilated quite well on pressure support but given developing ARDS, tidal volumes too high so placed on PRVC on 8 cc/kg as above --Midazolam drip, fentanyl drip, RASS goal -4-5 --Vecuronium x1 this evening -PaO2 goal 55-80 -ABG ordered after increasing sedation and paralysis x1  SBO with incarcerated hernia and evidence of ischemia/pneumatosis.  Reduced in the ED.  Surgery following.  Not a surgical candidate given critical illness. - prior history of bowel incarceration secondary to right inguinal hernia, s/p mesh repair - continue OGT to LIS  - gen surg consult - strict NPO  - continue to trend LA, trending down  Septic shock  -In the setting of aspiration pneumonia and ARDS - continue Zosyn - received 2.5 L in ED, additional 2 L given, appears volume replete on exam -MAP greater than 65, maxed on norepinephrine, added vaso, added epinephrine - check echo   Renal failure due to hypovolemia and septic shock -Sodium bicarbonate infusion -Foley placed by urology,  appreciate assistance - check a UA w/ culture reflex when able   GERD - PPI   Dementia  - at baseline A&O x3 per daughter, can ambulate, performs no ADLs, does not communicate, cannot speak but minimal speaking  GOC - per discussion with daughter, FULL code    Best Practice (right click and "Reselect all SmartList Selections" daily)   Diet/type: NPO DVT prophylaxis: LMWH GI prophylaxis: PPI Lines: N/A Foley:  Yes, and it is still  needed Code Status:  full code Last date of multidisciplinary goals of care discussion [03/02/21 with daughter, explained multi organ failure and refractory shock, worsening.  Worsening chest x-ray.  Recommended DNR.  She expressed understanding.  She stated she wants [place] to perform resuscitative efforts.]  Labs   CBC: Recent Labs  Lab 03/02/21 0512 03/02/21 0520 03/02/21 0552 03/02/21 0854 03/02/21 1401 03/02/21 1558 03/02/21 1658  WBC 29.2*  --   --   --   --  7.0  --   NEUTROABS 24.5*  --   --   --   --   --   --   HGB 16.3   < > 16.3 15.6 13.6 12.3* 12.9*  HCT 51.5   < > 48.0 46.0 40.0 40.2 38.0*  MCV 71.6*  --   --   --   --  71.7*  --   PLT 444*  --   --   --   --  310  --    < > = values in this interval not displayed.     Basic Metabolic Panel: Recent Labs  Lab 03/02/21 0512 03/02/21 0520 03/02/21 0552 03/02/21 0854 03/02/21 1401 03/02/21 1558 03/02/21 1658  NA 137 137 137 138 139 138 134*  K 5.5* 5.1 4.3 5.1 4.7 4.6 4.2  CL 99 107  --   --   --  102  --   CO2 14*  --   --   --   --  19*  --   GLUCOSE 270* 276*  --   --   --  231*  --   BUN 55* 54*  --   --   --  60*  --   CREATININE 3.34* 3.30*  --   --   --  3.36*  --   CALCIUM 9.9  --   --   --   --  7.8*  --     GFR: Estimated Creatinine Clearance: 17.8 mL/min (A) (by C-G formula based on SCr of 3.36 mg/dL (H)). Recent Labs  Lab 03/02/21 0512 03/02/21 0848 03/02/21 1437 03/02/21 1558  WBC 29.2*  --   --  7.0  LATICACIDVEN 8.6* 6.5* 5.2*  --      Liver Function Tests: Recent Labs  Lab 03/02/21 0512 03/02/21 1558  AST 43* 39  ALT 25 22  ALKPHOS 109 67  BILITOT 0.6 1.4*  PROT 8.8* 5.4*  ALBUMIN 3.9 2.3*    No results for input(s): LIPASE, AMYLASE in the last 168 hours. No results for input(s): AMMONIA in the last 168 hours.  ABG    Component Value Date/Time   PHART 7.349 (L) 03/02/2021 1658   PCO2ART 35.5 03/02/2021 1658   PO2ART 58 (L) 03/02/2021 1658   HCO3 19.6 (L) 03/02/2021  1658   TCO2 21 (L) 03/02/2021 1658   ACIDBASEDEF 5.0 (H) 03/02/2021 1658   O2SAT 89 03/02/2021 1658      Coagulation Profile: Recent Labs  Lab 03/02/21 0512  INR 1.2  Cardiac Enzymes: No results for input(s): CKTOTAL, CKMB, CKMBINDEX, TROPONINI in the last 168 hours.  HbA1C: Hgb A1c MFr Bld  Date/Time Value Ref Range Status  03/02/2021 05:12 AM 7.6 (H) 4.8 - 5.6 % Final    Comment:    (NOTE) Pre diabetes:          5.7%-6.4%  Diabetes:              >6.4%  Glycemic control for   <7.0% adults with diabetes   05/23/2009 01:36 PM 6.2 (H) <5.7 % Final    Comment:    See lab report for associated comment(s)    CBG: Recent Labs  Lab 03/02/21 0838 03/02/21 1141 03/02/21 1550 03/02/21 1715  GLUCAP 226* 143* 194* 220*     Review of Systems:   Unable to obtain due to intubation status   Past Medical History:  He,  has a past medical history of Bladder mass (02-10-13), COPD (chronic obstructive pulmonary disease) ([place]), Dementia ([place]) (02/18/2013), Goiter, toxic, 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   Surgical History:   Past Surgical History:  Procedure Laterality Date   BALLOON DILATION N/A 04/07/2014   Procedure: BALLOON DILATION;  Surgeon: Ardis Hughs, MD;  Location: [facility];  Service: Urology;  Laterality: N/A;   CYSTOSCOPY W/ RETROGRADES Bilateral 04/07/2014   Procedure: BILATERAL RETROGRADE PYELOGRAM;  Surgeon: Ardis Hughs, MD;  Location: [facility];  Service: Urology;  Laterality: Bilateral;   CYSTOSCOPY/RETROGRADE/URETEROSCOPY Bilateral 02/13/2013   Procedure: BILATERAL RETROGRADE;  Surgeon: Ardis Hughs, MD;  Location: [facility];  Service: Urology;  Laterality: Bilateral;   INGUINAL HERNIA REPAIR Right 01/30/2018   Procedure: OPEN RIGHT INGUINAL HERNIA REPAIR WITH MESH;  Surgeon: Erroll Luna, MD;  Location: [facility];  Service: General;  Laterality: Right;    LAPAROTOMY N/A 02/18/2013   Procedure: EXPLORATORY LAPAROTOMY drainage of retroperitoneal abscess, drainage of Pre-Peritoneal abscess and Exploration of bladder perforation;  Surgeon: Imogene Burn. Georgette Dover, MD;  Location: [facility];  Service: General;  Laterality: N/A;   TRANSURETHRAL RESECTION OF BLADDER TUMOR WITH GYRUS (TURBT-GYRUS) N/A 02/13/2013   Procedure: TRANSURETHRAL RESECTION OF BLADDER TUMOR WITH GYRUS (TURBT-GYRUS), bladder biopsies;  Surgeon: Ardis Hughs, MD;  Location: [facility];  Service: Urology;  Laterality: N/A;     Social History:   reports that he has been smoking cigarettes. He has been smoking an average of 1 pack per day. He has never used smokeless tobacco. He reports current alcohol use. He reports that he does not use drugs.   Family History:  His family history is not on file.   Allergies No Known Allergies   Home Medications  Prior to Admission medications   Medication Sig Start Date End Date Taking? Authorizing Provider  acetaminophen (TYLENOL) 325 MG tablet Take 2 tablets (650 mg total) by mouth every 6 (six) hours as needed for mild pain (or Fever >/= 101). 01/31/18   Rehman, Areeg N, DO  albuterol (PROVENTIL) (2.5 MG/3ML) 0.083% nebulizer solution Take 3 mLs (2.5 mg total) by nebulization every 4 (four) hours as needed for wheezing or shortness of breath. Patient not taking: Reported on 04/01/2014 03/11/13   Dellinger, Haynes Dage L, PA-C  collagenase (SANTYL) ointment Apply topically daily. Apply to sacral wound daily. Patient not taking: Reported on 04/01/2014 03/02/13   Jonetta Osgood, MD  docusate sodium (COLACE) 100 MG capsule Take 1 capsule (100 mg total) by mouth 2 (two) times daily as needed (take  to keep stool soft.). Patient not taking: Reported on 01/27/2018 02/13/13   Ardis Hughs, MD  feeding supplement, GLUCERNA SHAKE, (GLUCERNA SHAKE) LIQD Take 237 mLs by mouth daily. Patient not taking: Reported on 12/13/2015 03/02/13   Jonetta Osgood, MD   Multiple Vitamin (MULTIVITAMIN WITH MINERALS) TABS tablet Take 1 tablet by mouth daily. Patient not taking: Reported on 01/27/2018 03/02/13   Jonetta Osgood, MD  pantoprazole (PROTONIX) 40 MG tablet Take 1 tablet (40 mg total) by mouth 2 (two) times daily for 30 days. Take 40 mg twice a day for one month and then continue to take 40 mg once a day. 01/31/18 03/02/18  Rehman, Areeg N, DO  promethazine (PHENERGAN) 25 MG tablet Take 25 mg by mouth every 6 (six) hours as needed for nausea or vomiting.    [provider]  sertraline (ZOLOFT) 50 MG tablet Take 50 mg by mouth daily.    [provider]  thiamine 100 MG tablet Take 1 tablet (100 mg total) by mouth daily. Patient not taking: Reported on 12/13/2015 03/02/13   Jonetta Osgood, MD     Critical care time: 60 minutes   Critical care time was exclusive of separately billable procedures and treating other patients.  Critical care was necessary to treat or prevent imminent or life-threatening deterioration.  Critical care was time spent personally by me on the following activities: development of treatment plan with patient and/or surrogate as well as nursing, discussions with consultants, evaluation of patient's response to treatment, examination of patient, obtaining history from patient or surrogate, ordering and performing treatments and interventions, ordering and review of laboratory studies, ordering and review of radiographic studies, pulse oximetry and re-evaluation of patient's condition.  Lanier Clam, MD See Shea Evans for contact info

## 2021-03-02 NOTE — Consult Note (Signed)
Consultation: Urethral stricture Requested by: Dr. Quin Hoop  History of Present Illness: Jason [name] is a 76 year old male followed by Dr. Louis Meckel.  He has a history of high-grade T1 bladder cancer and dense bulbar urethral stricture.  He is in ICU on the ventilator being resuscitated after likely small bowel obstruction.  Given his critical ill status it is important to monitor his urine output.  A Foley catheter could not be placed by nursing.  Urology was consulted.  He did undergo CT scan of the abdomen and pelvis which was benign from a GU point of view.  Patient's wife was talking with Dr. Silas Flood. I discussed with her the nature risk benefits alternatives to cystoscopy, urethral stricture dilation similar to what stent done in the office in the past.  All questions answered.  She elected to proceed.  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   Past Surgical History:  Procedure Laterality Date   BALLOON DILATION N/A 04/07/2014   Procedure: BALLOON DILATION;  Surgeon: Ardis Hughs, MD;  Location: [facility];  Service: Urology;  Laterality: N/A;   CYSTOSCOPY W/ RETROGRADES Bilateral 04/07/2014   Procedure: BILATERAL RETROGRADE PYELOGRAM;  Surgeon: Ardis Hughs, MD;  Location: [facility];  Service: Urology;  Laterality: Bilateral;   CYSTOSCOPY/RETROGRADE/URETEROSCOPY Bilateral 02/13/2013   Procedure: BILATERAL RETROGRADE;  Surgeon: Ardis Hughs, MD;  Location: [facility];  Service: Urology;  Laterality: Bilateral;   INGUINAL HERNIA REPAIR Right  01/30/2018   Procedure: OPEN RIGHT INGUINAL HERNIA REPAIR WITH MESH;  Surgeon: Erroll Luna, MD;  Location: [place];  Service: General;  Laterality: Right;   LAPAROTOMY N/A 02/18/2013   Procedure: EXPLORATORY LAPAROTOMY drainage of retroperitoneal abscess, drainage of Pre-Peritoneal abscess and Exploration of bladder perforation;  Surgeon: Imogene Burn. Georgette Dover, MD;  Location: [place];  Service: General;  Laterality: N/A;   TRANSURETHRAL RESECTION OF BLADDER TUMOR WITH GYRUS (TURBT-GYRUS) N/A 02/13/2013   Procedure: TRANSURETHRAL RESECTION OF BLADDER TUMOR WITH GYRUS (TURBT-GYRUS), bladder biopsies;  Surgeon: Ardis Hughs, MD;  Location: [facility];  Service: Urology;  Laterality: N/A;    Home Medications:  Medications Prior to Admission  Medication Sig Dispense Refill Last Dose   acetaminophen (TYLENOL) 325 MG tablet Take 2 tablets (650 mg total) by mouth every 6 (six) hours as needed for mild pain (or Fever >/= 101).   unk   famotidine (PEPCID) 20 MG tablet Take 20 mg by mouth daily.   03/01/2021   metFORMIN (GLUCOPHAGE) 500 MG tablet Take 500 mg by mouth 2 (two) times daily with a meal.   03/01/2021   NON FORMULARY Apply 1 mg topically every 6 (six) hours as needed (anxiety/agitation). Lorazepam 56m/ml gel   unk   OXYGEN Inhale 1-6 L into the lungs as needed (to keep 02 sats at or above 90%).      OXYGEN Inhale 1-2 L into the lungs as needed (to maintain 02 sats 92% or greater).   unk   sertraline (ZOLOFT) 25 MG tablet Take 25  mg by mouth daily.   03/01/2021   albuterol (PROVENTIL) (2.5 MG/3ML) 0.083% nebulizer solution Take 3 mLs (2.5 mg total) by nebulization every 4 (four) hours as needed for wheezing or shortness of breath. (Patient not taking: Reported on 04/01/2014) 75 mL 12 Not Taking   collagenase (SANTYL) ointment Apply topically daily. Apply to sacral wound daily. (Patient not taking: Reported on 04/01/2014) 15 g 0 Not Taking   docusate sodium (COLACE) 100 MG capsule Take 1 capsule (100 mg total)  by mouth 2 (two) times daily as needed (take to keep stool soft.). (Patient not taking: Reported on 01/27/2018) 60 capsule 0 Not Taking   feeding supplement, GLUCERNA SHAKE, (GLUCERNA SHAKE) LIQD Take 237 mLs by mouth daily. (Patient not taking: Reported on 12/13/2015)  0 Not Taking   Multiple Vitamin (MULTIVITAMIN WITH MINERALS) TABS tablet Take 1 tablet by mouth daily. (Patient not taking: Reported on 01/27/2018)   Not Taking   ondansetron (ZOFRAN) 4 MG tablet Take 4 mg by mouth See admin instructions. Every 6 hours x 7 days      pantoprazole (PROTONIX) 40 MG tablet Take 1 tablet (40 mg total) by mouth 2 (two) times daily for 30 days. Take 40 mg twice a day for one month and then continue to take 40 mg once a day. (Patient not taking: Reported on 03/02/2021) 60 tablet 0 Not Taking   thiamine 100 MG tablet Take 1 tablet (100 mg total) by mouth daily. (Patient not taking: Reported on 12/13/2015)   Not Taking   Allergies: No Known Allergies  History reviewed. No pertinent family history. Social History:  reports that he has been smoking cigarettes. He has been smoking an average of 1 pack per day. He has never used smokeless tobacco. He reports current alcohol use. He reports that he does not use drugs.  ROS: A complete review of systems was performed.  All systems are negative except for pertinent findings as noted. Review of Systems  Unable to perform ROS: Intubated    Physical Exam:  Vital signs in last 24 hours: Temp:  [98.5 F (36.9 C)-101.1 F (38.4 C)] 98.5 F (36.9 C) (03/02 1200) Pulse Rate:  [42-165] 119 (03/02 1515) Resp:  [8-41] 13 (03/02 1515) BP: (60-214)/(29-151) 92/59 (03/02 1515) SpO2:  [33 %-100 %] 100 % (03/02 1515) Arterial Line BP: (66-86)/(53-58) 86/53 (03/02 1515) FiO2 (%):  [40 %-100 %] 50 % (03/02 1404) Weight:  [75 kg] 75 kg (03/02 0504) General:  On vent, No acute distress HEENT: Normocephalic, atraumatic Cardiovascular: Regular rate and rhythm Lungs: Regular  rate and effort Abdomen: Soft, nontender, nondistended, no mass Extremities: No edema Neurologic: Grossly intact GU: Uncircumcised, glans and meatus appeared normal.  Procedure: He was prepped and draped in the usual sterile fashion.  I tried to pass a 44 [place] Foley but it met resistance in the bulb.  Then tried to pass a guidewire but it also met resistance.  The flexible cystoscope was passed where the bulb stricture was noted.  It was about 8 [place].  A guidewire was advanced through the stricture but I was not certain the guidewire was in the bladder.  I backed the scope out and passed the [facility] urethral dilators 12 through 31 [place] only through the stricture and not further.  After passing each dilator I passed the cystoscope along the wire to check progress.  As dilation progressed I could see the proximal bulb urethra.  I could not quite get the flexible cystoscope past the stricture  with the wire in place.  Therefore I passed the scope over the wire was able to work the scope into the proximal bulbar urethra where the wire was noted.  I was able to make the turn with the scope up through the prostate and into the bladder where the guidewire was advanced.  The scope was backed out.  On the cart the smallest council tip catheter was 18 [place].  This was attempted but simply too large.  Therefore I took the 42 [place] Foley kit with the clear Silastic catheter and it on the wire with the guidance of an 18-gauge needle.  I was unable to advance the Foley over the wire and into the bladder.  The wire was removed and the balloon inflated and seated at the bladder neck.  He drained a good bit of urine, but a lot of it in the bed before I could get the bag attached.  Urine was clear.  Laboratory Data:  Results for orders placed or performed during the hospital encounter of 03/02/21 (from the past 24 hour(s))  Resp Panel by RT-PCR (Flu A&B, Covid) Nasopharyngeal Swab     Status: None   Collection Time:  03/02/21  5:02 AM   Specimen: Nasopharyngeal Swab; Nasopharyngeal(NP) swabs in vial transport medium  Result Value Ref Range   SARS Coronavirus 2 by RT PCR NEGATIVE NEGATIVE   Influenza A by PCR NEGATIVE NEGATIVE   Influenza B by PCR NEGATIVE NEGATIVE  Lactic acid, plasma     Status: Abnormal   Collection Time: 03/02/21  5:12 AM  Result Value Ref Range   Lactic Acid, Venous 8.6 (HH) 0.5 - 1.9 mmol/L  Comprehensive metabolic panel     Status: Abnormal   Collection Time: 03/02/21  5:12 AM  Result Value Ref Range   Sodium 137 135 - 145 mmol/L   Potassium 5.5 (H) 3.5 - 5.1 mmol/L   Chloride 99 98 - 111 mmol/L   CO2 14 (L) 22 - 32 mmol/L   Glucose, Bld 270 (H) 70 - 99 mg/dL   BUN 55 (H) 8 - 23 mg/dL   Creatinine, Ser 3.34 (H) 0.61 - 1.24 mg/dL   Calcium 9.9 8.9 - 10.3 mg/dL   Total Protein 8.8 (H) 6.5 - 8.1 g/dL   Albumin 3.9 3.5 - 5.0 g/dL   AST 43 (H) 15 - 41 U/L   ALT 25 0 - 44 U/L   Alkaline Phosphatase 109 38 - 126 U/L   Total Bilirubin 0.6 0.3 - 1.2 mg/dL   GFR, Estimated 18 (L) >60 mL/min   Anion gap 24 (H) 5 - 15  CBC WITH DIFFERENTIAL     Status: Abnormal   Collection Time: 03/02/21  5:12 AM  Result Value Ref Range   WBC 29.2 (H) 4.0 - 10.5 K/uL   RBC 7.19 (H) 4.22 - 5.81 MIL/uL   Hemoglobin 16.3 13.0 - 17.0 g/dL   HCT 51.5 39.0 - 52.0 %   MCV 71.6 (L) 80.0 - 100.0 fL   MCH 22.7 (L) 26.0 - 34.0 pg   MCHC 31.7 30.0 - 36.0 g/dL   RDW 17.7 (H) 11.5 - 15.5 %   Platelets 444 (H) 150 - 400 K/uL   nRBC 0.0 0.0 - 0.2 %   Neutrophils Relative % 84 %   Neutro Abs 24.5 (H) 1.7 - 7.7 K/uL   Lymphocytes Relative 8 %   Lymphs Abs 2.3 0.7 - 4.0 K/uL   Monocytes Relative 8 %   Monocytes Absolute  2.3 (H) 0.1 - 1.0 K/uL   Eosinophils Relative 0 %   Eosinophils Absolute 0.0 0.0 - 0.5 K/uL   Basophils Relative 0 %   Basophils Absolute 0.0 0.0 - 0.1 K/uL   nRBC 0 0 /100 WBC   Abs Immature Granulocytes 0.00 0.00 - 0.07 K/uL  Protime-INR     Status: Abnormal   Collection Time:  03/02/21  5:12 AM  Result Value Ref Range   Prothrombin Time 15.3 (H) 11.4 - 15.2 seconds   INR 1.2 0.8 - 1.2  APTT     Status: None   Collection Time: 03/02/21  5:12 AM  Result Value Ref Range   aPTT 28 24 - 36 seconds  Hemoglobin A1c     Status: Abnormal   Collection Time: 03/02/21  5:12 AM  Result Value Ref Range   Hgb A1c MFr Bld 7.6 (H) 4.8 - 5.6 %   Mean Plasma Glucose 171.42 mg/dL  I-stat chem 8, ED (not at [facility] or [facility])     Status: Abnormal   Collection Time: 03/02/21  5:20 AM  Result Value Ref Range   Sodium 137 135 - 145 mmol/L   Potassium 5.1 3.5 - 5.1 mmol/L   Chloride 107 98 - 111 mmol/L   BUN 54 (H) 8 - 23 mg/dL   Creatinine, Ser 3.30 (H) 0.61 - 1.24 mg/dL   Glucose, Bld 276 (H) 70 - 99 mg/dL   Calcium, Ion 1.00 (L) 1.15 - 1.40 mmol/L   TCO2 18 (L) 22 - 32 mmol/L   Hemoglobin 19.4 (H) 13.0 - 17.0 g/dL   HCT 57.0 (H) 39.0 - 52.0 %  I-Stat arterial blood gas, ED     Status: Abnormal   Collection Time: 03/02/21  5:52 AM  Result Value Ref Range   pH, Arterial 7.121 (LL) 7.35 - 7.45   pCO2 arterial 71.4 (HH) 32 - 48 mmHg   pO2, Arterial 392 (H) 83 - 108 mmHg   Bicarbonate 22.8 20.0 - 28.0 mmol/L   TCO2 25 22 - 32 mmol/L   O2 Saturation 100 %   Acid-base deficit 8.0 (H) 0.0 - 2.0 mmol/L   Sodium 137 135 - 145 mmol/L   Potassium 4.3 3.5 - 5.1 mmol/L   Calcium, Ion 1.20 1.15 - 1.40 mmol/L   HCT 48.0 39.0 - 52.0 %   Hemoglobin 16.3 13.0 - 17.0 g/dL   Patient temperature 101.1 F    Collection site RADIAL, ALLEN'S TEST ACCEPTABLE    Drawn by RT    Sample type ARTERIAL    Comment NOTIFIED PHYSICIAN   Glucose, capillary     Status: Abnormal   Collection Time: 03/02/21  8:38 AM  Result Value Ref Range   Glucose-Capillary 226 (H) 70 - 99 mg/dL  MRSA Next Gen by PCR, Nasal     Status: None   Collection Time: 03/02/21  8:47 AM   Specimen: Nasal Mucosa; Nasal Swab  Result Value Ref Range   MRSA by PCR Next Gen NOT DETECTED NOT DETECTED  Lactic acid, plasma      Status: Abnormal   Collection Time: 03/02/21  8:48 AM  Result Value Ref Range   Lactic Acid, Venous 6.5 (HH) 0.5 - 1.9 mmol/L  I-STAT 7, (LYTES, BLD GAS, ICA, H+H)     Status: Abnormal   Collection Time: 03/02/21  8:54 AM  Result Value Ref Range   pH, Arterial 7.181 (LL) 7.35 - 7.45   pCO2 arterial 57.0 (H) 32 - 48 mmHg  pO2, Arterial 175 (H) 83 - 108 mmHg   Bicarbonate 21.3 20.0 - 28.0 mmol/L   TCO2 23 22 - 32 mmol/L   O2 Saturation 99 %   Acid-base deficit 8.0 (H) 0.0 - 2.0 mmol/L   Sodium 138 135 - 145 mmol/L   Potassium 5.1 3.5 - 5.1 mmol/L   Calcium, Ion 1.18 1.15 - 1.40 mmol/L   HCT 46.0 39.0 - 52.0 %   Hemoglobin 15.6 13.0 - 17.0 g/dL   Patient temperature 98.6 F    Collection site RADIAL, ALLEN'S TEST ACCEPTABLE    Drawn by RT    Sample type ARTERIAL    Comment MD NOTIFIED, REPEAT TEST   Glucose, capillary     Status: Abnormal   Collection Time: 03/02/21 11:41 AM  Result Value Ref Range   Glucose-Capillary 143 (H) 70 - 99 mg/dL  I-STAT 7, (LYTES, BLD GAS, ICA, H+H)     Status: Abnormal   Collection Time: 03/02/21  2:01 PM  Result Value Ref Range   pH, Arterial 7.320 (L) 7.35 - 7.45   pCO2 arterial 37.2 32 - 48 mmHg   pO2, Arterial 52 (L) 83 - 108 mmHg   Bicarbonate 19.2 (L) 20.0 - 28.0 mmol/L   TCO2 20 (L) 22 - 32 mmol/L   O2 Saturation 84 %   Acid-base deficit 6.0 (H) 0.0 - 2.0 mmol/L   Sodium 139 135 - 145 mmol/L   Potassium 4.7 3.5 - 5.1 mmol/L   Calcium, Ion 1.13 (L) 1.15 - 1.40 mmol/L   HCT 40.0 39.0 - 52.0 %   Hemoglobin 13.6 13.0 - 17.0 g/dL   Patient temperature 98.5 F    Collection site RADIAL, ALLEN'S TEST ACCEPTABLE    Drawn by RT    Sample type ARTERIAL   Lactic acid, plasma     Status: Abnormal   Collection Time: 03/02/21  2:37 PM  Result Value Ref Range   Lactic Acid, Venous 5.2 (HH) 0.5 - 1.9 mmol/L  Glucose, capillary     Status: Abnormal   Collection Time: 03/02/21  3:50 PM  Result Value Ref Range   Glucose-Capillary 194 (H) 70 - 99  mg/dL  CBC     Status: Abnormal   Collection Time: 03/02/21  3:58 PM  Result Value Ref Range   WBC 7.0 4.0 - 10.5 K/uL   RBC 5.61 4.22 - 5.81 MIL/uL   Hemoglobin 12.3 (L) 13.0 - 17.0 g/dL   HCT 40.2 39.0 - 52.0 %   MCV 71.7 (L) 80.0 - 100.0 fL   MCH 21.9 (L) 26.0 - 34.0 pg   MCHC 30.6 30.0 - 36.0 g/dL   RDW 15.8 (H) 11.5 - 15.5 %   Platelets 310 150 - 400 K/uL   nRBC 0.0 0.0 - 0.2 %  Comprehensive metabolic panel     Status: Abnormal   Collection Time: 03/02/21  3:58 PM  Result Value Ref Range   Sodium 138 135 - 145 mmol/L   Potassium 4.6 3.5 - 5.1 mmol/L   Chloride 102 98 - 111 mmol/L   CO2 19 (L) 22 - 32 mmol/L   Glucose, Bld 231 (H) 70 - 99 mg/dL   BUN 60 (H) 8 - 23 mg/dL   Creatinine, Ser 3.36 (H) 0.61 - 1.24 mg/dL   Calcium 7.8 (L) 8.9 - 10.3 mg/dL   Total Protein 5.4 (L) 6.5 - 8.1 g/dL   Albumin 2.3 (L) 3.5 - 5.0 g/dL   AST 39 15 - 41 U/L  ALT 22 0 - 44 U/L   Alkaline Phosphatase 67 38 - 126 U/L   Total Bilirubin 1.4 (H) 0.3 - 1.2 mg/dL   GFR, Estimated 18 (L) >60 mL/min   Anion gap 17 (H) 5 - 15   Recent Results (from the past 240 hour(s))  Resp Panel by RT-PCR (Flu A&B, Covid) Nasopharyngeal Swab     Status: None   Collection Time: 03/02/21  5:02 AM   Specimen: Nasopharyngeal Swab; Nasopharyngeal(NP) swabs in vial transport medium  Result Value Ref Range Status   SARS Coronavirus 2 by RT PCR NEGATIVE NEGATIVE Final    Comment: (NOTE) SARS-CoV-2 target nucleic acids are NOT DETECTED.  The SARS-CoV-2 RNA is generally detectable in upper respiratory specimens during the acute phase of infection. The lowest concentration of SARS-CoV-2 viral copies this assay can detect is 138 copies/mL. A negative result does not preclude SARS-Cov-2 infection and should not be used as the sole basis for treatment or other patient management decisions. A negative result may occur with  improper specimen collection/handling, submission of specimen other than nasopharyngeal swab,  presence of viral mutation(s) within the areas targeted by this assay, and inadequate number of viral copies(<138 copies/mL). A negative result must be combined with clinical observations, patient history, and epidemiological information. The expected result is Negative.  Fact Sheet for Patients:  EntrepreneurPulse.com.au  Fact Sheet for Healthcare Providers:  IncredibleEmployment.be  This test is no t yet approved or cleared by the [place] FDA and  has been authorized for detection and/or diagnosis of SARS-CoV-2 by FDA under an Emergency Use Authorization (EUA). This EUA will remain  in effect (meaning this test can be used) for the duration of the COVID-19 declaration under Section 564(b)(1) of the Act, 21 U.S.C.section 360bbb-3(b)(1), unless the authorization is terminated  or revoked sooner.       Influenza A by PCR NEGATIVE NEGATIVE Final   Influenza B by PCR NEGATIVE NEGATIVE Final    Comment: (NOTE) The Xpert Xpress SARS-CoV-2/FLU/RSV plus assay is intended as an aid in the diagnosis of influenza from Nasopharyngeal swab specimens and should not be used as a sole basis for treatment. Nasal washings and aspirates are unacceptable for Xpert Xpress SARS-CoV-2/FLU/RSV testing.  Fact Sheet for Patients: EntrepreneurPulse.com.au  Fact Sheet for Healthcare Providers: IncredibleEmployment.be  This test is not yet approved or cleared by the [place] FDA and has been authorized for detection and/or diagnosis of SARS-CoV-2 by FDA under an Emergency Use Authorization (EUA). This EUA will remain in effect (meaning this test can be used) for the duration of the COVID-19 declaration under Section 564(b)(1) of the Act, 21 U.S.C. section 360bbb-3(b)(1), unless the authorization is terminated or revoked.  Performed at [facility], [place] [address] [place], [place] [address]   MRSA Next Gen by  PCR, Nasal     Status: None   Collection Time: 03/02/21  8:47 AM   Specimen: Nasal Mucosa; Nasal Swab  Result Value Ref Range Status   MRSA by PCR Next Gen NOT DETECTED NOT DETECTED Final    Comment: (NOTE) The GeneXpert MRSA Assay (FDA approved for NASAL specimens only), is one component of a comprehensive MRSA colonization surveillance program. It is not intended to diagnose MRSA infection nor to guide or monitor treatment for MRSA infections. Test performance is not FDA approved in patients less than 59 years old. Performed at [facility], [place] [address] [place], [place] [address]    Creatinine: Recent Labs  03/02/21 0512 03/02/21 0520 03/02/21 1558  CREATININE 3.34* 3.30* 3.36*    Impression/Assessment/plan:  Dense bulbar stricture-status post Foley catheter placement.  Continue Foley catheter until patient is extubated and able to express the painful inability to urinate if he should go into retention.  Ambulate as able to transfer to the toilet and that would be even better.  History of bladder cancer-cystoscopy today was not diagnostic.  His CT scan was benign from a GU point of view and he did have a benign CT last summer.  I will notify Dr. Louis Meckel of admission.   Jason [name] 03/02/2021, 5:02 PM

## 2021-03-02 NOTE — Sepsis Progress Note (Addendum)
Notified bedside nurse of need to administer antibiotics.  Verified with bedside RN via secure chat that there is a delay in administration of antibiotics due to patient being off the unit. ? ?

## 2021-03-02 NOTE — Procedures (Signed)
Central Venous Catheter Insertion Procedure Note ? Jason [name]  ?[id number]  ?07-Mar-1945 ? ?Date:03/02/21  ?Time:2:34 PM  ? ?Provider Performing:Sussie Minor R Kristilyn Coltrane  ? ?Procedure: Insertion of Non-tunneled Central Venous Catheter(36556) with US guidance ([id number])  ? ?Indication(s) ?Medication administration ? ?Consent ?Risks of the procedure as well as the alternatives and risks of each were explained to the patient and/or caregiver.  Consent for the procedure was obtained and is signed in the bedside chart ? ?Anesthesia ?Topical only with 1% lidocaine  ? ?Timeout ?Verified patient identification, verified procedure, site/side was marked, verified correct patient position, special equipment/implants available, medications/allergies/relevant history reviewed, required imaging and test results available. ? ?Sterile Technique ?Maximal sterile technique including full sterile barrier drape, hand hygiene, sterile gown, sterile gloves, mask, hair covering, sterile ultrasound probe cover (if used). ? ?Procedure Description ?Area of catheter insertion was cleaned with chlorhexidine and draped in sterile fashion.  With real-time ultrasound guidance a central venous catheter was placed into the left internal jugular vein. Nonpulsatile blood flow and easy flushing noted in all ports.  The catheter was sutured in place and sterile dressing applied. ? ?Complications/Tolerance ?None; patient tolerated the procedure well. ?Chest X-ray is ordered to verify placement for internal jugular or subclavian cannulation.   Chest x-ray is not ordered for femoral cannulation. ? ?EBL ?Minimal ? ?Specimen(s) ?None  ?

## 2021-03-02 NOTE — ED Notes (Signed)
Unsuccessful x2 with placing temp foley, pt has blockage approx. 5 inches inside penis and catheter will not advance. MD aware. ?

## 2021-03-02 NOTE — Progress Notes (Signed)
Pharmacy Antibiotic Note ? ?Jason [name] is a 76 y.o. male admitted on 03/02/2021 with Intraabdominal infection from SBO with septic shock + aspiration. Marland Kitchen  Pharmacy has been consulted for Zosyn dosing. ? ?CC/HPI: SOB and emesis since early Tuesday AM. Rhonchus breathing, sats in low 70s. Immediately intubated by ED MD and found feculent emesis throughout oropharynx and airway. NG with 1L output. ?- CT ab/pelvis on my review concerning for pneumatosis & SBO involving left inguinal & scrotum ? ?PMH: dementia, bladder cancer, prior history of bowel incarceration secondary to right inguinal hernia, s/p mesh repair, GERD,  ? ?ID: Intraabdominal infection from SBO with septic shock + aspiration. Tmax 101.1. WBC 29.2, Scr elevated 3.3 ?- Admit LA 8.6, ? ?Zosyn 3/2>> ? ?Plan: ?Zosyn 3.375g IV x 1 in ED then 2.25g IV q8h ?Increase Zosyn as Scr improves ? ? ? ? ? ?Height: 5\' 8"  (172.7 cm) ?Weight: 75 kg (165 lb 5.5 oz) ?IBW/kg (Calculated) : 68.4 ? ?Temp (24hrs), Avg:100.4 ?F (38 ?C), Min:99.6 ?F (37.6 ?C), Max:101.1 ?F (38.4 ?C) ? ?Recent Labs  ?Lab 03/02/21 ?3557 03/02/21 ?3220  ?WBC 29.2*  --   ?CREATININE 3.34* 3.30*  ?LATICACIDVEN 8.6*  --   ?  ?Estimated Creatinine Clearance: 18.1 mL/min (A) (by C-G formula based on SCr of 3.3 mg/dL (H)).   ? ?No Known Allergies ? ?Camdyn Laden S. Alford Highland, PharmD, BCPS ?Clinical Staff Pharmacist ?[email or web address] ? ? ?Alford Highland, [employer] ?03/02/2021 7:17 AM ? ?

## 2021-03-03 ENCOUNTER — Inpatient Hospital Stay (HOSPITAL_COMMUNITY): Payer: Medicare (Managed Care)

## 2021-03-03 DIAGNOSIS — R6521 Severe sepsis with septic shock: Secondary | ICD-10-CM | POA: Diagnosis not present

## 2021-03-03 DIAGNOSIS — E440 Moderate protein-calorie malnutrition: Secondary | ICD-10-CM | POA: Insufficient documentation

## 2021-03-03 DIAGNOSIS — E861 Hypovolemia: Secondary | ICD-10-CM | POA: Diagnosis not present

## 2021-03-03 DIAGNOSIS — A419 Sepsis, unspecified organism: Secondary | ICD-10-CM | POA: Diagnosis not present

## 2021-03-03 DIAGNOSIS — K56609 Unspecified intestinal obstruction, unspecified as to partial versus complete obstruction: Secondary | ICD-10-CM | POA: Diagnosis not present

## 2021-03-03 LAB — POCT I-STAT 7, (LYTES, BLD GAS, ICA,H+H)
Acid-Base Excess: 1 mmol/L (ref 0.0–2.0)
Acid-base deficit: 3 mmol/L — ABNORMAL HIGH (ref 0.0–2.0)
Bicarbonate: 21.5 mmol/L (ref 20.0–28.0)
Bicarbonate: 25.7 mmol/L (ref 20.0–28.0)
Calcium, Ion: 0.96 mmol/L — ABNORMAL LOW (ref 1.15–1.40)
Calcium, Ion: 0.92 mmol/L — ABNORMAL LOW (ref 1.15–1.40)
HCT: 38 % — ABNORMAL LOW (ref 39.0–52.0)
HCT: 39 % (ref 39.0–52.0)
Hemoglobin: 12.9 g/dL — ABNORMAL LOW (ref 13.0–17.0)
Hemoglobin: 13.3 g/dL (ref 13.0–17.0)
O2 Saturation: 97 %
O2 Saturation: 94 %
Patient temperature: 100.2
Patient temperature: 98.7
Potassium: 4.8 mmol/L (ref 3.5–5.1)
Potassium: 4.5 mmol/L (ref 3.5–5.1)
Sodium: 136 mmol/L (ref 135–145)
Sodium: 137 mmol/L (ref 135–145)
TCO2: 23 mmol/L (ref 22–32)
TCO2: 27 mmol/L (ref 22–32)
pCO2 arterial: 37 mmHg (ref 32–48)
pCO2 arterial: 41.3 mmHg (ref 32–48)
pH, Arterial: 7.377 (ref 7.35–7.45)
pH, Arterial: 7.402 (ref 7.35–7.45)
pO2, Arterial: 92 mmHg (ref 83–108)
pO2, Arterial: 72 mmHg — ABNORMAL LOW (ref 83–108)

## 2021-03-03 LAB — ECHOCARDIOGRAM COMPLETE
AR max vel: 1.68 cm2
AV Area VTI: 1.65 cm2
AV Area mean vel: 1.63 cm2
AV Mean grad: 3 mmHg
AV Peak grad: 5.9 mmHg
Ao pk vel: 1.21 m/s
Area-P 1/2: 3.85 cm2
Calc EF: 55.4 %
Height: 68 in
MV VTI: 1.19 cm2
S' Lateral: 3.5 cm
Single Plane A2C EF: 61 %
Single Plane A4C EF: 48 %
Weight: 2723.12 oz

## 2021-03-03 LAB — BASIC METABOLIC PANEL
Anion gap: 14 (ref 5–15)
BUN: 62 mg/dL — ABNORMAL HIGH (ref 8–23)
CO2: 21 mmol/L — ABNORMAL LOW (ref 22–32)
Calcium: 7 mg/dL — ABNORMAL LOW (ref 8.9–10.3)
Chloride: 99 mmol/L (ref 98–111)
Creatinine, Ser: 3.06 mg/dL — ABNORMAL HIGH (ref 0.61–1.24)
GFR, Estimated: 20 mL/min — ABNORMAL LOW (ref >60)
Glucose, Bld: 217 mg/dL — ABNORMAL HIGH (ref 70–99)
Potassium: 4.8 mmol/L (ref 3.5–5.1)
Sodium: 134 mmol/L — ABNORMAL LOW (ref 135–145)

## 2021-03-03 LAB — GLUCOSE, CAPILLARY
Glucose-Capillary: 127 mg/dL — ABNORMAL HIGH (ref 70–99)
Glucose-Capillary: 131 mg/dL — ABNORMAL HIGH (ref 70–99)
Glucose-Capillary: 134 mg/dL — ABNORMAL HIGH (ref 70–99)
Glucose-Capillary: 153 mg/dL — ABNORMAL HIGH (ref 70–99)
Glucose-Capillary: 160 mg/dL — ABNORMAL HIGH (ref 70–99)
Glucose-Capillary: 218 mg/dL — ABNORMAL HIGH (ref 70–99)

## 2021-03-03 LAB — PHOSPHORUS: Phosphorus: 4.1 mg/dL (ref 2.5–4.6)

## 2021-03-03 LAB — CBC
HCT: 38.7 % — ABNORMAL LOW (ref 39.0–52.0)
Hemoglobin: 12 g/dL — ABNORMAL LOW (ref 13.0–17.0)
MCH: 22 pg — ABNORMAL LOW (ref 26.0–34.0)
MCHC: 31 g/dL (ref 30.0–36.0)
MCV: 70.9 fL — ABNORMAL LOW (ref 80.0–100.0)
Platelets: 272 10*3/uL (ref 150–400)
RBC: 5.46 MIL/uL (ref 4.22–5.81)
RDW: 15.7 % — ABNORMAL HIGH (ref 11.5–15.5)
WBC: 13.1 10*3/uL — ABNORMAL HIGH (ref 4.0–10.5)
nRBC: 0 % (ref 0.0–0.2)

## 2021-03-03 LAB — MAGNESIUM: Magnesium: 1.2 mg/dL — ABNORMAL LOW (ref 1.7–2.4)

## 2021-03-03 MED ORDER — MAGNESIUM SULFATE 4 GM/100ML IV SOLN
4.0000 g | Freq: Once | INTRAVENOUS | Status: AC
  Administered 2021-03-03: 4 g via INTRAVENOUS
  Filled 2021-03-03: qty 100

## 2021-03-03 MED ORDER — AMIODARONE HCL IN DEXTROSE 360-4.14 MG/200ML-% IV SOLN
INTRAVENOUS | Status: AC
Start: 2021-03-03 — End: 2021-03-03
  Administered 2021-03-03: 150 mg/h
  Filled 2021-03-03: qty 200

## 2021-03-03 MED ORDER — FENTANYL BOLUS VIA INFUSION
50.0000 ug | INTRAVENOUS | Status: DC | PRN
  Administered 2021-03-04 – 2021-03-06 (×3): 50 ug via INTRAVENOUS
  Filled 2021-03-03: qty 50

## 2021-03-03 MED ORDER — ADENOSINE 6 MG/2ML IV SOLN
INTRAVENOUS | Status: AC
  Administered 2021-03-03: 6 mg
  Filled 2021-03-03: qty 2

## 2021-03-03 MED ORDER — AMIODARONE HCL IN DEXTROSE 360-4.14 MG/200ML-% IV SOLN
30.0000 mg/h | INTRAVENOUS | Status: DC
Start: 2021-03-03 — End: 2021-03-09
  Administered 2021-03-03 – 2021-03-08 (×11): 30 mg/h via INTRAVENOUS
  Filled 2021-03-03 (×12): qty 200

## 2021-03-03 MED ORDER — LACTATED RINGERS IV SOLN: INTRAVENOUS | Status: AC

## 2021-03-03 MED ORDER — SODIUM CHLORIDE 0.9% FLUSH
10.0000 mL | INTRAVENOUS | Status: DC | PRN
  Administered 2021-03-03: 20 mL

## 2021-03-03 MED ORDER — AMIODARONE LOAD VIA INFUSION
150.0000 mg | Freq: Once | INTRAVENOUS | Status: AC
  Administered 2021-03-03: 150 mg via INTRAVENOUS
  Filled 2021-03-03: qty 83.34

## 2021-03-03 MED ORDER — MAGNESIUM SULFATE 2 GM/50ML IV SOLN
INTRAVENOUS | Status: AC
  Filled 2021-03-03: qty 50

## 2021-03-03 MED ORDER — AMIODARONE HCL IN DEXTROSE 360-4.14 MG/200ML-% IV SOLN
60.0000 mg/h | INTRAVENOUS | Status: AC
  Administered 2021-03-03: 60 mg/h via INTRAVENOUS

## 2021-03-03 MED ORDER — ACETAMINOPHEN 650 MG RE SUPP
650.0000 mg | RECTAL | Status: DC | PRN
  Administered 2021-03-03 – 2021-03-07 (×4): 650 mg via RECTAL
  Filled 2021-03-03 (×4): qty 1

## 2021-03-03 MED ORDER — AMIODARONE IV BOLUS ONLY 150 MG/100ML
INTRAVENOUS | Status: AC
  Filled 2021-03-03: qty 100

## 2021-03-03 MED ORDER — SODIUM CHLORIDE 0.9% FLUSH
10.0000 mL | Freq: Two times a day (BID) | INTRAVENOUS | Status: DC
  Administered 2021-03-03 – 2021-03-06 (×8): 10 mL
  Administered 2021-03-07: 30 mL
  Administered 2021-03-07: 10 mL
  Administered 2021-03-08: 30 mL
  Administered 2021-03-08 – 2021-03-09 (×3): 10 mL

## 2021-03-03 MED ORDER — MAGNESIUM SULFATE 2 GM/50ML IV SOLN
2.0000 g | Freq: Once | INTRAVENOUS | Status: AC
  Administered 2021-03-03: 2 g via INTRAVENOUS
  Filled 2021-03-03: qty 50

## 2021-03-03 NOTE — Progress Notes (Signed)
Initial Nutrition Assessment ? ?DOCUMENTATION CODES:  ? ?Non-severe (moderate) malnutrition in context of chronic illness ? ?INTERVENTION:  ? ?When able to begin TF via OG tube, recommend: ?Vital 1.5 at 25 ml/h, increase by 10 ml every 4 hours to goal rate of 55 ml/h. ?Prosource TF 45 ml TID. ? ?Provides 2100 kcal, 122 gm protein, 1008 ml free water daily. ? ?If unable to begin TF/extubate and begin PO diet within the next 24-48 hours, recommend begin TPN, considering moderate malnutrition.  ? ?NUTRITION DIAGNOSIS:  ? ?Moderate Malnutrition related to chronic illness (COPD) as evidenced by moderate fat depletion, moderate muscle depletion. ? ?GOAL:  ? ?Patient will meet greater than or equal to 90% of their needs ? ?MONITOR:  ? ?Vent status, Labs, Skin, I & O's ? ?REASON FOR ASSESSMENT:  ? ?Ventilator ?  ? ?ASSESSMENT:  ? ?76 yo male admitted with SBO with incarcerated hernia. PMH includes dementia, bladder cancer, COPD, HTN, stroke. ? ?Discussed patient in ICU rounds and with RN today. ?OG tube in place to suction. ?350 ml output x 24 hours. ?Not ready to begin enteral nutrition at this time.  ? ?Patient is currently intubated on ventilator support ?MV: 17.2 L/min ?Temp (24hrs), Avg:100 ?F (37.8 ?C), Min:98 ?F (36.7 ?C), Max:102.1 ?F (38.9 ?C) ? ? ?Labs reviewed. Na 134, mag 1.2 ?CBG: 153-131 ? ?Medications reviewed and include Novolog, Protonix, Levophed, vasopressin. ?IVF:  D5 with sodium bicarb at 75 ml/h, changing to LR at 100 ml/h.  ? ?Weight history reviewed. No significant weight changes noted.  ? ?NUTRITION - FOCUSED PHYSICAL EXAM: ? ?Flowsheet Row Most Recent Value  ?Orbital Region Moderate depletion  ?Upper Arm Region Mild depletion  ?Thoracic and Lumbar Region Moderate depletion  ?Buccal Region Unable to assess  ?Temple Region Mild depletion  ?Clavicle Bone Region Moderate depletion  ?Clavicle and Acromion Bone Region Moderate depletion  ?Scapular Bone Region Mild depletion  ?Dorsal Hand No depletion   ?Patellar Region Moderate depletion  ?Anterior Thigh Region Moderate depletion  ?Posterior Calf Region Moderate depletion  ?Edema (RD Assessment) None  ?Hair Reviewed  ?Eyes Unable to assess  ?Mouth Unable to assess  ?Skin Reviewed  ?Nails Reviewed  ? ?  ? ? ?Diet Order:   ?Diet Order   ? ?       ?  Diet NPO time specified  Diet effective now       ?  ? ?  ?  ? ?  ? ? ?EDUCATION NEEDS:  ? ?No education needs have been identified at this time ? ?Skin:  Skin Assessment: Reviewed RN Assessment ? ?Last BM:  no BM documented ? ?Height:  ? ?Ht Readings from Last 1 Encounters:  ?03/02/21 5\' 8"  (1.727 m)  ? ? ?Weight:  ? ?Wt Readings from Last 1 Encounters:  ?03/03/21 77.2 kg  ? ? ?BMI:  Body mass index is 25.88 kg/m?. ? ?Estimated Nutritional Needs:  ? ?Kcal:  2751-7001 ? ?Protein:  115-135 gm ? ?Fluid:  >/= 2 L ? ? ? ?Lucas Mallow RD, LDN, CNSC ?Please refer to Amion for contact information.                                                       ? ?

## 2021-03-03 NOTE — Progress Notes (Signed)
Pt HR to 170 to 200 at 1715 w/ slight depression of BP w/ rhythm change. Dr. Tamala Julian updated. Adenosine 6 mg given rapid push. HR decreased to VR ~100 momentarily and return to RVR. Amiod bolus and gtt started. ?

## 2021-03-03 NOTE — TOC Progression Note (Signed)
Transition of Care (TOC) - Progression Note  ? ? ?Patient Details  ?Name: Jason [name] ?MRN: [id number] ?Date of Birth: Jan 13, 1945 ? ?Transition of Care (TOC) CM/SW Contact  ?Angelita Ingles, RN ?Phone [phone] ? ?03/03/2021, 4:01 PM ? ?Clinical Narrative:   ? ?Transition of Care (TOC) Screening Note ? ? ?Patient Details  ?Name: Jason [name] ?Date of Birth: 1945/07/05 ? ? ?Transition of Care (TOC) CM/SW Contact:    ?Angelita Ingles, RN ?Phone Number: ?03/03/2021, 4:02 PM ? ? ? ?Transition of Care Department [facility]) has reviewed patient and no TOC needs have been identified at this time. We will continue to monitor patient advancement through interdisciplinary progression rounds. If new patient transition needs arise, please place a TOC consult. ? ? ? ? ? ?  ?  ? ?Expected Discharge Plan and Services ?  ?  ?  ?  ?  ?                ?  ?  ?  ?  ?  ?  ?  ?  ?  ?  ? ? ?Social Determinants of Health (SDOH) Interventions ?  ? ?Readmission Risk Interventions ?No flowsheet data found. ? ?

## 2021-03-03 NOTE — Progress Notes (Addendum)
Subjective: CC: Sedated on vent Febrile to 102.1 and tachycardic in the low 100's Seen with CCM. Patient off Epi. Still on Levo and Vaso. Last ABG improved w/ correction of acidosis (7.377) and improved acid-base to 3.0 from 6.0. Lactic acid was downtrending when last checked (8.6 > 6.5 > 5.2). WBC 29.2 > 7 > 13.1 Foley placed by urology yesterday. UCx pending. Bcx NGTD NGT 350/24 hours. No BM yesterday CCM discussed with family - currently full code  Objective: Vital signs in last 24 hours: Temp:  [98 F (36.7 C)-102.1 F (38.9 C)] 102.1 F (38.9 C) (03/03 0748) Pulse Rate:  [98-144] 106 (03/03 0715) Resp:  [8-33] 26 (03/03 0715) BP: (60-170)/(29-151) 105/57 (03/03 0700) SpO2:  [73 %-100 %] 95 % (03/03 0715) Arterial Line BP: (65-133)/(39-60) 116/51 (03/03 0715) FiO2 (%):  [40 %-60 %] 60 % (03/03 0400) Weight:  [77.2 kg] 77.2 kg (03/03 0600)    Intake/Output from previous day: 03/02 0701 - 03/03 0700 In: 6148 [I.V.:3773.7; NG/GT:50; IV Piggyback:2294.4] Out: 1275 [Urine:1075; Emesis/NG output:350] Intake/Output this shift: No intake/output data recorded.  Vent Mode: PSV;CPAP FiO2 (%):  [40 %-60 %] 50 % Set Rate:  [24 bmp-32 bmp] 24 bmp Vt Set:  [540 mL-580 mL] 540 mL PEEP:  [5 cmH20-10 cmH20] 8 cmH20 Pressure Support:  [14 cmH20-15 cmH20] 14 cmH20 Plateau Pressure:  [20 TZG01-74 cmH20] 23 cmH20   PE: Gen:  Sedated on vent Card:  Tachycardic  Pulm:  Vent setting as above Abd: No obvious distention and soft.  No rigidity or guarding.  Prior midline scar well-healed.  Left inguinal hernia easily reducible.    Lab Results:  Recent Labs    03/02/21 1558 03/02/21 1658 03/03/21 0528 03/03/21 0530  WBC 7.0  --   --  13.1*  HGB 12.3*   < > 12.9* 12.0*  HCT 40.2   < > 38.0* 38.7*  PLT 310  --   --  272   < > = values in this interval not displayed.   BMET Recent Labs    03/02/21 1558 03/02/21 1658 03/03/21 0528 03/03/21 0530  NA 138   < > 136 134*   K 4.6   < > 4.8 4.8  CL 102  --   --  99  CO2 19*  --   --  21*  GLUCOSE 231*  --   --  217*  BUN 60*  --   --  62*  CREATININE 3.36*  --   --  3.06*  CALCIUM 7.8*  --   --  7.0*   < > = values in this interval not displayed.   PT/INR Recent Labs    03/02/21 0512  LABPROT 15.3*  INR 1.2   CMP     Component Value Date/Time   NA 134 (L) 03/03/2021 0530   K 4.8 03/03/2021 0530   CL 99 03/03/2021 0530   CO2 21 (L) 03/03/2021 0530   GLUCOSE 217 (H) 03/03/2021 0530   BUN 62 (H) 03/03/2021 0530   CREATININE 3.06 (H) 03/03/2021 0530   CALCIUM 7.0 (L) 03/03/2021 0530   PROT 5.4 (L) 03/02/2021 1558   ALBUMIN 2.3 (L) 03/02/2021 1558   AST 39 03/02/2021 1558   ALT 22 03/02/2021 1558   ALKPHOS 67 03/02/2021 1558   BILITOT 1.4 (H) 03/02/2021 1558   GFRNONAA 20 (L) 03/03/2021 0530   GFRAA >60 01/28/2018 0211   Lipase     Component Value Date/Time  LIPASE 32 01/27/2018 0004    Studies/Results: DG Abd 1 View  Result Date: 03/02/2021 CLINICAL DATA:  Feeding tube placement. EXAM: ABDOMEN - 1 VIEW COMPARISON:  CT abdomen pelvis from same day. Chest x-ray from same day. FINDINGS: The enteric tube appears slightly retracted compared to CT from earlier today with the proximal side port now in the distal esophagus. Unchanged small bowel dilatation in the upper abdomen. Patchy opacities in both upper lobes and the left lower lobe appear progressed since 0530 hours. IMPRESSION: 1. Enteric tube with the proximal side port now in the distal esophagus. Recommend advancement by at least 6 cm. 2. Progressive bilateral bilateral upper and left lower lobe airspace disease, concerning for multifocal pneumonia/aspiration. Electronically Signed   By: Titus Dubin M.D.   On: 03/02/2021 09:10   DG Chest Port 1 View  Result Date: 03/03/2021 CLINICAL DATA:  76 year old male with sepsis.  Intubated. EXAM: PORTABLE CHEST 1 VIEW COMPARISON:  Portable chest 03/02/2021 and earlier. FINDINGS: Portable AP semi  upright view at 0447 hours. Endotracheal tube tip in good position between the level the clavicles and carina. Enteric tube courses to the abdomen, tip not included. Stable right IJ central line. Since 0506 hours yesterday development of confluent biapical, left perihilar and lower lung opacity. Underlying chronic increased interstitial markings. No convincing pneumothorax. Possible pneumo mediastinum on the portable chest yesterday is less apparent, and there is no obvious chest wall or supraclavicular gas. Stable cardiac size and mediastinal contours. Right mid and lower lung appear to remain at baseline. No pleural effusion is evident. Stable visualized osseous structures. IMPRESSION: 1. Satisfactory lines and tubes. 2. Acute multifocal left greater than right Patchy and confluent pulmonary opacity since 0506 hours yesterday compatible with bilateral pneumonia. Underlying chronic lung disease. No definite pleural effusion. 3. No convincing pneumothorax or subcutaneous emphysema, and possible pneumomediastinum on the portable chest yesterday is less apparent. Electronically Signed   By: Genevie Ann M.D.   On: 03/03/2021 06:29   DG CHEST PORT 1 VIEW  Result Date: 03/02/2021 CLINICAL DATA:  Placement of right IJ central venous catheter EXAM: PORTABLE CHEST 1 VIEW COMPARISON:  Previous studies including the studies done earlier today FINDINGS: Cardiac size is within normal limits. Extensive patchy infiltrates are seen in both lungs with possible worsening of infiltrate in the left parahilar region and some improvement in aeration in the medial right lower lung fields. There is interval placement of central venous catheter through the right internal jugular with its tip in the superior vena cava. Tip of endotracheal tube is 3.7 cm above the carina. Enteric tube is noted traversing the esophagus. Tip of left PICC line is seen in the axilla. There is small linear area of lucency in the left margin of mediastinum at the  level of aortic arch. There is possible minimal radiolucency in the left apex and along the lateral aspect of left upper lung fields. There is a catheter in the region of left IJ with its tip probably in the upper neck. IMPRESSION: Tip of right IJ central venous catheter is seen in the superior vena cava. There is no evidence of right pneumothorax. There are small faint radiolucencies in the left apex and along the lateral left upper lung fields. Possibility of small apical left pneumothorax is not excluded. There is radiolucency adjacent to the mediastinal margin on the left side at the level of aortic arch. This may suggest small loculated pneumothorax or pneumomediastinum. Short-term follow-up chest radiographs should be considered to  evaluate these findings. Extensive patchy infiltrates are seen in the both lungs with interval worsening in the left parahilar region and possible improvement in the medial right lower lung fields. Findings suggest multifocal pneumonia. These results will be called to the ordering clinician or representative by the Radiologist Assistant, and communication documented in the PACS or [employer]. Electronically Signed   By: Elmer Picker M.D.   On: 03/02/2021 17:45   DG CHEST PORT 1 VIEW  Result Date: 03/02/2021 CLINICAL DATA:  Placement of central venous catheter EXAM: PORTABLE CHEST 1 VIEW COMPARISON:  Previous study done earlier today FINDINGS: Cardiac size is within normal limits. Tip of endotracheal tube is proximally 2.6 cm above the carina. Enteric tube is noted traversing the esophagus with side port at the gastroesophageal junction. There is a catheter in the left lower neck, possibly left IJ catheter with its tip in more cephalad region of the neck or structure outside the patient's body. There is PICC line in the left axillary region. Exact location of the tip of the PICC line is not clearly seen. There is interval worsening of patchy infiltrates in both lungs.  There is minimal blunting of left lateral CP angle. There is no pneumothorax. IMPRESSION: There is interval worsening of patchy alveolar infiltrates in both lungs suggesting worsening of multifocal pneumonia. Possible minimal left pleural effusion. PICC line is seen in the left axilla. Tip of PICC line could not be clearly visualized. There is a catheter in the region of the left IJ in the lower neck. If it is a left IJ central venous catheter, it is coiled in the lower neck with its tip probably in more cephalad portion of the neck. Another possibility is that this is a structure outside the patient's body. Electronically Signed   By: Elmer Picker M.D.   On: 03/02/2021 15:35   DG CHEST PORT 1 VIEW  Result Date: 03/02/2021 CLINICAL DATA:  Feeding tube placement EXAM: PORTABLE CHEST 1 VIEW COMPARISON:  Earlier same day FINDINGS: Enteric tube passes below the diaphragm with tip out of field of view. Endotracheal tube may be present. Similar lung aeration. Stable cardiomediastinal contours. No pleural effusion or pneumothorax. IMPRESSION: Enteric tube passes below the diaphragm with tip out of field of view. Similar lung aeration. Electronically Signed   By: Macy Mis M.D.   On: 03/02/2021 09:11   DG Chest Portable 1 View  Result Date: 03/02/2021 CLINICAL DATA:  76 year old male intubated. EXAM: PORTABLE CHEST 1 VIEW COMPARISON:  Portable chest 0505 hours. FINDINGS: Portable AP supine view at 0532 hours. Thoracic inlet not included. An enteric tube courses to the abdomen, but the tip is not included. Larger lung volumes. But no endotracheal tube identified on this image. Coarse upper lobe reticulonodular opacity again noted. Elsewhere the lungs appear clear. Normal cardiac size and mediastinal contours. No acute osseous abnormality identified. IMPRESSION: 1. Thoracic inlet excluded and no endotracheal tube is visible. Recommend repeat portable chest radiograph. 2. Larger lung volumes with no acute  pulmonary abnormality identified. 3. Enteric tube courses to the abdomen, tip not included. Electronically Signed   By: Genevie Ann M.D.   On: 03/02/2021 05:44   DG Chest Portable 1 View  Result Date: 03/02/2021 CLINICAL DATA:  76 year old male with cough, fever, vomiting. EXAM: PORTABLE CHEST 1 VIEW COMPARISON:  07/05/2017 chest radiographs and earlier. FINDINGS: Portable AP semi upright view at 0506 hours. Chronic lung disease with striking asymmetric reticulonodular opacity demonstrated on 2018 chest CT. Lung markings do  not appear significantly changed from that time when allowing for portable technique. Mediastinal contours remain normal. No pneumothorax, pleural effusion or consolidation. Background pulmonary vascularity appears normal. No acute osseous abnormality identified. Paucity of bowel gas in the upper abdomen. IMPRESSION: Chronic lung disease as demonstrated on a 2018 CT. No acute cardiopulmonary abnormality identified. Electronically Signed   By: Genevie Ann M.D.   On: 03/02/2021 05:34   CT Renal Stone Study  Result Date: 03/02/2021 CLINICAL DATA:  64-YEAR-OLD male with persistent fever, found down covered in vomit. Possible sepsis. Left inguinal hernia thought to be reduced following this scan. EXAM: CT ABDOMEN AND PELVIS WITHOUT CONTRAST TECHNIQUE: Multidetector CT imaging of the abdomen and pelvis was performed following the standard protocol without IV contrast. RADIATION DOSE REDUCTION: This exam was performed according to the departmental dose-optimization program which includes automated exposure control, adjustment of the mA and/or kV according to patient size and/or use of iterative reconstruction technique. COMPARISON:  CT Abdomen and Pelvis 06/10/2020.  Chest CT 05/02/2016. FINDINGS: Lower chest: Chronic lung base scarring, bronchial wall thickening and peribronchial nodularity appears stable from last year. No cardiomegaly, no pericardial effusion, no pleural effusion. However, the distal  esophagus demonstrates circumferential wall thickening or alternatively fluid distension. There is an enteric tube passing through the esophagus and terminating in the proximal stomach. Hepatobiliary: Negative noncontrast liver and gallbladder. Pancreas: Negative. Spleen: Decreased spleen size from last year, otherwise negative. Adrenals/Urinary Tract: Negative adrenal glands. Nonobstructed kidneys with chronic renal vascular calcifications. Decompressed ureters. No acute pararenal inflammation. Diminutive bladder with some chronic wall thickening. Numerous pelvic phleboliths. Stomach/Bowel: Nondilated large bowel, decompressed throughout much of the abdomen and pelvis. Intermittent diverticulosis but no active large bowel inflammation. Decompressed terminal ileum and small bowel loops in the pelvis. Transition point at a small bowel containing left inguinal hernia, with upstream fluid-filled dilated small bowel loops up to 30 mm. Upstream small bowel is dilated to the ligament of Treitz. Enteric tube terminates in the stomach. Stomach and duodenum appear more normal. No free air. No free fluid in the abdomen. Vascular/Lymphatic: Extensive Aortoiliac calcified atherosclerosis. Normal caliber abdominal aorta. Vascular patency is not evaluated in the absence of IV contrast. No lymphadenopathy identified. Reproductive: Left inguinal hernia containing a small bowel loop and complex extraluminal fluid (coronal image 45). Possible pneumatosis within the herniated loop on coronal image 50, and mural thickening of the small bowel there. Other: No free fluid in the pelvis. Musculoskeletal: No acute osseous abnormality identified. Chronic lumbar spine degeneration. IMPRESSION: 1. Positive for Small-bowel Obstruction secondary to an incarcerated small bowel containing left inguinal hernia. Possible pneumatosis in the herniated bowel loop and complex free fluid in the hernia sac, suspicious for strangulation/ischemic bowel.  Correlate with serum lactate. No free air or free fluid elsewhere in the abdomen or pelvis. This was discussed by telephone with Dr. Veatrice Kells on 03/02/2021 at 0625 hours. 2. Enteric tube terminates in the stomach which is relatively decompressed. But there is either generalized wall thickening or fluid distension of the distal esophagus. 3. Decompressed colon with intermittent diverticulosis. Stable lung bases with chronic lung disease. Extensive Aortic Atherosclerosis (ICD10-I70.0). Electronically Signed   By: Genevie Ann M.D.   On: 03/02/2021 06:37    Anti-infectives: Anti-infectives (From admission, onward)    Start     Dose/Rate Route Frequency Ordered Stop   03/02/21 1600  piperacillin-tazobactam (ZOSYN) IVPB 2.25 g        2.25 g 100 mL/hr over 30 Minutes Intravenous Every 8 hours  03/02/21 0720     03/02/21 0730  piperacillin-tazobactam (ZOSYN) IVPB 3.375 g        3.375 g 100 mL/hr over 30 Minutes Intravenous STAT 03/02/21 0720 03/02/21 0809   03/02/21 0715  piperacillin-tazobactam (ZOSYN) IVPB 3.375 g  Status:  Discontinued        3.375 g 12.5 mL/hr over 240 Minutes Intravenous STAT 03/02/21 0711 03/02/21 0719   03/02/21 0515  ceFEPIme (MAXIPIME) 2 g in sodium chloride 0.9 % 100 mL IVPB        2 g 200 mL/hr over 30 Minutes Intravenous  Once 03/02/21 0502 03/02/21 0658   03/02/21 0515  metroNIDAZOLE (FLAGYL) IVPB 500 mg  Status:  Discontinued        500 mg 100 mL/hr over 60 Minutes Intravenous  Once 03/02/21 0502 03/02/21 0702   03/02/21 0515  vancomycin (VANCOCIN) IVPB 1000 mg/200 mL premix  Status:  Discontinued        1,000 mg 200 mL/hr over 60 Minutes Intravenous  Once 03/02/21 0502 03/02/21 0510   03/02/21 0515  vancomycin (VANCOREADY) IVPB 1500 mg/300 mL        1,500 mg 150 mL/hr over 120 Minutes Intravenous  Once 03/02/21 0510 03/02/21 1033        Assessment/Plan Incarcerated left inguinal hernia that is currently reduced. Possible pneumatosis in the herniated bowel loop  on CT - At this time the patient's hernia is currently reduced.  Please see Dr. Johney Frame note from yesterday.  Patient is currently with acute respiratory failure on the vent and shock requiring pressure support likely secondary to multifocal pneumonia.  His labs are improving.  Patient is high risk for any operative intervention.  Discussed with attending.  Would continue with conservative management at this time and continued resuscitation by CCM.  He is already on antibiotics for his pneumonia. Cont NGT to [facility]. We will continue to follow along closely.  FEN - NPO, NGT, IVF per TRH VTE - SCDs, okay for chemical prophylaxis from our standpoint ID - Zosyn Foley - In place   Shock - Off Epi. Still on Levo and Vaso. Last ABG improved w/ correction of acidosis (7.377) and improved acid-base to 3.0 from 6.0. Lactic acid was downtrending when last checked (8.6 > 6.5 > 5.2). WBC 29.2 > 7 > 13.1. Still febrile. On abx for multifocal pna. Bcx and Ucx pending.  Acute resp failure - On vent, per CCM Multifocal PNA - on abx. Per CCM Acute urinary retention - s/p foley placement by urology 3/2. Ucx pending HTN COPD Hx CVA Hx bladder CA  This care required high  level of medical decision making.    LOS: 1 day    Jillyn Ledger , [facility] 03/03/2021, 8:14 AM Please see Amion for pager number during day hours 7:00am-4:30pm

## 2021-03-03 NOTE — Progress Notes (Signed)
RT note-Patient placed back to full support, Sp02 91-92%. Continue to monitor. ?

## 2021-03-03 NOTE — Progress Notes (Addendum)
? ?[name], MRN:  [id number], DOB:  1945/07/03, LOS: 1 ?ADMISSION DATE:  03/02/2021, CONSULTATION DATE:  03/03/2021 ?REFERRING MD:  Spero Geralds, MD CHIEF COMPLAINT:  SOB  ? ?History of Present Illness:  ?Pt is a 76 yo m w/ PMhx of dementia, bladder cancer presenting from SNF for SOB and emesis since early Tuesday AM. On arrival he was found to be somnolent w/ rhonchus breathing, sats in low 70s. Immediately intubated by ED MD and found feculent emesis throughout oropharynx and airway. Upon OGT placement had ~ 1L of immediate output. Per reports pt had associated tachypnea, fever and emesis since early Tuesday AM as well. No family present.  ? ?CT ab/pelvis on my review concerning for pneumatosis & SBO involving left inguinal & scrotum. Final read pending. ED MD was able to reduce palpable anterior abdominal wall portion however scrotum remains full. ED nursing unable to pass foley despite multiple attempts.  ? ?Pertinent  Medical History  ?Dementia  ?? Hx of Bladder cancer  ? ?Significant Hospital Events: ?Including procedures, antibiotic start and stop dates in addition to other pertinent events   ?3/2: admission for septic shock; central line and a-line placed ?3/3: decreased pressor and vent requirement. Echo with normal EF; G2DD. Moderate RV enlargement ?Interim History / Subjective:  ?Decreased pressor requirement overnight. Decreasing vent support.  ? ?Labs reviewed: acidosis resolved on this morning's ABG; white count down to 13; hemoglobin stable. BUN/Creatinine 62/3.0. Glucose 217. ? ?NGTD on 3/2 blood cultures.  ?Objective   ?Blood pressure (!) 117/58, pulse (!) 101, temperature (!) 102.1 ?F (38.9 ?C), temperature source Oral, resp. rate (!) 27, height 5\' 8"  (1.727 m), weight 77.2 kg, SpO2 92 %. ?   ?Vent Mode: PRVC ?FiO2 (%):  [40 %-60 %] 50 % ?Set Rate:  [24 bmp-32 bmp] 24 bmp ?Vt Set:  [540 mL-580 mL] 540 mL ?PEEP:  [5 cmH20-10 cmH20] 10 cmH20 ?Pressure Support:  [14 cmH20-15 cmH20] 14  cmH20 ?Plateau Pressure:  [20 KGY18-56 cmH20] 23 cmH20  ? ?Intake/Output Summary (Last 24 hours) at 03/03/2021 1034 ?Last data filed at 03/03/2021 0900 ?Gross per 24 hour  ?Intake 5050.14 ml  ?Output 1425 ml  ?Net 3625.14 ml  ? ? ?Filed Weights  ? 03/02/21 0504 03/03/21 0600  ?Weight: 75 kg 77.2 kg  ? ? ?Examination: ?General: intubated  ?HENT: ETT in place, OGT with dark feculent output  ?Lungs: on 5 of PEEP 60% FiO2. Course breath sounds throughout.  ?Cardiovascular: regular, tachycardic, no MRG  ?Abdomen: soft, distended, absent bowel sounds, midline incision scar  ?Extremities: warm to touch, no LE edema  ?Neuro: intubated ?GU: large, soft scrotum on left ? ?Resolved Hospital Problem list   ? ?Assessment & Plan:  ?Acute hypoxic respiratory failure requiring mechanical ventilation due to ARDS secondary to multifocal pneumonia complicated by septic shock ?- Decreasing vent support needs overnight. He did not tolerate the 4-6cc/kg yesterday however we will get him closer to that today ?- Off versed ?- Wean fentanyl as able ?- Decreasing pressor support needs overnight. Off epi; on levo and vasopressin. Continue to wean as able for MAP goal of >7mmHg ?- Continue zosyn ?- Follow blood cultures ? ?SBO with incarcerated hernia and evidence of ischemia/pneumatosis.  Reduced in the ED.  ?- primary management per surgery.  ?- continue OGT to LIS  ?- strict NPO  ?- replete mag ? ?Renal failure due to hypovolemia and septic shock ?-Sodium bicarbonate infusion ?-Foley placed by urology, appreciate assistance ?- Follow  UC ?- Fluid repletion ? ?GERD ?- PPI  ? ?Dementia  ?- at baseline A&O x3 per daughter, can ambulate, performs no ADLs, does not communicate, cannot speak but minimal speaking ? ?[place] - per discussion with daughter, FULL code  ? ?Nutrition Status: ?Nutrition Problem: Moderate Malnutrition ?Etiology: chronic illness (COPD) ?Signs/Symptoms: moderate fat depletion, moderate muscle depletion ?Interventions: Refer to RD  note for recommendations ?  ? ? ?Best Practice (right click and "Reselect all SmartList Selections" daily)  ? ?Diet/type: NPO ?DVT prophylaxis: LMWH ?GI prophylaxis: PPI ?Lines: N/A ?Foley:  Yes, and it is still needed ?Code Status:  full code ?Last date of multidisciplinary goals of care discussion [03/02/21 with daughter, explained multi organ failure and refractory shock, worsening.  Worsening chest x-ray.  Recommended DNR.  She expressed understanding.  She stated she wants [place] to perform resuscitative efforts.] ? ?Jason Hansen, MD ?Internal Medicine Resident PGY-3 ?[name] ?Pager: [phone] ?03/03/2021 10:54 AM  ?  ?

## 2021-03-04 DIAGNOSIS — E440 Moderate protein-calorie malnutrition: Secondary | ICD-10-CM | POA: Diagnosis not present

## 2021-03-04 DIAGNOSIS — K56609 Unspecified intestinal obstruction, unspecified as to partial versus complete obstruction: Secondary | ICD-10-CM | POA: Diagnosis not present

## 2021-03-04 DIAGNOSIS — A419 Sepsis, unspecified organism: Secondary | ICD-10-CM | POA: Diagnosis not present

## 2021-03-04 DIAGNOSIS — R6521 Severe sepsis with septic shock: Secondary | ICD-10-CM | POA: Diagnosis not present

## 2021-03-04 LAB — COMPREHENSIVE METABOLIC PANEL
ALT: 22 U/L (ref 0–44)
AST: 42 U/L — ABNORMAL HIGH (ref 15–41)
Albumin: 1.9 g/dL — ABNORMAL LOW (ref 3.5–5.0)
Alkaline Phosphatase: 88 U/L (ref 38–126)
Anion gap: 14 (ref 5–15)
BUN: 67 mg/dL — ABNORMAL HIGH (ref 8–23)
CO2: 27 mmol/L (ref 22–32)
Calcium: 6.5 mg/dL — ABNORMAL LOW (ref 8.9–10.3)
Chloride: 94 mmol/L — ABNORMAL LOW (ref 98–111)
Creatinine, Ser: 3.01 mg/dL — ABNORMAL HIGH (ref 0.61–1.24)
GFR, Estimated: 21 mL/min — ABNORMAL LOW (ref >60)
Glucose, Bld: 148 mg/dL — ABNORMAL HIGH (ref 70–99)
Potassium: 3.9 mmol/L (ref 3.5–5.1)
Sodium: 135 mmol/L (ref 135–145)
Total Bilirubin: 0.6 mg/dL (ref 0.3–1.2)
Total Protein: 5.2 g/dL — ABNORMAL LOW (ref 6.5–8.1)

## 2021-03-04 LAB — POCT I-STAT 7, (LYTES, BLD GAS, ICA,H+H)
Acid-Base Excess: 7 mmol/L — ABNORMAL HIGH (ref 0.0–2.0)
Acid-Base Excess: 8 mmol/L — ABNORMAL HIGH (ref 0.0–2.0)
Bicarbonate: 31.6 mmol/L — ABNORMAL HIGH (ref 20.0–28.0)
Bicarbonate: 31.9 mmol/L — ABNORMAL HIGH (ref 20.0–28.0)
Calcium, Ion: 0.87 mmol/L — CL (ref 1.15–1.40)
Calcium, Ion: 0.92 mmol/L — ABNORMAL LOW (ref 1.15–1.40)
HCT: 33 % — ABNORMAL LOW (ref 39.0–52.0)
HCT: 33 % — ABNORMAL LOW (ref 39.0–52.0)
Hemoglobin: 11.2 g/dL — ABNORMAL LOW (ref 13.0–17.0)
Hemoglobin: 11.2 g/dL — ABNORMAL LOW (ref 13.0–17.0)
O2 Saturation: 97 %
O2 Saturation: 97 %
Patient temperature: 99.6
Patient temperature: 98.2
Potassium: 3.9 mmol/L (ref 3.5–5.1)
Potassium: 3.8 mmol/L (ref 3.5–5.1)
Sodium: 136 mmol/L (ref 135–145)
Sodium: 136 mmol/L (ref 135–145)
TCO2: 33 mmol/L — ABNORMAL HIGH (ref 22–32)
TCO2: 33 mmol/L — ABNORMAL HIGH (ref 22–32)
pCO2 arterial: 46.5 mmHg (ref 32–48)
pCO2 arterial: 42.3 mmHg (ref 32–48)
pH, Arterial: 7.442 (ref 7.35–7.45)
pH, Arterial: 7.484 — ABNORMAL HIGH (ref 7.35–7.45)
pO2, Arterial: 93 mmHg (ref 83–108)
pO2, Arterial: 83 mmHg (ref 83–108)

## 2021-03-04 LAB — GLUCOSE, CAPILLARY
Glucose-Capillary: 142 mg/dL — ABNORMAL HIGH (ref 70–99)
Glucose-Capillary: 133 mg/dL — ABNORMAL HIGH (ref 70–99)
Glucose-Capillary: 130 mg/dL — ABNORMAL HIGH (ref 70–99)
Glucose-Capillary: 114 mg/dL — ABNORMAL HIGH (ref 70–99)
Glucose-Capillary: 136 mg/dL — ABNORMAL HIGH (ref 70–99)
Glucose-Capillary: 116 mg/dL — ABNORMAL HIGH (ref 70–99)

## 2021-03-04 LAB — MAGNESIUM: Magnesium: 2.8 mg/dL — ABNORMAL HIGH (ref 1.7–2.4)

## 2021-03-04 LAB — CBC
HCT: 33 % — ABNORMAL LOW (ref 39.0–52.0)
Hemoglobin: 10.8 g/dL — ABNORMAL LOW (ref 13.0–17.0)
MCH: 22.4 pg — ABNORMAL LOW (ref 26.0–34.0)
MCHC: 32.7 g/dL (ref 30.0–36.0)
MCV: 68.5 fL — ABNORMAL LOW (ref 80.0–100.0)
Platelets: 192 10*3/uL (ref 150–400)
RBC: 4.82 MIL/uL (ref 4.22–5.81)
RDW: 14.8 % (ref 11.5–15.5)
WBC: 13 10*3/uL — ABNORMAL HIGH (ref 4.0–10.5)
nRBC: 0 % (ref 0.0–0.2)

## 2021-03-04 MED ORDER — CALCIUM GLUCONATE-NACL 1-0.675 GM/50ML-% IV SOLN
1.0000 g | Freq: Once | INTRAVENOUS | Status: AC
  Administered 2021-03-04: 1000 mg via INTRAVENOUS
  Filled 2021-03-04: qty 50

## 2021-03-04 NOTE — Progress Notes (Addendum)
? ?Jason [name], MRN:  [id number], DOB:  December 30, 1945, LOS: 2 ?ADMISSION DATE:  03/02/2021, CONSULTATION DATE:  03/04/2021 ?REFERRING MD:  [name], MD CHIEF COMPLAINT:  SOB  ? ?History of Present Illness:  ?Pt is a 76 yo m w/ PMhx of dementia, bladder cancer presenting from SNF for SOB and emesis since early Tuesday AM. On arrival he was found to be somnolent w/ rhonchus breathing, sats in low 70s. Immediately intubated by ED MD and found feculent emesis throughout oropharynx and airway. Upon OGT placement had ~ 1L of immediate output. Per reports pt had associated tachypnea, fever and emesis since early Tuesday AM as well. No family present.  ? ?CT ab/pelvis on my review concerning for pneumatosis & SBO involving left inguinal & scrotum. Final read pending. ED MD was able to reduce palpable anterior abdominal wall portion however scrotum remains full. ED nursing unable to pass foley despite multiple attempts.  ? ?Pertinent  Medical History  ?Dementia  ?? Hx of Bladder cancer  ? ?Significant Hospital Events: ?Including procedures, antibiotic start and stop dates in addition to other pertinent events   ?3/2: admission for septic shock; central line and a-line placed ?3/3: decreased pressor and vent requirement. Echo with normal EF; G2DD. Moderate RV enlargement; P/F 153 ?3/4: off levo; on vaso alone. P/F 186 ?Interim History / Subjective:  ?Decreased pressor requirement overnight.  ?Bicarb stopped around 0400 this morning due to increased bicarb ? ?Cumulative Net + 6.5L.  ?24H UOP 1600cc. ?24H NG output 300cc ? ?Labs reviewed:  ABG pH 7.44, CO2 46, O2 93, Bicarb 32. WBC 13>13; hemoglobin 13>12>10.8 today. BUN/Creatinine 67/3.0. corrected calcium 8.2. ? ?NGTD on 3/2 blood cultures.  ?Objective   ?Blood pressure 128/65, pulse 100, temperature (!) 101.4 ?F (38.6 ?C), temperature source Oral, resp. rate (!) 29, height '5\' 8"'$  (1.727 m), weight 79.1 kg, SpO2 96 %. ?   ?Vent Mode: PRVC ?FiO2 (%):  [40 %-50 %] 40 % ?Set  Rate:  [24 bmp] 24 bmp ?Vt Set:  [410 mL-540 mL] 410 mL ?PEEP:  [10 cmH20] 10 cmH20 ?Plateau Pressure:  [10 cmH20-16 cmH20] 10 cmH20  ? ?Intake/Output Summary (Last 24 hours) at 03/04/2021 0846 ?Last data filed at 03/04/2021 0800 ?Gross per 24 hour  ?Intake 3814.81 ml  ?Output 2125 ml  ?Net 1689.81 ml  ? ? ?Filed Weights  ? 03/02/21 0504 03/03/21 0600 03/04/21 0500  ?Weight: 75 kg 77.2 kg 79.1 kg  ? ? ?Examination: ?General: intubated, critically ill appearing.  ?HENT: ETT in place, OGT with dark feculent output  ?Lungs: course breath sounds throughout.  ?Cardiovascular: irregular, tachycardic, no LE edema ?Abdomen: soft, distended, absent bowel sounds, midline incision scar  ?Extremities: warm to touch, no LE edema  ?Neuro: sedated on fentanyl 55mg/hr. Does not awaken to voice or physical stimulation. Withdraws to pain in all 4 extremities.  ?GU: large, soft scrotum on left ? ?Resolved Hospital Problem list   ? ?Assessment & Plan:  ? ?Moderate ARDS secondary to multifocal pneumonia complicated by septic shock ?P/F 186 this morning. ?NGTD on 3/2 BC ?- Wean fentanyl as able ?- Continues to wean well off pressor support. Now off epi and levo. Wean off vaso as able to maintain MAP >660mg ?- Continue zosyn ?- Follow blood and urine cultures ? ?SBO with incarcerated hernia and evidence of ischemia/pneumatosis.  Reduced in the ED.  ?NG output slowly decreasing. ?- primary management per surgery.  ?- continue OGT to LIS  ?- strict NPO  ?-  replete mag ? ?Acute Renal failure due to hypovolemia and septic shock, urethral stricture ?Bicarb drip turned off at 0400 ?- Recheck bicarb on ABG ?- Foley placed by urology, appreciate assistance ?- Follow UC ? ?Hypocalcemia. 8.2 corrected. Monitor ? ?Afib RVR. Rate controlled on amio.  ?- Continue current management ? ?GERD ?- PPI  ? ?Dementia  ?- at baseline A&O x3 per daughter, can ambulate, performs no ADLs, does not communicate, cannot speak but minimal speaking ? ?Nutrition  Status: ?Nutrition Problem: Moderate Malnutrition ?Etiology: chronic illness (COPD) ?Signs/Symptoms: moderate fat depletion, moderate muscle depletion ?Interventions: Refer to RD note for recommendations ?  ? ?Best Practice (right click and "Reselect all SmartList Selections" daily)  ? ?Diet/type: NPO ?DVT prophylaxis: LMWH ?GI prophylaxis: PPI ?Lines: N/A ?Foley:  Yes, and it is still needed ?Code Status:  full code ?Family updated 3/3. ? ?[name], MD ?Internal Medicine Resident PGY-3 ?[name] ?Pager: [phone] ?03/04/2021 8:46 AM  ?  ? ? ? ?Critical care attending attestation note: ?I agree with [name], MD note, impression, and recommendations as outlined. I have taken an independent interval history, reviewed the chart and examined the patient. The following reflects my medical decision making and independent critical care time ? ? ?Synopsis of assessment and plan: ?-year-old male here with acute hypoxic/hypercapnic respiratory failure in the setting of ARDS with multifocal pneumonia and septic shock ? ?Patient just came off of vasopressors, he remained afebrile, remains on ventilatory support ?Patient spiked fever with Tmax 101.4 ?  ?Physical exam: ?General: Critically ill-appearing male, orally intubated ?HEENT: [place]/AT, eyes anicteric.  ETT and OGT in place ?Neuro: Eyes closed, does not open, not following commands  ?Chest: Bilateral rhonchorous sounds right more than left, no wheezes or crackles ?Heart: Regular rate and rhythm, no murmurs or gallops ?Abdomen: Soft, nontender, nondistended, bowel sounds present ?Skin: No rash ? ?Labs and images were reviewed ? ?Assessment and plan: ?Acute hypoxic/hypercapnic respiratory failure ?ARDS due to multifocal pneumonia ?Septic shock ?Small bowel obstruction with incarcerated hernia ?Acute kidney injury due to his ischemic ATN ?Hypocalcemia/hyponatremia ?Dementia ?Paroxysmal A-fib with RVR ?Moderate  malnutrition ? ?Continue lung protective ventilation ?Patient is tolerating spontaneous breathing trials but his mental status preventing [place] from extubation ?Vent setting was adjusted to clear hypercapnia ?ARDS has improved ?He just came off of vasopressors ?Continue broad-spectrum antibiotics ?Appreciate general surgery follow-up, patient has a reducible hernia now with numerous doses ?Closely monitor electrolytes and supplement ?Heart rate remained controlled ?Continue amiodarone ? ?CRITICAL CARE ?Performed by: [name] ? ? ?Total independent critical care time: 41 minutes ? ?Critical care time was exclusive of separately billable procedures and treating other patients. ? ?Critical care was necessary to treat or prevent imminent or life-threatening deterioration. ? ? ?Critical care was time spent personally by me on the following activities: development of treatment plan with patient and/or surrogate as well as nursing, discussions with consultants, evaluation of patient's response to treatment, examination of patient, obtaining history from patient or surrogate, ordering and performing treatments and interventions, ordering and review of laboratory studies, ordering and review of radiographic studies, pulse oximetry, re-evaluation of patient's condition and participation in multidisciplinary rounds. ? ?[name] MD ?[facility] ?See Amion for pager ?If no response to pager, please call [phone] until 7pm ?After 7pm, Please call E-link [phone] ? ?03/04/2021, 1:22 PM ? ? ?If no response to pager, please call [phone] until 7pm ?After 7pm, Please call E-link [phone] ? ?

## 2021-03-04 NOTE — Progress Notes (Signed)
RT note-Changed over to wean by Dr. Tacy Learn, patient is tolerating wean at this time, continue to monitor. ?

## 2021-03-04 NOTE — Progress Notes (Signed)
RT note- Patient is continuing to still tolerate wean. ?

## 2021-03-04 NOTE — Progress Notes (Signed)
eLink Physician-Brief Progress Note ?Patient Name: Jason [name] ?DOB: 1945/08/04 ?MRN: [id number] ? ? ?Date of Service ? 03/04/2021  ?HPI/Events of Note ? 76 y/o M with Acute hypoxemic respiratory failure, Septic shock secondary to incarcerated hernia and SBO, AKI with acidosis.  ?  ?Has been on NG tube to LWS. On zosyn and bicarbonate infusion  ?Called by RN re: ABG with Bicarb 32  ?eICU Interventions ? -  likely due to NGT suction with HcL loss + bicarbonate infusion  ?- hold bicarb infusion (running at 75cc/hr)  ?- resume if needed based on AM - ABG  ? ? ? ? Basilia Jumbo, M.D.  ?03/04/2021, 4:46 AM ?

## 2021-03-04 NOTE — Progress Notes (Signed)
Subjective: CC: Sedated on vent Febrile to 101.4, tachycardia a bit better Some pressors and acidosis has cleared 300 out of NG last 24 hours.  No BM recorded  Objective: Vital signs in last 24 hours: Temp:  [98.1 F (36.7 C)-101.6 F (38.7 C)] 101.4 F (38.6 C) (03/04 0740) Pulse Rate:  [86-182] 103 (03/04 0900) Resp:  [24-30] 29 (03/04 0900) BP: (92-151)/(56-80) 122/71 (03/04 0900) SpO2:  [88 %-96 %] 92 % (03/04 0900) Arterial Line BP: (93-138)/(43-57) 119/47 (03/04 0700) FiO2 (%):  [40 %-50 %] 40 % (03/04 0800) Weight:  [79.1 kg] 79.1 kg (03/04 0500)    Intake/Output from previous day: 03/03 0701 - 03/04 0700 In: 3926.6 [I.V.:3545.6; NG/GT:30; IV Piggyback:351] Out: 30 [Urine:1600; Emesis/NG output:300] Intake/Output this shift: Total I/O In: 44.9 [I.V.:30.8; IV Piggyback:14.1] Out: 355 [Urine:355]  Vent Mode: PRVC FiO2 (%):  [40 %-50 %] 40 % Set Rate:  [24 bmp] 24 bmp Vt Set:  [410 mL-540 mL] 410 mL PEEP:  [10 cmH20] 10 cmH20 Plateau Pressure:  [10 cmH20-16 cmH20] 10 cmH20   PE: Gen:  Sedated on vent Card:  Tachycardic  Pulm:  Vent setting as above Abd: No obvious distention and soft.  No rigidity or guarding.  Prior midline scar well-healed.  Left inguinal hernia easily reducible.    Lab Results:  Recent Labs    03/03/21 0530 03/03/21 1542 03/04/21 0413 03/04/21 0421  WBC 13.1*  --  13.0*  --   HGB 12.0*   < > 10.8* 11.2*  HCT 38.7*   < > 33.0* 33.0*  PLT 272  --  192  --    < > = values in this interval not displayed.    BMET Recent Labs    03/03/21 0530 03/03/21 1542 03/04/21 0413 03/04/21 0421  NA 134*   < > 135 136  K 4.8   < > 3.9 3.9  CL 99  --  94*  --   CO2 21*  --  27  --   GLUCOSE 217*  --  148*  --   BUN 62*  --  67*  --   CREATININE 3.06*  --  3.01*  --   CALCIUM 7.0*  --  6.5*  --    < > = values in this interval not displayed.    PT/INR Recent Labs    03/02/21 0512  LABPROT 15.3*  INR 1.2    CMP      Component Value Date/Time   NA 136 03/04/2021 0421   K 3.9 03/04/2021 0421   CL 94 (L) 03/04/2021 0413   CO2 27 03/04/2021 0413   GLUCOSE 148 (H) 03/04/2021 0413   BUN 67 (H) 03/04/2021 0413   CREATININE 3.01 (H) 03/04/2021 0413   CALCIUM 6.5 (L) 03/04/2021 0413   PROT 5.2 (L) 03/04/2021 0413   ALBUMIN 1.9 (L) 03/04/2021 0413   AST 42 (H) 03/04/2021 0413   ALT 22 03/04/2021 0413   ALKPHOS 88 03/04/2021 0413   BILITOT 0.6 03/04/2021 0413   GFRNONAA 21 (L) 03/04/2021 0413   GFRAA >60 01/28/2018 0211   Lipase     Component Value Date/Time   LIPASE 32 01/27/2018 0004    Studies/Results: DG Chest Port 1 View  Result Date: 03/03/2021 CLINICAL DATA:  76 year old male with sepsis.  Intubated. EXAM: PORTABLE CHEST 1 VIEW COMPARISON:  Portable chest 03/02/2021 and earlier. FINDINGS: Portable AP semi upright view at 0447 hours. Endotracheal tube tip in good position between  the level the clavicles and carina. Enteric tube courses to the abdomen, tip not included. Stable right IJ central line. Since 0506 hours yesterday development of confluent biapical, left perihilar and lower lung opacity. Underlying chronic increased interstitial markings. No convincing pneumothorax. Possible pneumo mediastinum on the portable chest yesterday is less apparent, and there is no obvious chest wall or supraclavicular gas. Stable cardiac size and mediastinal contours. Right mid and lower lung appear to remain at baseline. No pleural effusion is evident. Stable visualized osseous structures. IMPRESSION: 1. Satisfactory lines and tubes. 2. Acute multifocal left greater than right Patchy and confluent pulmonary opacity since 0506 hours yesterday compatible with bilateral pneumonia. Underlying chronic lung disease. No definite pleural effusion. 3. No convincing pneumothorax or subcutaneous emphysema, and possible pneumomediastinum on the portable chest yesterday is less apparent. Electronically Signed   By: Genevie Ann M.D.    On: 03/03/2021 06:29   DG CHEST PORT 1 VIEW  Result Date: 03/02/2021 CLINICAL DATA:  Placement of right IJ central venous catheter EXAM: PORTABLE CHEST 1 VIEW COMPARISON:  Previous studies including the studies done earlier today FINDINGS: Cardiac size is within normal limits. Extensive patchy infiltrates are seen in both lungs with possible worsening of infiltrate in the left parahilar region and some improvement in aeration in the medial right lower lung fields. There is interval placement of central venous catheter through the right internal jugular with its tip in the superior vena cava. Tip of endotracheal tube is 3.7 cm above the carina. Enteric tube is noted traversing the esophagus. Tip of left PICC line is seen in the axilla. There is small linear area of lucency in the left margin of mediastinum at the level of aortic arch. There is possible minimal radiolucency in the left apex and along the lateral aspect of left upper lung fields. There is a catheter in the region of left IJ with its tip probably in the upper neck. IMPRESSION: Tip of right IJ central venous catheter is seen in the superior vena cava. There is no evidence of right pneumothorax. There are small faint radiolucencies in the left apex and along the lateral left upper lung fields. Possibility of small apical left pneumothorax is not excluded. There is radiolucency adjacent to the mediastinal margin on the left side at the level of aortic arch. This may suggest small loculated pneumothorax or pneumomediastinum. Short-term follow-up chest radiographs should be considered to evaluate these findings. Extensive patchy infiltrates are seen in the both lungs with interval worsening in the left parahilar region and possible improvement in the medial right lower lung fields. Findings suggest multifocal pneumonia. These results will be called to the ordering clinician or representative by the Radiologist Assistant, and communication documented in  the PACS or [employer]. Electronically Signed   By: Elmer Picker M.D.   On: 03/02/2021 17:45   DG CHEST PORT 1 VIEW  Result Date: 03/02/2021 CLINICAL DATA:  Placement of central venous catheter EXAM: PORTABLE CHEST 1 VIEW COMPARISON:  Previous study done earlier today FINDINGS: Cardiac size is within normal limits. Tip of endotracheal tube is proximally 2.6 cm above the carina. Enteric tube is noted traversing the esophagus with side port at the gastroesophageal junction. There is a catheter in the left lower neck, possibly left IJ catheter with its tip in more cephalad region of the neck or structure outside the patient's body. There is PICC line in the left axillary region. Exact location of the tip of the PICC line is not clearly  seen. There is interval worsening of patchy infiltrates in both lungs. There is minimal blunting of left lateral CP angle. There is no pneumothorax. IMPRESSION: There is interval worsening of patchy alveolar infiltrates in both lungs suggesting worsening of multifocal pneumonia. Possible minimal left pleural effusion. PICC line is seen in the left axilla. Tip of PICC line could not be clearly visualized. There is a catheter in the region of the left IJ in the lower neck. If it is a left IJ central venous catheter, it is coiled in the lower neck with its tip probably in more cephalad portion of the neck. Another possibility is that this is a structure outside the patient's body. Electronically Signed   By: Elmer Picker M.D.   On: 03/02/2021 15:35   ECHOCARDIOGRAM COMPLETE  Result Date: 03/03/2021    ECHOCARDIOGRAM REPORT   Patient Name:   SHERLEY LESER Date of Exam: 03/03/2021 Medical Rec #:  [id number]    Height:       68.0 in Accession #:    [id number]   Weight:       170.2 lb Date of Birth:  1945/07/09    BSA:          1.908 m Patient Age:    27 years     BP:           104/58 mmHg Patient Gender: M            HR:           102 bpm. Exam Location:  Inpatient  Procedure: 2D Echo, Cardiac Doppler and Color Doppler Indications:    Shock  History:        Patient has no prior history of Echocardiogram examinations.                 COPD and Stroke, Arrythmias:RBBB; Risk Factors:Hypertension.  Sonographer:    Luisa Hart RDCS Referring Phys: [id number] ELISE [place]  1. Abnormal septal motion . Left ventricular ejection fraction, by estimation, is 50 to 55%. The left ventricle has low normal function. The left ventricle has no regional wall motion abnormalities. Left ventricular diastolic parameters are consistent with Grade II diastolic dysfunction (pseudonormalization). Elevated left ventricular end-diastolic pressure.  2. Right ventricular systolic function is moderately reduced. The right ventricular size is moderately enlarged.  3. The mitral valve is abnormal. No evidence of mitral valve regurgitation. No evidence of mitral stenosis. Moderate mitral annular calcification.  4. The aortic valve is normal in structure. Aortic valve regurgitation is not visualized. No aortic stenosis is present.  5. The inferior vena cava is dilated in size with >50% respiratory variability, suggesting right atrial pressure of 8 mmHg. FINDINGS  Left Ventricle: Abnormal septal motion. Left ventricular ejection fraction, by estimation, is 50 to 55%. The left ventricle has low normal function. The left ventricle has no regional wall motion abnormalities. The left ventricular internal cavity size was normal in size. There is no left ventricular hypertrophy. Left ventricular diastolic parameters are consistent with Grade II diastolic dysfunction (pseudonormalization). Elevated left ventricular end-diastolic pressure. Right Ventricle: The right ventricular size is moderately enlarged. No increase in right ventricular wall thickness. Right ventricular systolic function is moderately reduced. Left Atrium: Left atrial size was normal in size. Right Atrium: Right atrial size was normal  in size. Pericardium: There is no evidence of pericardial effusion. Mitral Valve: The mitral valve is abnormal. There is moderate thickening of the mitral valve leaflet(s). There is moderate calcification of the mitral  valve leaflet(s). Moderate mitral annular calcification. No evidence of mitral valve regurgitation. No evidence of mitral valve stenosis. MV peak gradient, 3.2 mmHg. The mean mitral valve gradient is 1.0 mmHg. Tricuspid Valve: The tricuspid valve is normal in structure. Tricuspid valve regurgitation is not demonstrated. No evidence of tricuspid stenosis. Aortic Valve: The aortic valve is normal in structure. Aortic valve regurgitation is not visualized. No aortic stenosis is present. Aortic valve mean gradient measures 3.0 mmHg. Aortic valve peak gradient measures 5.9 mmHg. Aortic valve area, by VTI measures 1.65 cm. Pulmonic Valve: The pulmonic valve was normal in structure. Pulmonic valve regurgitation is not visualized. No evidence of pulmonic stenosis. Aorta: The aortic root is normal in size and structure. Venous: The inferior vena cava is dilated in size with greater than 50% respiratory variability, suggesting right atrial pressure of 8 mmHg. IAS/Shunts: No atrial level shunt detected by color flow Doppler.  LEFT VENTRICLE PLAX 2D LVIDd:         4.10 cm     Diastology LVIDs:         3.50 cm     LV e' medial:    5.06 cm/s LV PW:         0.80 cm     LV E/e' medial:  12.6 LV IVS:        0.60 cm     LV e' lateral:   3.87 cm/s LVOT diam:     1.80 cm     LV E/e' lateral: 16.5 LV SV:         27 LV SV Index:   14 LVOT Area:     2.54 cm  LV Volumes (MOD) LV vol d, MOD A2C: 44.4 ml LV vol d, MOD A4C: 47.3 ml LV vol s, MOD A2C: 17.3 ml LV vol s, MOD A4C: 24.6 ml LV SV MOD A2C:     27.1 ml LV SV MOD A4C:     47.3 ml LV SV MOD BP:      26.6 ml RIGHT VENTRICLE RV Basal diam:  2.80 cm RV Mid diam:    1.90 cm RV S prime:     11.80 cm/s TAPSE (M-mode): 1.6 cm LEFT ATRIUM           Index       RIGHT ATRIUM            Index LA Vol (A4C): 16.3 ml 8.54 ml/m  RA Area:     13.50 cm                                   RA Volume:   27.40 ml  14.36 ml/m  AORTIC VALVE AV Area (Vmax):    1.68 cm AV Area (Vmean):   1.63 cm AV Area (VTI):     1.65 cm AV Vmax:           121.00 cm/s AV Vmean:          80.800 cm/s AV VTI:            0.162 m AV Peak Grad:      5.9 mmHg AV Mean Grad:      3.0 mmHg LVOT Vmax:         80.00 cm/s LVOT Vmean:        51.900 cm/s LVOT VTI:          0.105 m LVOT/AV VTI ratio: 0.65 MITRAL VALVE MV Area (  PHT): 3.85 cm    SHUNTS MV Area VTI:   1.19 cm    Systemic VTI:  0.10 m MV Peak grad:  3.2 mmHg    Systemic Diam: 1.80 cm MV Mean grad:  1.0 mmHg MV Vmax:       0.90 m/s MV Vmean:      56.1 cm/s MV Decel Time: 197 msec MV E velocity: 64.00 cm/s MV A velocity: 52.90 cm/s MV E/A ratio:  1.21 Jenkins Rouge MD Electronically signed by Jenkins Rouge MD Signature Date/Time: 03/03/2021/9:20:26 AM    Final     Anti-infectives: Anti-infectives (From admission, onward)    Start     Dose/Rate Route Frequency Ordered Stop   03/02/21 1600  piperacillin-tazobactam (ZOSYN) IVPB 2.25 g        2.25 g 100 mL/hr over 30 Minutes Intravenous Every 8 hours 03/02/21 0720     03/02/21 0730  piperacillin-tazobactam (ZOSYN) IVPB 3.375 g        3.375 g 100 mL/hr over 30 Minutes Intravenous STAT 03/02/21 0720 03/02/21 0809   03/02/21 0715  piperacillin-tazobactam (ZOSYN) IVPB 3.375 g  Status:  Discontinued        3.375 g 12.5 mL/hr over 240 Minutes Intravenous STAT 03/02/21 0711 03/02/21 0719   03/02/21 0515  ceFEPIme (MAXIPIME) 2 g in sodium chloride 0.9 % 100 mL IVPB        2 g 200 mL/hr over 30 Minutes Intravenous  Once 03/02/21 0502 03/02/21 0658   03/02/21 0515  metroNIDAZOLE (FLAGYL) IVPB 500 mg  Status:  Discontinued        500 mg 100 mL/hr over 60 Minutes Intravenous  Once 03/02/21 0502 03/02/21 0702   03/02/21 0515  vancomycin (VANCOCIN) IVPB 1000 mg/200 mL premix  Status:  Discontinued        1,000 mg 200  mL/hr over 60 Minutes Intravenous  Once 03/02/21 0502 03/02/21 0510   03/02/21 0515  vancomycin (VANCOREADY) IVPB 1500 mg/300 mL        1,500 mg 150 mL/hr over 120 Minutes Intravenous  Once 03/02/21 0510 03/02/21 1033        Assessment/Plan Incarcerated left inguinal hernia that is currently reduced. Possible pneumatosis in the herniated bowel loop on CT - No major changes today - continue to observe abdominal exam  Patient is currently with acute respiratory failure on the vent and shock requiring pressure support likely secondary to multifocal pneumonia.  His labs are improving.  Patient is high risk for any operative intervention. Continue with conservative management at this time and continued resuscitation by CCM.  He is already on antibiotics for his pneumonia. Cont NGT to LIWS. We will continue to follow along closely.  FEN - NPO, NGT, IVF per TRH VTE - SCDs, okay for chemical prophylaxis from our standpoint ID - Zosyn Foley - In place   Shock - per ccm, improving Acute resp failure - On vent, per CCM Multifocal PNA - on abx. Per CCM Acute urinary retention - s/p foley placement by urology 3/2. Ucx pending HTN COPD Hx CVA Hx bladder CA  This care required high  level of medical decision making.    LOS: 2 days   Felicie Morn, [facility] 03/04/2021, 9:32 AM Please see Amion for pager number during day hours 7:00am-4:30pm

## 2021-03-05 DIAGNOSIS — J80 Acute respiratory distress syndrome: Secondary | ICD-10-CM

## 2021-03-05 DIAGNOSIS — U071 COVID-19: Secondary | ICD-10-CM | POA: Diagnosis not present

## 2021-03-05 DIAGNOSIS — E440 Moderate protein-calorie malnutrition: Secondary | ICD-10-CM | POA: Diagnosis not present

## 2021-03-05 DIAGNOSIS — A419 Sepsis, unspecified organism: Secondary | ICD-10-CM | POA: Diagnosis not present

## 2021-03-05 DIAGNOSIS — K56609 Unspecified intestinal obstruction, unspecified as to partial versus complete obstruction: Secondary | ICD-10-CM | POA: Diagnosis not present

## 2021-03-05 LAB — CBC
HCT: 33.1 % — ABNORMAL LOW (ref 39.0–52.0)
Hemoglobin: 10.8 g/dL — ABNORMAL LOW (ref 13.0–17.0)
MCH: 22.4 pg — ABNORMAL LOW (ref 26.0–34.0)
MCHC: 32.6 g/dL (ref 30.0–36.0)
MCV: 68.7 fL — ABNORMAL LOW (ref 80.0–100.0)
Platelets: 191 10*3/uL (ref 150–400)
RBC: 4.82 MIL/uL (ref 4.22–5.81)
RDW: 14.6 % (ref 11.5–15.5)
WBC: 14.6 10*3/uL — ABNORMAL HIGH (ref 4.0–10.5)
nRBC: 0 % (ref 0.0–0.2)

## 2021-03-05 LAB — CALCIUM, IONIZED: Calcium, Ionized, Serum: 3.8 mg/dL — ABNORMAL LOW (ref 4.5–5.6)

## 2021-03-05 LAB — COMPREHENSIVE METABOLIC PANEL
ALT: 22 U/L (ref 0–44)
AST: 35 U/L (ref 15–41)
Albumin: 2 g/dL — ABNORMAL LOW (ref 3.5–5.0)
Alkaline Phosphatase: 115 U/L (ref 38–126)
Anion gap: 13 (ref 5–15)
BUN: 67 mg/dL — ABNORMAL HIGH (ref 8–23)
CO2: 31 mmol/L (ref 22–32)
Calcium: 7.1 mg/dL — ABNORMAL LOW (ref 8.9–10.3)
Chloride: 97 mmol/L — ABNORMAL LOW (ref 98–111)
Creatinine, Ser: 2.65 mg/dL — ABNORMAL HIGH (ref 0.61–1.24)
GFR, Estimated: 24 mL/min — ABNORMAL LOW (ref >60)
Glucose, Bld: 141 mg/dL — ABNORMAL HIGH (ref 70–99)
Potassium: 3.6 mmol/L (ref 3.5–5.1)
Sodium: 141 mmol/L (ref 135–145)
Total Bilirubin: 0.6 mg/dL (ref 0.3–1.2)
Total Protein: 5.8 g/dL — ABNORMAL LOW (ref 6.5–8.1)

## 2021-03-05 LAB — URINE CULTURE
Culture: 100 — AB
Special Requests: NORMAL

## 2021-03-05 LAB — GLUCOSE, CAPILLARY
Glucose-Capillary: 136 mg/dL — ABNORMAL HIGH (ref 70–99)
Glucose-Capillary: 148 mg/dL — ABNORMAL HIGH (ref 70–99)
Glucose-Capillary: 124 mg/dL — ABNORMAL HIGH (ref 70–99)
Glucose-Capillary: 125 mg/dL — ABNORMAL HIGH (ref 70–99)

## 2021-03-05 LAB — MAGNESIUM: Magnesium: 3.1 mg/dL — ABNORMAL HIGH (ref 1.7–2.4)

## 2021-03-05 MED ORDER — CALCIUM GLUCONATE-NACL 1-0.675 GM/50ML-% IV SOLN
1.0000 g | Freq: Once | INTRAVENOUS | Status: AC
  Administered 2021-03-05: 1000 mg via INTRAVENOUS
  Filled 2021-03-05: qty 50

## 2021-03-05 MED ORDER — SODIUM CHLORIDE 0.9 % IV SOLN
500.0000 mg | Freq: Two times a day (BID) | INTRAVENOUS | Status: DC
  Administered 2021-03-05 (×2): 500 mg via INTRAVENOUS
  Filled 2021-03-05 (×4): qty 10

## 2021-03-05 MED ORDER — PIPERACILLIN-TAZOBACTAM 3.375 G IVPB
3.3750 g | Freq: Three times a day (TID) | INTRAVENOUS | Status: DC
Start: 2021-03-05 — End: 2021-03-05
  Filled 2021-03-05: qty 50

## 2021-03-05 NOTE — Progress Notes (Signed)
? ?NAMECHRISTIAN [name], MRN:  [id number], DOB:  07-22-1945, LOS: 3 ?ADMISSION DATE:  03/02/2021, CONSULTATION DATE:  03/05/2021 ?REFERRING MD:  [name], MD CHIEF COMPLAINT:  SOB  ? ?History of Present Illness:  ?Pt is a 76 yo m w/ PMhx of dementia, bladder cancer presenting from SNF for SOB and emesis since early Tuesday AM. On arrival he was found to be somnolent w/ rhonchus breathing, sats in low 70s. Immediately intubated by ED MD and found feculent emesis throughout oropharynx and airway. Upon OGT placement had ~ 1L of immediate output. Per reports pt had associated tachypnea, fever and emesis since early Tuesday AM as well. No family present.  ? ?CT ab/pelvis on my review concerning for pneumatosis & SBO involving left inguinal & scrotum. Final read pending. ED MD was able to reduce palpable anterior abdominal wall portion however scrotum remains full. ED nursing unable to pass foley despite multiple attempts.  ? ?Pertinent  Medical History  ?Dementia  ?? Hx of Bladder cancer  ? ?Significant Hospital Events: ?Including procedures, antibiotic start and stop dates in addition to other pertinent events   ?3/2: admission for septic shock; central line and a-line placed ?3/3: decreased pressor and vent requirement. Echo with normal EF; G2DD. Moderate RV enlargement; P/F 153 ?3/4: off levo; on vaso alone. P/F 186 ?Interim History / Subjective:  ? ?Off pressors ?Weaning vent ? ?Cr improving  ? ? ?Objective   ?Blood pressure 121/72, pulse 82, temperature 97.8 ?F (36.6 ?C), temperature source Oral, resp. rate 19, height '5\' 8"'$  (1.727 m), weight 77.6 kg, SpO2 96 %. ?   ?Vent Mode: PSV;CPAP ?FiO2 (%):  [40 %] 40 % ?Set Rate:  [24 bmp] 24 bmp ?Vt Set:  [410 mL] 410 mL ?PEEP:  [8 cmH20] 8 cmH20 ?Pressure Support:  [8 cmH20] 8 cmH20  ? ?Intake/Output Summary (Last 24 hours) at 03/05/2021 0909 ?Last data filed at 03/05/2021 0800 ?Gross per 24 hour  ?Intake 697.73 ml  ?Output 2620 ml  ?Net -1922.27 ml  ? ?Filed Weights  ? 03/03/21  0600 03/04/21 0500 03/05/21 0500  ?Weight: 77.2 kg 79.1 kg 77.6 kg  ? ? ?Examination: ?General: Critically ill apeparing elderly M intubated lightly sedated NAD ?HENT: NCAT Pink mm ETT secure  ?Lungs: even unlabored on PSV. Coarse  ?Cardiovascular: rrr s1s2 cap refill brisk  ?Abdomen: soft abdomen. Inguinal hernia, reduced. Lower midline incision  ?Extremities: no acute joint deformity no cyanosis or clubbing  ?Neuro: eyes open spontaneously. Withdraws to pain  ?GU: foley. Edematous scrotum  ? ?Resolved Hospital Problem list   ? ?Assessment & Plan:  ? ?Acute respiratory failure with hypoxia, ARDS  ?Aspiration PNA ?P ?-cont SBT efforts -- may go to OR early this week so will plan to remain intubated until clarified  ?-VAP, pulm hygiene ? ?Septic shock due to aspiration PNA -- improved shock  ?P ?-cont zosyn  ? ?SBO with incarcerated hernia ?Concerning for ischemia/pneumatosis.  ?Hernia reducible, reduced in ED   ?P ?-CCS following -- with clinical improvements, possible OR candidate this coming week  ?-strict NPO  ? ?AKI due to septic shock, hypovolemia, obstruction due to urethral stricture -- improving  ?Urethral stricture  ?P ?-stricture dilation and foley per uro ?-trend reanl indices, cont IVF ?-trend renal indices UOP  ? ?Afib RVR -- now SNR  ?P ?-cont amio  ?-K goal 4 Mag goal 2  ? ?GERD ?-PPI ? ?Dementia  ?- Ambulates, AAOx3. Needs ADL assistance and minimally communicative   ? ?  Moderate malnutrition ?P ?-remains NPO  ?-no indication for TPN right now  ?  ? ?Best Practice (right click and "Reselect all SmartList Selections" daily)  ? ?Diet/type: NPO ?DVT prophylaxis: LMWH ?GI prophylaxis: PPI ?Lines: N/A ?Foley:  Yes, and it is still needed ?Code Status:  full code ?Family updated 3/3. ? ?CRITICAL CARE ?Performed by: Cristal Generous ? ?Total critical care time: 41 minutes ? ?Critical care time was exclusive of separately billable procedures and treating other patients. ?Critical care was necessary to treat or  prevent imminent or life-threatening deterioration. ? ?Critical care was time spent personally by me on the following activities: development of treatment plan with patient and/or surrogate as well as nursing, discussions with consultants, evaluation of patient's response to treatment, examination of patient, obtaining history from patient or surrogate, ordering and performing treatments and interventions, ordering and review of laboratory studies, ordering and review of radiographic studies, pulse oximetry and re-evaluation of patient's condition. ? ?Eliseo Gum MSN, AGACNP-BC ?[facility] ?Amion for pager  ?03/05/2021, 9:09 AM ? ? ? ? ?

## 2021-03-05 NOTE — Progress Notes (Signed)
Pharmacy Antibiotic Note ? ?Jason [name] is a 76 y.o. male admitted on 03/02/2021 with UTI.  Pharmacy has been consulted for Merrem dosing. CrCl - 23 ml/min ? ?ESBL E. Coli in the urine ? ?Plan: ?D/c Zosyn ?Start Merrem 500 mg IV q12hr ?Monitor renal function, clinical status and length of therapy ? ?Height: '5\' 8"'$  (172.7 cm) ?Weight: 77.6 kg (171 lb 1.2 oz) ?IBW/kg (Calculated) : 68.4 ? ?Temp (24hrs), Avg:98.5 ?F (36.9 ?C), Min:97.8 ?F (36.6 ?C), Max:99.4 ?F (37.4 ?C) ? ?Recent Labs  ?Lab 03/02/21 ?1610 03/02/21 ?9604 03/02/21 ?5409 03/02/21 ?1437 03/02/21 ?1558 03/03/21 ?0530 03/04/21 ?0413 03/05/21 ?0500  ?WBC 29.2*  --   --   --  7.0 13.1* 13.0* 14.6*  ?CREATININE 3.34* 3.30*  --   --  3.36* 3.06* 3.01* 2.65*  ?LATICACIDVEN 8.6*  --  6.5* 5.2*  --   --   --   --   ?  ?Estimated Creatinine Clearance: 22.6 mL/min (A) (by C-G formula based on SCr of 2.65 mg/dL (H)).   ? ?No Known Allergies ? ?Antimicrobials this admission: ?Zosyn 3/2 >> 3/5 ?Merrem 3/5 >>  ? ?Thank you for allowing pharmacy to be a part of this patient?s care. ? ?Alanda Slim, PharmD, FCCM ?Clinical Pharmacist ?Please see AMION for all Pharmacists' Contact Phone Numbers ?03/05/2021, 12:09 PM  ? ? ?

## 2021-03-05 NOTE — Progress Notes (Signed)
PHARMACY NOTE:  ANTIMICROBIAL RENAL DOSAGE ADJUSTMENT ? ?Current antimicrobial regimen includes a mismatch between antimicrobial dosage and estimated renal function.  As per policy approved by the Pharmacy & Therapeutics and Medical Executive Committees, the antimicrobial dosage will be adjusted accordingly. ? ?Current antimicrobial dosage:  Zosyn 2.25 gm IV q8hr ? ?Indication: Intra-abdominal infection ? ?Renal Function: Has improved ? ?Estimated Creatinine Clearance: 22.6 mL/min (A) (by C-G formula based on SCr of 2.65 mg/dL (H)). ? ?Antimicrobial dosage has been changed to:  Zosyn 3.375 gm IV q8hr (EI) ? ?Thank you for allowing pharmacy to be a part of this patient's care. ? ?Alanda Slim, PharmD, FCCM ?Clinical Pharmacist ?Please see AMION for all Pharmacists' Contact Phone Numbers ?03/05/2021, 8:56 AM  ? ?

## 2021-03-05 NOTE — Progress Notes (Signed)
Patient ID: Jason [name], male   DOB: [birth year], 76 y.o.   MRN: [id number] ? ? ?Acute Care Surgery Service Progress Note:   ? ?Chief Complaint/Subjective: ?Off pressors ?Cr improving ?Vent requirement less ?Decreasing NG output; no BM ? ?Objective: ?Vital signs in last 24 hours: ?Temp:  [98.2 ?F (36.8 ?C)-100.3 ?F (37.9 ?C)] 98.4 ?F (36.9 ?C) (03/05 0330) ?Pulse Rate:  [81-106] 81 (03/05 0600) ?Resp:  [13-35] 23 (03/05 0600) ?BP: (103-138)/(60-72) 116/69 (03/05 0500) ?SpO2:  [89 %-100 %] 96 % (03/05 0600) ?Arterial Line BP: (104-142)/(48-70) 119/56 (03/05 0600) ?FiO2 (%):  [40 %] 40 % (03/05 0322) ?Weight:  [77.6 kg] 77.6 kg (03/05 0500) ?  ? ?Intake/Output from previous day: ?03/04 0701 - 03/05 0700 ?In: 699.2 [I.V.:489.2; NG/GT:60; IV Piggyback:150] ?Out: 2775 [Urine:2625; Emesis/NG output:150] ?Intake/Output this shift: ?No intake/output data recorded. ? ?Lungs: inutabed, sedated, nonlabored ? ?Cardiovascular: reg ? ?Abd: soft, lower midline incision; LIH - out but is reducible ? ?Extremities: no edema, +SCDs ? ?Neuro: intubated,sedated ? ?Lab Results: ?CBC  ?Recent Labs  ?  03/04/21 ?0413 03/04/21 ?0421 03/04/21 ?1058 03/05/21 ?0500  ?WBC 13.0*  --   --  14.6*  ?HGB 10.8*   < > 11.2* 10.8*  ?HCT 33.0*   < > 33.0* 33.1*  ?PLT 192  --   --  191  ? < > = values in this interval not displayed.  ? ?BMET ?Recent Labs  ?  03/04/21 ?0413 03/04/21 ?0421 03/04/21 ?1058 03/05/21 ?0500  ?NA 135   < > 136 141  ?K 3.9   < > 3.8 3.6  ?CL 94*  --   --  97*  ?CO2 27  --   --  31  ?GLUCOSE 148*  --   --  141*  ?BUN 67*  --   --  67*  ?CREATININE 3.01*  --   --  2.65*  ?CALCIUM 6.5*  --   --  7.1*  ? < > = values in this interval not displayed.  ? ?LFT ?Hepatic Function Latest Ref Rng & Units 03/05/2021 03/04/2021 03/02/2021  ?Total Protein 6.5 - 8.1 g/dL 5.8(L) 5.2(L) 5.4(L)  ?Albumin 3.5 - 5.0 g/dL 2.0(L) 1.9(L) 2.3(L)  ?AST 15 - 41 U/L 35 42(H) 39  ?ALT 0 - 44 U/L _0 ?Alk Phosphatase 38 - 126 U/L 115 88 67  ?Total Bilirubin 0.3  - 1.2 mg/dL 0.6 0.6 1.4(H)  ?Bilirubin, Direct 0.0 - 0.3 mg/dL - - -  ? ?PT/INR ?No results for input(s): LABPROT, INR in the last 72 hours. ?ABG ?Recent Labs  ?  03/04/21 ?0421 03/04/21 ?1058  ?PHART 7.442 7.484*  ?HCO3 31.6* 31.9*  ? ? ?Studies/Results: ? ?Anti-infectives: ?Anti-infectives (From admission, onward)  ? ? Start     Dose/Rate Route Frequency Ordered Stop  ? 03/02/21 1600  piperacillin-tazobactam (ZOSYN) IVPB 2.25 g       ? 2.25 g ?100 mL/hr over 30 Minutes Intravenous Every 8 hours 03/02/21 0720    ? 03/02/21 0730  piperacillin-tazobactam (ZOSYN) IVPB 3.375 g       ? 3.375 g ?100 mL/hr over 30 Minutes Intravenous STAT 03/02/21 0720 03/02/21 0809  ? 03/02/21 0715  piperacillin-tazobactam (ZOSYN) IVPB 3.375 g  Status:  Discontinued       ? 3.375 g ?12.5 mL/hr over 240 Minutes Intravenous STAT 03/02/21 0711 03/02/21 0719  ? 03/02/21 0515  ceFEPIme (MAXIPIME) 2 g in sodium chloride 0.9 % 100 mL IVPB       ?  2 g ?200 mL/hr over 30 Minutes Intravenous  Once 03/02/21 0502 03/02/21 0658  ? 03/02/21 0515  metroNIDAZOLE (FLAGYL) IVPB 500 mg  Status:  Discontinued       ? 500 mg ?100 mL/hr over 60 Minutes Intravenous  Once 03/02/21 0502 03/02/21 0702  ? 03/02/21 0515  vancomycin (VANCOCIN) IVPB 1000 mg/200 mL premix  Status:  Discontinued       ? 1,000 mg ?200 mL/hr over 60 Minutes Intravenous  Once 03/02/21 0502 03/02/21 0510  ? 03/02/21 0515  vancomycin (VANCOREADY) IVPB 1500 mg/300 mL       ? 1,500 mg ?150 mL/hr over 120 Minutes Intravenous  Once 03/02/21 0510 03/02/21 1033  ? ?  ? ? ?Medications: ?Scheduled Meds: ? chlorhexidine gluconate (MEDLINE KIT)  15 mL Mouth Rinse BID  ? Chlorhexidine Gluconate Cloth  6 each Topical Daily  ? heparin  5,000 Units Subcutaneous Q8H  ? insulin aspart  0-9 Units Subcutaneous Q4H  ? mouth rinse  15 mL Mouth Rinse 10 times per day  ? pantoprazole (PROTONIX) IV  40 mg Intravenous QHS  ? sodium chloride flush  10-40 mL Intracatheter Q12H  ? ?Continuous Infusions: ? sodium  chloride Stopped (03/03/21 0848)  ? amiodarone 30 mg/hr (03/05/21 0600)  ? fentaNYL infusion INTRAVENOUS 50 mcg/hr (03/05/21 0600)  ? midazolam Stopped (03/03/21 0405)  ? norepinephrine (LEVOPHED) Adult infusion Stopped (03/03/21 1700)  ? piperacillin-tazobactam (ZOSYN)  IV Stopped (03/05/21 0157)  ? vasopressin Stopped (03/04/21 0950)  ? ?PRN Meds:.acetaminophen, fentaNYL, midazolam, sodium chloride flush ? ?Assessment/Plan: ?Patient Active Problem List  ? Diagnosis Date Noted  ? Malnutrition of moderate degree 03/03/2021  ? Incarcerated hernia   ? Pressure injury of skin 01/30/2018  ? Small bowel obstruction ([place]) 01/27/2018  ? Inguinal hernia 01/27/2018  ? Acute esophagitis 01/27/2018  ? Hematemesis 01/27/2018  ? Pneumonia 12/15/2015  ? Acute encephalopathy 03/07/2013  ? Postoperative intra-abdominal abscess 03/07/2013  ? Hydronephrosis, bilateral 03/07/2013  ? Pressure ulcer, stage DXI(338.25) 03/07/2013  ? UTI (lower urinary tract infection) 02/18/2013  ? Septic shock ([place]) 02/18/2013  ? Aspiration pneumonia ([place]) 02/18/2013  ? Acute respiratory failure with hypoxia ([place]) 02/18/2013  ? Acute renal failure ([place]) 02/18/2013  ? Dementia ([place]) 02/18/2013  ? Hematuria 02/18/2013  ? 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 Memory loss 03/27/2009  ? CELLULITIS, HAND, LEFT 03/23/2009  ? FATIGUE 03/15/2009  ? ALCOHOL ABUSE 01/30/2008  ? GOITER 10/25/2006  ? 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 HYPERTENSION 10/25/2006  ? COLONIC POLYPS, HX OF 10/25/2006  ? BENIGN PROSTATIC HYPERTROPHY, HX OF 10/25/2006  ? ?Incarcerated left inguinal hernia that is reducible. ?Possible pneumatosis in the herniated bowel loop on CT ?- he is improving from a shock and resp standpoint- continue to observe abdominal exam ?  ?  His labs are improving.  Patient is mod to high risk for any operative intervention. Continue with conservative management at this time and continued resuscitation by CCM.  He is already on antibiotics for his pneumonia.  Cont NGT to LIWS. We will continue to follow along closely. ?  ?FEN - NPO, NGT, IVF per TRH ?VTE - SCDs, okay for chemical prophylaxis from our standpoint ?ID - Zosyn ?Foley - In place  ?  ?Shock - resolved ?Acute resp failure - On vent, per CCM ?Multifocal PNA - on abx. Per CCM ?Acute urinary retention - s/p foley placement by urology 3/2. Ucx pending ?HTN ?COPD ?Hx CVA ?Hx bladder CA ?Disposition: since he is slowly improving, possible may  be a candidate for operative repair of LIH during this admission.  ? ?Moderate level of MDM - review of nursing and MD notes past 24 hrs, review of labs, discussion of pt with CCM APP ? LOS: 3 days  ? ? ?Leighton Ruff. Redmond Pulling, MD, FACS ?General, Bariatric, & Minimally Invasive Surgery ?[phone] ?[facility] ? ?

## 2021-03-05 NOTE — Plan of Care (Signed)
?  Problem: Clinical Measurements: ?Goal: Diagnostic test results will improve ?Outcome: Progressing ?Goal: Respiratory complications will improve ?Outcome: Progressing ?  ?Problem: Elimination: ?Goal: Will not experience complications related to urinary retention ?Outcome: Progressing ?  ?Problem: Safety: ?Goal: Ability to remain free from injury will improve ?Outcome: Progressing ?  ?Problem: Skin Integrity: ?Goal: Risk for impaired skin integrity will decrease ?Outcome: Progressing ?  ?Problem: Activity: ?Goal: Risk for activity intolerance will decrease ?Outcome: Not Progressing ?Note: Unable to mobilize due to critical illness ?  ?Problem: Nutrition: ?Goal: Adequate nutrition will be maintained ?Outcome: Not Progressing ?Note: Unable to start tube feeds due to illness. ?  ?Problem: Elimination: ?Goal: Will not experience complications related to bowel motility ?Outcome: Not Progressing ?Note: Pt has not had a bowel movement since admission. MD aware ?  ?

## 2021-03-06 ENCOUNTER — Inpatient Hospital Stay (HOSPITAL_COMMUNITY): Payer: Medicare (Managed Care)

## 2021-03-06 DIAGNOSIS — K56609 Unspecified intestinal obstruction, unspecified as to partial versus complete obstruction: Secondary | ICD-10-CM | POA: Diagnosis not present

## 2021-03-06 DIAGNOSIS — A419 Sepsis, unspecified organism: Secondary | ICD-10-CM | POA: Diagnosis not present

## 2021-03-06 DIAGNOSIS — J690 Pneumonitis due to inhalation of food and vomit: Secondary | ICD-10-CM

## 2021-03-06 DIAGNOSIS — N179 Acute kidney failure, unspecified: Secondary | ICD-10-CM | POA: Diagnosis not present

## 2021-03-06 LAB — COMPREHENSIVE METABOLIC PANEL
ALT: 29 U/L (ref 0–44)
AST: 62 U/L — ABNORMAL HIGH (ref 15–41)
Albumin: 2 g/dL — ABNORMAL LOW (ref 3.5–5.0)
Alkaline Phosphatase: 147 U/L — ABNORMAL HIGH (ref 38–126)
Anion gap: 10 (ref 5–15)
BUN: 60 mg/dL — ABNORMAL HIGH (ref 8–23)
CO2: 31 mmol/L (ref 22–32)
Calcium: 7.3 mg/dL — ABNORMAL LOW (ref 8.9–10.3)
Chloride: 103 mmol/L (ref 98–111)
Creatinine, Ser: 2.14 mg/dL — ABNORMAL HIGH (ref 0.61–1.24)
GFR, Estimated: 31 mL/min — ABNORMAL LOW (ref >60)
Glucose, Bld: 120 mg/dL — ABNORMAL HIGH (ref 70–99)
Potassium: 3.7 mmol/L (ref 3.5–5.1)
Sodium: 144 mmol/L (ref 135–145)
Total Bilirubin: 0.9 mg/dL (ref 0.3–1.2)
Total Protein: 5.9 g/dL — ABNORMAL LOW (ref 6.5–8.1)

## 2021-03-06 LAB — CBC
HCT: 33.3 % — ABNORMAL LOW (ref 39.0–52.0)
Hemoglobin: 10.8 g/dL — ABNORMAL LOW (ref 13.0–17.0)
MCH: 22.6 pg — ABNORMAL LOW (ref 26.0–34.0)
MCHC: 32.4 g/dL (ref 30.0–36.0)
MCV: 69.8 fL — ABNORMAL LOW (ref 80.0–100.0)
Platelets: 188 10*3/uL (ref 150–400)
RBC: 4.77 MIL/uL (ref 4.22–5.81)
RDW: 15.1 % (ref 11.5–15.5)
WBC: 11.5 10*3/uL — ABNORMAL HIGH (ref 4.0–10.5)
nRBC: 0.2 % (ref 0.0–0.2)

## 2021-03-06 LAB — SURGICAL PCR SCREEN
MRSA, PCR: NEGATIVE
Staphylococcus aureus: NEGATIVE

## 2021-03-06 LAB — GLUCOSE, CAPILLARY
Glucose-Capillary: 105 mg/dL — ABNORMAL HIGH (ref 70–99)
Glucose-Capillary: 108 mg/dL — ABNORMAL HIGH (ref 70–99)
Glucose-Capillary: 110 mg/dL — ABNORMAL HIGH (ref 70–99)
Glucose-Capillary: 112 mg/dL — ABNORMAL HIGH (ref 70–99)
Glucose-Capillary: 117 mg/dL — ABNORMAL HIGH (ref 70–99)
Glucose-Capillary: 123 mg/dL — ABNORMAL HIGH (ref 70–99)

## 2021-03-06 LAB — MAGNESIUM: Magnesium: 2.9 mg/dL — ABNORMAL HIGH (ref 1.7–2.4)

## 2021-03-06 MED ORDER — SODIUM CHLORIDE 0.9 % IV SOLN
1.0000 g | Freq: Two times a day (BID) | INTRAVENOUS | Status: DC
  Administered 2021-03-06 – 2021-03-11 (×11): 1 g via INTRAVENOUS
  Filled 2021-03-06 (×11): qty 20

## 2021-03-06 NOTE — Progress Notes (Addendum)
? ?NAMEMARVIE [name], MRN:  [id number], DOB:  [birth year], LOS: 4 ?ADMISSION DATE:  03/02/2021, CONSULTATION DATE:  03/06/2021 ?REFERRING MD:  [name], MD CHIEF COMPLAINT:  SOB  ? ?History of Present Illness:  ?Pt is a 76 yo m w/ PMhx of dementia, bladder cancer presenting from SNF for SOB and emesis since early Tuesday AM. On arrival he was found to be somnolent w/ rhonchus breathing, sats in low 70s. Immediately intubated by ED MD and found feculent emesis throughout oropharynx and airway. Upon OGT placement had ~ 1L of immediate output. Per reports pt had associated tachypnea, fever and emesis since early Tuesday AM as well. No family present.  ? ?CT ab/pelvis on my review concerning for pneumatosis & SBO involving left inguinal & scrotum. Final read pending. ED MD was able to reduce palpable anterior abdominal wall portion however scrotum remains full. ED nursing unable to pass foley despite multiple attempts.  ? ?Pertinent  Medical History  ?Dementia  ?? Hx of Bladder cancer  ? ?Significant Hospital Events: ?Including procedures, antibiotic start and stop dates in addition to other pertinent events   ?3/2: admission for septic shock; central line and a-line placed ?3/3: decreased pressor and vent requirement. Echo with normal EF; G2DD. Moderate RV enlargement; P/F 153 ?3/4: off levo; on vaso alone. P/F 186 ?Interim History / Subjective:  ? ?No significant overnight events.  ?Remains on full vent support. Off pressors.  ? ?24h UOP 2L.  ? ?Labs reviewed: WBC continues to improve.  ?Creatinine continues to improve 3>2.6>2.1. Alk phos 1.47.  ? ?UC: 100k colonies of e.coli ESBL ? ?Objective   ?Blood pressure 122/68, pulse 84, temperature 99.3 ?F (37.4 ?C), resp. rate 19, height 5' 8" (1.727 m), weight 76.7 kg, SpO2 99 %. ?   ?Vent Mode: PSV;CPAP ?FiO2 (%):  [40 %] 40 % ?Set Rate:  [24 bmp] 24 bmp ?Vt Set:  [410 mL] 410 mL ?PEEP:  [8 cmH20] 8 cmH20 ?Pressure Support:  [8 cmH20] 8 cmH20  ? ?Intake/Output Summary  (Last 24 hours) at 03/06/2021 0815 ?Last data filed at 03/06/2021 0700 ?Gross per 24 hour  ?Intake 931.83 ml  ?Output 1890 ml  ?Net -958.17 ml  ? ? ?Filed Weights  ? 03/04/21 0500 03/05/21 0500 03/06/21 0354  ?Weight: 79.1 kg 77.6 kg 76.7 kg  ? ? ?Examination: ?General: critically ill appearing, intubated ?Cardiac: RRR, no LE edema ?Pulm: on full vent support. Mechanical breath sounds ?GI: hypoactive bowel sounds. Abdomen minimally distended.  ?Skin: no rash or lesion ?Neuro: opens eyes to voice. Does not follow commands.  ? ?Resolved Hospital Problem list   ? ?Assessment & Plan:  ? ?Acute respiratory failure with hypoxia, ARDS secondary to aspiration pneumonia ?-can continue SBTs however will plan to keep intubated through tomorrow in anticipation of surgery  ? ?Septic shock due to aspiration PNA vs e.coli ESBL UTI ?Shock physiology resolved. Off pressors.  ?No growth on 3/2 blood cultures ?-cont Merrem  ? ?SBO with incarcerated left inguinal hernia ?-plan for surgical management 3/7 ?-NPO ? ?AKI due to septic shock, hypovolemia ?Urethral stricture s/p foley placement by urology ?-Renal function continuing to improve. Good UOP. Continue to monitor.  ? ?Afib RVR -- now SNR  ?CHADS2VASC 4 ?-cont amio  ?-hold anticoagulation in anticipation of surgery in AM ? ?GERD ?-PPI ? ?Dementia  ?- Ambulates, AAOx3. Needs ADL assistance and minimally communicative   ? ?Moderate malnutrition ?-remains NPO  ?-no indication for TPN right now  ? ?Best Practice (right  click and "Reselect all SmartList Selections" daily)  ? ?Diet/type: NPO ?DVT prophylaxis: LMWH ?GI prophylaxis: PPI ?Lines: N/A ?Foley:  Yes, and it is still needed ?Code Status:  full code ?Family updated 3/3. ? ?[name], MD ?Internal Medicine Resident PGY-3 ?[name] ?Pager: [phone] ?03/06/2021 8:34 AM  ?  ?

## 2021-03-06 NOTE — Progress Notes (Addendum)
? ?Subjective/Chief Complaint: ?Off pressors, arousable ? ? ?Objective: ?Vital signs in last 24 hours: ?Temp:  [97.8 ?F (36.6 ?C)-99.2 ?F (37.3 ?C)] 99.2 ?F (37.3 ?C) 03-10-22 0350) ?Pulse Rate:  [76-103] 84 03-10-22 0730) ?Resp:  [10-26] 19 03/10/22 0730) ?BP: (108-137)/(63-75) 122/68 2022-03-10 0600) ?SpO2:  [95 %-100 %] 99 % 03/10/22 0731) ?Arterial Line BP: (96-125)/(45-63) 117/55 10-Mar-2022 0700) ?FiO2 (%):  [40 %] 40 % 03/10/22 0731) ?Weight:  [76.7 kg] 76.7 kg 03/10/22 0354) ?Last BM Date :  (PTA) ? ?Intake/Output from previous day: ?03/05 0701 - 03/10/2022 0700 ?In: 953.5 [I.V.:653.7; IV Piggyback:299.8] ?Out: 2090 [Urine:2090] ?Intake/Output this shift: ?No intake/output data recorded. ? ?Lungs: inutabed, sedated, nonlabored ?Cv regular ?Abd: soft, lower midline incision; LIH - out but is reducible with some difficulty, he does awake when I palpate this and is mildly tender ?Extremities: no edema ?Neuro: intubated,sedated ?  ? ?Lab Results:  ?Recent Labs  ?  03/05/21 ?0500 03/06/21 ?0335  ?WBC 14.6* 11.5*  ?HGB 10.8* 10.8*  ?HCT 33.1* 33.3*  ?PLT 191 188  ? ?BMET ?Recent Labs  ?  03/05/21 ?0500 03/06/21 ?0335  ?NA 141 144  ?K 3.6 3.7  ?CL 97* 103  ?CO2 31 31  ?GLUCOSE 141* 120*  ?BUN 67* 60*  ?CREATININE 2.65* 2.14*  ?CALCIUM 7.1* 7.3*  ? ?PT/INR ?No results for input(s): LABPROT, INR in the last 72 hours. ?ABG ?Recent Labs  ?  03/04/21 ?0421 03/04/21 ?1058  ?PHART 7.442 7.484*  ?HCO3 31.6* 31.9*  ? ? ?Studies/Results: ?No results found. ? ?Anti-infectives: ?Anti-infectives (From admission, onward)  ? ? Start     Dose/Rate Route Frequency Ordered Stop  ? 03/05/21 1400  piperacillin-tazobactam (ZOSYN) IVPB 3.375 g  Status:  Discontinued       ? 3.375 g ?12.5 mL/hr over 240 Minutes Intravenous Every 8 hours 03/05/21 0855 03/05/21 1137  ? 03/05/21 1300  meropenem (MERREM) 500 mg in sodium chloride 0.9 % 100 mL IVPB       ? 500 mg ?200 mL/hr over 30 Minutes Intravenous Every 12 hours 03/05/21 1205    ? 03/02/21 1600   piperacillin-tazobactam (ZOSYN) IVPB 2.25 g  Status:  Discontinued       ? 2.25 g ?100 mL/hr over 30 Minutes Intravenous Every 8 hours 03/02/21 0720 03/05/21 0855  ? 03/02/21 0730  piperacillin-tazobactam (ZOSYN) IVPB 3.375 g       ? 3.375 g ?100 mL/hr over 30 Minutes Intravenous STAT 03/02/21 0720 03/02/21 0809  ? 03/02/21 0715  piperacillin-tazobactam (ZOSYN) IVPB 3.375 g  Status:  Discontinued       ? 3.375 g ?12.5 mL/hr over 240 Minutes Intravenous STAT 03/02/21 0711 03/02/21 0719  ? 03/02/21 0515  ceFEPIme (MAXIPIME) 2 g in sodium chloride 0.9 % 100 mL IVPB       ? 2 g ?200 mL/hr over 30 Minutes Intravenous  Once 03/02/21 0502 03/02/21 0658  ? 03/02/21 0515  metroNIDAZOLE (FLAGYL) IVPB 500 mg  Status:  Discontinued       ? 500 mg ?100 mL/hr over 60 Minutes Intravenous  Once 03/02/21 0502 03/02/21 0702  ? 03/02/21 0515  vancomycin (VANCOCIN) IVPB 1000 mg/200 mL premix  Status:  Discontinued       ? 1,000 mg ?200 mL/hr over 60 Minutes Intravenous  Once 03/02/21 0502 03/02/21 0510  ? 03/02/21 0515  vancomycin (VANCOREADY) IVPB 1500 mg/300 mL       ? 1,500 mg ?150 mL/hr over 120 Minutes Intravenous  Once 03/02/21 0510 03/02/21 1033  ? ?  ? ? ?  Assessment/Plan: ?Incarcerated left inguinal hernia that is reducible. ?Possible pneumatosis in the herniated bowel loop on CT 3/2 ?- he is improving from a shock and resp standpoint- continue to observe abdominal exam ? -His labs are improving.  Patient is mod to high risk for any operative intervention. Continue with conservative management at this time and continued resuscitation by CCM.  He is already on antibiotics for his pneumonia. Cont NGT to LIWS.  ?-will check xray today- concern does he still have some degree of obstruction. I think whenever ccm thinks medically ready does need to go get a [facility] repair ?-will have to see who will consent for him also ?FEN - NPO, NGT, IVF ?VTE - SCDs, okay for chemical prophylaxis from our standpoint ?ID - Zosyn ?Foley - In place  ?   ?Shock - resolved ?Acute resp failure - On vent, per CCM ?Multifocal PNA - on abx. Per CCM ?Acute urinary retention - s/p foley placement by urology 3/2. ?HTN ?COPD ?Hx CVA ?Hx bladder CA ?Disposition: I think he needs [facility] repair as it looks like that may have been what led to this hospitalization and it is not resolved ?  ?I reviewed nursing notes, hospitalist notes, last 24 h vitals and pain scores, last 48 h intake and output, last 24 h labs and trends, and last 24 h imaging results. ? ?This care required moderate level of medical decision making.  ? ? ?Addendum: I have left a message for Gentry Roch who is his contact.   I have tried to call other contact Fuller Mandril and this was wrong number.  I will wait to hear back from one of them before proceeding to OR which I would like to do tomorrow.  ?Rolm Bookbinder ?03/06/2021 ? ?

## 2021-03-06 NOTE — Progress Notes (Signed)
Assisted tele visit to patient with daughter. ° °Annamary Buschman P, RN  °

## 2021-03-06 NOTE — Progress Notes (Signed)
Patient ID: Jason [name], male   DOB: 06-20-1945, 76 y.o.   MRN: [id number] ?I discussed with Voncella over phone reason I think need to proceed to OR>  will plan for tomorrow. ?We discussed repair.  Goals should be achieved with surgery. We discussed the usage of mesh and the rationale behind that.  We have elected to perform open inguinal hernia repair with mesh.  We discussed the risks including bleeding, infection, recurrence, postoperative pain and chronic groin pain, testicular injury, urinary retention, numbness in groin and around incision. ? ?

## 2021-03-06 NOTE — Progress Notes (Signed)
PHARMACY NOTE:  ANTIMICROBIAL RENAL DOSAGE ADJUSTMENT ? ?Current antimicrobial regimen includes a mismatch between antimicrobial dosage and estimated renal function.  As per policy approved by the Pharmacy & Therapeutics and Medical Executive Committees, the antimicrobial dosage will be adjusted accordingly. ? ?Current antimicrobial dosage:  meropenem '500mg'$  IV q12h ? ?Indication: intra-abdominal infection ? ?Renal Function: ? ?Estimated Creatinine Clearance: 28 mL/min (A) (by C-G formula based on SCr of 2.14 mg/dL (H)). ? ?Antimicrobial dosage has been changed to:  meropenem 1g IV q12h ? ?Additional comments: ? ? ?Arturo Morton, PharmD, BCPS ?Please check AMION for all [facility] contact numbers ?Clinical Pharmacist ?03/06/2021 7:58 AM ?

## 2021-03-07 ENCOUNTER — Inpatient Hospital Stay (HOSPITAL_COMMUNITY): Payer: Medicare (Managed Care)

## 2021-03-07 ENCOUNTER — Other Ambulatory Visit: Payer: Self-pay

## 2021-03-07 ENCOUNTER — Encounter (HOSPITAL_COMMUNITY)
Admission: EM | Disposition: A | Discharge status: Hospice | Payer: Self-pay | Source: Skilled Nursing Facility | Attending: Internal Medicine

## 2021-03-07 ENCOUNTER — Inpatient Hospital Stay (HOSPITAL_COMMUNITY): Payer: Medicare (Managed Care) | Admitting: Anesthesiology

## 2021-03-07 DIAGNOSIS — M7989 Other specified soft tissue disorders: Secondary | ICD-10-CM | POA: Diagnosis not present

## 2021-03-07 DIAGNOSIS — A419 Sepsis, unspecified organism: Secondary | ICD-10-CM | POA: Diagnosis not present

## 2021-03-07 DIAGNOSIS — K56609 Unspecified intestinal obstruction, unspecified as to partial versus complete obstruction: Secondary | ICD-10-CM | POA: Diagnosis not present

## 2021-03-07 DIAGNOSIS — J690 Pneumonitis due to inhalation of food and vomit: Secondary | ICD-10-CM | POA: Diagnosis not present

## 2021-03-07 DIAGNOSIS — K460 Unspecified abdominal hernia with obstruction, without gangrene: Secondary | ICD-10-CM

## 2021-03-07 DIAGNOSIS — N179 Acute kidney failure, unspecified: Secondary | ICD-10-CM | POA: Diagnosis not present

## 2021-03-07 HISTORY — PX: INSERTION OF MESH: SHX5868

## 2021-03-07 HISTORY — PX: INGUINAL HERNIA REPAIR: SHX194

## 2021-03-07 LAB — COMPREHENSIVE METABOLIC PANEL
ALT: 35 U/L (ref 0–44)
AST: 54 U/L — ABNORMAL HIGH (ref 15–41)
Albumin: 1.8 g/dL — ABNORMAL LOW (ref 3.5–5.0)
Alkaline Phosphatase: 158 U/L — ABNORMAL HIGH (ref 38–126)
Anion gap: 13 (ref 5–15)
BUN: 48 mg/dL — ABNORMAL HIGH (ref 8–23)
CO2: 30 mmol/L (ref 22–32)
Calcium: 7.6 mg/dL — ABNORMAL LOW (ref 8.9–10.3)
Chloride: 106 mmol/L (ref 98–111)
Creatinine, Ser: 1.98 mg/dL — ABNORMAL HIGH (ref 0.61–1.24)
GFR, Estimated: 34 mL/min — ABNORMAL LOW (ref >60)
Glucose, Bld: 128 mg/dL — ABNORMAL HIGH (ref 70–99)
Potassium: 3.6 mmol/L (ref 3.5–5.1)
Sodium: 149 mmol/L — ABNORMAL HIGH (ref 135–145)
Total Bilirubin: 1.1 mg/dL (ref 0.3–1.2)
Total Protein: 5.9 g/dL — ABNORMAL LOW (ref 6.5–8.1)

## 2021-03-07 LAB — CULTURE, BLOOD (ROUTINE X 2)
Culture: NO GROWTH
Culture: NO GROWTH

## 2021-03-07 LAB — POCT I-STAT, CHEM 8
BUN: 39 mg/dL — ABNORMAL HIGH (ref 8–23)
Calcium, Ion: 1.07 mmol/L — ABNORMAL LOW (ref 1.15–1.40)
Chloride: 107 mmol/L (ref 98–111)
Creatinine, Ser: 1.6 mg/dL — ABNORMAL HIGH (ref 0.61–1.24)
Glucose, Bld: 146 mg/dL — ABNORMAL HIGH (ref 70–99)
HCT: 40 % (ref 39.0–52.0)
Hemoglobin: 13.6 g/dL (ref 13.0–17.0)
Potassium: 3.3 mmol/L — ABNORMAL LOW (ref 3.5–5.1)
Sodium: 149 mmol/L — ABNORMAL HIGH (ref 135–145)
TCO2: 31 mmol/L (ref 22–32)

## 2021-03-07 LAB — POCT I-STAT 7, (LYTES, BLD GAS, ICA,H+H)
Acid-Base Excess: 9 mmol/L — ABNORMAL HIGH (ref 0.0–2.0)
Bicarbonate: 33.9 mmol/L — ABNORMAL HIGH (ref 20.0–28.0)
Calcium, Ion: 1.08 mmol/L — ABNORMAL LOW (ref 1.15–1.40)
HCT: 32 % — ABNORMAL LOW (ref 39.0–52.0)
Hemoglobin: 10.9 g/dL — ABNORMAL LOW (ref 13.0–17.0)
O2 Saturation: 87 %
Patient temperature: 37
Potassium: 3.3 mmol/L — ABNORMAL LOW (ref 3.5–5.1)
Sodium: 149 mmol/L — ABNORMAL HIGH (ref 135–145)
TCO2: 35 mmol/L — ABNORMAL HIGH (ref 22–32)
pCO2 arterial: 48 mmHg (ref 32–48)
pH, Arterial: 7.457 — ABNORMAL HIGH (ref 7.35–7.45)
pO2, Arterial: 51 mmHg — ABNORMAL LOW (ref 83–108)

## 2021-03-07 LAB — CBC
HCT: 33.8 % — ABNORMAL LOW (ref 39.0–52.0)
Hemoglobin: 10.7 g/dL — ABNORMAL LOW (ref 13.0–17.0)
MCH: 22 pg — ABNORMAL LOW (ref 26.0–34.0)
MCHC: 31.7 g/dL (ref 30.0–36.0)
MCV: 69.4 fL — ABNORMAL LOW (ref 80.0–100.0)
Platelets: 188 10*3/uL (ref 150–400)
RBC: 4.87 MIL/uL (ref 4.22–5.81)
RDW: 15.3 % (ref 11.5–15.5)
WBC: 15.3 10*3/uL — ABNORMAL HIGH (ref 4.0–10.5)
nRBC: 0 % (ref 0.0–0.2)

## 2021-03-07 LAB — GLUCOSE, CAPILLARY
Glucose-Capillary: 110 mg/dL — ABNORMAL HIGH (ref 70–99)
Glucose-Capillary: 122 mg/dL — ABNORMAL HIGH (ref 70–99)
Glucose-Capillary: 110 mg/dL — ABNORMAL HIGH (ref 70–99)
Glucose-Capillary: 125 mg/dL — ABNORMAL HIGH (ref 70–99)
Glucose-Capillary: 142 mg/dL — ABNORMAL HIGH (ref 70–99)
Glucose-Capillary: 135 mg/dL — ABNORMAL HIGH (ref 70–99)
Glucose-Capillary: 137 mg/dL — ABNORMAL HIGH (ref 70–99)
Glucose-Capillary: 147 mg/dL — ABNORMAL HIGH (ref 70–99)
Glucose-Capillary: 68 mg/dL — ABNORMAL LOW (ref 70–99)

## 2021-03-07 LAB — MAGNESIUM: Magnesium: 2.5 mg/dL — ABNORMAL HIGH (ref 1.7–2.4)

## 2021-03-07 SURGERY — REPAIR, HERNIA, INGUINAL, ADULT
Anesthesia: General | Site: Inguinal | Laterality: Left

## 2021-03-07 MED ORDER — ONDANSETRON HCL 4 MG/2ML IJ SOLN
INTRAMUSCULAR | Status: DC | PRN
  Administered 2021-03-07: 4 mg via INTRAVENOUS

## 2021-03-07 MED ORDER — LACTATED RINGERS IV SOLN: INTRAVENOUS | Status: DC | PRN

## 2021-03-07 MED ORDER — PHENYLEPHRINE 40 MCG/ML (10ML) SYRINGE FOR IV PUSH (FOR BLOOD PRESSURE SUPPORT)
PREFILLED_SYRINGE | INTRAVENOUS | Status: DC | PRN
  Administered 2021-03-07 (×4): 80 ug via INTRAVENOUS

## 2021-03-07 MED ORDER — FENTANYL CITRATE (PF) 250 MCG/5ML IJ SOLN
INTRAMUSCULAR | Status: DC | PRN
Start: 2021-03-07 — End: 2021-03-07
  Administered 2021-03-07: 100 ug via INTRAVENOUS
  Administered 2021-03-07: 50 ug via INTRAVENOUS
  Administered 2021-03-07: 100 ug via INTRAVENOUS

## 2021-03-07 MED ORDER — BUPIVACAINE HCL (PF) 0.25 % IJ SOLN
INTRAMUSCULAR | Status: AC
  Filled 2021-03-07: qty 30

## 2021-03-07 MED ORDER — PHENYLEPHRINE 40 MCG/ML (10ML) SYRINGE FOR IV PUSH (FOR BLOOD PRESSURE SUPPORT)
PREFILLED_SYRINGE | INTRAVENOUS | Status: AC
  Filled 2021-03-07: qty 20

## 2021-03-07 MED ORDER — DEXTROSE 5 % IV SOLN: INTRAVENOUS | Status: DC

## 2021-03-07 MED ORDER — PHENYLEPHRINE HCL (PRESSORS) 10 MG/ML IV SOLN
INTRAVENOUS | Status: AC
  Filled 2021-03-07: qty 1

## 2021-03-07 MED ORDER — ARTIFICIAL TEARS OPHTHALMIC OINT
TOPICAL_OINTMENT | OPHTHALMIC | Status: DC | PRN
  Administered 2021-03-07: 1 via OPHTHALMIC

## 2021-03-07 MED ORDER — 0.9 % SODIUM CHLORIDE (POUR BTL) OPTIME
TOPICAL | Status: DC | PRN
  Administered 2021-03-07: 1000 mL

## 2021-03-07 MED ORDER — BUPIVACAINE HCL (PF) 0.25 % IJ SOLN
INTRAMUSCULAR | Status: DC | PRN
  Administered 2021-03-07: 10 mL

## 2021-03-07 MED ORDER — FENTANYL CITRATE (PF) 250 MCG/5ML IJ SOLN
INTRAMUSCULAR | Status: AC
  Filled 2021-03-07: qty 5

## 2021-03-07 SURGICAL SUPPLY — 42 items
ADH SKN CLS APL DERMABOND .7 (GAUZE/BANDAGES/DRESSINGS) ×1
APL PRP STRL LF DISP 70% ISPRP (MISCELLANEOUS) ×1
BAG COUNTER SPONGE SURGICOUNT (BAG) ×3 IMPLANT
BAG SPNG CNTER NS LX DISP (BAG) ×1
BAG SURGICOUNT SPONGE COUNTING (BAG) ×1
BLADE CLIPPER SURG (BLADE) ×2 IMPLANT
CANISTER SUCT 3000ML PPV (MISCELLANEOUS) ×2 IMPLANT
CHLORAPREP W/TINT 26 (MISCELLANEOUS) ×4 IMPLANT
COVER SURGICAL LIGHT HANDLE (MISCELLANEOUS) ×4 IMPLANT
DECANTER SPIKE VIAL GLASS SM (MISCELLANEOUS) IMPLANT
DERMABOND ADVANCED (GAUZE/BANDAGES/DRESSINGS) ×2
DERMABOND ADVANCED .7 DNX12 (GAUZE/BANDAGES/DRESSINGS) ×2 IMPLANT
DRAIN PENROSE 1/2X12 LTX STRL (WOUND CARE) ×2 IMPLANT
DRAPE LAPAROTOMY TRNSV 102X78 (DRAPES) ×4 IMPLANT
ELECT CAUTERY BLADE 6.4 (BLADE) ×4 IMPLANT
ELECT REM PT RETURN 9FT ADLT (ELECTROSURGICAL) ×3
ELECTRODE REM PT RTRN 9FT ADLT (ELECTROSURGICAL) ×2 IMPLANT
GAUZE 4X4 16PLY ~~LOC~~+RFID DBL (SPONGE) ×4 IMPLANT
GLOVE SURG ENC MOIS LTX SZ7 (GLOVE) ×4 IMPLANT
GLOVE SURG UNDER POLY LF SZ7.5 (GLOVE) ×4 IMPLANT
GOWN STRL REUS W/ TWL LRG LVL3 (GOWN DISPOSABLE) ×4 IMPLANT
GOWN STRL REUS W/TWL LRG LVL3 (GOWN DISPOSABLE) ×6
KIT BASIN OR (CUSTOM PROCEDURE TRAY) ×4 IMPLANT
KIT TURNOVER KIT B (KITS) ×4 IMPLANT
MARKER SKIN DUAL TIP RULER LAB (MISCELLANEOUS) ×4 IMPLANT
MESH ULTRAPRO 3X6 7.6X15CM (Mesh General) ×2 IMPLANT
NDL HYPO 25GX1X1/2 BEV (NEEDLE) ×2 IMPLANT
NEEDLE HYPO 25GX1X1/2 BEV (NEEDLE) ×3 IMPLANT
NS IRRIG 1000ML POUR BTL (IV SOLUTION) ×4 IMPLANT
PACK GENERAL/GYN (CUSTOM PROCEDURE TRAY) ×4 IMPLANT
PAD ARMBOARD 7.5X6 YLW CONV (MISCELLANEOUS) ×4 IMPLANT
PENCIL SMOKE EVACUATOR (MISCELLANEOUS) ×2 IMPLANT
SUT MNCRL AB 4-0 PS2 18 (SUTURE) ×4 IMPLANT
SUT VIC AB 2-0 CT1 27 (SUTURE) ×3
SUT VIC AB 2-0 CT1 TAPERPNT 27 (SUTURE) ×2 IMPLANT
SUT VIC AB 2-0 SH 18 (SUTURE) ×4 IMPLANT
SUT VIC AB 3-0 SH 27 (SUTURE) ×6
SUT VIC AB 3-0 SH 27XBRD (SUTURE) ×2 IMPLANT
SUT VICRYL AB 2 0 TIES (SUTURE) ×4 IMPLANT
SYR CONTROL 10ML LL (SYRINGE) ×4 IMPLANT
TOWEL GREEN STERILE (TOWEL DISPOSABLE) ×4 IMPLANT
TOWEL GREEN STERILE FF (TOWEL DISPOSABLE) ×4 IMPLANT

## 2021-03-07 NOTE — Progress Notes (Signed)
Assisted tele visit to patient with daughter. ° °Deveron Shamoon P, RN  °

## 2021-03-07 NOTE — Progress Notes (Signed)
RN aware of DC central line order 

## 2021-03-07 NOTE — Progress Notes (Signed)
Left upper extremity venous duplex has been completed. ?Preliminary results can be found in CV Proc through chart review.  ? ?03/07/21 1:31 PM ?Carlos Levering RVT   ?

## 2021-03-07 NOTE — Anesthesia Preprocedure Evaluation (Addendum)
Anesthesia Evaluation  ?Patient identified by MRN, date of birth, ID band ?Patient confused ? ? ? ?Reviewed: ?Allergy & Precautions, NPO status , Patient's Chart, lab work & pertinent test results ? ?Airway ?Mallampati: Intubated ? ?TM Distance: >3 FB ?Neck ROM: Full ? ? ? Dental ?  ?Pulmonary ?COPD, Current Smoker,  ?  ? ?+ decreased breath sounds ? ? ? ? ? Cardiovascular ?hypertension, Pt. on medications ?+ dysrhythmias (RBBB)  ?Rhythm:Regular Rate:Tachycardia ? ?Sinus tachycardia ?Right bundle branch block ?Confirmed by Dory Horn) on 03/02/2021 5:57:38 AM ? ?Abnormal septal motion . Left ventricular ejection fraction, by estimation, is 50 to 55%. ?The left ventricle has low normal function. The left ventricle has no regional wall ?motion abnormalities. Left ventricular diastolic parameters are consistent with Grade ?II diastolic dysfunction (pseudonormalization). Elevated left ventricular end-diastolic ?pressure. ?1. ?Right ventricular systolic function is moderately reduced. The right ventricular size is ?moderately enlarged. ?2. ?The mitral valve is abnormal. No evidence of mitral valve regurgitation. No evidence of ?mitral stenosis. Moderate mitral annular calcification. ?3. ?The aortic valve is normal in structure. Aortic valve regurgitation is not visualized. No ?aortic stenosis is present. ?4. ?The inferior vena cava is dilated in size with >50% respiratory variability, suggesting ?right atrial pressure of 8 mmHg ?  ?Neuro/Psych ?PSYCHIATRIC DISORDERS Dementia CVA   ? GI/Hepatic ?Neg liver ROS, Incarcerated hernia/sepsis ?  ?Endo/Other  ?negative endocrine ROS ? Renal/GU ?CRF and Renal InsufficiencyRenal disease  ?negative genitourinary ?  ?Musculoskeletal ?negative musculoskeletal ROS ?(+)  ? Abdominal ?  ?Peds ?negative pediatric ROS ?(+)  Hematology ? ?(+) Blood dyscrasia, anemia ,   ?Anesthesia Other Findings ? ? Reproductive/Obstetrics ?negative OB ROS ? ?   ? ? ? ? ? ? ? ? ? ? ? ? ? ?  ?  ? ? ? ? ? ? ? ?Anesthesia Physical ?Anesthesia Plan ? ?ASA: 3 and emergent ? ?Anesthesia Plan: General  ? ?Post-op Pain Management: Tylenol PO (pre-op)* and Toradol IV (intra-op)*  ? ?Induction: Rapid sequence and Intravenous ? ?PONV Risk Score and Plan: 1 and Ondansetron and Dexamethasone ? ?Airway Management Planned: Oral ETT ? ?Additional Equipment: None ? ?Intra-op Plan:  ? ?Post-operative Plan:  ? ?Informed Consent: I have reviewed the patients History and Physical, chart, labs and discussed the procedure including the risks, benefits and alternatives for the proposed anesthesia with the patient or authorized representative who has indicated his/her understanding and acceptance.  ? ? ? ?Dental advisory given and Consent reviewed with POA ? ?Plan Discussed with: Anesthesiologist and CRNA ? ?Anesthesia Plan Comments:   ? ? ? ? ? ?Anesthesia Quick Evaluation ? ?

## 2021-03-07 NOTE — Progress Notes (Signed)
? ?Subjective/Chief Complaint: ?Intubated, sedated, arousable ? ? ?Objective: ?Vital signs in last 24 hours: ?Temp:  [99.2 ?F (37.3 ?C)-101.7 ?F (38.7 ?C)] 100.9 ?F (38.3 ?C) (03/07 0430) ?Pulse Rate:  [79-111] 97 (03/07 0430) ?Resp:  [0-31] 31 (03/07 0430) ?BP: (119-141)/(57-80) 129/65 (03/07 0400) ?SpO2:  [92 %-99 %] 98 % (03/07 0430) ?Arterial Line BP: (114-147)/(48-68) 128/52 (03/07 0430) ?FiO2 (%):  [40 %] 40 % (03/07 0325) ?Weight:  [74.5 kg] 74.5 kg (03/07 0500) ?Last BM Date :  (PTA) ? ?Intake/Output from previous day: ?03/24/22 0701 - 03/07 0700 ?In: 1018.7 [I.V.:793.2; NG/GT:15; IV Piggyback:210.6] ?Out: 2065 [Urine:1940; Emesis/NG output:125] ?Intake/Output this shift: ?No intake/output data recorded. ? ?  ?Lungs: inutabed, sedated, nonlabored ?Cv regular ?Abd: soft, lower midline incision; LIH - out but is reducible with some difficulty ?Extremities: no edema ?Neuro: intubated,sedated ?  ? ?Lab Results:  ?Recent Labs  ?  03/06/21 ?3536 03/07/21 ?0542  ?WBC 11.5* 15.3*  ?HGB 10.8* 10.7*  ?HCT 33.3* 33.8*  ?PLT 188 188  ? ?BMET ?Recent Labs  ?  03/06/21 ?1443 03/07/21 ?0542  ?NA 144 149*  ?K 3.7 3.6  ?CL 103 106  ?CO2 31 30  ?GLUCOSE 120* 128*  ?BUN 60* 48*  ?CREATININE 2.14* 1.98*  ?CALCIUM 7.3* 7.6*  ? ?PT/INR ?No results for input(s): LABPROT, INR in the last 72 hours. ?ABG ?Recent Labs  ?  03/04/21 ?1058  ?PHART 7.484*  ?HCO3 31.9*  ? ? ?Studies/Results: ?DG Abd Portable 1V ? ?Result Date: 03/06/2021 ?CLINICAL DATA:  Evaluate NG tube placement EXAM: PORTABLE ABDOMEN - 1 VIEW COMPARISON:  03/02/2021 FINDINGS: Tip of enteric tube is seen in the stomach. Side-port in the enteric tube is at the gastroesophageal junction. Bowel gas pattern is nonspecific. No abnormal masses or calcifications are seen. Lower pelvis is not included in the image. Degenerative changes are noted in the lumbar spine. IMPRESSION: Tip of enteric tube is seen in the stomach. Side-port in the enteric tube is at the gastroesophageal  junction. NG tube could be advanced 5 cm to place the side port within the stomach. Nonspecific bowel gas pattern. Electronically Signed   By: Elmer Picker M.D.   On: 03/06/2021 09:03   ? ?Anti-infectives: ?Anti-infectives (From admission, onward)  ? ? Start     Dose/Rate Route Frequency Ordered Stop  ? 03/06/21 1000  meropenem (MERREM) 1 g in sodium chloride 0.9 % 100 mL IVPB       ? 1 g ?200 mL/hr over 30 Minutes Intravenous Every 12 hours 03/06/21 0756    ? 03/05/21 1400  piperacillin-tazobactam (ZOSYN) IVPB 3.375 g  Status:  Discontinued       ? 3.375 g ?12.5 mL/hr over 240 Minutes Intravenous Every 8 hours 03/05/21 0855 03/05/21 1137  ? 03/05/21 1300  meropenem (MERREM) 500 mg in sodium chloride 0.9 % 100 mL IVPB  Status:  Discontinued       ? 500 mg ?200 mL/hr over 30 Minutes Intravenous Every 12 hours 03/05/21 1205 03/06/21 0756  ? 03/02/21 1600  piperacillin-tazobactam (ZOSYN) IVPB 2.25 g  Status:  Discontinued       ? 2.25 g ?100 mL/hr over 30 Minutes Intravenous Every 8 hours 03/02/21 0720 03/05/21 0855  ? 03/02/21 0730  piperacillin-tazobactam (ZOSYN) IVPB 3.375 g       ? 3.375 g ?100 mL/hr over 30 Minutes Intravenous STAT 03/02/21 0720 03/02/21 0809  ? 03/02/21 0715  piperacillin-tazobactam (ZOSYN) IVPB 3.375 g  Status:  Discontinued       ?  3.375 g ?12.5 mL/hr over 240 Minutes Intravenous STAT 03/02/21 0711 03/02/21 0719  ? 03/02/21 0515  ceFEPIme (MAXIPIME) 2 g in sodium chloride 0.9 % 100 mL IVPB       ? 2 g ?200 mL/hr over 30 Minutes Intravenous  Once 03/02/21 0502 03/02/21 0658  ? 03/02/21 0515  metroNIDAZOLE (FLAGYL) IVPB 500 mg  Status:  Discontinued       ? 500 mg ?100 mL/hr over 60 Minutes Intravenous  Once 03/02/21 0502 03/02/21 0702  ? 03/02/21 0515  vancomycin (VANCOCIN) IVPB 1000 mg/200 mL premix  Status:  Discontinued       ? 1,000 mg ?200 mL/hr over 60 Minutes Intravenous  Once 03/02/21 0502 03/02/21 0510  ? 03/02/21 0515  vancomycin (VANCOREADY) IVPB 1500 mg/300 mL       ? 1,500  mg ?150 mL/hr over 120 Minutes Intravenous  Once 03/02/21 0510 03/02/21 1033  ? ?  ? ? ?Assessment/Plan: ?Incarcerated left inguinal hernia that is reducible. ?Possible pneumatosis in the herniated bowel loop on CT 3/2 ?-plan for [facility] repair today ?FEN - NPO, NGT, IVF ?VTE - SCDs, okay for chemical prophylaxis from our standpoint ?ID - Zosyn ?Foley - In place  ?  ?Shock - resolved ?Acute resp failure - On vent, per CCM ?Multifocal PNA - on abx. Per CCM ?Acute urinary retention - s/p foley placement by urology 3/2. ?HTN ?COPD ?Hx CVA ?Hx bladder CA ? ?  ?I reviewed nursing notes, hospitalist notes, last 24 h vitals and pain scores, last 48 h intake and output, last 24 h labs and trends, and last 24 h imaging results. ?  ?This care required moderate level of medical decision making.  ? ?Rolm Bookbinder ?03/07/2021 ? ?

## 2021-03-07 NOTE — Progress Notes (Addendum)
? [name], MRN:  [id number], DOB:  Aug 09, 1945, LOS: 5 ?ADMISSION DATE:  03/02/2021, CONSULTATION DATE:  03/07/2021 ?REFERRING MD:  Jason Kindle, MD CHIEF COMPLAINT:  SOB  ? ?History of Present Illness:  ?Pt is a 76 yo m w/ PMhx of dementia, bladder cancer presenting from SNF for SOB and emesis since early Tuesday AM. On arrival he was found to be somnolent w/ rhonchus breathing, sats in low 70s. Immediately intubated by ED MD and found feculent emesis throughout oropharynx and airway. Upon OGT placement had ~ 1L of immediate output. Per reports pt had associated tachypnea, fever and emesis since early Tuesday AM as well. No family present.  ? ?CT ab/pelvis on my review concerning for pneumatosis & SBO involving left inguinal & scrotum. Final read pending. ED MD was able to reduce palpable anterior abdominal wall portion however scrotum remains full. ED nursing unable to pass foley despite multiple attempts.  ? ?Pertinent  Medical History  ?Dementia  ?? Hx of Bladder cancer  ? ?Significant Hospital Events: ?Including procedures, antibiotic start and stop dates in addition to other pertinent events   ?3/2: admission for septic shock; central line and a-line placed ?3/3: decreased pressor and vent requirement. Echo with normal EF; G2DD. Moderate RV enlargement; P/F 153 ?3/4: off levo; on vaso alone. P/F 186 ?3/5: off pressors ?3/7: inguinal hernia repair.  ?Interim History / Subjective:  ? ?Remains intubated and sedated. ?Febrile overnight with Tmax 101.12F>>received tylenol. Remainder of vitals remain stable.  ? ?24h UOP 1.9L.  ?No BM ? ?Labs reviewed: WBC 11.5>15.3. Sodium 149; Creatinine 2.65>2.14>1.98; BUN 67>60>48. Alk phos 782>956>213 ? ?3/2 BC: NGTD ?3/2 UC: 100k colonies of e.coli ESBL ?3/2 respiratory culture: no organisms seen ? ?Objective   ?Blood pressure 129/65, pulse 97, temperature (!) 100.9 ?F (38.3 ?C), temperature source Bladder, resp. rate (!) 31, height $RemoveBe'5\' 8"'pdptQfHZq$  (1.727 m), weight 74.5 kg, SpO2  98 %. ?   ?Vent Mode: PRVC ?FiO2 (%):  [40 %] 40 % ?Set Rate:  [24 bmp] 24 bmp ?Vt Set:  [410 mL] 410 mL ?PEEP:  [8 cmH20] 8 cmH20 ?Pressure Support:  [8 cmH20] 8 cmH20  ? ?Intake/Output Summary (Last 24 hours) at 03/07/2021 0736 ?Last data filed at 03/07/2021 0500 ?Gross per 24 hour  ?Intake 1018.74 ml  ?Output 2065 ml  ?Net -1046.26 ml  ? ? ?Filed Weights  ? 03/05/21 0500 03/06/21 0354 03/07/21 0500  ?Weight: 77.6 kg 76.7 kg 74.5 kg  ? ? ?Examination: ?General: critically ill appearing, intubated ?Cardiac: RRR, no LE edema ?Pulm: on full vent support. Mechanical breath sounds ?GI: absent bowel sounds. Abd mildly distended. ?Skin: no rash or lesion. No sacral ulcers.  ?Neuro: opens eyes to voice. Does not follow commands.  ? ?Resolved Hospital Problem list   ?Septic shock secondary to aspiration pna vs e.coli UTI ? ?Assessment & Plan:  ? ?Acute respiratory failure with hypoxia requiring mechanical ventilation ?-continue full vent support in anticipation of surgery later today. Can resume SBTs tomorrow.  ?-On fentanyl $RemoveBefor'@125mcg'PjSyyVrVKKNL$ /hr. Continue for RASS goal of -1 to -2 ?-Continue merrem ? ?ESBL E.coli UTI ?Aspiration pneumonia ?3/2 BC: NGTD ?3/2 UC: 100k colonies of e.coli ESBL ?3/2 respiratory culture: no organisms seen ?Febrile overnight. Tmax 101.7. WBC increasing.  ?-cont Merrem  ?-if LUE duplex is negative, may need to consider repeating cultures ?-CXR ?-remove central line and a-line after surgery ? ?SBO with incarcerated left inguinal hernia ?No BM since admission. ?-inguinal hernia repair per gen surg today ?-NPO ? ?  AKI due to septic shock, hypovolemia ?Urethral stricture s/p foley placement by urology ?-Renal function continuing to improve. Good UOP. Continue to monitor.  ?-Catheter to remain in place per urology ? ?Hypernatremia. Likely hypovolemic in nature.  ?-start D5 infusion ? ?LUE swelling.  ?-check DVT studies ? ?Transient atrial fibrillation.  ?Likely due to acute illness. Back in SR. CHADS2VASC 4.  ?-cont  amio  ?-hold anticoagulation in anticipation of surgery  ? ?GERD ?-PPI ? ?Dementia  ?- Ambulates, AAOx3. Needs ADL assistance and minimally communicative   ? ?Moderate malnutrition ?-remains NPO due to ongoing SBO ?-no indication for TPN right now  ? ?Best Practice (right click and "Reselect all SmartList Selections" daily)  ? ?Diet/type: NPO ?DVT prophylaxis: prophylactic heparin  ?GI prophylaxis: PPI ?Lines: Central line ?Foley:  Yes, and it is still needed ?Code Status:  full code ?Family updated 3/6. ? ?Jason Hansen, MD ?Internal Medicine Resident PGY-3 ?[name] ?Pager: [phone] ?03/07/2021 7:36 AM  ?  ?

## 2021-03-07 NOTE — Transfer of Care (Signed)
Immediate Anesthesia Transfer of Care Note ? ?Patient: Jason [name] ? ?Procedure(s) Performed: HERNIA REPAIR INGUINAL WITH MESH (Left: Inguinal) ?INSERTION OF MESH (Left: Inguinal) ? ?Patient Location: ICU ? ?Anesthesia Type:General ? ?Level of Consciousness: Patient remains intubated per anesthesia plan ? ?Airway & Oxygen Therapy: Patient remains intubated per anesthesia plan and Patient placed on Ventilator (see vital sign flow sheet for setting) ? ?Post-op Assessment: Report given to RN and Post -op Vital signs reviewed and stable ? ?Post vital signs: Reviewed and stable ? ?Last Vitals:  ?Vitals Value Taken Time  ?BP    ?Temp    ?Pulse    ?Resp    ?SpO2    ? ? ?Last Pain:  ?Vitals:  ? 03/07/21 0730  ?TempSrc: Esophageal  ?PainSc:   ?   ? ?  ? ?Complications: No notable events documented. ?

## 2021-03-07 NOTE — Op Note (Signed)
Preoperative diagnosis: incarcerated LIH with resultant sbo, aspiration pna ?Postoperative diagnosis: saa ?Procedure: Left inguinal hernia repair with Ultrapro mesh ?Surgeon: Dr Serita Grammes ?Asst: Barkley Boards, PA-C ?EBL minimal ?Anesthesia: general ?Complications none ?Drains none ?Specimens none ?Sponge and needle count correct ?Dispo recovery stable ? ?Indications; This is a 62 yom who had incarcerated LIH with SBO when he arrived. This was reduced and has been managed conservatively.  He physiologically is much better.  His hernia keeps popping out although it is reducible.  I discussed with the critical care team as well as the patient's daughter going ahead and repairing this at this point as I am concerned that she is going to happen again when he comes off the ventilator. ? ?Procedure: After informed consent was obtained from the patient's daughter we proceeded to the operating room.  He was already on antibiotics.  SCDs were in place.  He was placed under general anesthesia without complication.  His left groin was prepped and draped in the standard sterile surgical fashion.  A surgical timeout was then performed. ? ?I have treated Marcaine throughout his left groin.  I then made an incision.  I carried this through the subcutaneous tissue down to the external oblique.  The external oblique was incised sharply.  I carried this through the external ring as well as laterally.  I then was able to encircle the spermatic cord.  He had a very chronic hernia sac.  The hernia was reduced prior to beginning.  I then was able to encircle the hernia sac in the anteromedial position.  His cord structures were identified and preserved.  I then entered into the sac.  There was a piece of small bowel that was present there I eviscerated this portion and this was all viable.  It was also not dilated at this time.  I then excised most of the stack.  I closed this with 3-0 Vicryl suture and placed it back into the  abdomen.  I then tightened the internal ring with a 2-0 Vicryl suture.  After this I fashioned a piece of ultra Pro mesh to cover the defect.  I sewed this into position at the pubic tubercle at 2 points with a 2-0 Vicryl suture.  I made a T cut and wrapped around the spermatic cord.  I secured this to the inguinal ligament with 2-0 Vicryl about every half centimeter.  I laid the lateral portions flat under the external oblique.  I tacked the T cut together with 2-0 Vicryl suture.  The mesh was in good position I completely obliterated the defect.  Hemostasis was observed.  I then closed the external oblique with 2-0 Vicryl.  Scarpa's fascia was closed with 3-0 Vicryl.  t  ?

## 2021-03-08 ENCOUNTER — Inpatient Hospital Stay (HOSPITAL_COMMUNITY): Payer: Medicare (Managed Care)

## 2021-03-08 ENCOUNTER — Encounter (HOSPITAL_COMMUNITY): Payer: Self-pay | Admitting: General Surgery

## 2021-03-08 DIAGNOSIS — J690 Pneumonitis due to inhalation of food and vomit: Secondary | ICD-10-CM | POA: Diagnosis not present

## 2021-03-08 DIAGNOSIS — N179 Acute kidney failure, unspecified: Secondary | ICD-10-CM | POA: Diagnosis not present

## 2021-03-08 DIAGNOSIS — A419 Sepsis, unspecified organism: Secondary | ICD-10-CM | POA: Diagnosis not present

## 2021-03-08 DIAGNOSIS — K56609 Unspecified intestinal obstruction, unspecified as to partial versus complete obstruction: Secondary | ICD-10-CM | POA: Diagnosis not present

## 2021-03-08 LAB — COMPREHENSIVE METABOLIC PANEL
ALT: 25 U/L (ref 0–44)
AST: 29 U/L (ref 15–41)
Albumin: 1.6 g/dL — ABNORMAL LOW (ref 3.5–5.0)
Alkaline Phosphatase: 114 U/L (ref 38–126)
Anion gap: 8 (ref 5–15)
BUN: 38 mg/dL — ABNORMAL HIGH (ref 8–23)
CO2: 31 mmol/L (ref 22–32)
Calcium: 7.5 mg/dL — ABNORMAL LOW (ref 8.9–10.3)
Chloride: 108 mmol/L (ref 98–111)
Creatinine, Ser: 1.54 mg/dL — ABNORMAL HIGH (ref 0.61–1.24)
GFR, Estimated: 46 mL/min — ABNORMAL LOW (ref >60)
Glucose, Bld: 155 mg/dL — ABNORMAL HIGH (ref 70–99)
Potassium: 3.3 mmol/L — ABNORMAL LOW (ref 3.5–5.1)
Sodium: 147 mmol/L — ABNORMAL HIGH (ref 135–145)
Total Bilirubin: 0.9 mg/dL (ref 0.3–1.2)
Total Protein: 5.6 g/dL — ABNORMAL LOW (ref 6.5–8.1)

## 2021-03-08 LAB — CBC
HCT: 32.1 % — ABNORMAL LOW (ref 39.0–52.0)
Hemoglobin: 10.1 g/dL — ABNORMAL LOW (ref 13.0–17.0)
MCH: 22.3 pg — ABNORMAL LOW (ref 26.0–34.0)
MCHC: 31.5 g/dL (ref 30.0–36.0)
MCV: 70.9 fL — ABNORMAL LOW (ref 80.0–100.0)
Platelets: 185 10*3/uL (ref 150–400)
RBC: 4.53 MIL/uL (ref 4.22–5.81)
RDW: 15.3 % (ref 11.5–15.5)
WBC: 14.3 10*3/uL — ABNORMAL HIGH (ref 4.0–10.5)
nRBC: 0 % (ref 0.0–0.2)

## 2021-03-08 LAB — GLUCOSE, CAPILLARY
Glucose-Capillary: 151 mg/dL — ABNORMAL HIGH (ref 70–99)
Glucose-Capillary: 81 mg/dL (ref 70–99)
Glucose-Capillary: 110 mg/dL — ABNORMAL HIGH (ref 70–99)
Glucose-Capillary: 168 mg/dL — ABNORMAL HIGH (ref 70–99)
Glucose-Capillary: 137 mg/dL — ABNORMAL HIGH (ref 70–99)
Glucose-Capillary: 162 mg/dL — ABNORMAL HIGH (ref 70–99)

## 2021-03-08 LAB — CULTURE, RESPIRATORY W GRAM STAIN

## 2021-03-08 LAB — PHOSPHORUS: Phosphorus: 1.7 mg/dL — ABNORMAL LOW (ref 2.5–4.6)

## 2021-03-08 LAB — MAGNESIUM: Magnesium: 2.3 mg/dL (ref 1.7–2.4)

## 2021-03-08 MED ORDER — VITAL AF 1.2 CAL PO LIQD
1000.0000 mL | ORAL | Status: DC
  Administered 2021-03-08: 1000 mL

## 2021-03-08 MED ORDER — PROSOURCE TF PO LIQD: 45.0000 mL | Freq: Two times a day (BID) | ORAL | Status: DC

## 2021-03-08 MED ORDER — POTASSIUM CHLORIDE 10 MEQ/50ML IV SOLN
10.0000 meq | INTRAVENOUS | Status: AC
  Administered 2021-03-08 (×6): 10 meq via INTRAVENOUS
  Filled 2021-03-08 (×6): qty 50

## 2021-03-08 MED ORDER — POTASSIUM PHOSPHATES 15 MMOLE/5ML IV SOLN
30.0000 mmol | Freq: Once | INTRAVENOUS | Status: AC
  Administered 2021-03-08: 30 mmol via INTRAVENOUS
  Filled 2021-03-08: qty 10

## 2021-03-08 MED ORDER — VITAL HIGH PROTEIN PO LIQD: 1000.0000 mL | ORAL | Status: DC

## 2021-03-08 NOTE — Anesthesia Postprocedure Evaluation (Signed)
Anesthesia Post Note ? ?Patient: Jason [name] ? ?Procedure(s) Performed: HERNIA REPAIR INGUINAL WITH MESH (Left: Inguinal) ?INSERTION OF MESH (Left: Inguinal) ? ?  ? ?Patient location during evaluation: PACU ?Anesthesia Type: General ?Level of consciousness: awake ?Pain management: pain level controlled ?Vital Signs Assessment: post-procedure vital signs reviewed and stable ?Respiratory status: spontaneous breathing and respiratory function stable ?Cardiovascular status: stable ?Postop Assessment: no apparent nausea or vomiting ?Anesthetic complications: no ? ? ?No notable events documented. ? ?Last Vitals:  ?Vitals:  ? 03/08/21 0700 03/08/21 0730  ?BP: 116/61   ?Pulse: 76 76  ?Resp: (!) 24 (!) 24  ?Temp: 37.5 ?C   ?SpO2: 99% 99%  ?  ?Last Pain:  ?Vitals:  ? 03/08/21 0700  ?TempSrc: Esophageal  ?PainSc:   ? ?Pain Goal:   ? ?LLE Motor Response: Purposeful movement (03/08/21 0730) ?  ?RLE Motor Response: Purposeful movement (03/08/21 0730) ?  ?  ?  ?  ? ?Merlinda Frederick ? ? ? ? ?

## 2021-03-08 NOTE — Progress Notes (Signed)
Asked by nurses to evaluate respiratory status. ? ?BP 129/68   Pulse (!) 115   Temp 98.3 ?F (36.8 ?C) (Axillary)   Resp (!) 25   Ht '5\' 8"'$  (1.727 m)   Wt 74.6 kg   SpO2 97%   BMI 25.01 kg/m?  ?Confused but arouses easily to my voice, maintains alertness. Not following commands to cough. ?Tachypneic, no accessory muscle use. Mild rhonchi bilaterally ?On 40L, 40% fiO2. ? ?Remains a high risk patient from a respiratory standpoint and requires ongoing ICU moniting. No indication that he is currently decompensating or requires intubation now. ?NTS PRN ? ?Julian Hy, DO 03/08/21 8:25 PM ?[facility] ? ?

## 2021-03-08 NOTE — Procedures (Signed)
Cortrak ? ?Person Inserting Tube:  Orie Baxendale, Creola Corn, RD ?Tube Type:  Cortrak - 43 inches ?Tube Size:  10 ?Tube Location:  Left nare ?Initial Placement:  Stomach ?Secured by: Dorann Lodge ?Technique Used to Measure Tube Placement:  Marking at nare/corner of mouth ?Cortrak Secured At:  70 cm ? ?Cortrak Tube Team Note: ? ?Consult received to place a Cortrak feeding tube.  ? ?X-ray is required, abdominal x-ray has been ordered by the Cortrak team. Please confirm tube placement before using the Cortrak tube.  ? ?If the tube becomes dislodged please keep the tube and contact the Cortrak team at [email or web address] (password [email or web address]) for replacement.  ?If after hours and replacement cannot be delayed, place a NG tube and confirm placement with an abdominal x-ray.  ? ? ? ?Theone Stanley., MS, RD, LDN (she/her/hers) ?RD pager number and weekend/on-call pager number located in [place]. ? ? ?

## 2021-03-08 NOTE — Progress Notes (Signed)
1 Day Post-Op   Subjective/Chief Complaint: intubated   Objective: Vital signs in last 24 hours: Temp:  [98.2 F (36.8 C)-100.8 F (38.2 C)] 99.5 F (37.5 C) (03/08 0700) Pulse Rate:  [76-99] 76 (03/08 0730) Resp:  [21-33] 24 (03/08 0730) BP: (112-153)/(44-69) 116/61 (03/08 0700) SpO2:  [99 %-100 %] 99 % (03/08 0730) Arterial Line BP: (112-159)/(45-55) 159/54 (03/07 2300) FiO2 (%):  [40 %] 40 % (03/08 0253) Weight:  [74.6 kg] 74.6 kg (03/08 0500) Last BM Date :  (PTA)  Intake/Output from previous day: 03/07 0701 - 03/08 0700 In: 3290.7 [I.V.:3021.1; NG/GT:30; IV Piggyback:239.6] Out: 1610 [Urine:1245; Emesis/NG output:60] Intake/Output this shift: No intake/output data recorded.  Abd soft, left groin incision without infection or drainage  Lab Results:  Recent Labs    03/07/21 0542 03/07/21 1423 03/07/21 1433 03/08/21 0500  WBC 15.3*  --   --  14.3*  HGB 10.7*   < > 13.6 10.1*  HCT 33.8*   < > 40.0 32.1*  PLT 188  --   --  185   < > = values in this interval not displayed.   BMET Recent Labs    03/07/21 0542 03/07/21 1423 03/07/21 1433 03/08/21 0500  NA 149*   < > 149* 147*  K 3.6   < > 3.3* 3.3*  CL 106  --  107 108  CO2 30  --   --  31  GLUCOSE 128*  --  146* 155*  BUN 48*  --  39* 38*  CREATININE 1.98*  --  1.60* 1.54*  CALCIUM 7.6*  --   --  7.5*   < > = values in this interval not displayed.   PT/INR No results for input(s): LABPROT, INR in the last 72 hours. ABG Recent Labs    03/07/21 1423  PHART 7.457*  HCO3 33.9*    Studies/Results: DG CHEST PORT 1 VIEW  Result Date: 03/07/2021 CLINICAL DATA:  Respiratory failure EXAM: PORTABLE CHEST 1 VIEW COMPARISON:  Previous studies including the examination of 03/03/2021 FINDINGS: Cardiac size is within normal limits. Tip of endotracheal tube is 2.7 cm above the carina. Enteric tube is noted traversing the esophagus. Tip of right IJ central venous catheter is seen in the superior vena cava. There  are patchy infiltrates in the right upper lung fields and left lung with interval improvement. No new focal infiltrates are seen. Left lateral CP angle is indistinct. There is no pneumothorax. IMPRESSION: There are patchy infiltrates in both lungs, more so on the left side with partial interval clearing, suggesting resolving multifocal pneumonia Electronically Signed   By: Elmer Picker M.D.   On: 03/07/2021 11:21   DG Abd Portable 1V  Result Date: 03/06/2021 CLINICAL DATA:  Evaluate NG tube placement EXAM: PORTABLE ABDOMEN - 1 VIEW COMPARISON:  03/02/2021 FINDINGS: Tip of enteric tube is seen in the stomach. Side-port in the enteric tube is at the gastroesophageal junction. Bowel gas pattern is nonspecific. No abnormal masses or calcifications are seen. Lower pelvis is not included in the image. Degenerative changes are noted in the lumbar spine. IMPRESSION: Tip of enteric tube is seen in the stomach. Side-port in the enteric tube is at the gastroesophageal junction. NG tube could be advanced 5 cm to place the side port within the stomach. Nonspecific bowel gas pattern. Electronically Signed   By: Elmer Picker M.D.   On: 03/06/2021 09:03   VAS [place] UPPER EXTREMITY VENOUS DUPLEX  Result Date: 03/07/2021 UPPER VENOUS STUDY  Patient Name:  CLINTEN HOWK  Date of Exam:   03/07/2021 Medical Rec #: [id number]     Accession #:    [id number] Date of Birth: 1945-05-17     Patient Gender: M Patient Age:   76 years Exam Location:  [facility] Procedure:      VAS [place] UPPER EXTREMITY VENOUS DUPLEX Referring Phys: DXIPJA CHAND --------------------------------------------------------------------------------  Indications: Swelling Limitations: Bandages, line and patient positioning, patient immobility, vent. Comparison Study: No prior studies. Performing Technologist: Oliver Hum RVT  Examination Guidelines: A complete evaluation includes B-mode imaging, spectral Doppler, color Doppler, and power Doppler as  needed of all accessible portions of each vessel. Bilateral testing is considered an integral part of a complete examination. Limited examinations for reoccurring indications may be performed as noted.  Right Findings: +----------+------------+---------+-----------+----------+-------+  RIGHT      Compressible Phasicity Spontaneous Properties Summary  +----------+------------+---------+-----------+----------+-------+  Subclavian     Full        Yes        Yes                         +----------+------------+---------+-----------+----------+-------+  Left Findings: +----------+------------+---------+-----------+----------+-------+  LEFT       Compressible Phasicity Spontaneous Properties Summary  +----------+------------+---------+-----------+----------+-------+  IJV            Full        Yes        Yes                         +----------+------------+---------+-----------+----------+-------+  Subclavian     Full        Yes        Yes                         +----------+------------+---------+-----------+----------+-------+  Axillary       Full        Yes        Yes                         +----------+------------+---------+-----------+----------+-------+  Brachial       Full        Yes        Yes                         +----------+------------+---------+-----------+----------+-------+  Radial         Full                                               +----------+------------+---------+-----------+----------+-------+  Ulnar          Full                                               +----------+------------+---------+-----------+----------+-------+  Cephalic       Full                                               +----------+------------+---------+-----------+----------+-------+  Basilic  Full                                               +----------+------------+---------+-----------+----------+-------+  Summary:  Right: No evidence of thrombosis in the subclavian.  Left: No evidence of deep vein thrombosis  in the upper extremity. No evidence of superficial vein thrombosis in the upper extremity.  *See table(s) above for measurements and observations.  Diagnosing physician: Deitra Mayo MD Electronically signed by Deitra Mayo MD on 03/07/2021 at 1:49:52 PM.    Final     Anti-infectives: Anti-infectives (From admission, onward)    Start     Dose/Rate Route Frequency Ordered Stop   03/06/21 1000  meropenem (MERREM) 1 g in sodium chloride 0.9 % 100 mL IVPB        1 g 200 mL/hr over 30 Minutes Intravenous Every 12 hours 03/06/21 0756     03/05/21 1400  piperacillin-tazobactam (ZOSYN) IVPB 3.375 g  Status:  Discontinued        3.375 g 12.5 mL/hr over 240 Minutes Intravenous Every 8 hours 03/05/21 0855 03/05/21 1137   03/05/21 1300  meropenem (MERREM) 500 mg in sodium chloride 0.9 % 100 mL IVPB  Status:  Discontinued        500 mg 200 mL/hr over 30 Minutes Intravenous Every 12 hours 03/05/21 1205 03/06/21 0756   03/02/21 1600  piperacillin-tazobactam (ZOSYN) IVPB 2.25 g  Status:  Discontinued        2.25 g 100 mL/hr over 30 Minutes Intravenous Every 8 hours 03/02/21 0720 03/05/21 0855   03/02/21 0730  piperacillin-tazobactam (ZOSYN) IVPB 3.375 g        3.375 g 100 mL/hr over 30 Minutes Intravenous STAT 03/02/21 0720 03/02/21 0809   03/02/21 0715  piperacillin-tazobactam (ZOSYN) IVPB 3.375 g  Status:  Discontinued        3.375 g 12.5 mL/hr over 240 Minutes Intravenous STAT 03/02/21 0711 03/02/21 0719   03/02/21 0515  ceFEPIme (MAXIPIME) 2 g in sodium chloride 0.9 % 100 mL IVPB        2 g 200 mL/hr over 30 Minutes Intravenous  Once 03/02/21 0502 03/02/21 0658   03/02/21 0515  metroNIDAZOLE (FLAGYL) IVPB 500 mg  Status:  Discontinued        500 mg 100 mL/hr over 60 Minutes Intravenous  Once 03/02/21 0502 03/02/21 0702   03/02/21 0515  vancomycin (VANCOCIN) IVPB 1000 mg/200 mL premix  Status:  Discontinued        1,000 mg 200 mL/hr over 60 Minutes Intravenous  Once 03/02/21 0502  03/02/21 0510   03/02/21 0515  vancomycin (VANCOREADY) IVPB 1500 mg/300 mL        1,500 mg 150 mL/hr over 120 Minutes Intravenous  Once 03/02/21 0510 03/02/21 1033       Assessment/Plan: POD 1 open LIH repair- MW after SBO/aspiration event FEN - NPO, NGT, IVF-he can start getting tube feeds from my standpoint or clears if he is extubated and passes for that VTE - SCDs, okay for chemical prophylaxis from our standpoint ID - Zosyn, wbc a little lower today Foley - In place ,cr continues to improve Shock - resolved Acute resp failure - On vent, per CCM Multifocal PNA - on abx. Per CCM Acute urinary retention - s/p foley placement by urology 3/2. HTN COPD Hx CVA Hx bladder CA    Rolm Bookbinder 03/08/2021

## 2021-03-08 NOTE — Progress Notes (Signed)
[facility] ADULT ICU REPLACEMENT PROTOCOL ? ? ?The patient does apply for the [facility] Adult ICU Electrolyte Replacment Protocol based on the criteria listed below:  ? ?1.Exclusion criteria: TCTS patients, ECMO patients, and Dialysis patients ?2. Is GFR >/= 30 ml/min? Yes.    ?Patient's GFR today is 46 ?3. Is SCr </= 2? Yes.   ?Patient's SCr is 1.54 mg/dL ?4. Did SCr increase >/= 0.5 in 24 hours? No. ?5.Pt's weight >40kg  Yes.   ?6. Abnormal electrolyte(s): K 3.3  ?7. Electrolytes replaced per protocol ? ? ?Christeen Douglas 03/08/2021 5:52 AM  ?

## 2021-03-08 NOTE — Progress Notes (Signed)
? ?Jason [name], MRN:  [id number], DOB:  Mar 25, 1945, LOS: 6 ?ADMISSION DATE:  03/02/2021, CONSULTATION DATE:  03/08/2021 ?REFERRING MD:  [name], MD CHIEF COMPLAINT:  SOB  ? ?History of Present Illness:  ?Pt is a 76 yo m w/ PMhx of dementia, bladder cancer presenting from SNF for SOB and emesis since early Tuesday AM. On arrival he was found to be somnolent w/ rhonchus breathing, sats in low 70s. Immediately intubated by ED MD and found feculent emesis throughout oropharynx and airway. Upon OGT placement had ~ 1L of immediate output. Per reports pt had associated tachypnea, fever and emesis since early Tuesday AM as well. No family present.  ? ?CT ab/pelvis on my review concerning for pneumatosis & SBO involving left inguinal & scrotum. Final read pending. ED MD was able to reduce palpable anterior abdominal wall portion however scrotum remains full. ED nursing unable to pass foley despite multiple attempts.  ? ?Pertinent  Medical History  ?Dementia  ?? Hx of Bladder cancer  ? ?Significant Hospital Events: ?Including procedures, antibiotic start and stop dates in addition to other pertinent events   ?3/2: admission for septic shock; central line and a-line placed ?3/3: decreased pressor and vent requirement. Echo with normal EF; G2DD. Moderate RV enlargement; P/F 153 ?3/4: off levo; on vaso alone. P/F 186 ?3/5: off pressors ?3/6: ESBL e.coli on UC. Transitioned to [facility].  ?3/7: inguinal hernia repair, no immediate complications. LUE duplex negative for DVT. CXR>improving infiltrates ?Interim History / Subjective:  ? ?No significant overnight events.  ?Tmax overnight 100.4, improved from yesterday. ? ?24h UOP 1.0L.  ?No significant NG output ?No BM ? ?Labs reviewed:  ?WBC 11.5>15.3>14. Hemoglobin 10.1 ?Sodium 149>147; Creatinine 2.65>>1.54; K 3.3 ?Alk phos [phone] ? ?3/2 BC: NG at 5d ?3/2 UC: 100k colonies of e.coli ESBL ?3/6 respiratory culture: rare klebsiella pneumoniae ? ?3/7 LUE venous duplex  negative for DVT ?3/7 CXR: improving infiltrates ? ?Objective   ?Blood pressure 116/61, pulse 76, temperature 99.5 ?F (37.5 ?C), temperature source Esophageal, resp. rate (!) 24, height $RemoveBe'5\' 8"'LMZgggAfG$  (1.727 m), weight 74.6 kg, SpO2 99 %. ?   ?Vent Mode: PRVC ?FiO2 (%):  [40 %] 40 % ?Set Rate:  [24 bmp] 24 bmp ?Vt Set:  [410 mL] 410 mL ?PEEP:  [5 cmH20-8 cmH20] 5 cmH20 ?Plateau Pressure:  [15 cmH20-18 cmH20] 16 cmH20  ? ?Intake/Output Summary (Last 24 hours) at 03/08/2021 8338 ?Last data filed at 03/08/2021 0700 ?Gross per 24 hour  ?Intake 3221.64 ml  ?Output 1305 ml  ?Net 1916.64 ml  ? ? ?Filed Weights  ? 03/06/21 0354 03/07/21 0500 03/08/21 0500  ?Weight: 76.7 kg 74.5 kg 74.6 kg  ? ? ?Examination: ?General: critically ill appearing, intubated ?Cardiac: RRR, no LE edema. Chronic LUE edema ?Pulm: on full vent support. Mechanical breath sounds ?GI: hypoactive bowel sounds ?Skin: surgical incision in the left inguinal line. No surrounding erythema, edema or drainage.  ?Neuro: opens eyes to voice. Does not follow commands.  ? ?Resolved Hospital Problem list   ?Septic shock secondary to aspiration pna vs e.coli UTI ? ?Assessment & Plan:  ? ?Acute respiratory failure with hypoxia due to multifocal pneumonia requiring mechanical ventilation ?3/6 respiratory culture with rare klebsiella pneumoniae ?ESBL E.coli UTI ?-will hold sedation this morning and do SBT. More awake this morning so hopeful we are moving towards extubation in the next few days ?-Continue merrem ?-remove a-line  ?-remove central line ? ?SBO with incarcerated left inguinal hernia ?S/p hernia repair 3/7 ?Bowel  sounds are still hypoactive but seem improved today.  ?Moderate Malnutrition ?-Start TF today. We will see if he is able to be extubated today. If not, we can proceed with getting a cortrak placed.  ? 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 Continue  D5. ?-Catheter to remain in place per urology ? ?Transient atrial fibrillation.  ?Likely due to acute illness. Back in SR. CHADS2VASC 4.  ?-cont amio  ?-will need to start anticoagulation over the next few days  ? ?GERD ?-PPI ? ?Dementia  ?- Ambulates, AAOx3. Needs ADL assistance and minimally communicative   ?-Delirium precautions ? ?Moderate malnutrition ?-remains NPO due to ongoing SBO ?-no indication for TPN right now  ? ?Best Practice (right click and "Reselect all SmartList Selections" daily)  ? ?Diet/type: tubefeeds ?DVT prophylaxis: prophylactic heparin  ?GI prophylaxis: PPI ?Lines: No longer needed.  Order written to d/c  ?Foley:  Yes, and it is still needed ?Code Status:  full code ?Family updated 3/7. ? ?[name], MD ?Internal Medicine Resident PGY-3 ?[name] ?Pager: [phone] ?03/08/2021 8:12 AM  ?  ?

## 2021-03-08 NOTE — Progress Notes (Addendum)
Nutrition Follow-up ? ?DOCUMENTATION CODES:  ? ?Non-severe (moderate) malnutrition in context of chronic illness ? ?INTERVENTION:  ? ?Start trickle feeds per MD once cortrak is placed: ? ?Vital AF 1.2 @ 20 ml/hr via cortrak tube (480 ml daily) ? ?Tube feeding regimen provides 576 kcal, 36 grams of protein, and 389 ml of H2O.   ? ?When advance to advance feeds, recommend: ? ?Vital AF 1.2 @ 65 ml/hr (1560 ml daily) ? ?45 ml Prosource TF daily.   ? ?Tube feeding regimen provides 1912 kcal (100% of needs), 128 grams of protein, and 1265 ml of H2O.   ? ?NUTRITION DIAGNOSIS:  ? ?Moderate Malnutrition related to chronic illness (COPD) as evidenced by moderate fat depletion, moderate muscle depletion. ? ?Ongoing ? ?GOAL:  ? ?Patient will meet greater than or equal to 90% of their needs ? ?Progressing  ? ?MONITOR:  ? ?Vent status, Labs, Weight trends, TF tolerance, Skin, I & O's ? ?REASON FOR ASSESSMENT:  ? ?Ventilator ?  ? ?ASSESSMENT:  ? ?76 yo male admitted with SBO with incarcerated hernia. PMH includes dementia, bladder cancer, COPD, HTN, stroke. ? ?3/7- s/p Procedure: Left inguinal hernia repair with Ultrapro mesh ? ?Patient is currently intubated on ventilator support. OGT currently connected to low, intermittent suction.  ?MV: 10.4 L/min ?Temp (24hrs), Avg:99.6 ?F (37.6 ?C), Min:98.2 ?F (36.8 ?C), Max:100.8 ?F (38.2 ?C) ? ?Reviewed I/O's: +2 L x 24 hours and +4.6 L since admission ? ?UOP: 1.2 L x 24 hours ? ?OGT output: 60 ml x 24 hours ?  ?Case discussed with MD; surgery has given clearance to start feeds. Plan for trickle feeds today and will reassess tomorrow for potential advancement.  ? ?Plan for cortrak placement today. ? ?Medications reviewed and include dextrose 5% solution @ 50 ml/hr.  ? ?Labs reviewed: Na: 147, K: 3.3, CBGS: 68-151 (inpatient orders for glycemic control are 0-9 units insulin aspart every 4 hours). ? ?Diet Order:   ?Diet Order   ? ?       ?  Diet NPO time specified  Diet effective now        ?  ? ?  ?  ? ?  ? ? ?EDUCATION NEEDS:  ? ?No education needs have been identified at this time ? ?Skin:  Skin Assessment: Skin Integrity Issues: ?Skin Integrity Issues:: Incisions ?Incisions: closed lt abdomen ? ?Last BM:  Unknown ? ?Height:  ? ?Ht Readings from Last 1 Encounters:  ?03/02/21 '5\' 8"'$  (1.727 m)  ? ? ?Weight:  ? ?Wt Readings from Last 1 Encounters:  ?03/08/21 74.6 kg  ? ?BMI:  Body mass index is 25.01 kg/m?. ? ?Estimated Nutritional Needs:  ? ?Kcal:  1610 ? ?Protein:  125-150 grams ? ?Fluid:  > 1.9 L ? ? ? ?Loistine Chance, RD, LDN, CDCES ?Registered Dietitian II ?Certified Diabetes Care and Education Specialist ?Please refer to [facility] for RD and/or RD on-call/weekend/after hours pager  ?

## 2021-03-08 NOTE — Procedures (Signed)
Extubation Procedure Note ? ?Patient Details:   ?Name: Jason [name] ?DOB: August 27, 1945 ?MRN: [id number] ?  ?Airway Documentation:  ?  ?Vent end date: 03/08/21 Vent end time: 1030  ? ?Evaluation ? O2 sats: stable throughout ?Complications: No apparent complications ?Patient did tolerate procedure well. ?Bilateral Breath Sounds: Clear, Diminished ?  ?Yes ? ?Patient extubated at this time to 4L [place]. Patient able to speak. Patient had a positive cuff leak prior to extubation. No stridor noted at this time.  ? ?[place] ?03/08/2021, 10:34 AM ? ?

## 2021-03-09 DIAGNOSIS — A419 Sepsis, unspecified organism: Secondary | ICD-10-CM | POA: Diagnosis not present

## 2021-03-09 DIAGNOSIS — K56609 Unspecified intestinal obstruction, unspecified as to partial versus complete obstruction: Secondary | ICD-10-CM | POA: Diagnosis not present

## 2021-03-09 DIAGNOSIS — N179 Acute kidney failure, unspecified: Secondary | ICD-10-CM | POA: Diagnosis not present

## 2021-03-09 DIAGNOSIS — J690 Pneumonitis due to inhalation of food and vomit: Secondary | ICD-10-CM | POA: Diagnosis not present

## 2021-03-09 LAB — BASIC METABOLIC PANEL
Anion gap: 7 (ref 5–15)
BUN: 30 mg/dL — ABNORMAL HIGH (ref 8–23)
CO2: 30 mmol/L (ref 22–32)
Calcium: 7.5 mg/dL — ABNORMAL LOW (ref 8.9–10.3)
Chloride: 107 mmol/L (ref 98–111)
Creatinine, Ser: 1.53 mg/dL — ABNORMAL HIGH (ref 0.61–1.24)
GFR, Estimated: 47 mL/min — ABNORMAL LOW (ref >60)
Glucose, Bld: 152 mg/dL — ABNORMAL HIGH (ref 70–99)
Potassium: 3.2 mmol/L — ABNORMAL LOW (ref 3.5–5.1)
Sodium: 144 mmol/L (ref 135–145)

## 2021-03-09 LAB — CBC
HCT: 32.9 % — ABNORMAL LOW (ref 39.0–52.0)
Hemoglobin: 10.4 g/dL — ABNORMAL LOW (ref 13.0–17.0)
MCH: 22.2 pg — ABNORMAL LOW (ref 26.0–34.0)
MCHC: 31.6 g/dL (ref 30.0–36.0)
MCV: 70.1 fL — ABNORMAL LOW (ref 80.0–100.0)
Platelets: 220 10*3/uL (ref 150–400)
RBC: 4.69 MIL/uL (ref 4.22–5.81)
RDW: 15.2 % (ref 11.5–15.5)
WBC: 13.9 10*3/uL — ABNORMAL HIGH (ref 4.0–10.5)
nRBC: 0 % (ref 0.0–0.2)

## 2021-03-09 LAB — GLUCOSE, CAPILLARY
Glucose-Capillary: 130 mg/dL — ABNORMAL HIGH (ref 70–99)
Glucose-Capillary: 136 mg/dL — ABNORMAL HIGH (ref 70–99)
Glucose-Capillary: 156 mg/dL — ABNORMAL HIGH (ref 70–99)
Glucose-Capillary: 99 mg/dL (ref 70–99)
Glucose-Capillary: 78 mg/dL (ref 70–99)
Glucose-Capillary: 146 mg/dL — ABNORMAL HIGH (ref 70–99)

## 2021-03-09 LAB — PHOSPHORUS
Phosphorus: 1.9 mg/dL — ABNORMAL LOW (ref 2.5–4.6)
Phosphorus: 2.8 mg/dL (ref 2.5–4.6)

## 2021-03-09 LAB — MAGNESIUM
Magnesium: 2.1 mg/dL (ref 1.7–2.4)
Magnesium: 2 mg/dL (ref 1.7–2.4)

## 2021-03-09 MED ORDER — SODIUM PHOSPHATES 45 MMOLE/15ML IV SOLN
15.0000 mmol | Freq: Once | INTRAVENOUS | Status: AC
  Administered 2021-03-09: 09:00:00 15 mmol via INTRAVENOUS
  Filled 2021-03-09: qty 5

## 2021-03-09 MED ORDER — POTASSIUM CHLORIDE 10 MEQ/50ML IV SOLN
10.0000 meq | INTRAVENOUS | Status: AC
  Administered 2021-03-09 (×3): 10 meq via INTRAVENOUS
  Filled 2021-03-09 (×3): qty 50

## 2021-03-09 MED ORDER — PANTOPRAZOLE 2 MG/ML SUSPENSION
40.0000 mg | Freq: Every day | ORAL | Status: DC
  Administered 2021-03-09: 22:00:00 40 mg
  Filled 2021-03-09: qty 20

## 2021-03-09 MED ORDER — AMIODARONE HCL 200 MG PO TABS
200.0000 mg | ORAL_TABLET | Freq: Every day | ORAL | Status: DC
  Administered 2021-03-09 – 2021-03-10 (×2): 200 mg via ORAL
  Filled 2021-03-09 (×2): qty 1

## 2021-03-09 MED ORDER — AMIODARONE IV BOLUS ONLY 150 MG/100ML
150.0000 mg | Freq: Once | INTRAVENOUS | Status: AC
  Administered 2021-03-09: 13:00:00 150 mg via INTRAVENOUS

## 2021-03-09 MED ORDER — SODIUM PHOSPHATES 45 MMOLE/15ML IV SOLN
15.0000 mmol | Freq: Once | INTRAVENOUS | Status: AC
  Administered 2021-03-09: 15:00:00 15 mmol via INTRAVENOUS
  Filled 2021-03-09: qty 5

## 2021-03-09 MED ORDER — POTASSIUM CHLORIDE 10 MEQ/50ML IV SOLN
10.0000 meq | INTRAVENOUS | Status: AC
  Administered 2021-03-09 (×4): 10 meq via INTRAVENOUS
  Filled 2021-03-09 (×4): qty 50

## 2021-03-09 MED ORDER — POTASSIUM CHLORIDE 20 MEQ PO PACK
20.0000 meq | PACK | ORAL | Status: AC
  Administered 2021-03-09 (×2): 20 meq
  Filled 2021-03-09 (×2): qty 1

## 2021-03-09 MED ORDER — VITAL AF 1.2 CAL PO LIQD
1000.0000 mL | ORAL | Status: DC
  Administered 2021-03-09: 16:00:00 1000 mL

## 2021-03-09 MED ORDER — DIGOXIN 0.25 MG/ML IJ SOLN: 0.2500 mg | INTRAMUSCULAR | Status: DC

## 2021-03-09 MED ORDER — FREE WATER
200.0000 mL | Freq: Four times a day (QID) | Status: DC
Start: 2021-03-09 — End: 2021-03-11
  Administered 2021-03-09 – 2021-03-11 (×8): 200 mL

## 2021-03-09 MED ORDER — ADENOSINE 6 MG/2ML IV SOLN
INTRAVENOUS | Status: AC
  Administered 2021-03-09: 13:00:00 6 mg
  Filled 2021-03-09: qty 2

## 2021-03-09 NOTE — Progress Notes (Signed)
? ?[name], MRN:  [id number], DOB:  [birth year], LOS: 7 ?ADMISSION DATE:  03/02/2021, CONSULTATION DATE:  03/09/2021 ?REFERRING MD:  [name], MD CHIEF COMPLAINT:  SOB  ? ?History of Present Illness:  ?Pt is a 76 yo m w/ PMhx of dementia, bladder cancer presenting from SNF for SOB and emesis since early Tuesday AM. On arrival he was found to be somnolent w/ rhonchus breathing, sats in low 70s. Immediately intubated by ED MD and found feculent emesis throughout oropharynx and airway. Upon OGT placement had ~ 1L of immediate output. Per reports pt had associated tachypnea, fever and emesis since early Tuesday AM as well. No family present.  ? ?CT ab/pelvis on my review concerning for pneumatosis & SBO involving left inguinal & scrotum. Final read pending. ED MD was able to reduce palpable anterior abdominal wall portion however scrotum remains full. ED nursing unable to pass foley despite multiple attempts.  ? ?Pertinent  Medical History  ?Dementia  ?? Hx of Bladder cancer  ? ?Significant Hospital Events: ?Including procedures, antibiotic start and stop dates in addition to other pertinent events   ?3/2: admission for septic shock; central line and a-line placed ?3/3: decreased pressor and vent requirement. Echo with normal EF; G2DD. Moderate RV enlargement; P/F 153 ?3/4: off levo; on vaso alone. P/F 186 ?3/5: off pressors ?3/6: ESBL e.coli on UC. Transitioned to [facility].  ?3/7: inguinal hernia repair, no immediate complications. LUE duplex negative for DVT. CXR>improving infiltrates ?3/8: extubated; feeds started, a-line removed ?Interim History / Subjective:  ? ?No significant overnight events.  ? ?No hypoglycemia.  ? ?Afebrile. VSS. ? ?24h UOP 1.4L.  ?Large BM this morning. ? ?Labs reviewed:  ?WBC stable at 14.  ?Sodium 149>147>144; Creatinine stable from yesterday at 1.5. K 3.2, phos 1.9 ? ?Objective   ?Blood pressure 139/73, pulse 83, temperature 98.4 ?F (36.9 ?C), temperature source Oral, resp. rate  (!) 32, height '5\' 8"'$  (1.727 m), weight 74.6 kg, SpO2 99 %. ?   ?FiO2 (%):  [30 %-40 %] 30 %  ? ?Intake/Output Summary (Last 24 hours) at 03/09/2021 1103 ?Last data filed at 03/09/2021 3734 ?Gross per 24 hour  ?Intake 2774.28 ml  ?Output 1265 ml  ?Net 1509.28 ml  ? ? ?Filed Weights  ? 03/06/21 0354 03/07/21 0500 03/08/21 0500  ?Weight: 76.7 kg 74.5 kg 74.6 kg  ? ? ?Examination: ?General: chronically ill appearing in no distress ?Cardiac: RRR, no LE edema. Chronic LUE edema. Capillary refill time of the right digits ~3 seconds ?Pulm: breathing comfortably on 40L HHF '@45'$ % FiO2 with O2 saturations 98%. Diffuse rales throughout.  ?GI: BS active. Non-tender to palpation.  ?Skin: surgical incision in the left inguinal line. No surrounding erythema, edema or drainage.  ?Neuro: awake. Oriented to person only. Follows commands. ? ?Resolved Hospital Problem list   ?Septic shock secondary to aspiration pna vs e.coli UTI ?Hypernatremia ? ?Assessment & Plan:  ? ?Acute respiratory failure with hypoxia due to multifocal pneumonia requiring mechanical ventilation ?3/6 respiratory culture with rare klebsiella pneumoniae ?ESBL E.coli UTI ?-tolerated O2 wean to 25L '@30'$ % FiO2 ?-continue to wean oxygen as able ?- pulm hygiene ?-Continue merrem (last dose 3/13) ? ?SBO with incarcerated left inguinal hernia ?S/p hernia repair 3/7 ?Moderate Malnutrition ?-Tolerating tube feeds well. Now passing stool. Surgery ok with increasing feeds.  ?-SLP eval ?-Surgery signed off  ?-Keep K 4-5, Mag >2. Replete electrolytes as needed. ? 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 Hypoglycemia resolved with TF initiation. Discontinue D5, start free water per tube '@200cc'$  q6h ?-Urinary Catheter to remain in place per urology ? ?Transient atrial fibrillation.  ?Likely due to acute illness. Back in SR. CHADS2VASC 4.  ?-cont amio. Transition to per tube ?-will need to  start anticoagulation over the next few days  ? ?GERD ?-PPI ? ?Dementia  ?- Ambulates, AAOx3. Needs ADL assistance and minimally communicative   ?-Delirium precautions ? ?Best Practice (right click and "Reselect all SmartList Selections" daily)  ? ?Diet/type: tubefeeds ?DVT prophylaxis: prophylactic heparin  ?GI prophylaxis: PPI ?Lines: No longer needed.  Order written to d/c  ?Foley:  Yes, and it is still needed ?Code Status:  full code ?Family updated 3/7. ? ?Jason Hansen, MD ?Internal Medicine Resident PGY-3 ?[name] ?Pager: [phone] ?03/09/2021 11:03 AM  ?  ?

## 2021-03-09 NOTE — Final Consult Note (Signed)
Consultant Final Sign-Off Note    Assessment/Final recommendations  Jason [name] is a 76 y.o. male followed by me for left inguinal hernia    POD 2 s/p open LIH repair - MW after SBO/aspiration event - Patient now extubated. Tolerating TF's and having bowel function. Can advance TF's as tolerated. If passes swallow, diet can be advanced as tolerated. Will arrange follow up. We will sign off. Call back with any questions or concerns.   Wound care (if applicable): okay to shower, let soapy water run over the incision. Do not submerge in water for 2 weeks.    Diet at discharge: per primary team   Activity at discharge:  Do not lift greater than 10lbs for 6 weeks   Follow-up appointment: Will arrange for DOW clinic    Pending results:  Unresulted Labs (From admission, onward)     Start     Ordered   03/09/21 0277  Basic metabolic panel  Daily,   R     Question:  Specimen collection method  Answer:  Unit=Unit collect   03/08/21 1421   03/09/21 0500  CBC  Daily,   R     Question:  Specimen collection method  Answer:  Unit=Unit collect   03/08/21 1421   03/08/21 0849  Magnesium  (ICU Tube Feeding: PEPuP )  5A & 5P,   R     Question:  Specimen collection method  Answer:  Unit=Unit collect   03/08/21 0847   03/08/21 0849  Phosphorus  (ICU Tube Feeding: PEPuP )  5A & 5P,   R     Question:  Specimen collection method  Answer:  Unit=Unit collect   03/08/21 0847             Medication recommendations: Multimodal pain control   Other recommendations: Adv TF's as tolerated. If passes swallow, diet can be advanced as tolerated.     Thank you for allowing [place] to participate in the care of your patient!  Please consult [place] again if you have further needs for your patient.  Barth Kirks [facility] 03/09/2021 7:33 AM    Subjective   Extubated yesterday. Tolerating tf's without n/v. Passing flatus. Just had a BM.   Objective  Vital signs in last 24 hours: Temp:  [98.1 F (36.7 C)-99.5 F  (37.5 C)] 98.3 F (36.8 C) (03/09 0300) Pulse Rate:  [78-115] 84 (03/09 0400) Resp:  [17-44] 32 (03/09 0400) BP: (115-146)/(56-82) 139/71 (03/09 0400) SpO2:  [91 %-100 %] 94 % (03/09 0400) FiO2 (%):  [40 %] 40 % (03/09 0400)  General: Awake and alert, NAD Abd: Soft, ND, NT except with appropriate tenderness near his L inguinal incision. L inguinal incision c/d/I with glue in place - no recurrence noted. TF's running.    Pertinent labs and Studies: Recent Labs    03/07/21 0542 03/07/21 1423 03/07/21 1433 03/08/21 0500 03/09/21 0118  WBC 15.3*  --   --  14.3* 13.9*  HGB 10.7*   < > 13.6 10.1* 10.4*  HCT 33.8*   < > 40.0 32.1* 32.9*   < > = values in this interval not displayed.   BMET Recent Labs    03/08/21 0500 03/09/21 0118  NA 147* 144  K 3.3* 3.2*  CL 108 107  CO2 31 30  GLUCOSE 155* 152*  BUN 38* 30*  CREATININE 1.54* 1.53*  CALCIUM 7.5* 7.5*   No results for input(s): LABURIN in the last 72 hours. Results for orders placed or performed during  the hospital encounter of 03/02/21  Resp Panel by RT-PCR (Flu A&B, Covid) Nasopharyngeal Swab     Status: None   Collection Time: 03/02/21  5:02 AM   Specimen: Nasopharyngeal Swab; Nasopharyngeal(NP) swabs in vial transport medium  Result Value Ref Range Status   SARS Coronavirus 2 by RT PCR NEGATIVE NEGATIVE Final    Comment: (NOTE) SARS-CoV-2 target nucleic acids are NOT DETECTED.  The SARS-CoV-2 RNA is generally detectable in upper respiratory specimens during the acute phase of infection. The lowest concentration of SARS-CoV-2 viral copies this assay can detect is 138 copies/mL. A negative result does not preclude SARS-Cov-2 infection and should not be used as the sole basis for treatment or other patient management decisions. A negative result may occur with  improper specimen collection/handling, submission of specimen other than nasopharyngeal swab, presence of viral mutation(s) within the areas targeted by  this assay, and inadequate number of viral copies(<138 copies/mL). A negative result must be combined with clinical observations, patient history, and epidemiological information. The expected result is Negative.  Fact Sheet for Patients:  EntrepreneurPulse.com.au  Fact Sheet for Healthcare Providers:  IncredibleEmployment.be  This test is no t yet approved or cleared by the [place] FDA and  has been authorized for detection and/or diagnosis of SARS-CoV-2 by FDA under an Emergency Use Authorization (EUA). This EUA will remain  in effect (meaning this test can be used) for the duration of the COVID-19 declaration under Section 564(b)(1) of the Act, 21 U.S.C.section 360bbb-3(b)(1), unless the authorization is terminated  or revoked sooner.       Influenza A by PCR NEGATIVE NEGATIVE Final   Influenza B by PCR NEGATIVE NEGATIVE Final    Comment: (NOTE) The Xpert Xpress SARS-CoV-2/FLU/RSV plus assay is intended as an aid in the diagnosis of influenza from Nasopharyngeal swab specimens and should not be used as a sole basis for treatment. Nasal washings and aspirates are unacceptable for Xpert Xpress SARS-CoV-2/FLU/RSV testing.  Fact Sheet for Patients: EntrepreneurPulse.com.au  Fact Sheet for Healthcare Providers: IncredibleEmployment.be  This test is not yet approved or cleared by the [place] FDA and has been authorized for detection and/or diagnosis of SARS-CoV-2 by FDA under an Emergency Use Authorization (EUA). This EUA will remain in effect (meaning this test can be used) for the duration of the COVID-19 declaration under Section 564(b)(1) of the Act, 21 U.S.C. section 360bbb-3(b)(1), unless the authorization is terminated or revoked.  Performed at [facility], [place] [address] [place], [place] [address]   Blood Culture (routine x 2)     Status: None   Collection Time: 03/02/21   5:02 AM   Specimen: BLOOD  Result Value Ref Range Status   Specimen Description BLOOD RIGHT ANTECUBITAL  Final   Special Requests   Final    BOTTLES DRAWN AEROBIC AND ANAEROBIC Blood Culture results may not be optimal due to an inadequate volume of blood received in culture bottles   Culture   Final    NO GROWTH 5 DAYS Performed at [facility], [place] [address] [place],  [address]    Report Status 03/07/2021 FINAL  Final  Blood Culture (routine x 2)     Status: None   Collection Time: 03/02/21  5:07 AM   Specimen: BLOOD RIGHT FOREARM  Result Value Ref Range Status   Specimen Description BLOOD RIGHT FOREARM  Final   Special Requests   Final    BOTTLES DRAWN AEROBIC AND ANAEROBIC Blood Culture results may not  be optimal due to an inadequate volume of blood received in culture bottles   Culture   Final    NO GROWTH 5 DAYS Performed at [facility], [place] [address], [place], [place] [address]    Report Status 03/07/2021 FINAL  Final  MRSA Next Gen by PCR, Nasal     Status: None   Collection Time: 03/02/21  8:47 AM   Specimen: Nasal Mucosa; Nasal Swab  Result Value Ref Range Status   MRSA by PCR Next Gen NOT DETECTED NOT DETECTED Final    Comment: (NOTE) The GeneXpert MRSA Assay (FDA approved for NASAL specimens only), is one component of a comprehensive MRSA colonization surveillance program. It is not intended to diagnose MRSA infection nor to guide or monitor treatment for MRSA infections. Test performance is not FDA approved in patients less than 60 years old. Performed at [facility], [place] [address] [place], [place] [address]   Urine Culture     Status: Abnormal   Collection Time: 03/02/21  7:16 PM   Specimen: In/Out Cath Urine  Result Value Ref Range Status   Specimen Description IN/OUT CATH URINE  Final   Special Requests   Final    Normal Performed at [facility], [place] [address], [place], [place] [address]    Culture (A)  Final    100  COLONIES/mL ESCHERICHIA COLI Confirmed Extended Spectrum Beta-Lactamase Producer (ESBL).  In bloodstream infections from ESBL organisms, carbapenems are preferred over piperacillin/tazobactam. They are shown to have a lower risk of mortality.    Report Status 03/05/2021 FINAL  Final   Organism ID, Bacteria ESCHERICHIA COLI (A)  Final      Susceptibility   Escherichia coli - MIC*    AMPICILLIN >=32 RESISTANT Resistant     CEFAZOLIN >=64 RESISTANT Resistant     CEFEPIME >=32 RESISTANT Resistant     CEFTRIAXONE >=64 RESISTANT Resistant     CIPROFLOXACIN >=4 RESISTANT Resistant     GENTAMICIN >=16 RESISTANT Resistant     IMIPENEM <=0.25 SENSITIVE Sensitive     NITROFURANTOIN <=16 SENSITIVE Sensitive     TRIMETH/SULFA <=20 SENSITIVE Sensitive     AMPICILLIN/SULBACTAM >=32 RESISTANT Resistant     PIP/TAZO 16 SENSITIVE Sensitive     * 100 COLONIES/mL ESCHERICHIA COLI  Culture, Respiratory w Gram Stain     Status: None   Collection Time: 03/06/21 11:26 AM   Specimen: Tracheal Aspirate; Respiratory  Result Value Ref Range Status   Specimen Description TRACHEAL ASPIRATE  Final   Special Requests NONE  Final   Gram Stain   Final    FEW WBC PRESENT,BOTH PMN AND MONONUCLEAR NO ORGANISMS SEEN Performed at [facility], [address] [place], [place] [address]    Culture RARE KLEBSIELLA PNEUMONIAE  Final   Report Status 03/08/2021 FINAL  Final   Organism ID, Bacteria KLEBSIELLA PNEUMONIAE  Final      Susceptibility   Klebsiella pneumoniae - MIC*    AMPICILLIN >=32 RESISTANT Resistant     CEFAZOLIN <=4 SENSITIVE Sensitive     CEFEPIME <=0.12 SENSITIVE Sensitive     CEFTAZIDIME <=1 SENSITIVE Sensitive     CEFTRIAXONE 0.5 SENSITIVE Sensitive     CIPROFLOXACIN <=0.25 SENSITIVE Sensitive     GENTAMICIN <=1 SENSITIVE Sensitive     IMIPENEM <=0.25 SENSITIVE Sensitive     TRIMETH/SULFA <=20 SENSITIVE Sensitive     AMPICILLIN/SULBACTAM >=32 RESISTANT Resistant     PIP/TAZO 16 SENSITIVE  Sensitive     *  RARE KLEBSIELLA PNEUMONIAE  Surgical pcr screen     Status: None   Collection Time: 03/06/21  3:15 PM   Specimen: Nasal Mucosa; Nasal Swab  Result Value Ref Range Status   MRSA, PCR NEGATIVE NEGATIVE Final   Staphylococcus aureus NEGATIVE NEGATIVE Final    Comment: (NOTE) The Xpert SA Assay (FDA approved for NASAL specimens in patients 53 years of age and older), is one component of a comprehensive surveillance program. It is not intended to diagnose infection nor to guide or monitor treatment. Performed at [facility], [place] [address] [place], [place] [address]     Imaging: DG Abd Portable 1V  Result Date: 03/08/2021 CLINICAL DATA:  Feeding tube placement EXAM: PORTABLE ABDOMEN - 1 VIEW COMPARISON:  Abdominal radiograph 03/06/2021 FINDINGS: Feeding tube tip overlies the distal stomach/proximal duodenum. No evidence of bowel obstruction. No acute osseous abnormality. IMPRESSION: Feeding tube tip overlies the distal stomach/proximal duodenum. Electronically Signed   By: Maurine Simmering M.D.   On: 03/08/2021 09:30

## 2021-03-09 NOTE — Discharge Instructions (Addendum)
[facility] _______[facility]  UMBILICAL OR INGUINAL HERNIA REPAIR: POST OP INSTRUCTIONS  Always review your discharge instruction sheet given to you by the facility where your surgery was performed. IF YOU HAVE DISABILITY OR FAMILY LEAVE FORMS, YOU MUST BRING THEM TO THE OFFICE FOR PROCESSING.   DO NOT GIVE THEM TO YOUR DOCTOR.  1. A  prescription for pain medication may be given to you upon discharge.  Take your pain medication as prescribed, if needed.  If narcotic pain medicine is not needed, then you may take acetaminophen (Tylenol) or ibuprofen (Advil) as needed. 2. Take your usually prescribed medications unless otherwise directed. If you need a refill on your pain medication, please contact your pharmacy.  They will contact our office to request authorization. Prescriptions will not be filled after 5 pm or on week-ends. 3. You should follow a light diet the first 24 hours after arrival home, such as soup and crackers, etc.  Be sure to include lots of fluids daily.  Resume your normal diet the day after surgery. 4.Most patients will experience some swelling and bruising around the umbilicus or in the groin and scrotum.  Ice packs and reclining will help.  Swelling and bruising can take several days to resolve.  6. It is common to experience some constipation if taking pain medication after surgery.  Increasing fluid intake and taking a stool softener (such as Colace) will usually help or prevent this problem from occurring.  A mild laxative (Milk of Magnesia or Miralax) should be taken according to package directions if there are no bowel movements after 48 hours. 7. Unless discharge instructions indicate otherwise, you may remove your bandages 24-48 hours after surgery, and you may shower at that time.  You may have steri-strips (small skin tapes) in place directly over the incision.  These strips should be left on the skin for 7-10 days.  If your surgeon used skin glue on the  incision, you may shower in 24 hours.  The glue will flake off over the next 2-3 weeks.  Any sutures or staples will be removed at the office during your follow-up visit. 8. ACTIVITIES:  You may resume regular (light) daily activities beginning the next day--such as daily self-care, walking, climbing stairs--gradually increasing activities as tolerated.  You may have sexual intercourse when it is comfortable.  Refrain from any heavy lifting or straining until approved by your doctor.  a.You may drive when you are no longer taking prescription pain medication, you can comfortably wear a seatbelt, and you can safely maneuver your car and apply brakes. b.RETURN TO WORK:   _____________________________________________  9.You should see your doctor in the office for a follow-up appointment approximately 2-3 weeks after your surgery.  Make sure that you call for this appointment within a day or two after you arrive home to insure a convenient appointment time. 10.OTHER INSTRUCTIONS: _________________________    _____________________________________  WHEN TO CALL YOUR DOCTOR: Fever over 101.0 Inability to urinate Nausea and/or vomiting Extreme swelling or bruising Continued bleeding from incision. Increased pain, redness, or drainage from the incision  The clinic staff is available to answer your questions during regular business hours.  Please don't hesitate to call and ask to speak to one of the nurses for clinical concerns.  If you have a medical emergency, go to the nearest emergency room or call 911.  A surgeon from [facility] is always on call at the hospital   [address], [address],   [place], [place]  [address] ?  [address] [place], [place], [place]   [address] [phone] ? [phone] ? FAX [phone] Web site: [email or web address]  Information on my medicine - ELIQUIS (apixaban)  Why was Eliquis prescribed for you? Eliquis was prescribed for you to  reduce the risk of a blood clot forming that can cause a stroke if you have a medical condition called atrial fibrillation (a type of irregular heartbeat).  What do You need to know about Eliquis ? Take your Eliquis TWICE DAILY - one tablet in the morning and one tablet in the evening with or without food. If you have difficulty swallowing the tablet whole please discuss with your pharmacist how to take the medication safely.  Take Eliquis exactly as prescribed by your doctor and DO NOT stop taking Eliquis without talking to the doctor who prescribed the medication.  Stopping may increase your risk of developing a stroke.  Refill your prescription before you run out.  After discharge, you should have regular check-up appointments with your healthcare provider that is prescribing your Eliquis.  In the future your dose may need to be changed if your kidney function or weight changes by a significant amount or as you get older.  What do you do if you miss a dose? If you miss a dose, take it as soon as you remember on the same day and resume taking twice daily.  Do not take more than one dose of ELIQUIS at the same time to make up a missed dose.  Important Safety Information A possible side effect of Eliquis is bleeding. You should call your healthcare provider right away if you experience any of the following: Bleeding from an injury or your nose that does not stop. Unusual colored urine (red or dark brown) or unusual colored stools (red or black). Unusual bruising for unknown reasons. A serious fall or if you hit your head (even if there is no bleeding).  Some medicines may interact with Eliquis and might increase your risk of bleeding or clotting while on Eliquis. To help avoid this, consult your healthcare provider or pharmacist prior to using any new prescription or non-prescription medications, including herbals, vitamins, non-steroidal anti-inflammatory drugs (NSAIDs) and  supplements.  This website has more information on Eliquis (apixaban): http://www.eliquis.com/eliquis/home

## 2021-03-09 NOTE — Evaluation (Signed)
Physical Therapy Evaluation ?Patient Details ?Name: Jason [name] ?MRN: [id number] ?DOB: 27-Dec-1945 ?Today's Date: 03/09/2021 ? ?History of Present Illness ? 76 yo m presenting from SNF 03/09/21 for SOB, fever and emesis since 03/07/21. On arrival he was found to be somnolent w/ rhonchus breathing, sats in low 70s. Immediately intubated by ED MD and found feculent emesis throughout oropharynx and airway. found to have incarcerated LIH with SBO and aspiration PNA s/p Left inguinal hernia repair with Ultrapro mesh Extubated 03/08/21 PMhx of dementia, bladder cancer  ?Clinical Impression ? Pt is from [employer]. He has dementia at baseline and is only oriented to his first name. Pt with limited and very delayed command follow today. Pt able to illicit movement in all 4 extremities and PROM is [facility]. Per RN he has history of hitting when he is moved too quickly. Deferred transfer to [place] today. PT recommending transfer back to [facility] when medically ready. PT will continue to follow acutely. ?   ? ?Recommendations for follow up therapy are one component of a multi-disciplinary discharge planning process, led by the attending physician.  Recommendations may be updated based on patient status, additional functional criteria and insurance authorization. ? ?Follow Up Recommendations Skilled nursing-short term rehab (<3 hours/day) (return to facility) ? ?  ?Assistance Recommended at Discharge Frequent or constant Supervision/Assistance  ?   ?   ?Recommendations for Other Services ? OT consult  ?  ?   ?Precautions / Restrictions Precautions ?Precautions: Fall ?Precaution Comments: [facility], [place], L inguinal hernia incision glued shut ?Restrictions ?Weight Bearing Restrictions: No  ? ?  ? ?Mobility ? Bed Mobility ?  ?  ?  ?  ?  ?  ?  ?General bed mobility comments: deferred due to decreased command function and hx of striking out when moved too quickly ?  ? ?  ?  ? ? ? ? ?Pertinent Vitals/Pain Pain Assessment ?Pain Assessment:  Faces ?Faces Pain Scale: Hurts a little bit ?Pain Location: generalized with PROM of extremities ?Pain Descriptors / Indicators: Grimacing, Guarding, Moaning ?Pain Intervention(s): Limited activity within patient's tolerance, Monitored during session, Repositioned  ? ? ?Home Living Family/patient expects to be discharged to:: Skilled nursing facility ?  ?  ?  ?  ?  ?  ?  ?  ?  ?   ?  ?Prior Function Prior Level of Function : Patient poor historian/Family not available ?  ?  ?  ?  ?  ?  ?  ?  ?  ? ? ?   ?Extremity/Trunk Assessment  ? Upper Extremity Assessment ?Upper Extremity Assessment: Generalized weakness;Difficult to assess due to impaired cognition ?  ? ?Lower Extremity Assessment ?Lower Extremity Assessment: Generalized weakness;RLE deficits/detail;LLE deficits/detail ?RLE Deficits / Details: PROM WFL, ?LLE Deficits / Details: PROM WFL ?  ? ?   ?Communication  ?    ?Cognition Arousal/Alertness: Lethargic ?Behavior During Therapy: Restless ?Overall Cognitive Status: History of cognitive impairments - at baseline ?  ?  ?  ?  ?  ?  ?  ?  ?  ?  ?  ?  ?  ?  ?  ?  ?General Comments: pt oriented only to his first name, limited command follow with very slow initiation, able to move all 4 extremities ?  ?  ? ?  ?General Comments General comments (skin integrity, edema, etc.): Pt on 30 L [facility] FiO2 30%O2, SpO2 94%O2, HR 82, BP 150/71 ? ?  ?   ? ?Assessment/Plan  ?  ?  PT Assessment Patient needs continued PT services  ?PT Problem List Decreased strength;Decreased activity tolerance;Decreased balance;Decreased mobility;Decreased coordination;Decreased cognition;Decreased safety awareness ? ?   ?  ?PT Treatment Interventions Functional mobility training;Therapeutic activities;Therapeutic exercise;Balance training;Cognitive remediation;Patient/family education   ? ?PT Goals (Current goals can be found in the Care Plan section)  ?Acute Rehab PT Goals ?Patient Stated Goal: none stated ?PT Goal Formulation: Patient unable to  participate in goal setting ?Time For Goal Achievement: 03/23/21 ?Potential to Achieve Goals: Fair ? ?  ?Frequency Min 2X/week ?  ? ? ?AM-PAC PT "6 Clicks" Mobility  ?Outcome Measure Help needed turning from your back to your side while in a flat bed without using bedrails?: Total ?Help needed moving from lying on your back to sitting on the side of a flat bed without using bedrails?: Total ?Help needed moving to and from a bed to a chair (including a wheelchair)?: Total ?Help needed standing up from a chair using your arms (e.g., wheelchair or bedside chair)?: Total ?Help needed to walk in hospital room?: Total ?Help needed climbing 3-5 steps with a railing? : Total ?6 Click Score: 6 ? ?  ?End of Session Equipment Utilized During Treatment: Oxygen ?Activity Tolerance: Patient limited by lethargy ?Patient left: in bed;with call bell/phone within reach;with bed alarm set ?Nurse Communication: Mobility status ?PT Visit Diagnosis: Muscle weakness (generalized) (M62.81) ?  ? ?Time: 0786-7544 ?PT Time Calculation (min) (ACUTE ONLY): 15 min ? ? ?Charges:   PT Evaluation ?$PT Eval Moderate Complexity: 1 Mod ?  ?  ?   ? ? ?Jason [name] PT, DPT ?Acute Rehabilitation Services ?Pager [phone] ?Office [phone] ? ? ?Bailey Mech Fleet ?03/09/2021, 10:55 AM ? ?

## 2021-03-09 NOTE — Progress Notes (Signed)
Pt NTS, moderate amount of secretions obtained, pt tolerated procedure well.  ?

## 2021-03-09 NOTE — Progress Notes (Signed)
[facility] ADULT ICU REPLACEMENT PROTOCOL ? ? ?The patient does apply for the [facility] Adult ICU Electrolyte Replacment Protocol based on the criteria listed below:  ? ?1.Exclusion criteria: TCTS patients, ECMO patients, and Dialysis patients ?2. Is GFR >/= 30 ml/min? Yes.    ?Patient's GFR today is 47 ?3. Is SCr </= 2? Yes.   ?Patient's SCr is 1.53 mg/dL ?4. Did SCr increase >/= 0.5 in 24 hours? No. ?5.Pt's weight >40kg  Yes.   ?6. Abnormal electrolyte(s): K+ 3.2  ?7. Electrolytes replaced per protocol ?8.  Call MD STAT for K+ </= 2.5, Phos </= 1, or Mag </= 1 ?Physician:  Dr Oletta Darter ? ?Sigmund Hazel 03/09/2021 1:51 AM  ?

## 2021-03-10 ENCOUNTER — Other Ambulatory Visit (HOSPITAL_COMMUNITY): Payer: Self-pay

## 2021-03-10 ENCOUNTER — Encounter (HOSPITAL_COMMUNITY)
Admission: EM | Disposition: A | Discharge status: Hospice | Payer: Self-pay | Source: Skilled Nursing Facility | Attending: Internal Medicine

## 2021-03-10 DIAGNOSIS — J9601 Acute respiratory failure with hypoxia: Secondary | ICD-10-CM

## 2021-03-10 DIAGNOSIS — J9602 Acute respiratory failure with hypercapnia: Secondary | ICD-10-CM

## 2021-03-10 LAB — BASIC METABOLIC PANEL
Anion gap: 11 (ref 5–15)
BUN: 26 mg/dL — ABNORMAL HIGH (ref 8–23)
CO2: 22 mmol/L (ref 22–32)
Calcium: 7.7 mg/dL — ABNORMAL LOW (ref 8.9–10.3)
Chloride: 109 mmol/L (ref 98–111)
Creatinine, Ser: 1.37 mg/dL — ABNORMAL HIGH (ref 0.61–1.24)
GFR, Estimated: 53 mL/min — ABNORMAL LOW (ref >60)
Glucose, Bld: 189 mg/dL — ABNORMAL HIGH (ref 70–99)
Potassium: 3.9 mmol/L (ref 3.5–5.1)
Sodium: 142 mmol/L (ref 135–145)

## 2021-03-10 LAB — GLUCOSE, CAPILLARY
Glucose-Capillary: 94 mg/dL (ref 70–99)
Glucose-Capillary: 197 mg/dL — ABNORMAL HIGH (ref 70–99)
Glucose-Capillary: 157 mg/dL — ABNORMAL HIGH (ref 70–99)
Glucose-Capillary: 169 mg/dL — ABNORMAL HIGH (ref 70–99)
Glucose-Capillary: 144 mg/dL — ABNORMAL HIGH (ref 70–99)
Glucose-Capillary: 156 mg/dL — ABNORMAL HIGH (ref 70–99)

## 2021-03-10 LAB — MAGNESIUM: Magnesium: 1.7 mg/dL (ref 1.7–2.4)

## 2021-03-10 LAB — CBC
HCT: 35.6 % — ABNORMAL LOW (ref 39.0–52.0)
Hemoglobin: 11.7 g/dL — ABNORMAL LOW (ref 13.0–17.0)
MCH: 22.4 pg — ABNORMAL LOW (ref 26.0–34.0)
MCHC: 32.9 g/dL (ref 30.0–36.0)
MCV: 68.1 fL — ABNORMAL LOW (ref 80.0–100.0)
Platelets: 244 10*3/uL (ref 150–400)
RBC: 5.23 MIL/uL (ref 4.22–5.81)
RDW: 14.8 % (ref 11.5–15.5)
WBC: 15.6 10*3/uL — ABNORMAL HIGH (ref 4.0–10.5)
nRBC: 0.1 % (ref 0.0–0.2)

## 2021-03-10 LAB — PHOSPHORUS: Phosphorus: 2.7 mg/dL (ref 2.5–4.6)

## 2021-03-10 SURGERY — REPAIR, HERNIA, INGUINAL, ADULT
Anesthesia: General | Laterality: Left

## 2021-03-10 MED ORDER — FAMOTIDINE 20 MG PO TABS
20.0000 mg | ORAL_TABLET | Freq: Every day | ORAL | Status: DC
  Administered 2021-03-10 – 2021-03-20 (×9): 20 mg
  Filled 2021-03-10 (×10): qty 1

## 2021-03-10 MED ORDER — SERTRALINE HCL 50 MG PO TABS
25.0000 mg | ORAL_TABLET | Freq: Every day | ORAL | Status: DC
  Administered 2021-03-10 – 2021-03-20 (×9): 25 mg
  Filled 2021-03-10 (×10): qty 1

## 2021-03-10 MED ORDER — ORAL CARE MOUTH RINSE
15.0000 mL | Freq: Two times a day (BID) | OROMUCOSAL | Status: DC
  Administered 2021-03-10 – 2021-03-20 (×16): 15 mL via OROMUCOSAL

## 2021-03-10 MED ORDER — PROSOURCE TF PO LIQD
45.0000 mL | Freq: Two times a day (BID) | ORAL | Status: DC
  Administered 2021-03-10 – 2021-03-13 (×8): 45 mL
  Filled 2021-03-10 (×9): qty 45

## 2021-03-10 MED ORDER — APIXABAN 5 MG PO TABS
5.0000 mg | ORAL_TABLET | Freq: Two times a day (BID) | ORAL | Status: DC
  Administered 2021-03-10 – 2021-03-13 (×7): 5 mg
  Filled 2021-03-10 (×7): qty 1

## 2021-03-10 MED ORDER — MAGNESIUM SULFATE 2 GM/50ML IV SOLN
2.0000 g | Freq: Once | INTRAVENOUS | Status: AC
  Administered 2021-03-10: 2 g via INTRAVENOUS
  Filled 2021-03-10: qty 50

## 2021-03-10 MED ORDER — GUAIFENESIN 100 MG/5ML PO LIQD
5.0000 mL | Freq: Four times a day (QID) | ORAL | Status: DC
  Administered 2021-03-10 – 2021-03-12 (×6): 5 mL
  Filled 2021-03-10 (×6): qty 5

## 2021-03-10 MED ORDER — AMIODARONE HCL 200 MG PO TABS
200.0000 mg | ORAL_TABLET | Freq: Every day | ORAL | Status: DC
  Administered 2021-03-11 – 2021-03-13 (×3): 200 mg
  Filled 2021-03-10 (×3): qty 1

## 2021-03-10 MED ORDER — HYDRALAZINE HCL 20 MG/ML IJ SOLN: 5.0000 mg | Freq: Four times a day (QID) | INTRAMUSCULAR | Status: DC | PRN

## 2021-03-10 MED ORDER — JEVITY 1.5 CAL/FIBER PO LIQD
1000.0000 mL | ORAL | Status: DC
Start: 2021-03-10 — End: 2021-03-14
  Administered 2021-03-10 – 2021-03-14 (×5): 1000 mL
  Filled 2021-03-10 (×8): qty 1000

## 2021-03-10 MED ORDER — GUAIFENESIN 100 MG/5ML PO LIQD: 5.0000 mL | Freq: Four times a day (QID) | ORAL | Status: DC

## 2021-03-10 NOTE — TOC Initial Note (Signed)
Transition of Care (TOC) - Initial/Assessment Note  ? ? ?Patient Details  ?Name: Jason [name] ?MRN: [id number] ?Date of Birth: 25-Mar-1945 ? ?Transition of Care [facility]) CM/SW Contact:    ?Jason Chars, LCSW ?Phone Number: ?03/10/2021, 1:29 PM ? ?Clinical Narrative:   Pt oriented x1 only.  CSW spoke with pt daughter Jason [name] by phone, confirmed pt is in long term care at [facility] for past 7-8 year.  Daughter would like pt to return.  Pt is active with [facility] and this has been a good service for him.  ? ?CSW LM with [facility] to confirm pt will return.   ? ?TOC will continue to follow for discharge needs.                ? ? ?Expected Discharge Plan: [place] ?Barriers to Discharge: Continued Medical Work up ? ? ?Patient Goals and CMS Choice ?  ?  ?Choice offered to / list presented to :  (NA-Pt is in long term care at [facility] and daughter would like for pt to return.) ? ?Expected Discharge Plan and Services ?Expected Discharge Plan: [place] ?In-house Referral: Clinical Social Work ?  ?Post Acute Care Choice: [place] ?Living arrangements for the past 2 months: [place] ?                ?  ?  ?  ?  ?  ?  ?  ?  ?  ?  ? ?Prior Living Arrangements/Services ?Living arrangements for the past 2 months: [place] ?Lives with:: Facility Resident ?Patient language and need for interpreter reviewed:: No ?       ?Need for Family Participation in Patient Care: Yes (Comment) ?Care giver support system in place?: Yes (comment) ?Current home services: Other (comment) (na) ?Criminal Activity/Legal Involvement Pertinent to Current Situation/Hospitalization: No - Comment as needed ? ?Activities of Daily Living ?  ?ADL Screening (condition at time of admission) ?Is the patient deaf or have difficulty hearing?: No ?Does the patient have difficulty seeing, even when wearing glasses/contacts?: No ?Does the patient have difficulty concentrating,  remembering, or making decisions?: Yes ?Does the patient have difficulty dressing or bathing?: No ?Does the patient have difficulty walking or climbing stairs?: No ? ?Permission Sought/Granted ?  ?  ?   ?   ?   ?   ? ?Emotional Assessment ?  ?  ?  ?Orientation: : Oriented to Self ?Alcohol / Substance Use: Not Applicable ?Psych Involvement: No (comment) ? ?Admission diagnosis:  Dehydration [E86.0] ?Small bowel obstruction ([place]) [K56.609] ?SBO (small bowel obstruction) ([place]) [K56.609] ?Hyperglycemia [R73.9] ?AKI (acute kidney injury) ([place]) [N17.9] ?Incarcerated hernia [K46.0] ?Aspiration pneumonia due to gastric secretions, unspecified laterality, unspecified part of lung ([place]) [J69.0] ?Sepsis, due to unspecified organism, unspecified whether acute organ dysfunction present ([place]) [A41.9] ?Vomiting, unspecified vomiting type, unspecified whether nausea present [R11.10] ?Patient Active Problem List  ? Diagnosis Date Noted  ? Malnutrition of moderate degree 03/03/2021  ? Incarcerated hernia   ? Pressure injury of skin 01/30/2018  ? Small bowel obstruction ([place]) 01/27/2018  ? Inguinal hernia 01/27/2018  ? Acute esophagitis 01/27/2018  ? Hematemesis 01/27/2018  ? Pneumonia 12/15/2015  ? Acute encephalopathy 03/07/2013  ? Postoperative intra-abdominal abscess 03/07/2013  ? Hydronephrosis, bilateral 03/07/2013  ? Pressure ulcer, stage YIF(027.74) 03/07/2013  ? UTI (lower urinary tract infection) 02/18/2013  ? Septic shock ([place]) 02/18/2013  ? Aspiration pneumonia ([place]) 02/18/2013  ?  Acute respiratory failure with hypoxia ([place]) 02/18/2013  ? Acute renal failure ([place]) 02/18/2013  ? Dementia ([place]) 02/18/2013  ? Hematuria 02/18/2013  ? 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 Memory loss 03/27/2009  ? CELLULITIS, HAND, LEFT 03/23/2009  ? FATIGUE 03/15/2009  ? ALCOHOL ABUSE 01/30/2008  ? GOITER 10/25/2006  ? 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 HYPERTENSION 10/25/2006  ? COLONIC POLYPS, HX OF 10/25/2006  ? 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 ?PCP:  Janifer Adie, MD ?Pharmacy:  No Pharmacies Listed ? ? ? ?Social Determinants of Health (SDOH) Interventions ?  ? ?Readmission Risk Interventions ?No flowsheet data found. ? ? ?

## 2021-03-10 NOTE — Progress Notes (Signed)
eLink Physician-Brief Progress Note ?Patient Name: Jason [name] ?DOB: 04/30/1945 ?MRN: [id number] ? ? ?Date of Service ? 03/10/2021  ?HPI/Events of Note ? Hypertension  ?eICU Interventions ? PRN hydralazine   ? ? ? ?Intervention Category ?Major Interventions: Hypertension - evaluation and management ? ?Nafis Farnan G Anahy Esh ?03/10/2021, 5:36 AM ?

## 2021-03-10 NOTE — Progress Notes (Addendum)
Gen sx paged- stated hgb is stable and able to initiate anticoagulation ?Urology paged- will ask when and if foley can be removed. Reviewed 3/2 consult note per Dr. Junious Silk: Foley catheter until patient is extubated and able to express the painful inability to urinate if he should go into retention.  Ambulate as able to transfer to the toilet and that would be even better. ?Per Dr. Abner Greenspan will keep foley in until patient is more alert and back to baseline.  ?

## 2021-03-10 NOTE — Evaluation (Signed)
Occupational Therapy Evaluation ?Patient Details ?Name: Jason [name] ?MRN: [id number] ?DOB: October 12, 1945 ?Today's Date: 03/10/2021 ? ? ?History of Present Illness 76 yo m presenting from SNF 03/09/21 for SOB, fever and emesis since 03/07/21. On arrival he was found to be somnolent w/ rhonchus breathing, sats in low 70s. Immediately intubated by ED MD and found feculent emesis throughout oropharynx and airway. found to have incarcerated LIH with SBO and aspiration PNA s/p Left inguinal hernia repair with Ultrapro mesh Extubated 03/08/21 PMhx of dementia, bladder cancer  ? ?Clinical Impression ?  ?Pt admitted with the above diagnoses and presents with below problem list. Pt will benefit from continued acute OT to address the below listed deficits and maximize independence with basic ADLs prior to d/c back to SNF. Unclear baseline with ADLs as pt is unable to provide history and no family present on eval. Per chart review pt resides at [facility]. Pt currently needs up to total assist with ADLs, bed level at this time.  ? ?  ?   ? ?Recommendations for follow up therapy are one component of a multi-disciplinary discharge planning process, led by the attending physician.  Recommendations may be updated based on patient status, additional functional criteria and insurance authorization.  ? ?Follow Up Recommendations ? Skilled nursing-short term rehab (<3 hours/day)  ?  ?Assistance Recommended at Discharge Frequent or constant Supervision/Assistance  ?Patient can return home with the following   ? ?  ?Functional Status Assessment ? Patient has had a recent decline in their functional status and demonstrates the ability to make significant improvements in function in a reasonable and predictable amount of time.  ?Equipment Recommendations ? Other (comment) (defer to next venue)  ?  ?Recommendations for Other Services   ? ? ?  ?Precautions / Restrictions Precautions ?Precautions: Fall ?Precaution Comments: HHFNC, Cortrak, L inguinal hernia  incision glued shut ?Restrictions ?Weight Bearing Restrictions: No  ? ?  ? ?Mobility Bed Mobility ?  ?  ?  ?  ?  ?  ?  ?General bed mobility comments: deferred due to decreased command function and hx of striking out when moved too quickly ?  ? ?Transfers ?  ?  ?  ?  ?  ?  ?  ?  ?  ?  ?  ? ?  ?Balance   ?  ?  ?  ?  ?  ?  ?  ?  ?  ?  ?  ?  ?  ?  ?  ?  ?  ?  ?   ? ?ADL either performed or assessed with clinical judgement  ? ?ADL Overall ADL's : Needs assistance/impaired ?Eating/Feeding: Total assistance ?  ?Grooming: Total assistance ?  ?Upper Body Bathing: Total assistance ?  ?Lower Body Bathing: Total assistance ?  ?Upper Body Dressing : Total assistance ?  ?Lower Body Dressing: Total assistance ?  ?  ?  ?  ?  ?  ?  ?  ?General ADL Comments: acute deficits on chronic impaired cognition  limiting pt's ability to engage with ADLs  ? ? ? ?Vision   ?   ?   ?Perception   ?  ?Praxis   ?  ? ?Pertinent Vitals/Pain Pain Assessment ?Pain Assessment: Faces ?Faces Pain Scale: Hurts a little bit ?Pain Location: generalized with PROM of extremities ?Pain Descriptors / Indicators: Grimacing, Guarding ?Pain Intervention(s): Monitored during session, Limited activity within patient's tolerance  ? ? ? ?Hand Dominance   ?  ?Extremity/Trunk Assessment Upper Extremity Assessment ?Upper  Extremity Assessment: Generalized weakness;Difficult to assess due to impaired cognition (bilateral edema forearm and distal) ?  ?Lower Extremity Assessment ?Lower Extremity Assessment: Defer to PT evaluation ?  ?  ?  ?Communication   ?  ?Cognition Arousal/Alertness: Awake/alert, Lethargic ?Behavior During Therapy: Flat affect, Anxious ?Overall Cognitive Status: History of cognitive impairments - at baseline ?  ?  ?  ?  ?  ?  ?  ?  ?  ?  ?  ?  ?  ?  ?  ?  ?General Comments: Oriented to name. followed one step commands about 25% of the time. decreased intitation. ?  ?  ?General Comments  Pt on 30 L HHFNC. HR 85, SpO2 95 ? ?  ?Exercises   ?  ?Shoulder  Instructions    ? ? ?Home Living Family/patient expects to be discharged to:: Skilled nursing facility ?  ?  ?  ?  ?  ?  ?  ?  ?  ?  ?  ?  ?  ?  ?  ?  ?  ?  ? ?  ?Prior Functioning/Environment Prior Level of Function : Patient poor historian/Family not available ?  ?  ?  ?  ?  ?  ?  ?  ?  ? ?  ?  ?OT Problem List: Decreased strength;Decreased activity tolerance;Impaired balance (sitting and/or standing);Decreased cognition;Decreased safety awareness;Decreased knowledge of use of DME or AE;Decreased coordination;Decreased knowledge of precautions;Cardiopulmonary status limiting activity;Pain ?  ?   ?OT Treatment/Interventions: Self-care/ADL training;Therapeutic exercise;DME and/or AE instruction;Therapeutic activities;Cognitive remediation/compensation;Patient/family education;Balance training  ?  ?OT Goals(Current goals can be found in the care plan section) Acute Rehab OT Goals ?OT Goal Formulation: Patient unable to participate in goal setting ?Time For Goal Achievement: 03/24/21 ?Potential to Achieve Goals: Fair ?ADL Goals ?Pt Will Perform Grooming: with max assist;sitting;bed level ?Pt/caregiver will Perform Home Exercise Program: Both right and left upper extremity;With minimal assist ?Additional ADL Goal #1: Pt will complete bed mobility at max A +1 level to facilite pericare.  ?OT Frequency: Min 2X/week ?  ? ?Co-evaluation   ?  ?  ?  ?  ? ?  ?AM-PAC OT "6 Clicks" Daily Activity     ?Outcome Measure Help from another person eating meals?: Total ?Help from another person taking care of personal grooming?: Total ?Help from another person toileting, which includes using toliet, bedpan, or urinal?: Total ?Help from another person bathing (including washing, rinsing, drying)?: Total ?Help from another person to put on and taking off regular upper body clothing?: Total ?Help from another person to put on and taking off regular lower body clothing?: Total ?6 Click Score: 6 ?  ?End of Session Nurse Communication:  Mobility status ? ?Activity Tolerance: Patient limited by pain;Patient limited by fatigue;Patient limited by lethargy ?Patient left: in bed;with call bell/phone within reach;with SCD's reapplied ? ?OT Visit Diagnosis: Other abnormalities of gait and mobility (R26.89);Unsteadiness on feet (R26.81);Muscle weakness (generalized) (M62.81);Other symptoms and signs involving cognitive function;Pain  ?              ?Time: 2993-7169 ?OT Time Calculation (min): 15 min ?Charges:  OT General Charges ?$OT Visit: 1 Visit ?OT Evaluation ?$OT Eval Low Complexity: 1 Low ? ?Tyrone Schimke, OT ?Acute Rehabilitation Services ?Office: [phone] ? ? ?Tyrone Schimke H ?03/10/2021, 12:17 PM ?

## 2021-03-10 NOTE — Progress Notes (Addendum)
eLink Physician-Brief Progress Note ?Patient Name: Jason [name] ?DOB: 1945-10-06 ?MRN: [id number] ? ? ?Date of Service ? 03/10/2021  ?HPI/Events of Note ? Received request for chest PT.  ? ?Pt with multifocal pneumonia and has a weak cough.  Pt has NT suction orders in place.   ?eICU Interventions ? Start chest PT.  ?Guaifenesin added.  ? ? ? ?Intervention Category ?Minor Interventions: Other: ? ?Elsie Lincoln ?03/10/2021, 8:40 PM ?

## 2021-03-10 NOTE — Progress Notes (Addendum)
? ?NAMEESLEY BROOKING, MRN:  [id number], DOB:  March 05, 1945, LOS: 8 ?ADMISSION DATE:  03/02/2021, CONSULTATION DATE:  03/10/2021 ?REFERRING MD:  [name], MD CHIEF COMPLAINT:  SOB  ? ?History of Present Illness:  ?Pt is a 76 yo m w/ PMhx of dementia, bladder cancer presenting from SNF for SOB and emesis since early Tuesday AM. On arrival he was found to be somnolent w/ rhonchus breathing, sats in low 70s. Immediately intubated by ED MD and found feculent emesis throughout oropharynx and airway. Upon OGT placement had ~ 1L of immediate output. Per reports pt had associated tachypnea, fever and emesis since early Tuesday AM as well. No family present.  ? ?CT ab/pelvis on my review concerning for pneumatosis & SBO involving left inguinal & scrotum. Final read pending. ED MD was able to reduce palpable anterior abdominal wall portion however scrotum remains full. ED nursing unable to pass foley despite multiple attempts.  ? ?Pertinent  Medical History  ?Dementia  ?? Hx of Bladder cancer  ? ?Significant Hospital Events: ?Including procedures, antibiotic start and stop dates in addition to other pertinent events   ?3/2: admission for septic shock; central line and a-line placed ?3/3: decreased pressor and vent requirement. Echo with normal EF; G2DD. Moderate RV enlargement; P/F 153 ?3/4: off levo; on vaso alone. P/F 186 ?3/5: off pressors ?3/6: ESBL e.coli on UC. Transitioned to [facility].  ?3/7: inguinal hernia repair, no immediate complications. LUE duplex negative for DVT. CXR>improving infiltrates ?3/8: extubated; feeds started, a-line removed ?3/9: Surgery signed off ?Interim History / Subjective:  ? ?No significant overnight events.  ? ?Objective   ?Blood pressure (!) 159/72, pulse 83, temperature 98.4 ?F (36.9 ?C), temperature source Oral, resp. rate (!) 32, height '5\' 8"'$  (1.727 m), weight 75.5 kg, SpO2 96 %. ?   ?FiO2 (%):  [30 %] 30 %  ? ?Intake/Output Summary (Last 24 hours) at 03/10/2021 0833 ?Last data filed at  03/10/2021 4481 ?Gross per 24 hour  ?Intake 2461.51 ml  ?Output 1661 ml  ?Net 800.51 ml  ? ? ?Filed Weights  ? 03/07/21 0500 03/08/21 0500 03/10/21 0400  ?Weight: 74.5 kg 74.6 kg 75.5 kg  ? ? ?Examination: ?General: Chronically ill, no acute distress. Age appropriate. ?Cardiac: RRR, normal heart sounds, no murmurs ?Respiratory: Coarse breath sounds bilaterally, normal effort ?Abdomen: soft, nontender, nondistended ?Extremities: No LE edema or cyanosis. ?Skin: Warm and dry, heel dressing bilaterally ?Neuro: alert and oriented to self ? ?Resolved Hospital Problem list   ?Septic shock secondary to aspiration pna vs e.coli UTI ?Hypernatremia ? ?Assessment & Plan:  ? ?Acute respiratory failure with hypoxia due to multifocal pneumonia requiring mechanical ventilation ?3/6 respiratory culture with rare klebsiella pneumoniae ?ESBL E.coli UTI ?O2 30L '@30'$ % FiO2 ?-continue to wean oxygen as able ?- pulm hygiene ?-Continue merrem (last dose 3/13) ? ?SBO with incarcerated left inguinal hernia ?S/p hernia repair 3/7 ?Moderate Malnutrition ?-Tolerating tube feeds well. Having stool output ?-SLP eval ?-Keep K 4-5, Mag >2. Replete electrolytes as needed. ? 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  ?-Urinary Catheter to remain in place per urology; discuss when to remove ? ?Transient atrial fibrillation.  ?Likely due to acute illness. Still in SR. CHADS2VASC 4.  ?-cont amio. Transition to per tube ?-Consider need to start anticoagulation over the next few days; talk with surgery today ? ?GERD ?-PPI ? ?Dementia  ?At baseline ambulates, AAOx3. Needs ADL assistance and minimally  communicative   ?-Delirium precautions ?-PT/OT eval ? ?Best Practice (right click and "Reselect all SmartList Selections" daily)  ? ?Diet/type: tubefeeds ?DVT prophylaxis: prophylactic heparin  ?GI prophylaxis: PPI ?Lines: No longer needed.  Order written to d/c   ?Foley:  Yes, and it is still needed ?Code Status:  full code ?Family updated 3/7. ? ?Gerlene Fee, DO ?03/10/2021, 8:38 AM ?PGY-3, [employer] ? ? ? ? ?Critical care attending attestation note: ?I agree with Autry-Lott, Naaman Plummer, DO note, impression, and recommendations as outlined. I have taken an independent interval history, reviewed the chart and examined the patient. The following reflects my medical decision making and independent critical care time ? ? ?Synopsis of assessment and plan: ?76 year old male here with acute hypoxic respiratory failure due to multifocal pneumonia in the setting of SBO leading to aspiration ? ?Patient remains on high flow nasal cannula oxygen, no overnight issues ?Yesterday he went into A-fib with RVR, requiring IV Amio bolus ?  ?Physical exam: ?General: Acute on chronically ill-appearing male, on heated high flow ?HEENT: [place]/AT, eyes anicteric.  Cortrak in place ?Neuro: Awake, following commands, is weak left upper extremity ?Chest: Coarse breath sounds, no wheezes or rhonchi ?Heart: Regular rate and rhythm, no murmurs or gallops ?Abdomen: Soft, nontender, nondistended, bowel sounds present ?Skin: No rash ? ?Labs and images were reviewed ? ?Assessment and plan: ?Acute hypoxic/hypercapnic respiratory failure due to multifocal pneumonia with Klebsiella ?ESBL E. coli UTI ?Sepsis with septic shock, resolved ?Acute metabolic/septic encephalopathy, improved ?AKI due to ischemic ATN, resolved ?Paroxysmal A-fib ?Moderate protein calorie malnutrition ?Hypernatremia/hypokalemia/hypophosphatemia/hypocalcemia ?Shock liver, resolved ?SBO due to incarcerated inguinal hernia s/p repair ? ?Continue titrate FiO2 on high flow nasal cannula oxygen with O2 sat goal 92% ?Currently on 20 L and 30% FiO2 ?Continue IV meropenem to complete 7 days therapy ?Mental status has improved ?Serum creatinine continue to trend down ?Again converted back to sinus rhythm ?Continue p.o.  amiodarone ?Will ask general surgery if he is cleared to be on Eliquis for stroke prophylaxis ?Continue aggressive electrolyte supplement ?Continue dietary supplement ? ? ?[name] MD ?[facility] ?See Amion for pager ?If no response to pager, please call [phone] until 7pm ?After 7pm, Please call E-link [phone] ? ?03/10/2021, 10:39 AM ? ? ? ?

## 2021-03-10 NOTE — Progress Notes (Signed)
Pt transitioned off of Heated High Flow nasal cannula and placed on a Salter High Flow nasal cannula. Pt tolerated well, RN updated. RT will continue to monitor and be available as needed.  ?

## 2021-03-10 NOTE — Progress Notes (Signed)
Provided video call with family.  ?

## 2021-03-10 NOTE — Progress Notes (Addendum)
Nutrition Follow-up ? ?DOCUMENTATION CODES:  ? ?Non-severe (moderate) malnutrition in context of chronic illness ? ?INTERVENTION:  ? ?Change TF to Jevity 1.5 at 55 ml/h via Cortrak. ?Prosource TF 45 ml BID. ? ?Provides 2060 kcal, 106 gm protein, 1003 ml free water daily. ? ?Free water 200 ml every 6 hours for a total of 1803 ml free water daily. ? ?NUTRITION DIAGNOSIS:  ? ?Moderate Malnutrition related to chronic illness (COPD) as evidenced by moderate fat depletion, moderate muscle depletion. ? ?Ongoing  ? ?GOAL:  ? ?Patient will meet greater than or equal to 90% of their needs ? ?Met with TF ? ?MONITOR:  ? ?Vent status, Labs, Weight trends, TF tolerance, Skin, I & O's ? ?REASON FOR ASSESSMENT:  ? ?Ventilator ?  ? ?ASSESSMENT:  ? ?76 yo male admitted with SBO with incarcerated hernia. PMH includes dementia, bladder cancer, COPD, HTN, stroke. ? ?Discussed patient in ICU rounds and with RN today. ?Patient was extubated 3/8 ?Patient having a lot of liquid stool output since yesterday; first BM since surgery. Stool is a little thicker today per RN.  ?Cortrak in place. Currently receiving Vital AF 1.2 at 65 ml/h. Free water flushes 200 ml every 6 hours.  ?Hopefully can get a swallow evaluation soon to see if patient can take POs.  ? ?Labs reviewed.  ?CBG: [phone] ? ?Medications reviewed and include Novolog, mag sulfate. ? ?Weight 75.5 kg remains stable. ? ?Diet Order:   ?Diet Order   ? ?       ?  Diet NPO time specified  Diet effective now       ?  ? ?  ?  ? ?  ? ? ?EDUCATION NEEDS:  ? ?No education needs have been identified at this time ? ?Skin:  Skin Assessment: Skin Integrity Issues: ?Skin Integrity Issues:: Incisions ?Incisions: closed lt abdomen ? ?Last BM:  3/10 ? ?Height:  ? ?Ht Readings from Last 1 Encounters:  ?03/02/21 $RemoveBe'5\' 8"'WCWjBqkul$  (1.727 m)  ? ? ?Weight:  ? ?Wt Readings from Last 1 Encounters:  ?03/10/21 75.5 kg  ? ? ?BMI:  Body mass index is 25.31 kg/m?. ? ?Estimated Nutritional Needs:  ? ?Kcal:   2000-2200 ? ?Protein:  100-120 gm ? ?Fluid:  >/= 2 L ? ? ? ?Lucas Mallow RD, LDN, CNSC ?Please refer to Amion for contact information.                                                       ? ?

## 2021-03-11 LAB — CBC
HCT: 34.4 % — ABNORMAL LOW (ref 39.0–52.0)
Hemoglobin: 10.7 g/dL — ABNORMAL LOW (ref 13.0–17.0)
MCH: 21.7 pg — ABNORMAL LOW (ref 26.0–34.0)
MCHC: 31.1 g/dL (ref 30.0–36.0)
MCV: 69.8 fL — ABNORMAL LOW (ref 80.0–100.0)
Platelets: 254 10*3/uL (ref 150–400)
RBC: 4.93 MIL/uL (ref 4.22–5.81)
RDW: 14.8 % (ref 11.5–15.5)
WBC: 13.6 10*3/uL — ABNORMAL HIGH (ref 4.0–10.5)
nRBC: 0 % (ref 0.0–0.2)

## 2021-03-11 LAB — BASIC METABOLIC PANEL
Anion gap: 8 (ref 5–15)
BUN: 26 mg/dL — ABNORMAL HIGH (ref 8–23)
CO2: 22 mmol/L (ref 22–32)
Calcium: 7.8 mg/dL — ABNORMAL LOW (ref 8.9–10.3)
Chloride: 111 mmol/L (ref 98–111)
Creatinine, Ser: 1.17 mg/dL (ref 0.61–1.24)
GFR, Estimated: >60 mL/min (ref >60)
Glucose, Bld: 205 mg/dL — ABNORMAL HIGH (ref 70–99)
Potassium: 3.9 mmol/L (ref 3.5–5.1)
Sodium: 141 mmol/L (ref 135–145)

## 2021-03-11 LAB — C DIFFICILE (CDIFF) QUICK SCRN (NO PCR REFLEX)
C Diff antigen: NEGATIVE
C Diff interpretation: NOT DETECTED
C Diff toxin: NEGATIVE

## 2021-03-11 LAB — GLUCOSE, CAPILLARY
Glucose-Capillary: 164 mg/dL — ABNORMAL HIGH (ref 70–99)
Glucose-Capillary: 174 mg/dL — ABNORMAL HIGH (ref 70–99)
Glucose-Capillary: 169 mg/dL — ABNORMAL HIGH (ref 70–99)
Glucose-Capillary: 129 mg/dL — ABNORMAL HIGH (ref 70–99)
Glucose-Capillary: 150 mg/dL — ABNORMAL HIGH (ref 70–99)
Glucose-Capillary: 143 mg/dL — ABNORMAL HIGH (ref 70–99)

## 2021-03-11 LAB — PHOSPHORUS: Phosphorus: 3.1 mg/dL (ref 2.5–4.6)

## 2021-03-11 LAB — MAGNESIUM: Magnesium: 1.8 mg/dL (ref 1.7–2.4)

## 2021-03-11 MED ORDER — SODIUM CHLORIDE 0.9 % IV SOLN
1.0000 g | Freq: Three times a day (TID) | INTRAVENOUS | Status: AC
  Administered 2021-03-11 (×2): 1 g via INTRAVENOUS
  Filled 2021-03-11 (×2): qty 20

## 2021-03-11 MED ORDER — FREE WATER
100.0000 mL | Freq: Four times a day (QID) | Status: DC
  Administered 2021-03-11 – 2021-03-17 (×22): 100 mL

## 2021-03-11 MED ORDER — MAGNESIUM SULFATE 2 GM/50ML IV SOLN
2.0000 g | Freq: Once | INTRAVENOUS | Status: AC
  Administered 2021-03-11: 2 g via INTRAVENOUS
  Filled 2021-03-11: qty 50

## 2021-03-11 MED ORDER — INSULIN ASPART 100 UNIT/ML IJ SOLN
0.0000 [IU] | INTRAMUSCULAR | Status: DC
  Administered 2021-03-11: 2 [IU] via SUBCUTANEOUS
  Administered 2021-03-11: 3 [IU] via SUBCUTANEOUS
  Administered 2021-03-11 (×2): 2 [IU] via SUBCUTANEOUS
  Administered 2021-03-12: 3 [IU] via SUBCUTANEOUS
  Administered 2021-03-12 (×2): 2 [IU] via SUBCUTANEOUS
  Administered 2021-03-12: 3 [IU] via SUBCUTANEOUS
  Administered 2021-03-12: 2 [IU] via SUBCUTANEOUS
  Administered 2021-03-13: 3 [IU] via SUBCUTANEOUS
  Administered 2021-03-13: 2 [IU] via SUBCUTANEOUS
  Administered 2021-03-13: 5 [IU] via SUBCUTANEOUS
  Administered 2021-03-13: 3 [IU] via SUBCUTANEOUS
  Administered 2021-03-13: 2 [IU] via SUBCUTANEOUS
  Administered 2021-03-14: 5 [IU] via SUBCUTANEOUS
  Administered 2021-03-14 (×3): 3 [IU] via SUBCUTANEOUS
  Administered 2021-03-15 – 2021-03-16 (×9): 2 [IU] via SUBCUTANEOUS
  Administered 2021-03-16 – 2021-03-17 (×2): 3 [IU] via SUBCUTANEOUS
  Administered 2021-03-17: 2 [IU] via SUBCUTANEOUS
  Administered 2021-03-17: 3 [IU] via SUBCUTANEOUS
  Administered 2021-03-17: 5 [IU] via SUBCUTANEOUS

## 2021-03-11 NOTE — Progress Notes (Signed)
eLink Physician-Brief Progress Note ?Patient Name: Jason [name] ?DOB: [birth year] ?MRN: [id number] ? ? ?Date of Service ? 03/11/2021  ?HPI/Events of Note ? Request for C diff test since patient with liquid diarrhea, leukocytosis and on antibiotics.   ?eICU Interventions ? Order placed   ? ? ? ?Intervention Category ?Intermediate Interventions: Abdominal pain - evaluation and management ? ?Octaviano Mukai G Cailan Antonucci ?03/11/2021, 9:59 PM ?

## 2021-03-11 NOTE — Progress Notes (Signed)
PHARMACY NOTE:  ANTIMICROBIAL RENAL DOSAGE ADJUSTMENT ? ?Current antimicrobial regimen includes a mismatch between antimicrobial dosage and estimated renal function.  As per policy approved by the Pharmacy & Therapeutics and Medical Executive Committees, the antimicrobial dosage will be adjusted accordingly. ? ?Current antimicrobial dosage:  meropenem 1g 12h ? ?Indication: PNA + UTI ? ?Renal Function: ? ?Estimated Creatinine Clearance: 51.2 mL/min (by C-G formula based on SCr of 1.17 mg/dL). ? ?   ?Antimicrobial dosage has been changed to:  meropenem 1g q8h  ? ?Additional comments: ? ? ?Thank you for allowing pharmacy to be a part of this patient's care. ? ?Cristela Felt, PharmD, BCPS ?Clinical Pharmacist ?03/11/2021 10:06 AM ? ?

## 2021-03-11 NOTE — Progress Notes (Addendum)
? ?NAMEBREYSON [name], MRN:  [id number], DOB:  1945/03/16, LOS: 9 ?ADMISSION DATE:  03/02/2021, CONSULTATION DATE:  03/11/2021 ?REFERRING MD:  Jason Kindle, MD CHIEF COMPLAINT:  SOB  ? ?History of Present Illness:  ?Pt is a 76 yo m w/ PMhx of dementia, bladder cancer presenting from SNF for SOB and emesis since early Tuesday AM. On arrival he was found to be somnolent w/ rhonchus breathing, sats in low 70s. Immediately intubated by ED MD and found feculent emesis throughout oropharynx and airway. Upon OGT placement had ~ 1L of immediate output. Per reports pt had associated tachypnea, fever and emesis since early Tuesday AM as well. No family present.  ? ?CT ab/pelvis on my review concerning for pneumatosis & SBO involving left inguinal & scrotum. Final read pending. ED MD was able to reduce palpable anterior abdominal wall portion however scrotum remains full. ED nursing unable to pass foley despite multiple attempts.  ? ?Pertinent  Medical History  ?Dementia  ?? Hx of Bladder cancer  ? ?Significant Hospital Events: ?Including procedures, antibiotic start and stop dates in addition to other pertinent events   ?3/2: admission for septic shock; central line and a-line placed ?3/3: decreased pressor and vent requirement. Echo with normal EF; G2DD. Moderate RV enlargement; P/F 153 ?3/4: off levo; on vaso alone. P/F 186 ?3/5: off pressors ?3/6: ESBL e.coli on UC. Transitioned to [facility].  ?3/7: inguinal hernia repair, no immediate complications. LUE duplex negative for DVT. CXR>improving infiltrates ?3/8: extubated; feeds started, a-line removed ?3/9: Surgery signed off ?3/11: on room air ?Interim History / Subjective:  ? ?No significant overnight events.  ? ?Vital signs stable ? ?Morning labs are in progress ? ?Objective   ?Blood pressure 114/72, pulse 73, temperature 99.8 ?F (37.7 ?C), temperature source Axillary, resp. rate 20, height '5\' 8"'$  (1.727 m), weight 75.4 kg, SpO2 100 %. ?   ?   ? ?Intake/Output Summary (Last 24  hours) at 03/11/2021 0837 ?Last data filed at 03/11/2021 0600 ?Gross per 24 hour  ?Intake 1549.76 ml  ?Output 1925 ml  ?Net -375.24 ml  ? ? ?Filed Weights  ? 03/08/21 0500 03/10/21 0400 03/11/21 0500  ?Weight: 74.6 kg 75.5 kg 75.4 kg  ? ? ?Examination: ?General: Chronically ill, no acute distress. ?Cardiac: RRR, normal heart sounds, no murmurs ?Respiratory: Coarse breath sounds bilaterally, normal effort ?Abdomen: soft, nontender, nondistended. Bowel sounds active ?Extremities: No LE edema or cyanosis. ?Skin: Warm and dry, heel dressing bilaterally ?Neuro: alert and oriented to self ? ?Resolved Hospital Problem list   ?Septic shock secondary to aspiration pna vs e.coli UTI ?ARDS ?Hypernatremia ?Acute hypoxic respiratory failure--room air 3/11 ?SBO--started passing stool 3/9 ?Inguinal hernia--s/p repair 3/7 ?AKI--resolved 3/11 ? ?Assessment & Plan:  ? ?Aspiration pneumonia ?3/6 respiratory culture with rare klebsiella pneumoniae ?Weaned to room air this morning but continues to have copious secretions ?ESBL E.coli UTI ?-Continue merrem (last dose 3/13) ?-NT suctioning as needed ?-Pulm hygeine ? ?Moderate Malnutrition ?-SLP eval to see if we can start transitioning him to a diet.  ?-Continue TF for now ? 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 ?Transient atrial fibrillation.  ?Likely due to acute illness. Still in SR. CHADS2VASC 4.  ?-continue amio ?-eliquis started 3/10 ? ?GERD ?-protonix ? ?Dementia  ?At baseline ambulates, AAOx1. Needs ADL assistance and minimally communicative   ?-Delirium precautions ?-PT/OT eval ? ?Patient is medically stable to transfer  out of ICU. IMTS to assume care 3/12. Case signed out to Dr. Court Joy. ? ?Best Practice (right click and "Reselect all SmartList Selections" daily)  ? ?Diet/type: tubefeeds ?DVT prophylaxis: DOAC ?GI prophylaxis: PPI ?Lines: N/A ?Foley:  Yes, and it is  still needed ?Code Status:  full code ?Family updated 3/11. ? ?[name], MD ?Internal Medicine Resident PGY-3 ?[name] ?Pager: [phone] ?03/11/2021 8:50 AM  ?  ? ? ?

## 2021-03-11 NOTE — Evaluation (Signed)
Clinical/Bedside Swallow Evaluation ?Patient Details  ?Name: Jason [name] ?MRN: [id number] ?Date of Birth: October 18, 1945 ? ?Today's Date: 03/11/2021 ?Time: SLP Start Time (ACUTE ONLY): [id number] SLP Stop Time (ACUTE ONLY): 1002 ?SLP Time Calculation (min) (ACUTE ONLY): 11 min ? ?Past Medical History:  ?Past Medical History:  ?Diagnosis Date  ? Bladder mass 02-10-13  ? surgery planned for this  ? COPD (chronic obstructive pulmonary disease) ([place])   ? previous history and current smoking  ? Dementia ([place]) 02/18/2013  ? 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 history of-no problems  ? Hypertension   ? States" never any meds for this"  ? Right bundle branch block   ? history of this  ? Stroke [facility])   ? Dementia, otherwise, no residual. 04-01-14 all resolved   ? 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 ?Past Surgical History:  ?Past Surgical History:  ?Procedure Laterality Date  ? BALLOON DILATION N/A 04/07/2014  ? Procedure: BALLOON DILATION;  Surgeon: Ardis Hughs, MD;  Location: [facility];  Service: Urology;  Laterality: N/A;  ? CYSTOSCOPY W/ RETROGRADES Bilateral 04/07/2014  ? Procedure: BILATERAL RETROGRADE PYELOGRAM;  Surgeon: Ardis Hughs, MD;  Location: [facility];  Service: Urology;  Laterality: Bilateral;  ? CYSTOSCOPY/RETROGRADE/URETEROSCOPY Bilateral 02/13/2013  ? Procedure: BILATERAL RETROGRADE;  Surgeon: Ardis Hughs, MD;  Location: [facility];  Service: Urology;  Laterality: Bilateral;  ? INGUINAL HERNIA REPAIR Right 01/30/2018  ? Procedure: OPEN RIGHT INGUINAL HERNIA REPAIR WITH MESH;  Surgeon: Erroll Luna, MD;  Location: [place];  Service: General;  Laterality: Right;  ? INGUINAL HERNIA REPAIR Left 03/07/2021  ? Procedure: HERNIA REPAIR INGUINAL WITH MESH;  Surgeon: Rolm Bookbinder, MD;  Location: [place];  Service: General;  Laterality: Left;  ? INSERTION OF MESH Left 03/07/2021  ? Procedure: INSERTION OF MESH;  Surgeon: Rolm Bookbinder, MD;  Location: [place];  Service: General;  Laterality: Left;  ? LAPAROTOMY N/A  02/18/2013  ? Procedure: EXPLORATORY LAPAROTOMY drainage of retroperitoneal abscess, drainage of Pre-Peritoneal abscess and Exploration of bladder perforation;  Surgeon: Imogene Burn. Georgette Dover, MD;  Location: [place];  Service: General;  Laterality: N/A;  ? TRANSURETHRAL RESECTION OF BLADDER TUMOR WITH GYRUS (TURBT-GYRUS) N/A 02/13/2013  ? Procedure: TRANSURETHRAL RESECTION OF BLADDER TUMOR WITH GYRUS (TURBT-GYRUS), bladder biopsies;  Surgeon: Ardis Hughs, MD;  Location[facility];  Service: Urology;  Laterality: N/A;  ? ?HPI:  ?76 yo m presenting from SNF 03/02/21 for SOB, fever and emesis since 03/07/21. On arrival he was found to be somnolent w/ rhonchus breathing, sats in low 70s. Immediately intubated by ED MD and found feculent emesis throughout oropharynx and airway. found to have incarcerated LIH with SBO and aspiration PNA s/p Left inguinal hernia repair with Ultrapro mesh Extubated 03/08/21 PMhx of dementia, bladder cancer  ?  ?Assessment / Plan / Recommendation  ?Clinical Impression ? Patient is not appropriate for pos at this time. He is alert and was cooperative for exam with lots of clinician cueing given decreased mental status and agitation. He does however have copious secretions, requring frequent NT suctioning, which increased once ice chips trials initiated indicative of poor airway protection. Patient unable to cough in attempts to clear despite max cues. Prognosis for improvement good with overall improved conditioning and time post prolonged intubation. SLP will continue to follow along for diagnostic treatment. ?SLP Visit Diagnosis: Dysphagia, oropharyngeal phase (R13.12) ?   ?Aspiration Risk ? Severe aspiration risk  ?  ?Diet Recommendation NPO;Alternative means - temporary  ? ?Medication Administration:  Via alternative means  ?  ?Other  Recommendations Oral Care Recommendations: Oral care QID   ? ?Recommendations for follow up therapy are one component of a multi-disciplinary discharge planning  process, led by the attending physician.  Recommendations may be updated based on patient status, additional functional criteria and insurance authorization. ? ?Follow up Recommendations Skilled nursing-short term rehab (<3 hours/day)  ? ? ?  ?Assistance Recommended at Discharge    ?Functional Status Assessment    ?Frequency and Duration min 3x week  ?2 weeks ?  ?   ? ?Prognosis Prognosis for Safe Diet Advancement: Good ?Barriers to Reach Goals: Severity of deficits  ? ?  ? ?Swallow Study   ?General HPI: 76 yo m presenting from SNF 03/02/21 for SOB, fever and emesis since 03/07/21. On arrival he was found to be somnolent w/ rhonchus breathing, sats in low 70s. Immediately intubated by ED MD and found feculent emesis throughout oropharynx and airway. found to have incarcerated LIH with SBO and aspiration PNA s/p Left inguinal hernia repair with Ultrapro mesh Extubated 03/08/21 PMhx of dementia, bladder cancer ?Type of Study: Bedside Swallow Evaluation ?Previous Swallow Assessment: Per notes, MBS in 2015 with silent aspiration, bedside swallow in 2017 with recommendations for dysphagia 3 with thin liquids. On enote in 2020 in which daughter reports possible regurgitation of a "chicken bone" ?Diet Prior to this Study: NPO ?Temperature Spikes Noted: No ?Respiratory Status: Room air ?History of Recent Intubation: Yes ?Length of Intubations (days): 6 days ?Date extubated: 03/08/21 ?Behavior/Cognition: Alert;Distractible;Requires cueing;Agitated ?Oral Cavity Assessment: Excessive secretions ?Oral Care Completed by SLP: Yes ?Vision: Functional for self-feeding ?Self-Feeding Abilities: Able to feed self ?Patient Positioning: Upright in bed ?Baseline Vocal Quality: Wet;Hoarse (significant upper airway secretions, audible on both inhalation and exhalation) ?Volitional Cough: Weak;Congested;Wet ?Volitional Swallow: Unable to elicit  ?  ?Oral/Motor/Sensory Function Overall Oral Motor/Sensory Function: Other (comment) (cognitively  unable to participate in full exam, appears to have generalized oral weakness)   ?Ice Chips Ice chips: Impaired ?Presentation: Spoon ?Pharyngeal Phase Impairments: Decreased hyoid-laryngeal movement;Wet Vocal Quality (increased wet upper airway noise)   ?Thin Liquid Thin Liquid: Not tested  ?  ?Nectar Thick Nectar Thick Liquid: Not tested   ?Honey Thick Honey Thick Liquid: Not tested   ?Puree Puree: Not tested   ?Solid ? ? ?  Solid: Not tested  ? ?  ?Doralene Glanz MA, CCC-SLP ? ?Thomas Mabry Meryl ?03/11/2021,10:26 AM ? ? ? ?

## 2021-03-12 LAB — BASIC METABOLIC PANEL
Anion gap: 7 (ref 5–15)
BUN: 25 mg/dL — ABNORMAL HIGH (ref 8–23)
CO2: 22 mmol/L (ref 22–32)
Calcium: 8.1 mg/dL — ABNORMAL LOW (ref 8.9–10.3)
Chloride: 111 mmol/L (ref 98–111)
Creatinine, Ser: 1.11 mg/dL (ref 0.61–1.24)
GFR, Estimated: >60 mL/min (ref >60)
Glucose, Bld: 182 mg/dL — ABNORMAL HIGH (ref 70–99)
Potassium: 3.4 mmol/L — ABNORMAL LOW (ref 3.5–5.1)
Sodium: 140 mmol/L (ref 135–145)

## 2021-03-12 LAB — CBC
HCT: 33.9 % — ABNORMAL LOW (ref 39.0–52.0)
Hemoglobin: 10.8 g/dL — ABNORMAL LOW (ref 13.0–17.0)
MCH: 21.9 pg — ABNORMAL LOW (ref 26.0–34.0)
MCHC: 31.9 g/dL (ref 30.0–36.0)
MCV: 68.8 fL — ABNORMAL LOW (ref 80.0–100.0)
Platelets: 324 10*3/uL (ref 150–400)
RBC: 4.93 MIL/uL (ref 4.22–5.81)
RDW: 14.7 % (ref 11.5–15.5)
WBC: 12.2 10*3/uL — ABNORMAL HIGH (ref 4.0–10.5)
nRBC: 0 % (ref 0.0–0.2)

## 2021-03-12 LAB — GLUCOSE, CAPILLARY
Glucose-Capillary: 150 mg/dL — ABNORMAL HIGH (ref 70–99)
Glucose-Capillary: 194 mg/dL — ABNORMAL HIGH (ref 70–99)
Glucose-Capillary: 178 mg/dL — ABNORMAL HIGH (ref 70–99)
Glucose-Capillary: 168 mg/dL — ABNORMAL HIGH (ref 70–99)
Glucose-Capillary: 173 mg/dL — ABNORMAL HIGH (ref 70–99)
Glucose-Capillary: 145 mg/dL — ABNORMAL HIGH (ref 70–99)
Glucose-Capillary: 138 mg/dL — ABNORMAL HIGH (ref 70–99)

## 2021-03-12 LAB — FERRITIN: Ferritin: 167 ng/mL (ref 24–336)

## 2021-03-12 MED ORDER — POTASSIUM CHLORIDE 20 MEQ PO PACK
40.0000 meq | PACK | Freq: Once | ORAL | Status: AC
  Administered 2021-03-12: 40 meq
  Filled 2021-03-12: qty 2

## 2021-03-12 MED ORDER — SODIUM CHLORIDE 0.9 % IV SOLN
1.0000 g | Freq: Three times a day (TID) | INTRAVENOUS | Status: AC
  Administered 2021-03-12 – 2021-03-13 (×5): 1 g via INTRAVENOUS
  Filled 2021-03-12 (×6): qty 20

## 2021-03-12 MED ORDER — GUAIFENESIN 100 MG/5ML PO LIQD
10.0000 mL | Freq: Once | ORAL | Status: AC
  Administered 2021-03-12: 10 mL

## 2021-03-12 MED ORDER — GUAIFENESIN 100 MG/5ML PO LIQD
10.0000 mL | Freq: Four times a day (QID) | ORAL | Status: DC
  Administered 2021-03-12 – 2021-03-20 (×24): 10 mL
  Filled 2021-03-12 (×33): qty 10

## 2021-03-12 NOTE — Progress Notes (Addendum)
SLP Cancellation Note ? ?Patient Details ?Name: Jason [name] ?MRN: [id number] ?DOB: 05/28/1945 ? ? ?Cancelled treatment:       Reason Eval/Treat Not Completed: Other (comment) (SLP spoke with patient's RN who reported that patient still with audible pharyngeal secretions which patient cannot clear and with patient only performing cued cough inconsistently.(non-productive). SLP will f/u next date for readiness to trial PO's.) ? ?Sonia Baller, MA, CCC-SLP ?Speech Therapy ? ?

## 2021-03-12 NOTE — Progress Notes (Addendum)
? ? ?Subjective:  ?Overnight Events: C.Diff testing was ordered by [facility]. Electrolyte replacement ordered for K 3.4. ? ? Patient has baseline dementia. Says yes when asked if he is [name]. He does not say his name when asked. He has audible secretions and a weak cough. He is not clearing.  ? ?Objective:  ?Vital signs in last 24 hours: ?Vitals:  ? 03/12/21 0400 03/12/21 0500 03/12/21 0600 03/12/21 0718  ?BP:      ?Pulse: 79 73 76   ?Resp: (!) 32 (!) 27 (!) 37   ?Temp:    97.6 ?F (36.4 ?C)  ?TempSrc:    Oral  ?SpO2: 99% 99% 100%   ?Weight:  74.8 kg    ?Height:      ? ? ? ? ?Physical Exam:  ? ?General: Chronically ill appearing man , audible upper airway secretions , attentive to my interview , blood in suction canister ?HEENT: MMM, normal conjunctiva pallor  , healing ulcer on oral mucosa side of right upper molars, blood tinge sputum and excess secretions in patient oropharynx  ?Cardiovascular: Normal rate, regular rhythm.  No murmurs, rubs, or gallops ?Pulmonary : Normal effort on room air, rhonchi most audible over trachea  ?Abdominal: soft, nontender,  bowel sounds present ?Ext: no edema, warm  ? ?Filed Weights  ? 03/10/21 0400 03/11/21 0500 03/12/21 0500  ?Weight: 75.5 kg 75.4 kg 74.8 kg  ? ? ? ?Intake/Output Summary (Last 24 hours) at 03/12/2021 0737 ?Last data filed at 03/12/2021 0600 ?Gross per 24 hour  ?Intake 850 ml  ?Output 1975 ml  ?Net -1125 ml  ? ?Net IO Since Admission: 6,011.57 mL [03/12/21 0737] ? ? ?Assessment/Plan:  ? ?Principal Problem: ?  Small bowel obstruction (Jason [name]) ?Active Problems: ?  Septic shock (Jason [name]) ?  Malnutrition of moderate degree ? ? ?Patient Summary: ?Hospital day 10 for Jason [name] a 76 y.o. person living with dementia, COPD and bladder cancer s/p resection who primary care physician is [facility] and he is resident at [facility] facility admitted for acute hypoxic and hypercapnic respiratory failure from aspiration pneumonia.   ? ?Patient was found to have  incarcerated hernia now status post repair which caused bowel obstruction and likely cause of aspiration.  Klebsiella was found on respiratory cultures 3/6. Patient also found to have urinary stricture and urology consulted for foley placement. He has a history of bladder cancer status post resection. ? ?#ESBL E.Coli UTI ?- Urine culture 3/02 showed ESBL E. coli ?-Patient's white blood cell count is trending down, afebrile.  Patient cannot give me symptoms on exam, I expect this is from his underlying dementia. ?-Continue meropenem, stop date 3/13 ? ?(resolved) Acute hypoxic and hypercapnic respiratory failure from Klebsiella aspiration pneumonia, aspiration from SBO 2/2 incarcerated bowel inguinal hernia s/p repair ?# Aspiration risk, excess secretions ?-Patient is now on room air.  Patient has excessive secretions. ?- Aspiration precautions ?- Continue oral care  ?-Increase guaifenesin dose to 44m every 6 hours ?-Surgery signed off on 3/09 and will arrange follow-up patient ? ?#Bulbar urethra stricture ?- history of bladder cancer and urology was consulted to place Foley catheter.  Recommendations on 3/2 was to continue Foley catheter until patient was able to express painful inability to urinate.  Patient is followed by Dr. HLouis [name] he was notified on admission by colleague who consulted, Dr.Eskridge. ?- Patient is not oriented today on exam, will continue foley catheter today.  Will need to touch base with urology for further plan  for removing Foley. ? ?#Paroxysmal Atrial Fibrillation ?-Paroxysmal A-fib in setting of sepsis.  Patient in normal sinus rhythm today. ?-Likely will DC Eliquis and amiodarone, will discuss with attending before discontinuing. ? ?#Dementia ?-Chart review showed patient was oriented to person place and time before admission.  He has not been oriented to place or time with RN and he has not oriented to place or time on my exam.  He endorses answers but does not carry conversation.  I  will reach out to daughter for further information. ? ?#Diet ?-Patient currently on tube feeds.  SLP is following but patient has not shown that he can clear his airway. ?-Continue tube feeds ? ?#Diarrhea ?- C Diff negative. Patient not on laxatives. ?- Likely from tube feeds. Hopefully this can be removed soon. If not , will need to talk to RD about options of adding fiber to tube feeds?  ?- Monitor electrolytes with BMP.  ? ?#Microcytic Anemia ?- Check iron studies in am ? ?Diet: Tube Feeds ?IVF: None,None ?VTE: NOAC ?Code: Full ?PT/OT recs: SNF for Subacute PT, none. ?TOC recs: [facility] for past 7-8 year.  Daughter would like pt to return.  ?Family Update: I updated daughter- she saw her father yesterday and says he is at is baseline. I explained he could not tell me where he was or the year this morning. He endorsed he was Jason [name], but did not tell me his name. She endorsed this sounds normal to her.  ? ? ?Dispo: Anticipated discharge to Skilled nursing facility in 2 days pending passing swallow study and finishing IV antibiotic.  ? ?Tamsen Snider, MD ?PGY3 Internal Medicine ?Pager: [phone] ?Please contact the on call pager after 5 pm and on weekends at [phone]. ? ?

## 2021-03-12 NOTE — Progress Notes (Signed)
[facility] ADULT ICU REPLACEMENT PROTOCOL ? ? ?The patient does apply for the [facility] Adult ICU Electrolyte Replacment Protocol based on the criteria listed below:  ? ?1.Exclusion criteria: TCTS patients, ECMO patients, and Dialysis patients ?2. Is GFR >/= 30 ml/min? Yes.    ?Patient's GFR today is >60 ?3. Is SCr </= 2? Yes.   ?Patient's SCr is 1.11 mg/dL ?4. Did SCr increase >/= 0.5 in 24 hours? No. ?5.Pt's weight >40kg  Yes.   ?6. Abnormal electrolyte(s): K+3.4  ?7. Electrolytes replaced per protocol ?8.  Call MD STAT for K+ </= 2.5, Phos </= 1, or Mag </= 1 ?Physician:  K+3.4 ? ?Carlisle Beers 03/12/2021 6:02 AM  ?

## 2021-03-12 NOTE — Progress Notes (Signed)
Called Daughter Nic at [phone] to notify of patient's move to 5N-12. Left message. ?

## 2021-03-13 LAB — CBC WITH DIFFERENTIAL/PLATELET
Abs Immature Granulocytes: 0.09 10*3/uL — ABNORMAL HIGH (ref 0.00–0.07)
Basophils Absolute: 0 10*3/uL (ref 0.0–0.1)
Basophils Relative: 0 %
Eosinophils Absolute: 0.1 10*3/uL (ref 0.0–0.5)
Eosinophils Relative: 1 %
HCT: 32.2 % — ABNORMAL LOW (ref 39.0–52.0)
Hemoglobin: 10.5 g/dL — ABNORMAL LOW (ref 13.0–17.0)
Immature Granulocytes: 1 %
Lymphocytes Relative: 12 %
Lymphs Abs: 1.4 10*3/uL (ref 0.7–4.0)
MCH: 22.2 pg — ABNORMAL LOW (ref 26.0–34.0)
MCHC: 32.6 g/dL (ref 30.0–36.0)
MCV: 67.9 fL — ABNORMAL LOW (ref 80.0–100.0)
Monocytes Absolute: 0.8 10*3/uL (ref 0.1–1.0)
Monocytes Relative: 7 %
Neutro Abs: 8.8 10*3/uL — ABNORMAL HIGH (ref 1.7–7.7)
Neutrophils Relative %: 79 %
Platelets: 338 10*3/uL (ref 150–400)
RBC: 4.74 MIL/uL (ref 4.22–5.81)
RDW: 14.5 % (ref 11.5–15.5)
WBC: 11.1 10*3/uL — ABNORMAL HIGH (ref 4.0–10.5)
nRBC: 0 % (ref 0.0–0.2)

## 2021-03-13 LAB — IRON AND TIBC
Iron: 15 ug/dL — ABNORMAL LOW (ref 45–182)
Saturation Ratios: 11 % — ABNORMAL LOW (ref 17.9–39.5)
TIBC: 140 ug/dL — ABNORMAL LOW (ref 250–450)
UIBC: 125 ug/dL

## 2021-03-13 LAB — RETICULOCYTES
Immature Retic Fract: 22.9 % — ABNORMAL HIGH (ref 2.3–15.9)
RBC.: 4.75 MIL/uL (ref 4.22–5.81)
Retic Count, Absolute: 43.7 10*3/uL (ref 19.0–186.0)
Retic Ct Pct: 0.9 % (ref 0.4–3.1)

## 2021-03-13 LAB — BASIC METABOLIC PANEL
Anion gap: 7 (ref 5–15)
BUN: 25 mg/dL — ABNORMAL HIGH (ref 8–23)
CO2: 22 mmol/L (ref 22–32)
Calcium: 8.4 mg/dL — ABNORMAL LOW (ref 8.9–10.3)
Chloride: 111 mmol/L (ref 98–111)
Creatinine, Ser: 1.13 mg/dL (ref 0.61–1.24)
GFR, Estimated: >60 mL/min (ref >60)
Glucose, Bld: 237 mg/dL — ABNORMAL HIGH (ref 70–99)
Potassium: 4.2 mmol/L (ref 3.5–5.1)
Sodium: 140 mmol/L (ref 135–145)

## 2021-03-13 LAB — GLUCOSE, CAPILLARY
Glucose-Capillary: 120 mg/dL — ABNORMAL HIGH (ref 70–99)
Glucose-Capillary: 127 mg/dL — ABNORMAL HIGH (ref 70–99)
Glucose-Capillary: 136 mg/dL — ABNORMAL HIGH (ref 70–99)
Glucose-Capillary: 187 mg/dL — ABNORMAL HIGH (ref 70–99)
Glucose-Capillary: 190 mg/dL — ABNORMAL HIGH (ref 70–99)
Glucose-Capillary: 200 mg/dL — ABNORMAL HIGH (ref 70–99)
Glucose-Capillary: 207 mg/dL — ABNORMAL HIGH (ref 70–99)

## 2021-03-13 MED ORDER — GERHARDT'S BUTT CREAM
TOPICAL_CREAM | Freq: Two times a day (BID) | CUTANEOUS | Status: DC
  Administered 2021-03-15: 1 via TOPICAL
  Filled 2021-03-13 (×3): qty 1

## 2021-03-13 NOTE — Progress Notes (Signed)
Pt with gurgling sounds and visible white secretions in mouth but unable to cough when asked. Pt allowed RN to do oral suction few times and some oral care. ?

## 2021-03-13 NOTE — Progress Notes (Signed)
Brief Nutrition Note ? ?Consult received to assess TF regimen as pt has been having diarrhea. Pt receiving Jevity 1.5 @ 37m/hr which already contains fiber. RD suspects diarrhea is 2/2 IV Merrem; recommend imodium to help control loose stools. Discussed with resident MD.  ? ?Admitting Dx: Dehydration [E86.0] ?Small bowel obstruction ([place] [K56.609] ?SBO (small bowel obstruction) ([place] [K56.609] ?Hyperglycemia [R73.9] ?AKI (acute kidney injury) ([place] [N17.9] ?Incarcerated hernia [K46.0] ?Aspiration pneumonia due to gastric secretions, unspecified laterality, unspecified part of lung ([place] [J69.0] ?Sepsis, due to unspecified organism, unspecified whether acute organ dysfunction present ([place] [A41.9] ?Vomiting, unspecified vomiting type, unspecified whether nausea present [R11.10] ? ?Body mass index is 25.01 kg/m?.Marland KitchenPt meets criteria for normal based on current BMI. ? ?Labs: ?Recent Labs  ?Lab 03/09/21 ?1600 03/10/21 ?0680303/10/23 ?0212203/11/23 ?0805 03/12/21 ?0148 03/13/21 ?04825 ?NA  --    < >  --  141 140 140  ?K  --    < >  --  3.9 3.4* 4.2  ?CL  --    < >  --  111 111 111  ?CO2  --    < >  --  '22 22 22  '$ ?BUN  --    < >  --  26* 25* 25*  ?CREATININE  --    < >  --  1.17 1.11 1.13  ?CALCIUM  --    < >  --  7.8* 8.1* 8.4*  ?MG 2.0  --  1.7 1.8  --   --   ?PHOS 2.8  --  2.7 3.1  --   --   ?GLUCOSE  --    < >  --  205* 182* 237*  ? < > = values in this interval not displayed.  ? ? ? ?ATheone Stanley, MS, RD, LDN (she/her/hers) ?RD pager number and weekend/on-call pager number located in [email or web address] ? ? ?

## 2021-03-13 NOTE — Consult Note (Signed)
WOC Nurse Consult Note: ?Reason for Consult: scrotal desquamation and MASD (moisture associated skin damage) ?Wound type:moisture; has FC; had FMS does not have in place now, but liquid stools have not been an issue since transfer from the ICU per bedside nursing staff  ?Pressure Injury POA: NA ?Dressing procedure/placement/frequency: ?Disucussed with bedside nurse; will add Gerhardts butt cream; dc floor moisture barrier ointment. Consider scrotal support, will use antimicrobial wicking fabric first.  ?Will order air mattress for moisture management  ?Discussed POC with patient and bedside nurse.  ?Re consult if needed, will not follow at this time. ?Thanks ? Yona Kosek [facility] MSN, RN,CWOCN, CNS, CWON-AP [phone])  ? ? ?  ?

## 2021-03-13 NOTE — Progress Notes (Signed)
? ?Subjective: RT unable to perform flutter, as patient swatted hand away. No further events otherwise. ? ?He opens eyes to voice. Minimally verbal, but hypophonic when he does speak 2/2 oropharyngeal secretions. Does not follow commands. Gurgling noises in mouth but no cough reflex. ? ?Called [facility] to update on patient status.  ? ?Objective: ? ?Vital signs in last 24 hours: ?Vitals:  ? 03/12/21 2220 03/13/21 0427 03/13/21 0500 03/13/21 0513  ?BP: (!) 150/69 (!) 159/81    ?Pulse: 80 86  88  ?Resp: '20 20  20  '$ ?Temp: 98.6 ?F (37 ?C) 98.1 ?F (36.7 ?C)    ?TempSrc: Axillary Axillary    ?SpO2: 100% 99%  98%  ?Weight:   74.6 kg   ?Height:      ? ?Physical Exam: ?General: Chronically ill-appearing male with Cortrak in place, but in NAD ?HENT: normocephalic, atraumatic, external nares and ears appear normal.  ?EYES: conjunctiva non-erythematous ?CV: regular rate, normal rhythm on telemetry ?Pulmonary: normal work of breathing on RA., but with audible wheezes without stethoscope ?Abdominal: Mildly distended, unable to assess tenderness due to lack of patient cognition, surgical glue in place from hernia repair. ?Skin: Warm and dry.  Bilateral heels without pressure wounds.  Blanching of bottom of feet.  Left-sided scrotum with stage II and III pressure wounds; 29m x 169mnonhealing lesion on penis. Healed pressure wounds of anal fissure.  ?Neurological: Awake, alert but not oriented to place, situation, or time.  Unsure if patient was oriented to person, but responds to name by looking toward speaker.  Has some appropriate responses ?Psych: Confused and unable to follow commands but otherwise normal affect and behavior.  ? ?Assessment/Plan: ? ?Principal Problem: ?  Small bowel obstruction ([facility]?Active Problems: ?  Septic shock ([place]?  Malnutrition of moderate degree ? ?Jason [name] a [age]ear old male with PMHx dementia, COPD, and bladder cancer s/p resection who comes to our team from ICU after management of  acute hypoxic and hypercapnic respiratory failure from aspiration pneumonia.  ? ?#ESBL E.Coli UTI ?Urine culture 3/02 showed ESBL E. coli ?WBC downtrending (11.1<-12.2), and patient remains afebrile. ?-Continue meropenem, stop date 3/13 ?  ?# Aspiration risk 2/2 excess secretions ?(resolved) Acute hypoxic and hypercapnic respiratory failure from Klebsiella aspiration pneumonia, aspiration from SBO 2/2 incarcerated bowel inguinal hernia s/p repair ?Patient is now on room air, but has excessive secretions and unable to cough on command to clear. ?- Aspiration precautions ?- Continue oral care  ?-Continue guaifenesin 1093mvery 6 hours ?-Surgery signed off on 3/09 and will arrange follow-up patient ?  ?#Bulbar urethra stricture ?Patient with history of bladder cancer. While in the ICU (on 3/2), urology was consulted to place Foley catheter with recommendations to continue Foley catheter until patient was able to express painful inability to urinate. Patient is followed by Dr. HerLouis Meckeltpatient.  ?-Spoke to Dr. MacCain Sieveo recommended leaving catheter in place and following up in clinic in 1 week for void in trial vs. exchanging catheter. ?  ?#Paroxysmal Atrial Fibrillation ?Paroxysmal A-fib in setting of sepsis. Patient remains in normal sinus rhythm today; day 2 of normal sinus rhythm. ?-D/C Eliquis and amiodarone ?  ?#Dementia ?Per chart review patient oriented to person, place, and time at baseline. During this admission, he has not been oriented to place or time with RN nor on this writer's exam. Per resident, JefTamsen SniderD, daughter saw him and reported that he is at his baseline. He answers some questions  appropriately but does not engage in conversation. ?  ?#Diet ?Patient currently on tube feeds.  SLP is following but patient has not shown that he can clear his airway. ?-Continue tube feeds ?  ?#Diarrhea ?C Diff negative, and patient not on laxatives so presumed from tube feeds.  ?- consult placed to RD to  inquire about adding fiber to tube feeds. ?- Monitor electrolytes with BMP.  ?  ?#Microcytic Anemia ?Iron 15, TIBC 140, Saturation 11. ?- Will hold off transfusion due to infection ?  ? ?Prior to Admission Living Arrangement: [place]  ?Anticipated Discharge Location: [name] ?Barriers to Discharge: Completing treatment ?Dispo: Anticipated discharge in approximately 1-3 day(s).  ? Rosezetta Schlatter, MD ?03/13/2021, 7:19 AM ?Pager: [phone] ?After 5pm on weekdays and 1pm on weekends: On Call pager [phone]  ?

## 2021-03-13 NOTE — Progress Notes (Signed)
RT Note:  Attempted to initiate flutter with patient. Prior asked patient to cough. Performed oral suction with yaunker. Patient yelled at RT and swatted at yaunker/RT hand. Patient refused to perform flutter at this time. ?

## 2021-03-13 NOTE — Plan of Care (Signed)
?  Problem: Health Behavior/Discharge Planning: Goal: Ability to manage health-related needs will improve Outcome: Progressing   Problem: Safety: Goal: Ability to remain free from injury will improve Outcome: Progressing   Problem: Skin Integrity: Goal: Risk for impaired skin integrity will decrease Outcome: Progressing   

## 2021-03-13 NOTE — Progress Notes (Signed)
RT attempted flutter valve with pt, but pt would not follow commands. RT also attempted to NTS pt , but pt became combative.  ?

## 2021-03-13 NOTE — Hospital Course (Signed)
He opens eyes to voice. Nonverbal. Does not follow commands. Gurgling noises in mouth but no cough reflex. ? ?Need to speak with [facility]. ?

## 2021-03-13 NOTE — Progress Notes (Signed)
Physical Therapy Treatment ?Patient Details ?Name: Jason [name] ?MRN: [id number] ?DOB: [birth year] ?Today's Date: 03/13/2021 ? ? ?History of Present Illness 76 yo m presenting from SNF 03/09/21 for SOB, fever and emesis since 03/07/21. On arrival he was found to be somnolent w/ rhonchus breathing, sats in low 70s. Immediately intubated by ED MD and found feculent emesis throughout oropharynx and airway. found to have incarcerated LIH with SBO and aspiration PNA s/p Left inguinal hernia repair with Ultrapro mesh Extubated 03/08/21 PMhx of dementia, bladder cancer ? ?  ?PT Comments  ? ? Pt cooperative with pt-directed PT session. Pt willing to sit EOB. He required mod assist bed mobility. He sat EOB x 3-4 minutes prior to return to supine. Following one-step commands 25% of time. Lungs sound very junky, with rattling breath sounds. Pt supine in bed at end of session. ?   ?Recommendations for follow up therapy are one component of a multi-disciplinary discharge planning process, led by the attending physician.  Recommendations may be updated based on patient status, additional functional criteria and insurance authorization. ? ?Follow Up Recommendations ? Skilled nursing-short term rehab (<3 hours/day) ?  ?  ?Assistance Recommended at Discharge Frequent or constant Supervision/Assistance  ?Patient can return home with the following   ?  ?Equipment Recommendations ? None recommended by PT  ?  ?Recommendations for Other Services   ? ? ?  ?Precautions / Restrictions Precautions ?Precautions: Fall ?Precaution Comments: Cortrak, L inguinal hernia incision glued shut, can be agressive/combative ?Restrictions ?Weight Bearing Restrictions: No  ?  ? ?Mobility ? Bed Mobility ?Overal bed mobility: Needs Assistance ?Bed Mobility: Supine to Sit, Sit to Supine ?  ?  ?Supine to sit: Mod assist, HOB elevated ?Sit to supine: Mod assist, HOB elevated ?  ?General bed mobility comments: assist with BLE and trunk. Therapist moving slowly, letting pt  control the speed ?  ? ?Transfers ?  ?  ?  ?  ?  ?  ?  ?  ?  ?General transfer comment: did not progress beyond EOB ?  ? ?Ambulation/Gait ?  ?  ?  ?  ?  ?  ?  ?  ? ? ?Stairs ?  ?  ?  ?  ?  ? ? ?Wheelchair Mobility ?  ? ?Modified Rankin (Stroke Patients Only) ?  ? ? ?  ?Balance   ?  ?  ?  ?  ?  ?  ?  ?  ?  ?  ?  ?  ?  ?  ?  ?  ?  ?  ?  ? ?  ?Cognition Arousal/Alertness: Awake/alert ?Behavior During Therapy: Flat affect ?Overall Cognitive Status: History of cognitive impairments - at baseline ?  ?  ?  ?  ?  ?  ?  ?  ?  ?  ?  ?  ?  ?  ?  ?  ?  ?  ?  ? ?  ?Exercises   ? ?  ?General Comments General comments (skin integrity, edema, etc.): Pt on RA. Lungs sound very junky. ?  ?  ? ?Pertinent Vitals/Pain Pain Assessment ?Pain Assessment: Faces ?Faces Pain Scale: Hurts a little bit ?Pain Location: generalized ?Pain Descriptors / Indicators: Grimacing ?Pain Intervention(s): Monitored during session  ? ? ?Home Living   ?  ?  ?  ?  ?  ?  ?  ?  ?  ?   ?  ?Prior Function    ?  ?  ?   ? ?  PT Goals (current goals can now be found in the care plan section) Acute Rehab PT Goals ?Patient Stated Goal: none stated ?Progress towards PT goals: Progressing toward goals ? ?  ?Frequency ? ? ? Min 2X/week ? ? ? ?  ?PT Plan Current plan remains appropriate  ? ? ?Co-evaluation   ?  ?  ?  ?  ? ?  ?AM-PAC PT "6 Clicks" Mobility   ?Outcome Measure ? Help needed turning from your back to your side while in a flat bed without using bedrails?: A Lot ?Help needed moving from lying on your back to sitting on the side of a flat bed without using bedrails?: A Lot ?Help needed moving to and from a bed to a chair (including a wheelchair)?: Total ?Help needed standing up from a chair using your arms (e.g., wheelchair or bedside chair)?: Total ?Help needed to walk in hospital room?: Total ?Help needed climbing 3-5 steps with a railing? : Total ?6 Click Score: 8 ? ?  ?End of Session   ?Activity Tolerance: Patient tolerated treatment well ?Patient left: in  bed;with call bell/phone within reach;with bed alarm set ?Nurse Communication: Mobility status ?PT Visit Diagnosis: Muscle weakness (generalized) (M62.81) ?  ? ? ?Time: 1749-4496 ?PT Time Calculation (min) (ACUTE ONLY): 14 min ? ?Charges:  $Therapeutic Activity: 8-22 mins          ?          ? ?Lorrin Goodell, PT  ?Office # [phone] ?Pager [phone] ? ? ? ?Lorriane Shire ?03/13/2021, 12:14 PM ? ?

## 2021-03-13 NOTE — Progress Notes (Signed)
Speech Language Pathology Treatment: Dysphagia  ?Patient Details ?Name: Jason [name] ?MRN: [id number] ?DOB: 1945-10-03 ?Today's Date: 03/13/2021 ?Time: 7096-2836 ?SLP Time Calculation (min) (ACUTE ONLY): 11 min ? ?Assessment / Plan / Recommendation ?Clinical Impression ? Pt awake, just finished with RT who attempted, unsuccessfully to provide NTS however resulted in productive reflexive cough. He was unable to cough on command for this therapist and no effective throat clear. Initially, respirations were clear but quickly became congested with audible rattles. Ice, chip, tsp water and puree administered. No cough but question if swallow was completed. He held applesauce and did not propel despite verbal cues and dry spoons. Removed with eventual oral suction after delay and initial pt refusal. He is not ready to undergo instrumental testing with MBS but will recommend when he can consistently initiate swallow with all consistencies.  ?  ?HPI HPI: 76 yo m presenting from SNF 03/02/21 for SOB, fever and emesis since 03/07/21. On arrival he was found to be somnolent w/ rhonchus breathing, sats in low 70s. Immediately intubated by ED MD and found feculent emesis throughout oropharynx and airway. found to have incarcerated LIH with SBO and aspiration PNA s/p Left inguinal hernia repair with Ultrapro mesh Extubated 03/08/21 PMhx of dementia, bladder cancer ?  ?   ?SLP Plan ? Continue with current plan of care ? ?  ?  ?Recommendations for follow up therapy are one component of a multi-disciplinary discharge planning process, led by the attending physician.  Recommendations may be updated based on patient status, additional functional criteria and insurance authorization. ?  ? ?Recommendations  ?Diet recommendations: NPO ?Medication Administration: Via alternative means  ?   ?    ?   ? ? ? ? Oral Care Recommendations: Oral care QID ?Follow Up Recommendations: Skilled nursing-short term rehab (<3 hours/day) ?Assistance recommended at  discharge: Frequent or constant Supervision/Assistance ?SLP Visit Diagnosis: Dysphagia, oropharyngeal phase (R13.12) ?Plan: Continue with current plan of care ? ? ? ? ?  ?  ? ? ?Jason [name] ? ?03/13/2021, 9:01 AM ?

## 2021-03-13 NOTE — Progress Notes (Signed)
RT NOTES: Pt asleep. Will attempt CPT later. ?

## 2021-03-14 ENCOUNTER — Inpatient Hospital Stay (HOSPITAL_COMMUNITY): Payer: Medicare (Managed Care)

## 2021-03-14 DIAGNOSIS — J690 Pneumonitis due to inhalation of food and vomit: Secondary | ICD-10-CM | POA: Diagnosis not present

## 2021-03-14 DIAGNOSIS — J9601 Acute respiratory failure with hypoxia: Secondary | ICD-10-CM | POA: Diagnosis not present

## 2021-03-14 DIAGNOSIS — J9602 Acute respiratory failure with hypercapnia: Secondary | ICD-10-CM | POA: Diagnosis not present

## 2021-03-14 LAB — CBC WITH DIFFERENTIAL/PLATELET
Abs Immature Granulocytes: 0.07 10*3/uL (ref 0.00–0.07)
Basophils Absolute: 0 10*3/uL (ref 0.0–0.1)
Basophils Relative: 0 %
Eosinophils Absolute: 0.1 10*3/uL (ref 0.0–0.5)
Eosinophils Relative: 1 %
HCT: 35 % — ABNORMAL LOW (ref 39.0–52.0)
Hemoglobin: 11.3 g/dL — ABNORMAL LOW (ref 13.0–17.0)
Immature Granulocytes: 1 %
Lymphocytes Relative: 13 %
Lymphs Abs: 1.4 10*3/uL (ref 0.7–4.0)
MCH: 22 pg — ABNORMAL LOW (ref 26.0–34.0)
MCHC: 32.3 g/dL (ref 30.0–36.0)
MCV: 68.1 fL — ABNORMAL LOW (ref 80.0–100.0)
Monocytes Absolute: 1.2 10*3/uL — ABNORMAL HIGH (ref 0.1–1.0)
Monocytes Relative: 11 %
Neutro Abs: 8 10*3/uL — ABNORMAL HIGH (ref 1.7–7.7)
Neutrophils Relative %: 74 %
Platelets: 344 10*3/uL (ref 150–400)
RBC: 5.14 MIL/uL (ref 4.22–5.81)
RDW: 14.8 % (ref 11.5–15.5)
WBC: 10.8 10*3/uL — ABNORMAL HIGH (ref 4.0–10.5)
nRBC: 0 % (ref 0.0–0.2)

## 2021-03-14 LAB — GLUCOSE, CAPILLARY
Glucose-Capillary: 101 mg/dL — ABNORMAL HIGH (ref 70–99)
Glucose-Capillary: 108 mg/dL — ABNORMAL HIGH (ref 70–99)
Glucose-Capillary: 151 mg/dL — ABNORMAL HIGH (ref 70–99)
Glucose-Capillary: 187 mg/dL — ABNORMAL HIGH (ref 70–99)
Glucose-Capillary: 224 mg/dL — ABNORMAL HIGH (ref 70–99)
Glucose-Capillary: 91 mg/dL (ref 70–99)

## 2021-03-14 MED ORDER — ENOXAPARIN SODIUM 40 MG/0.4ML IJ SOSY
40.0000 mg | PREFILLED_SYRINGE | INTRAMUSCULAR | Status: DC
  Administered 2021-03-14 – 2021-03-17 (×4): 40 mg via SUBCUTANEOUS
  Filled 2021-03-14 (×4): qty 0.4

## 2021-03-14 MED ORDER — IPRATROPIUM-ALBUTEROL 0.5-2.5 (3) MG/3ML IN SOLN: 3.0000 mL | RESPIRATORY_TRACT | Status: DC | PRN

## 2021-03-14 NOTE — Progress Notes (Signed)
PT awake and alert. PT unable to do flutter. PT NT suctioned with RN at bedside. PT does resist suctioning. Pt remains on RA hr 87, sat 100%. No resp distress noted at this time.  ?

## 2021-03-14 NOTE — Progress Notes (Signed)
? ?[name], MRN:  [id number], DOB:  09-May-1945, LOS: 12 ?ADMISSION DATE:  03/02/2021, CONSULTATION DATE:  03/14/2021 ?REFERRING MD:  Jason Kindle, MD CHIEF COMPLAINT:  SOB  ? ?History of Present Illness:  ?Pt is a 76 yo m w/ PMhx of dementia, bladder cancer presenting from SNF for SOB and emesis since early Tuesday AM. On arrival he was found to be somnolent w/ rhonchus breathing, sats in low 70s. Immediately intubated by ED MD and found feculent emesis throughout oropharynx and airway. Upon OGT placement had ~ 1L of immediate output. Per reports pt had associated tachypnea, fever and emesis since early Tuesday AM as well. No family present.  ? ?CT ab/pelvis on my review concerning for pneumatosis & SBO involving left inguinal & scrotum. Final read pending. ED MD was able to reduce palpable anterior abdominal wall portion however scrotum remains full. ED nursing unable to pass foley despite multiple attempts.  ? ?On 3/11 patient was transferred to IMTS for continued care.  Over the next few days patient has been having trouble spitting his secretions, he is at high risk of aspiration and apparently he is slowly aspirating. ?On 3/14 rapid response was called, patient was noted to be drooling and his own secretions, fighting and swinging at the staff to avoid suctioning, he remained confused and encephalopathic, PCCM was consulted for help evaluation and management ? ?Patient's family was contacted about goals of care discussion, they would want to continue full scope of care including reintubation and proceeding to tracheostomy ? ?Pertinent  Medical History  ?Dementia  ?? Hx of Bladder cancer  ? ?Significant Hospital Events: ?Including procedures, antibiotic start and stop dates in addition to other pertinent events   ?3/2: admission for septic shock; central line and a-line placed ?3/3: decreased pressor and vent requirement. Echo with normal EF; G2DD. Moderate RV enlargement; P/F 153 ?3/4: off levo; on  vaso alone. P/F 186 ?3/5: off pressors ?3/6: ESBL e.coli on UC. Transitioned to [facility].  ?3/7: inguinal hernia repair, no immediate complications. LUE duplex negative for DVT. CXR>improving infiltrates ?3/8: extubated; feeds started, a-line removed ?3/9: Surgery signed off ?3/11: on room air ?Interim History / Subjective:  ? ?Reconsulted because of inability to protect his airway ? ?Objective   ?Blood pressure (!) 155/75, pulse 85, temperature 98.6 ?F (37 ?C), temperature source Axillary, resp. rate (!) 21, height '5\' 8"'$  (1.727 m), weight 72.6 kg, SpO2 98 %. ?   ?   ? ?Intake/Output Summary (Last 24 hours) at 03/14/2021 1836 ?Last data filed at 03/14/2021 1700 ?Gross per 24 hour  ?Intake --  ?Output 2553 ml  ?Net -2553 ml  ? ?Filed Weights  ? 03/12/21 0500 03/13/21 0500 03/14/21 1600  ?Weight: 74.8 kg 74.6 kg 72.6 kg  ? ? ?Examination: ?Physical exam: ?General: Acute on chronically ill-appearing male, lying on the bed ?HEENT: [place]/AT, eyes anicteric.  moist mucus membranes.  With gurgling secretions in the throat ?Neuro: Lethargic, aphasic, not following commands, combative  ?Chest: Tachypneic, bilateral basal crackles, no wheezes or rhonchi ?Heart: Regular rate and rhythm, no murmurs or gallops ?Abdomen: Soft, nontender, nondistended, bowel sounds present ?Skin: No rash ? ? ?Resolved Hospital Problem list   ?Septic shock secondary to aspiration pna vs e.coli UTI ?ARDS ?Hypernatremia ?SBO--started passing stool 3/9 ?Inguinal hernia--s/p repair 3/7 ?AKI--resolved 3/11 ?ESBL E.coli UTI ? ?Assessment & Plan:  ?Acute hypoxic/hypercapnic respiratory failure ?Recurrent aspiration pneumonia ?Patient completed antibiotic therapy on 3/13 with meropenem ?He continues to be at high risk of  aspiration in fact he is slowly aspirating, unable to protect his airway ?He is combative when NT suctioning is done ?Very high risk of endotracheal intubation ?Palliative care is consulted, needs goals of care discussion considering he has  advanced dementia and if he gets intubated we will proceed with tracheostomy ?I will hold off on further antibiotics ?We will continue oxygen with O2 sat goal 92% ?Patient will be moved to ICU for close monitoring until further goals of care discussion has been carried, in the meantime he remains full code ? ? ? ?Moderate Malnutrition ?-SLP eval to see if we can start transitioning him to a diet.  ?-Continue TF for now ? 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 ?Transient atrial fibrillation.  ?In the setting of sepsis, resolved ?Patient remained in sinus rhythm ? ?Advanced dementia with acute metabolic encephalopathy due to acute illness ?Avoid sedation ?Continue supportive care ?At baseline ambulates, AAOx1. Needs ADL assistance and minimally communicative   ?Delirium precautions ? ? ?Best Practice (right click and "Reselect all SmartList Selections" daily)  ? ?Diet/type: tubefeeds ?DVT prophylaxis: Subcu Lovenox ?GI prophylaxis: PPI ?Lines: N/A ?Foley:  Yes, and it is still needed ?Code Status:  full code ? ? ? ? ?Total critical care time: 33 minutes ? ?Performed by: Jason [name] ?  ?Critical care time was exclusive of separately billable procedures and treating other patients. ?  ?Critical care was necessary to treat or prevent imminent or life-threatening deterioration. ?  ?Critical care was time spent personally by me on the following activities: development of treatment plan with patient and/or surrogate as well as nursing, discussions with consultants, evaluation of patient's response to treatment, examination of patient, obtaining history from patient or surrogate, ordering and performing treatments and interventions, ordering and review of laboratory studies, ordering and review of radiographic studies, pulse oximetry and re-evaluation of patient's condition. ?  ?Jason Kindle MD ?[facility] ?See Amion for pager ?If no response to pager, please call [phone] until 7pm ?After 7pm, Please call E-link [phone] ? ?

## 2021-03-14 NOTE — Progress Notes (Signed)
Speech Language Pathology Treatment: Dysphagia  ?Patient Details ?Name: Jason [name] ?MRN: [id number] ?DOB: [birth year] ?Today's Date: 03/14/2021 ?Time: 2094-7096 ?SLP Time Calculation (min) (ACUTE ONLY): 8 min ? ?Assessment / Plan / Recommendation ?Clinical Impression ? Pt is not progressing well toward swallow goals unfortunately. On arrival audible respirations present. He opened mouth on command revealing excessive amounts of dried secretions on posterior tongue, soft palate (webbing). He accepted ice chip in attempts to loosen mucous without swallow present. Therapist attempted to used [place] to remove and touched left arm to remind not to pull [place] out. Pt turning head to left and lifting arm as if to hit therapist x 2. He then coughed/gagged and brought secretions to oral cavity. SLP gave pt the suction to use but he just put it back down on the bed. He currently is unable to consume po's or participate in instrumental assessment and may need longer term means of nutrition until able to do so. ST will plan to follow up Thursday.  ?  ?HPI HPI: 76 yo m presenting from SNF 03/02/21 for SOB, fever and emesis since 03/07/21. On arrival he was found to be somnolent w/ rhonchus breathing, sats in low 70s. Immediately intubated by ED MD and found feculent emesis throughout oropharynx and airway. found to have incarcerated LIH with SBO and aspiration PNA s/p Left inguinal hernia repair with Ultrapro mesh Extubated 03/08/21 PMhx of dementia, bladder cancer ?  ?   ?SLP Plan ? Continue with current plan of care ? ?  ?  ?Recommendations for follow up therapy are one component of a multi-disciplinary discharge planning process, led by the attending physician.  Recommendations may be updated based on patient status, additional functional criteria and insurance authorization. ?  ? ?Recommendations  ?Diet recommendations: NPO ?Medication Administration: Via alternative means  ?   ?    ?   ? ? ? ? Oral Care Recommendations: Oral  care QID ?Follow Up Recommendations: Skilled nursing-short term rehab (<3 hours/day) ?Assistance recommended at discharge: Frequent or constant Supervision/Assistance ?SLP Visit Diagnosis: Dysphagia, oropharyngeal phase (R13.12) ?Plan: Continue with current plan of care ? ? ? ? ?  ?  ? ? ?[name] ? ?03/14/2021, 3:48 PM ?

## 2021-03-14 NOTE — Progress Notes (Signed)
Respiratory at bedside for chest PT therapy. Patient having increase work of breathing. Respiratory called Rapid. RN call primary team to inform them that a rapid is being called on this patient.  ?Patient stating 100% on room air the entire time. Attempted to suction secretions out, patient combative trying to hit staff. ? Jimmye Norman, MD at bedside. Hold tube feedings for now. CXR ordered. Provider to speak with family at plan of care. ?

## 2021-03-14 NOTE — Progress Notes (Signed)
RT came to patient room to assess patient.  This AM, rapid response was called due patient not being able to protect his airway.  Patient becomes combative when attempting to suction.  Patient also noted to have prolonged expiratory phase.  MDs came to patient room and it was discussed that family would be reached to determine goals of care.  Upon this afternoon assessment, patient noted to still not be able to protect his airway.  Work of breathing has slightly increased as well.  Sats currently at 98%.  MD paged to discuss RTs concern in regards to needing higher level of care.  MD stated will speak with upper level and will call RT back.  RT will continue to monitor.  ?

## 2021-03-14 NOTE — Plan of Care (Signed)
PACE spoke to daughter, and she still wants to pursue full code. She wants to involve palliative care. As well, she'd like to have lower rate tube feeds or consider PEG tube placement.  ? ?RT paged and informed that patient is not allowing for suctioning and recommended PCCM consult as patient needs a higher level of care since he is not able to protect his airway and refusing proper oral care. Will discontinue tube feeds at this time and reach out to PCCM to inquire about taking him overnight for closer observation.   ? ?Rosezetta Schlatter, MD ?Internal Medicine/ Psych Intern ?PGY-1 ?03/14/2021 4:49 PM ?Pager: [phone] ?After 5pm on weekdays and 1pm on weekends: On Call pager [phone]   ?

## 2021-03-14 NOTE — Progress Notes (Addendum)
? ?Subjective: Jason [name]. ? ?Rapid response called this AM due to respiratory distress and increased secretions that patient is unable to clear. RT performed chest PT including clearing secretions by suction, though patient is resistive. Patient O2 sats 100% on RA during this time. ? ?Called [facility] to discuss goals of care, spoke to Jason [name], Therapist, sports. [facility] will be able to accommodate in nursing home. They are aware of his condition and able to take him back when d/c. Jason [name] will be provider following while there. Ready for discharge when we are.  ? ?Unable to manage Cortrak at [facility]; [facility] to have conversation with daughter about goals of care and will coordinate with our team (plan of care note to follow to document conversation). Spoke to daughter with AM inpatient updates. She was appreciative of the call and in agreement with plan to return to [facility].  ? ?Objective: ? ?Vital signs in last 24 hours: ?Vitals:  ? 03/13/21 1956 03/13/21 2006 03/13/21 2349 03/14/21 0406  ?BP: (!) 160/77  (!) 156/75 (!) 150/70  ?Pulse: 80 80 80 76  ?Resp: '20 20 20   '$ ?Temp: 98.8 ?F (37.1 ?C)  99 ?F (37.2 ?C) 99 ?F (37.2 ?C)  ?TempSrc:   Axillary Axillary  ?SpO2: 100% 100% 100% 100%  ?Weight:      ?Height:      ? ?Physical Exam: ?General: Chronically ill-appearing male with Cortrak in place, in respiratory distress. ?HENT: normocephalic, atraumatic, external nares and ears appear normal.  ?EYES: conjunctiva non-erythematous ?CV: regular rate, normal rhythm on telemetry ?Pulmonary:  Significant large airway congestion audible w/o stethescope; increased work of breathing using accessory muscles on RA; increased white secretions visible inside mouth. O2 sats 100% on RA. ?Skin: Warm and dry.   ?Neurological: Awake, alert, appears confused and anxious;  not oriented to place, situation, or time.  Unsure if patient was oriented to person, but responds to name by looking toward speaker.  ?Psych: Confused and unable  to follow commands; becomes combative, swatting when attempting to suction/touch face. ? ?Assessment/Plan: ? ?Principal Problem: ?  Small bowel obstruction ([place]) ?Active Problems: ?  Septic shock ([place]) ?  Malnutrition of moderate degree ? ?Jason [name] is a 76 year old male with PMHx advanced dementia, COPD, and bladder cancer s/p resection who comes to our team from ICU after management of acute hypoxic and hypercapnic respiratory failure due to aspiration GNR pneumonia, sustained as a result of SBO from an incarcerated L inguinal hernia (now surgically repaired).  ? ?#ESBL E.Coli UTI ?Urine culture 3/02 showed ESBL E. coli ?WBC downtrending (10.8<-11.1<-12.2), and patient remains afebrile. Completed meropenem 3/13 ?  ?# Aspiration risk 2/2 excess secretions ?Acute hypoxic and hypercapnic respiratory failure (resolved) from Klebsiella aspiration pneumonia, aspiration from SBO 2/2 incarcerated bowel inguinal hernia s/p repair ?Patient is now on room air, but has excessive secretions with ineffective cough. Will discontinue tube feeds, as patient unable to have Cortrak at [facility]. Follow up with [facility] after discussion with daughter for the best nutritional plan of care. In setting of dementia, hand feeding for comfort is safest option; PEGs unfortunately are beset with complications and do not prevent aspiration. ?- RT on board; performed chest physiotherapy though his dementia interferes with cooperation, and the frequency of potential treatment is very high and unsustainable. ?- Aspiration precautions ?- Continue oral care  ?-Continue guaifenesin 66m every 6 hours ?-Surgery signed off on 3/09 and will arrange follow-up patient ?  ?#Bulbar urethra stricture ?Patient  with history of bladder cancer. While in the ICU (on 3/2), urology was consulted to place Foley catheter with recommendations to continue Foley catheter until patient was able to express painful inability to urinate. Patient is followed by Dr.  Louis Meckel outpatient.  Spoke to Dr. Cain Sieve on 3/13 who recommended leaving catheter in place and following up in clinic in 1 week for voiding trial vs. exchanging catheter. ?  ?#Paroxysmal Atrial Fibrillation, resolved ?Paroxysmal A-fib in setting of sepsis. Patient remains in normal sinus rhythm x 3 days. Eliquis and amiodarone discontinued 3/13. Telemetry monitoring discontinued. ?  ?#Dementia ?Patient is minimally verbal at baseline, sometimes able to say his name. He has lived in [place] for many years. ?  ?#Diet ?SLP is following but patient has not shown that he can clear his airway. Patient currently has tube feeds stopped because of aspiration risk and inability to have NG tube managed at [facility].  ?  ?#Diarrhea ?C Diff negative, and patient not on laxatives so presumed from tube feeds. RD advised that patient's Jevity includes fiber (though tube feedings have been stopped; monitor effect on bowels) ?- Monitor electrolytes with BMP.  ?  ?#Microcytic Anemia ?Iron 15, TIBC 140, Saturation 11. ?- Will hold off transfusion due to infection ?  ? ?Prior to Admission Living Arrangement: [place]  ?Anticipated Discharge Location: [name] facility ?Barriers to Discharge: Completing treatment ?Dispo: Anticipated discharge in approximately 1-3 day(s).  ? ?Jason Schlatter, MD ?03/14/2021, 6:42 AM ?Pager: [phone] ?After 5pm on weekdays and 1pm on weekends: On Call pager [phone]  ?

## 2021-03-14 NOTE — Progress Notes (Signed)
Occupational Therapy Treatment ?Patient Details ?Name: Jason [name] ?MRN: [id number] ?DOB: July 03, 1945 ?Today's Date: 03/14/2021 ? ? ?History of present illness 76 yo m presenting from SNF 03/09/21 for SOB, fever and emesis since 03/07/21. On arrival he was found to be somnolent w/ rhonchus breathing, sats in low 70s. Immediately intubated by ED MD and found feculent emesis throughout oropharynx and airway. found to have incarcerated LIH with SBO and aspiration PNA s/p Left inguinal hernia repair with Ultrapro mesh Extubated 03/08/21 PMhx of dementia, bladder cancer ?  ?OT comments ? Pt making slow progress. Pt up to EOB this am and sat with mod assist. Pt tends to lean to the left and likes to prop on L elbow.  Pt following appx 50% of very simple commands. When given step by step instructions of what is coming next, pt was very agreeable to therapy. ?  ? ?Recommendations for follow up therapy are one component of a multi-disciplinary discharge planning process, led by the attending physician.  Recommendations may be updated based on patient status, additional functional criteria and insurance authorization. ?   ?Follow Up Recommendations ? Skilled nursing-short term rehab (<3 hours/day)  ?  ?Assistance Recommended at Discharge Frequent or constant Supervision/Assistance  ?Patient can return home with the following ? Two people to help with walking and/or transfers;Two people to help with bathing/dressing/bathroom ?  ?Equipment Recommendations ? None recommended by OT  ?  ?Recommendations for Other Services   ? ?  ?Precautions / Restrictions Precautions ?Precautions: Fall ?Precaution Comments: Cortrak, L inguinal hernia incision glued shut, can be agressive/combative ?Restrictions ?Weight Bearing Restrictions: No  ? ? ?  ? ?Mobility Bed Mobility ?Overal bed mobility: Needs Assistance ?Bed Mobility: Rolling, Sidelying to Sit, Sit to Supine ?Rolling: Max assist ?Sidelying to sit: Mod assist, +2 for physical assistance ?  ?Sit  to supine: Mod assist, +2 for physical assistance ?  ?General bed mobility comments: Therapist gave step by step instructions of what was coming next with mobility and pt cooperated fully ?  ? ?Transfers ?  ?  ?  ?  ?  ?  ?  ?  ?  ?General transfer comment: did not progress beyond EOB ?  ?  ?Balance Overall balance assessment: Needs assistance ?Sitting-balance support: Feet supported, Single extremity supported ?Sitting balance-Leahy Scale: Poor ?Sitting balance - Comments: Pt with heavy L lean. Pt pushing self to the left. ?Postural control: Left lateral lean ?  ?  ?Standing balance comment: unable to stand ?  ?  ?  ?  ?  ?  ?  ?  ?  ?  ?  ?   ? ?ADL either performed or assessed with clinical judgement  ? ?ADL Overall ADL's : Needs assistance/impaired ?Eating/Feeding: Total assistance ?  ?Grooming: Total assistance ?  ?Upper Body Bathing: Total assistance ?  ?Lower Body Bathing: Total assistance ?  ?Upper Body Dressing : Total assistance ?  ?Lower Body Dressing: Total assistance ?  ?  ?  ?Toileting- Clothing Manipulation and Hygiene: Total assistance;+2 for physical assistance ?Toileting - Clothing Manipulation Details (indicate cue type and reason): Pt rolled side to side with max assist to be cleaned in the bed ?  ?  ?Functional mobility during ADLs: +2 for physical assistance ?General ADL Comments: Pt with baseline of impaired cognition.  Pt limited in following command to come to the side of the bed but was able to sit EOB today. ?  ? ?Extremity/Trunk Assessment Upper Extremity Assessment ?Upper Extremity Assessment: RUE deficits/detail;LUE  deficits/detail ?RUE Deficits / Details: squeezed hand and would use in normal movement patterns but not much to command. ?RUE Sensation: WNL ?LUE Deficits / Details: Pt with what appears to be increased tone in this side. Did use it to hold to bedrail while being cleaned up from bowel movement. ?LUE Coordination: decreased fine motor;decreased gross motor ?  ?Lower  Extremity Assessment ?Lower Extremity Assessment: Defer to PT evaluation ?  ?  ?  ? ?Vision   ?  ?  ?Perception   ?  ?Praxis Praxis ?Praxis: Not tested ?  ? ?Cognition Arousal/Alertness: Awake/alert ?Behavior During Therapy: Flat affect ?Overall Cognitive Status: History of cognitive impairments - at baseline ?  ?  ?  ?  ?  ?  ?  ?  ?  ?  ?  ?  ?  ?  ?  ?  ?General Comments: Oriented to self.  Followed appx 50% of simple one step commands ?  ?  ?   ?Exercises   ? ?  ?Shoulder Instructions   ? ? ?  ?General Comments Pt with skin tear on scrotum. Pt cleaned from bowel movement and cream but on area of breakdown.  ? ? ?Pertinent Vitals/ Pain       Pain Assessment ?Pain Assessment: Faces ?Faces Pain Scale: Hurts a little bit ?Pain Location: generalized ?Pain Descriptors / Indicators: Grimacing ?Pain Intervention(s): Limited activity within patient's tolerance, Monitored during session, Repositioned ? ?Home Living   ?  ?  ?  ?  ?  ?  ?  ?  ?  ?  ?  ?  ?  ?  ?  ?  ?  ?  ? ?  ?Prior Functioning/Environment    ?  ?  ?  ?   ? ?Frequency ? Min 2X/week  ? ? ? ? ?  ?Progress Toward Goals ? ?OT Goals(current goals can now be found in the care plan section) ? Progress towards OT goals: Progressing toward goals ? ?Acute Rehab OT Goals ?OT Goal Formulation: Patient unable to participate in goal setting ?Time For Goal Achievement: 03/24/21 ?Potential to Achieve Goals: Fair ?ADL Goals ?Pt Will Perform Grooming: with max assist;sitting;bed level ?Pt/caregiver will Perform Home Exercise Program: Both right and left upper extremity;With minimal assist ?Additional ADL Goal #1: Pt will complete bed mobility at max A +1 level to facilite pericare.  ?Plan Discharge plan remains appropriate   ? ?Co-evaluation ? ? ?   ?  ?  ?  ?  ? ?  ?AM-PAC OT "6 Clicks" Daily Activity     ?Outcome Measure ? ? Help from another person eating meals?: Total ?Help from another person taking care of personal grooming?: Total ?Help from another person toileting,  which includes using toliet, bedpan, or urinal?: Total ?Help from another person bathing (including washing, rinsing, drying)?: Total ?Help from another person to put on and taking off regular upper body clothing?: Total ?Help from another person to put on and taking off regular lower body clothing?: Total ?6 Click Score: 6 ? ?  ?End of Session   ? ?OT Visit Diagnosis: Other abnormalities of gait and mobility (R26.89);Unsteadiness on feet (R26.81);Muscle weakness (generalized) (M62.81);Other symptoms and signs involving cognitive function;Pain ?  ?Activity Tolerance No increased pain ?  ?Patient Left in bed;with call bell/phone within reach;with SCD's reapplied;with bed alarm set ?  ?Nurse Communication Mobility status ?  ? ?   ? ?Time: 3419-3790 ?OT Time Calculation (min): 25 min ? ?Charges: OT General Charges ?$  OT Visit: 1 Visit ?OT Treatments ?$Self Care/Home Management : 23-37 mins ? ? ? ?Glenford Peers ?03/14/2021, 11:57 AM ?

## 2021-03-14 NOTE — Progress Notes (Addendum)
Patient with audible congestion.  Lung sounds with scatter rhonchi and upper airway congestion.  Patient does not cough on command and become very combative with oral suctioning.  Unable to suction patient without restraining right arm.  Patient also clamps his mouth shut refusing oral care. ?TF have been discontinued by attending. ?Awaiting goals of care conversation today per attending. ? ?BP 159/77  HR 83  RR 20  O2 sat 100% on RA ? ?

## 2021-03-14 NOTE — Progress Notes (Signed)
Report given to [facility] RN. Will get patient ready for transport to unit.  ?

## 2021-03-15 ENCOUNTER — Inpatient Hospital Stay (HOSPITAL_COMMUNITY): Payer: Medicare (Managed Care)

## 2021-03-15 DIAGNOSIS — Z515 Encounter for palliative care: Secondary | ICD-10-CM | POA: Diagnosis not present

## 2021-03-15 DIAGNOSIS — T17908A Unspecified foreign body in respiratory tract, part unspecified causing other injury, initial encounter: Secondary | ICD-10-CM | POA: Diagnosis present

## 2021-03-15 DIAGNOSIS — K56609 Unspecified intestinal obstruction, unspecified as to partial versus complete obstruction: Secondary | ICD-10-CM | POA: Diagnosis not present

## 2021-03-15 DIAGNOSIS — Z7189 Other specified counseling: Secondary | ICD-10-CM

## 2021-03-15 DIAGNOSIS — J9601 Acute respiratory failure with hypoxia: Secondary | ICD-10-CM | POA: Diagnosis not present

## 2021-03-15 LAB — CBC WITH DIFFERENTIAL/PLATELET
Abs Immature Granulocytes: 0.04 10*3/uL (ref 0.00–0.07)
Basophils Absolute: 0 10*3/uL (ref 0.0–0.1)
Basophils Relative: 0 %
Eosinophils Absolute: 0.1 10*3/uL (ref 0.0–0.5)
Eosinophils Relative: 1 %
HCT: 33.5 % — ABNORMAL LOW (ref 39.0–52.0)
Hemoglobin: 10.6 g/dL — ABNORMAL LOW (ref 13.0–17.0)
Immature Granulocytes: 0 %
Lymphocytes Relative: 17 %
Lymphs Abs: 1.6 10*3/uL (ref 0.7–4.0)
MCH: 21.8 pg — ABNORMAL LOW (ref 26.0–34.0)
MCHC: 31.6 g/dL (ref 30.0–36.0)
MCV: 68.9 fL — ABNORMAL LOW (ref 80.0–100.0)
Monocytes Absolute: 0.9 10*3/uL (ref 0.1–1.0)
Monocytes Relative: 9 %
Neutro Abs: 6.6 10*3/uL (ref 1.7–7.7)
Neutrophils Relative %: 73 %
Platelets: 322 10*3/uL (ref 150–400)
RBC: 4.86 MIL/uL (ref 4.22–5.81)
RDW: 15 % (ref 11.5–15.5)
WBC: 9.2 10*3/uL (ref 4.0–10.5)
nRBC: 0 % (ref 0.0–0.2)

## 2021-03-15 LAB — GLUCOSE, CAPILLARY
Glucose-Capillary: 121 mg/dL — ABNORMAL HIGH (ref 70–99)
Glucose-Capillary: 126 mg/dL — ABNORMAL HIGH (ref 70–99)
Glucose-Capillary: 128 mg/dL — ABNORMAL HIGH (ref 70–99)
Glucose-Capillary: 130 mg/dL — ABNORMAL HIGH (ref 70–99)
Glucose-Capillary: 135 mg/dL — ABNORMAL HIGH (ref 70–99)
Glucose-Capillary: 138 mg/dL — ABNORMAL HIGH (ref 70–99)
Glucose-Capillary: 95 mg/dL (ref 70–99)

## 2021-03-15 LAB — COMPREHENSIVE METABOLIC PANEL
ALT: 20 U/L (ref 0–44)
AST: 20 U/L (ref 15–41)
Albumin: 1.9 g/dL — ABNORMAL LOW (ref 3.5–5.0)
Alkaline Phosphatase: 90 U/L (ref 38–126)
Anion gap: 8 (ref 5–15)
BUN: 27 mg/dL — ABNORMAL HIGH (ref 8–23)
CO2: 20 mmol/L — ABNORMAL LOW (ref 22–32)
Calcium: 8.4 mg/dL — ABNORMAL LOW (ref 8.9–10.3)
Chloride: 109 mmol/L (ref 98–111)
Creatinine, Ser: 1.26 mg/dL — ABNORMAL HIGH (ref 0.61–1.24)
GFR, Estimated: 59 mL/min — ABNORMAL LOW (ref >60)
Glucose, Bld: 102 mg/dL — ABNORMAL HIGH (ref 70–99)
Potassium: 4.2 mmol/L (ref 3.5–5.1)
Sodium: 137 mmol/L (ref 135–145)
Total Bilirubin: 0.7 mg/dL (ref 0.3–1.2)
Total Protein: 7 g/dL (ref 6.5–8.1)

## 2021-03-15 LAB — PHOSPHORUS: Phosphorus: 4 mg/dL (ref 2.5–4.6)

## 2021-03-15 LAB — MAGNESIUM: Magnesium: 1.7 mg/dL (ref 1.7–2.4)

## 2021-03-15 MED ORDER — PROSOURCE TF PO LIQD
45.0000 mL | Freq: Two times a day (BID) | ORAL | Status: DC
Start: 2021-03-15 — End: 2021-03-17
  Administered 2021-03-15 – 2021-03-17 (×5): 45 mL
  Filled 2021-03-15 (×5): qty 45

## 2021-03-15 MED ORDER — JEVITY 1.5 CAL/FIBER PO LIQD
1000.0000 mL | ORAL | Status: DC
  Administered 2021-03-15 – 2021-03-17 (×2): 1000 mL
  Filled 2021-03-15 (×3): qty 1000

## 2021-03-15 NOTE — Plan of Care (Signed)
03/14/2021 ? ?1725: Received page from RN about concern for dried secretions and continued aspirations in patient. RT unable to suction secretions given patient's agitation and constant movements to swat away suctioning attempts. Requested closer monitoring due to concern for airway compromise. ? ?1728: Discussed with PCCM for closer monitoring. Spoke with Dr. Tacy Learn who was able to suction Jason [name]'s secretions. Discussed that he is at continued high risk for aspiration and airway compromise. Discussed that we would need to speak with daughter and determine GOC as options going forward included intubation and trach placement for airway protection or proceeding with comfort measures. ? ?1735: Discussed with daughter about GOC. Daughter wished to proceed with full measures. Discussed risks and benefits of trach placement. Daughter confirmed understanding and decided to pursue with trach placement if needed.  ? ?1752: Discussed conversation with Dr. Tacy Learn. PCCM to take over care. ?

## 2021-03-15 NOTE — Progress Notes (Signed)
? ?[name], MRN:  [id number], DOB:  1945/07/04, LOS: 13 ?ADMISSION DATE:  03/02/2021, CONSULTATION DATE:  03/15/2021 ?REFERRING MD:  Collene Gobble, MD CHIEF COMPLAINT:  SOB  ? ?History of Present Illness:  ?Pt is a 76 yo m w/ PMhx of dementia, bladder cancer presenting from SNF for SOB and emesis since early Tuesday AM. On arrival he was found to be somnolent w/ rhonchus breathing, sats in low 70s. Immediately intubated by ED MD and found feculent emesis throughout oropharynx and airway. Upon OGT placement had ~ 1L of immediate output. Per reports pt had associated tachypnea, fever and emesis since early Tuesday AM as well. No family present.  ? ?CT ab/pelvis on my review concerning for pneumatosis & SBO involving left inguinal & scrotum. Final read pending. ED MD was able to reduce palpable anterior abdominal wall portion however scrotum remains full. ED nursing unable to pass foley despite multiple attempts.  ? ?On 3/11 patient was transferred to [facility]MT[facility] for continued care.  Over the next few days patient has been having trouble spitting his secretions, he is at high risk of aspiration and apparently he is slowly aspirating. ?On 3/14 rapid response was called, patient was noted to be drooling and his own secretions, fighting and swinging at the staff to avoid suctioning, he remained confused and encephalopathic, PCCM was consulted for help evaluation and management ? ?Patient's family was contacted about goals of care discussion, they would want to continue full scope of care including reintubation and proceeding to tracheostomy ? ?Pertinent  Medical History  ?Dementia  ?? Hx of Bladder cancer  ? ?Significant Hospital Events: ?Including procedures, antibiotic start and stop dates in addition to other pertinent events   ?3/2: admission for septic shock; central line and a-line placed ?3/3: decreased pressor and vent requirement. Echo with normal EF; G2DD. Moderate RV enlargement; P/F 153 ?3/4: off levo; on  vaso alone. P/F 186 ?3/5: off pressors ?3/6: ESBL e.coli on UC. Transitioned to [facility].  ?3/7: inguinal hernia repair, no immediate complications. LUE duplex negative for DVT. CXR>improving infiltrates ?3/8: extubated; feeds started, a-line removed ?3/9: Surgery signed off ?3/11: on room air ?3/14 had recurrent aspiration and respirator distress, was fighting at suction attempts, transferred to ICU for closer monitoring and concern would require intubation ? ?Interim History / Subjective:  ?Has weak cough this AM.  Does let me orally suction and able to suction out thin secretions. ?Does not tolerate NTS and pushes me away. ?Able to answer very basic questions, moves all extremities. ? ?Objective   ?Blood pressure (!) 146/75, pulse 78, temperature 98.5 ?F (36.9 ?C), temperature source Axillary, resp. rate (!) 26, height '5\' 8"'$  (1.727 m), weight 72.6 kg, SpO2 100 %. ?   ?   ? ?Intake/Output Summary (Last 24 hours) at 03/15/2021 0837 ?Last data filed at 03/15/2021 0503 ?Gross per 24 hour  ?Intake 200 ml  ?Output 1875 ml  ?Net -1675 ml  ? ?Filed Weights  ? 03/12/21 0500 03/13/21 0500 03/14/21 1600  ?Weight: 74.8 kg 74.6 kg 72.6 kg  ? ? ?Examination: ?General: Adult male, resting in bed, in NAD. ?Neuro: Awake, tracks, answers basic questions, MAE's. ?HEENT: [place]/AT. Sclerae anicteric. EOMI. ?Cardiovascular: RRR, no M/R/G. ?Lungs: Respirations even and unlabored.  CTA bilaterally, No W/R/R. Weak cough, does have intact gag with suctioning and tries to cough as well. ?Abdomen: BS x 4, soft, NT/ND. ?Musculoskeletal: No gross deformities, no edema. ?Skin: Intact, warm, no rashes. ? ?Resolved Hospital Problem list   ?  Septic shock secondary to aspiration pna vs e.coli UTI ?ARDS ?Hypernatremia ?SBO--started passing stool 3/9 ?Inguinal hernia--s/p repair 3/7 ?AKI--resolved 3/11 ?ESBL E.coli UTI ? ?Assessment & Plan:  ? ?Acute hypoxic/hypercapnic respiratory failure ?Recurrent aspiration pneumonia - s/p course Meropenem completed  3/13. He continues to have intermittent occult aspiration with inability to clear secretions effectively.  He does allow and cooperate with oral suctioning; however, will not tolerate or cooperate with NTS. ?- Continue supplemental O2 as needed to maintain SpO2 > 92%. ?- Needs ongoing goals of care as if he reaches point of intubation, he will require tracheostomy and placement. ?- Continue close monitoring and await palliative care meeting with family today 3/15. PMT assistance greatly appreciated. ?- Continue Guaifenesin. ? ?Moderate Malnutrition - NPO for now given his recurrent aspiration (holding TF's). ?- F/u on PMT discussion with family and if continued full scope of treatment then will need PEG though not ideal given his advanced dementia. ? 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 ?- Voiding trial as an outpatient per urology.  ? ?Transient atrial fibrillation - resolved. ?- Supportive care. ? ?Advanced dementia with acute metabolic encephalopathy due to acute illness. At baseline ambulates, AAOx1. Needs ADL assistance and minimally communicative. ?- Continue supportive care. ?- Avoid sedation. ?- Delirium precautions ? ?Microcytic anemia with low iron and TIBC. ?- Supplement once decision on PEG etc made. ? ?ESBL E.coli UTI - s/p course of Merrem. ?- No further interventions required. ? ? ?Best Practice (right click and "Reselect all SmartList Selections" daily)  ? ?Diet/type: NPO ?DVT prophylaxis: Subcu Lovenox ?GI prophylaxis: H2B ?Lines: N/A ?Foley:  Yes, and it is still needed ?Code Status:  full code - PMT to meet with family today 3/15 for further goals of care.  Recommend DNR/DNI with no intubation / trach given his underlying dementia etc. ? ?CC time: 30 min. ? ? ?Jason Hora, PA - C ?[facility] ?For pager details, please see AMION or use Epic chat  ?After 1900, please call [facility] for cross coverage needs ?03/15/2021,  8:37 AM ? ?

## 2021-03-15 NOTE — Progress Notes (Signed)
Physical Therapy Treatment ?Patient Details ?Name: Jason [name] ?MRN: [id number] ?DOB: Apr 20, 1945 ?Today's Date: 03/15/2021 ? ? ?History of Present Illness 76 yo m presenting from SNF 03/09/21 for SOB, fever and emesis since 03/07/21. On arrival he was found to be somnolent w/ rhonchus breathing, sats in low 70s. Immediately intubated by ED MD and found feculent emesis throughout oropharynx and airway. found to have incarcerated LIH with SBO and aspiration PNA s/p Left inguinal hernia repair with Ultrapro mesh Extubated 03/08/21 PMhx of dementia, bladder cancer ? ?  ?PT Comments  ? ? Pt with increased L lateral lean in supine with head pushed up against bed rail. Provided pt with simple one step commands that he was able to follow approx 50% of the time. Requires increased time for response and sequencing. Able to direct pt to EoB, with modA for bringing trunk to upright. Pt sat EoB of 20 min for exercise and RN to administer medication. RN requested return to bed for [facility] placement. Pt able to return to supine with modA for pulling hips back in bed and for positioning. D/c plan remains appropriate at this time. PT will continue to follow acutely.   ?Recommendations for follow up therapy are one component of a multi-disciplinary discharge planning process, led by the attending physician.  Recommendations may be updated based on patient status, additional functional criteria and insurance authorization. ? ?Follow Up Recommendations ? Skilled nursing-short term rehab (<3 hours/day) ?  ?  ?Assistance Recommended at Discharge Frequent or constant Supervision/Assistance  ?Patient can return home with the following   ?  ?Equipment Recommendations ? None recommended by PT  ?  ?   ?Precautions / Restrictions Precautions ?Precautions: Fall ?Precaution Comments: Cortrak, L inguinal hernia incision glued shut, can be agressive/combative ?Restrictions ?Weight Bearing Restrictions: No  ?  ? ?Mobility ? Bed Mobility ?Overal bed mobility:  Needs Assistance ?Bed Mobility: Supine to Sit, Sit to Supine ?  ?  ?Supine to sit: Mod assist, HOB elevated ?Sit to supine: Mod assist, HOB elevated ?  ?General bed mobility comments: very slowed action however able to follow commands for holding onto rail to assist in coming to EoB and for management of LE off bed. Pt requires assist to come to upright on EoB, pt wirh L sided lean at base line able to keep upright posture for approx 20 sec before leaning over on his L elbow ?  ? ?Transfers ?  ?  ?  ?  ?  ?  ?  ?  ?  ?General transfer comment: did not progress beyond EOB, Panda technician present and request back to bed ?  ? ? ?  ? ? ?  ?Balance Overall balance assessment: Needs assistance ?Sitting-balance support: Feet supported, Single extremity supported ?Sitting balance-Leahy Scale: Poor ?Sitting balance - Comments: continues to have increased L lateral lean able to compensate with initially sitting up but quickly fatigues and requires leaning on L elbow or PT sat beside him for support while working on LE exercise ?Postural control: Left lateral lean ?  ?  ?  ?  ?  ?  ?  ?  ?  ?  ?  ?  ?  ?  ?  ? ?  ?Cognition Arousal/Alertness: Awake/alert ?Behavior During Therapy: Flat affect ?Overall Cognitive Status: History of cognitive impairments - at baseline ?  ?  ?  ?  ?  ?  ?  ?  ?  ?  ?  ?  ?  ?  ?  ?  ?  General Comments: oriented to self, followed 50% of single step commands with extremely increased time ?  ?  ? ?  ?Exercises General Exercises - Lower Extremity ?Ankle Circles/Pumps: AROM, Both, 10 reps, Seated ?Long Arc Quad: AROM, Both, Seated, 5 reps ? ?  ?General Comments General comments (skin integrity, edema, etc.): VSS on RA, able to sit while RN administered meds ?  ?  ? ?Pertinent Vitals/Pain Pain Assessment ?Pain Assessment: Faces ?Faces Pain Scale: Hurts a little bit ?Pain Location: generalized ?Pain Descriptors / Indicators: Grimacing ?Pain Intervention(s): Limited activity within patient's tolerance,  Monitored during session, Repositioned  ? ? ? ?PT Goals (current goals can now be found in the care plan section) Acute Rehab PT Goals ?Patient Stated Goal: none stated ?PT Goal Formulation: Patient unable to participate in goal setting ?Time For Goal Achievement: 03/23/21 ?Potential to Achieve Goals: Fair ?Progress towards PT goals: Progressing toward goals ? ?  ?Frequency ? ? ? Min 2X/week ? ? ? ?  ?PT Plan Current plan remains appropriate  ? ? ?   ?AM-PAC PT "6 Clicks" Mobility   ?Outcome Measure ? Help needed turning from your back to your side while in a flat bed without using bedrails?: A Lot ?Help needed moving from lying on your back to sitting on the side of a flat bed without using bedrails?: A Lot ?Help needed moving to and from a bed to a chair (including a wheelchair)?: Total ?Help needed standing up from a chair using your arms (e.g., wheelchair or bedside chair)?: Total ?Help needed to walk in hospital room?: Total ?Help needed climbing 3-5 steps with a railing? : Total ?6 Click Score: 8 ? ?  ?End of Session   ?Activity Tolerance: Patient tolerated treatment well ?Patient left: in bed;with call bell/phone within reach;with bed alarm set;with nursing/sitter in room ?Nurse Communication: Mobility status ?PT Visit Diagnosis: Muscle weakness (generalized) (M62.81) ?  ? ? ?Time: 4680-3212 ?PT Time Calculation (min) (ACUTE ONLY): 29 min ? ?Charges:  $Therapeutic Exercise: 8-22 mins ?$Therapeutic Activity: 8-22 mins          ?          ? ?Zamiah Tollett B. Migdalia Dk PT, DPT ?Acute Rehabilitation Services ?Pager [phone] ?Office [phone] ? ? ? ?[place] ?03/15/2021, 1:37 PM ? ?

## 2021-03-15 NOTE — Progress Notes (Signed)
Nutrition Follow-up ? ?DOCUMENTATION CODES:  ? ?Non-severe (moderate) malnutrition in context of chronic illness ? ?INTERVENTION:  ? ?Resume tube feeds via post-pyloric Cortrak tube: ?- Start Jevity 1.5 @ 20 ml/hr and advance by 10 ml q 6 hours to goal rate of 55 ml/hr (1320 ml/day) ?- ProSource TF 45 ml BID ?- Free water flushes of 100 ml q 6 hours ? ?Tube feeding regimen at goal rate provides 2060 kcal, 106 grams of protein, and 1003 ml of H2O. ? ?Total free water with flushes: 1403 ml ? ?NUTRITION DIAGNOSIS:  ? ?Moderate Malnutrition related to chronic illness (COPD) as evidenced by moderate fat depletion, moderate muscle depletion. ? ?Ongoing, being addressed via TF ? ?GOAL:  ? ?Patient will meet greater than or equal to 90% of their needs ? ?Met via TF at goal rate ? ?MONITOR:  ? ?Diet advancement, Labs, Weight trends, TF tolerance, Skin, I & O's ? ?REASON FOR ASSESSMENT:  ? ?Ventilator ?  ? ?ASSESSMENT:  ? ?76 yo male admitted with SBO with incarcerated hernia. PMH includes dementia, bladder cancer, COPD, HTN, stroke. ? ?03/07 - s/p L inguinal hernia repair with mesh ?03/08 - extubated, Cortrak placed (tip gastric) ?03/14 - tube feeds discontinued due to concern for aspiration ?03/15 - Cortrak advanced (tip at LOT) ? ?Discussed pt with RN and during ICU rounds. Pt's Cortrak advanced post-pyloric today. Per CCM MD, okay to restart tube feeds at trickle rate and slowly advance to goal. RN aware of plan. ? ?Palliative Medicine Team involved in pt's care to help establish [place]. Per notes, pt's daughter seems to indicate pt would not want a PEG tube. Pt's daughter would prefer careful hand feeding attempts with known aspiration risk. Pt remains NPO at this time. ? ?Admit weight: 75 kg ?Current weight: 72.6 kg ? ?Pt with moderate pitting edema to BUE and non-pitting edema to BLE per nursing assessment. ? ?Medications reviewed and include: pepcid, SSI q 4 hours ? ?Labs reviewed: BUN 27, creatinine 1.26 ?CBG's: 91-151  x 24 hours ? ?UOP: 1875 ml x 24 hours ?I/O's: +3.3 L since admit ? ?Diet Order:   ?Diet Order   ? ?       ?  Diet NPO time specified  Diet effective now       ?  ? ?  ?  ? ?  ? ? ?EDUCATION NEEDS:  ? ?No education needs have been identified at this time ? ?Skin:  Skin Assessment: ?Skin Integrity Issues: ?Stage I: L heel ?Incisions: closed abdomen ?Other: MASD scrotum, skin tear scrotum ? ?Last BM:  03/14/21 smear type 7 ? ?Height:  ? ?Ht Readings from Last 1 Encounters:  ?03/02/21 $RemoveBe'5\' 8"'RbuHlgqap$  (1.727 m)  ? ? ?Weight:  ? ?Wt Readings from Last 1 Encounters:  ?03/14/21 72.6 kg  ? ? ?BMI:  Body mass index is 24.33 kg/m?. ? ?Estimated Nutritional Needs:  ? ?Kcal:  2000-2200 ? ?Protein:  100-120 gm ? ?Fluid:  >/= 2 L ? ? ? ?Gustavus Bryant, MS, RD, LDN ?Inpatient Clinical Dietitian ?Please see AMiON for contact information. ? ?

## 2021-03-15 NOTE — Procedures (Signed)
Cortrak ? ?Person Inserting Tube:  Maily Debarge, Creola Corn, RD ?Tube Type:  Cortrak - 43 inches ?Tube Size:  10 ?Tube Location:  Left nare ?Initial Placement:  Postpyloric ?Secured by: Dorann Lodge ?Technique Used to Measure Tube Placement:  Marking at nare/corner of mouth ?Cortrak Secured At:  100 cm ? ?Cortrak Tube Team Note: ? ?Consult received to place a Cortrak feeding tube.  ? ?X-ray is required, abdominal x-ray has been ordered by the Cortrak team. Please confirm tube placement before using the Cortrak tube.  ? ?If the tube becomes dislodged please keep the tube and contact the Cortrak team at [email or web address] (password [email or web address]) for replacement.  ?If after hours and replacement cannot be delayed, place a NG tube and confirm placement with an abdominal x-ray.  ? ? ? ?Theone Stanley., MS, RD, LDN (she/her/hers) ?RD pager number and weekend/on-call pager number located in [place]. ? ? ?

## 2021-03-15 NOTE — Consult Note (Signed)
?Consultation Note ?Date: 03/15/2021  ? ?Patient Name: Jason [name]  ?DOB: [birth year]  MRN: [id number]  Age / Sex: 76 y.o., male  ?PCP: Janifer Adie, MD ?Referring Physician: Collene Gobble, MD ? ?Reason for Consultation: Establishing goals of care ? ?HPI/Patient Profile: 76 y.o. male  with past medical history of dementia, bladder cancer, and COPD admitted on 03/02/2021 with shortness of breath and emesis.  Required immediate intubation in ED.  Found to have severe small bowel obstruction.  Also found to have UTI.  Underwent inguinal hernia repair March 7.  Was extubated March 8.  March 14 had recurrent aspiration and respiratory distress required transfer to ICU for closer monitoring.  PMT consulted for goals of care discussions. ? ?Of note, patient is with [facility] and goals of care conversations have been had between [facility] and daughter-daughter has requested full scope care. ? ?Clinical Assessment and Goals of Care: ?I have reviewed medical records including EPIC notes, labs and imaging, received report from RN, assessed the patient and then spoke with patient's daughter Jason [name] to discuss diagnosis prognosis, GOC, EOL wishes, disposition and options. ? ?I introduced Palliative Medicine as specialized medical care for people living with serious illness. It focuses on providing relief from the symptoms and stress of a serious illness. The goal is to improve quality of life for both the patient and the family. ? ?Daughter tells me patient has been living at [facility] for about 6 years.  She tells me she talked to him on the phone some.  She tells me he could walk around well, did not use a walker.  She tells me he had a great appetite.  She tells me that he was easily confused and minimally verbal, would often walk off during conversation. ? ? We discussed patient's current illness and what it means in the larger context of patient's on-going co-morbidities.  Natural  disease trajectory and expectations at EOL were discussed.  We discussed patient's recurrent aspiration pneumonia.  We discussed his advanced dementia and inability to manage his secretions.  Discussed his current dependence on artificial nutrition.  Discussed his ongoing risk of aspiration.  We discussed patient's frailty and malnutrition.  We discussed that many of his conditions are chronic and irreversible. ? ?I attempted to elicit values and goals of care important to the patient.  Daughter tells me patient wants to get better.  She tells me he wants to be back at [facility] with his friends. ? ?I shared with daughter my concern about his ability to improve from current situation.  She expresses understanding.  We discussed this long hospitalization is difficult on his body and he is continuing to get weaker. ? ?Advance directives, concepts specific to code status, artificial feeding and hydration, and rehospitalization were considered and discussed. ? ?We discussed that evidence has shown that PEG tubes in patients with advanced dementia do not increase survival, prevent aspiration, or improve wound healing.  They can promote isolation and use of restraints leading to increased potential for pressure ulcers. Use of PEG tubes is not medically recommended in this population; rather, careful hand feeding with aspiration precautions has been shown to provide the best quality of life.  Daughter seems to accept this information and seems to understand that a PEG tube is not recommended.  She states that she does not think her dad would want a PEG tube as she thinks he would want to be able to eat by mouth as much as he is able.  We discussed he would still be at risk for aspiration but he would have this risk with PEG tube as well. ? ?Encouraged daughter to consider DNR/DNI status understanding evidenced based poor outcomes in similar hospitalized patients, as the cause of the arrest is likely associated with  chronic/terminal disease rather than a reversible acute cardio-pulmonary event.  Daughter requests that patient remain full code telling me she would want CPR attempted. ? ?We briefly discussed potential need for a trach however daughter seemed overwhelmed by previously discussed information and so we did not discuss it in depth but she does tell me she understands this is a possibility. ? ?Discussed with daughter the importance of continued conversation with family and the medical providers regarding overall plan of care and treatment options, ensuring decisions are within the context of the patient?s values and GOCs.   ? ?Questions and concerns were addressed. The family was encouraged to call with questions or concerns. ? ?Primary Decision Maker ?NEXT OF KIN ?  ? ?SUMMARY OF RECOMMENDATIONS   ?-Ongoing conversations are needed, initial conversation and education complete ?-Discussed PEG tube at length and how it does not eliminate risk of aspiration-daughter seems to indicate patient would not want a PEG tube and she would prefer careful hand feeding attempts and I did explain that he would be at risk for aspiration with this as well ?-Extensive CODE STATUS discussion as well, education provided on recommendation of DNR, daughter continues to request full code ?-We will plan to follow-up tomorrow March 16 ? ?Code Status/Advance Care Planning: ?Full code ? ?  ? ?Primary Diagnoses: ?Present on Admission: ? Small bowel obstruction ([place]) ? ? ?I have reviewed the medical record, interviewed the patient and family, and examined the patient. The following aspects are pertinent. ? ?Past Medical History:  ?Diagnosis Date  ? Bladder mass 02-10-13  ? surgery planned for this  ? COPD (chronic obstructive pulmonary disease) ([place])   ? previous history and current smoking  ? Dementia ([place]) 02/18/2013  ? 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 history of-no problems  ? Hypertension   ? States" never any meds for this"  ? Right  bundle branch block   ? history of this  ? Stroke [facility])   ? Dementia, otherwise, no residual. 04-01-14 all resolved   ? 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 ?Social History  ? ?Socioeconomic History  ? Marital status: Divorced  ?  Spouse name: Not on file  ? Number of children: Not on file  ? Years of education: Not on file  ? Highest education level: Not on file  ?Occupational History  ? Not on file  ?Tobacco Use  ? Smoking status: Every Day  ?  Packs/day: 1.00  ?  Types: Cigarettes  ? Smokeless tobacco: Never  ?Substance and Sexual Activity  ? Alcohol use: Yes  ?  Comment: 2- 4 ounces  ? Drug use: No  ? Sexual activity: Not Currently  ?Other Topics Concern  ? Not on file  ?Social History Narrative  ? Not on file  ? ?Social Determinants of Health  ? ?Financial Resource Strain: Not on file  ?Food Insecurity: Not on file  ?Transportation Needs: Not on file  ?Physical Activity: Not on file  ?Stress: Not on file  ?Social Connections: Not on file  ? ?History reviewed. No pertinent family history. ?Scheduled Meds: ? chlorhexidine gluconate (MEDLINE KIT)  15 mL Mouth Rinse BID  ? Chlorhexidine Gluconate Cloth  6 each Topical Daily  ? enoxaparin (LOVENOX)  injection  40 mg Subcutaneous Q24H  ? famotidine  20 mg Per Tube Daily  ? free water  100 mL Per Tube Q6H  ? Gerhardt's butt cream   Topical BID  ? guaiFENesin  10 mL Per Tube Q6H  ? insulin aspart  0-15 Units Subcutaneous Q4H  ? mouth rinse  15 mL Mouth Rinse BID  ? sertraline  25 mg Per Tube Daily  ? ?Continuous Infusions: ? sodium chloride Stopped (03/13/21 1534)  ? ?PRN Meds:.acetaminophen, hydrALAZINE, ipratropium-albuterol ?No Known Allergies ?Review of Systems  ?Unable to perform ROS: Dementia  ? ?Physical Exam ?Constitutional:   ?   General: He is not in acute distress. ?   Appearance: He is ill-appearing.  ?   Comments: Briefly wakes to voice  ?Pulmonary:  ?   Effort: Pulmonary effort is normal.  ?Skin: ?   General: Skin is warm and dry.  ?Neurological:  ?    Mental Status: He is disoriented.  ? ? ?Vital Signs: BP (!) 149/89   Pulse 90   Temp 98.5 ?F (36.9 ?C) (Axillary)   Resp (!) 32   Ht _0  (1.727 m)   Wt 72.6 kg   SpO2 91%   BMI 24.33 kg/m?  ?Pain Scale: 0-10 ?

## 2021-03-16 ENCOUNTER — Inpatient Hospital Stay (HOSPITAL_COMMUNITY): Payer: Medicare (Managed Care)

## 2021-03-16 LAB — CBC
HCT: 33.7 % — ABNORMAL LOW (ref 39.0–52.0)
Hemoglobin: 10.4 g/dL — ABNORMAL LOW (ref 13.0–17.0)
MCH: 21.6 pg — ABNORMAL LOW (ref 26.0–34.0)
MCHC: 30.9 g/dL (ref 30.0–36.0)
MCV: 69.9 fL — ABNORMAL LOW (ref 80.0–100.0)
Platelets: 382 10*3/uL (ref 150–400)
RBC: 4.82 MIL/uL (ref 4.22–5.81)
RDW: 14.9 % (ref 11.5–15.5)
WBC: 8.5 10*3/uL (ref 4.0–10.5)
nRBC: 0 % (ref 0.0–0.2)

## 2021-03-16 LAB — BASIC METABOLIC PANEL
Anion gap: 12 (ref 5–15)
BUN: 32 mg/dL — ABNORMAL HIGH (ref 8–23)
CO2: 19 mmol/L — ABNORMAL LOW (ref 22–32)
Calcium: 8.2 mg/dL — ABNORMAL LOW (ref 8.9–10.3)
Chloride: 106 mmol/L (ref 98–111)
Creatinine, Ser: 1.18 mg/dL (ref 0.61–1.24)
GFR, Estimated: >60 mL/min (ref >60)
Glucose, Bld: 135 mg/dL — ABNORMAL HIGH (ref 70–99)
Potassium: 3.8 mmol/L (ref 3.5–5.1)
Sodium: 137 mmol/L (ref 135–145)

## 2021-03-16 LAB — GLUCOSE, CAPILLARY
Glucose-Capillary: 121 mg/dL — ABNORMAL HIGH (ref 70–99)
Glucose-Capillary: 126 mg/dL — ABNORMAL HIGH (ref 70–99)
Glucose-Capillary: 138 mg/dL — ABNORMAL HIGH (ref 70–99)
Glucose-Capillary: 146 mg/dL — ABNORMAL HIGH (ref 70–99)
Glucose-Capillary: 146 mg/dL — ABNORMAL HIGH (ref 70–99)
Glucose-Capillary: 153 mg/dL — ABNORMAL HIGH (ref 70–99)
Glucose-Capillary: 167 mg/dL — ABNORMAL HIGH (ref 70–99)

## 2021-03-16 LAB — MAGNESIUM: Magnesium: 1.8 mg/dL (ref 1.7–2.4)

## 2021-03-16 LAB — PHOSPHORUS: Phosphorus: 4.2 mg/dL (ref 2.5–4.6)

## 2021-03-16 MED ORDER — MAGNESIUM SULFATE 2 GM/50ML IV SOLN
2.0000 g | Freq: Once | INTRAVENOUS | Status: AC
  Administered 2021-03-16: 2 g via INTRAVENOUS
  Filled 2021-03-16: qty 50

## 2021-03-16 NOTE — Progress Notes (Signed)
I called and spoke to patient's daughter, Jason [name] this morning.  I updated her his clinical course.  We talked about his inability to control secretions secondary to his advanced dementia. Voncella understands that his dementia will progressively worsens and his aspiration will not likely to be resolved. ? ?I explained that hand suctioning is not a long-term plan. The team was discussing trach and PEG tube placement but I voiced my concern that these aggressive intervention will not necessarily improve his quality of life.  These may cause more harm than good.  I introduced the idea of palliative and comfort care, where we focus more on symptomatic treatment and ensure good quality of life. ? ?Voncella voices understanding and decided to lean towards comfort care.  She would like some time to think and will visit the patient in the afternoon.  I updated the palliative team and we will meet with her this afternoon to confirm DNR and comfort care status. ?

## 2021-03-16 NOTE — Progress Notes (Signed)
Modified Barium Swallow Progress Note ? ?Patient Details  ?Name: KARMAN VENEY ?MRN: [id number] ?Date of Birth: 03/30/1945 ? ?Today's Date: 03/16/2021 ? ?Modified Barium Swallow completed.  Full report located under Chart Review in the Imaging Section. ? ?Brief recommendations include the following: ? ?Clinical Impression ? Pt was seen for MBS. MBS revealed pt demonstrates a primarily mild oral dysphagia. During study, pt safely tolerated all consistencies and textures, however d/t cognitive-linguistic deficits there is always risk for potential aspiration events in the future. Oral phase of swallowing impaired with puree textures as evidenced by oral holding, piecemeal swallowing, and delayed oral transit/propulsion. Pt required supplemental sips of thin liquids via straw cup in order to resolve oral holding of puree texture. Oral holding behaviors may have been a result of increased distractions in radiology suite. Of note, pt did not have oral holding behaviors with regular textures or thin liquids. Initiation of pharyngegal swallow sequence occured at ramus of mandible, which was WNL. Epiglottic deflection complete with adequate closure of laryngeal vestibule. Across thin liquid trials, pt had congested wet cough, but pharyngeal swallow WNL with no occurrences of aspiration observed. SLP recommends initiating Dys 3 diet at this time with thin liquids. SLP to continue to follow for diet toleration. ?  ?Swallow Evaluation Recommendations ? ?   ? ? SLP Diet Recommendations: Dysphagia 3 (Mech soft) solids;Thin liquid ? ? Liquid Administration via: Straw ? ? Medication Administration: Whole meds with liquid ? ? Supervision: Staff to assist with self feeding ? ? Compensations: Slow rate;Small sips/bites;Follow solids with liquid ? ? Postural Changes: Seated upright at 90 degrees ? ? Oral Care Recommendations: Oral care QID ? ?   ? ? ? ?[name], Student-SLP ? ?03/16/2021,2:21 PM ?

## 2021-03-16 NOTE — Progress Notes (Signed)
Palliative: ? ?Chart check today.  Received update from nurse.  Assessed patient as well-he remains with significant cognitive impairment and unable to discuss goals of care.  Critical care physician had conversation with daughter this morning and we had planned for further goals of care discussions at 3 PM this afternoon.  I was available for meeting however daughter did not show and did not respond to phone call.  We will follow-up again tomorrow. ? ?Juel Burrow, DNP, AGNP-C ?Palliative Medicine Team ?Team Phone # [phone]  ?Pager # [phone] ? ?NO CHARGE ?

## 2021-03-16 NOTE — Progress Notes (Signed)
? ?Jason [name], MRN:  [id number], DOB:  03/28/1945, LOS: 40 ?ADMISSION DATE:  03/02/2021, CONSULTATION DATE:  03/16/2021 ?REFERRING MD:  Collene Gobble, MD CHIEF COMPLAINT:  SOB  ? ?History of Present Illness:  ?Pt is a 76 yo m w/ PMhx of dementia, bladder cancer presenting from SNF for SOB and emesis since early Tuesday AM. On arrival he was found to be somnolent w/ rhonchus breathing, sats in low 70s. Immediately intubated by ED MD and found feculent emesis throughout oropharynx and airway. Upon OGT placement had ~ 1L of immediate output. Per reports pt had associated tachypnea, fever and emesis since early Tuesday AM as well. No family present.  ? ?CT ab/pelvis on my review concerning for pneumatosis & SBO involving left inguinal & scrotum. Final read pending. ED MD was able to reduce palpable anterior abdominal wall portion however scrotum remains full. ED nursing unable to pass foley despite multiple attempts.  ? ?On 3/11 patient was transferred to IMTS for continued care.  Over the next few days patient has been having trouble spitting his secretions, he is at high risk of aspiration and apparently he is slowly aspirating. ?On 3/14 rapid response was called, patient was noted to be drooling and his own secretions, fighting and swinging at the staff to avoid suctioning, he remained confused and encephalopathic, PCCM was consulted for help evaluation and management ? ?Patient's family was contacted about goals of care discussion, they would want to continue full scope of care including reintubation and proceeding to tracheostomy ? ?Pertinent  Medical History  ?Dementia  ?? Hx of Bladder cancer  ? ?Significant Hospital Events: ?Including procedures, antibiotic start and stop dates in addition to other pertinent events   ?3/2: admission for septic shock; central line and a-line placed ?3/3: decreased pressor and vent requirement. Echo with normal EF; G2DD. Moderate RV enlargement; P/F 153 ?3/4: off levo; on  vaso alone. P/F 186 ?3/5: off pressors ?3/6: ESBL e.coli on UC. Transitioned to [facility].  ?3/7: inguinal hernia repair, no immediate complications. LUE duplex negative for DVT. CXR>improving infiltrates ?3/8: extubated; feeds started, a-line removed ?3/9: Surgery signed off ?3/11: on room air ?3/14 had recurrent aspiration and respirator distress, was fighting at suction attempts, transferred to ICU for closer monitoring and concern would require intubation ? ?Interim History / Subjective:  ?Patient was pleasant this morning.  He denies any issue or complaint. ? ?Objective   ?Blood pressure (!) 118/57, pulse 79, temperature 99.1 ?F (37.3 ?C), temperature source Axillary, resp. rate (!) 26, height '5\' 8"'$  (1.727 m), weight 76.2 kg, SpO2 (!) 89 %. ?   ?   ? ?Intake/Output Summary (Last 24 hours) at 03/16/2021 6073 ?Last data filed at 03/16/2021 0700 ?Gross per 24 hour  ?Intake 930 ml  ?Output 1150 ml  ?Net -220 ml  ? ? ?Filed Weights  ? 03/13/21 0500 03/14/21 1600 03/16/21 0447  ?Weight: 74.6 kg 72.6 kg 76.2 kg  ? ? ?Examination: ?General: Adult male, chronically ill, resting in bed, in NAD. ?Neuro: Awake, tracks, answers basic questions ?HEENT: [place]/AT. Sclerae anicteric.  ?Cardiovascular: RRR, no M/R/G. ?Lungs: In no respiratory distress.  I did hear gurgling noise when he tried to speak.  Harsh breath sounds bilaterally ?Abdomen: BS x 4, soft, NT/ND. ?Musculoskeletal: No gross deformities, no edema. ?Skin: Intact, warm, no rashes. ? ?Resolved Hospital Problem list   ?Septic shock secondary to aspiration pna vs e.coli UTI ?ARDS ?Hypernatremia ?SBO--started passing stool 3/9 ?Inguinal hernia--s/p repair 3/7 ?AKI--resolved 3/11 ?ESBL  E.coli UTI ?Transient atrial fibrillation ? ?Assessment & Plan:  ? ?Acute hypoxic/hypercapnic respiratory failure ?Recurrent aspiration pneumonia - s/p course Meropenem completed 3/13.  ?Inability to control secretions secondary to advanced dementia.  Palliative team has talked to daughter  yesterday but patient remains full code.  It seems like daughter still want to proceed with trach and PEG. ?I do not believe placing a trach and a PEG will improve his quality of life.  The palliative team and I will talk to her again today.  ?- PMT assistance greatly appreciated. ?- Continue Guaifenesin. ?- Intermittent suction as needed ? ?Moderate Malnutrition  ?Starting tube feed per core track ?-Do not believe PEG tube will improve his quality of life ? ?Non-anion gap metabolic acidosis ?No reported GI losses overnight.  Kidney function and electrolytes remain within normal limits.  He is not on diuretics. ?-Check BMP in a.m. ? 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 ?- Voiding trial as an outpatient per urology.  ? ?Advanced dementia  ?At baseline ambulates, AAOx1. Needs ADL assistance and minimally communicative. ?- Continue supportive care. ?- Avoid sedation. ?- Delirium precautions ? ?Microcytic anemia with low iron and TIBC. ?- Supplement once decision on PEG made. ? ?Best Practice (right click and "Reselect all SmartList Selections" daily)  ? ?Diet/type: NPO ?DVT prophylaxis: Subcu Lovenox ?GI prophylaxis: H2B ?Lines: N/A ?Foley:  Yes, and it is still needed per urology ?Code Status:  full code  ?We will update family ? ?Gaylan Gerold, DO ? ?

## 2021-03-16 NOTE — Progress Notes (Signed)
[facility] ADULT ICU REPLACEMENT PROTOCOL ? ? ?The patient does apply for the [facility] Adult ICU Electrolyte Replacment Protocol based on the criteria listed below:  ? ?1.Exclusion criteria: TCTS patients, ECMO patients, and Dialysis patients ?2. Is GFR >/= 30 ml/min? Yes.    ?Patient's GFR today is >60 ?3. Is SCr </= 2? Yes.   ?Patient's SCr is 1.18 mg/dL ?4. Did SCr increase >/= 0.5 in 24 hours? No. ?5.Pt's weight >40kg  Yes.   ?6. Abnormal electrolyte(s): mag 1.8  ?7. Electrolytes replaced per protocol ?8.  Call MD STAT for K+ </= 2.5, Phos </= 1, or Mag </= 1 ?Physician:  n/a ? ?Jason [name] 03/16/2021 4:11 AM ? ?

## 2021-03-16 NOTE — Progress Notes (Signed)
Speech Language Pathology Treatment: Dysphagia  ?Patient Details ?Name: Jason [name] ?MRN: [id number] ?DOB: [birth year] ?Today's Date: 03/16/2021 ?Time: 8882-8003 ?SLP Time Calculation (min) (ACUTE ONLY): 20 min ? ?Assessment / Plan / Recommendation ?Clinical Impression ? Pt demonstrating improved acceptance of PO trials since last targeted session (03/14/21). Upon entry into room, pt had congested breathing and weak volitional cough. Pt adequately accepted ice chips 5x via tsp with no overt s/s of aspiration. Pt followed command to sustain /ah/ phonation following PO ice chip trials, vocal quality observed as wet and congested. SLP branched up to teaspoon sips/tastes of thin liquid. Pt displayed delayed weak cough and continued wet vocal quality. Pt tolerated puree applesauce with no overt s/s of aspiration. Pt has hx of MBS in 2015 where silent aspiration with straw sips of thin liquids was observed. Due to history of silent aspiration and recent decline in functional status, SLP recommends continuing NPO diet with MBS scheduled for later this date. Alternative temporary means of nutrition remain appropriate at this time.  ?  ?HPI HPI: 76 yo m presenting from SNF 03/02/21 for SOB, fever and emesis since 03/07/21. On arrival he was found to be somnolent w/ rhonchus breathing, sats in low 70s. Immediately intubated by ED MD and found feculent emesis throughout oropharynx and airway. found to have incarcerated LIH with SBO and aspiration PNA s/p Left inguinal hernia repair with Ultrapro mesh Extubated 03/08/21 PMhx of dementia, bladder cancer ?  ?   ?SLP Plan ? MBS ? ?  ?  ?Recommendations for follow up therapy are one component of a multi-disciplinary discharge planning process, led by the attending physician.  Recommendations may be updated based on patient status, additional functional criteria and insurance authorization. ?  ? ?Recommendations  ?Diet recommendations: NPO ?Medication Administration: Via alternative  means ?Postural Changes and/or Swallow Maneuvers: Seated upright 90 degrees  ?   ?    ?   ? ? ? ? Oral Care Recommendations: Oral care QID ?Follow Up Recommendations: Skilled nursing-short term rehab (<3 hours/day) ?Assistance recommended at discharge: Frequent or constant Supervision/Assistance ?SLP Visit Diagnosis: Dysphagia, oropharyngeal phase (R13.12) ?Plan: MBS ? ? ? ? ?  ?  ? ? ?Jason [name] ? ?03/16/2021, 9:26 AM ?

## 2021-03-17 DIAGNOSIS — I4891 Unspecified atrial fibrillation: Secondary | ICD-10-CM | POA: Clinically undetermined

## 2021-03-17 DIAGNOSIS — Z66 Do not resuscitate: Secondary | ICD-10-CM

## 2021-03-17 DIAGNOSIS — Z515 Encounter for palliative care: Secondary | ICD-10-CM

## 2021-03-17 DIAGNOSIS — T17908S Unspecified foreign body in respiratory tract, part unspecified causing other injury, sequela: Secondary | ICD-10-CM

## 2021-03-17 DIAGNOSIS — F03C11 Unspecified dementia, severe, with agitation: Secondary | ICD-10-CM

## 2021-03-17 DIAGNOSIS — D649 Anemia, unspecified: Secondary | ICD-10-CM | POA: Diagnosis present

## 2021-03-17 LAB — GLUCOSE, CAPILLARY
Glucose-Capillary: 152 mg/dL — ABNORMAL HIGH (ref 70–99)
Glucose-Capillary: 151 mg/dL — ABNORMAL HIGH (ref 70–99)
Glucose-Capillary: 213 mg/dL — ABNORMAL HIGH (ref 70–99)

## 2021-03-17 LAB — BASIC METABOLIC PANEL
Anion gap: 11 (ref 5–15)
BUN: 34 mg/dL — ABNORMAL HIGH (ref 8–23)
CO2: 24 mmol/L (ref 22–32)
Calcium: 8.6 mg/dL — ABNORMAL LOW (ref 8.9–10.3)
Chloride: 105 mmol/L (ref 98–111)
Creatinine, Ser: 1.34 mg/dL — ABNORMAL HIGH (ref 0.61–1.24)
GFR, Estimated: 55 mL/min — ABNORMAL LOW (ref >60)
Glucose, Bld: 140 mg/dL — ABNORMAL HIGH (ref 70–99)
Potassium: 5 mmol/L (ref 3.5–5.1)
Sodium: 140 mmol/L (ref 135–145)

## 2021-03-17 MED ORDER — HALOPERIDOL LACTATE 2 MG/ML PO CONC
2.0000 mg | ORAL | Status: DC | PRN
  Filled 2021-03-17: qty 1

## 2021-03-17 MED ORDER — GLYCOPYRROLATE 0.2 MG/ML IJ SOLN
0.2000 mg | INTRAMUSCULAR | Status: DC | PRN
Start: 2021-03-17 — End: 2021-03-20

## 2021-03-17 MED ORDER — HALOPERIDOL 1 MG PO TABS
2.0000 mg | ORAL_TABLET | ORAL | Status: DC | PRN
  Filled 2021-03-17: qty 2

## 2021-03-17 MED ORDER — JEVITY 1.5 CAL/FIBER PO LIQD
1000.0000 mL | ORAL | Status: DC
  Filled 2021-03-17: qty 1000

## 2021-03-17 MED ORDER — BIOTENE DRY MOUTH MT LIQD
15.0000 mL | Freq: Three times a day (TID) | OROMUCOSAL | Status: DC
  Administered 2021-03-17 – 2021-03-20 (×5): 15 mL via TOPICAL

## 2021-03-17 MED ORDER — LORAZEPAM 1 MG PO TABS: 1.0000 mg | ORAL_TABLET | ORAL | Status: DC | PRN

## 2021-03-17 MED ORDER — LORAZEPAM 2 MG/ML PO CONC: 1.0000 mg | ORAL | Status: DC | PRN

## 2021-03-17 MED ORDER — GLYCOPYRROLATE 1 MG PO TABS
1.0000 mg | ORAL_TABLET | ORAL | Status: DC | PRN
  Filled 2021-03-17: qty 1

## 2021-03-17 MED ORDER — HALOPERIDOL LACTATE 5 MG/ML IJ SOLN: 2.0000 mg | INTRAMUSCULAR | Status: DC | PRN

## 2021-03-17 MED ORDER — LORAZEPAM 2 MG/ML IJ SOLN: 1.0000 mg | INTRAMUSCULAR | Status: DC | PRN

## 2021-03-17 MED ORDER — GLYCOPYRROLATE 0.2 MG/ML IJ SOLN: 0.2000 mg | INTRAMUSCULAR | Status: DC | PRN

## 2021-03-17 MED ORDER — POLYVINYL ALCOHOL 1.4 % OP SOLN: 1.0000 [drp] | Freq: Four times a day (QID) | OPHTHALMIC | Status: DC | PRN

## 2021-03-17 MED ORDER — MORPHINE SULFATE (PF) 2 MG/ML IV SOLN: 2.0000 mg | INTRAVENOUS | Status: DC | PRN

## 2021-03-17 NOTE — Progress Notes (Signed)
Physical Therapy Treatment ?Patient Details ?Name: Jason [name] ?MRN: [id number] ?DOB: 1945/04/28 ?Today's Date: 03/17/2021 ? ? ?History of Present Illness 76 yo m presenting from SNF 03/09/21 for SOB, fever and emesis since 03/07/21. On arrival he was found to be somnolent w/ rhonchus breathing, sats in low 70s. Immediately intubated by ED MD and found feculent emesis throughout oropharynx and airway. found to have incarcerated LIH with SBO and aspiration PNA s/p Left inguinal hernia repair with Ultrapro mesh Extubated 03/08/21 PMhx of dementia, bladder cancer ? ?  ?PT Comments  ? ? Pt is much more alert and participatory today during session. Pt continues to be limited in safe mobility by increased L UE weakness, decreased cognition and slowed processing. Pt is able to perform bed mobility with modAx2 and with increased time and cuing pt able to come to standing using back of recliner to pull up on. Pt able to stand ~min before fatigue and return to seated. D/c plan remains appropriate at this time. PT will continue to follow acutely  ?Recommendations for follow up therapy are one component of a multi-disciplinary discharge planning process, led by the attending physician.  Recommendations may be updated based on patient status, additional functional criteria and insurance authorization. ? ?Follow Up Recommendations ? Skilled nursing-short term rehab (<3 hours/day) ?  ?  ?Assistance Recommended at Discharge Frequent or constant Supervision/Assistance  ?Patient can return home with the following   ?  ?Equipment Recommendations ? None recommended by PT  ?  ?   ?Precautions / Restrictions Precautions ?Precautions: Fall ?Precaution Comments: Cortrak, L inguinal hernia incision glued shut, can be agressive/combative; L hemiparesis (UE>LE) ?Restrictions ?Weight Bearing Restrictions: No  ?  ? ?Mobility ? Bed Mobility ?Overal bed mobility: Needs Assistance ?Bed Mobility: Sit to Supine, Sidelying to Sit ?  ?Sidelying to sit: Mod  assist, +2 for physical assistance ?  ?Sit to supine: Mod assist, HOB elevated ?  ?General bed mobility comments: slow motor planning, able to bring LEs off with moderate assist,heavier assist with lifting trunk but able to hold to bedrail with R UE once placed on bedrail. able to lean with control to L side to get back in bed with +1 assist to manage legs ?  ? ?Transfers ?Overall transfer level: Needs assistance ?  ?Transfers: Sit to/from Stand ?Sit to Stand: Mod assist, +2 physical assistance, +2 safety/equipment ?  ?  ?  ?  ?  ?General transfer comment: Guided pt in sit to stand with cues to pull on back of locked recliner. Overall Mod A x 2 to stand with increased time to initiate movement. able to stand > 15 seconds though L lateral lean progressing with fatigue ?  ? ? ?  ? ? ?  ?Balance Overall balance assessment: Needs assistance ?Sitting-balance support: Feet supported, Single extremity supported ?Sitting balance-Leahy Scale: Poor ?Sitting balance - Comments: continues to have increased L lateral lean able to compensate with initially sitting up but quickly fatigues and requires leaning on L elbow or staff support on L side ?Postural control: Left lateral lean ?Standing balance support: Single extremity supported, Bilateral upper extremity supported, During functional activity ?Standing balance-Leahy Scale: Poor ?Standing balance comment: external assist + pt holding to back of locked recliner ?  ?  ?  ?  ?  ?  ?  ?  ?  ?  ?  ?  ? ?  ?Cognition Arousal/Alertness: Awake/alert ?Behavior During Therapy: Flat affect ?Overall Cognitive Status: History of cognitive impairments - at baseline ?  ?  ?  ?  ?  ?  ?  ?  ?  ?  ?  ?  ?  ?  ?  ?  ?  General Comments: oriented to self, followed >75% of single step commands (though decreased motor planning for L UE movement commands- often follows commands for R UE instead) with extremely increased time ?  ?  ? ?  ?Exercises Other Exercises ?Other Exercises: PROM of L UE  (specifically hand/wrist) ?Other Exercises: kicking legs out EOB ? ?  ?General Comments General comments (skin integrity, edema, etc.): VSS on RA ?  ?  ? ?Pertinent Vitals/Pain Pain Assessment ?Pain Assessment: Faces ?Faces Pain Scale: No hurt  ? ? ? ?PT Goals (current goals can now be found in the care plan section) Acute Rehab PT Goals ?PT Goal Formulation: Patient unable to participate in goal setting ?Time For Goal Achievement: 03/23/21 ?Potential to Achieve Goals: Fair ?Progress towards PT goals: Progressing toward goals ? ?  ?Frequency ? ? ? Min 2X/week ? ? ? ?  ?PT Plan Current plan remains appropriate  ? ? ?Co-evaluation PT/OT/SLP Co-Evaluation/Treatment: Yes ?Reason for Co-Treatment: Complexity of the patient's impairments (multi-system involvement);Necessary to address cognition/behavior during functional activity;For patient/therapist safety;To address functional/ADL transfers (to attempt to stand) ?PT goals addressed during session: Mobility/safety with mobility ?  ?  ? ?  ?AM-PAC PT "6 Clicks" Mobility   ?Outcome Measure ? Help needed turning from your back to your side while in a flat bed without using bedrails?: A Lot ?Help needed moving from lying on your back to sitting on the side of a flat bed without using bedrails?: A Lot ?Help needed moving to and from a bed to a chair (including a wheelchair)?: Total ?Help needed standing up from a chair using your arms (e.g., wheelchair or bedside chair)?: Total ?Help needed to walk in hospital room?: Total ?Help needed climbing 3-5 steps with a railing? : Total ?6 Click Score: 8 ? ?  ?End of Session Equipment Utilized During Treatment: Gait belt ?Activity Tolerance: Patient tolerated treatment well ?Patient left: in bed;with call bell/phone within reach;with bed alarm set;with nursing/sitter in room ?Nurse Communication: Mobility status ?PT Visit Diagnosis: Muscle weakness (generalized) (M62.81) ?  ? ? ?Time: 9528-4132 ?PT Time Calculation (min) (ACUTE  ONLY): 23 min ? ?Charges:  $Therapeutic Activity: 8-22 mins          ?          ? ?Jason [name] B. Migdalia Dk PT, DPT ?Acute Rehabilitation Services ?Pager [phone] ?Office [phone] ? ? ? ?[place] ?03/17/2021, 1:31 PM ? ?

## 2021-03-17 NOTE — Progress Notes (Signed)
?                                                   ?Daily Progress Note  ? ?Patient Name: Jason [name]       Date: 03/17/2021 ?DOB: [birth year]  Age: 76 y.o. MRN#: [id number] ?Attending Physician: Marshell Garfinkel, MD ?Primary Care Physician: Janifer Adie, MD ?Admit Date: 03/02/2021 ? ?Reason for Consultation/Follow-up: Establishing goals of care ? ?Subjective: ?AMS continues, appears restless ? ?Length of Stay: 15 ? ?Current Medications: ?Scheduled Meds:  ? antiseptic oral rinse  15 mL Topical TID  ? chlorhexidine gluconate (MEDLINE KIT)  15 mL Mouth Rinse BID  ? Chlorhexidine Gluconate Cloth  6 each Topical Daily  ? enoxaparin (LOVENOX) injection  40 mg Subcutaneous Q24H  ? famotidine  20 mg Per Tube Daily  ? Gerhardt's butt cream   Topical BID  ? guaiFENesin  10 mL Per Tube Q6H  ? mouth rinse  15 mL Mouth Rinse BID  ? sertraline  25 mg Per Tube Daily  ? ? ?Continuous Infusions: ? sodium chloride Stopped (03/13/21 1534)  ? ? ?PRN Meds: ?acetaminophen, glycopyrrolate **OR** glycopyrrolate **OR** glycopyrrolate, haloperidol **OR** haloperidol **OR** haloperidol lactate, hydrALAZINE, ipratropium-albuterol, LORazepam **OR** LORazepam **OR** LORazepam, morphine injection, polyvinyl alcohol ? ?Physical Exam ?Constitutional:   ?   General: He is not in acute distress. ?   Appearance: He is ill-appearing.  ?   Comments: Appears agitated ?  ?Pulmonary:  ?   Effort: Pulmonary effort is normal.  ?Skin: ?   General: Skin is warm and dry.  ?Neurological:  ?   Mental Status: He is alert.  ?         ? ?Vital Signs: BP 123/61   Pulse 82   Temp 98.3 ?F (36.8 ?C) (Axillary)   Resp (!) 24   Ht $R'5\' 8"'aR$  (1.727 m)   Wt 74.8 kg   SpO2 94%   BMI 25.09 kg/m?  ?SpO2: SpO2: 94 % ?O2 Device: O2 Device: Room Air ?O2 Flow Rate: O2 Flow Rate (L/min): 6 L/min ? ?Intake/output summary:  ?Intake/Output  Summary (Last 24 hours) at 03/17/2021 1636 ?Last data filed at 03/17/2021 1200 ?Gross per 24 hour  ?Intake 1600 ml  ?Output 1025 ml  ?Net 575 ml  ? ?LBM: Last BM Date : 03/14/21 ?Baseline Weight: Weight: 75 kg ?Most recent weight: Weight: 74.8 kg ? ?     ?Palliative Assessment/Data: PPS 40% ? ? ? ?Flowsheet Rows   ? ?Flowsheet Row Most Recent Value  ?Intake Tab   ?Referral Department Critical care  ?Unit at Time of Referral ICU  ?Palliative Care Primary Diagnosis Sepsis/Infectious Disease  ?Date Notified 03/14/21  ?Palliative Care Type New Palliative care  ?Reason for referral Clarify Goals of Care  ?Date of Admission 03/02/21  ?Date first seen by Palliative Care 03/15/21  ?# of days Palliative referral response time 1 Day(s)  ?# of days IP prior to Palliative referral 12  ?Clinical Assessment   ?Psychosocial & Spiritual Assessment   ?Palliative Care Outcomes   ? ?  ? ? ?Patient Active Problem List  ? Diagnosis Date Noted  ? Palliative care status 03/17/2021  ? Comfort measures only status 03/17/2021  ? Anemia 03/17/2021  ? Atrial fibrillation, transient ([place]) 03/17/2021  ? Aspiration into respiratory tract   ? Malnutrition of moderate degree 03/03/2021  ?  Incarcerated hernia   ? Pressure injury of skin 01/30/2018  ? Small bowel obstruction ([place]) 01/27/2018  ? Inguinal hernia 01/27/2018  ? Acute esophagitis 01/27/2018  ? Hematemesis 01/27/2018  ? Pneumonia 12/15/2015  ? Acute encephalopathy 03/07/2013  ? Postoperative intra-abdominal abscess 03/07/2013  ? Hydronephrosis, bilateral 03/07/2013  ? Pressure ulcer, stage PNP(005.11) 03/07/2013  ? UTI (lower urinary tract infection) 02/18/2013  ? Septic shock ([place]) 02/18/2013  ? Aspiration pneumonia ([place]) 02/18/2013  ? Acute hypoxemic respiratory failure ([place]) 02/18/2013  ? Acute renal failure ([place]) 02/18/2013  ? Dementia ([place]) 02/18/2013  ? Hematuria 02/18/2013  ? 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 Memory loss 03/27/2009  ? CELLULITIS, HAND, LEFT 03/23/2009  ?  FATIGUE 03/15/2009  ? ALCOHOL ABUSE 01/30/2008  ? GOITER 10/25/2006  ? 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 HYPERTENSION 10/25/2006  ? COLONIC POLYPS, HX OF 10/25/2006  ? BENIGN PROSTATIC HYPERTROPHY, HX OF 10/25/2006  ? ? ?Palliative Care Assessment & Plan  ? ?HPI: ?76 y.o. male  with past medical history of dementia, bladder cancer, and COPD admitted on 03/02/2021 with shortness of breath and emesis.  Required immediate intubation in ED.  Found to have severe small bowel obstruction.  Also found to have UTI.  Underwent inguinal hernia repair March 7.  Was extubated March 8.  March 14 had recurrent aspiration and respiratory distress required transfer to ICU for closer monitoring.  PMT consulted for goals of care discussions. ? ?Of note, patient is with [facility] and goals of care conversations have been had between [facility] and daughter-daughter has requested full scope care. ? ?Assessment: ?Discussion between myself, Dr. Alfonse Spruce, and patient's bedside nurse [place] with patient's daughter [name]. Patient's hospital course was reviewed. We discussed that despite aggressive medical care patient is not improving. We discussed the chronic, progressive nature of his advanced dementia and resulting dysphagia and aspiration risk. We discussed high risk for complications r/t aspiration. We discuss that trach/peg would not improve patient's quality of life. Discuss transition to focus on comfort. Voncella agrees that patient would not want trach/peg and would like to transition to focused comfort care. We discussed moving back to his LTC facility where he is familiar with the support of hospice care - Voncella is aware of hospice and type of care provided. We also discuss code status. Encouraged Voncella to consider DNR/DNI status understanding evidenced based poor outcomes in similar hospitalized patients, as the cause of the arrest is likely associated with chronic/terminal disease rather than a reversible acute cardio-pulmonary  event. Voncella agrees to DNR.  ? ?Recommendations/Plan: ?Code status change to DNR ?Focus of comfort - free patient from feeding tube, do not pursue trach/peg ?Move to [facility] with hospice support once arrangements are made3 ?Will follow ? ?Code Status: ?DNR ? ?Discharge Planning: ?[place] with Hospice ? ?Care plan was discussed with Dr. Alfonse Spruce, RN, patient's daughter ? ?Thank you for allowing the Palliative Medicine Team to assist in the care of this patient. ? ? ?*Please note that this is a verbal dictation therefore any spelling or grammatical errors are due to the "[place] [employer]" system interpretation. ? ?Juel Burrow, DNP, AGNP-C ?Palliative Medicine Team ?Team Phone # [phone]  ?Pager [phone] ? ?

## 2021-03-17 NOTE — TOC Progression Note (Addendum)
Transition of Care (TOC) - Initial/Assessment Note  ? ? ?Patient Details  ?Name: Jason [name] ?MRN: [id number] ?Date of Birth: 05-Nov-1945 ? ?Transition of Care (TOC) CM/SW Contact:    ?Paulene Floor Makenzy Krist, LCSWA ?Phone Number: ?03/17/2021, 3:15 PM ? ?Clinical Narrative:                 ?CSW received notification from the palliative team that the plan is for the patient to return to [facility] with Hospice.  CSW contacted the patient's [facility] social worker Buddy Duty [phone]) and informed of plan.  Ms. Grandville Silos will call CSW back with next steps.   ? ?15:29-  CSW notified by [facility] that the patient could return to [facility] on Monday and [facility] would cover Hospice care.   ? ?Expected Discharge Plan: [place] ?Barriers to Discharge: Continued Medical Work up ? ? ?Patient Goals and CMS Choice ?  ?  ?Choice offered to / list presented to :  (NA-Pt is in long term care at [facility] and daughter would like for pt to return.) ? ?Expected Discharge Plan and Services ?Expected Discharge Plan: [place] ?In-house Referral: Clinical Social Work ?  ?Post Acute Care Choice: [place] ?Living arrangements for the past 2 months: [place] ?                ?  ?  ?  ?  ?  ?  ?  ?  ?  ?  ? ?Prior Living Arrangements/Services ?Living arrangements for the past 2 months: [place] ?Lives with:: Facility Resident ?Patient language and need for interpreter reviewed:: No ?       ?Need for Family Participation in Patient Care: Yes (Comment) ?Care giver support system in place?: Yes (comment) ?Current home services: Other (comment) (na) ?Criminal Activity/Legal Involvement Pertinent to Current Situation/Hospitalization: No - Comment as needed ? ?Activities of Daily Living ?  ?ADL Screening (condition at time of admission) ?Is the patient deaf or have difficulty hearing?: No ?Does the patient have difficulty seeing, even when wearing glasses/contacts?:  No ?Does the patient have difficulty concentrating, remembering, or making decisions?: Yes ?Does the patient have difficulty dressing or bathing?: No ?Does the patient have difficulty walking or climbing stairs?: No ? ?Permission Sought/Granted ?  ?  ?   ?   ?   ?   ? ?Emotional Assessment ?  ?  ?  ?Orientation: : Oriented to Self ?Alcohol / Substance Use: Not Applicable ?Psych Involvement: No (comment) ? ?Admission diagnosis:  Dehydration [E86.0] ?Small bowel obstruction ([place]) [K56.609] ?SBO (small bowel obstruction) ([place]) [K56.609] ?Hyperglycemia [R73.9] ?AKI (acute kidney injury) ([place]) [N17.9] ?Incarcerated hernia [K46.0] ?Aspiration pneumonia due to gastric secretions, unspecified laterality, unspecified part of lung ([place]) [J69.0] ?Sepsis, due to unspecified organism, unspecified whether acute organ dysfunction present ([place]) [A41.9] ?Vomiting, unspecified vomiting type, unspecified whether nausea present [R11.10] ?Patient Active Problem List  ? Diagnosis Date Noted  ? Aspiration into respiratory tract   ? Malnutrition of moderate degree 03/03/2021  ? Incarcerated hernia   ? Pressure injury of skin 01/30/2018  ? Small bowel obstruction ([place]) 01/27/2018  ? Inguinal hernia 01/27/2018  ? Acute esophagitis 01/27/2018  ? Hematemesis 01/27/2018  ? Pneumonia 12/15/2015  ? Acute encephalopathy 03/07/2013  ? Postoperative intra-abdominal abscess 03/07/2013  ? Hydronephrosis, bilateral 03/07/2013  ? Pressure ulcer, stage ZWC(585.27) 03/07/2013  ? UTI (lower urinary tract infection) 02/18/2013  ? Septic shock ([place]) 02/18/2013  ?  Aspiration pneumonia ([place]) 02/18/2013  ? Acute hypoxemic respiratory failure ([place]) 02/18/2013  ? Acute renal failure ([place]) 02/18/2013  ? Dementia ([place]) 02/18/2013  ? Hematuria 02/18/2013  ? 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 Memory loss 03/27/2009  ? CELLULITIS, HAND, LEFT 03/23/2009  ? FATIGUE 03/15/2009  ? ALCOHOL ABUSE 01/30/2008  ? GOITER 10/25/2006  ? 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  HYPERTENSION 10/25/2006  ? COLONIC POLYPS, HX OF 10/25/2006  ? BENIGN PROSTATIC HYPERTROPHY, HX OF 10/25/2006  ? ?PCP:  Janifer Adie, MD ?Pharmacy:  No Pharmacies Listed ? ? ? ?Social Determinants of Health (SDOH) Interventions ?  ? ?Readmission Risk Interventions ?No flowsheet data found. ? ? ?

## 2021-03-17 NOTE — Progress Notes (Signed)
Pt's daughter [name], notified of pt's transfer to 6N ?

## 2021-03-17 NOTE — Progress Notes (Signed)
Occupational Therapy Treatment ?Patient Details ?Name: Jason [name] ?MRN: [id number] ?DOB: [birth year] ?Today's Date: 03/17/2021 ? ? ?History of present illness 76 yo m presenting from SNF 03/09/21 for SOB, fever and emesis since 03/07/21. On arrival he was found to be somnolent w/ rhonchus breathing, sats in low 70s. Immediately intubated by ED MD and found feculent emesis throughout oropharynx and airway. found to have incarcerated LIH with SBO and aspiration PNA s/p Left inguinal hernia repair with Ultrapro mesh Extubated 03/08/21 PMhx of dementia, bladder cancer ?  ?OT comments ? Pt progressing well towards OT goals today, able to follow one step commands >75% of the time (difficulty following directions for L UE) and benefits from slow pacing of sessions to prevent agitation. Pt continues to present with L lateral lean but able to tolerate standing attempts today with Mod A x 2 for short period of time. With increased time and cues, pt able to initiate to assist with LB dressing, bed mobility and posture corrections though continues to require extensive assist to complete these tasks. L digit stiffness and crepitation noted but able to easily stretch WFL - no resting hand splint currently needed due to PROM [facility] but will continue to monitor.  ? ?VSS on RA.  ? ?Recommendations for follow up therapy are one component of a multi-disciplinary discharge planning process, led by the attending physician.  Recommendations may be updated based on patient status, additional functional criteria and insurance authorization. ?   ?Follow Up Recommendations ? Skilled nursing-short term rehab (<3 hours/day)  ?  ?Assistance Recommended at Discharge Frequent or constant Supervision/Assistance  ?Patient can return home with the following ? Two people to help with walking and/or transfers;Two people to help with bathing/dressing/bathroom ?  ?Equipment Recommendations ? None recommended by OT  ?  ?Recommendations for Other Services   ? ?   ?Precautions / Restrictions Precautions ?Precautions: Fall ?Precaution Comments: [place], L inguinal hernia incision glued shut, can be agressive/combative; L hemiparesis (UE>LE) ?Restrictions ?Weight Bearing Restrictions: No  ? ? ?  ? ?Mobility Bed Mobility ?Overal bed mobility: Needs Assistance ?Bed Mobility: Sit to Supine, Sidelying to Sit ?  ?Sidelying to sit: Mod assist, +2 for physical assistance ?  ?Sit to supine: Mod assist, HOB elevated ?  ?General bed mobility comments: slow motor planning, able to bring LEs off with moderate assist,heavier assist with lifting trunk but able to hold to bedrail with R UE once placed on bedrail. able to lean with control to L side to get back in bed with +1 assist to manage legs ?  ? ?Transfers ?Overall transfer level: Needs assistance ?  ?Transfers: Sit to/from Stand ?Sit to Stand: Mod assist, +2 physical assistance, +2 safety/equipment ?  ?  ?  ?  ?  ?General transfer comment: Guided pt in sit to stand with cues to pull on back of locked recliner. Overall Mod A x 2 to stand with increased time to initiate movement. able to stand > 15 seconds though L lateral lean progressing with fatigue ?  ?  ?Balance Overall balance assessment: Needs assistance ?Sitting-balance support: Feet supported, Single extremity supported ?Sitting balance-Leahy Scale: Poor ?Sitting balance - Comments: continues to have increased L lateral lean able to compensate with initially sitting up but quickly fatigues and requires leaning on L elbow or staff support on L side ?Postural control: Left lateral lean ?Standing balance support: Single extremity supported, Bilateral upper extremity supported, During functional activity ?Standing balance-Leahy Scale: Poor ?Standing balance comment: external assist + pt  holding to back of locked recliner ?  ?  ?  ?  ?  ?  ?  ?  ?  ?  ?  ?   ? ?ADL either performed or assessed with clinical judgement  ? ?ADL Overall ADL's : Needs assistance/impaired ?  ?  ?  ?  ?  ?   ?  ?  ?  ?  ?Lower Body Dressing: Maximal assistance;Bed level ?Lower Body Dressing Details (indicate cue type and reason): pt attempting to assist in donning R sock with long sitting in bed. intiiating to lean forward and attempt to pull over heel with cues though endorsed need for assist when asked ?  ?  ?  ?  ?  ?  ?  ?General ADL Comments: Focus on progression of standing attempts, command following and engaging in normal movement patterns ?  ? ?Extremity/Trunk Assessment Upper Extremity Assessment ?Upper Extremity Assessment: RUE deficits/detail;LUE deficits/detail ?RUE Deficits / Details: squeezed hand and would use in normal movement patterns. 4/5 strength ?LUE Deficits / Details: hx of hemiparesis, increased tone with digits resting in flexed position but easily stretched WFL; crepitation noted with digit ROM ?LUE Coordination: decreased fine motor;decreased gross motor ?  ?Lower Extremity Assessment ?Lower Extremity Assessment: Defer to PT evaluation ?  ?  ?  ? ?Vision   ?Vision Assessment?: No apparent visual deficits ?  ?Perception   ?  ?Praxis   ?  ? ?Cognition Arousal/Alertness: Awake/alert ?Behavior During Therapy: Flat affect ?Overall Cognitive Status: History of cognitive impairments - at baseline ?  ?  ?  ?  ?  ?  ?  ?  ?  ?  ?  ?  ?  ?  ?  ?  ?General Comments: oriented to self, followed >75% of single step commands (though decreased motor planning for L UE movement commands- often follows commands for R UE instead) with extremely increased time ?  ?  ?   ?Exercises Exercises: Other exercises ?Other Exercises ?Other Exercises: PROM of L UE (specifically hand/wrist) ?Other Exercises: kicking legs out EOB ? ?  ?Shoulder Instructions   ? ? ?  ?General Comments VSS on RA  ? ? ?Pertinent Vitals/ Pain       Pain Assessment ?Pain Assessment: Faces ?Faces Pain Scale: No hurt ?Pain Intervention(s): Monitored during session ? ?Home Living   ?  ?  ?  ?  ?  ?  ?  ?  ?  ?  ?  ?  ?  ?  ?  ?  ?  ?  ? ?  ?Prior  Functioning/Environment    ?  ?  ?  ?   ? ?Frequency ? Min 2X/week  ? ? ? ? ?  ?Progress Toward Goals ? ?OT Goals(current goals can now be found in the care plan section) ? Progress towards OT goals: Progressing toward goals ? ?Acute Rehab OT Goals ?OT Goal Formulation: Patient unable to participate in goal setting ?Time For Goal Achievement: 03/24/21 ?Potential to Achieve Goals: Fair ?ADL Goals ?Pt Will Perform Grooming: with max assist;sitting;bed level ?Pt/caregiver will Perform Home Exercise Program: Both right and left upper extremity;With minimal assist ?Additional ADL Goal #1: Pt will complete bed mobility at max A +1 level to facilite pericare.  ?Plan Discharge plan remains appropriate   ? ?Co-evaluation ? ? ? PT/OT/SLP Co-Evaluation/Treatment: Yes ?Reason for Co-Treatment: Complexity of the patient's impairments (multi-system involvement);Necessary to address cognition/behavior during functional activity;For patient/therapist safety;To address functional/ADL transfers;Other (comment) (to attempt  standing) ?  ?OT goals addressed during session: ADL's and self-care;Strengthening/ROM ?  ? ?  ?AM-PAC OT "6 Clicks" Daily Activity     ?Outcome Measure ? ? Help from another person eating meals?: Total ?Help from another person taking care of personal grooming?: Total ?Help from another person toileting, which includes using toliet, bedpan, or urinal?: Total ?Help from another person bathing (including washing, rinsing, drying)?: Total ?Help from another person to put on and taking off regular upper body clothing?: Total ?Help from another person to put on and taking off regular lower body clothing?: Total ?6 Click Score: 6 ? ?  ?End of Session Equipment Utilized During Treatment: Gait belt ? ?OT Visit Diagnosis: Other abnormalities of gait and mobility (R26.89);Unsteadiness on feet (R26.81);Muscle weakness (generalized) (M62.81);Other symptoms and signs involving cognitive function;Pain ?  ?Activity Tolerance No  increased pain;Patient tolerated treatment well ?  ?Patient Left in bed;with family/visitor present ?  ?Nurse Communication Mobility status ?  ? ?   ? ?Time: 1007-1219 ?OT Time Calculation (min): 34 min ?

## 2021-03-17 NOTE — Progress Notes (Signed)
? ?NAMEDEQUANTE TREMAINE, MRN:  [id number], DOB:  02-21-1945, LOS: 1 ?ADMISSION DATE:  03/02/2021, CONSULTATION DATE:  03/17/2021 ?REFERRING MD:  Collene Gobble, MD CHIEF COMPLAINT:  SOB  ? ?History of Present Illness:  ?Pt is a 76 yo m w/ PMhx of dementia, bladder cancer presenting from SNF for SOB and emesis since early Tuesday AM. On arrival he was found to be somnolent w/ rhonchus breathing, sats in low 70s. Immediately intubated by ED MD and found feculent emesis throughout oropharynx and airway. Upon OGT placement had ~ 1L of immediate output. Per reports pt had associated tachypnea, fever and emesis since early Tuesday AM as well. No family present.  ? ?CT ab/pelvis on my review concerning for pneumatosis & SBO involving left inguinal & scrotum. Final read pending. ED MD was able to reduce palpable anterior abdominal wall portion however scrotum remains full. ED nursing unable to pass foley despite multiple attempts.  ? ?On 3/11 patient was transferred to IMTS for continued care.  Over the next few days patient has been having trouble spitting his secretions, he is at high risk of aspiration and apparently he is slowly aspirating. ?On 3/14 rapid response was called, patient was noted to be drooling and his own secretions, fighting and swinging at the staff to avoid suctioning, he remained confused and encephalopathic, PCCM was consulted for help evaluation and management ? ?Patient's family was contacted about goals of care discussion, they would want to continue full scope of care including reintubation and proceeding to tracheostomy ? ?Pertinent  Medical History  ?Dementia  ?? Hx of Bladder cancer  ? ?Significant Hospital Events: ?Including procedures, antibiotic start and stop dates in addition to other pertinent events   ?3/2: admission for septic shock; central line and a-line placed ?3/3: decreased pressor and vent requirement. Echo with normal EF; G2DD. Moderate RV enlargement; P/F 153 ?3/4: off levo; on  vaso alone. P/F 186 ?3/5: off pressors ?3/6: ESBL e.coli on UC. Transitioned to [facility].  ?3/7: inguinal hernia repair, no immediate complications. LUE duplex negative for DVT. CXR>improving infiltrates ?3/8: extubated; feeds started, a-line removed ?3/9: Surgery signed off ?3/11: on room air ?3/14 had recurrent aspiration and respirator distress, was fighting at suction attempts, transferred to ICU for closer monitoring and concern would require intubation ? ?Interim History / Subjective:  ?Patient was pleasant this morning.  He denies any issue or complaint. ?He is not oriented to self ? ?Objective   ?Blood pressure (!) 142/81, pulse 89, temperature 98.7 ?F (37.1 ?C), temperature source Axillary, resp. rate (!) 30, height '5\' 8"'$  (1.727 m), weight 74.8 kg, SpO2 94 %. ?   ?   ? ?Intake/Output Summary (Last 24 hours) at 03/17/2021 0749 ?Last data filed at 03/17/2021 0700 ?Gross per 24 hour  ?Intake 1650 ml  ?Output 950 ml  ?Net 700 ml  ? ? ?Filed Weights  ? 03/14/21 1600 03/16/21 0447 03/17/21 0346  ?Weight: 72.6 kg 76.2 kg 74.8 kg  ? ? ?Examination: ?General: Adult male, chronically ill, resting in bed, in NAD.  Patient was eating breakfast during examination.  He did had an episode of coughing and inability to clear out his secretions. ?Neuro: Awake, tracks, answers basic questions.  AO x 0 ?HEENT: [place]/AT. Sclerae anicteric.  ?Cardiovascular: RRR, no M/R/G. ?Lungs: In no respiratory distress.  ?Abdomen: Soft, nontender ?Musculoskeletal: No gross deformities, no edema. ?Skin: Intact, warm, no rashes. ? ?Resolved Hospital Problem list   ?Septic shock secondary to aspiration pna vs e.coli UTI ?  ARDS ?Hypernatremia ?SBO--started passing stool 3/9 ?Inguinal hernia--s/p repair 3/7 ?AKI--resolved 3/11 ?ESBL E.coli UTI ?Transient atrial fibrillation ? ?Assessment & Plan:  ?Recurrent aspiration pneumonia - s/p 7d Meropenem completed 3/13.  ?Inability to control secretions 2/2 advanced dementia ?Inability to control secretions  secondary to advanced dementia.   ?He passed a swallow test today and was placed on dysphagia 3.  He did had an episode of coughing and possible aspiration while eating breakfast this morning.  Overall I do not think that he can consistently control his secretions. ?I spoke to patient's daughter yesterday and explained that placing a trach and a PEG would not necessarily improve his quality of life.  I will meet with patient's daughter this morning to continue goals of care conversation. ?-Continue intermittent suction as needed ?-Dysphagia 3 diet ? ?Moderate Malnutrition  ?Nutrition with tube feeding and dysphagia 3 diet ? ?Elevated creatinine ?Increase tube feed to goal ?Continue p.o. intake ? 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 ?- Voiding trial as an outpatient per urology.  ? ?Advanced dementia  ?At baseline ambulates, AAOx1. Needs ADL assistance and minimally communicative. ?- Continue supportive care. ?- Avoid sedation. ?- Delirium precautions ? ?Microcytic anemia with low iron and TIBC. ?- Supplement once decision on PEG made. ? ?Transient atrial fibrillation ?Currently in sinus rhythm ?Do not believe he is a great candidate for anticoagulation ? ?Best Practice (right click and "Reselect all SmartList Selections" daily)  ? ?Diet/type: Dysphagia 3 ?DVT prophylaxis: Subcu Lovenox ?GI prophylaxis: H2B ?Lines: N/A ?Foley:  Yes, and it is still needed per urology ?Code Status:  full code  ?We will update family ? ?Gaylan Gerold, DO ? ?

## 2021-03-17 NOTE — Progress Notes (Signed)
We had a long discussion this afternoon with the patient's daughter- Jason [name], Kathie Rhodes with Palliative team and [place]. Patient's ex-wife was also on the phone.  ? ?I went over the patient's clinical course and his inability of controlling secretion in the setting of advanced dementia. I explained that placing a Trach and PEG will not necessarily improve his quality of life and may cause more suffering. [place] voiced understanding. ? ?We have decided to proceed with comfort care and avoid any aggressive interventions. Will make him DNR. Continue comfort feed as patient can tolerates. Palliative team will help with transition to outpatient hospice back to [facility].  ? ?In the mean time, patient will be transferred back to IMTS service.  ?

## 2021-03-17 NOTE — Progress Notes (Addendum)
Speech Language Pathology Treatment: Dysphagia  ?Patient Details ?Name: Jason [name] ?MRN: [id number] ?DOB: Sep 19, 1945 ?Today's Date: 03/17/2021 ?Time: 8032-1224 ?SLP Time Calculation (min) (ACUTE ONLY): 22 min ? ?Assessment / Plan / Recommendation ?Clinical Impression ? Pt alert, just finished PT/OT, sounds congested with wet vocal quality at baseline. Family at bedside; she and I encouraged pt in productive coughing which he was able to do when requested. Pt positioned fully upright and took sips of coffee without immediate coughing, though again delayed coughing still occurred, as seen on MBS (pt coughing frequently without aspiration of PO). Pt consumed most of Dys 3 breakfast try with RN assist who also reported congested coughing. Reinforced with family that pt is capable of eating, enjoys eating, did not aspirate on yesterdays test, but will have risk of aspiration given dementia and waxing and waning mentation and strength, as all pts with advanced dementia will have. We also reviewed risks and benefit of alternative means of nutrition. Given pts general improvement and enjoyment of meals would encourage working toward consistent oral intake with precautions.  ?  ?HPI HPI: 76 yo m presenting from SNF 03/02/21 for SOB, fever and emesis since 03/07/21. On arrival he was found to be somnolent w/ rhonchus breathing, sats in low 70s. Immediately intubated by ED MD and found feculent emesis throughout oropharynx and airway. found to have incarcerated LIH with SBO and aspiration PNA s/p Left inguinal hernia repair with Ultrapro mesh Extubated 03/08/21 PMhx of dementia, bladder cancer ?  ?   ?SLP Plan ? Continue with current plan of care ? ?  ?  ?Recommendations for follow up therapy are one component of a multi-disciplinary discharge planning process, led by the attending physician.  Recommendations may be updated based on patient status, additional functional criteria and insurance authorization. ?  ? ?Recommendations   ?Diet recommendations: Dysphagia 3 (fine chop);Thin liquid ?Liquids provided via: Cup;Straw ?Medication Administration: Whole meds with puree ?Supervision: Patient able to self feed ?Compensations: Slow rate;Small sips/bites;Follow solids with liquid ?Postural Changes and/or Swallow Maneuvers: Seated upright 90 degrees  ?   ?    ?   ? ? ? ? Oral Care Recommendations: Oral care BID ?Follow Up Recommendations: Skilled nursing-short term rehab (<3 hours/day) ?Plan: Continue with current plan of care ? ? ? ? ?  ?  ? ?Herbie Baltimore, MA CCC-SLP  ?Acute Rehabilitation Services ?[facility] Preferred ?Office [phone] ? ?Shelby Anderle, Katherene Ponto ? ?03/17/2021, 9:47 AM ?

## 2021-03-17 NOTE — Progress Notes (Signed)
Nutrition Follow-up ? ?DOCUMENTATION CODES:  ? ?Non-severe (moderate) malnutrition in context of chronic illness ? ?INTERVENTION:  ? ?Transition pt to nocturnal tube feeds via post-pyloric Cortrak: ?- Jevity 1.5 @ 70 ml/hr x 12 hours from 1800 to 0600 (total of 840 ml) ?- Free water flushes of 100 ml q 6 hours ? ?Nocturnal tube feeding regimen provides 1260 kcal, 54 grams of protein, and 638 ml of H2O (meets 63% of kcal needs and 54% of protein needs). ? ?Total free water with flushes: 1038 ml ? ?- If pt continues with good PO intake over the next 24-48 hours, recommend removing Cortrak tube and discontinuing nocturnal tube feeding orders ? ?- Mighty Shake TID with meals, each supplement provides 330 kcal and 9 grams of protein ? ?- Encourage PO intake ? ?NUTRITION DIAGNOSIS:  ? ?Moderate Malnutrition related to chronic illness (COPD) as evidenced by moderate fat depletion, moderate muscle depletion. ? ?Ongoing, being addressed via diet advancement and nocturnal tube feeds ? ?GOAL:  ? ?Patient will meet greater than or equal to 90% of their needs ? ?Progressing ? ?MONITOR:  ? ?PO intake, Supplement acceptance, Labs, Weight trends, TF tolerance, Skin, I & O's ? ?REASON FOR ASSESSMENT:  ? ?Ventilator ?  ? ?ASSESSMENT:  ? ?76 yo male admitted with SBO with incarcerated hernia. PMH includes dementia, bladder cancer, COPD, HTN, stroke. ? ?03/07 - s/p L inguinal hernia repair with mesh ?03/08 - extubated, Cortrak placed (tip gastric) ?03/14 - tube feeds discontinued due to concern for aspiration ?03/15 - Cortrak advanced (tip at LOT) ?03/16 - MBS, diet advanced to dysphagia 3 with thin liquids ? ?Discussed pt with RN and during ICU rounds. Per RN, pt consumed 90% of breakfast this morning and 75% of dinner last night. Per SLP note, pt took sips of coffee without immediate coughing but did have delayed coughing as was visualized on MBS (pt coughing frequently without aspiration of PO). Per SLP note, pt did not aspirate  during MBS yesterday but will have risk of aspiration given dementia and waxing and waning mentation and strength. ? ?RD to transition pt to nocturnal tube feeds to run overnight for 12 hours. Will add oral nutrition supplements to aid pt in meeting kcal and protein needs via PO route. If pt continues with good PO intake over the next 24 hours, can consider Cortrak removal to enhance pt comfort. ? ?CCM MD still discussing [place] with family. ? ?Admit weight: 75 kg ?Current weight: 74.8 kg ? ?Current TF: Jevity 1.5 @ 55 ml/hr, ProSource TF 45 ml BID, free water flushes 100 ml q 6 hours ? ?Meal Completion: 75% x 1 documented meal ? ?Medications reviewed and include: pepcid, SSI q 4 hours ? ?Labs reviewed: BUN 34, creatinine 1.34 ?CBG's: 121-167 x 24 hours ? ?UOP: 950 ml x 24 hours ?I/O's: +3.6 L since admit ? ?Diet Order:   ?Diet Order   ? ?       ?  DIET DYS 3 Room service appropriate? Yes; Fluid consistency: Thin  Diet effective now       ?  ? ?  ?  ? ?  ? ? ?EDUCATION NEEDS:  ? ?Not appropriate for education at this time ? ?Skin:  Skin Assessment: ?Skin Integrity Issues: ?Stage I: L heel ?Incisions: closed abdomen ?Other: MASD scrotum, skin tear scrotum ? ?Last BM:  03/16/21 large type 6 ? ?Height:  ? ?Ht Readings from Last 1 Encounters:  ?03/02/21 '5\' 8"'$  (1.727 m)  ? ? ?Weight:  ? ?  Wt Readings from Last 1 Encounters:  ?03/17/21 74.8 kg  ? ? ?BMI:  Body mass index is 25.09 kg/m?. ? ?Estimated Nutritional Needs:  ? ?Kcal:  2000-2200 ? ?Protein:  100-120 gm ? ?Fluid:  >/= 2 L ? ? ? ?Gustavus Bryant, MS, RD, LDN ?Inpatient Clinical Dietitian ?Please see AMiON for contact information. ? ?

## 2021-03-17 NOTE — [place] FL2 (Signed)
?[place] MEDICAID FL2 LEVEL OF CARE SCREENING TOOL  ?  ? ?IDENTIFICATION  ?Patient Name: ?Jason [name] Birthdate: 09/05/1945 Sex: male Admission Date (Current Location): ?03/02/2021  ?County and Medicaid Number: ? [place] ?  Facility and Address:  ?[facility], [address], [place], [place] [address] ?     Provider Number: ?[id number]  ?Attending Physician Name and Address:  ?Mannam, Praveen, MD ? Relative Name and Phone Number:  ?Dueitt,Voncella (Daughter)   [phone] ?   ?Current Level of Care: ?Hospital Recommended Level of Care: ?Skilled Nursing Facility Prior Approval Number: ?  ? ?Date Approved/Denied: ?  PASRR Number: ?[id number] ? ?Discharge Plan: ?SNF ?  ? ?Current Diagnoses: ?Patient Active Problem List  ? Diagnosis Date Noted  ? Palliative care status 03/17/2021  ? Comfort measures only status 03/17/2021  ? Anemia 03/17/2021  ? Atrial fibrillation, transient (HCC) 03/17/2021  ? Aspiration into respiratory tract   ? Malnutrition of moderate degree 03/03/2021  ? Incarcerated hernia   ? Pressure injury of skin 01/30/2018  ? Small bowel obstruction (HCC) 01/27/2018  ? Inguinal hernia 01/27/2018  ? Acute esophagitis 01/27/2018  ? Hematemesis 01/27/2018  ? Pneumonia 12/15/2015  ? Acute encephalopathy 03/07/2013  ? Postoperative intra-abdominal abscess 03/07/2013  ? Hydronephrosis, bilateral 03/07/2013  ? Pressure ulcer, stage III(707.23) 03/07/2013  ? UTI (lower urinary tract infection) 02/18/2013  ? Septic shock (HCC) 02/18/2013  ? Aspiration pneumonia (HCC) 02/18/2013  ? Acute hypoxemic respiratory failure (HCC) 02/18/2013  ? Acute renal failure (HCC) 02/18/2013  ? Dementia (HCC) 02/18/2013  ? Hematuria 02/18/2013  ? 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 Memory loss 03/27/2009  ? CELLULITIS, HAND, LEFT 03/23/2009  ? FATIGUE 03/15/2009  ? ALCOHOL ABUSE 01/30/2008  ? GOITER 10/25/2006  ? 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 HYPERTENSION 10/25/2006  ? COLONIC POLYPS, HX OF  10/25/2006  ? BENIGN PROSTATIC HYPERTROPHY, HX OF 10/25/2006  ? ? ?Orientation RESPIRATION BLADDER Height & Weight   ?  ?Self ? Normal Incontinent Weight: 165 lb (74.8 kg) ?Height:  5' 8" (172.7 cm)  ?BEHAVIORAL SYMPTOMS/MOOD NEUROLOGICAL BOWEL NUTRITION STATUS  ?    Incontinent Diet (see d/c summary)  ?AMBULATORY STATUS COMMUNICATION OF NEEDS Skin   ?Total Care   PU Stage and Appropriate Care ?  ?  ?  ?    ?     ?     ? ? ?Personal Care Assistance Level of Assistance  ?Total care   ?  ?  ?Total Care Assistance: Maximum assistance  ? ?Functional Limitations Info  ?Sight, Hearing, Speech Sight Info: Adequate ?Hearing Info: Adequate ?Speech Info: Adequate  ? ? ?SPECIAL CARE FACTORS FREQUENCY  ?    ?  ?  ?  ?  ?  ?  ?   ? ? ?Contractures Contractures Info: Not present  ? ? ?Additional Factors Info  ?Code Status, Allergies Code Status Info: DNR ?Allergies Info: nka ?  ?  ?  ?   ? ?Current Medications (03/17/2021):  This is the current hospital active medication list ?Current Facility-Administered Medications  ?Medication Dose Route Frequency Provider Last Rate Last Admin  ? 0.9 %  sodium chloride infusion  250 mL Intravenous Continuous Wakefield, Matthew, MD   Stopped at 03/13/21 1534  ? acetaminophen (TYLENOL) suppository 650 mg  650 mg Rectal Q4H PRN Wakefield, Matthew, MD   650 mg at 03/07/21 0556  ? antiseptic oral rinse (BIOTENE) solution 15 mL  15 mL Topical TID Shaffer, Shae Lee N, NP      ?   chlorhexidine gluconate (MEDLINE KIT) (PERIDEX) 0.12 % solution 15 mL  15 mL Mouth Rinse BID Wakefield, Matthew, MD   15 mL at 03/17/21 0746  ? Chlorhexidine Gluconate Cloth 2 % PADS 6 each  6 each Topical Daily Wakefield, Matthew, MD   6 each at 03/17/21 1052  ? enoxaparin (LOVENOX) injection 40 mg  40 mg Subcutaneous Q24H Chand, Sudham, MD   40 mg at 03/16/21 2136  ? famotidine (PEPCID) tablet 20 mg  20 mg Per Tube Daily Chand, Sudham, MD   20 mg at 03/17/21 1053  ? Gerhardt's butt cream   Topical BID Herrick, Benjamin W, MD    Given at 03/17/21 1053  ? glycopyrrolate (ROBINUL) tablet 1 mg  1 mg Oral Q4H PRN Shaffer, Shae Lee N, NP      ? Or  ? glycopyrrolate (ROBINUL) injection 0.2 mg  0.2 mg Subcutaneous Q4H PRN Shaffer, Shae Lee N, NP      ? Or  ? glycopyrrolate (ROBINUL) injection 0.2 mg  0.2 mg Intravenous Q4H PRN Shaffer, Shae Lee N, NP      ? guaiFENesin (ROBITUSSIN) 100 MG/5ML liquid 10 mL  10 mL Per Tube Q6H Steen, James, MD   10 mL at 03/17/21 1053  ? haloperidol (HALDOL) tablet 2 mg  2 mg Oral Q4H PRN Shaffer, Shae Lee N, NP      ? Or  ? haloperidol (HALDOL) 2 MG/ML solution 2 mg  2 mg Sublingual Q4H PRN Shaffer, Shae Lee N, NP      ? Or  ? haloperidol lactate (HALDOL) injection 2 mg  2 mg Intravenous Q4H PRN Shaffer, Shae Lee N, NP      ? hydrALAZINE (APRESOLINE) injection 5 mg  5 mg Intravenous Q6H PRN Kamat, Sunil G, MD      ? ipratropium-albuterol (DUONEB) 0.5-2.5 (3) MG/3ML nebulizer solution 3 mL  3 mL Nebulization Q4H PRN Williams, Julie Anne, MD      ? LORazepam (ATIVAN) tablet 1 mg  1 mg Oral Q4H PRN Shaffer, Shae Lee N, NP      ? Or  ? LORazepam (ATIVAN) 2 MG/ML concentrated solution 1 mg  1 mg Sublingual Q4H PRN Shaffer, Shae Lee N, NP      ? Or  ? LORazepam (ATIVAN) injection 1 mg  1 mg Intravenous Q4H PRN Shaffer, Shae Lee N, NP      ? MEDLINE mouth rinse  15 mL Mouth Rinse BID Chand, Sudham, MD   15 mL at 03/17/21 1053  ? morphine (PF) 2 MG/ML injection 2 mg  2 mg Intravenous Q1H PRN Shaffer, Shae Lee N, NP      ? polyvinyl alcohol (LIQUIFILM TEARS) 1.4 % ophthalmic solution 1 drop  1 drop Both Eyes QID PRN Shaffer, Shae Lee N, NP      ? sertraline (ZOLOFT) tablet 25 mg  25 mg Per Tube Daily Chand, Sudham, MD   25 mg at 03/17/21 1053  ? ? ? ?Discharge Medications: ?Please see discharge summary for a list of discharge medications. ? ?Relevant Imaging Results: ? ?Relevant Lab Results: ? ? ?Additional Information ?[id number]; ? ? F , LCSWA ? ? ? ? ?

## 2021-03-18 NOTE — Progress Notes (Signed)
SLP Cancellation Note ? ?Patient Details ?Name: Jason [name] ?MRN: [id number] ?DOB: January 09, 1945 ? ? ?Cancelled treatment:  Pt has transitioned to comfort care.  SLP completed family education yesterday. Our service will respectfully sign off. ? ?Rushi Chasen L. Meztli Llanas, MA CCC/SLP ?Acute Rehabilitation Services ?Office number [phone] ?Pager [phone] ?       ? ? ?Jason [name] ?03/18/2021, 2:50 PM ?

## 2021-03-18 NOTE — Progress Notes (Signed)
?                                                   ?Daily Progress Note  ? ?Patient Name: Jason [name]       Date: 03/18/2021 ?DOB: December 19, 1945  Age: 76 y.o. MRN#: [id number] ?Attending Physician: Lottie Mussel, MD ?Primary Care Physician: Janifer Adie, MD ?Admit Date: 03/02/2021 ? ?Reason for Consultation/Follow-up: Establishing goals of care ? ?Subjective: ?Denies complaints today.  Tells me he is comfortable.  Remains disoriented.  Per RN no concerns. ?Length of Stay: 16 ? ?Current Medications: ?Scheduled Meds:  ? antiseptic oral rinse  15 mL Topical TID  ? chlorhexidine gluconate (MEDLINE KIT)  15 mL Mouth Rinse BID  ? Chlorhexidine Gluconate Cloth  6 each Topical Daily  ? famotidine  20 mg Per Tube Daily  ? Gerhardt's butt cream   Topical BID  ? guaiFENesin  10 mL Per Tube Q6H  ? mouth rinse  15 mL Mouth Rinse BID  ? sertraline  25 mg Per Tube Daily  ? ? ?Continuous Infusions: ? sodium chloride Stopped (03/13/21 1534)  ? ? ?PRN Meds: ?acetaminophen, glycopyrrolate **OR** glycopyrrolate **OR** glycopyrrolate, haloperidol **OR** haloperidol **OR** haloperidol lactate, hydrALAZINE, ipratropium-albuterol, LORazepam **OR** LORazepam **OR** LORazepam, morphine injection, polyvinyl alcohol ? ?Physical Exam ?Constitutional:   ?   General: He is not in acute distress. ?   Appearance: He is ill-appearing.  ?   Comments: Frail and thin ?  ?Pulmonary:  ?   Effort: Pulmonary effort is normal.  ?Skin: ?   General: Skin is warm and dry.  ?Neurological:  ?   Mental Status: He is alert.  ?         ? ?Vital Signs: BP 130/60   Pulse 83   Temp 97.6 ?F (36.4 ?C) (Oral)   Resp 20   Ht $R'5\' 8"'eV$  (1.727 m)   Wt 74.8 kg   SpO2 93%   BMI 25.09 kg/m?  ?SpO2: SpO2: 93 % ?O2 Device: O2 Device: Room Air ?O2 Flow Rate: O2 Flow Rate (L/min): 6 L/min ? ?Intake/output summary:  ?Intake/Output Summary  (Last 24 hours) at 03/18/2021 1017 ?Last data filed at 03/17/2021 1813 ?Gross per 24 hour  ?Intake 230 ml  ?Output 200 ml  ?Net 30 ml  ? ? ?LBM: Last BM Date : 03/17/21 ?Baseline Weight: Weight: 75 kg ?Most recent weight: Weight: 74.8 kg ? ?     ?Palliative Assessment/Data: PPS 40% ? ? ? ?Flowsheet Rows   ? ?Flowsheet Row Most Recent Value  ?Intake Tab   ?Referral Department Critical care  ?Unit at Time of Referral ICU  ?Palliative Care Primary Diagnosis Sepsis/Infectious Disease  ?Date Notified 03/14/21  ?Palliative Care Type New Palliative care  ?Reason for referral Clarify Goals of Care  ?Date of Admission 03/02/21  ?Date first seen by Palliative Care 03/15/21  ?# of days Palliative referral response time 1 Day(s)  ?# of days IP prior to Palliative referral 12  ?Clinical Assessment   ?Psychosocial & Spiritual Assessment   ?Palliative Care Outcomes   ? ?  ? ? ?Patient Active Problem List  ? Diagnosis Date Noted  ? Palliative care status 03/17/2021  ? Comfort measures only status 03/17/2021  ? Anemia 03/17/2021  ? Atrial fibrillation, transient ([place]) 03/17/2021  ? Aspiration into respiratory tract   ? Malnutrition of moderate  degree 03/03/2021  ? Incarcerated hernia   ? Pressure injury of skin 01/30/2018  ? Small bowel obstruction ([place]) 01/27/2018  ? Inguinal hernia 01/27/2018  ? Acute esophagitis 01/27/2018  ? Hematemesis 01/27/2018  ? Pneumonia 12/15/2015  ? Acute encephalopathy 03/07/2013  ? Postoperative intra-abdominal abscess 03/07/2013  ? Hydronephrosis, bilateral 03/07/2013  ? Pressure ulcer, stage AST(419.62) 03/07/2013  ? UTI (lower urinary tract infection) 02/18/2013  ? Septic shock ([place]) 02/18/2013  ? Aspiration pneumonia ([place]) 02/18/2013  ? Acute hypoxemic respiratory failure ([place]) 02/18/2013  ? Acute renal failure ([place]) 02/18/2013  ? Dementia ([place]) 02/18/2013  ? Hematuria 02/18/2013  ? Urothelial carcinoma ([place]) 02/18/2013  ? Memory loss 03/27/2009  ? CELLULITIS, HAND, LEFT 03/23/2009  ? FATIGUE  03/15/2009  ? ALCOHOL ABUSE 01/30/2008  ? GOITER 10/25/2006  ? 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 HYPERTENSION 10/25/2006  ? COLONIC POLYPS, HX OF 10/25/2006  ? BENIGN PROSTATIC HYPERTROPHY, HX OF 10/25/2006  ? ? ?Palliative Care Assessment & Plan  ? ?HPI: ?76 y.o. male  with past medical history of dementia, bladder cancer, and COPD admitted on 03/02/2021 with shortness of breath and emesis.  Required immediate intubation in ED.  Found to have severe small bowel obstruction.  Also found to have UTI.  Underwent inguinal hernia repair March 7.  Was extubated March 8.  March 14 had recurrent aspiration and respiratory distress required transfer to ICU for closer monitoring.  PMT consulted for goals of care discussions. ? ?Of note, patient is with [facility] and goals of care conversations have been had between [facility] and daughter-daughter has requested full scope care. ? ?Assessment: ?Patient changed to comfort care measures only yesterday. ?Patient has been moved to the 6 N. out of the ICU. ?Feeding tube has been removed and patient appears much more comfortable, less restless and agitated.  No comfort medications required overnight and appears comfortable this a.m. during my assessment.  No concerns per RN. ?Plan is to discharge back to [facility] on Monday with hospice support ? ?Recommendations/Plan: ?Code status change to DNR ?Continue comfort measures only ?Move to [facility] with hospice support-likely Monday ? ?Code Status: ?DNR ? ?Discharge Planning: ?[place] with Hospice ? ?Care plan was discussed with RN ? ?Thank you for allowing the Palliative Medicine Team to assist in the care of this patient. ? ? ?*Please note that this is a verbal dictation therefore any spelling or grammatical errors are due to the "[place] One" system interpretation. ? ?Juel Burrow, DNP, AGNP-C ?Palliative Medicine Team ?Team Phone # [phone]  ?Pager [phone] ? ?

## 2021-03-18 NOTE — Progress Notes (Signed)
Pt is injury free, afebrile, alert, and oriented to self. Pt is comfortable with no s/s of distress.He had a restful night without any acute changes. Will continue to monitor  ?

## 2021-03-18 NOTE — Progress Notes (Signed)
? ? ?  Subjective:  ?Overnight Events: Patient transitioned to comfort care 3/17.No acute events overnight. IMTS took over as primary at 7am this morning. ? ?Patient awakens to voice.  He allows me to straighten him in bed and put the pillow under his head.  He does not express any concerns, attentive but does not talk.  ? ?Objective:  ?Vital signs in last 24 hours: ?Vitals:  ? 03/17/21 1218 03/17/21 1400 03/17/21 1500 03/17/21 1756  ?BP:  131/66 123/61 130/60  ?Pulse:  86 82 83  ?Resp:  20 (!) 24 20  ?Temp: 98.3 ?F (36.8 ?C)   97.6 ?F (36.4 ?C)  ?TempSrc: Axillary   Oral  ?SpO2:  94% 94% 93%  ?Weight:      ?Height:      ? ? ?Physical Exam:  ? ?General: Frail and chronically ill-appearing man lying in bed ?PULM: Normal work of breathing on room air ?CV: RRR, no mgr ?GI: BS+, soft, nontender ?Neuro: Awakens to voice, alert, does not answer questions of person place or time ? ?Assessment/Plan:  ? ?Principal Problem: ?  Small bowel obstruction ([place]) ?Active Problems: ?  Septic shock ([place]) ?  Acute hypoxemic respiratory failure ([place]) ?  Malnutrition of moderate degree ?  Aspiration into respiratory tract ?  Palliative care status ?  Comfort measures only status ?  Anemia ?  Atrial fibrillation, transient ([place]) ? ? ?Patient Summary ? ?Hospital day #36 for 76 year old [place] living with advanced dementia and bladder cancer s/p resection, [facility] and a resident at [facility] facility was admitted for acute hypoxic and hypercapnic respiratory failure from klebsiella aspiration pneumonia in setting of SBO from incarcerated bowel inguinal hernia now s/p repair. He was also found to have sepsis from ESBL E. coli UTI s/p 7 days of meropenem.  Stay also complicated by urethral stricture status post Foley placement by urology. ? Patient transferred out of ICU 3/12 and transferred back to ICU 3/14 for recurrent aspiration.  Patient continued to show inability to protect his airway and continued to have  aspirations secondary to his advanced dementia.  He was combative with nursing when suctioning.  Goals of care were discussed with daughter, Jearld Hemp, and patient has been transition to comfort care and made DNR.  Plan to transfer patient back to [facility] on hospice Monday 3/20. ? ?-Continue comfort care order set ?- Full supervision with snacks and meals ? ? ?Diet:  Dysphagia 3 ?IVF: None,None ?VTE: None ?Code: DNR ?PT/OT recs: SNF with hospice ?TOC recs: Return to [facility] on Monday with hospice care ? ? ?Dispo: Anticipated discharge to Skilled nursing facility in 2 days pending transfer to [facility].  ? ?Tamsen Snider, MD ?PGY3 Internal Medicine ?Pager: [phone] ?Please contact the on call pager after 5 pm and on weekends at [phone]. ? ?

## 2021-03-19 NOTE — Progress Notes (Signed)
? ? ?  Subjective:  ?Patient transitioned to comfort care 3/17. ?Overnight Events: NAEON ? ?On assessment, patient is alert and smiling, shaking hands with members of our team. He is able to say "no" to being in pain or trouble breathing today. He otherwise makes no meaningful conversation. Appears to understand when informed that he will return to [facility] tomorrow. ? ?Objective:  ?Vital signs in last 24 hours: ?Vitals:  ? 03/17/21 1500 03/17/21 1756 03/18/21 2030 03/19/21 0456  ?BP: 123/61 130/60 (!) 143/84 (!) 144/73  ?Pulse: 82 83 83 81  ?Resp: (!) '24 20 20 17  '$ ?Temp:  97.6 ?F (36.4 ?C) 98.4 ?F (36.9 ?C) 98 ?F (36.7 ?C)  ?TempSrc:  Oral Oral Oral  ?SpO2: 94% 93% 93% 98%  ?Weight:      ?Height:      ? ? ?Physical Exam:  ? ?General: Frail and chronically ill-appearing man lying in bed ?PULM: Normal work of breathing on room air ?CV: RRR, no mgr ?GI: BS+, soft, nontender ?Neuro: Alert, does not answer question of person. ?Psych: Bright affect ? ?Assessment/Plan:  ? ?Principal Problem: ?  Small bowel obstruction ([place]) ?Active Problems: ?  Septic shock ([place]) ?  Acute hypoxemic respiratory failure ([place]) ?  Malnutrition of moderate degree ?  Aspiration into respiratory tract ?  Palliative care status ?  Comfort measures only status ?  Anemia ?  Atrial fibrillation, transient ([place]) ? ? ?Patient Summary ? ?Hospital day #79 for 76 year old [place] living with advanced dementia and bladder cancer s/p resection, followed by [facility] and a resident at [facility] facility was admitted for acute hypoxic and hypercapnic respiratory failure from klebsiella aspiration pneumonia in setting of SBO from incarcerated bowel inguinal hernia now s/p repair. He was also found to have sepsis from ESBL E. coli UTI s/p 7 days of meropenem.  Stay also complicated by urethral stricture status post Foley placement by urology. ? Patient transferred out of ICU 3/12 and transferred back to ICU 3/14 for recurrent aspiration.   Patient continued to show inability to protect his airway and continued to have aspirations secondary to his advanced dementia.  He was combative with nursing when suctioning.  Goals of care were discussed with daughter, Montee Tallman, and patient has been transition to comfort care and made DNR.  Plan to transfer patient back to [facility] on hospice Monday 3/20. ? ?-Continue comfort care order set ?- Full supervision with snacks and meals ? ? ?Diet:  Dysphagia 3 ?IVF: None,None ?VTE: None ?Code: DNR ?PT/OT recs: SNF with hospice ?TOC recs: Return to [facility] on Monday with hospice care ? ? ?Dispo: Anticipated discharge to Skilled nursing facility in 1 day pending transfer to [facility].  ? ?Rosezetta Schlatter, MD ?Internal Medicine/ Psych Intern ?Pager: [phone] ?Please contact the on call pager after 5 pm and on weekends at [phone]. ? ?

## 2021-03-19 NOTE — Plan of Care (Signed)
?  Problem: Education: ?Goal: Knowledge of the prescribed therapeutic regimen will improve ?Outcome: Not Progressing ?  ?Problem: Coping: ?Goal: Ability to identify and develop effective coping behavior will improve ?Outcome: Not Progressing ?  ?Problem: Clinical Measurements: ?Goal: Quality of life will improve ?Outcome: Not Progressing ?  ?Problem: Role Relationship: ?Goal: Family's ability to cope with current situation will improve ?Outcome: Progressing ?  ?Problem: Pain Management: ?Goal: Satisfaction with pain management regimen will improve ?Outcome: Progressing ?  ?

## 2021-03-19 NOTE — Plan of Care (Signed)
?  Problem: Clinical Measurements: ?Goal: Quality of life will improve ?Outcome: Not Progressing ?  ?Problem: Pain Management: ?Goal: Satisfaction with pain management regimen will improve ?Outcome: Progressing ?  ?

## 2021-03-20 MED ORDER — BIOTENE DRY MOUTH MT LIQD: 15.0000 mL | Freq: Three times a day (TID) | OROMUCOSAL | 0 refills | Status: DC

## 2021-03-20 MED ORDER — CHLORHEXIDINE GLUCONATE 0.12% ORAL RINSE (MEDLINE KIT): 15.0000 mL | Freq: Two times a day (BID) | OROMUCOSAL | 0 refills | Status: DC

## 2021-03-20 MED ORDER — MORPHINE SULFATE (PF) 2 MG/ML IV SOLN
2.0000 mg | INTRAVENOUS | 0 refills | Status: DC | PRN
Start: 2021-03-20 — End: 2021-10-04

## 2021-03-20 MED ORDER — ACETAMINOPHEN 650 MG RE SUPP: 650.0000 mg | RECTAL | 0 refills | Status: DC | PRN

## 2021-03-20 MED ORDER — GUAIFENESIN 100 MG/5ML PO LIQD: 10.0000 mL | Freq: Four times a day (QID) | ORAL | 0 refills | Status: DC

## 2021-03-20 MED ORDER — SERTRALINE HCL 25 MG PO TABS: 25.0000 mg | ORAL_TABLET | Freq: Every day | ORAL | 0 refills | Status: DC

## 2021-03-20 MED ORDER — ORAL CARE MOUTH RINSE: 15.0000 mL | Freq: Two times a day (BID) | OROMUCOSAL | 0 refills | Status: DC

## 2021-03-20 MED ORDER — GLYCOPYRROLATE 0.2 MG/ML IJ SOLN: 0.2000 mg | INTRAMUSCULAR | 0 refills | Status: DC | PRN

## 2021-03-20 MED ORDER — FAMOTIDINE 20 MG PO TABS: 20.0000 mg | ORAL_TABLET | Freq: Every day | ORAL | 0 refills | Status: DC

## 2021-03-20 MED ORDER — POLYVINYL ALCOHOL 1.4 % OP SOLN: 1.0000 [drp] | Freq: Four times a day (QID) | OPHTHALMIC | 0 refills | Status: DC | PRN

## 2021-03-20 MED ORDER — LORAZEPAM 2 MG/ML PO CONC
1.0000 mg | ORAL | 0 refills | Status: DC | PRN
Start: 2021-03-20 — End: 2021-10-04

## 2021-03-20 MED ORDER — GERHARDT'S BUTT CREAM: 1.0000 "application " | TOPICAL_CREAM | Freq: Two times a day (BID) | CUTANEOUS | 0 refills | Status: DC

## 2021-03-20 MED ORDER — HALOPERIDOL LACTATE 2 MG/ML PO CONC: 2.0000 mg | ORAL | 0 refills | Status: DC | PRN

## 2021-03-20 MED ORDER — HYDRALAZINE HCL 20 MG/ML IJ SOLN: 5.0000 mg | Freq: Four times a day (QID) | INTRAMUSCULAR | 0 refills | Status: DC | PRN

## 2021-03-20 MED ORDER — IPRATROPIUM-ALBUTEROL 0.5-2.5 (3) MG/3ML IN SOLN: 3.0000 mL | RESPIRATORY_TRACT | 0 refills | Status: DC | PRN

## 2021-03-20 NOTE — Progress Notes (Signed)
Patient ID:Jason [name]      DOB: 12-May-1945      YFV:[id number] ? ? ? ?  ?Palliative Medicine Team ? ? ? ? ?Subjective: Bedside symptom check. No family or visitors present at time of visit.  ? ? ?Physical exam: Patient resting comfortably in bed with eyes closed at time of visit. This RN did not attempt to wake patient, allowed him to continue to rest. No physical signs of pain, distress, discomfort noted. Breathing was even and non-labored without excessive secretions noted.  ? ? ?Assessment and plan: Patient to discharge back to facility with hospice when appropriate per medical team. Bedside RN denies any concerns or needs at this time. Will continue to follow for any changes or advances.  ? ? ? ?Thank you for allowing the Palliative Medicine Team to assist in the care of this patient. ?  ?  ?Damian Leavell, MSN, RN ?Palliative Medicine Team ?Team Phone: [phone]  ?This phone is monitored 7a-7p, please reach out to attending physician outside of these hours for urgent needs.   ?

## 2021-03-20 NOTE — Progress Notes (Incomplete)
Ritik Philipp Ovens to be D/C'd  per MD order.  Gave report to RN at [facility]. ? ?VSS, Skin clean, dry and intact without evidence of skin break down, no evidence of skin tears noted. ? ?IV catheter discontinued intact. Site without signs and symptoms of complications. Dressing and pressure applied. ? ?An After Visit Summary was printed and given to the [place] to give to [facility]. ? ?[facility] to transport. ?

## 2021-03-20 NOTE — Discharge Summary (Signed)
? ?Name: Jason [name] ?MRN: [id number] ?DOB: 04-Dec-1945 76 y.o. ?PCP: Janifer Adie, MD ? ?Date of Admission: 03/02/2021  4:51 AM ?Date of Discharge: 03/20/21 1:05 PM ?Attending Physician: Lottie Mussel, MD ? ?Discharge Diagnosis: ?1. Acute hypoxic and hypercapnic respiratory failure 2/2 Klebsiella aspiration pneumonia in the setting of SBO from incarcerated bowel inguinal hernia s/p hernia repair ?2.  ESBL E. coli UTI, resolved ?3.  Advanced dementia ?4.  Stage II and III pressure ulcers ? ?Discharge Medications: ?Allergies as of 03/20/2021   ?No Known Allergies ?  ? ?  ?Medication List  ?  ? ?STOP taking these medications   ? ?acetaminophen 325 MG tablet ?Commonly known as: TYLENOL ?Replaced by: acetaminophen 650 MG suppository ?  ?albuterol (2.5 MG/3ML) 0.083% nebulizer solution ?Commonly known as: PROVENTIL ?  ?collagenase 250 UNIT/GM ointment ?Commonly known as: SANTYL ?  ?docusate sodium 100 MG capsule ?Commonly known as: COLACE ?  ?feeding supplement (GLUCERNA SHAKE) Liqd ?  ?metFORMIN 500 MG tablet ?Commonly known as: GLUCOPHAGE ?  ?multivitamin with minerals Tabs tablet ?  ?NON FORMULARY ?  ?ondansetron 4 MG tablet ?Commonly known as: ZOFRAN ?  ?OXYGEN ?  ?pantoprazole 40 MG tablet ?Commonly known as: PROTONIX ?  ?thiamine 100 MG tablet ?  ? ?  ? ?TAKE these medications   ? ?acetaminophen 650 MG suppository ?Commonly known as: TYLENOL ?Place 1 suppository (650 mg total) rectally every 4 (four) hours as needed for fever or mild pain. ?Replaces: acetaminophen 325 MG tablet ?  ?antiseptic oral rinse Liqd ?Apply 15 mLs topically 3 (three) times daily. ?  ?mouth rinse Liqd solution ?15 mLs by Mouth Rinse route 2 (two) times daily. ?  ?chlorhexidine gluconate (MEDLINE KIT) 0.12 % solution ?Commonly known as: PERIDEX ?15 mLs by Mouth Rinse route 2 (two) times daily. ?  ?famotidine 20 MG tablet ?Commonly known as: PEPCID ?Place 1 tablet (20 mg total) into feeding tube daily. ?Start taking on: March 21, 2021 ?What  changed: how to take this ?  ?Gerhardt's butt cream Crea ?Apply 1 application. topically 2 (two) times daily. ?  ?glycopyrrolate 0.2 MG/ML injection ?Commonly known as: ROBINUL ?Inject 1 mL (0.2 mg total) into the skin every 4 (four) hours as needed (excessive secretions). ?  ?guaiFENesin 100 MG/5ML liquid ?Commonly known as: ROBITUSSIN ?Place 10 mLs into feeding tube every 6 (six) hours. ?  ?haloperidol 2 MG/ML solution ?Commonly known as: HALDOL ?Place 1 mL (2 mg total) under the tongue every 4 (four) hours as needed for agitation (or delirium). ?  ?hydrALAZINE 20 MG/ML injection ?Commonly known as: APRESOLINE ?Inject 0.25 mLs (5 mg total) into the vein every 6 (six) hours as needed (SVBP > 165). ?  ?ipratropium-albuterol 0.5-2.5 (3) MG/3ML Soln ?Commonly known as: DUONEB ?Take 3 mLs by nebulization every 4 (four) hours as needed. ?  ?LORazepam 2 MG/ML concentrated solution ?Commonly known as: ATIVAN ?Place 0.5 mLs (1 mg total) under the tongue every 4 (four) hours as needed for anxiety. ?  ?morphine (PF) 2 MG/ML injection ?Inject 1 mL (2 mg total) into the vein every hour as needed (or dyspnea). ?  ?polyvinyl alcohol 1.4 % ophthalmic solution ?Commonly known as: LIQUIFILM TEARS ?Place 1 drop into both eyes 4 (four) times daily as needed for dry eyes. ?  ?sertraline 25 MG tablet ?Commonly known as: ZOLOFT ?Place 1 tablet (25 mg total) into feeding tube daily. ?Start taking on: March 21, 2021 ?What changed: how to take this ?  ? ?  ? ? ?  Disposition and follow-up:   ?Mr.Jason [name] was discharged from [facility] in Serious condition.  At the hospital follow up visit please address: ? ?1.  Patient comfort care/DNR ? ?2.  Labs / imaging needed at time of follow-up: N/A ? ?3.  Pending labs/ test needing follow-up: N/A ? ?Follow-up Appointments: ? Follow-up Information   ? ? [facility], [place] Follow up on 03/30/2021.   ?Specialty: General Surgery ?Why: 245pm. Please arrive 30 minutes prior to  your appointment for paperwork. Please bring a copy of your photo ID and insurance card. ?Contact information: ?[address] ?[address] ?[place] [address] ?[phone] ? ? ?  ?  ? ?  ?  ? ?  ? ? ?Hospital Course: ?Patient with advanced dementia and bladder cancer s/p resection, [facility] and a resident at [facility] facility who was admitted for acute hypoxic and hypercapnic respiratory failure from klebsiella aspiration pneumonia in setting of SBO from incarcerated bowel inguinal hernia now s/p repair. He was also found to have sepsis from ESBL E. coli UTI s/p 7 days of meropenem.  Stay also complicated by urethral stricture status post Foley placement by urology. ? ?Patient was transferred out of ICU 3/12 and transferred back to ICU 3/14 for recurrent aspiration. He continued to show inability to protect his airway and continued to have aspirations secondary to his advanced dementia; RN with some difficulty suctioning. Goals of care were discussed with daughter, Jason [name], and patient has been transition to comfort care and made DNR.  ? ?Discharge Subjective: ?Patient awakened to voice, with bright affect, smiling and shaking hands with members of our team. Denies pain today. ? ?Discharge Exam:   ?BP (!) 142/73 (BP Location: Left Arm)   Pulse 96   Temp (!) 97.5 ?F (36.4 ?C) (Oral)   Resp 16   Ht $R'5\' 8"'og$  (1.727 m)   Wt 74.8 kg   SpO2 93%   BMI 25.09 kg/m?  ?Discharge exam:  ?General: Frail and chronically ill-appearing man lying in bed ?PULM: Normal work of breathing on room air ?Neuro: Awakens to voice, alert, does not answer questions of person place or time, but responds "yes" when offered his name.  ?Psych: Bright affect ? ?Pertinent Labs, Studies, and Procedures:  ?DG Swallow Function Screen ?Diet Recommendations 03/16/2021  ?SLP Diet Recommendations Dysphagia 3 (Mech soft) solids;Thin liquid  ?Liquid Administration via Straw  ?Medication Administration Whole meds with  liquid  ?Compensations Slow rate;Small sips/bites;Follow solids with liquid  ?Postural Changes Seated upright at 90 degrees  ?     ?  ?Other Recommendations 03/16/2021  ?Recommended Consults --  ?Oral Care Recommendations Oral care QID  ?Other Recommendations --  ?Follow Up Recommendations Skilled nursing-short term rehab (<3 hours/day)  ?Assistance recommended at discharge Frequent or constant Supervision/Assistance  ?Functional Status Assessment Patient has had a recent decline in their functional status and demonstrates the ability to make significant improvements in function in a reasonable and predictable amount of time.  ?   ? ? ?Discharge Instructions: ?Continue comfort care measures ?DNR ? ?Signed: ?Rosezetta Schlatter, MD ?03/20/2021, 12:15 PM   ?Pager: [phone] ?

## 2021-03-20 NOTE — TOC Transition Note (Signed)
Transition of Care (TOC) - CM/SW Discharge Note ? ? ?Patient Details  ?Name: Jason [name] ?MRN: [id number] ?Date of Birth: Feb 13, 1945 ? ?Transition of Care (TOC) CM/SW Contact:  ?Emeterio Reeve, LCSW ?Phone Number: ?03/20/2021, 11:48 AM ? ? ?Clinical Narrative:    ?  ?Per MD patient ready for DC to [facility], patient, patient's family, and facility notified of DC. Discharge Summary and FL2 sent to facility. DC packet on chart.  [facility] has approved return to SNF and [facility]. Ambulance transport requested for patient.  ?  ?RN to call report to [phone]. ? ?CSW will sign off for now as social work intervention is no longer needed. Please consult [place] again if new needs arise. ? ? ?Final next level of care: [facility] ?Barriers to Discharge: Barriers Resolved ? ? ?Patient Goals and CMS Choice ?  ?  ?Choice offered to / list presented to :  (NA-Pt is in long term care at [facility] and daughter would like for pt to return.) ? ?Discharge Placement ?  ?           ?Patient chooses bed at: [facility] ?Patient to be transferred to facility by: [facility] ?Name of family member notified: Daughter, [name] ?Patient and family notified of of transfer: 03/20/21 ? ?Discharge Plan and Services ?In-house Referral: Clinical Social Work ?  ?Post Acute Care Choice: [place]          ?  ?  ?  ?  ?  ?  ?  ?  ?  ?  ? ?Social Determinants of Health (SDOH) Interventions ?  ? ? ?Readmission Risk Interventions ?No flowsheet data found. ? ? ? ? ?

## 2021-05-31 ENCOUNTER — Other Ambulatory Visit: Payer: Self-pay | Admitting: Nurse Practitioner

## 2021-05-31 DIAGNOSIS — I739 Peripheral vascular disease, unspecified: Secondary | ICD-10-CM

## 2021-05-31 DIAGNOSIS — I700 Atherosclerosis of aorta: Secondary | ICD-10-CM

## 2021-06-09 ENCOUNTER — Other Ambulatory Visit: Payer: Medicare (Managed Care)

## 2021-10-02 ENCOUNTER — Inpatient Hospital Stay (HOSPITAL_COMMUNITY): Payer: Medicare (Managed Care)

## 2021-10-02 ENCOUNTER — Emergency Department (HOSPITAL_COMMUNITY): Payer: Medicare (Managed Care)

## 2021-10-02 ENCOUNTER — Other Ambulatory Visit: Payer: Self-pay

## 2021-10-02 ENCOUNTER — Encounter (HOSPITAL_COMMUNITY): Payer: Self-pay

## 2021-10-02 ENCOUNTER — Inpatient Hospital Stay (HOSPITAL_COMMUNITY)
Admission: EM | Admit: 2021-10-02 | Discharge: 2021-10-04 | DRG: 871 | Disposition: A | Discharge status: Skilled Nursing Facility | Payer: Medicare (Managed Care) | Source: Skilled Nursing Facility | Attending: Internal Medicine | Admitting: Internal Medicine

## 2021-10-02 DIAGNOSIS — E0590 Thyrotoxicosis, unspecified without thyrotoxic crisis or storm: Secondary | ICD-10-CM | POA: Diagnosis present

## 2021-10-02 DIAGNOSIS — Z7401 Bed confinement status: Secondary | ICD-10-CM

## 2021-10-02 DIAGNOSIS — J189 Pneumonia, unspecified organism: Secondary | ICD-10-CM | POA: Diagnosis not present

## 2021-10-02 DIAGNOSIS — R64 Cachexia: Secondary | ICD-10-CM | POA: Diagnosis present

## 2021-10-02 DIAGNOSIS — J9601 Acute respiratory failure with hypoxia: Secondary | ICD-10-CM | POA: Diagnosis not present

## 2021-10-02 DIAGNOSIS — Z7189 Other specified counseling: Secondary | ICD-10-CM | POA: Diagnosis not present

## 2021-10-02 DIAGNOSIS — Z993 Dependence on wheelchair: Secondary | ICD-10-CM | POA: Diagnosis not present

## 2021-10-02 DIAGNOSIS — E440 Moderate protein-calorie malnutrition: Secondary | ICD-10-CM | POA: Diagnosis not present

## 2021-10-02 DIAGNOSIS — J449 Chronic obstructive pulmonary disease, unspecified: Secondary | ICD-10-CM | POA: Diagnosis present

## 2021-10-02 DIAGNOSIS — F17210 Nicotine dependence, cigarettes, uncomplicated: Secondary | ICD-10-CM | POA: Diagnosis present

## 2021-10-02 DIAGNOSIS — G9341 Metabolic encephalopathy: Secondary | ICD-10-CM | POA: Diagnosis not present

## 2021-10-02 DIAGNOSIS — Z6824 Body mass index (BMI) 24.0-24.9, adult: Secondary | ICD-10-CM

## 2021-10-02 DIAGNOSIS — E119 Type 2 diabetes mellitus without complications: Secondary | ICD-10-CM | POA: Diagnosis not present

## 2021-10-02 DIAGNOSIS — Z8559 Personal history of malignant neoplasm of other urinary tract organ: Secondary | ICD-10-CM

## 2021-10-02 DIAGNOSIS — E872 Acidosis, unspecified: Secondary | ICD-10-CM | POA: Diagnosis present

## 2021-10-02 DIAGNOSIS — R6520 Severe sepsis without septic shock: Secondary | ICD-10-CM | POA: Diagnosis not present

## 2021-10-02 DIAGNOSIS — Z20822 Contact with and (suspected) exposure to covid-19: Secondary | ICD-10-CM | POA: Diagnosis present

## 2021-10-02 DIAGNOSIS — I110 Hypertensive heart disease with heart failure: Secondary | ICD-10-CM | POA: Diagnosis present

## 2021-10-02 DIAGNOSIS — N3001 Acute cystitis with hematuria: Secondary | ICD-10-CM

## 2021-10-02 DIAGNOSIS — Z8551 Personal history of malignant neoplasm of bladder: Secondary | ICD-10-CM

## 2021-10-02 DIAGNOSIS — F0390 Unspecified dementia without behavioral disturbance: Secondary | ICD-10-CM | POA: Diagnosis not present

## 2021-10-02 DIAGNOSIS — I4891 Unspecified atrial fibrillation: Secondary | ICD-10-CM | POA: Diagnosis present

## 2021-10-02 DIAGNOSIS — I5032 Chronic diastolic (congestive) heart failure: Secondary | ICD-10-CM | POA: Diagnosis present

## 2021-10-02 DIAGNOSIS — Z66 Do not resuscitate: Secondary | ICD-10-CM | POA: Diagnosis present

## 2021-10-02 DIAGNOSIS — J690 Pneumonitis due to inhalation of food and vomit: Secondary | ICD-10-CM | POA: Diagnosis present

## 2021-10-02 DIAGNOSIS — A419 Sepsis, unspecified organism: Secondary | ICD-10-CM | POA: Diagnosis not present

## 2021-10-02 DIAGNOSIS — Z8673 Personal history of transient ischemic attack (TIA), and cerebral infarction without residual deficits: Secondary | ICD-10-CM

## 2021-10-02 DIAGNOSIS — N390 Urinary tract infection, site not specified: Secondary | ICD-10-CM | POA: Diagnosis present

## 2021-10-02 DIAGNOSIS — Z515 Encounter for palliative care: Secondary | ICD-10-CM

## 2021-10-02 LAB — COMPREHENSIVE METABOLIC PANEL
ALT: 16 U/L (ref 0–44)
AST: 26 U/L (ref 15–41)
Albumin: 2.8 g/dL — ABNORMAL LOW (ref 3.5–5.0)
Alkaline Phosphatase: 114 U/L (ref 38–126)
Anion gap: 8 (ref 5–15)
BUN: 23 mg/dL (ref 8–23)
CO2: 25 mmol/L (ref 22–32)
Calcium: 8.6 mg/dL — ABNORMAL LOW (ref 8.9–10.3)
Chloride: 105 mmol/L (ref 98–111)
Creatinine, Ser: 0.94 mg/dL (ref 0.61–1.24)
GFR, Estimated: >60 mL/min (ref >60)
Glucose, Bld: 119 mg/dL — ABNORMAL HIGH (ref 70–99)
Potassium: 3.6 mmol/L (ref 3.5–5.1)
Sodium: 138 mmol/L (ref 135–145)
Total Bilirubin: 0.4 mg/dL (ref 0.3–1.2)
Total Protein: 8 g/dL (ref 6.5–8.1)

## 2021-10-02 LAB — BLOOD GAS, VENOUS
Acid-Base Excess: 0.5 mmol/L (ref 0.0–2.0)
Bicarbonate: 24.2 mmol/L (ref 20.0–28.0)
O2 Saturation: 92.9 %
Patient temperature: 37
pCO2, Ven: 34 mmHg — ABNORMAL LOW (ref 44–60)
pH, Ven: 7.46 — ABNORMAL HIGH (ref 7.25–7.43)
pO2, Ven: 61 mmHg — ABNORMAL HIGH (ref 32–45)

## 2021-10-02 LAB — CBC WITH DIFFERENTIAL/PLATELET
Abs Immature Granulocytes: 0.13 10*3/uL — ABNORMAL HIGH (ref 0.00–0.07)
Basophils Absolute: 0 10*3/uL (ref 0.0–0.1)
Basophils Relative: 0 %
Eosinophils Absolute: 0 10*3/uL (ref 0.0–0.5)
Eosinophils Relative: 0 %
HCT: 35.5 % — ABNORMAL LOW (ref 39.0–52.0)
Hemoglobin: 10.4 g/dL — ABNORMAL LOW (ref 13.0–17.0)
Immature Granulocytes: 1 %
Lymphocytes Relative: 2 %
Lymphs Abs: 0.5 10*3/uL — ABNORMAL LOW (ref 0.7–4.0)
MCH: 19.4 pg — ABNORMAL LOW (ref 26.0–34.0)
MCHC: 29.3 g/dL — ABNORMAL LOW (ref 30.0–36.0)
MCV: 66.1 fL — ABNORMAL LOW (ref 80.0–100.0)
Monocytes Absolute: 1 10*3/uL (ref 0.1–1.0)
Monocytes Relative: 4 %
Neutro Abs: 22.4 10*3/uL — ABNORMAL HIGH (ref 1.7–7.7)
Neutrophils Relative %: 93 %
Platelets: 476 10*3/uL — ABNORMAL HIGH (ref 150–400)
RBC: 5.37 MIL/uL (ref 4.22–5.81)
RDW: 23.2 % — ABNORMAL HIGH (ref 11.5–15.5)
WBC: 24 10*3/uL — ABNORMAL HIGH (ref 4.0–10.5)
nRBC: 0 % (ref 0.0–0.2)

## 2021-10-02 LAB — URINALYSIS, ROUTINE W REFLEX MICROSCOPIC
Bilirubin Urine: NEGATIVE
Glucose, UA: NEGATIVE mg/dL
Ketones, ur: NEGATIVE mg/dL
Nitrite: NEGATIVE
Protein, ur: 30 mg/dL — AB
RBC / HPF: >50 RBC/hpf — ABNORMAL HIGH (ref 0–5)
Specific Gravity, Urine: 1.014 (ref 1.005–1.030)
WBC, UA: >50 WBC/hpf — ABNORMAL HIGH (ref 0–5)
pH: 8 (ref 5.0–8.0)

## 2021-10-02 LAB — ETHANOL: Alcohol, Ethyl (B): <10 mg/dL (ref <10)

## 2021-10-02 LAB — RAPID URINE DRUG SCREEN, HOSP PERFORMED
Amphetamines: NOT DETECTED
Barbiturates: NOT DETECTED
Benzodiazepines: NOT DETECTED
Cocaine: NOT DETECTED
Opiates: NOT DETECTED
Tetrahydrocannabinol: NOT DETECTED

## 2021-10-02 LAB — PROTIME-INR
INR: 1.1 (ref 0.8–1.2)
Prothrombin Time: 13.8 seconds (ref 11.4–15.2)

## 2021-10-02 LAB — LACTIC ACID, PLASMA
Lactic Acid, Venous: 3.2 mmol/L (ref 0.5–1.9)
Lactic Acid, Venous: 2.8 mmol/L (ref 0.5–1.9)

## 2021-10-02 LAB — MAGNESIUM: Magnesium: 1.2 mg/dL — ABNORMAL LOW (ref 1.7–2.4)

## 2021-10-02 LAB — PHOSPHORUS: Phosphorus: 1.8 mg/dL — ABNORMAL LOW (ref 2.5–4.6)

## 2021-10-02 LAB — CK: Total CK: 52 U/L (ref 49–397)

## 2021-10-02 LAB — RESP PANEL BY RT-PCR (FLU A&B, COVID) ARPGX2
Influenza A by PCR: NEGATIVE
Influenza B by PCR: NEGATIVE
SARS Coronavirus 2 by RT PCR: NEGATIVE

## 2021-10-02 LAB — CBG MONITORING, ED: Glucose-Capillary: 102 mg/dL — ABNORMAL HIGH (ref 70–99)

## 2021-10-02 LAB — PROCALCITONIN: Procalcitonin: 43.17 ng/mL

## 2021-10-02 LAB — APTT: aPTT: 29 seconds (ref 24–36)

## 2021-10-02 LAB — AMMONIA: Ammonia: 14 umol/L (ref 9–35)

## 2021-10-02 MED ORDER — SODIUM CHLORIDE 0.9 % IV SOLN
1.0000 g | Freq: Three times a day (TID) | INTRAVENOUS | Status: DC
  Administered 2021-10-02 – 2021-10-04 (×5): 1 g via INTRAVENOUS
  Filled 2021-10-02 (×7): qty 20

## 2021-10-02 MED ORDER — INSULIN ASPART 100 UNIT/ML IJ SOLN
0.0000 [IU] | INTRAMUSCULAR | Status: DC
  Administered 2021-10-03: 1 [IU] via SUBCUTANEOUS
  Filled 2021-10-02: qty 0.09

## 2021-10-02 MED ORDER — LACTATED RINGERS IV BOLUS (SEPSIS)
1000.0000 mL | Freq: Once | INTRAVENOUS | Status: AC
  Administered 2021-10-02: 1000 mL via INTRAVENOUS

## 2021-10-02 MED ORDER — LACTATED RINGERS IV SOLN: INTRAVENOUS | Status: DC

## 2021-10-02 MED ORDER — LACTATED RINGERS IV BOLUS (SEPSIS)
250.0000 mL | Freq: Once | INTRAVENOUS | Status: AC
  Administered 2021-10-02: 250 mL via INTRAVENOUS

## 2021-10-02 MED ORDER — ACETAMINOPHEN 650 MG RE SUPP
650.0000 mg | Freq: Once | RECTAL | Status: AC
  Administered 2021-10-02: 650 mg via RECTAL
  Filled 2021-10-02: qty 1

## 2021-10-02 MED ORDER — SODIUM CHLORIDE 0.9 % IV SOLN
2.0000 g | Freq: Once | INTRAVENOUS | Status: AC
  Administered 2021-10-02: 2 g via INTRAVENOUS
  Filled 2021-10-02: qty 12.5

## 2021-10-02 MED ORDER — METRONIDAZOLE 500 MG/100ML IV SOLN
500.0000 mg | Freq: Once | INTRAVENOUS | Status: AC
  Administered 2021-10-02: 500 mg via INTRAVENOUS
  Filled 2021-10-02: qty 100

## 2021-10-02 MED ORDER — VANCOMYCIN HCL IN DEXTROSE 1-5 GM/200ML-% IV SOLN
1000.0000 mg | Freq: Once | INTRAVENOUS | Status: AC
  Administered 2021-10-02: 1000 mg via INTRAVENOUS
  Filled 2021-10-02: qty 200

## 2021-10-02 MED ORDER — VANCOMYCIN HCL 1500 MG/300ML IV SOLN
1500.0000 mg | INTRAVENOUS | Status: DC
  Administered 2021-10-03: 1500 mg via INTRAVENOUS
  Filled 2021-10-02: qty 300

## 2021-10-02 MED ORDER — IBUPROFEN 200 MG PO TABS
400.0000 mg | ORAL_TABLET | Freq: Once | ORAL | Status: AC
  Administered 2021-10-02: 400 mg via ORAL
  Filled 2021-10-02: qty 2

## 2021-10-02 MED ORDER — SODIUM CHLORIDE 0.9 % IV SOLN: 2.0000 g | Freq: Three times a day (TID) | INTRAVENOUS | Status: DC

## 2021-10-02 MED ORDER — METRONIDAZOLE 500 MG/100ML IV SOLN: 500.0000 mg | Freq: Two times a day (BID) | INTRAVENOUS | Status: DC

## 2021-10-02 MED ORDER — VANCOMYCIN HCL 500 MG/100ML IV SOLN
500.0000 mg | INTRAVENOUS | Status: AC
  Administered 2021-10-02: 500 mg via INTRAVENOUS
  Filled 2021-10-02: qty 100

## 2021-10-02 NOTE — Assessment & Plan Note (Signed)
monitor for any sign of sundowning we will

## 2021-10-02 NOTE — Assessment & Plan Note (Signed)
Nutritional consult

## 2021-10-02 NOTE — Assessment & Plan Note (Signed)
-   Order Sensitive SSI  °  ° -  check TSH and HgA1C °  ° ° °

## 2021-10-02 NOTE — ED Provider Notes (Signed)
[facility] DEPT Provider Note   CSN: [id number] Arrival date & time: 10/02/21  1609     History  Chief Complaint  Patient presents with   Altered Mental Status    Jason [name] is a 76 y.o. male.  Patient is a 76 year old male with a past medical history of dementia ANO x1 at baseline and urethral cancer presenting to the emergency department with altered mental status.  Patient is here from his nursing home who states that he had a dental procedure this morning.  They state that he received 2 mg of Ativan throughout the procedure and when he returned he was altered.  He states that normally he can tell you his name and will be conversive and just not make sense, however he is not talking like his normal self here.   Altered Mental Status      Home Medications Prior to Admission medications   Medication Sig Start Date End Date Taking? Authorizing Provider  acetaminophen (TYLENOL) 650 MG suppository Place 1 suppository (650 mg total) rectally every 4 (four) hours as needed for fever or mild pain. 03/20/21   Rosezetta Schlatter, MD  antiseptic oral rinse (BIOTENE) LIQD Apply 15 mLs topically 3 (three) times daily. 03/20/21   Rosezetta Schlatter, MD  chlorhexidine gluconate, MEDLINE KIT, (PERIDEX) 0.12 % solution 15 mLs by Mouth Rinse route 2 (two) times daily. 03/20/21   Rosezetta Schlatter, MD  famotidine (PEPCID) 20 MG tablet Place 1 tablet (20 mg total) into feeding tube daily. 03/21/21   Rosezetta Schlatter, MD  glycopyrrolate (ROBINUL) 0.2 MG/ML injection Inject 1 mL (0.2 mg total) into the skin every 4 (four) hours as needed (excessive secretions). 03/20/21   Rosezetta Schlatter, MD  guaiFENesin (ROBITUSSIN) 100 MG/5ML liquid Place 10 mLs into feeding tube every 6 (six) hours. 03/20/21   Rosezetta Schlatter, MD  haloperidol (HALDOL) 2 MG/ML solution Place 1 mL (2 mg total) under the tongue every 4 (four) hours as needed for agitation (or delirium). 03/20/21   Rosezetta Schlatter, MD   hydrALAZINE (APRESOLINE) 20 MG/ML injection Inject 0.25 mLs (5 mg total) into the vein every 6 (six) hours as needed (SVBP > 165). 03/20/21   Rosezetta Schlatter, MD  ipratropium-albuterol (DUONEB) 0.5-2.5 (3) MG/3ML SOLN Take 3 mLs by nebulization every 4 (four) hours as needed. 03/20/21   Rosezetta Schlatter, MD  LORazepam (ATIVAN) 2 MG/ML concentrated solution Place 0.5 mLs (1 mg total) under the tongue every 4 (four) hours as needed for anxiety. 03/20/21   Rosezetta Schlatter, MD  Morphine Sulfate (MORPHINE, PF,) 2 MG/ML injection Inject 1 mL (2 mg total) into the vein every hour as needed (or dyspnea). 03/20/21   Rosezetta Schlatter, MD  Mouthwashes (MOUTH RINSE) LIQD solution 15 mLs by Mouth Rinse route 2 (two) times daily. 03/20/21   Rosezetta Schlatter, MD  Nystatin (GERHARDT'S BUTT CREAM) CREA Apply 1 application. topically 2 (two) times daily. 03/20/21   Rosezetta Schlatter, MD  polyvinyl alcohol (LIQUIFILM TEARS) 1.4 % ophthalmic solution Place 1 drop into both eyes 4 (four) times daily as needed for dry eyes. 03/20/21   Rosezetta Schlatter, MD  sertraline (ZOLOFT) 25 MG tablet Place 1 tablet (25 mg total) into feeding tube daily. 03/21/21   Rosezetta Schlatter, MD      Allergies    Patient has no known allergies.    Review of Systems   Review of Systems  Physical Exam Updated Vital Signs BP (!) 100/55   Pulse (!) 112   Temp (!) 100.4  F (38 C) (Rectal)   Resp (!) 28   Ht $R'5\' 8"'Ro$  (1.727 m)   Wt 74 kg   SpO2 97%   BMI 24.81 kg/m  Physical Exam Vitals and nursing note reviewed.  Constitutional:      Appearance: He is ill-appearing. He is not toxic-appearing or diaphoretic.     Comments: Drowsy but arousable to verbal stimulation  HENT:     Head: Normocephalic and atraumatic.     Nose: Nose normal.     Mouth/Throat:     Mouth: Mucous membranes are moist.     Comments: Intraoral secretions Eyes:     Extraocular Movements: Extraocular movements intact.     Conjunctiva/sclera: Conjunctivae normal.   Cardiovascular:     Rate and Rhythm: Regular rhythm. Tachycardia present.     Pulses: Normal pulses.     Heart sounds: Normal heart sounds.  Pulmonary:     Effort: No respiratory distress.     Comments: Tachypneic but otherwise no increased work of breathing with no retractions or extra respiratory muscle use Right-sided crackles Left-sided lung sounds are clear Abdominal:     General: Abdomen is flat.     Palpations: Abdomen is soft.     Tenderness: There is no abdominal tenderness.  Musculoskeletal:        General: Normal range of motion.     Cervical back: Normal range of motion and neck supple.     Right lower leg: No edema.     Left lower leg: No edema.  Skin:    General: Skin is warm and dry.  Neurological:     Comments: Awake and alert, says hello but otherwise not answering any questions Moving all 4 extremities spontaneously     ED Results / Procedures / Treatments   Labs (all labs ordered are listed, but only abnormal results are displayed) Labs Reviewed  LACTIC ACID, PLASMA - Abnormal; Notable for the following components:      Result Value   Lactic Acid, Venous 3.2 (*)    All other components within normal limits  LACTIC ACID, PLASMA - Abnormal; Notable for the following components:   Lactic Acid, Venous 2.8 (*)    All other components within normal limits  COMPREHENSIVE METABOLIC PANEL - Abnormal; Notable for the following components:   Glucose, Bld 119 (*)    Calcium 8.6 (*)    Albumin 2.8 (*)    All other components within normal limits  CBC WITH DIFFERENTIAL/PLATELET - Abnormal; Notable for the following components:   WBC 24.0 (*)    Hemoglobin 10.4 (*)    HCT 35.5 (*)    MCV 66.1 (*)    MCH 19.4 (*)    MCHC 29.3 (*)    RDW 23.2 (*)    Platelets 476 (*)    Neutro Abs 22.4 (*)    Lymphs Abs 0.5 (*)    Abs Immature Granulocytes 0.13 (*)    All other components within normal limits  URINALYSIS, ROUTINE W REFLEX MICROSCOPIC - Abnormal; Notable for  the following components:   APPearance CLOUDY (*)    Hgb urine dipstick SMALL (*)    Protein, ur 30 (*)    Leukocytes,Ua MODERATE (*)    RBC / HPF >50 (*)    WBC, UA >50 (*)    Bacteria, UA FEW (*)    All other components within normal limits  BLOOD GAS, VENOUS - Abnormal; Notable for the following components:   pH, Ven 7.46 (*)  pCO2, Ven 34 (*)    pO2, Ven 61 (*)    All other components within normal limits  MAGNESIUM - Abnormal; Notable for the following components:   Magnesium 1.2 (*)    All other components within normal limits  PHOSPHORUS - Abnormal; Notable for the following components:   Phosphorus 1.8 (*)    All other components within normal limits  RESP PANEL BY RT-PCR (FLU A&B, COVID) ARPGX2  CULTURE, BLOOD (ROUTINE X 2)  CULTURE, BLOOD (ROUTINE X 2)  URINE CULTURE  EXPECTORATED SPUTUM ASSESSMENT W GRAM STAIN, RFLX TO RESP C  PROTIME-INR  APTT  PROCALCITONIN  CK  ETHANOL  RAPID URINE DRUG SCREEN, HOSP PERFORMED  HIV ANTIBODY (ROUTINE TESTING W REFLEX)  LACTIC ACID, PLASMA  LACTIC ACID, PLASMA  LEGIONELLA PNEUMOPHILA SEROGP 1 UR AG  STREP PNEUMONIAE URINARY ANTIGEN  URINALYSIS, COMPLETE (UACMP) WITH MICROSCOPIC  AMMONIA  CREATININE, URINE, RANDOM  OSMOLALITY  OSMOLALITY, URINE  PREALBUMIN  SODIUM, URINE, RANDOM  TSH  HEMOGLOBIN A1C    EKG EKG Interpretation  Date/Time:  Monday October 02 2021 16:28:03 EDT Ventricular Rate:  140 PR Interval:  118 QRS Duration: 118 QT Interval:  292 QTC Calculation: 446 R Axis:   89 Text Interpretation: Sinus tachycardia Right bundle branch block Tachycardic otherwise no significant change from prior EKG Confirmed by Leanord Asal ([id number]) on 10/02/2021 5:45:50 PM  Radiology CT HEAD WO CONTRAST (5MM)  Result Date: 10/02/2021 CLINICAL DATA:  Altered mental status. EXAM: CT HEAD WITHOUT CONTRAST TECHNIQUE: Contiguous axial images were obtained from the base of the skull through the vertex without intravenous  contrast. RADIATION DOSE REDUCTION: This exam was performed according to the departmental dose-optimization program which includes automated exposure control, adjustment of the mA and/or kV according to patient size and/or use of iterative reconstruction technique. COMPARISON:  December 13, 2015. 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 No mass effect or midline shift is noted. Ventricular size is within normal limits. There is no evidence of mass lesion, hemorrhage or acute infarction. Vascular: No hyperdense vessel or unexpected calcification. Skull: Normal. Negative for fracture or focal lesion. Sinuses/Orbits: No acute finding. Other: None. IMPRESSION: No acute intracranial abnormality seen. Electronically Signed   By: Marijo Conception M.D.   On: 10/02/2021 20:23   DG Chest Port 1 View  Result Date: 10/02/2021 CLINICAL DATA:  Fever. EXAM: PORTABLE CHEST 1 VIEW COMPARISON:  March 16, 2021. FINDINGS: The heart size and mediastinal contours are within normal limits. Grossly stable bilateral upper lobe reticular opacities are noted most consistent with scarring. Increased right basilar atelectasis or infiltrate is noted with small right pleural effusion. The visualized skeletal structures are unremarkable. IMPRESSION: Right basilar opacity is noted concerning for right basilar atelectasis or infiltrate with associated small pleural effusion. Electronically Signed   By: Marijo Conception M.D.   On: 10/02/2021 17:55    Procedures Procedures    Medications Ordered in ED Medications  lactated ringers infusion (has no administration in time range)  vancomycin (VANCOREADY) IVPB 500 mg/100 mL (500 mg Intravenous New Bag/Given 10/02/21 2211)  vancomycin (VANCOREADY) IVPB 1500 mg/300 mL (has no administration in time range)  insulin aspart (novoLOG) injection 0-9 Units (has no administration in time range)  meropenem (MERREM) 1 g in sodium chloride 0.9 % 100 mL IVPB (1 g  Intravenous New Bag/Given 10/02/21 2217)  lactated ringers bolus 1,000 mL (0 mLs Intravenous Stopped 10/02/21 1812)    And  lactated ringers bolus 1,000 mL (  0 mLs Intravenous Stopped 10/02/21 1812)    And  lactated ringers bolus 250 mL (0 mLs Intravenous Stopped 10/02/21 2143)  ceFEPIme (MAXIPIME) 2 g in sodium chloride 0.9 % 100 mL IVPB (0 g Intravenous Stopped 10/02/21 1726)  metroNIDAZOLE (FLAGYL) IVPB 500 mg (0 mg Intravenous Stopped 10/02/21 1746)  vancomycin (VANCOCIN) IVPB 1000 mg/200 mL premix (0 mg Intravenous Stopped 10/02/21 2001)  acetaminophen (TYLENOL) suppository 650 mg (650 mg Rectal Given 10/02/21 1700)  ibuprofen (ADVIL) tablet 400 mg (400 mg Oral Given 10/02/21 1849)    ED Course/ Medical Decision Making/ A&P                           Medical Decision Making This patient presents to the ED with chief complaint(s) of altered mental status with pertinent past medical history of [place] which further complicates the presenting complaint. The complaint involves an extensive differential diagnosis and also carries with it a high risk of complications and morbidity.    The differential diagnosis includes sepsis, electrolyte abnormality, medication side effect, dehydration, ACS, arrhythmia  Additional history obtained: Additional history obtained from EMS  and nursing home/care facility Records reviewed previous admission documents  ED Course and Reassessment: On patient's arrival, he is awake altered and found to be hypothermic, tachycardic and tachypneic.  Initial oxygen saturations were low between 89 to 92% on room air and he was placed on 2 L nasal cannula.  He does have crackles on the right side concerning for possible pneumonia or aspiration.  He will be started with a sepsis work-up, started on IV fluids, given Tylenol for his fever and broad-spectrum antibiotics.  Independent labs interpretation:  The following labs were independently interpreted: Leukocytosis, mildly  elevated lactate  Independent visualization of imaging: - I independently visualized the following imaging with scope of interpretation limited to determining acute life threatening conditions related to emergency care: Chest x-ray, which revealed right lower lobe infiltrate and effusion  Consultation: - Consulted or discussed management/test interpretation w/ external professional: Hospitalist  Consideration for admission or further workup: Patient requires admission for further management of his sepsis secondary to pneumonia Social Determinants of health: N/A    Amount and/or Complexity of Data Reviewed Labs: ordered. Radiology: ordered. ECG/medicine tests: ordered.  Risk OTC drugs. Prescription drug management. Decision regarding hospitalization.          Final Clinical Impression(s) / ED Diagnoses Final diagnoses:  Sepsis, due to unspecified organism, unspecified whether acute organ dysfunction present [facility])  Pneumonia of right lower lobe due to infectious organism    Rx / DC Orders ED Discharge Orders     None         Kemper Durie, DO 10/02/21 2324

## 2021-10-02 NOTE — Progress Notes (Signed)
A consult was received from an ED physician for vancomycin and cefepime per pharmacy dosing (for an indication other than meningitis). The patient's profile has been reviewed for ht/wt/allergies/indication/available labs. A one time order has been placed for the above antibiotics.  Further antibiotics/pharmacy consults should be ordered by admitting physician if indicated.                       Reuel Boom, PharmD, BCPS [phone] 10/02/2021, 4:47 PM

## 2021-10-02 NOTE — ED Triage Notes (Signed)
BIBA from [facility] for AMS.  A&O x1 to self @ baseline.  Pt had dentist appt this am that he received '2mg'$  ativan at and returned lethargic per ems  Hx dementia Ems vitals: 110/68 93%RA 99.47F 140 HR 30RR 124 CBG

## 2021-10-02 NOTE — ED Notes (Signed)
MD aware of temp 

## 2021-10-02 NOTE — Assessment & Plan Note (Signed)
currently on cefepime/ falgyl Await result of urine culture will change to meropenem given hx of ESBL in the past

## 2021-10-02 NOTE — Assessment & Plan Note (Addendum)
  Likely due to:  Pneumonia,  Provide O2 therapy and titrate as needed  Continuous pulse ox  check Pulse ox with ambulation prior to discharge    flutter valve ordered

## 2021-10-02 NOTE — Assessment & Plan Note (Signed)
Continue broad spectrum coverage with cefepime and metronidazole also added vancomycin. Await results of sputum cultures Blood cultures COVID and influenza

## 2021-10-02 NOTE — Progress Notes (Signed)
Pharmacy Antibiotic Note  Jason [name] is a 76 y.o. male admitted on 10/02/2021 with sepsis.  Pharmacy has been consulted for Vanco, Cefepime dosing.  Active Problem(s): BIBA from [facility] for [facility].  - Febrile, Tachycardic, Tachypnea, Leukocytosis, LA elevated  PMH: Dementia  ID: Sepsis, -  Tmax 102.7. WBC 24, Scr 0.94 LA 3.2>2.8,   Vanco 10/2>> Cefepime 10/2>>  Plan: Cefepime 2g IV q8hr Vanco 1g + '500mg'$  IV in ED Vancomycin 1500 mg IV Q 24 hrs. Goal AUC 400-550. Expected AUC: 499 SCr used: 0.94    Height: '5\' 8"'$  (172.7 cm) Weight: 74 kg (163 lb 2.3 oz) IBW/kg (Calculated) : 68.4  Temp (24hrs), Avg:103.6 F (39.8 C), Min:102.7 F (39.3 C), Max:104.4 F (40.2 C)  Recent Labs  Lab 10/02/21 1635 10/02/21 1835  WBC 24.0*  --   CREATININE 0.94  --   LATICACIDVEN 3.2* 2.8*    Estimated Creatinine Clearance: 62.7 mL/min (by C-G formula based on SCr of 0.94 mg/dL).    No Known Allergies  Jason [name] S. Alford Highland, PharmD, BCPS Clinical Staff Pharmacist [email or web address]   Wayland Salinas 10/02/2021 7:58 PM

## 2021-10-02 NOTE — Assessment & Plan Note (Signed)
-   most likely multifactorial secondary to combination of  Infection  mild dehydration secondary to decreased by mouth intake,   polypharmacy   - Will rehydrate*  - treat underlining infection   - Hold contributing medications   - if no improvement may need further imaging to evaluate for CNS pathology pathology such as MRI of the brain   - neurological exam appears to be nonfocal but patient unable to cooperate fully   - VBG orderd   - ammonia ordered

## 2021-10-02 NOTE — Subjective & Objective (Signed)
Patient now resides at [facility] he had a dental procedure today during which she received 2 mg of Ativan by the time he came back to [facility] facility he was very lethargic.  EMS was called he has a history of dementia and alcohol abuse in the past at baseline.  On arrival blood pressure 110/68 initially satting well 93% on room air heart rate in 140s

## 2021-10-02 NOTE — Sepsis Progress Note (Signed)
Code sepsis protocol being monitored by eLink. 

## 2021-10-02 NOTE — Assessment & Plan Note (Addendum)
-  SIRS criteria met with  elevated white blood cell count,       Component Value Date/Time   WBC 24.0 (H) 10/02/2021 1635   LYMPHSABS PENDING 10/02/2021 1635    tachycardia   fever   RR >20 Today's Vitals   10/02/21 1815 10/02/21 1845 10/02/21 1900 10/02/21 1915  BP: (!) 110/54 116/78 (!) 111/57 (!) 103/53  Pulse: (!) 117 (!) 119 (!) 118 (!) 118  Resp: 19 20 (!) 30 (!) 32  Temp: (!) 102.7 F (39.3 C)     TempSrc: Rectal     SpO2: 98% 100% 96% 97%  Weight:      Height:       Body mass index is 24.81 kg/m.  This patient meets SIRS Criteria and may be septic.   The recent clinical data is shown below. Vitals:   10/02/21 1815 10/02/21 1845 10/02/21 1900 10/02/21 1915  BP: (!) 110/54 116/78 (!) 111/57 (!) 103/53  Pulse: (!) 117 (!) 119 (!) 118 (!) 118  Resp: 19 20 (!) 30 (!) 32  Temp: (!) 102.7 F (39.3 C)     TempSrc: Rectal     SpO2: 98% 100% 96% 97%  Weight:      Height:        -Most likely source being:  pulmonary,    Patient meeting criteria for Severe sepsis with    evidence of end organ damage/organ dysfunction such as    elevated lactic acid >2     Component Value Date/Time   LATICACIDVEN 3.2 (HH) 10/02/2021 1287    acute metabolic encephalopathy    Acute hypoxia requiring new supplemental oxygen, SpO2: 97 % O2 Flow Rate (L/min): 2 L/min      - Obtain serial lactic acid and procalcitonin level.  - Initiated IV antibiotics in ER: Antibiotics Given (last 72 hours)    Date/Time Action Medication Dose Rate   10/02/21 1700 New Bag/Given   ceFEPIme (MAXIPIME) 2 g in sodium chloride 0.9 % 100 mL IVPB 2 g 200 mL/hr   10/02/21 1726 New Bag/Given   metroNIDAZOLE (FLAGYL) IVPB 500 mg 500 mg 100 mL/hr   10/02/21 1746 New Bag/Given   vancomycin (VANCOCIN) IVPB 1000 mg/200 mL premix 1,000 mg 200 mL/hr      Will continue  On meropenem vancomycin   - await results of blood and urine culture  - Rehydrate aggressively  Intravenous fluids were administered, 30cc/kg  fluid   7:37 PM

## 2021-10-02 NOTE — H&P (Addendum)
Jason [name] TWS:[id number] DOB: 10-21-1945 DOA: 10/02/2021     PCP: Jason Adie, MD   Currently in the care of [facility]    Urology Dr.  Louis [name]  Patient arrived to ER on 10/02/21 at 1609 Referred by Attending Jason Baker, MD   Patient coming from:     From facility [facility]  Chief Complaint:   Chief Complaint  Patient presents with   Altered Mental Status    HPI: Jason [name] is a 76 y.o. male with medical history significant of dementia history of alcohol abuse transient A-fib not on anticoagulation History of Klebsiella aspiration pneumonia in the past history of bladder cancer status postresection, Dm2 History of incarcerated bowel inguinal hernia status postrepair Presented with  confusion Patient now resides at [facility] he had a dental procedure today during which she received 2 mg of Ativan by the time he came back to [facility] facility he was very lethargic.  EMS was called he has a history of dementia and alcohol abuse in the past at baseline.  On arrival blood pressure 110/68 initially satting well 93% on room air heart rate in 140s   NEGATIVE   Lab Results  Component Value Date   [place] NEGATIVE 10/02/2021   [place] NEGATIVE 03/02/2021     Regarding pertinent Chronic problems:      chronic CHF diastolic/  - last echo 0017 Grade II diastolic dysfunction   DM 2 -  Lab Results  Component Value Date   HGBA1C 7.6 (H) 03/02/2021    diet controlled      COPD - not  followed by pulmonology   not  on baseline oxygen     Dementia - on zoloft, haldol prn    Chronic anemia - baseline hg Hemoglobin & Hematocrit  Recent Labs    03/15/21 0659 03/16/21 0224 10/02/21 1635  HGB 10.6* 10.4* 10.4*     While in ER:    Found to have pneumonia suspicious for again recurrent aspiration pneumonia.  Severe sepsis started on broad-spectrum antibiotics cefepime Vanco and metronidazole   Ordered  CT HEAD   NON  acute  CXR - Right basilar opacity is noted concerning for right basilar atelectasis or infiltrate with associated small pleural effusion.    Following Medications were ordered in ER: Medications  lactated ringers infusion (has no administration in time range)  metroNIDAZOLE (FLAGYL) IVPB 500 mg (has no administration in time range)  lactated ringers bolus 1,000 mL (0 mLs Intravenous Stopped 10/02/21 1812)    And  lactated ringers bolus 1,000 mL (0 mLs Intravenous Stopped 10/02/21 1812)    And  lactated ringers bolus 250 mL (250 mLs Intravenous New Bag/Given 10/02/21 1811)  ceFEPIme (MAXIPIME) 2 g in sodium chloride 0.9 % 100 mL IVPB (0 g Intravenous Stopped 10/02/21 1726)  metroNIDAZOLE (FLAGYL) IVPB 500 mg (0 mg Intravenous Stopped 10/02/21 1746)  vancomycin (VANCOCIN) IVPB 1000 mg/200 mL premix (1,000 mg Intravenous New Bag/Given 10/02/21 1746)  acetaminophen (TYLENOL) suppository 650 mg (650 mg Rectal Given 10/02/21 1700)  ibuprofen (ADVIL) tablet 400 mg (400 mg Oral Given 10/02/21 1849)       ED Triage Vitals  Enc Vitals Group     BP 10/02/21 1629 114/61     Pulse Rate 10/02/21 1629 (!) 139     Resp 10/02/21 1629 (!) 34     Temp 10/02/21 1629 (!) 104.4 F (40.2 C)     Temp Source 10/02/21 1629 Rectal  SpO2 10/02/21 1629 92 %     Weight 10/02/21 1624 163 lb 2.3 oz (74 kg)     Height 10/02/21 1624 $RemoveBefor'5\' 8"'omRuqUOWdiNI$  (1.727 m)     Head Circumference --      Peak Flow --      Pain Score --      Pain Loc --      Pain Edu? --      Excl. in [place]? --   TMAX(24)@     _________________________________________ Significant initial  Findings: Abnormal Labs Reviewed  LACTIC ACID, PLASMA - Abnormal; Notable for the following components:      Result Value   Lactic Acid, Venous 3.2 (*)    All other components within normal limits  COMPREHENSIVE METABOLIC PANEL - Abnormal; Notable for the following components:   Glucose, Bld 119 (*)    Calcium 8.6 (*)    Albumin 2.8 (*)    All other components  within normal limits  CBC WITH DIFFERENTIAL/PLATELET - Abnormal; Notable for the following components:   WBC 24.0 (*)    Hemoglobin 10.4 (*)    HCT 35.5 (*)    MCV 66.1 (*)    MCH 19.4 (*)    MCHC 29.3 (*)    RDW 23.2 (*)    Platelets 476 (*)    All other components within normal limits    ECG: Ordered Personally reviewed and interpreted by me showing: HR : 140 Rhythm:Sinus tachycardia Right bundle branch block Tachycardic otherwise no significant change from prior EKG QTC 446   ____________________ This patient meets SIRS Criteria and may be septic.    The recent clinical data is shown below. Vitals:   10/02/21 1815 10/02/21 1845 10/02/21 1900 10/02/21 1915  BP: (!) 110/54 116/78 (!) 111/57 (!) 103/53  Pulse: (!) 117 (!) 119 (!) 118 (!) 118  Resp: 19 20 (!) 30 (!) 32  Temp: (!) 102.7 F (39.3 C)     TempSrc: Rectal     SpO2: 98% 100% 96% 97%  Weight:      Height:          WBC     Component Value Date/Time   WBC 24.0 (H) 10/02/2021 1635   LYMPHSABS PENDING 10/02/2021 1635   MONOABS PENDING 10/02/2021 1635   EOSABS PENDING 10/02/2021 1635   BASOSABS PENDING 10/02/2021 1635        Lactic Acid, Venous    Component Value Date/Time   LATICACIDVEN 3.2 (HH) 10/02/2021 1635     Procalcitonin   Ordered Lactic Acid, Venous    Component Value Date/Time   LATICACIDVEN 2.8 (HH) 10/02/2021 1835     UA   evidence of UTI      Urine analysis:    Component Value Date/Time   COLORURINE YELLOW 10/02/2021 1822   APPEARANCEUR CLOUDY (A) 10/02/2021 1822   LABSPEC 1.014 10/02/2021 1822   PHURINE 8.0 10/02/2021 1822   GLUCOSEU NEGATIVE 10/02/2021 1822   HGBUR SMALL (A) 10/02/2021 1822   BILIRUBINUR NEGATIVE 10/02/2021 1822   KETONESUR NEGATIVE 10/02/2021 1822   PROTEINUR 30 (A) 10/02/2021 1822   UROBILINOGEN 0.2 03/07/2013 0222   NITRITE NEGATIVE 10/02/2021 1822   LEUKOCYTESUR MODERATE (A) 10/02/2021 1822    Results for orders placed or performed during the  hospital encounter of 10/02/21  Resp Panel by RT-PCR (Flu A&B, Covid) Anterior Nasal Swab     Status: None   Collection Time: 10/02/21  4:35 PM   Specimen: Anterior Nasal Swab  Result Value Ref Range Status  SARS Coronavirus 2 by RT PCR NEGATIVE NEGATIVE Final         Influenza A by PCR NEGATIVE NEGATIVE Final   Influenza B by PCR NEGATIVE NEGATIVE Final           _______________________________________________ Hospitalist was called for admission for   Sepsis, due to unspecified organism, unspecified whether acute organ dysfunction present ([place])    Pneumonia of right lower lobe due to infectious organism, UTI    The following Work up has been ordered so far:  Orders Placed This Encounter  Procedures   Resp Panel by RT-PCR (Flu A&B, Covid) Anterior Nasal Swab   Blood Culture (routine x 2)   Urine Culture   Expectorated Sputum Assessment w Gram Stain, Rflx to Resp Cult   DG Chest Port 1 View   Lactic acid, plasma   Comprehensive metabolic panel   CBC with Differential   Protime-INR   APTT   Urinalysis, Routine w reflex microscopic   HIV Antibody (routine testing w rflx)   Lactic acid, plasma   Legionella Pneumophila Serogp 1 Ur Ag   Strep pneumoniae urinary antigen   Procalcitonin   Urinalysis, Complete w Microscopic   Blood gas, venous   Ammonia   CK   Creatinine, urine, random   Magnesium   Osmolality   Osmolality, urine   Phosphorus   Prealbumin   Sodium, urine, random   TSH   Ethanol   Rapid urine drug screen (hospital performed)   Diet NPO time specified   Cardiac monitoring   Document height and weight   Assess and Document Glasgow Coma Scale   Document vital signs within 1-hour of fluid bolus completion. Notify provider of abnormal vital signs despite fluid resuscitation.   DO NOT delay antibiotics if unable to obtain blood culture.   Refer to Sidebar Report: Sepsis Sidebar ED/IP   Notify provider for difficulties obtaining IV access.   Insert  peripheral IV x 2   Initiate Carrier Fluid Protocol   Cardiac monitoring   Apply Sepsis Care Plan   If lactate (lactic acid) >2, verify repeat lactic acid order has been placed to be drawn   Document vital signs within 1-hour of fluid bolus completion and notify provider of bolus completion   Vital signs   Vital signs   Assess and Document Glasgow Coma Scale   Cardiac Monitoring - Continuous Indefinite   Cardiac Monitoring - Continuous Indefinite   Code Sepsis activation.  This occurs automatically when order is signed and prioritizes pharmacy, lab, and radiology services for STAT collections and interventions.  If [facility] downtime, call [facility] [phone]) to activate Code Sepsis.   Consult to hospitalist   Pharmacy Consult   ceFEPime (MAXIPIME) per pharmacy consult            vancomycin per pharmacy consult   Pulse oximetry, continuous   ED EKG 12-Lead   Admit to Inpatient (patient's expected length of stay will be greater than 2 midnights or inpatient only procedure)     OTHER Significant initial  Findings:  labs showing:    Recent Labs  Lab 10/02/21 1635  NA 138  K 3.6  CO2 25  GLUCOSE 119*  BUN 23  CREATININE 0.94  CALCIUM 8.6*    Cr  * stable,  Up from baseline see below Lab Results  Component Value Date   CREATININE 0.94 10/02/2021   CREATININE 1.34 (H) 03/17/2021   CREATININE 1.18 03/16/2021    Recent Labs  Lab 10/02/21 1635  AST 26  ALT 16  ALKPHOS 114  BILITOT 0.4  PROT 8.0  ALBUMIN 2.8*   Lab Results  Component Value Date   CALCIUM 8.6 (L) 10/02/2021   PHOS 4.2 03/16/2021          Plt: Lab Results  Component Value Date   PLT 476 (H) 10/02/2021       COVID-19 Labs  No results for input(s): "DDIMER", "FERRITIN", "LDH", "CRP" in the last 72 hours.  Lab Results  Component Value Date   SARSCOV2NAA NEGATIVE 10/02/2021   SARSCOV2NAA NEGATIVE 03/02/2021     Venous  Blood Gas result:  pH   7.46 High  Acid-Base Excess 0.5 mmol/L   pCO2, Ven 34 Low  mmHg O2 Saturation 92.9 %  pO2, Ven 61 High  mmHg Patient temperature 37.0   Bicarbonate 24.2 mmol/L      ABG    Component Value Date/Time   PHART 7.457 (H) 03/07/2021 1423   PCO2ART 48.0 03/07/2021 1423   PO2ART 51 (L) 03/07/2021 1423   HCO3 33.9 (H) 03/07/2021 1423   TCO2 31 03/07/2021 1433   ACIDBASEDEF 3.0 (H) 03/03/2021 0528   O2SAT 87 03/07/2021 1423       Recent Labs  Lab 10/02/21 1635  WBC 24.0*  NEUTROABS PENDING  HGB 10.4*  HCT 35.5*  MCV 66.1*  PLT 476*    HG/HCT  stable,      Component Value Date/Time   HGB 10.4 (L) 10/02/2021 1635   HCT 35.5 (L) 10/02/2021 1635   MCV 66.1 (L) 10/02/2021 1635       DM  labs:  HbA1C: Recent Labs    03/02/21 0512  HGBA1C 7.6*    CBG (last 3)  No results for input(s): "GLUCAP" in the last 72 hours.        Cultures:    Component Value Date/Time   SDES TRACHEAL ASPIRATE 03/06/2021 1126   SPECREQUEST NONE 03/06/2021 1126   CULT RARE KLEBSIELLA PNEUMONIAE 03/06/2021 1126   REPTSTATUS 03/08/2021 FINAL 03/06/2021 1126     Radiological Exams on Admission: CT HEAD WO CONTRAST ( )  Result Date: 10/02/2021 CLINICAL DATA:  Altered mental status. EXAM: CT HEAD WITHOUT CONTRAST TECHNIQUE: Contiguous axial images were obtained from the base of the skull through the vertex without intravenous contrast. RADIATION DOSE REDUCTION: This exam was performed according to the departmental dose-optimization program which includes automated exposure control, adjustment of the mA and/or kV according to patient size and/or use of iterative reconstruction technique. COMPARISON:  December 13, 2015. 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 No mass effect or midline shift is noted. Ventricular size is within normal limits. There is no evidence of mass lesion, hemorrhage or acute infarction. Vascular: No hyperdense vessel or unexpected calcification. Skull: Normal. Negative for fracture or  focal lesion. Sinuses/Orbits: No acute finding. Other: None. IMPRESSION: No acute intracranial abnormality seen. Electronically Signed   By: Jason [name] M.D.   On: 10/02/2021 20:23   DG Chest Port 1 View  Result Date: 10/02/2021 CLINICAL DATA:  Fever. EXAM: PORTABLE CHEST 1 VIEW COMPARISON:  March 16, 2021. FINDINGS: The heart size and mediastinal contours are within normal limits. Grossly stable bilateral upper lobe reticular opacities are noted most consistent with scarring. Increased right basilar atelectasis or infiltrate is noted with small right pleural effusion. The visualized skeletal structures are unremarkable. IMPRESSION: Right basilar opacity is noted concerning for right basilar atelectasis or infiltrate with associated small pleural effusion. Electronically Signed  By: Jason [name] M.D.   On: 10/02/2021 17:55   _______________________________________________________________________________________________________ Latest  Blood pressure (!) 103/53, pulse (!) 118, temperature (!) 102.7 F (39.3 C), temperature source Rectal, resp. rate (!) 32, height $RemoveBe'5\' 8"'AfsbrmfuD$  (1.727 m), weight 74 kg, SpO2 97 %.   Vitals  labs and radiology finding personally reviewed  Review of Systems:    Pertinent positives include:  chills, fatigue  Constitutional:  No weight loss, night sweats, Fevers, , weight loss  HEENT:  No headaches, Difficulty swallowing,Tooth/dental problems,Sore throat,  No sneezing, itching, ear ache, nasal congestion, post nasal drip,  Cardio-vascular:  No chest pain, Orthopnea, PND, anasarca, dizziness, palpitations.no Bilateral lower extremity swelling  GI:  No heartburn, indigestion, abdominal pain, nausea, vomiting, diarrhea, change in bowel habits, loss of appetite, melena, blood in stool, hematemesis Resp:  no shortness of breath at rest. No dyspnea on exertion, No excess mucus, no productive cough, No non-productive cough, No coughing up of blood.No change in color of  mucus.No wheezing. Skin:  no rash or lesions. No jaundice GU:  no dysuria, change in color of urine, no urgency or frequency. No straining to urinate.  No flank pain.  Musculoskeletal:  No joint pain or no joint swelling. No decreased range of motion. No back pain.  Psych:  No change in mood or affect. No depression or anxiety. No memory loss.  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      Past Surgical History:  Procedure Laterality Date   BALLOON DILATION N/A 04/07/2014   Procedure: BALLOON DILATION;  Surgeon: Jason Hughs, MD;  Location: [facility];  Service: Urology;  Laterality: N/A;   CYSTOSCOPY W/ RETROGRADES Bilateral 04/07/2014   Procedure: BILATERAL RETROGRADE PYELOGRAM;  Surgeon: Jason Hughs, MD;  Location: [facility];  Service: Urology;  Laterality: Bilateral;   CYSTOSCOPY/RETROGRADE/URETEROSCOPY Bilateral 02/13/2013   Procedure: BILATERAL RETROGRADE;  Surgeon: Jason Hughs, MD;  Location: [facility];  Service: Urology;  Laterality: Bilateral;   INGUINAL HERNIA REPAIR Right 01/30/2018   Procedure: OPEN RIGHT INGUINAL HERNIA REPAIR WITH MESH;  Surgeon: Jason Luna, MD;  Location: [place];  Service: General;   Laterality: Right;   INGUINAL HERNIA REPAIR Left 03/07/2021   Procedure: HERNIA REPAIR INGUINAL WITH MESH;  Surgeon: Jason Bookbinder, MD;  Location: [place];  Service: General;  Laterality: Left;   INSERTION OF MESH Left 03/07/2021   Procedure: INSERTION OF MESH;  Surgeon: Jason Bookbinder, MD;  Location: [place];  Service: General;  Laterality: Left;   LAPAROTOMY N/A 02/18/2013   Procedure: EXPLORATORY LAPAROTOMY drainage of retroperitoneal abscess, drainage of Pre-Peritoneal abscess and Exploration of bladder perforation;  Surgeon: Jason Burn. Georgette Dover, MD;  Location: [place];  Service: General;  Laterality: N/A;   TRANSURETHRAL RESECTION OF BLADDER TUMOR WITH  GYRUS (TURBT-GYRUS) N/A 02/13/2013   Procedure: TRANSURETHRAL RESECTION OF BLADDER TUMOR WITH GYRUS (TURBT-GYRUS), bladder biopsies;  Surgeon: Jason Hughs, MD;  Location[facility];  Service: Urology;  Laterality: N/A;    Social History:  Ambulatory  bed bound     reports that he has been smoking cigarettes. He has been smoking an average of 1 pack per day. He has never used smokeless tobacco. He reports current alcohol use. He reports that he does not use drugs.     Family History:   History reviewed. No pertinent family history. ______________________________________________________________________________________________ Allergies: No Known Allergies   Prior to Admission medications   Medication Sig Start Date End Date Taking? Authorizing Provider  acetaminophen (TYLENOL) 650 MG suppository Place 1 suppository (650 mg total) rectally every 4 (four) hours as needed for fever or mild pain. 03/20/21   Jason Schlatter, MD  antiseptic oral rinse (BIOTENE) LIQD Apply 15 mLs topically 3 (three) times daily. 03/20/21   Jason Schlatter, MD  chlorhexidine gluconate, MEDLINE KIT, (PERIDEX) 0.12 % solution 15 mLs by Mouth Rinse route 2 (two) times daily. 03/20/21   Jason Schlatter, MD  famotidine (PEPCID) 20 MG tablet Place 1 tablet (20 mg  total) into feeding tube daily. 03/21/21   Jason Schlatter, MD  glycopyrrolate (ROBINUL) 0.2 MG/ML injection Inject 1 mL (0.2 mg total) into the skin every 4 (four) hours as needed (excessive secretions). 03/20/21   Jason Schlatter, MD  guaiFENesin (ROBITUSSIN) 100 MG/5ML liquid Place 10 mLs into feeding tube every 6 (six) hours. 03/20/21   Jason Schlatter, MD  haloperidol (HALDOL) 2 MG/ML solution Place 1 mL (2 mg total) under the tongue every 4 (four) hours as needed for agitation (or delirium). 03/20/21   Jason Schlatter, MD  hydrALAZINE (APRESOLINE) 20 MG/ML injection Inject 0.25 mLs (5 mg total) into the vein every 6 (six) hours as needed (SVBP > 165). 03/20/21   Jason Schlatter, MD  ipratropium-albuterol (DUONEB) 0.5-2.5 (3) MG/3ML SOLN Take 3 mLs by nebulization every 4 (four) hours as needed. 03/20/21   Jason Schlatter, MD  LORazepam (ATIVAN) 2 MG/ML concentrated solution Place 0.5 mLs (1 mg total) under the tongue every 4 (four) hours as needed for anxiety. 03/20/21   Jason Schlatter, MD  Morphine Sulfate (MORPHINE, PF,) 2 MG/ML injection Inject 1 mL (2 mg total) into the vein every hour as needed (or dyspnea). 03/20/21   Jason Schlatter, MD  Mouthwashes (MOUTH RINSE) LIQD solution 15 mLs by Mouth Rinse route 2 (two) times daily. 03/20/21   Jason Schlatter, MD  Nystatin (GERHARDT'S BUTT CREAM) CREA Apply 1 application. topically 2 (two) times daily. 03/20/21   Jason Schlatter, MD  polyvinyl alcohol (LIQUIFILM TEARS) 1.4 % ophthalmic solution Place 1 drop into both eyes 4 (four) times daily as needed for dry eyes. 03/20/21   Jason Schlatter, MD  sertraline (ZOLOFT) 25 MG tablet Place 1 tablet (25 mg total) into feeding tube daily. 03/21/21   Jason Schlatter, MD    ___________________________________________________________________________________________________ Physical Exam:    10/02/2021    7:15 PM 10/02/2021    7:00 PM 10/02/2021    6:45 PM  Vitals with BMI  Systolic 037 543 606  Diastolic 53 57  78  Pulse 118 118 119     1. General:  in No  Acute distress  *  acutely ill -appearing 2. Psychological: Alert and   Oriented to self 3. Head/ENT:   Dry Mucous Membranes  Head Non traumatic, neck supple                           Poor Dentition 4. SKIN:  decreased Skin turgor,  Skin clean Dry and intact no rash 5. Heart: Regular rate and rhythm no  Murmur, no Rub or gallop 6. Lungs: no wheezes some crackles   7. Abdomen: Soft,  non-tender, Non distended  bowel sounds present 8. Lower extremities: no clubbing, cyanosis, no  edema 9. Neurologically Grossly intact, moving all 4 extremities equally  noncooperative 10. MSK: Normal range of motion  No foley  Chart has been reviewed  ______________________________________________________________________________________________  Assessment/Plan  76 y.o. male with medical history significant of dementia history of alcohol abuse transient A-fib not on anticoagulation History of Klebsiella aspiration pneumonia in the past history of bladder cancer status postresection, Dm2 History of incarcerated bowel inguinal hernia status postrepair  Admitted for   Sepsis, due to unspecified organism, unspecified whether acute organ dysfunction present   Aspiration Pneumonia of right lower lobe due to infectious organism, UTI      Present on Admission:  Severe sepsis (HCC)  Aspiration pneumonia (HCC)  Acute hypoxemic respiratory failure (HCC)  Dementia (HCC)  Acute metabolic encephalopathy  UTI (urinary tract infection)  Malnutrition of moderate degree  Hypomagnesemia  Hypophosphatemia  Low TSH level     Severe sepsis (HCC)  -SIRS criteria met with  elevated white blood cell count,       Component Value Date/Time   WBC 24.0 (H) 10/02/2021 1635   LYMPHSABS PENDING 10/02/2021 1635    tachycardia   fever   RR >20 Today's Vitals   10/02/21 1815 10/02/21 1845 10/02/21 1900 10/02/21 1915  BP: (!) 110/54 116/78 (!)  111/57 (!) 103/53  Pulse: (!) 117 (!) 119 (!) 118 (!) 118  Resp: 19 20 (!) 30 (!) 32  Temp: (!) 102.7 F (39.3 C)     TempSrc: Rectal     SpO2: 98% 100% 96% 97%  Weight:      Height:       Body mass index is 24.81 kg/m.  This patient meets SIRS Criteria and may be septic.   The recent clinical data is shown below. Vitals:   10/02/21 1815 10/02/21 1845 10/02/21 1900 10/02/21 1915  BP: (!) 110/54 116/78 (!) 111/57 (!) 103/53  Pulse: (!) 117 (!) 119 (!) 118 (!) 118  Resp: 19 20 (!) 30 (!) 32  Temp: (!) 102.7 F (39.3 C)     TempSrc: Rectal     SpO2: 98% 100% 96% 97%  Weight:      Height:        -Most likely source being:  pulmonary,    Patient meeting criteria for Severe sepsis with    evidence of end organ damage/organ dysfunction such as    elevated lactic acid >2     Component Value Date/Time   LATICACIDVEN 3.2 (HH) 10/02/2021 1635    acute metabolic encephalopathy    Acute hypoxia requiring new supplemental oxygen, SpO2: 97 % O2 Flow Rate (L/min): 2 L/min      - Obtain serial lactic acid and procalcitonin level.  - Initiated IV antibiotics in ER: Antibiotics Given (last 72 hours)     Date/Time Action Medication Dose Rate   10/02/21 1700 New Bag/Given   ceFEPIme (MAXIPIME) 2 g in sodium chloride 0.9 % 100 mL IVPB 2 g 200 mL/hr   10/02/21 1726 New Bag/Given  metroNIDAZOLE (FLAGYL) IVPB 500 mg 500 mg 100 mL/hr   10/02/21 1746 New Bag/Given   vancomycin (VANCOCIN) IVPB 1000 mg/200 mL premix 1,000 mg 200 mL/hr       Will continue  On meropenem vancomycin   - await results of blood and urine culture  - Rehydrate aggressively  Intravenous fluids were administered, 30cc/kg fluid   7:37 PM   Aspiration pneumonia (HCC) Continue broad spectrum coverage with cefepime and metronidazole also added vancomycin. Await results of sputum cultures Blood cultures COVID and influenza  Acute hypoxemic respiratory failure (HCC)   Likely due to:  Pneumonia,  Provide  O2 therapy and titrate as needed  Continuous pulse ox  check Pulse ox with ambulation prior to discharge    flutter valve ordered   Dementia ([place])  monitor for any sign of sundowning we will  Acute metabolic encephalopathy   - most likely multifactorial secondary to combination of  Infection  mild dehydration secondary to decreased by mouth intake,   polypharmacy   - Will rehydrate*  - treat underlining infection   - Hold contributing medications   - if no improvement may need further imaging to evaluate for CNS pathology pathology such as MRI of the brain   - neurological exam appears to be nonfocal but patient unable to cooperate fully   - VBG orderd   - ammonia ordered   UTI (urinary tract infection) currently on cefepime/ falgyl Await result of urine culture will change to meropenem given hx of ESBL in the past   Malnutrition of moderate degree Nutritional consult  DM2 (diabetes mellitus, type 2) (HCC)  - Order Sensitive  SSI     -  check TSH and HgA1C      Hypomagnesemia Will replace and recheck  Hypophosphatemia wil replace and recheck in AM  Low TSH level Will check t3 and T4 treat as needed   Other plan as per orders.  DVT prophylaxis:  SCD       Code Status:   DNR/DNI  as per  family  I had personally discussed CODE STATUS with family     Family Communication:   Family not at  Bedside  plan of care was discussed on the phone with  Daughter,   Disposition Plan:                           Back to current facility when stable                              Following barriers for discharge:                            Electrolytes corrected                                                              Afebrile, white count improving able to transition to PO antibiotics                      Would benefit from PT/OT eval prior to DC  Ordered  Swallow eval - SLP ordered                                      Transition of care  consulted                   Nutrition    consulted                                Palliative care    consulted                                       Consults called: none  Admission status:  ED Disposition     ED Disposition  Admit   Condition  --   [place]: [place] [[id number]]  Level of Care: Stepdown [14]  Admit to SDU based on following criteria: Respiratory Distress:  Frequent assessment and/or intervention to maintain adequate ventilation/respiration, pulmonary toilet, and respiratory treatment.  May admit patient to [name] or Jason [name] if equivalent level of care is available:: No  Covid Evaluation: Confirmed COVID Negative  Diagnosis: Severe sepsis [facility]) [[id number]]  Admitting Physician: Jason [name] [[id number]]  Attending Physician: Jason [name] [[id number]]  Certification:: I certify this patient will need inpatient services for at least 2 midnights  Estimated Length of Stay: 2            inpatient     I Expect 2 midnight stay secondary to severity of patient's current illness need for inpatient interventions justified by the following:  hemodynamic instability despite optimal treatment (tachycardia )  Severe lab/radiological/exam abnormalities including:    sepsis and extensive comorbidities including:  dementia   malignancy,    That are currently affecting medical management.   I expect  patient to be hospitalized for 2 midnights requiring inpatient medical care.  Patient is at high risk for adverse outcome (such as loss of life or disability) if not treated.  Indication for inpatient stay as follows:  Severe change from baseline regarding mental status   inability to maintain oral hydration    New or worsening hypoxia   Need for IV antibiotics, IV fluids, I     Level of care        progressive tele indefinitely please discontinue once patient no longer qualifies COVID-19 Labs    Lab Results   Component Value Date   [place] NEGATIVE 10/02/2021     Precautions: admitted as   Covid Negative     Jason [name] 10/02/2021, 9:41 PM    [facility]     after 2 AM please page floor coverage PA If 7AM-7PM, please contact the day team taking care of the patient using [email or web address]   Patient was evaluated in the context of the global COVID-19 pandemic, which necessitated consideration that the patient might be at risk for infection with the SARS-CoV-2 virus that causes COVID-19. Institutional protocols and algorithms that pertain to the evaluation of patients at risk for COVID-19 are in a state of rapid change based on information released by regulatory bodies including the CDC and federal and state organizations. These policies and algorithms were followed during the patient's care.

## 2021-10-03 DIAGNOSIS — F0390 Unspecified dementia without behavioral disturbance: Secondary | ICD-10-CM | POA: Diagnosis not present

## 2021-10-03 DIAGNOSIS — A419 Sepsis, unspecified organism: Secondary | ICD-10-CM | POA: Diagnosis not present

## 2021-10-03 DIAGNOSIS — J9601 Acute respiratory failure with hypoxia: Secondary | ICD-10-CM | POA: Diagnosis not present

## 2021-10-03 DIAGNOSIS — G9341 Metabolic encephalopathy: Secondary | ICD-10-CM | POA: Diagnosis not present

## 2021-10-03 DIAGNOSIS — E0590 Thyrotoxicosis, unspecified without thyrotoxic crisis or storm: Secondary | ICD-10-CM | POA: Diagnosis present

## 2021-10-03 DIAGNOSIS — Z7189 Other specified counseling: Secondary | ICD-10-CM | POA: Diagnosis not present

## 2021-10-03 DIAGNOSIS — J690 Pneumonitis due to inhalation of food and vomit: Secondary | ICD-10-CM | POA: Diagnosis not present

## 2021-10-03 DIAGNOSIS — R6520 Severe sepsis without septic shock: Secondary | ICD-10-CM | POA: Diagnosis not present

## 2021-10-03 LAB — COMPREHENSIVE METABOLIC PANEL
ALT: 15 U/L (ref 0–44)
AST: 21 U/L (ref 15–41)
Albumin: 2.2 g/dL — ABNORMAL LOW (ref 3.5–5.0)
Alkaline Phosphatase: 96 U/L (ref 38–126)
Anion gap: 7 (ref 5–15)
BUN: 18 mg/dL (ref 8–23)
CO2: 25 mmol/L (ref 22–32)
Calcium: 8.3 mg/dL — ABNORMAL LOW (ref 8.9–10.3)
Chloride: 107 mmol/L (ref 98–111)
Creatinine, Ser: 0.9 mg/dL (ref 0.61–1.24)
GFR, Estimated: >60 mL/min (ref >60)
Glucose, Bld: 113 mg/dL — ABNORMAL HIGH (ref 70–99)
Potassium: 4 mmol/L (ref 3.5–5.1)
Sodium: 139 mmol/L (ref 135–145)
Total Bilirubin: 0.4 mg/dL (ref 0.3–1.2)
Total Protein: 6.6 g/dL (ref 6.5–8.1)

## 2021-10-03 LAB — LACTIC ACID, PLASMA
Lactic Acid, Venous: 0.9 mmol/L (ref 0.5–1.9)
Lactic Acid, Venous: 1.8 mmol/L (ref 0.5–1.9)

## 2021-10-03 LAB — HEMOGLOBIN A1C
Hgb A1c MFr Bld: 6 % — ABNORMAL HIGH (ref 4.8–5.6)
Mean Plasma Glucose: 125.5 mg/dL

## 2021-10-03 LAB — GLUCOSE, CAPILLARY: Glucose-Capillary: 87 mg/dL (ref 70–99)

## 2021-10-03 LAB — URINALYSIS, COMPLETE (UACMP) WITH MICROSCOPIC
Bilirubin Urine: NEGATIVE
Glucose, UA: NEGATIVE mg/dL
Ketones, ur: NEGATIVE mg/dL
Nitrite: NEGATIVE
Protein, ur: NEGATIVE mg/dL
Specific Gravity, Urine: 1.011 (ref 1.005–1.030)
WBC, UA: >50 WBC/hpf — ABNORMAL HIGH (ref 0–5)
pH: 7 (ref 5.0–8.0)

## 2021-10-03 LAB — CBG MONITORING, ED
Glucose-Capillary: 96 mg/dL (ref 70–99)
Glucose-Capillary: 91 mg/dL (ref 70–99)
Glucose-Capillary: 130 mg/dL — ABNORMAL HIGH (ref 70–99)
Glucose-Capillary: 102 mg/dL — ABNORMAL HIGH (ref 70–99)
Glucose-Capillary: 76 mg/dL (ref 70–99)

## 2021-10-03 LAB — CBC
HCT: 27.5 % — ABNORMAL LOW (ref 39.0–52.0)
Hemoglobin: 8 g/dL — ABNORMAL LOW (ref 13.0–17.0)
MCH: 19.3 pg — ABNORMAL LOW (ref 26.0–34.0)
MCHC: 29.1 g/dL — ABNORMAL LOW (ref 30.0–36.0)
MCV: 66.4 fL — ABNORMAL LOW (ref 80.0–100.0)
Platelets: 324 10*3/uL (ref 150–400)
RBC: 4.14 MIL/uL — ABNORMAL LOW (ref 4.22–5.81)
RDW: 22.5 % — ABNORMAL HIGH (ref 11.5–15.5)
WBC: 13.4 10*3/uL — ABNORMAL HIGH (ref 4.0–10.5)
nRBC: 0 % (ref 0.0–0.2)

## 2021-10-03 LAB — HIV ANTIBODY (ROUTINE TESTING W REFLEX): HIV Screen 4th Generation wRfx: NONREACTIVE

## 2021-10-03 LAB — OSMOLALITY, URINE: Osmolality, Ur: 654 mOsm/kg (ref 300–900)

## 2021-10-03 LAB — TSH: TSH: 0.172 u[IU]/mL — ABNORMAL LOW (ref 0.350–4.500)

## 2021-10-03 LAB — PHOSPHORUS: Phosphorus: 4.2 mg/dL (ref 2.5–4.6)

## 2021-10-03 LAB — SODIUM, URINE, RANDOM: Sodium, Ur: 101 mmol/L

## 2021-10-03 LAB — OSMOLALITY: Osmolality: 293 mOsm/kg (ref 275–295)

## 2021-10-03 LAB — STREP PNEUMONIAE URINARY ANTIGEN: Strep Pneumo Urinary Antigen: NEGATIVE

## 2021-10-03 LAB — PREALBUMIN: Prealbumin: 8 mg/dL — ABNORMAL LOW (ref 18–38)

## 2021-10-03 LAB — MAGNESIUM: Magnesium: 1.9 mg/dL (ref 1.7–2.4)

## 2021-10-03 LAB — CREATININE, URINE, RANDOM: Creatinine, Urine: 72 mg/dL

## 2021-10-03 LAB — T4, FREE: Free T4: 1.04 ng/dL (ref 0.61–1.12)

## 2021-10-03 LAB — MRSA NEXT GEN BY PCR, NASAL: MRSA by PCR Next Gen: DETECTED — AB

## 2021-10-03 MED ORDER — SODIUM CHLORIDE 0.9 % IV SOLN: INTRAVENOUS | Status: DC

## 2021-10-03 MED ORDER — ALBUTEROL SULFATE (2.5 MG/3ML) 0.083% IN NEBU: 2.5000 mg | INHALATION_SOLUTION | RESPIRATORY_TRACT | Status: DC | PRN

## 2021-10-03 MED ORDER — MAGNESIUM SULFATE 2 GM/50ML IV SOLN
2.0000 g | Freq: Once | INTRAVENOUS | Status: AC
  Administered 2021-10-03: 2 g via INTRAVENOUS
  Filled 2021-10-03: qty 50

## 2021-10-03 MED ORDER — ACETAMINOPHEN 325 MG PO TABS: 650.0000 mg | ORAL_TABLET | Freq: Four times a day (QID) | ORAL | Status: DC | PRN

## 2021-10-03 MED ORDER — IPRATROPIUM-ALBUTEROL 0.5-2.5 (3) MG/3ML IN SOLN: 3.0000 mL | RESPIRATORY_TRACT | Status: DC | PRN

## 2021-10-03 MED ORDER — CHLORHEXIDINE GLUCONATE CLOTH 2 % EX PADS
6.0000 | MEDICATED_PAD | Freq: Every day | CUTANEOUS | Status: DC
  Administered 2021-10-03: 6 via TOPICAL

## 2021-10-03 MED ORDER — ACETAMINOPHEN 650 MG RE SUPP: 650.0000 mg | Freq: Four times a day (QID) | RECTAL | Status: DC | PRN

## 2021-10-03 MED ORDER — POTASSIUM PHOSPHATES 15 MMOLE/5ML IV SOLN
15.0000 mmol | Freq: Once | INTRAVENOUS | Status: AC
  Administered 2021-10-03: 15 mmol via INTRAVENOUS
  Filled 2021-10-03: qty 5

## 2021-10-03 NOTE — Consult Note (Cosign Needed Addendum)
Consultation Note Date: 10/03/2021   Patient Name: Jason [name]  DOB: Oct 05, 1945  MRN: [id number]  Age / Sex: 76 y.o., male  PCP: Janifer Adie, MD Referring Physician: Dwyane Dee, MD  Reason for Consultation: Goals of care  HPI/Patient Profile: 76 y.o. male  with past medical history of dementia, A-fib, dysphagia, bladder cancer, admitted on 10/02/2021 with sepsis related to aspiration pneumonia, and UTI.  Palliative medicine consulted for goals of care.  Primary Decision Maker LEGAL GUARDIAN - [name]  Discussion: Chart reviewed including labs, imaging, progress notes from this and prior admissions.  Patient was last seen by inpatient palliative medicine in March of this year.  At that time the plan was made to discharge him to long-term care with hospice.  Unclear if hospice was implemented. Patient is awake and alert, he is pleasantly confused.  He is unable to tell me his name or answer any other questions coherently.  Called his daughter Sammy Cassar for goals of care discussion.  Vonzella shares that Demyan is primarily bedbound he is able to transfer to a wheelchair and she visits him and takes him outside sometimes.  She does remember meeting with palliative in the past, she does not recall if patient was started on hospice care. We discussed his trajectory of his acute and chronic illnesses.  Reviewed with Vonzella what aspiration pneumonia is and that her dad was very ill when he initially presented to ED.  Until the wishes for her father to be admitted into receive treatment for his sepsis.  We discussed limitations that might be set if he were to worsen.  He is currently DNR CODE STATUS.  Vonzella shares that she feels she was pushed into this in the past-we had further discussion and a close of discussion she agreed that DNR continues to be in patient's best interest. He would not  want a feeding tube. She shared frustrations with some of the care that he has received in his nursing facility. At close of our discussion Vonzella shared that patient has a court appointed legal guardian.  SUMMARY OF RECOMMENDATIONS -Continue current care -DNR, no feeding tube -I have contacted [facility] for further information regarding patient's care and if hospice was ever implemented -I located patient's legal guardian information on his nursing home paperwork called [name] and left a message requesting return call -PMT will continue to follow  Addendum- spoke with [name] social worker with [facility]- patient is receiving end of life care through [facility], they have a MOST form indicating: DNR, comfort measures only- do not transfer to hospital- no antibiotics, no feeding tube- patient was transferred to hospital erroneously by RN at facility who was to contact [facility] before transferring anywhere and did not.  Marcie Bal is aware that patient has court appointed guardian, but it is unclear who the decision maker for patient is.  I have requested a copy of the MOST form, and am awaiting to hear back from legal guardian for further [place] discussion.   Code Status/Advance Care Planning:  DNR   Prognosis:   Unable to determine  Discharge Planning: To Be Determined  Primary Diagnoses: Present on Admission:  Severe sepsis ([place])  Aspiration pneumonia ([place])  Acute hypoxemic respiratory failure (HCC)  Dementia (HCC)  Acute metabolic encephalopathy  UTI (urinary tract infection)  Malnutrition of moderate degree  Hypomagnesemia  Hypophosphatemia  Subclinical hyperthyroidism   Review of Systems  Unable to perform ROS: Dementia    Physical Exam Vitals and nursing note reviewed.  Neurological:     Mental Status: He is alert. He is disoriented.  Psychiatric:     Comments: Pleasantly confused     Vital Signs: BP 108/68   Pulse 89   Temp 98.5 F (36.9 C) (Oral)   Resp (!) 23   Ht  '5\' 8"'$  (1.727 m)   Wt 74 kg   SpO2 99%   BMI 24.81 kg/m  Pain Scale: PAINAD       SpO2: SpO2: 99 % O2 Device:SpO2: 99 % O2 Flow Rate: .O2 Flow Rate (L/min): 2 L/min  IO: Intake/output summary:  Intake/Output Summary (Last 24 hours) at 10/03/2021 1443 Last data filed at 10/03/2021 0631 Gross per 24 hour  Intake 3806.35 ml  Output --  Net 3806.35 ml    LBM:   Baseline Weight: Weight: 74 kg Most recent weight: Weight: 74 kg       Thank you for this consult. Palliative medicine will continue to follow and assist as needed.   Total time: 120 minutes  Greater than 50%  of this time was spent counseling and coordinating care related to the above assessment and plan.  Signed by: Mariana Kaufman, AGNP-C Palliative Medicine    Please contact Palliative Medicine Team phone at [phone] for questions and concerns.  For individual provider: See Shea Evans

## 2021-10-03 NOTE — Assessment & Plan Note (Signed)
-   continue SSI and CBG monitoring  

## 2021-10-03 NOTE — Hospital Course (Signed)
Jason [name] is a 76 yo male with PMH dementia, alcohold use, afib, bladder cancer, DM II who presented with altered mentation.  He resides at [facility] and was given Ativan for a dental procedure then was found to be lethargic and altered. CXR showed right basilar opacity concerning for possible aspiration.  CT head was unremarkable. He was started on Vanco and meropenem to cover for pneumonia and history of ESBL UTI.  He was admitted for further work-up and monitoring.  Palliative care was consulted and the MOST form was received from patient indicating the patient was a DNR and comfort measures with no antibiotics and no IV fluids as well as no feeding tube.  His legal guardian has confirmed the patient comfort measures only and should be discharged back to the facility.  We will discontinue all his antibiotics and place him on comfort measures medications including morphine concentrate, lorazepam and Haldol with further care per hospice protocol.  Patient is stable for discharge at this time back to his facility with hospice

## 2021-10-03 NOTE — Assessment & Plan Note (Signed)
-   History of similar - TSH 0.172 on admission - Free T4, 1.04

## 2021-10-03 NOTE — Assessment & Plan Note (Signed)
Will check t3 and T4 treat as needed

## 2021-10-03 NOTE — Assessment & Plan Note (Signed)
-   febrile, tachycardia, tachypnea, lactic acidosis; suspected PNA and UTI - continue vanc and meropenem - follow up cultures

## 2021-10-03 NOTE — Assessment & Plan Note (Signed)
-  See severe sepsis 

## 2021-10-03 NOTE — Assessment & Plan Note (Signed)
wil replace and recheck in AM

## 2021-10-03 NOTE — Progress Notes (Signed)
10/03/21 0900  SLP Visit Information  SLP Received On 10/03/21  Subjective  Subjective pt awake in bed  Patient/Family Stated Goal to eat  General Information  Date of Onset 10/03/21  HPI 76 yo male with dementia, h/o ETOH use, bladder cancer post resection, DM2 adm with severe sepsis secondary to aspiration pna.  CXR showed right lower lobe pna- pt also with UTI.  Swallow eval ordered.  Type of Study Bedside Swallow Evaluation  Previous Swallow Assessment mbs 03/2021 d3/thin  Diet Prior to this Study NPO  Temperature Spikes Noted No  Respiratory Status Room air  History of Recent Intubation No  Behavior/Cognition Cooperative  Oral Cavity Assessment WFL  Oral Care Completed by SLP No  Oral Cavity - Dentition Poor condition  Vision Functional for self-feeding  Self-Feeding Abilities Able to feed self  Patient Positioning Upright in bed  Baseline Vocal Quality Normal  Volitional Cough Cognitively unable to elicit  Volitional Swallow Unable to elicit  Oral Assessment (Complete on admission/transfer/every shift)  Oral Assessment  (WDL) WDL  Level of Consciousness Alert  Is patient on any of following O2 devices? None of the above  Nutritional status No high risk factors  Oral Assessment Risk  Low Risk  Oral Motor/Sensory Function  Overall Oral Motor/Sensory Function  (lingual deviation slighty to left upon protrusion, o/w unremarkable)  Ice Chips  Ice chips WFL  Presentation Spoon  Thin Liquid  Thin Liquid WFL  Presentation Cup;Self Fed;Spoon;Straw  Nectar Thick Liquid  Nectar Thick Liquid NT  Honey Thick Liquid  Honey Thick Liquid NT  Puree  Puree WFL  Presentation Self Fed;Spoon  Solid  Solid WFL  Presentation Self Fed  SLP - End of Session  Patient left in bed;with call bell/phone within reach;with bed alarm set  Nurse Communication Diet recommendation  Suspected Esophageal Findings  Suspected Esophageal Findings Other (comment) (belch x1 - pt admits to reflux  issues)  SLP Assessment  Clinical Impression Statement (ACUTE ONLY) Patient presents with functional oropharyngeal swallow ability based on clinical swallow evaluation.  He was observed consuming entire sandwich, graham crackers x4, 4 ounces applesauce, approx 32 ounces water and or juice. No s/s of aspiration observed with all po consumed.  Oral cavity clear upon completion of snack. Pt did not follow 3 ounce [place] water screen but there were no indications of dysphagia.  He does have poor dentition and several teeth missing, thus recommend dys3/thin and medications as tolerated. He does endorse reflux - and thus recommend stay upright after meal.  SLP Visit Diagnosis Dysphagia, unspecified (R13.10)  Impact on safety and function Mild aspiration risk  Other Related Risk Factors Cognitive impairment  Swallow Evaluation Recommendations  SLP Diet Recommendations Dysphagia 3 (Mech soft);Thin liquid  Liquid Administration via Cup;Straw  Medication Administration Whole meds with liquid  Supervision Intermittent supervision to cue for compensatory strategies  Compensations Small sips/bites  Postural Changes Remain upright for at least 30 minutes after po intake;Seated upright at 90 degrees  Treatment Plan  Oral Care Recommendations Oral care BID  Other Recommendations Clarify dietary restrictions  Treatment Recommendations No treatment recommended at this time  Follow Up Recommendations No SLP follow up  Assistance recommended at discharge Frequent or constant Supervision/Assistance  Functional Status Assessment Patient has not had a recent decline in their functional status  Individuals Consulted  Consulted and Agree with Results and Recommendations Patient unable/family or caregiver not available  Progression Toward Goals  Progression toward goals Goals met, education completed, patient discharged from  SLP  SLP Time Calculation  SLP Start Time (ACUTE ONLY) 0935  SLP Stop Time (ACUTE ONLY) 1007   SLP Time Calculation (min) (ACUTE ONLY) 32 min  SLP Evaluations  $ SLP Speech Visit 1 Visit  SLP Evaluations  $BSS Swallow 1 Procedure  Kathleen Lime, MS [facility] SLP Acute Rehab Services Office [phone] Pager [phone]

## 2021-10-03 NOTE — Assessment & Plan Note (Signed)
-   Replete as needed 

## 2021-10-03 NOTE — Assessment & Plan Note (Signed)
-   Suspected due to aspiration and possibly from some sedation - Continue weaning oxygen as able

## 2021-10-03 NOTE — Evaluation (Signed)
OT Cancellation Note and Discharge  Patient Details Name: EOIN WILLDEN MRN: [id number] DOB: Nov 24, 1945   Cancelled Treatment:    Reason Eval/Treat Not Completed: OT screened, no needs identified, will sign off. Received a chat text from PT that pt is to go back to the facility where he resides, can feed himself normally and she witnessed him doing this, needs A for standing and transfers which is his baseline.   [place], OTR/L Acute Rehab Services [facility] [phone] Office [phone]    Almon Register 10/03/2021, 3:14 PM

## 2021-10-03 NOTE — Evaluation (Signed)
Physical Therapy Evaluation Patient Details Name: Jason [name] MRN: [id number] DOB: Sep 08, 1945 Today's Date: 10/03/2021  History of Present Illness  Jason [name] is a 76 yo male with PMH dementia, alcohold use, afib, bladder cancer, DM II who presented with altered mentation.  He resides at [facility] and was given Ativan for a dental procedure then was found to be lethargic and altered.  CXR showed right basilar opacity concerning for possible aspiration.  CT head was unremarkable.  Clinical Impression  Patient admitted for above  problems. Appears patient comes from [place], Patient not able to provide information.  Patient did follow simple directions, inconsistently..  Left leg tends to have increased extensor tone , Did partially stand with max assistance and HHA(did not have RW available).  Patient noted to feed self after set up. Patient should be able to return to [facility] for LTC.  PT will follow to  decrease burden of care while in acute care. Pt admitted with above diagnosis.  Pt currently with functional limitations due to the deficits listed below (see PT Problem List). Pt will benefit from skilled PT to increase their independence and safety with mobility to allow discharge to the venue listed below.          Recommendations for follow up therapy are one component of a multi-disciplinary discharge planning process, led by the attending physician.  Recommendations may be updated based on patient status, additional functional criteria and insurance authorization.  Follow Up Recommendations Long-term institutional care without follow-up therapy Can patient physically be transported by private vehicle: No    Assistance Recommended at Discharge Frequent or constant Supervision/Assistance  Patient can return home with the following  A lot of help with walking and/or transfers;A lot of help with bathing/dressing/bathroom    Equipment Recommendations None recommended by PT   Recommendations for Other Services       Functional Status Assessment       Precautions / Restrictions Precautions Precautions: Fall      Mobility  Bed Mobility Overal bed mobility: Needs Assistance Bed Mobility: Supine to Sit, Sit to Supine     Supine to sit: Max assist Sit to supine: Max assist   General bed mobility comments: patient extending legs, difficult  getting trunk forward, finally able to get seated on edge, tending to lean backwards.    Transfers Overall transfer level: Needs assistance Equipment used: 1 person hand held assist Transfers: Sit to/from Stand Sit to Stand: Max assist           General transfer comment: multimodal cues to stand, LLE tends to slide forward. Unable to safely stand from  bed with +1 assistance.    Ambulation/Gait                  Stairs            Wheelchair Mobility    Modified Rankin (Stroke Patients Only)       Balance Overall balance assessment: Needs assistance Sitting-balance support: Bilateral upper extremity supported, Feet supported Sitting balance-Leahy Scale: Poor Sitting balance - Comments: strong posterior bias and  decreased control of the legs to stnad                                     Pertinent Vitals/Pain Pain Assessment Pain Assessment: No/denies pain    Home Living Family/patient expects to be discharged to:: Skilled nursing facility  Prior Function Prior Level of Function : Patient poor historian/Family not available             Mobility Comments: unsure  of ability to amb.       Hand Dominance        Extremity/Trunk Assessment   Upper Extremity Assessment Upper Extremity Assessment: Overall WFL for tasks assessed (able to fed self when sitting upright in bed)    Lower Extremity Assessment Lower Extremity Assessment: LLE deficits/detail;RLE deficits/detail LLE Deficits / Details: leg tends to extend with  efforts to move and stand up.decreased knee flexion    Cervical / Trunk Assessment Cervical / Trunk Assessment: Normal  Communication   Communication: Expressive difficulties  Cognition Arousal/Alertness: Awake/alert Behavior During Therapy: WFL for tasks assessed/performed Overall Cognitive Status: History of cognitive impairments - at baseline                                 General Comments: followed simple directions inconsistently,        General Comments      Exercises     Assessment/Plan    PT Assessment Patient needs continued PT services  PT Problem List Decreased strength;Decreased activity tolerance;Decreased cognition;Decreased mobility;Decreased balance;Decreased knowledge of use of DME       PT Treatment Interventions Gait training;DME instruction;Therapeutic activities;Balance training;Cognitive remediation;Functional mobility training    PT Goals (Current goals can be found in the Care Plan section)  Acute Rehab PT Goals PT Goal Formulation: Patient unable to participate in goal setting Time For Goal Achievement: 10/17/21 Potential to Achieve Goals: Fair    Frequency Min 2X/week     Co-evaluation               AM-PAC PT "6 Clicks" Mobility  Outcome Measure Help needed turning from your back to your side while in a flat bed without using bedrails?: Total Help needed moving from lying on your back to sitting on the side of a flat bed without using bedrails?: Total Help needed moving to and from a bed to a chair (including a wheelchair)?: Total Help needed standing up from a chair using your arms (e.g., wheelchair or bedside chair)?: Total Help needed to walk in hospital room?: Total Help needed climbing 3-5 steps with a railing? : Total 6 Click Score: 6    End of Session Equipment Utilized During Treatment: Gait belt Activity Tolerance: Patient tolerated treatment well Patient left: in bed;with call bell/phone within  reach Nurse Communication: Mobility status PT Visit Diagnosis: Unsteadiness on feet (R26.81)    Time: 6384-5364 PT Time Calculation (min) (ACUTE ONLY): 13 min   Charges:   PT Evaluation $PT Eval Low Complexity: [place] Office [phone] Weekend [phone]   Jason [name] 10/03/2021, 3:16 PM

## 2021-10-03 NOTE — ED Notes (Signed)
Bladder scan at 256

## 2021-10-03 NOTE — Progress Notes (Signed)
Progress Note    Jason [name]   GDJ:[id number]  DOB: 27-Nov-1945  DOA: 10/02/2021     1 PCP: Jason Adie, MD  Initial CC: AMS  Hospital Course: Jason [name] is a 76 yo male with PMH dementia, alcohold use, afib, bladder cancer, DM II who presented with altered mentation.  He resides at [facility] and was given Ativan for a dental procedure then was found to be lethargic and altered. CXR showed right basilar opacity concerning for possible aspiration.  CT head was unremarkable. He was started on Vanco and meropenem to cover for pneumonia and history of ESBL UTI.  He was admitted for further work-up and monitoring.  Interval History:  Seen this morning in the ER.  He was resting comfortably still appearing confused.  Denied any difficulty breathing.  Assessment and Plan: * Severe sepsis (HCC) - febrile, tachycardia, tachypnea, lactic acidosis; suspected PNA and UTI - continue vanc and meropenem - follow up cultures  Aspiration pneumonia (HCC) - some probable aspiration as ativan and dental procedure - continue meropenem   UTI (urinary tract infection) - See severe sepsis  Acute hypoxemic respiratory failure (HCC) - Suspected due to aspiration and possibly from some sedation - Continue weaning oxygen as able  Acute metabolic encephalopathy - probable combo from infection and dementia - treating infections  Dementia ([place]) - Continue delirium precautions  Subclinical hyperthyroidism - History of similar - TSH 0.172 on admission - Free T4, 1.04  Hypophosphatemia - Replete as needed  Hypomagnesemia - Replete as needed  DM2 (diabetes mellitus, type 2) ([place]) - continue SSI and CBG monitoring   Malnutrition of moderate degree - RD consult, appreciate assistance - continue encouraging diet    Old records reviewed in assessment of this patient  Antimicrobials: Vancomycin Meropenem  DVT prophylaxis:  SCDs Start: 10/03/21 0336   Code Status:   Code  Status: DNR  Mobility Assessment (last 72 hours)     Mobility Assessment   No documentation.           Barriers to discharge:  Disposition Plan: Back to SNF in 2 to 3 days Status is: Inpatient  Objective: Blood pressure 116/62, pulse 90, temperature 98.5 F (36.9 C), temperature source Oral, resp. rate (!) 27, height '5\' 8"'$  (1.727 m), weight 74 kg, SpO2 96 %.  Examination:  Physical Exam Constitutional:      General: He is not in acute distress.    Appearance: Normal appearance.  HENT:     Head: Normocephalic and atraumatic.     Mouth/Throat:     Mouth: Mucous membranes are moist.  Eyes:     Extraocular Movements: Extraocular movements intact.  Cardiovascular:     Rate and Rhythm: Normal rate and regular rhythm.     Heart sounds: Normal heart sounds.  Pulmonary:     Effort: Pulmonary effort is normal. No respiratory distress.     Breath sounds: Rhonchi present. No wheezing.  Abdominal:     General: Bowel sounds are normal. There is no distension.     Palpations: Abdomen is soft.     Tenderness: There is no abdominal tenderness.  Musculoskeletal:        General: Normal range of motion.     Cervical back: Normal range of motion and neck supple.  Skin:    General: Skin is warm and dry.  Neurological:     Mental Status: He is alert.     Comments: Follows commands and moves all 4 extremities.  Underlying dementia  appreciated  Psychiatric:        Mood and Affect: Mood normal.        Behavior: Behavior normal.      Consultants:    Procedures:    Data Reviewed: Results for orders placed or performed during the hospital encounter of 10/02/21 (from the past 24 hour(s))  Resp Panel by RT-PCR (Flu A&B, Covid) Anterior Nasal Swab     Status: None   Collection Time: 10/02/21  4:35 PM   Specimen: Anterior Nasal Swab  Result Value Ref Range   SARS Coronavirus 2 by RT PCR NEGATIVE NEGATIVE   Influenza A by PCR NEGATIVE NEGATIVE   Influenza B by PCR NEGATIVE NEGATIVE   Lactic acid, plasma     Status: Abnormal   Collection Time: 10/02/21  4:35 PM  Result Value Ref Range   Lactic Acid, Venous 3.2 (HH) 0.5 - 1.9 mmol/L  Comprehensive metabolic panel     Status: Abnormal   Collection Time: 10/02/21  4:35 PM  Result Value Ref Range   Sodium 138 135 - 145 mmol/L   Potassium 3.6 3.5 - 5.1 mmol/L   Chloride 105 98 - 111 mmol/L   CO2 25 22 - 32 mmol/L   Glucose, Bld 119 (H) 70 - 99 mg/dL   BUN 23 8 - 23 mg/dL   Creatinine, Ser 0.94 0.61 - 1.24 mg/dL   Calcium 8.6 (L) 8.9 - 10.3 mg/dL   Total Protein 8.0 6.5 - 8.1 g/dL   Albumin 2.8 (L) 3.5 - 5.0 g/dL   AST 26 15 - 41 U/L   ALT 16 0 - 44 U/L   Alkaline Phosphatase 114 38 - 126 U/L   Total Bilirubin 0.4 0.3 - 1.2 mg/dL   GFR, Estimated >60 >60 mL/min   Anion gap 8 5 - 15  CBC with Differential     Status: Abnormal   Collection Time: 10/02/21  4:35 PM  Result Value Ref Range   WBC 24.0 (H) 4.0 - 10.5 K/uL   RBC 5.37 4.22 - 5.81 MIL/uL   Hemoglobin 10.4 (L) 13.0 - 17.0 g/dL   HCT 35.5 (L) 39.0 - 52.0 %   MCV 66.1 (L) 80.0 - 100.0 fL   MCH 19.4 (L) 26.0 - 34.0 pg   MCHC 29.3 (L) 30.0 - 36.0 g/dL   RDW 23.2 (H) 11.5 - 15.5 %   Platelets 476 (H) 150 - 400 K/uL   nRBC 0.0 0.0 - 0.2 %   Neutrophils Relative % 93 %   Neutro Abs 22.4 (H) 1.7 - 7.7 K/uL   Lymphocytes Relative 2 %   Lymphs Abs 0.5 (L) 0.7 - 4.0 K/uL   Monocytes Relative 4 %   Monocytes Absolute 1.0 0.1 - 1.0 K/uL   Eosinophils Relative 0 %   Eosinophils Absolute 0.0 0.0 - 0.5 K/uL   Basophils Relative 0 %   Basophils Absolute 0.0 0.0 - 0.1 K/uL   Immature Granulocytes 1 %   Abs Immature Granulocytes 0.13 (H) 0.00 - 0.07 K/uL   Polychromasia PRESENT    Target Cells PRESENT   Protime-INR     Status: None   Collection Time: 10/02/21  4:35 PM  Result Value Ref Range   Prothrombin Time 13.8 11.4 - 15.2 seconds   INR 1.1 0.8 - 1.2  APTT     Status: None   Collection Time: 10/02/21  4:35 PM  Result Value Ref Range   aPTT 29 24 - 36  seconds  Blood Culture (routine  x 2)     Status: None (Preliminary result)   Collection Time: 10/02/21  4:35 PM   Specimen: BLOOD  Result Value Ref Range   Specimen Description      BLOOD LEFT ANTECUBITAL Performed at [facility] [place] [address] [place], [place] [address]    Special Requests      BOTTLES DRAWN AEROBIC AND ANAEROBIC Blood Culture adequate volume Performed at [facility] [address] [place], [place] [address]    Culture      NO GROWTH < 12 HOURS Performed at [facility] [address] [place], [place] [address]    Report Status PENDING   Blood Culture (routine x 2)     Status: None (Preliminary result)   Collection Time: 10/02/21  4:54 PM   Specimen: BLOOD  Result Value Ref Range   Specimen Description      BLOOD BLOOD RIGHT FOREARM Performed at [facility] [address], [place], [place] [address]    Special Requests      BOTTLES DRAWN AEROBIC AND ANAEROBIC Blood Culture adequate volume Performed at [facility] [address], [place], [place] [address]    Culture      NO GROWTH < 12 HOURS Performed at [facility] [address] [place], [place] [address]    Report Status PENDING   Urinalysis, Routine w reflex microscopic Urine, Catheterized     Status: Abnormal   Collection Time: 10/02/21  6:22 PM  Result Value Ref Range   Color, Urine YELLOW YELLOW   APPearance CLOUDY (A) CLEAR   Specific Gravity, Urine 1.014 1.005 - 1.030   pH 8.0 5.0 - 8.0   Glucose, UA NEGATIVE NEGATIVE mg/dL   Hgb urine dipstick SMALL (A) NEGATIVE   Bilirubin Urine NEGATIVE NEGATIVE   Ketones, ur NEGATIVE NEGATIVE mg/dL   Protein, ur 30 (A) NEGATIVE mg/dL   Nitrite NEGATIVE NEGATIVE   Leukocytes,Ua MODERATE (A) NEGATIVE   RBC / HPF >50 (H) 0 - 5 RBC/hpf   WBC, UA >50 (H) 0 - 5 WBC/hpf   Bacteria, UA FEW (A) NONE SEEN   Squamous Epithelial / LPF 0-5 0 - 5   WBC Clumps  PRESENT    Mucus PRESENT   Lactic acid, plasma     Status: Abnormal   Collection Time: 10/02/21  6:35 PM  Result Value Ref Range   Lactic Acid, Venous 2.8 (HH) 0.5 - 1.9 mmol/L  Urinalysis, Complete w Microscopic     Status: Abnormal   Collection Time: 10/02/21  7:35 PM  Result Value Ref Range   Color, Urine YELLOW YELLOW   APPearance HAZY (A) CLEAR   Specific Gravity, Urine 1.011 1.005 - 1.030   pH 7.0 5.0 - 8.0   Glucose, UA NEGATIVE NEGATIVE mg/dL   Hgb urine dipstick SMALL (A) NEGATIVE   Bilirubin Urine NEGATIVE NEGATIVE   Ketones, ur NEGATIVE NEGATIVE mg/dL   Protein, ur NEGATIVE NEGATIVE mg/dL   Nitrite NEGATIVE NEGATIVE   Leukocytes,Ua LARGE (A) NEGATIVE   RBC / HPF 6-10 0 - 5 RBC/hpf   WBC, UA >50 (H) 0 - 5 WBC/hpf   Bacteria, UA RARE (A) NONE SEEN   Squamous Epithelial / LPF 6-10 0 - 5   WBC Clumps PRESENT    Mucus PRESENT   Rapid urine drug screen (hospital performed)     Status: None   Collection Time: 10/02/21  8:36 PM  Result Value Ref  Range   Opiates NONE DETECTED NONE DETECTED   Cocaine NONE DETECTED NONE DETECTED   Benzodiazepines NONE DETECTED NONE DETECTED   Amphetamines NONE DETECTED NONE DETECTED   Tetrahydrocannabinol NONE DETECTED NONE DETECTED   Barbiturates NONE DETECTED NONE DETECTED  Strep pneumoniae urinary antigen     Status: None   Collection Time: 10/02/21  8:37 PM  Result Value Ref Range   Strep Pneumo Urinary Antigen NEGATIVE NEGATIVE  Creatinine, urine, random     Status: None   Collection Time: 10/02/21  8:37 PM  Result Value Ref Range   Creatinine, Urine 72 mg/dL  Osmolality, urine     Status: None   Collection Time: 10/02/21  8:37 PM  Result Value Ref Range   Osmolality, Ur 654 300 - 900 mOsm/kg  Sodium, urine, random     Status: None   Collection Time: 10/02/21  8:37 PM  Result Value Ref Range   Sodium, Ur 101 mmol/L  HIV Antibody (routine testing w rflx)     Status: None   Collection Time: 10/02/21  9:23 PM  Result Value Ref  Range   HIV Screen 4th Generation wRfx Non Reactive Non Reactive  Procalcitonin     Status: None   Collection Time: 10/02/21  9:23 PM  Result Value Ref Range   Procalcitonin 43.17 ng/mL  Blood gas, venous     Status: Abnormal   Collection Time: 10/02/21  9:23 PM  Result Value Ref Range   pH, Ven 7.46 (H) 7.25 - 7.43   pCO2, Ven 34 (L) 44 - 60 mmHg   pO2, Ven 61 (H) 32 - 45 mmHg   Bicarbonate 24.2 20.0 - 28.0 mmol/L   Acid-Base Excess 0.5 0.0 - 2.0 mmol/L   O2 Saturation 92.9 %   Patient temperature 37.0   Ammonia     Status: None   Collection Time: 10/02/21  9:23 PM  Result Value Ref Range   Ammonia 14 9 - 35 umol/L  CK     Status: None   Collection Time: 10/02/21  9:23 PM  Result Value Ref Range   Total CK 52 49 - 397 U/L  Magnesium     Status: Abnormal   Collection Time: 10/02/21  9:23 PM  Result Value Ref Range   Magnesium 1.2 (L) 1.7 - 2.4 mg/dL  Osmolality     Status: None   Collection Time: 10/02/21  9:23 PM  Result Value Ref Range   Osmolality 293 275 - 295 mOsm/kg  Phosphorus     Status: Abnormal   Collection Time: 10/02/21  9:23 PM  Result Value Ref Range   Phosphorus 1.8 (L) 2.5 - 4.6 mg/dL  Ethanol     Status: None   Collection Time: 10/02/21  9:23 PM  Result Value Ref Range   Alcohol, Ethyl (B) <10 <10 mg/dL  TSH     Status: Abnormal   Collection Time: 10/02/21  9:26 PM  Result Value Ref Range   TSH 0.172 (L) 0.350 - 4.500 uIU/mL  CBG monitoring, ED     Status: Abnormal   Collection Time: 10/02/21 11:37 PM  Result Value Ref Range   Glucose-Capillary 102 (H) 70 - 99 mg/dL  CBG monitoring, ED     Status: None   Collection Time: 10/03/21  3:44 AM  Result Value Ref Range   Glucose-Capillary 96 70 - 99 mg/dL  Lactic acid, plasma     Status: None   Collection Time: 10/03/21  3:47 AM  Result Value Ref  Range   Lactic Acid, Venous 0.9 0.5 - 1.9 mmol/L  Lactic acid, plasma     Status: None   Collection Time: 10/03/21  5:00 AM  Result Value Ref Range    Lactic Acid, Venous 1.8 0.5 - 1.9 mmol/L  Prealbumin     Status: Abnormal   Collection Time: 10/03/21  5:00 AM  Result Value Ref Range   Prealbumin 8 (L) 18 - 38 mg/dL  Hemoglobin A1c     Status: Abnormal   Collection Time: 10/03/21  5:00 AM  Result Value Ref Range   Hgb A1c MFr Bld 6.0 (H) 4.8 - 5.6 %   Mean Plasma Glucose 125.5 mg/dL  Magnesium     Status: None   Collection Time: 10/03/21  5:00 AM  Result Value Ref Range   Magnesium 1.9 1.7 - 2.4 mg/dL  T4, free     Status: None   Collection Time: 10/03/21  5:00 AM  Result Value Ref Range   Free T4 1.04 0.61 - 1.12 ng/dL  Comprehensive metabolic panel     Status: Abnormal   Collection Time: 10/03/21  5:00 AM  Result Value Ref Range   Sodium 139 135 - 145 mmol/L   Potassium 4.0 3.5 - 5.1 mmol/L   Chloride 107 98 - 111 mmol/L   CO2 25 22 - 32 mmol/L   Glucose, Bld 113 (H) 70 - 99 mg/dL   BUN 18 8 - 23 mg/dL   Creatinine, Ser 0.90 0.61 - 1.24 mg/dL   Calcium 8.3 (L) 8.9 - 10.3 mg/dL   Total Protein 6.6 6.5 - 8.1 g/dL   Albumin 2.2 (L) 3.5 - 5.0 g/dL   AST 21 15 - 41 U/L   ALT 15 0 - 44 U/L   Alkaline Phosphatase 96 38 - 126 U/L   Total Bilirubin 0.4 0.3 - 1.2 mg/dL   GFR, Estimated >60 >60 mL/min   Anion gap 7 5 - 15  CBC     Status: Abnormal   Collection Time: 10/03/21  5:00 AM  Result Value Ref Range   WBC 13.4 (H) 4.0 - 10.5 K/uL   RBC 4.14 (L) 4.22 - 5.81 MIL/uL   Hemoglobin 8.0 (L) 13.0 - 17.0 g/dL   HCT 27.5 (L) 39.0 - 52.0 %   MCV 66.4 (L) 80.0 - 100.0 fL   MCH 19.3 (L) 26.0 - 34.0 pg   MCHC 29.1 (L) 30.0 - 36.0 g/dL   RDW 22.5 (H) 11.5 - 15.5 %   Platelets 324 150 - 400 K/uL   nRBC 0.0 0.0 - 0.2 %  Phosphorus     Status: None   Collection Time: 10/03/21  5:00 AM  Result Value Ref Range   Phosphorus 4.2 2.5 - 4.6 mg/dL  CBG monitoring, ED     Status: None   Collection Time: 10/03/21  8:05 AM  Result Value Ref Range   Glucose-Capillary 91 70 - 99 mg/dL  CBG monitoring, ED     Status: Abnormal    Collection Time: 10/03/21 11:53 AM  Result Value Ref Range   Glucose-Capillary 130 (H) 70 - 99 mg/dL    I have Reviewed nursing notes, Vitals, and Lab results since pt's last encounter. Pertinent lab results : see above I have ordered test including BMP, CBC, Mg I have reviewed the last note from staff over past 24 hours I have discussed pt's care plan and test results with nursing staff, case manager   LOS: 1 day   Dwyane Dee,  MD [facility] 10/03/2021, 1:29 PM

## 2021-10-03 NOTE — Assessment & Plan Note (Signed)
Will replace and recheck 

## 2021-10-03 NOTE — Assessment & Plan Note (Signed)
-   Continue delirium precautions 

## 2021-10-03 NOTE — Assessment & Plan Note (Signed)
-   probable combo from infection and dementia - treating infections

## 2021-10-03 NOTE — Assessment & Plan Note (Signed)
-   some probable aspiration as ativan and dental procedure - continue meropenem

## 2021-10-03 NOTE — Assessment & Plan Note (Signed)
-   RD consult, appreciate assistance - continue encouraging diet

## 2021-10-04 DIAGNOSIS — E0590 Thyrotoxicosis, unspecified without thyrotoxic crisis or storm: Secondary | ICD-10-CM

## 2021-10-04 LAB — BLOOD CULTURE ID PANEL (REFLEXED) - BCID2

## 2021-10-04 LAB — CBC WITH DIFFERENTIAL/PLATELET
Abs Immature Granulocytes: 0.06 10*3/uL (ref 0.00–0.07)
Basophils Absolute: 0 10*3/uL (ref 0.0–0.1)
Basophils Relative: 0 %
Eosinophils Absolute: 0.1 10*3/uL (ref 0.0–0.5)
Eosinophils Relative: 1 %
HCT: 29.8 % — ABNORMAL LOW (ref 39.0–52.0)
Hemoglobin: 8.7 g/dL — ABNORMAL LOW (ref 13.0–17.0)
Immature Granulocytes: 1 %
Lymphocytes Relative: 14 %
Lymphs Abs: 1.3 10*3/uL (ref 0.7–4.0)
MCH: 19.2 pg — ABNORMAL LOW (ref 26.0–34.0)
MCHC: 29.2 g/dL — ABNORMAL LOW (ref 30.0–36.0)
MCV: 65.6 fL — ABNORMAL LOW (ref 80.0–100.0)
Monocytes Absolute: 1 10*3/uL (ref 0.1–1.0)
Monocytes Relative: 11 %
Neutro Abs: 6.8 10*3/uL (ref 1.7–7.7)
Neutrophils Relative %: 73 %
Platelets: 345 10*3/uL (ref 150–400)
RBC: 4.54 MIL/uL (ref 4.22–5.81)
RDW: 22.5 % — ABNORMAL HIGH (ref 11.5–15.5)
WBC: 9.1 10*3/uL (ref 4.0–10.5)
nRBC: 0 % (ref 0.0–0.2)

## 2021-10-04 LAB — GLUCOSE, CAPILLARY
Glucose-Capillary: 89 mg/dL (ref 70–99)
Glucose-Capillary: 86 mg/dL (ref 70–99)
Glucose-Capillary: 108 mg/dL — ABNORMAL HIGH (ref 70–99)
Glucose-Capillary: 101 mg/dL — ABNORMAL HIGH (ref 70–99)

## 2021-10-04 LAB — BASIC METABOLIC PANEL
Anion gap: 6 (ref 5–15)
BUN: 12 mg/dL (ref 8–23)
CO2: 24 mmol/L (ref 22–32)
Calcium: 8.4 mg/dL — ABNORMAL LOW (ref 8.9–10.3)
Chloride: 107 mmol/L (ref 98–111)
Creatinine, Ser: 0.71 mg/dL (ref 0.61–1.24)
GFR, Estimated: >60 mL/min (ref >60)
Glucose, Bld: 80 mg/dL (ref 70–99)
Potassium: 3.7 mmol/L (ref 3.5–5.1)
Sodium: 137 mmol/L (ref 135–145)

## 2021-10-04 LAB — LEGIONELLA PNEUMOPHILA SEROGP 1 UR AG: L. pneumophila Serogp 1 Ur Ag: NEGATIVE

## 2021-10-04 LAB — MAGNESIUM: Magnesium: 1.6 mg/dL — ABNORMAL LOW (ref 1.7–2.4)

## 2021-10-04 MED ORDER — MORPHINE SULFATE (CONCENTRATE) 10 MG/0.5ML PO SOLN: 5.0000 mg | ORAL | Status: DC | PRN

## 2021-10-04 MED ORDER — ACETAMINOPHEN 325 MG PO TABS: 650.0000 mg | ORAL_TABLET | Freq: Four times a day (QID) | ORAL | 0 refills | Status: DC | PRN

## 2021-10-04 MED ORDER — GLYCOPYRROLATE 1 MG PO TABS: 1.0000 mg | ORAL_TABLET | ORAL | Status: DC | PRN

## 2021-10-04 MED ORDER — MAGNESIUM SULFATE 2 GM/50ML IV SOLN
2.0000 g | Freq: Once | INTRAVENOUS | Status: AC
  Administered 2021-10-04: 2 g via INTRAVENOUS
  Filled 2021-10-04: qty 50

## 2021-10-04 MED ORDER — ONDANSETRON 4 MG PO TBDP: 4.0000 mg | ORAL_TABLET | Freq: Four times a day (QID) | ORAL | Status: DC | PRN

## 2021-10-04 MED ORDER — HALOPERIDOL LACTATE 2 MG/ML PO CONC: 0.5000 mg | ORAL | Status: DC | PRN

## 2021-10-04 MED ORDER — ONDANSETRON 4 MG PO TBDP: 4.0000 mg | ORAL_TABLET | Freq: Four times a day (QID) | ORAL | 0 refills | Status: DC | PRN

## 2021-10-04 MED ORDER — LORAZEPAM 1 MG PO TABS: 1.0000 mg | ORAL_TABLET | ORAL | Status: DC | PRN

## 2021-10-04 MED ORDER — GLYCOPYRROLATE 0.2 MG/ML IJ SOLN: 0.2000 mg | INTRAMUSCULAR | Status: DC | PRN

## 2021-10-04 MED ORDER — MUPIROCIN 2 % EX OINT
1.0000 | TOPICAL_OINTMENT | Freq: Two times a day (BID) | CUTANEOUS | Status: DC
  Administered 2021-10-04: 1 via NASAL
  Filled 2021-10-04: qty 22

## 2021-10-04 MED ORDER — MORPHINE SULFATE (CONCENTRATE) 10 MG/0.5ML PO SOLN: 5.0000 mg | ORAL | 0 refills | Status: DC | PRN

## 2021-10-04 MED ORDER — POLYVINYL ALCOHOL 1.4 % OP SOLN: 1.0000 [drp] | Freq: Four times a day (QID) | OPHTHALMIC | Status: DC | PRN

## 2021-10-04 MED ORDER — HALOPERIDOL LACTATE 2 MG/ML PO CONC: 0.5000 mg | ORAL | 0 refills | Status: DC | PRN

## 2021-10-04 MED ORDER — LORAZEPAM 2 MG/ML PO CONC: 1.0000 mg | ORAL | 0 refills | Status: DC | PRN

## 2021-10-04 MED ORDER — ONDANSETRON HCL 4 MG/2ML IJ SOLN: 4.0000 mg | Freq: Four times a day (QID) | INTRAMUSCULAR | Status: DC | PRN

## 2021-10-04 NOTE — NC FL2 (Signed)
[place] LEVEL OF CARE SCREENING TOOL     IDENTIFICATION  Patient Name: Jason [name] Birthdate: 07-12-1945 Sex: male Admission Date (Current Location): 10/02/2021  [facility] and [place] Number:  Herbalist and Address:         Provider Number: [id number]  Attending Physician Name and Address:  Kerney Elbe, DO  Relative Name and Phone Number:  Legal guardian: [name] [phone]    Current Level of Care: Hospital Recommended Level of Care: [place], Other (Comment) (long term care patient) Prior Approval Number:    Date Approved/Denied:   PASRR Number: [id number]  Discharge Plan: SNF    Current Diagnoses: Patient Active Problem List   Diagnosis Date Noted   Hypomagnesemia 10/03/2021   Hypophosphatemia 10/03/2021   Subclinical hyperthyroidism 10/03/2021   Advanced care planning/counseling discussion    Goals of care, counseling/discussion    Severe sepsis ([place]) 00/76/2263   Acute metabolic encephalopathy 33/54/5625   UTI (urinary tract infection) 10/02/2021   DM2 (diabetes mellitus, type 2) ([place]) 10/02/2021   Palliative care status 03/17/2021   Comfort measures only status 03/17/2021   Anemia 03/17/2021   Atrial fibrillation, transient ([place]) 03/17/2021   Aspiration into respiratory tract    Malnutrition of moderate degree 03/03/2021   Incarcerated hernia    Pressure injury of skin 01/30/2018   Small bowel obstruction ([place]) 01/27/2018   Inguinal hernia 01/27/2018   Acute esophagitis 01/27/2018   Hematemesis 01/27/2018   Pneumonia 12/15/2015   Acute encephalopathy 03/07/2013   Postoperative intra-abdominal abscess 03/07/2013   Hydronephrosis, bilateral 03/07/2013   Pressure ulcer, stage 3 ([place]) 03/07/2013   Septic shock ([place]) 02/18/2013   Aspiration pneumonia ([place]) 02/18/2013   Acute hypoxemic respiratory failure ([place]) 02/18/2013   AKI (acute kidney injury) ([place]) 02/18/2013   Dementia ([place]) 02/18/2013    Hematuria 02/18/2013   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   Memory loss 03/27/2009   CELLULITIS, HAND, LEFT 03/23/2009   FATIGUE 03/15/2009   ALCOHOL ABUSE 01/30/2008   GOITER 10/25/2006   NICOTINE ADDICTION 10/25/2006   HYPERTENSION 10/25/2006   COLONIC POLYPS, HX OF 10/25/2006   BENIGN PROSTATIC HYPERTROPHY, HX OF 10/25/2006    Orientation RESPIRATION BLADDER Height & Weight     Self, Situation  Normal Continent Weight: 74 kg Height:  '5\' 8"'$  (172.7 cm)  BEHAVIORAL SYMPTOMS/MOOD NEUROLOGICAL BOWEL NUTRITION STATUS      Continent Diet (dysphagia 3)  AMBULATORY STATUS COMMUNICATION OF NEEDS Skin   Extensive Assist Verbally Normal                       Personal Care Assistance Level of Assistance  Bathing, Feeding, Dressing Bathing Assistance: Independent Feeding assistance: Limited assistance Dressing Assistance: Limited assistance     Functional Limitations Info  Sight, Hearing, Speech Sight Info: Adequate Hearing Info: Adequate Speech Info: Adequate    SPECIAL CARE FACTORS FREQUENCY   (long term care patient with [facility])                    Contractures Contractures Info: Not present    Additional Factors Info  Code Status, Allergies Code Status Info: DNR Allergies Info: No Known Allergies           Current Medications (10/04/2021):  This is the current hospital active medication list Current Facility-Administered Medications  Medication Dose Route Frequency Provider Last Rate Last Admin   acetaminophen (TYLENOL) tablet 650 mg  650 mg Oral Q6H PRN Doutova,  Nyoka Lint, MD       Or   acetaminophen (TYLENOL) suppository 650 mg  650 mg Rectal Q6H PRN Doutova, Anastassia, MD       albuterol (PROVENTIL) (2.5 MG/3ML) 0.083% nebulizer solution 2.5 mg  2.5 mg Nebulization Q2H PRN Doutova, Anastassia, MD       Chlorhexidine Gluconate Cloth 2 % PADS 6 each  6 each Topical Q0600 Doutova, Anastassia, MD   6 each at 10/03/21 2316    glycopyrrolate (ROBINUL) tablet 1 mg  1 mg Oral Q4H PRN Earlie Counts, NP       Or   glycopyrrolate (ROBINUL) injection 0.2 mg  0.2 mg Subcutaneous Q4H PRN Earlie Counts, NP       Or   glycopyrrolate (ROBINUL) injection 0.2 mg  0.2 mg Intravenous Q4H PRN Earlie Counts, NP       haloperidol (HALDOL) 2 MG/ML solution 0.5 mg  0.5 mg Oral Q4H PRN Earlie Counts, NP       LORazepam (ATIVAN) tablet 1 mg  1 mg Oral Q4H PRN Earlie Counts, NP       morphine CONCENTRATE 10 MG/0.5ML oral solution 5 mg  5 mg Oral Q1H PRN Earlie Counts, NP       ondansetron (ZOFRAN-ODT) disintegrating tablet 4 mg  4 mg Oral Q6H PRN Earlie Counts, NP       Or   ondansetron (ZOFRAN) injection 4 mg  4 mg Intravenous Q6H PRN Earlie Counts, NP       polyvinyl alcohol (LIQUIFILM TEARS) 1.4 % ophthalmic solution 1 drop  1 drop Both Eyes QID PRN Earlie Counts, NP         Discharge Medications: Please see discharge summary for a list of discharge medications.  Relevant Imaging Results:  Relevant Lab Results:   Additional Information SSN: [id number]  [name], RN

## 2021-10-04 NOTE — Progress Notes (Addendum)
Daily Progress Note   Patient Name: Jason [name]       Date: 10/04/2021 DOB: 1945/07/08  Age: 76 y.o. MRN#: [id number] Attending Physician: Jason Elbe, DO Primary Care Physician: Jason Adie, MD Admit Date: 10/02/2021  Reason for Consultation/Follow-up: Establishing goals of care  Patient Profile/HPI:  76 y.o. male  with past medical history of dementia, A-fib, dysphagia, bladder cancer, admitted on 10/02/2021 with sepsis related to aspiration pneumonia, and UTI.  Palliative medicine consulted for goals of care.  Subjective:  Patient resting in bed.  MOST form received from [facility] indicating: DNR, comfort measures, no IV abx, no IV fluids, no feeding tube. However, am awaiting confirmation from appropriate legal decision maker.  Received call from [place], Jason [name] at [facility]. [facility] agrees that patient should be comfort and discharged back to facility, however, they have not had contact with patient's legal guardian who is the decision  maker for patient.  I called Jason [name] and DSS again today and left message requesting return call from [name].  Review of Systems  Unable to perform ROS: Dementia     Physical Exam Vitals and nursing note reviewed.  Constitutional:      Comments: Frail, cachectic  Pulmonary:     Effort: Pulmonary effort is normal.  Neurological:     Comments: sleeping             Vital Signs: BP (!) 112/55   Pulse 75   Temp (!) 97.4 F (36.3 C) (Oral)   Resp (!) 21   Ht '5\' 8"'$  (1.727 m)   Wt 74 kg   SpO2 98%   BMI 24.81 kg/m  SpO2: SpO2: 98 % O2 Device: O2 Device: Room Air O2 Flow Rate: O2 Flow Rate (L/min): 2 L/min  Intake/output summary:  Intake/Output Summary (Last 24 hours) at 10/04/2021 1141 Last data filed at 10/04/2021 1014 Gross per  24 hour  Intake 984.66 ml  Output 700 ml  Net 284.66 ml   LBM: Last BM Date : 10/04/21 Baseline Weight: Weight: 74 kg Most recent weight: Weight: 74 kg    Patient Active Problem List   Diagnosis Date Noted   Hypomagnesemia 10/03/2021   Hypophosphatemia 10/03/2021   Subclinical hyperthyroidism 10/03/2021   Advanced care planning/counseling discussion    Goals of care, counseling/discussion    Severe  sepsis ([place]) 75/64/3329   Acute metabolic encephalopathy 51/88/4166   UTI (urinary tract infection) 10/02/2021   DM2 (diabetes mellitus, type 2) ([place]) 10/02/2021   Palliative care status 03/17/2021   Comfort measures only status 03/17/2021   Anemia 03/17/2021   Atrial fibrillation, transient ([place]) 03/17/2021   Aspiration into respiratory tract    Malnutrition of moderate degree 03/03/2021   Incarcerated hernia    Pressure injury of skin 01/30/2018   Small bowel obstruction ([place]) 01/27/2018   Inguinal hernia 01/27/2018   Acute esophagitis 01/27/2018   Hematemesis 01/27/2018   Pneumonia 12/15/2015   Acute encephalopathy 03/07/2013   Postoperative intra-abdominal abscess 03/07/2013   Hydronephrosis, bilateral 03/07/2013   Pressure ulcer, stage 3 ([place]) 03/07/2013   Septic shock ([place]) 02/18/2013   Aspiration pneumonia ([place]) 02/18/2013   Acute hypoxemic respiratory failure ([place]) 02/18/2013   AKI (acute kidney injury) ([place]) 02/18/2013   Dementia ([place]) 02/18/2013   Hematuria 02/18/2013   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 She confirmed that patient is comfort measures only and should be discharged back to previous facility.   Recommend he be discharged with following medications for comfort.  On discharge, would recommend scripts for: - Morphine Concentrate '10mg'$ /0.65m: '5mg'$  (0.251m sublingual every 1 hour as needed for pain or shortness of breath: Disp 3076m Lorazepam '2mg'$ /ml concentrated solution: '1mg'$  (0.5ml33mublingual every 4 hours as needed for anxiety: Disp 30ml41maldol '2mg'$ /ml solution: 0.'5mg'$  (0.25ml)56mlingual every 4 hours as needed for agitation or nausea: Disp 30ml  7mode Status: DNR  Prognosis:  < 2 weeks due to sepsis, pneumonia, plan for comfort measures only  [facility]  Care plan was discussed with patient's legal guardian and care team.   Thank you for allowing the Palliative Medicine Team to assist in the care of this patient.  Total time: 80 minutes   Greater than 50%  of this time was spent counseling and coordinating care related to the above assessment and plan.  Corrion Stirewalt MMariana KaufmanC Palliative Medicine   Please contact Palliative Medicine Team phone at [phone] and concerns.

## 2021-10-04 NOTE — TOC Transition Note (Addendum)
Transition of Care [facility]) - CM/SW Discharge Note   Patient Details  Name: Jason [name] MRN: [id number] Date of Birth: 10-31-1945  Transition of Care [facility]) CM/SW Contact:  [name], RN Phone Number: 10/04/2021, 2:51 PM   Clinical Narrative:   Spoke with Leane Call with [facility] who reports [facility] transportation will be her to pick patient up today between 5p-6p.   No additional TOC needs at this time, will follow continue to follow.  - 4:15pm MAR faxed to [facility] and sent via e-fax and SNF HUB. Notified RN, MD that [facility] will pick patient up at 4:30pm .  No additional TOC needs at this time   Final next level of care: [place] Barriers to Discharge: Continued Medical Work up   Patient Goals and CMS Choice Patient states their goals for this hospitalization and ongoing recovery are:: return to [facility]   Choice offered to / list presented to : NA  Discharge Placement                       Discharge Plan and Services In-house Referral: NA Discharge Planning Services: CM Consult Post Acute Care Choice: NA          DME Arranged: N/A DME Agency: NA       HH Arranged: NA HH Agency: NA        Social Determinants of Health (SDOH) Interventions     Readmission Risk Interventions     No data to display

## 2021-10-04 NOTE — TOC Initial Note (Signed)
Transition of Care [facility]) - Initial/Assessment Note    Patient Details  Name: Jason [name] MRN: [id number] Date of Birth: 09/29/1945  Transition of Care [facility]) CM/SW Contact:    [name], RN Phone Number: 10/04/2021, 1:12 PM  Clinical Narrative:     Received TOC consult for admitted from any facility.  TOC will continue to follow              Expected Discharge Plan: [place] Barriers to Discharge: Continued Medical Work up   Patient Goals and CMS Choice Patient states their goals for this hospitalization and ongoing recovery are:: return to [facility]   Choice offered to / list presented to : NA  Expected Discharge Plan and Services Expected Discharge Plan: [place] In-house Referral: NA Discharge Planning Services: CM Consult Post Acute Care Choice: NA Living arrangements for the past 2 months: [place] Expected Discharge Date: 10/04/21               DME Arranged: N/A DME Agency: NA       HH Arranged: NA HH Agency: NA        Prior Living Arrangements/Services Living arrangements for the past 2 months: [place] Lives with:: Facility Resident Patient language and need for interpreter reviewed:: Yes Do you feel safe going back to the place where you live?: Yes      Need for Family Participation in Patient Care: Yes (Comment) Care giver support system in place?: No (comment) Current home services: Other (comment) (n/a) Criminal Activity/Legal Involvement Pertinent to Current Situation/Hospitalization: No - Comment as needed  Activities of Daily Living Home Assistive Devices/Equipment: Other (Comment) (Pt is from [facility]. He has equipment available there but unable to verbalize what equipment he uses.) ADL Screening (condition at time of admission) Patient's cognitive ability adequate to safely complete daily activities?: No Is the patient deaf or have difficulty hearing?:  (unknown, pt  unable to answer) Does the patient have difficulty seeing, even when wearing glasses/contacts?:  (unknown, pt unable to answer) Does the patient have difficulty concentrating, remembering, or making decisions?: Yes Patient able to express need for assistance with ADLs?: No Does the patient have difficulty dressing or bathing?: Yes Independently performs ADLs?: No Communication: Dependent Is this a change from baseline?: Pre-admission baseline Dressing (OT): Needs assistance Is this a change from baseline?: Pre-admission baseline Grooming: Needs assistance Is this a change from baseline?: Pre-admission baseline Feeding: Needs assistance Is this a change from baseline?: Pre-admission baseline Bathing: Needs assistance Is this a change from baseline?: Pre-admission baseline Toileting: Needs assistance Is this a change from baseline?: Pre-admission baseline In/Out Bed: Needs assistance Is this a change from baseline?: Pre-admission baseline Walks in Home: Needs assistance Is this a change from baseline?: Pre-admission baseline Does the patient have difficulty walking or climbing stairs?: Yes Weakness of Legs: Both Weakness of Arms/Hands: Both  Permission Sought/Granted Permission sought to share information with : Case Manager Permission granted to share information with : Yes, Verbal Permission Granted  Share Information with NAME: Case manager , legal guardian: [name]           Emotional Assessment Appearance:: Appears stated age Attitude/Demeanor/Rapport: Unable to Assess Affect (typically observed): Unable to Assess   Alcohol / Substance Use: Not Applicable Psych Involvement: No (comment)  Admission diagnosis:  Severe sepsis (HCC) [A41.9, R65.20] Pneumonia of right lower lobe due to infectious organism [J18.9] Sepsis, due to unspecified organism, unspecified whether acute organ dysfunction present [facility]) [  A41.9] Patient Active Problem List   Diagnosis Date Noted    Hypomagnesemia 10/03/2021   Hypophosphatemia 10/03/2021   Subclinical hyperthyroidism 10/03/2021   Advanced care planning/counseling discussion    Goals of care, counseling/discussion    Severe sepsis ([place]) 02/40/9735   Acute metabolic encephalopathy 32/99/2426   UTI (urinary tract infection) 10/02/2021   DM2 (diabetes mellitus, type 2) ([place]) 10/02/2021   Palliative care status 03/17/2021   Comfort measures only status 03/17/2021   Anemia 03/17/2021   Atrial fibrillation, transient ([place]) 03/17/2021   Aspiration into respiratory tract    Malnutrition of moderate degree 03/03/2021   Incarcerated hernia    Pressure injury of skin 01/30/2018   Small bowel obstruction ([place]) 01/27/2018   Inguinal hernia 01/27/2018   Acute esophagitis 01/27/2018   Hematemesis 01/27/2018   Pneumonia 12/15/2015   Acute encephalopathy 03/07/2013   Postoperative intra-abdominal abscess 03/07/2013   Hydronephrosis, bilateral 03/07/2013   Pressure ulcer, stage 3 ([place]) 03/07/2013   Septic shock ([place]) 02/18/2013   Aspiration pneumonia ([place]) 02/18/2013   Acute hypoxemic respiratory failure ([place]) 02/18/2013   AKI (acute kidney injury) ([place]) 02/18/2013   Dementia ([place]) 02/18/2013   Hematuria 02/18/2013   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

## 2021-10-04 NOTE — TOC Transition Note (Addendum)
Transition of Care [facility]) - CM/SW Discharge Note   Patient Details  Name: Jason [name] MRN: [id number] Date of Birth: 01/11/1945  Transition of Care [facility]) CM/SW Contact:  [name], RN Phone Number: 10/04/2021, 1:14 PM   Clinical Narrative:   Spoke with [name] with [name], who reports patient is LTC patient at [facility] and can return back to facility and facility prefers printed prescriptions at discharge. Report can be called at [phone], [name] room # 102 bed A. Notified MD , RN  Left message with [name] with [facility] for transportation back [facility] today, awaiting call back.   Spoke with patient's legal guardian Jason [name] who is aware of comfort care measures and patient will be transitioning back to [facility] today, awaiting transportation with [facility].  TOC will continue to follow.  - 1:24p Spoke with [name] at [facility] for transportation to [facility], she will set up and give this RNCM a call back.    Final next level of care: [place] Barriers to Discharge: Continued Medical Work up   Patient Goals and CMS Choice Patient states their goals for this hospitalization and ongoing recovery are:: return to [facility]   Choice offered to / list presented to : NA  Discharge Placement                       Discharge Plan and Services In-house Referral: NA Discharge Planning Services: CM Consult Post Acute Care Choice: NA          DME Arranged: N/A DME Agency: NA       HH Arranged: NA HH Agency: NA        Social Determinants of Health (SDOH) Interventions     Readmission Risk Interventions     No data to display

## 2021-10-04 NOTE — Progress Notes (Signed)
Initial Nutrition Assessment  DOCUMENTATION CODES:  Non-severe (moderate) malnutrition in context of chronic illness  INTERVENTION:  -Continue diet per SLP recommendations -Provide Ensure Plus HP BID (350kcal, 20g protein)  NUTRITION DIAGNOSIS:  Moderate Malnutrition related to chronic illness (dementia) as evidenced by moderate fat depletion, moderate muscle depletion.  GOAL:  Patient will meet greater than or equal to 90% of their needs  MONITOR:  PO intake, Supplement acceptance  REASON FOR ASSESSMENT:  Consult Assessment of nutrition requirement/status  ASSESSMENT:  Pt is a 77yo M with PMH of dementia, alcohol use, afib, bladder cancer, COPD, HTN and T2DM who presents with AMS. Found to have sepsis 2/2 aspiration pneumonia.  Assessed by SLP yesterday who recommends a dysphagia 3 diet with thin liquids. RD consulted for nutrition assessment.   Visited pt at bedside this AM. He is only able to answer yes or no questions with questionable accuracy. Pt reports a good appetite. No documented weight loss noted in EMR. Physical exam reveals moderate fat loss and moderate to severe muscle wasting. Pt meets criteria for moderate protein calorie malnutrition. Recommend providing Ensure Plus HP BID to ensure adequate protein and calorie intake. ONS will provide 350kcal and 20g protein/bottle.    Medications reviewed and include: novolog, magnesium sulfate  Labs reviewed: Mg:1.6  NUTRITION - FOCUSED PHYSICAL EXAM:  Flowsheet Row Most Recent Value  Orbital Region Moderate depletion  Upper Arm Region Severe depletion  Thoracic and Lumbar Region Moderate depletion  Buccal Region Moderate depletion  Temple Region Severe depletion  Clavicle Bone Region Severe depletion  Clavicle and Acromion Bone Region Severe depletion  Scapular Bone Region Unable to assess  Dorsal Hand Moderate depletion  Patellar Region Moderate depletion  Anterior Thigh Region Mild depletion  Posterior Calf  Region Moderate depletion  Hair Reviewed  Eyes Reviewed  Mouth Reviewed  Skin Reviewed  Nails Reviewed      Diet Order:   Diet Order             DIET DYS 3 Fluid consistency: Thin  Diet effective now                   EDUCATION NEEDS:  Not appropriate for education at this time  Skin:  Skin Assessment: Reviewed RN Assessment  Last BM:  10/04/21  Height:  Ht Readings from Last 1 Encounters:  10/02/21 '5\' 8"'$  (1.727 m)   Weight:  Wt Readings from Last 1 Encounters:  10/02/21 74 kg   BMI:  Body mass index is 24.81 kg/m.  Estimated Nutritional Needs:   Kcal:  1850-2220kcal  Protein:  85-110g  Fluid:  1850-2228m  KCandise Bowens MS, RD, LDN, CNSC See AMiON for contact information

## 2021-10-04 NOTE — Discharge Summary (Addendum)
Physician Discharge Summary   Patient: Jason [name] MRN: [id number] DOB: [birth year]  Admit date:     10/02/2021  Discharge date: 10/04/21  Discharge Physician: Raiford Noble, DO   PCP: Janifer Adie, MD   Recommendations at discharge:   Further care per [facility] protocol  Discharge Diagnoses: Principal Problem:   Severe sepsis [facility]) Active Problems:   Aspiration pneumonia ([place])   Acute hypoxemic respiratory failure ([place])   UTI (urinary tract infection)   Dementia ([place])   Acute metabolic encephalopathy   Subclinical hyperthyroidism   Malnutrition of moderate degree   DM2 (diabetes mellitus, type 2) ([place])   Hypomagnesemia   Hypophosphatemia  Resolved Problems:   * No resolved hospital problems. [facility] Course: Jason [name] is a 76 yo male with PMH dementia, alcohold use, afib, bladder cancer, DM II who presented with altered mentation.  He resides at [facility] and was given Ativan for a dental procedure then was found to be lethargic and altered. CXR showed right basilar opacity concerning for possible aspiration.  CT head was unremarkable. He was started on Vanco and meropenem to cover for pneumonia and history of ESBL UTI.  He was admitted for further work-up and monitoring.  Palliative care was consulted and the MOST form was received from patient indicating the patient was a DNR and comfort measures with no antibiotics and no IV fluids as well as no feeding tube.  His legal guardian has confirmed the patient comfort measures only and should be discharged back to the facility.  We will discontinue all his antibiotics and place him on comfort measures medications including morphine concentrate, lorazepam and Haldol with further care per hospice protocol.  Patient is stable for discharge at this time back to his facility with hospice  Assessment and Plan: * Severe sepsis (HCC) - febrile, tachycardia, tachypnea, lactic acidosis; suspected PNA and UTI -WBC went  from 13.4 now 9.1 - continue vanc and meropenem while he is hospitalized but after further evaluation and review of his MOST form and discussion with palliative he should have never been transferred to the hospital and was mistakenly transferred.  Patient is currently comfort measures only - follow up cultures  Aspiration pneumonia ([place]) - some probable aspiration as ativan and dental procedure - continued meropenem but now discontinued  UTI (urinary tract infection) - See severe sepsis and have now discontinued antibiotics  Acute hypoxemic respiratory failure ([place]) - Suspected due to aspiration and possibly from some sedation - Continue weaning oxygen as able -Patient is comfort measures only  Acute metabolic encephalopathy superimposed on chronic dementia - probable combo from infection and dementia - treating infections but patient is comfort measures only and will discontinue all his other medications except the ones for his comfort  Dementia ([place]) - Continue delirium precautions  Subclinical hyperthyroidism - History of similar - TSH 0.172 on admission - Free T4, 1.04  Hypophosphatemia - Replete as needed  Hypomagnesemia - Replete as needed and last mag level was 1.6 and repleted prior to discharge  DM2 (diabetes mellitus, type 2) ([place]) - continue SSI and CBG monitoring; CBGs ranging from 76-108  Malnutrition of moderate degree - RD consult, appreciate assistance - continue encouraging diet and they are recommending Ensure plus HP twice daily  Nutrition Documentation    Flowsheet Row ED to Hosp-Admission (Current) from 10/02/2021 in [facility]-ICU/STEPDOWN  Nutrition Problem Moderate Malnutrition  Etiology chronic illness  [dementia]  Nutrition Goal Patient will meet greater than or equal  to 90% of their needs  Interventions Ensure Enlive (each supplement provides 350kcal and 20 grams of protein)      Consultants: Palliative care  medicine Procedures performed: As delineated as above Disposition:  SNF with hospice Diet recommendation:  Discharge Diet Orders (From admission, onward)     Start     Ordered   10/04/21 0000  Diet - low sodium heart healthy       Comments: Dysphagia 3 Diet   10/04/21 1235           Dysphagia type 3 thin Liquid  DISCHARGE MEDICATION: Allergies as of 10/04/2021   No Known Allergies      Medication List     STOP taking these medications    acetaminophen 650 MG suppository Commonly known as: TYLENOL Replaced by: acetaminophen 325 MG tablet   antiseptic oral rinse Liqd   chlorhexidine gluconate (MEDLINE KIT) 0.12 % solution Commonly known as: PERIDEX   Desitin 40 % Pste Generic drug: Zinc Oxide   famotidine 20 MG tablet Commonly known as: PEPCID   Gerhardt's butt cream Crea   glycopyrrolate 0.2 MG/ML injection Commonly known as: ROBINUL   guaiFENesin 100 MG/5ML liquid Commonly known as: ROBITUSSIN   hydrALAZINE 20 MG/ML injection Commonly known as: APRESOLINE   ipratropium-albuterol 0.5-2.5 (3) MG/3ML Soln Commonly known as: DUONEB   LORazepam in dextrose solution   magnesium hydroxide 400 MG/5ML suspension Commonly known as: MILK OF MAGNESIA   morphine (PF) 2 MG/ML injection Replaced by: morphine CONCENTRATE 10 MG/0.5ML Soln concentrated solution   mouth rinse Liqd solution   polyvinyl alcohol 1.4 % ophthalmic solution Commonly known as: LIQUIFILM TEARS   Resource 2.0 Liqd   sertraline 25 MG tablet Commonly known as: ZOLOFT   vitamin A & D ointment       TAKE these medications    acetaminophen 325 MG tablet Commonly known as: TYLENOL Take 2 tablets (650 mg total) by mouth every 6 (six) hours as needed for mild pain (or Fever >/= 101). Replaces: acetaminophen 650 MG suppository   haloperidol 2 MG/ML solution Commonly known as: HALDOL Take 0.3 mLs (0.6 mg total) by mouth every 4 (four) hours as needed for agitation. What changed:   how much to take how to take this reasons to take this   LORazepam 2 MG/ML concentrated solution Commonly known as: ATIVAN Place 0.5 mLs (1 mg total) under the tongue every 4 (four) hours as needed for anxiety.   morphine CONCENTRATE 10 MG/0.5ML Soln concentrated solution Take 0.25 mLs (5 mg total) by mouth every hour as needed for shortness of breath or moderate pain. Replaces: morphine (PF) 2 MG/ML injection   ondansetron 4 MG disintegrating tablet Commonly known as: ZOFRAN-ODT Take 1 tablet (4 mg total) by mouth every 6 (six) hours as needed for nausea.        Discharge Exam: Filed Weights   10/02/21 1624  Weight: 74 kg   Vitals:   10/04/21 1000 10/04/21 1230  BP: (!) 112/55   Pulse:    Resp: (!) 21   Temp:  97.8 F (36.6 C)  SpO2:     Examination: Physical Exam:  Constitutional: Thin African-American male in no acute distress appears calm but slightly confused Respiratory: Diminished to auscultation bilaterally with coarse breath sounds, no wheezing, rales, rhonchi or crackles. Normal respiratory effort and patient is not tachypenic. No accessory muscle use.  Unlabored breathing Cardiovascular: RRR, no murmurs / rubs / gallops. S1 and S2 auscultated. No extremity edema. 2+ pedal  pulses. No carotid bruits.  Abdomen: Soft, non-tender, non-distended. Bowel sounds positive.  GU: Deferred. Musculoskeletal: No clubbing / cyanosis of digits/nails. No joint deformity upper and lower extremities. Neurologic: CN 2-12 grossly intact with no focal deficits.   Psychiatric: Impaired judgment and insight  Condition at discharge:  Stable but Guarded  The results of significant diagnostics from this hospitalization (including imaging, microbiology, ancillary and laboratory) are listed below for reference.   Imaging Studies: CT HEAD WO CONTRAST (5MM)  Result Date: 10/02/2021 CLINICAL DATA:  Altered mental status. EXAM: CT HEAD WITHOUT CONTRAST TECHNIQUE: Contiguous axial  images were obtained from the base of the skull through the vertex without intravenous contrast. RADIATION DOSE REDUCTION: This exam was performed according to the departmental dose-optimization program which includes automated exposure control, adjustment of the mA and/or kV according to patient size and/or use of iterative reconstruction technique. COMPARISON:  December 13, 2015. 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 No mass effect or midline shift is noted. Ventricular size is within normal limits. There is no evidence of mass lesion, hemorrhage or acute infarction. Vascular: No hyperdense vessel or unexpected calcification. Skull: Normal. Negative for fracture or focal lesion. Sinuses/Orbits: No acute finding. Other: None. IMPRESSION: No acute intracranial abnormality seen. Electronically Signed   By: Marijo Conception M.D.   On: 10/02/2021 20:23   DG Chest Port 1 View  Result Date: 10/02/2021 CLINICAL DATA:  Fever. EXAM: PORTABLE CHEST 1 VIEW COMPARISON:  March 16, 2021. FINDINGS: The heart size and mediastinal contours are within normal limits. Grossly stable bilateral upper lobe reticular opacities are noted most consistent with scarring. Increased right basilar atelectasis or infiltrate is noted with small right pleural effusion. The visualized skeletal structures are unremarkable. IMPRESSION: Right basilar opacity is noted concerning for right basilar atelectasis or infiltrate with associated small pleural effusion. Electronically Signed   By: Marijo Conception M.D.   On: 10/02/2021 17:55    Microbiology: Results for orders placed or performed during the hospital encounter of 10/02/21  Resp Panel by RT-PCR (Flu A&B, Covid) Anterior Nasal Swab     Status: None   Collection Time: 10/02/21  4:35 PM   Specimen: Anterior Nasal Swab  Result Value Ref Range Status   SARS Coronavirus 2 by RT PCR NEGATIVE NEGATIVE Final    Comment: (NOTE) SARS-CoV-2 target nucleic acids  are NOT DETECTED.  The SARS-CoV-2 RNA is generally detectable in upper respiratory specimens during the acute phase of infection. The lowest concentration of SARS-CoV-2 viral copies this assay can detect is 138 copies/mL. A negative result does not preclude SARS-Cov-2 infection and should not be used as the sole basis for treatment or other patient management decisions. A negative result may occur with  improper specimen collection/handling, submission of specimen other than nasopharyngeal swab, presence of viral mutation(s) within the areas targeted by this assay, and inadequate number of viral copies(<138 copies/mL). A negative result must be combined with clinical observations, patient history, and epidemiological information. The expected result is Negative.  Fact Sheet for Patients:  EntrepreneurPulse.com.au  Fact Sheet for Healthcare Providers:  IncredibleEmployment.be  This test is no t yet approved or cleared by the [place] FDA and  has been authorized for detection and/or diagnosis of SARS-CoV-2 by FDA under an Emergency Use Authorization (EUA). This EUA will remain  in effect (meaning this test can be used) for the duration of the COVID-19 declaration under Section 564(b)(1) of the Act, 21 U.S.C.section 360bbb-3(b)(1), unless the authorization  is terminated  or revoked sooner.       Influenza A by PCR NEGATIVE NEGATIVE Final   Influenza B by PCR NEGATIVE NEGATIVE Final    Comment: (NOTE) The Xpert Xpress SARS-CoV-2/FLU/RSV plus assay is intended as an aid in the diagnosis of influenza from Nasopharyngeal swab specimens and should not be used as a sole basis for treatment. Nasal washings and aspirates are unacceptable for Xpert Xpress SARS-CoV-2/FLU/RSV testing.  Fact Sheet for Patients: EntrepreneurPulse.com.au  Fact Sheet for Healthcare Providers: IncredibleEmployment.be  This test is  not yet approved or cleared by the [place] FDA and has been authorized for detection and/or diagnosis of SARS-CoV-2 by FDA under an Emergency Use Authorization (EUA). This EUA will remain in effect (meaning this test can be used) for the duration of the COVID-19 declaration under Section 564(b)(1) of the Act, 21 U.S.C. section 360bbb-3(b)(1), unless the authorization is terminated or revoked.  Performed at [facility], [place] [address], [place], [place] [address]   Blood Culture (routine x 2)     Status: None (Preliminary result)   Collection Time: 10/02/21  4:35 PM   Specimen: BLOOD  Result Value Ref Range Status   Specimen Description   Final    BLOOD LEFT ANTECUBITAL Performed at [facility] [address], [place], [place] [address]    Special Requests   Final    BOTTLES DRAWN AEROBIC AND ANAEROBIC Blood Culture adequate volume Performed at [facility] [address] [place], [place] [address]    Culture  Setup Time   Final    GRAM POSITIVE COCCI IN CLUSTERS AEROBIC BOTTLE ONLY Organism ID to follow CRITICAL RESULT CALLED TO, READ BACK BY AND VERIFIED WITH: PHARMD TERRY GREEN 10/04/21 _0  BY AB Performed at [facility], [place] [address] [place], [place] [address]    Culture GRAM POSITIVE COCCI  Final   Report Status PENDING  Incomplete  Blood Culture ID Panel (Reflexed)     Status: Abnormal   Collection Time: 10/02/21  4:35 PM  Result Value Ref Range Status   Enterococcus faecalis NOT DETECTED NOT DETECTED Final   Enterococcus Faecium NOT DETECTED NOT DETECTED Final   Listeria monocytogenes NOT DETECTED NOT DETECTED Final   Staphylococcus species DETECTED (A) NOT DETECTED Final    Comment: CRITICAL RESULT CALLED TO, READ BACK BY AND VERIFIED WITH: PHARMD TERRY GREEN 10/04/21 _1  BY AB    Staphylococcus aureus (BCID) NOT DETECTED NOT DETECTED Final   Staphylococcus epidermidis NOT DETECTED NOT  DETECTED Final   Staphylococcus lugdunensis NOT DETECTED NOT DETECTED Final   Streptococcus species NOT DETECTED NOT DETECTED Final   Streptococcus agalactiae NOT DETECTED NOT DETECTED Final   Streptococcus pneumoniae NOT DETECTED NOT DETECTED Final   Streptococcus pyogenes NOT DETECTED NOT DETECTED Final   A.calcoaceticus-baumannii NOT DETECTED NOT DETECTED Final   Bacteroides fragilis NOT DETECTED NOT DETECTED Final   Enterobacterales NOT DETECTED NOT DETECTED Final   Enterobacter cloacae complex NOT DETECTED NOT DETECTED Final   Escherichia coli NOT DETECTED NOT DETECTED Final   Klebsiella aerogenes NOT DETECTED NOT DETECTED Final   Klebsiella oxytoca NOT DETECTED NOT DETECTED Final   Klebsiella pneumoniae NOT DETECTED NOT DETECTED Final   Proteus species NOT DETECTED NOT DETECTED Final   Salmonella species NOT DETECTED NOT DETECTED Final   Serratia marcescens NOT DETECTED NOT DETECTED Final   Haemophilus influenzae NOT DETECTED NOT DETECTED Final   Neisseria meningitidis NOT DETECTED NOT DETECTED Final  Pseudomonas aeruginosa NOT DETECTED NOT DETECTED Final   Stenotrophomonas maltophilia NOT DETECTED NOT DETECTED Final   Candida albicans NOT DETECTED NOT DETECTED Final   Candida auris NOT DETECTED NOT DETECTED Final   Candida glabrata NOT DETECTED NOT DETECTED Final   Candida krusei NOT DETECTED NOT DETECTED Final   Candida parapsilosis NOT DETECTED NOT DETECTED Final   Candida tropicalis NOT DETECTED NOT DETECTED Final   Cryptococcus neoformans/gattii NOT DETECTED NOT DETECTED Final    Comment: Performed at [facility], [place] [address] [place], [place] [address]  Blood Culture (routine x 2)     Status: None (Preliminary result)   Collection Time: 10/02/21  4:54 PM   Specimen: BLOOD  Result Value Ref Range Status   Specimen Description   Final    BLOOD BLOOD RIGHT FOREARM Performed at [facility] [address], [place], [place] [address]     Special Requests   Final    BOTTLES DRAWN AEROBIC AND ANAEROBIC Blood Culture adequate volume Performed at [facility] [address] [place], [place] [address]    Culture   Final    NO GROWTH 2 DAYS Performed at [facility] [address] [place], [place] [address]    Report Status PENDING  Incomplete  Urine Culture     Status: Abnormal (Preliminary result)   Collection Time: 10/02/21  6:21 PM   Specimen: In/Out Cath Urine  Result Value Ref Range Status   Specimen Description   Final    IN/OUT CATH URINE Performed at [facility] [address] [place], [place] [address]    Special Requests   Final    NONE Performed at [facility], [place] [address], [place], [place] [address]    Culture (A)  Final    >=100,000 COLONIES/mL PROTEUS MIRABILIS SUSCEPTIBILITIES TO FOLLOW Performed at [facility], [place] [address], [place], [place] [address]    Report Status PENDING  Incomplete  MRSA Next Gen by PCR, Nasal     Status: Abnormal   Collection Time: 10/03/21  9:15 PM   Specimen: Nasal Mucosa; Nasal Swab  Result Value Ref Range Status   MRSA by PCR Next Gen DETECTED (A) NOT DETECTED Final    Comment: CRITICAL RESULT CALLED TO, READ BACK BY AND VERIFIED WITH: GRAHAM,A AT 2301 ON 10/03/21 BY [email or web address] (NOTE) The GeneXpert MRSA Assay (FDA approved for NASAL specimens only), is one component of a comprehensive MRSA colonization surveillance program. It is not intended to diagnose MRSA infection nor to guide or monitor treatment for MRSA infections. Test performance is not FDA approved in patients less than 94 years old. Performed at [facility], [place] [address] [place],  [address]    Labs: CBC: Recent Labs  Lab 10/02/21 1635 10/03/21 0500 10/04/21 0320  WBC 24.0* 13.4* 9.1  NEUTROABS 22.4*  --  6.8  HGB 10.4* 8.0* 8.7*  HCT 35.5* 27.5* 29.8*  MCV 66.1* 66.4* 65.6*  PLT 476*  324 073   Basic Metabolic Panel: Recent Labs  Lab 10/02/21 1635 10/02/21 2123 10/03/21 0500 10/04/21 0320  NA 138  --  139 137  K 3.6  --  4.0 3.7  CL 105  --  107 107  CO2 25  --  25 24  GLUCOSE 119*  --  113* 80  BUN 23  --  18 12  CREATININE 0.94  --  0.90 0.71  CALCIUM 8.6*  --  8.3* 8.4*  MG  --  1.2* 1.9 1.6*  PHOS  --  1.8* 4.2  --    Liver Function Tests: Recent Labs  Lab 10/02/21 1635 10/03/21 0500  AST 26 21  ALT 16 15  ALKPHOS 114 96  BILITOT 0.4 0.4  PROT 8.0 6.6  ALBUMIN 2.8* 2.2*   CBG: Recent Labs  Lab 10/03/21 2017 10/03/21 2317 10/04/21 0319 10/04/21 0633 10/04/21 1146  GLUCAP 76 87 89 86 108*    Discharge time spent: greater than 30 minutes.  Signed: Raiford Noble, DO [facility] 10/04/2021

## 2021-10-05 LAB — URINE CULTURE: Culture: >=100000 — AB

## 2021-10-05 LAB — T3: T3, Total: 78 ng/dL (ref 71–180)

## 2021-10-07 LAB — CULTURE, BLOOD (ROUTINE X 2)
Culture: NO GROWTH
Special Requests: ADEQUATE

## 2021-10-08 LAB — CULTURE, BLOOD (ROUTINE X 2): Special Requests: ADEQUATE

## 2022-03-05 ENCOUNTER — Encounter (HOSPITAL_COMMUNITY): Payer: Self-pay

## 2022-03-05 ENCOUNTER — Other Ambulatory Visit: Payer: Self-pay

## 2022-03-05 ENCOUNTER — Inpatient Hospital Stay (HOSPITAL_COMMUNITY)
Admission: EM | Admit: 2022-03-05 | Discharge: 2022-04-02 | DRG: 951 | Disposition: E | Payer: Medicare (Managed Care) | Source: Skilled Nursing Facility | Attending: Family Medicine | Admitting: Family Medicine

## 2022-03-05 ENCOUNTER — Emergency Department (HOSPITAL_COMMUNITY): Payer: Medicare (Managed Care)

## 2022-03-05 DIAGNOSIS — Z1152 Encounter for screening for COVID-19: Secondary | ICD-10-CM | POA: Diagnosis not present

## 2022-03-05 DIAGNOSIS — Z8673 Personal history of transient ischemic attack (TIA), and cerebral infarction without residual deficits: Secondary | ICD-10-CM | POA: Diagnosis not present

## 2022-03-05 DIAGNOSIS — Z515 Encounter for palliative care: Principal | ICD-10-CM

## 2022-03-05 DIAGNOSIS — F0390 Unspecified dementia without behavioral disturbance: Secondary | ICD-10-CM | POA: Diagnosis present

## 2022-03-05 DIAGNOSIS — E119 Type 2 diabetes mellitus without complications: Secondary | ICD-10-CM

## 2022-03-05 DIAGNOSIS — R6521 Severe sepsis with septic shock: Secondary | ICD-10-CM | POA: Diagnosis present

## 2022-03-05 DIAGNOSIS — C679 Malignant neoplasm of bladder, unspecified: Secondary | ICD-10-CM | POA: Diagnosis present

## 2022-03-05 DIAGNOSIS — I10 Essential (primary) hypertension: Secondary | ICD-10-CM | POA: Diagnosis present

## 2022-03-05 DIAGNOSIS — R339 Retention of urine, unspecified: Secondary | ICD-10-CM

## 2022-03-05 DIAGNOSIS — N35912 Unspecified bulbous urethral stricture, male: Secondary | ICD-10-CM | POA: Diagnosis present

## 2022-03-05 DIAGNOSIS — R338 Other retention of urine: Secondary | ICD-10-CM | POA: Diagnosis present

## 2022-03-05 DIAGNOSIS — Z7189 Other specified counseling: Secondary | ICD-10-CM

## 2022-03-05 DIAGNOSIS — F17210 Nicotine dependence, cigarettes, uncomplicated: Secondary | ICD-10-CM | POA: Diagnosis present

## 2022-03-05 DIAGNOSIS — R0602 Shortness of breath: Secondary | ICD-10-CM | POA: Diagnosis present

## 2022-03-05 DIAGNOSIS — J440 Chronic obstructive pulmonary disease with acute lower respiratory infection: Secondary | ICD-10-CM | POA: Diagnosis present

## 2022-03-05 DIAGNOSIS — B964 Proteus (mirabilis) (morganii) as the cause of diseases classified elsewhere: Secondary | ICD-10-CM | POA: Diagnosis present

## 2022-03-05 DIAGNOSIS — J9601 Acute respiratory failure with hypoxia: Secondary | ICD-10-CM | POA: Diagnosis present

## 2022-03-05 DIAGNOSIS — A4159 Other Gram-negative sepsis: Secondary | ICD-10-CM | POA: Diagnosis present

## 2022-03-05 DIAGNOSIS — Z789 Other specified health status: Secondary | ICD-10-CM

## 2022-03-05 DIAGNOSIS — I482 Chronic atrial fibrillation, unspecified: Secondary | ICD-10-CM | POA: Insufficient documentation

## 2022-03-05 DIAGNOSIS — J189 Pneumonia, unspecified organism: Secondary | ICD-10-CM | POA: Diagnosis present

## 2022-03-05 DIAGNOSIS — Z66 Do not resuscitate: Secondary | ICD-10-CM | POA: Diagnosis present

## 2022-03-05 DIAGNOSIS — A419 Sepsis, unspecified organism: Secondary | ICD-10-CM | POA: Insufficient documentation

## 2022-03-05 DIAGNOSIS — Z8659 Personal history of other mental and behavioral disorders: Principal | ICD-10-CM

## 2022-03-05 DIAGNOSIS — Z8551 Personal history of malignant neoplasm of bladder: Secondary | ICD-10-CM

## 2022-03-05 DIAGNOSIS — R0989 Other specified symptoms and signs involving the circulatory and respiratory systems: Secondary | ICD-10-CM

## 2022-03-05 DIAGNOSIS — I4891 Unspecified atrial fibrillation: Secondary | ICD-10-CM | POA: Diagnosis present

## 2022-03-05 DIAGNOSIS — Z79899 Other long term (current) drug therapy: Secondary | ICD-10-CM | POA: Diagnosis not present

## 2022-03-05 DIAGNOSIS — Z87440 Personal history of urinary (tract) infections: Secondary | ICD-10-CM

## 2022-03-05 LAB — COMPREHENSIVE METABOLIC PANEL
ALT: 16 U/L (ref 0–44)
AST: 30 U/L (ref 15–41)
Albumin: 2.6 g/dL — ABNORMAL LOW (ref 3.5–5.0)
Alkaline Phosphatase: 155 U/L — ABNORMAL HIGH (ref 38–126)
Anion gap: 21 — ABNORMAL HIGH (ref 5–15)
BUN: 72 mg/dL — ABNORMAL HIGH (ref 8–23)
CO2: 16 mmol/L — ABNORMAL LOW (ref 22–32)
Calcium: 9.1 mg/dL (ref 8.9–10.3)
Chloride: 103 mmol/L (ref 98–111)
Creatinine, Ser: 4.62 mg/dL — ABNORMAL HIGH (ref 0.61–1.24)
GFR, Estimated: 12 mL/min — ABNORMAL LOW (ref >60)
Glucose, Bld: 156 mg/dL — ABNORMAL HIGH (ref 70–99)
Potassium: 4.1 mmol/L (ref 3.5–5.1)
Sodium: 140 mmol/L (ref 135–145)
Total Bilirubin: 1.6 mg/dL — ABNORMAL HIGH (ref 0.3–1.2)
Total Protein: 8.8 g/dL — ABNORMAL HIGH (ref 6.5–8.1)

## 2022-03-05 LAB — CBC WITH DIFFERENTIAL/PLATELET
Abs Immature Granulocytes: 0 10*3/uL (ref 0.00–0.07)
Basophils Absolute: 0 10*3/uL (ref 0.0–0.1)
Basophils Relative: 0 %
Eosinophils Absolute: 0 10*3/uL (ref 0.0–0.5)
Eosinophils Relative: 0 %
HCT: 41.3 % (ref 39.0–52.0)
Hemoglobin: 12.9 g/dL — ABNORMAL LOW (ref 13.0–17.0)
Lymphocytes Relative: 0 %
Lymphs Abs: 0 10*3/uL — ABNORMAL LOW (ref 0.7–4.0)
MCH: 20.2 pg — ABNORMAL LOW (ref 26.0–34.0)
MCHC: 31.2 g/dL (ref 30.0–36.0)
MCV: 64.6 fL — ABNORMAL LOW (ref 80.0–100.0)
Monocytes Absolute: 0.4 10*3/uL (ref 0.1–1.0)
Monocytes Relative: 1 %
Neutro Abs: 38.2 10*3/uL — ABNORMAL HIGH (ref 1.7–7.7)
Neutrophils Relative %: 99 %
Platelets: 311 10*3/uL (ref 150–400)
RBC: 6.39 MIL/uL — ABNORMAL HIGH (ref 4.22–5.81)
RDW: 20.4 % — ABNORMAL HIGH (ref 11.5–15.5)
WBC: 38.6 10*3/uL — ABNORMAL HIGH (ref 4.0–10.5)
nRBC: 0 % (ref 0.0–0.2)
nRBC: 0 /100 WBC

## 2022-03-05 LAB — RESP PANEL BY RT-PCR (RSV, FLU A&B, COVID)  RVPGX2
Influenza A by PCR: NEGATIVE
Influenza B by PCR: NEGATIVE
Resp Syncytial Virus by PCR: NEGATIVE
SARS Coronavirus 2 by RT PCR: NEGATIVE

## 2022-03-05 LAB — LACTIC ACID, PLASMA
Lactic Acid, Venous: 6 mmol/L (ref 0.5–1.9)
Lactic Acid, Venous: 5.7 mmol/L (ref 0.5–1.9)

## 2022-03-05 LAB — MRSA NEXT GEN BY PCR, NASAL: MRSA by PCR Next Gen: DETECTED — AB

## 2022-03-05 MED ORDER — ACETAMINOPHEN 650 MG RE SUPP: 650.0000 mg | Freq: Four times a day (QID) | RECTAL | Status: DC | PRN

## 2022-03-05 MED ORDER — LORAZEPAM 2 MG/ML PO CONC
1.0000 mg | ORAL | Status: DC | PRN
  Filled 2022-03-05: qty 0.5

## 2022-03-05 MED ORDER — GLYCOPYRROLATE 1 MG PO TABS
1.0000 mg | ORAL_TABLET | ORAL | Status: DC | PRN
  Filled 2022-03-05: qty 1

## 2022-03-05 MED ORDER — NOREPINEPHRINE 4 MG/250ML-% IV SOLN
2.0000 ug/min | INTRAVENOUS | Status: DC
  Administered 2022-03-05: 2 ug/min via INTRAVENOUS
  Filled 2022-03-05: qty 250

## 2022-03-05 MED ORDER — GLYCOPYRROLATE 0.2 MG/ML IJ SOLN: 0.2000 mg | INTRAMUSCULAR | Status: DC | PRN

## 2022-03-05 MED ORDER — DIPHENHYDRAMINE HCL 50 MG/ML IJ SOLN: 12.5000 mg | INTRAMUSCULAR | Status: DC | PRN

## 2022-03-05 MED ORDER — HALOPERIDOL 1 MG PO TABS: 2.0000 mg | ORAL_TABLET | Freq: Four times a day (QID) | ORAL | Status: DC | PRN

## 2022-03-05 MED ORDER — HALOPERIDOL LACTATE 2 MG/ML PO CONC
2.0000 mg | Freq: Four times a day (QID) | ORAL | Status: DC | PRN
  Filled 2022-03-05: qty 5

## 2022-03-05 MED ORDER — ONDANSETRON 4 MG PO TBDP: 4.0000 mg | ORAL_TABLET | Freq: Four times a day (QID) | ORAL | Status: DC | PRN

## 2022-03-05 MED ORDER — VANCOMYCIN VARIABLE DOSE PER UNSTABLE RENAL FUNCTION (PHARMACIST DOSING): Status: DC

## 2022-03-05 MED ORDER — MORPHINE SULFATE (PF) 2 MG/ML IV SOLN: 1.0000 mg | INTRAVENOUS | Status: DC | PRN

## 2022-03-05 MED ORDER — SCOPOLAMINE 1 MG/3DAYS TD PT72
1.0000 | MEDICATED_PATCH | TRANSDERMAL | Status: DC
  Filled 2022-03-05: qty 1

## 2022-03-05 MED ORDER — ACETAMINOPHEN 650 MG RE SUPP
650.0000 mg | RECTAL | Status: AC
  Administered 2022-03-05: 650 mg via RECTAL
  Filled 2022-03-05: qty 1

## 2022-03-05 MED ORDER — LACTATED RINGERS IV BOLUS
1000.0000 mL | Freq: Once | INTRAVENOUS | Status: AC
  Administered 2022-03-05: 1000 mL via INTRAVENOUS

## 2022-03-05 MED ORDER — HYDROMORPHONE HCL 1 MG/ML IJ SOLN
1.0000 mg | INTRAMUSCULAR | Status: DC
  Administered 2022-03-05 – 2022-03-06 (×6): 1 mg via INTRAVENOUS
  Filled 2022-03-05 (×6): qty 1

## 2022-03-05 MED ORDER — SODIUM CHLORIDE 0.9 % IV SOLN: 250.0000 mL | INTRAVENOUS | Status: DC

## 2022-03-05 MED ORDER — LORAZEPAM 2 MG/ML IJ SOLN: 1.0000 mg | INTRAMUSCULAR | Status: DC | PRN

## 2022-03-05 MED ORDER — SODIUM CHLORIDE 0.9 % IV SOLN: INTRAVENOUS | Status: DC

## 2022-03-05 MED ORDER — ACETAMINOPHEN 325 MG PO TABS: 650.0000 mg | ORAL_TABLET | Freq: Four times a day (QID) | ORAL | Status: DC | PRN

## 2022-03-05 MED ORDER — BIOTENE DRY MOUTH MT LIQD
15.0000 mL | Freq: Two times a day (BID) | OROMUCOSAL | Status: DC
  Administered 2022-03-05 – 2022-03-07 (×5): 15 mL via TOPICAL

## 2022-03-05 MED ORDER — VANCOMYCIN HCL IN DEXTROSE 1-5 GM/200ML-% IV SOLN
1000.0000 mg | Freq: Once | INTRAVENOUS | Status: AC
  Administered 2022-03-05: 1000 mg via INTRAVENOUS
  Filled 2022-03-05: qty 200

## 2022-03-05 MED ORDER — LACTATED RINGERS IV BOLUS (SEPSIS)
1000.0000 mL | Freq: Once | INTRAVENOUS | Status: AC
  Administered 2022-03-05: 1000 mL via INTRAVENOUS

## 2022-03-05 MED ORDER — POLYVINYL ALCOHOL 1.4 % OP SOLN
1.0000 [drp] | Freq: Four times a day (QID) | OPHTHALMIC | Status: DC | PRN
  Filled 2022-03-05: qty 15

## 2022-03-05 MED ORDER — SODIUM CHLORIDE 0.9 % IV SOLN
1.0000 g | Freq: Once | INTRAVENOUS | Status: AC
  Administered 2022-03-05: 1 g via INTRAVENOUS
  Filled 2022-03-05: qty 20

## 2022-03-05 MED ORDER — LORAZEPAM 1 MG PO TABS: 1.0000 mg | ORAL_TABLET | ORAL | Status: DC | PRN

## 2022-03-05 MED ORDER — SODIUM CHLORIDE 0.9 % IV SOLN: 1.0000 g | INTRAVENOUS | Status: DC

## 2022-03-05 MED ORDER — ONDANSETRON HCL 4 MG/2ML IJ SOLN: 4.0000 mg | Freq: Four times a day (QID) | INTRAMUSCULAR | Status: DC | PRN

## 2022-03-05 MED ORDER — GLYCOPYRROLATE 0.2 MG/ML IJ SOLN
0.2000 mg | INTRAMUSCULAR | Status: AC
  Administered 2022-03-05 (×3): 0.2 mg via INTRAVENOUS
  Filled 2022-03-05 (×3): qty 1

## 2022-03-05 MED ORDER — HALOPERIDOL LACTATE 5 MG/ML IJ SOLN: 2.0000 mg | Freq: Four times a day (QID) | INTRAMUSCULAR | Status: DC | PRN

## 2022-03-05 MED ORDER — HYDROMORPHONE HCL 1 MG/ML IJ SOLN: 1.0000 mg | INTRAMUSCULAR | Status: DC | PRN

## 2022-03-05 NOTE — ED Notes (Signed)
X-ray at bedside

## 2022-03-05 NOTE — Progress Notes (Signed)
Pharmacy Antibiotic Note  Jason [name] is a 77 y.o. male admitted on 03/17/2022 with Pneumonia.  Pharmacy has been consulted for Vancomycin and Meropenem dosing.  Hx of ESBL E. Coli in urine from 03/2021. Also hx of frequent infections with mixed organisms.  Patient in AKI with Scr 4.62 - Baseline appears <1. Unclear UOP at this time. CrCl 10-20 mL/min based on previous weight 75kg and Heigh 68 in  Plan: Vancomycin '1000mg'$  IV once - subsequent doses based on renal function - F/u MRSA for de-escalation Meropenem 1g Q24H     No data recorded.  No results for input(s): "WBC", "CREATININE", "LATICACIDVEN", "VANCOTROUGH", "VANCOPEAK", "VANCORANDOM", "GENTTROUGH", "GENTPEAK", "GENTRANDOM", "TOBRATROUGH", "TOBRAPEAK", "TOBRARND", "AMIKACINPEAK", "AMIKACINTROU", "AMIKACIN" in the last 168 hours.  CrCl cannot be calculated (Patient's most recent lab result is older than the maximum 21 days allowed.).    No Known Allergies   Thank you for allowing pharmacy to be a part of this patient's care.  Merrilee Jansky, PharmD Clinical Pharmacist 03/24/2022 7:25 AM

## 2022-03-05 NOTE — ED Provider Notes (Signed)
[facility] Provider Note   CSN: [id number] Arrival date & time: 03/25/2022  [id number]     History  Chief Complaint  Patient presents with   SOB   AMS    Jason [name] is a 77 y.o. male.  77 yo M with hx of dementia (non-verbal at baseline), AF not on AC, bladder cancer, and diabetes who presented with SOB. Per staff at his facility was diaphoretic, tachypnic, and satting 51% on RA. Placed on 5L [place] and oxygen level levels improved to 81%.  They also reports that he was behaving differently than usual.  Pt not currently on hospice per facility.  Did have admission several months ago where he was treated for aspiration pneumonia and ESBL UTI.  Patient unable to give additional history at this time.  Jason [name] is being cared for by [facility], please call [phone].        Home Medications Prior to Admission medications   Medication Sig Start Date End Date Taking? Authorizing Provider  acetaminophen (TYLENOL) 325 MG tablet Take 2 tablets (650 mg total) by mouth every 6 (six) hours as needed for mild pain (or Fever >/= 101). 10/04/21  Yes Sheikh, Omair Latif, DO  Morphine Sulfate (MORPHINE CONCENTRATE) 10 MG/0.5ML SOLN concentrated solution Take 0.25 mLs (5 mg total) by mouth every hour as needed for shortness of breath or moderate pain. 10/04/21  Yes Sheikh, Omair Latif, DO  ondansetron (ZOFRAN-ODT) 4 MG disintegrating tablet Take 1 tablet (4 mg total) by mouth every 6 (six) hours as needed for nausea. 10/04/21  Yes Raiford Noble Latif, DO      Allergies    Patient has no known allergies.    Review of Systems   Review of Systems  Physical Exam Updated Vital Signs BP (!) 91/56 (BP Location: Left Arm)   Pulse (!) 115   Temp (!) 97.5 F (36.4 C) (Oral)   Resp (!) 29   SpO2 99%  Physical Exam Vitals and nursing note reviewed.  Constitutional:      General: He is in acute distress.     Appearance: He is well-developed. He is  ill-appearing.     Comments: Not responding to commands.  In respiratory distress on nonrebreather.  HENT:     Head: Normocephalic and atraumatic.     Right Ear: External ear normal.     Left Ear: External ear normal.     Nose: Nose normal.  Eyes:     Extraocular Movements: Extraocular movements intact.     Conjunctiva/sclera: Conjunctivae normal.     Pupils: Pupils are equal, round, and reactive to light.  Cardiovascular:     Rate and Rhythm: Tachycardia present. Rhythm irregular.     Heart sounds: Normal heart sounds.  Pulmonary:     Effort: Respiratory distress present.     Breath sounds: Rhonchi (Diffuse) present.  Abdominal:     General: There is no distension.     Palpations: Abdomen is soft. There is no mass.     Tenderness: There is no abdominal tenderness. There is no guarding.  Musculoskeletal:     Cervical back: Normal range of motion and neck supple.     Right lower leg: No edema.     Left lower leg: No edema.  Skin:    General: Skin is warm and dry.     Findings: No rash.     Comments: No wounds of the lower extremities or sacral wounds noted  Neurological:     Mental Status: Mental status is at baseline.     Comments: Moves all 4 extremities  Psychiatric:        Mood and Affect: Mood normal.        Behavior: Behavior normal.     ED Results / Procedures / Treatments   Labs (all labs ordered are listed, but only abnormal results are displayed) Labs Reviewed  MRSA NEXT GEN BY PCR, NASAL - Abnormal; Notable for the following components:      Result Value   MRSA by PCR Next Gen DETECTED (*)    All other components within normal limits  LACTIC ACID, PLASMA - Abnormal; Notable for the following components:   Lactic Acid, Venous 6.0 (*)    All other components within normal limits  LACTIC ACID, PLASMA - Abnormal; Notable for the following components:   Lactic Acid, Venous 5.7 (*)    All other components within normal limits  COMPREHENSIVE METABOLIC PANEL -  Abnormal; Notable for the following components:   CO2 16 (*)    Glucose, Bld 156 (*)    BUN 72 (*)    Creatinine, Ser 4.62 (*)    Total Protein 8.8 (*)    Albumin 2.6 (*)    Alkaline Phosphatase 155 (*)    Total Bilirubin 1.6 (*)    GFR, Estimated 12 (*)    Anion gap 21 (*)    All other components within normal limits  CBC WITH DIFFERENTIAL/PLATELET - Abnormal; Notable for the following components:   WBC 38.6 (*)    RBC 6.39 (*)    Hemoglobin 12.9 (*)    MCV 64.6 (*)    MCH 20.2 (*)    RDW 20.4 (*)    Neutro Abs 38.2 (*)    Lymphs Abs 0.0 (*)    All other components within normal limits  RESP PANEL BY RT-PCR (RSV, FLU A&B, COVID)  RVPGX2  CULTURE, BLOOD (ROUTINE X 2)  CULTURE, BLOOD (ROUTINE X 2)    EKG EKG Interpretation  Date/Time:  Monday March 05 2022 07:23:54 EST Ventricular Rate:  150 PR Interval:  118 QRS Duration: 114 QT Interval:  272 QTC Calculation: 430 R Axis:   90 Text Interpretation: Supraventricular tachycardia Incomplete right bundle branch block Confirmed by Margaretmary Eddy [phone]) on 03/24/2022 7:34:37 AM  Radiology CT Head Wo Contrast  Result Date: 03/25/2022 CLINICAL DATA:  Altered mental status, unknown cause. EXAM: CT HEAD WITHOUT CONTRAST TECHNIQUE: Contiguous axial images were obtained from the base of the skull through the vertex without intravenous contrast. RADIATION DOSE REDUCTION: This exam was performed according to the departmental dose-optimization program which includes automated exposure control, adjustment of the mA and/or kV according to patient size and/or use of iterative reconstruction technique. COMPARISON:  Head CT 10/02/2021. FINDINGS: Brain: No acute intracranial hemorrhage. 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 Old lacunar infarcts in the bilateral thalami and left corona radiata. No hydrocephalus or extra-axial  collection. Vascular: No hyperdense vessel or unexpected calcification. Skull: No calvarial fracture or suspicious bone lesion. Skull base is unremarkable. Sinuses/Orbits: Trace fluid in the right sphenoid and left maxillary sinuses. Mastoids are well aerated. Orbits are unremarkable. Other: None. IMPRESSION: 1. No acute intracranial hemorrhage. 2. 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 Electronically Signed   By: Emmit Alexanders M.D.   On: 03/20/2022 08:47   DG Chest  Port 1 View  Result Date: 03/31/2022 CLINICAL DATA:  Possible sepsis EXAM: PORTABLE CHEST 1 VIEW COMPARISON:  CXR 03/16/21, CXR 10/02/21 FINDINGS: Redemonstrated is a moderate right-sided pleural effusion which is new compared to 03/16/2021, and unchanged compared to 10/02/2021. No pneumothorax. Redemonstrated are patchy airspace opacities in the bilateral lungs, with likely at least slight interval progression the right lung apex. Cardiac and mediastinal contours are unchanged. No radiographically displaced rib fractures. Visualized upper abdomen is unremarkable. IMPRESSION: 1. Redemonstrated moderate right-sided pleural effusion which is new compared to 03/16/2021, and unchanged compared to 10/02/2021. 2. Redemonstrated are patchy airspace opacities in the bilateral lungs, with likely at least slight interval progression at the right lung apex. Electronically Signed   By: Marin Roberts M.D.   On: 03/13/2022 08:00    Procedures Procedures   Medications Ordered in ED Medications  acetaminophen (TYLENOL) tablet 650 mg (has no administration in time range)    Or  acetaminophen (TYLENOL) suppository 650 mg (has no administration in time range)  LORazepam (ATIVAN) tablet 1 mg (has no administration in time range)    Or  LORazepam (ATIVAN) 2 MG/ML concentrated solution 1 mg (has no administration in time range)  haloperidol (HALDOL) tablet 2 mg (has no administration in time range)    Or   haloperidol (HALDOL) 2 MG/ML solution 2 mg (has no administration in time range)    Or  haloperidol lactate (HALDOL) injection 2 mg (has no administration in time range)  diphenhydrAMINE (BENADRYL) injection 12.5 mg (has no administration in time range)  ondansetron (ZOFRAN-ODT) disintegrating tablet 4 mg (has no administration in time range)    Or  ondansetron (ZOFRAN) injection 4 mg (has no administration in time range)  glycopyrrolate (ROBINUL) tablet 1 mg (has no administration in time range)    Or  glycopyrrolate (ROBINUL) injection 0.2 mg (has no administration in time range)    Or  glycopyrrolate (ROBINUL) injection 0.2 mg (has no administration in time range)  antiseptic oral rinse (BIOTENE) solution 15 mL (15 mLs Topical Given 03/04/2022 1450)  polyvinyl alcohol (LIQUIFILM TEARS) 1.4 % ophthalmic solution 1 drop (has no administration in time range)  HYDROmorphone (DILAUDID) injection 1 mg (has no administration in time range)  HYDROmorphone (DILAUDID) injection 1 mg (1 mg Intravenous Given 03/14/2022 1449)  glycopyrrolate (ROBINUL) injection 0.2 mg (0.2 mg Intravenous Given 03/21/2022 1449)  lactated ringers bolus 1,000 mL (0 mLs Intravenous Stopped 03/19/2022 0845)    And  lactated ringers bolus 1,000 mL (0 mLs Intravenous Stopped 03/15/2022 0931)  vancomycin (VANCOCIN) IVPB 1000 mg/200 mL premix (0 mg Intravenous Stopped 03/22/2022 0953)  meropenem (MERREM) 1 g in sodium chloride 0.9 % 100 mL IVPB (0 g Intravenous Stopped 03/28/2022 0845)  lactated ringers bolus 1,000 mL (0 mLs Intravenous Stopped 03/20/2022 1035)  acetaminophen (TYLENOL) suppository 650 mg (650 mg Rectal Given 03/28/2022 1032)    ED Course/ Medical Decision Making/ A&P Clinical Course as of 03/26/2022 1554  Mon Mar 05, 2022  1017 Spoke with pace who states that he has a guardian through social services. States that he should be comfort measures only (no antibiotics, IV fluids, and no feeding tubes) completed in march 2023. Timoteo Gaul is  his guardian [phone] and [phone].  [RP]  [facility] was contacted and states that he is new to her. Says that she will have to talk to her group about his care.  [RP]  1031 Dr Lovena Neighbours from urology contacted. Can be reached at [phone].  [RP]  [id number]  Caren Griffins has called back and confirmed that he is comfort measures only.  [RP]  Lem[place]ne from palliative consulted.  Will see the patient shortly. [RP]  1052 Dr Feliberto Gottron from hospitalist will admit the patient. [RP]    Clinical Course User Index [RP] Fransico Meadow, MD                             Medical Decision Making Amount and/or Complexity of Data Reviewed Labs: ordered. Radiology: ordered. ECG/medicine tests: ordered.  Risk OTC drugs. Prescription drug management. Decision regarding hospitalization.   Jason [name] is a 77 y.o. male with comorbidities that complicate the patient evaluation including hx of dementia (non-verbal at baseline), AF not on AC, bladder cancer, and diabetes who presented with SOB, fever, hypoxia, and altered mental status  Initial Ddx:  Pneumonia, UTI, URI, ICH  MDM:  Concern the patient may have a pneumonia given his presentation.  Will cover with broad-spectrum antibiotics initiate septic workup along with urine cultures for him.  Also obtain COVID and flu and head CT to further evaluate for his altered mental status.  Of note, is DNR/DNI.  Plan:  Labs Lactate Blood cultures Chest x-ray IV antibiotics IV fluids CT head  ED Summary/Re-evaluation:  Patient reevaluated in the emergency department with improvement of his heart rate and blood pressure with IV fluids.  Was started on vancomycin and meropenem based on prior cultures.  Chest x-ray did show pneumonia and patient was in septic shock with lactate of 6 that only mildly down trended.  Called his hospice team who stated that he wanted to be comfort care only.  Did also call his guardian who confirmed this.  Patient was  admitted to the medicine with palliative care consult for comfort care.  Patient was also found to have AKI and retaining urine so urology was consulted regarding Foley catheter placement after multiple attempts in the emergency department were unsuccessful.  This patient presents to the ED for concern of complaints listed in HPI, this involves an extensive number of treatment options, and is a complaint that carries with it a high risk of complications and morbidity. Disposition including potential need for admission considered.   Dispo: Admit to Floor  Additional history obtained from  hospice team, legal guardian Records reviewed Outpatient Clinic Notes, Admission Notes, and DC Summary The following labs were independently interpreted: Chemistry and show AKI I independently reviewed the following imaging with scope of interpretation limited to determining acute life threatening conditions related to emergency care: Chest x-ray and agree with the radiologist interpretation with the following exceptions: None I personally reviewed and interpreted cardiac monitoring: sinus tachycardia I personally reviewed and interpreted the pt's EKG: see above for interpretation  I have reviewed the patients home medications and made adjustments as needed Consults: Hospitalist and Urology Social Determinants of health:  SNF resident   Final Clinical Impression(s) / ED Diagnoses Final diagnoses:  History of dementia  Septic shock ([place])  Pneumonia of right lung due to infectious organism, unspecified part of lung  Urinary retention    Rx / DC Orders ED Discharge Orders     None      CRITICAL CARE Performed by: Fransico Meadow   Total critical care time: 90 minutes  Critical care time was exclusive of separately billable procedures and treating other patients.  Critical care was necessary to treat or prevent imminent or life-threatening deterioration.  Critical care was time  spent  personally by me on the following activities: development of treatment plan with patient and/or surrogate as well as nursing, discussions with consultants, evaluation of patient's response to treatment, examination of patient, obtaining history from patient or surrogate, ordering and performing treatments and interventions, ordering and review of laboratory studies, ordering and review of radiographic studies, pulse oximetry and re-evaluation of patient's condition.    Fransico Meadow, MD 03/27/2022 [phone]

## 2022-03-05 NOTE — ED Notes (Signed)
Patient transported to CT 

## 2022-03-05 NOTE — Consult Note (Addendum)
Urology Consult   Physician requesting consult: Lucienne Minks, MD   Reason for consult: Urinary retention   History of Present Illness: Jason [name] is a 77 y.o. male, previously followed by Dr. Louis Meckel, who is currently being treated for sepsis suspected to be secondary to pneumonia.  He has a history of high-grade T1 bladder cancer and dense bulbar urethral stricture with his most recent urethral dilation being on 03/02/21 with Dr. Junious Silk while the patient was intubated in the ICU.  Currently, the patient is non-verbal and has no family or guardian at bedside.   While in the ER, the patient was found to have a PVR >575 mL and the ER staff was unable to place a Foley catheter likely due to his dense bulbar stricture.    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    Past Surgical History:  Procedure Laterality Date   BALLOON DILATION N/A 04/07/2014   Procedure: BALLOON DILATION;  Surgeon: Ardis Hughs, MD;  Location: [facility];  Service: Urology;  Laterality: N/A;   CYSTOSCOPY W/ RETROGRADES Bilateral 04/07/2014   Procedure: BILATERAL RETROGRADE PYELOGRAM;  Surgeon: Ardis Hughs, MD;  Location: [facility];  Service: Urology;  Laterality: Bilateral;   CYSTOSCOPY/RETROGRADE/URETEROSCOPY Bilateral 02/13/2013   Procedure: BILATERAL RETROGRADE;  Surgeon: Ardis Hughs, MD;  [facility];  Service: Urology;  Laterality: Bilateral;   INGUINAL HERNIA REPAIR Right 01/30/2018   Procedure: OPEN RIGHT INGUINAL HERNIA REPAIR WITH MESH;  Surgeon: Erroll Luna, MD;  Location: [place];  Service: General;  Laterality: Right;   INGUINAL HERNIA REPAIR Left 03/07/2021   Procedure: HERNIA REPAIR INGUINAL WITH MESH;  Surgeon: Rolm Bookbinder, MD;  Location: [place];  Service: General;  Laterality: Left;   INSERTION OF MESH Left 03/07/2021   Procedure: INSERTION OF MESH;  Surgeon: Rolm Bookbinder, MD;  Location: [place];  Service: General;  Laterality: Left;   LAPAROTOMY N/A 02/18/2013   Procedure: EXPLORATORY LAPAROTOMY drainage of retroperitoneal abscess, drainage of Pre-Peritoneal abscess and Exploration of bladder perforation;  Surgeon: Imogene Burn. Georgette Dover, MD;  Location: [place];  Service: General;  Laterality: N/A;   TRANSURETHRAL RESECTION OF BLADDER TUMOR WITH GYRUS (TURBT-GYRUS) N/A 02/13/2013   Procedure: TRANSURETHRAL RESECTION OF BLADDER TUMOR WITH GYRUS (TURBT-GYRUS), bladder biopsies;  Surgeon: Ardis Hughs, MD;  [facility];  Service: Urology;  Laterality: N/A;    Current Hospital Medications:  Home Meds:  Current Meds  Medication Sig   acetaminophen (TYLENOL) 325 MG tablet Take 2 tablets (650 mg total) by mouth every 6 (six) hours as needed for mild pain (or Fever >/= 101).   Morphine Sulfate (MORPHINE CONCENTRATE) 10 MG/0.5ML SOLN concentrated solution Take 0.25 mLs (5 mg total) by mouth every hour as needed for shortness of breath or moderate pain.   ondansetron (ZOFRAN-ODT) 4 MG disintegrating tablet Take 1 tablet (4 mg total) by mouth every 6 (six) hours as needed for nausea.    Scheduled Meds:  antiseptic oral rinse  15 mL  Topical BID   glycopyrrolate  0.2 mg Intravenous Q4H    HYDROmorphone (DILAUDID) injection  1 mg Intravenous Q4H   Continuous Infusions: PRN Meds:.acetaminophen **OR** acetaminophen, diphenhydrAMINE, glycopyrrolate **OR** glycopyrrolate **OR** glycopyrrolate, haloperidol **OR** haloperidol **OR** haloperidol lactate, HYDROmorphone (DILAUDID) injection, LORazepam **OR** LORazepam, ondansetron **OR**  ondansetron (ZOFRAN) IV, polyvinyl alcohol  Allergies: No Known Allergies  History reviewed. No pertinent family history.  Social History:  reports that he has been smoking cigarettes. He has been smoking an average of 1 pack per day. He has never used smokeless tobacco. He reports current alcohol use. He reports that he does not use drugs.  ROS: A complete review of systems was unable to be performed due to the patient being non-verbal.  Physical Exam:  Vital signs in last 24 hours: Temp:  [97.5 F (36.4 C)-102.5 F (39.2 C)] 97.5 F (36.4 C) (03/04 1229) Pulse Rate:  [62-159] 115 (03/04 1229) Resp:  [19-33] 29 (03/04 1200) BP: (77-158)/(51-104) 91/56 (03/04 1229) SpO2:  [82 %-100 %] 99 % (03/04 1457) Constitutional:  Non-verbal and agitated when manipulated GU:  Penis is uncircumcised with no blood at the urethral meatus  Laboratory Data:  Recent Labs    03/16/2022 0808  WBC 38.6*  HGB 12.9*  HCT 41.3  PLT 311    Recent Labs    03/08/2022 0808  NA 140  K 4.1  CL 103  GLUCOSE 156*  BUN 72*  CALCIUM 9.1  CREATININE 4.62*     Results for orders placed or performed during the hospital encounter of 03/09/2022 (from the past 24 hour(s))  Resp panel by RT-PCR (RSV, Flu A&B, Covid)     Status: None   Collection Time: 03/27/2022  7:21 AM   Specimen: Nasal Swab  Result Value Ref Range   SARS Coronavirus 2 by RT PCR NEGATIVE NEGATIVE   Influenza A by PCR NEGATIVE NEGATIVE   Influenza B by PCR NEGATIVE NEGATIVE   Resp Syncytial Virus by PCR NEGATIVE NEGATIVE  MRSA Next Gen by PCR, Nasal     Status: Abnormal   Collection Time: 03/28/2022  7:29 AM   Specimen: Nasal Mucosa; Nasal Swab  Result Value Ref Range   MRSA by PCR Next Gen DETECTED (A) NOT DETECTED  Lactic acid, plasma     Status: Abnormal   Collection Time: 03/28/2022  7:58 AM  Result Value Ref Range   Lactic Acid, Venous 6.0 (HH) 0.5 - 1.9 mmol/L  Comprehensive metabolic panel     Status: Abnormal   Collection Time:  03/30/2022  8:08 AM  Result Value Ref Range   Sodium 140 135 - 145 mmol/L   Potassium 4.1 3.5 - 5.1 mmol/L   Chloride 103 98 - 111 mmol/L   CO2 16 (L) 22 - 32 mmol/L   Glucose, Bld 156 (H) 70 - 99 mg/dL   BUN 72 (H) 8 - 23 mg/dL   Creatinine, Ser 4.62 (H) 0.61 - 1.24 mg/dL   Calcium 9.1 8.9 - 10.3 mg/dL   Total Protein 8.8 (H) 6.5 - 8.1 g/dL   Albumin 2.6 (L) 3.5 - 5.0 g/dL   AST 30 15 - 41 U/L   ALT 16 0 - 44 U/L   Alkaline Phosphatase 155 (H) 38 - 126 U/L   Total Bilirubin 1.6 (H) 0.3 - 1.2 mg/dL   GFR, Estimated 12 (L) >60 mL/min   Anion gap 21 (H) 5 - 15  CBC with Differential     Status: Abnormal   Collection Time: 03/02/2022  8:08 AM  Result Value Ref Range   WBC 38.6 (H) 4.0 - 10.5 K/uL   RBC 6.39 (H) 4.22 - 5.81 MIL/uL   Hemoglobin 12.9 (L) 13.0 - 17.0 g/dL   HCT 41.3 39.0 - 52.0 %   MCV 64.6 (L) 80.0 - 100.0 fL   MCH 20.2 (L) 26.0 - 34.0 pg   MCHC 31.2 30.0 - 36.0 g/dL   RDW 20.4 (H) 11.5 - 15.5 %   Platelets 311 150 - 400 K/uL   nRBC 0.0 0.0 - 0.2 %   Neutrophils Relative % 99 %   Neutro Abs 38.2 (H) 1.7 - 7.7 K/uL   Lymphocytes Relative 0 %   Lymphs Abs 0.0 (L) 0.7 - 4.0 K/uL   Monocytes Relative 1 %   Monocytes Absolute 0.4 0.1 - 1.0 K/uL   Eosinophils Relative 0 %   Eosinophils Absolute 0.0 0.0 - 0.5 K/uL   Basophils Relative 0 %   Basophils Absolute 0.0 0.0 - 0.1 K/uL   nRBC 0 0 /100 WBC   Abs Immature Granulocytes 0.00 0.00 - 0.07 K/uL   Polychromasia PRESENT   Lactic acid, plasma     Status: Abnormal   Collection Time: 03/20/2022  9:56 AM  Result Value Ref Range   Lactic Acid, Venous 5.7 (HH) 0.5 - 1.9 mmol/L   Recent Results (from the past 240 hour(s))  Resp panel by RT-PCR (RSV, Flu A&B, Covid)     Status: None   Collection Time: 03/04/2022  7:21 AM   Specimen: Nasal Swab  Result Value Ref Range Status   SARS Coronavirus 2 by RT PCR NEGATIVE NEGATIVE Final   Influenza A by PCR NEGATIVE NEGATIVE Final   Influenza B by PCR NEGATIVE NEGATIVE Final     Comment: (NOTE) The Xpert Xpress SARS-CoV-2/FLU/RSV plus assay is intended as an aid in the diagnosis of influenza from Nasopharyngeal swab specimens and should not be used as a sole basis for treatment. Nasal washings and aspirates are unacceptable for Xpert Xpress SARS-CoV-2/FLU/RSV testing.  Fact Sheet for Patients: EntrepreneurPulse.com.au  Fact Sheet for Healthcare Providers: IncredibleEmployment.be  This test is not yet approved or cleared by the [place] FDA and has been authorized for detection and/or diagnosis of SARS-CoV-2 by FDA under an Emergency Use Authorization (EUA). This EUA will remain in effect (meaning this test can be used) for the duration of the COVID-19 declaration under Section 564(b)(1) of the Act, 21 U.S.C. section 360bbb-3(b)(1), unless the authorization is terminated or revoked.     Resp Syncytial Virus by PCR NEGATIVE NEGATIVE Final    Comment: (NOTE) Fact Sheet for Patients: EntrepreneurPulse.com.au  Fact Sheet for Healthcare Providers: IncredibleEmployment.be  This test is not yet approved or cleared by the [place] FDA and has been authorized for detection and/or diagnosis of SARS-CoV-2 by FDA under an Emergency Use Authorization (EUA). This EUA will remain in effect (meaning this test can be used) for the duration of the COVID-19 declaration under Section 564(b)(1) of the Act, 21 U.S.C. section 360bbb-3(b)(1), unless the authorization is terminated or revoked.  Performed at [facility], [place] [address], [place], [place] [address]   MRSA Next Gen by PCR, Nasal     Status: Abnormal   Collection Time: 03/27/2022  7:29 AM   Specimen: Nasal Mucosa; Nasal Swab  Result Value Ref Range Status   MRSA by PCR Next Gen DETECTED (A) NOT DETECTED Final    Comment: RESULT CALLED TO, READ BACK BY AND VERIFIED WITH: P.  PULLIAM RN, AT [id number] 03/23/2022 D. VANHOOK (NOTE) The  GeneXpert MRSA Assay (FDA approved for NASAL specimens only), is one component of a comprehensive MRSA colonization surveillance program. It is not intended to diagnose MRSA infection nor to guide or monitor treatment for MRSA infections. Test performance is not FDA approved in patients less than 31 years old. Performed at [facility], [place] [address] [place], [place] [address]     Renal Function: Recent Labs    03/09/2022 [id number]  CREATININE 4.62*   CrCl cannot be calculated (Unknown ideal weight.).  Radiologic Imaging: CT Head Wo Contrast  Result Date: 03/04/2022 CLINICAL DATA:  Altered mental status, unknown cause. EXAM: CT HEAD WITHOUT CONTRAST TECHNIQUE: Contiguous axial images were obtained from the base of the skull through the vertex without intravenous contrast. RADIATION DOSE REDUCTION: This exam was performed according to the departmental dose-optimization program which includes automated exposure control, adjustment of the mA and/or kV according to patient size and/or use of iterative reconstruction technique. COMPARISON:  Head CT 10/02/2021. FINDINGS: Brain: No acute intracranial hemorrhage. 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 Old lacunar infarcts in the bilateral thalami and left corona radiata. No hydrocephalus or extra-axial collection. Vascular: No hyperdense vessel or unexpected calcification. Skull: No calvarial fracture or suspicious bone lesion. Skull base is unremarkable. Sinuses/Orbits: Trace fluid in the right sphenoid and left maxillary sinuses. Mastoids are well aerated. Orbits are unremarkable. Other: None. IMPRESSION: 1. No acute intracranial hemorrhage. 2. 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 Electronically Signed   By: Emmit Alexanders M.D.   On: 03/21/2022 08:47   DG Chest Port 1  View  Result Date: 03/22/2022 CLINICAL DATA:  Possible sepsis EXAM: PORTABLE CHEST 1 VIEW COMPARISON:  CXR 03/16/21, CXR 10/02/21 FINDINGS: Redemonstrated is a moderate right-sided pleural effusion which is new compared to 03/16/2021, and unchanged compared to 10/02/2021. No pneumothorax. Redemonstrated are patchy airspace opacities in the bilateral lungs, with likely at least slight interval progression the right lung apex. Cardiac and mediastinal contours are unchanged. No radiographically displaced rib fractures. Visualized upper abdomen is unremarkable. IMPRESSION: 1. Redemonstrated moderate right-sided pleural effusion which is new compared to 03/16/2021, and unchanged compared to 10/02/2021. 2. Redemonstrated are patchy airspace opacities in the bilateral lungs, with likely at least slight interval progression at the right lung apex. Electronically Signed   By: Marin Roberts M.D.   On: 03/03/2022 08:00    I independently reviewed the above imaging studies.  Impression/Recommendation 77 year old male with a dense bulbar urethral stricture and history of bladder cancer.  Currently being treated for sepsis likely 2/2 pneumonia.  Multiple unsuccessful attempts were made by the ER staff and myself to place a Foley catheter due to his dense bulbar stricture and would require a surgical intervention either via a suprapubic tube or cystoscopy with urethral dilation under general anesthesia.  He is currently on comfort measures, but after speaking with his daughter Lattie Clarida, she would like to have a catheter placed surgically if necessary.  I spoke with Timoteo Gaul who is his legal guardian and confirms that the patient is comfort measures only and no surgical intervention should be pursued at this time.   -I recommend monitoring the patient's urine output with a purewick as the nursing staff if fairly confident that he is voiding spontaneously.  If urine output is inadequate or if he  has a decline  in his renal function, recommend suprapubic tube placement with IR if any intervention is allowable/desired by his legal guardianship.    Ellison Hughs, MD [facility] 03/12/2022, 3:56 PM

## 2022-03-05 NOTE — Plan of Care (Signed)
After urology was unable to place foley for now, RN noticed pt had a large urine occurrence in bed, RN and NA cleaned pt up and placed a male purewick on pt for now to monitor any output, pt possibly going for IR suprapubic cath placement tomorrow per urology note. Pt daughter Vonzella called RN, RN updated pt daughter on pt status. Daughter Minus Liberty seems interested in placing cath if pt unable to void.  RN also called Macky Lower, pt legal guardian and updated her as well, per pt legal guardian the state would have to be the ones to decide if pt has the suprapubic placed or not, Legal guardian was also updated by MD, legal guardian to have more on answer tomorrow depending on pt status. RN was made aware of after hours phone number to call if need be for pt legal guardian [phone].

## 2022-03-05 NOTE — ED Notes (Signed)
Family at bedside. 

## 2022-03-05 NOTE — Consult Note (Signed)
Consultation Note Date: 03/04/2022   Patient Name: Jason [name]  DOB: 07/18/1945  MRN: [id number]  Age / Sex: 77 y.o., male  PCP: Janifer Adie, MD (Inactive) Referring Physician: Fransico Meadow, MD  Reason for Consultation: Establishing goals of care, "comfort care, sepsis from pna "  HPI/Patient Profile: 77 y.o. male  with past medical history of afib (not on [facility]), bladder cancer, diabetes, dementia, stroke, COPD presented to ED on 03/04/2022 from [facility] with concerns of shortness of breath and AMS. Patient was evaluated in ED and found to be severely ill with sepsis from a pneumonia /moderate R pleural effusion. After EDP spoke with legal guardian, goals were changed to comfort measures only status and PMT was consulted to assist with symptom management. Patient  admitted on 03/03/2022 for end of life care.   Clinical Assessment and Goals of Care: Discussed case in detail with Dr. Sharlett Iles. Was given [facility] legal guardian contact information, which is different than Emergency Contact listed in chart. He requests PMT call legal guardian to verify goals for comfort care.  11:05 AM Called legal guardian/Cynthia Aretha Parrot [phone]. She is unable to speak at this time - will call back.  11:30 AM Received return call from Caren Griffins - she explains that Nira Conn was patient's intake coordinator/guardian and she is patient's ongoing care coordinator/guardian. She reviews patient's ongoing decline for the last several months, multiple hospitalizations, and tells me they understood patient would need comfort measures/EOL care likely soon - she indicates "we were prepared for this decision." She indicates she wishes [name] had called them first to prevent hospitalization. Reviewed that patient is likely not stable for transfer back to facility at this time and we anticipate a hospital death - this is not surprising to her  and she expresses understanding. Caren Griffins has already been in contact with patient's daughter to explain current situation - daughter is on her way to the hospital.   Questions and concerns were addressed. PMT number was provided.  I have reviewed medical records including EPIC notes, labs, and imaging. Received report from primary RN - no acute concerns.   Went to visit patient at bedside - daughter/Vonzella present. Patient was lying in bed unresponsive. No signs or non-verbal gestures of pain or discomfort noted. No respiratory distress or increased work of breathing; secretions noted. He is on a non-rebreather.   Emotional support provided to [facility]. Confirmed I had spoken to patient's legal guardian/Cynthia and goals are now for full comfort care. Gently reviewed that patient is approaching end of life and that he would be admitted for end of life care. Vonzella confirms goals to keep him comfortable, but indicates she remains "hopeful he will get better." Reviewed his progressive decline and that he likely will not survive this hospitalization; though expressed we could hope for the best and prepare for if outcomes do not go the way she hopes - she expressed agreement. Chaplain support offered - she declines. Therapeutic listening provided as she reflects on patient's progressive decline, indicating he "has not been walking for a while." Reviewed symptom management plan for EOL comfort - she expressed understanding.  Questions and concerns were addressed. PMT card was provided.   Primary Decision Maker: LEGAL GUARDIAN - Timoteo Gaul [phone]     SUMMARY OF RECOMMENDATIONS   Full comfort measures Continue DNR/DNI as previously documented Patient is likely not stable for transfer at this time - anticipate hospital death Wean to room air Added orders for EOL symptom management and to  reflect full comfort measures, as well as discontinued orders that were not focused on  comfort Unrestricted visitation orders were placed per current  EOL visitation policy  Nursing to provide frequent assessments and administer PRN medications as clinically necessary to ensure EOL comfort PMT will continue to follow and support holistically  Symptom Management Scheduled dilaudid q4h; PRN doses for breakthrough symptoms Tylenol PRN pain/fever Biotin twice daily Benadryl PRN itching Scheduled robinul x3 doses; PRN doses for breakthrough symptoms Haldol PRN agitation/delirium Ativan PRN anxiety/seizure/sleep/distress Zofran PRN nausea/vomiting Liquifilm Tears PRN dry eye    Code Status/Advance Care Planning: DNR  Palliative Prophylaxis:  Aspiration, Frequent Pain Assessment, Oral Care, and Turn Reposition  Additional Recommendations (Limitations, Scope, Preferences): Full Comfort Care  Psycho-social/Spiritual:  Desire for further Chaplaincy support:no Created space and opportunity for patient and family to express thoughts and feelings regarding patient's current medical situation.  Emotional support and therapeutic listening provided.  Prognosis:  Hours - Days  Discharge Planning: Anticipated Hospital Death      Primary Diagnoses: Present on Admission: **None**   I have reviewed the medical record, interviewed the patient and family, and examined the patient. The following aspects are pertinent.  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   Social History   Socioeconomic History   Marital status:  Divorced    Spouse name: Not on file   Number of children: Not on file   Years of education: Not on file   Highest education level: Not on file  Occupational History   Not on file  Tobacco Use   Smoking status: Every Day    Packs/day: 1.00    Types: Cigarettes   Smokeless tobacco: Never  Substance and Sexual Activity   Alcohol use: Yes    Comment: 2- 4 ounces   Drug use: No   Sexual activity: Not Currently  Other Topics Concern   Not on file  Social History Narrative   Not on file   Social Determinants of Health   Financial Resource Strain: Not on file  Food Insecurity: Not on file  Transportation Needs: Not on file  Physical Activity: Not on file  Stress: Not on file  Social Connections: Not on file   History reviewed. No pertinent family history. Scheduled Meds:  vancomycin variable dose per unstable renal function (pharmacist dosing)   Does not apply See admin instructions   Continuous Infusions:  sodium chloride 150 mL/hr at 03/31/2022 0850   sodium chloride Stopped (03/16/2022 1039)   [START ON 03/06/2022] meropenem (MERREM) IV     PRN Meds:. Medications Prior to Admission:  Prior to Admission medications   Medication Sig Start Date End Date Taking? Authorizing Provider  acetaminophen (TYLENOL) 325 MG tablet Take 2 tablets (650 mg total) by mouth every 6 (six) hours as needed for mild pain (or Fever >/= 101). 10/04/21  Yes Sheikh, Omair Latif, DO  Morphine Sulfate (MORPHINE CONCENTRATE) 10 MG/0.5ML SOLN concentrated solution Take 0.25 mLs (5 mg total) by mouth every  hour as needed for shortness of breath or moderate pain. 10/04/21  Yes Sheikh, Omair Latif, DO  ondansetron (ZOFRAN-ODT) 4 MG disintegrating tablet Take 1 tablet (4 mg total) by mouth every 6 (six) hours as needed for nausea. 10/04/21  Yes Sheikh, [place], DO   No Known Allergies Review of Systems  Unable to perform ROS: Acuity of condition    Physical Exam Vitals and nursing note reviewed.   Constitutional:      General: He is not in acute distress.    Appearance: He is ill-appearing.  Pulmonary:     Effort: No respiratory distress.     Comments: Respiratory secretions  Skin:    General: Skin is warm and dry.  Neurological:     Mental Status: He is unresponsive.     Motor: Weakness present.  Psychiatric:        Speech: He is noncommunicative.     Vital Signs: BP (!) 86/60   Pulse (!) 127   Temp (!) 102.5 F (39.2 C) (Rectal)   Resp (!) 27   SpO2 98%  Pain Scale: Faces       SpO2: SpO2: 98 % O2 Device:SpO2: 98 % O2 Flow Rate: .   IO: Intake/output summary: No intake or output data in the 24 hours ending 03/10/2022 1102  LBM:   Baseline Weight:   Most recent weight:       Palliative Assessment/Data: PPS 10%     Time In: 1030 Time Out: 1200 Time Total: 90 minutes  Greater than 50%  of this time was spent counseling and coordinating care related to the above assessment and plan.  Signed by: Lin Landsman, NP   Please contact Palliative Medicine Team phone at [phone] for questions and concerns.  For individual provider: See Amion  *Portions of this note are a verbal dictation therefore any spelling and/or grammatical errors are due to the "[employer]" system interpretation.

## 2022-03-05 NOTE — Sepsis Progress Note (Signed)
Sepsis protocol monitored by eLink ?

## 2022-03-05 NOTE — ED Notes (Signed)
ED TO INPATIENT HANDOFF REPORT  ED Nurse Name and Phone #: Massie Maroon RN [phone]  S Name/Age/Gender Jason [name] 77 y.o. male Room/Bed: RESUSC/RESUSC  Code Status   Code Status: DNR  Home/SNF/Other Skilled nursing facility Disoriented x4 Is this baseline? Yes   Triage Complete: Triage complete  Chief Complaint Comfort measures only status [Z51.5]  Triage Note Pt BBIB [facility] from [facility] d/t SOB & AMS staff noticed this morning with staff making rounds. He was at his baseline (per staff) at 0300, which is nonverbal but alert & "feisty." He was found to be more altered, responding to pain & sating 84% on RA, with Rhonchi lung sounds. EMS applied NRB & hos O2 rose to 94%, 34 resp, A-Fib on the monitor @ 155 bpm, CBG 179 (Hx of DM), diaphoretic, 79/53. 22g Lt wrist & 20G Lt FA, 100 cc LR given.    Allergies No Known Allergies  Level of Care/Admitting Diagnosis ED Disposition     ED Disposition  Admit   Condition  --   Comment  Hospital Area: [facility] [[id number]]  Level of Care: Med-Surg [16]  May admit patient to [name] or [facility] if equivalent level of care is available:: Yes  Covid Evaluation: Asymptomatic - no recent exposure (last 10 days) testing not required  Diagnosis: Comfort measures only status [id number]  Admitting Physician: Lucienne Minks [id number]  Attending Physician: Lucienne Minks [id number]  Certification:: I certify this patient will need inpatient services for at least 2 midnights  Estimated Length of Stay: 3          B Medical/Surgery History Past Medical History:  Diagnosis Date   Bladder mass 02-10-13   surgery planned for this   COPD (chronic obstructive pulmonary disease) ([place])    previous history and current smoking   Dementia ([place]) 02/18/2013   Goiter, toxic, 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   Past Surgical History:  Procedure Laterality Date   BALLOON DILATION N/A 04/07/2014   Procedure: BALLOON DILATION;  Surgeon: Ardis Hughs, MD;  [facility];  Service: Urology;  Laterality: N/A;   CYSTOSCOPY W/ RETROGRADES Bilateral 04/07/2014   Procedure: BILATERAL RETROGRADE PYELOGRAM;  Surgeon: Ardis Hughs, MD;  [facility];  Service: Urology;  Laterality: Bilateral;   CYSTOSCOPY/RETROGRADE/URETEROSCOPY Bilateral 02/13/2013   Procedure: BILATERAL RETROGRADE;  Surgeon: Ardis Hughs, MD;  [facility];  Service: Urology;  Laterality: Bilateral;   INGUINAL HERNIA REPAIR Right 01/30/2018   Procedure: OPEN RIGHT INGUINAL HERNIA REPAIR WITH MESH;  Surgeon: Erroll Luna, MD;  Location: [place];  Service: General;  Laterality: Right;   INGUINAL HERNIA REPAIR Left 03/07/2021   Procedure: HERNIA REPAIR INGUINAL WITH MESH;  Surgeon: Rolm Bookbinder, MD;  Location: [place];  Service: General;  Laterality: Left;   INSERTION OF MESH Left 03/07/2021   Procedure: INSERTION OF MESH;  Surgeon: Rolm Bookbinder, MD;  Location: [place];  Service: General;  Laterality: Left;   LAPAROTOMY N/A 02/18/2013   Procedure: EXPLORATORY LAPAROTOMY drainage of retroperitoneal abscess, drainage of Pre-Peritoneal abscess and Exploration of bladder perforation;  Surgeon: Imogene Burn. Georgette Dover, MD;  Location: [place];  Service: General;  Laterality: N/A;   TRANSURETHRAL RESECTION OF  BLADDER TUMOR WITH GYRUS (TURBT-GYRUS) N/A 02/13/2013   Procedure: TRANSURETHRAL RESECTION OF BLADDER TUMOR WITH GYRUS (TURBT-GYRUS), bladder biopsies;  Surgeon: Ardis Hughs, MD;  [facility];  Service: Urology;  Laterality: N/A;     A IV Location/Drains/Wounds Patient Lines/Drains/Airways Status     Active Line/Drains/Airways     Name Placement date Placement time Site Days   Peripheral IV 03/11/2022 22  G Anterior;Left Wrist 03/31/2022  0730  Wrist  less than 1   Peripheral IV 03/31/2022 20 G Anterior;Left;Proximal Forearm 03/03/2022  0741  Forearm  less than 1   Peripheral IV 03/28/2022 18 G Anterior;Proximal;Right Forearm 03/26/2022  0800  Forearm  less than 1            Intake/Output Last 24 hours No intake or output data in the 24 hours ending 03/06/2022 1123  Labs/Imaging Results for orders placed or performed during the hospital encounter of 03/29/2022 (from the past 48 hour(s))  MRSA Next Gen by PCR, Nasal     Status: Abnormal   Collection Time: 03/18/2022  7:29 AM   Specimen: Nasal Mucosa; Nasal Swab  Result Value Ref Range   MRSA by PCR Next Gen DETECTED (A) NOT DETECTED    Comment: RESULT CALLED TO, READ BACK BY AND VERIFIED WITH: P. Ally Knodel RN, AT [id number] 03/25/2022 D. VANHOOK (NOTE) The GeneXpert MRSA Assay (FDA approved for NASAL specimens only), is one component of a comprehensive MRSA colonization surveillance program. It is not intended to diagnose MRSA infection nor to guide or monitor treatment for MRSA infections. Test performance is not FDA approved in patients less than 31 years old. Performed at [facility], [place] [address], [place], [place] [address]   Lactic acid, plasma     Status: Abnormal   Collection Time: 03/17/2022  7:58 AM  Result Value Ref Range   Lactic Acid, Venous 6.0 (HH) 0.5 - 1.9 mmol/L    Comment: CRITICAL RESULT CALLED TO, READ BACK BY AND VERIFIED WITH PAIGE Jerolyn Flenniken RN.'@0845'$  ON 3.4.24 BY TCALDWELL MT. Performed at [facility], [place] [address], [place], [place] [address]   Comprehensive metabolic panel     Status: Abnormal   Collection Time: 03/12/2022  8:08 AM  Result Value Ref Range   Sodium 140 135 - 145 mmol/L   Potassium 4.1 3.5 - 5.1 mmol/L   Chloride 103 98 - 111 mmol/L   CO2 16 (L) 22 - 32 mmol/L   Glucose, Bld 156 (H) 70 - 99 mg/dL    Comment: Glucose reference range applies only to samples taken after fasting for at least 8  hours.   BUN 72 (H) 8 - 23 mg/dL   Creatinine, Ser 4.62 (H) 0.61 - 1.24 mg/dL   Calcium 9.1 8.9 - 10.3 mg/dL   Total Protein 8.8 (H) 6.5 - 8.1 g/dL   Albumin 2.6 (L) 3.5 - 5.0 g/dL   AST 30 15 - 41 U/L   ALT 16 0 - 44 U/L   Alkaline Phosphatase 155 (H) 38 - 126 U/L   Total Bilirubin 1.6 (H) 0.3 - 1.2 mg/dL   GFR, Estimated 12 (L) >60 mL/min    Comment: (NOTE) Calculated using the CKD-EPI Creatinine Equation (2021)    Anion gap 21 (H) 5 - 15    Comment: ELECTROLYTES REPEATED TO VERIFY Performed at [facility], [address], [place], [place] [address]   CBC with Differential     Status: Abnormal   Collection Time: 03/17/2022  8:08 AM  Result Value Ref Range   WBC 38.6 (H) 4.0 - 10.5 K/uL   RBC 6.39 (H) 4.22 - 5.81 MIL/uL   Hemoglobin 12.9 (L) 13.0 - 17.0 g/dL   HCT 41.3 39.0 - 52.0 %   MCV 64.6 (L) 80.0 - 100.0 fL   MCH 20.2 (L) 26.0 - 34.0 pg   MCHC 31.2 30.0 - 36.0 g/dL   RDW 20.4 (H) 11.5 - 15.5 %   Platelets 311 150 - 400 K/uL    Comment: REPEATED TO VERIFY   nRBC 0.0 0.0 - 0.2 %   Neutrophils Relative % 99 %   Neutro Abs 38.2 (H) 1.7 - 7.7 K/uL   Lymphocytes Relative 0 %   Lymphs Abs 0.0 (L) 0.7 - 4.0 K/uL   Monocytes Relative 1 %   Monocytes Absolute 0.4 0.1 - 1.0 K/uL   Eosinophils Relative 0 %   Eosinophils Absolute 0.0 0.0 - 0.5 K/uL   Basophils Relative 0 %   Basophils Absolute 0.0 0.0 - 0.1 K/uL   nRBC 0 0 /100 WBC   Abs Immature Granulocytes 0.00 0.00 - 0.07 K/uL   Polychromasia PRESENT     Comment: Performed at [facility], [address], [place], [place] [address]  Lactic acid, plasma     Status: Abnormal   Collection Time: 03/02/2022  9:56 AM  Result Value Ref Range   Lactic Acid, Venous 5.7 (HH) 0.5 - 1.9 mmol/L    Comment: CRITICAL VALUE NOTED. VALUE IS CONSISTENT WITH PREVIOUSLY REPORTED/CALLED VALUE Performed at [facility], [place] [address], [place], [place] [address]    CT Head Wo Contrast  Result Date: 03/26/2022 CLINICAL  DATA:  Altered mental status, unknown cause. EXAM: CT HEAD WITHOUT CONTRAST TECHNIQUE: Contiguous axial images were obtained from the base of the skull through the vertex without intravenous contrast. RADIATION DOSE REDUCTION: This exam was performed according to the departmental dose-optimization program which includes automated exposure control, adjustment of the mA and/or kV according to patient size and/or use of iterative reconstruction technique. COMPARISON:  Head CT 10/02/2021. FINDINGS: Brain: No acute intracranial hemorrhage. 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 Old lacunar infarcts in the bilateral thalami and left corona radiata. No hydrocephalus or extra-axial collection. Vascular: No hyperdense vessel or unexpected calcification. Skull: No calvarial fracture or suspicious bone lesion. Skull base is unremarkable. Sinuses/Orbits: Trace fluid in the right sphenoid and left maxillary sinuses. Mastoids are well aerated. Orbits are unremarkable. Other: None. IMPRESSION: 1. No acute intracranial hemorrhage. 2. 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 Electronically Signed   By: Emmit Alexanders M.D.   On: 03/13/2022 08:47   DG Chest Port 1 View  Result Date: 03/04/2022 CLINICAL DATA:  Possible sepsis EXAM: PORTABLE CHEST 1 VIEW COMPARISON:  CXR 03/16/21, CXR 10/02/21 FINDINGS: Redemonstrated is a moderate right-sided pleural effusion which is new compared to 03/16/2021, and unchanged compared to 10/02/2021. No pneumothorax. Redemonstrated are patchy airspace opacities in the bilateral lungs, with likely at least slight interval progression the right lung apex. Cardiac and mediastinal contours are unchanged. No radiographically displaced rib fractures. Visualized upper abdomen is unremarkable. IMPRESSION: 1. Redemonstrated moderate  right-sided pleural effusion which is new compared to 03/16/2021, and unchanged compared to 10/02/2021. 2. Redemonstrated are patchy airspace opacities in the bilateral lungs, with likely at least slight interval progression at the right lung  apex. Electronically Signed   By: Marin Roberts M.D.   On: 04/01/2022 08:00    Pending Labs Unresulted Labs (From admission, onward)     Start     Ordered   03/06/2022 0721  Resp panel by RT-PCR (RSV, Flu A&B, Covid)  (Septic presentation on arrival (screening labs, nursing and treatment orders for obvious sepsis))  Once,   URGENT        03/06/2022 0723   03/15/2022 0721  Blood Culture (routine x 2)  (Septic presentation on arrival (screening labs, nursing and treatment orders for obvious sepsis))  BLOOD CULTURE X 2,   STAT      03/16/2022 0723   03/16/2022 0721  Urinalysis, Routine w reflex microscopic -Urine, Catheterized  (Septic presentation on arrival (screening labs, nursing and treatment orders for obvious sepsis))  ONCE - URGENT,   URGENT       Question:  Specimen Source  Answer:  Urine, Catheterized   03/27/2022 0723            Vitals/Pain Today's Vitals   03/04/2022 1038 03/13/2022 1045 03/18/2022 1100 03/04/2022 1115  BP:  (!) 86/60 97/64 (!) 84/60  Pulse: (!) 127 (!) 119 (!) 124 (!) 117  Resp: (!) 27 (!) 21 (!) 24 (!) 22  Temp:      TempSrc:      SpO2: 98% 97% 98% 99%    Isolation Precautions No active isolations  Medications Medications  0.9 %  sodium chloride infusion (0 mLs Intravenous Hold 03/09/2022 1039)  morphine (PF) 2 MG/ML injection 1 mg (has no administration in time range)  LORazepam (ATIVAN) injection 1 mg (has no administration in time range)  scopolamine (TRANSDERM-SCOP) 1 MG/3DAYS 1.5 mg (has no administration in time range)  lactated ringers bolus 1,000 mL (0 mLs Intravenous Stopped 03/08/2022 0845)    And  lactated ringers bolus 1,000 mL (0 mLs Intravenous Stopped 03/11/2022 0931)  vancomycin (VANCOCIN) IVPB 1000 mg/200 mL premix (0 mg  Intravenous Stopped 03/30/2022 0953)  meropenem (MERREM) 1 g in sodium chloride 0.9 % 100 mL IVPB (0 g Intravenous Stopped 03/06/2022 0845)  lactated ringers bolus 1,000 mL (0 mLs Intravenous Stopped 03/03/2022 1035)  acetaminophen (TYLENOL) suppository 650 mg (650 mg Rectal Given 03/19/2022 1032)    Mobility non-ambulatory     Focused Assessments Pulmonary Assessment Handoff:  Lung sounds:    Rhonchi      R Recommendations: See Admitting Provider Note  Report given to: 2W21  Additional Notes:  Sepsis work up before starting comfort measures, still wearing NRB, family at bedside & aware of plan, DNR at bedside.

## 2022-03-05 NOTE — H&P (Signed)
History and Physical    PatientESMOND [name] [id number] DOB: 1945/07/19 DOA: 03/26/2022 DOS: the patient was seen and examined on 03/25/2022 PCP: Janifer Adie, MD (Inactive)  Patient coming from:  [facility]  Chief Complaint:  Chief Complaint  Patient presents with   SOB   AMS   HPI: Jason [name] is a 77 y.o. male with medical history significant for afib (not on [facility]), bladder cancer, diabetes who presented with SOB and AMS from his facility.  Staff reports that he was tachypneic with sat's in the 51% range. He was placed on 5L with improvement to 81%.  Pt was started on IV fluids and IV antibx's. However, the ER provider advised that the pts guardian Macky Lower was made aware of the pt's current condition and opted for comfort measures). Pt is severely ill at this time with sepsis from a pneumonia /moderate R pleural effusion on CXR.   Caren Griffins (the guardian) also made it clear that DSS had the final say regarding the need for comfort measures for Mr. Relyea.  Spoke with the pt's daughter at the bedside in the ER on 03/31/2022 at 11:30 AM. She was explained that the pt is critically ill right now. She was also clearly advised what Caren Griffins (the guardian) advised regarding proceeding with comfort measures. The daughter advised to keep him comfortable and to proceed with comfort measures.   Review of Systems: As mentioned in the history of present illness. All other systems reviewed and are negative. 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 04-01-14 all resolved    Urothelial carcinoma ([place])  02/18/2013   Past Surgical History:  Procedure Laterality Date   BALLOON DILATION N/A 04/07/2014   Procedure: BALLOON DILATION;  Surgeon: Ardis Hughs, MD;  [facility];  Service: Urology;  Laterality: N/A;   CYSTOSCOPY W/ RETROGRADES Bilateral 04/07/2014   Procedure: BILATERAL RETROGRADE PYELOGRAM;  Surgeon: Ardis Hughs, MD;  [facility];  Service: Urology;  Laterality: Bilateral;   CYSTOSCOPY/RETROGRADE/URETEROSCOPY Bilateral 02/13/2013   Procedure: BILATERAL RETROGRADE;  Surgeon: Ardis Hughs, MD;  [facility];  Service: Urology;  Laterality: Bilateral;   INGUINAL HERNIA REPAIR Right 01/30/2018   Procedure: OPEN RIGHT INGUINAL HERNIA REPAIR WITH MESH;  Surgeon: Erroll Luna, MD;  Location: [place];  Service: General;  Laterality: Right;   INGUINAL HERNIA REPAIR Left 03/07/2021   Procedure: HERNIA REPAIR INGUINAL WITH MESH;  Surgeon: Rolm Bookbinder, MD;  Location: [place];  Service: General;  Laterality: Left;   INSERTION OF MESH Left 03/07/2021   Procedure: INSERTION OF MESH;  Surgeon: Rolm Bookbinder, MD;  Location: [place];  Service: General;  Laterality: Left;   LAPAROTOMY N/A 02/18/2013   Procedure: EXPLORATORY LAPAROTOMY drainage of retroperitoneal abscess, drainage of Pre-Peritoneal abscess and Exploration of bladder perforation;  Surgeon: Imogene Burn. Georgette Dover, MD;  Location: [place];  Service: General;  Laterality: N/A;   TRANSURETHRAL RESECTION OF BLADDER TUMOR WITH GYRUS (TURBT-GYRUS) N/A 02/13/2013   Procedure: TRANSURETHRAL RESECTION OF BLADDER TUMOR WITH GYRUS (TURBT-GYRUS), bladder biopsies;  Surgeon:  Ardis Hughs, MD;  [facility];  Service: Urology;  Laterality: N/A;   Social History:  reports that he has been smoking cigarettes. He has been smoking an average of 1 pack per day. He has never used smokeless tobacco. He reports current alcohol use. He reports that he does not use drugs.  No Known Allergies  History reviewed. No pertinent family  history.  Prior to Admission medications   Medication Sig Start Date End Date Taking? Authorizing Provider  acetaminophen (TYLENOL) 325 MG tablet Take 2 tablets (650 mg total) by mouth every 6 (six) hours as needed for mild pain (or Fever >/= 101). 10/04/21  Yes Sheikh, Omair Latif, DO  Morphine Sulfate (MORPHINE CONCENTRATE) 10 MG/0.5ML SOLN concentrated solution Take 0.25 mLs (5 mg total) by mouth every hour as needed for shortness of breath or moderate pain. 10/04/21  Yes Sheikh, Omair Latif, DO  ondansetron (ZOFRAN-ODT) 4 MG disintegrating tablet Take 1 tablet (4 mg total) by mouth every 6 (six) hours as needed for nausea. 10/04/21  Yes Raiford Noble [place], [place]    Physical Exam: Vitals:   03/22/2022 1038 03/11/2022 1045 03/26/2022 1100 03/27/2022 1115  BP:  (!) 86/60 97/64 (!) 84/60  Pulse: (!) 127 (!) 119 (!) 124 (!) 117  Resp: (!) 27 (!) 21 (!) 24 (!) 22  Temp:      TempSrc:      SpO2: 98% 97% 98% 99%   Physical Exam Constitutional:      Comments: Drowsy & lethargic   HENT:     Head: Normocephalic.     Mouth/Throat:     Mouth: Mucous membranes are dry.  Cardiovascular:     Rate and Rhythm: Tachycardia present.  Pulmonary:     Breath sounds: Rhonchi and rales present.  Abdominal:     General: Abdomen is flat.     Palpations: Abdomen is soft.  Musculoskeletal:     Cervical back: Neck supple.     Comments: Contracted   Skin:    General: Skin is warm.  Neurological:     Comments: Unable to ascertain   Psychiatric:     Comments: Unable to ascertain      Data Reviewed:  There are no new results to review at this time.  Assessment and Plan: Need for comfort measure - IV morphine 1 mg q1 hr PRN  - IV ativan 1 mg q1 hr PRN  - Scopolamine patch 1.5 mg q72 hr   DVT prophylaxis: Avoid pharmacologic anti-coagulation as pt is comfort measures    Advance Care Planning:   Code Status: DNR   Severity of Illness: The appropriate patient status for this patient is INPATIENT.  Inpatient status is judged to be reasonable and necessary in order to provide the required intensity of service to ensure the patient's safety. The patient's presenting symptoms, physical exam findings, and initial radiographic and laboratory data in the context of their chronic comorbidities is felt to place them at high risk for further clinical deterioration. Furthermore, it is not anticipated that the patient will be medically stable for discharge from the hospital within 2 midnights of admission.   * I certify that at the point of admission it is my clinical judgment that the patient will require inpatient hospital care spanning beyond 2 midnights from the point of admission due to high intensity of service, high risk for further deterioration and high frequency of surveillance required.*  Author: Lucienne Minks , MD 03/28/2022 11:21 AM  For on call  review [email or web address].

## 2022-03-05 NOTE — ED Triage Notes (Signed)
Pt [facility]IB [facility] from [facility] d/t SOB & AMS staff noticed this morning with staff making rounds. He was at his baseline (per staff) at 0300, which is nonverbal but alert & "feisty." He was found to be more altered, responding to pain & sating 84% on RA, with Rhonchi lung sounds. EMS applied NRB & hos O2 rose to 94%, 34 resp, A-Fib on the monitor @ 155 bpm, CBG 179 (Hx of DM), diaphoretic, 79/53. 22g Lt wrist & 20G Lt FA, 100 cc LR given.

## 2022-03-05 NOTE — ED Notes (Signed)
Bladder scanner showed 575 cc urine in bladder, Coude was used on 3rd attempt to I&O, EDP made aware it was unsuccessful again.

## 2022-03-05 NOTE — ED Notes (Signed)
Unable to get urine via I&O x2 attempts, EDP made aware.

## 2022-03-06 DIAGNOSIS — A419 Sepsis, unspecified organism: Secondary | ICD-10-CM

## 2022-03-06 DIAGNOSIS — R339 Retention of urine, unspecified: Secondary | ICD-10-CM

## 2022-03-06 DIAGNOSIS — J189 Pneumonia, unspecified organism: Secondary | ICD-10-CM | POA: Diagnosis not present

## 2022-03-06 DIAGNOSIS — R6521 Severe sepsis with septic shock: Secondary | ICD-10-CM

## 2022-03-06 DIAGNOSIS — Z515 Encounter for palliative care: Secondary | ICD-10-CM

## 2022-03-06 DIAGNOSIS — Z8659 Personal history of other mental and behavioral disorders: Secondary | ICD-10-CM | POA: Diagnosis not present

## 2022-03-06 LAB — BLOOD CULTURE ID PANEL (REFLEXED) - BCID2

## 2022-03-06 MED ORDER — SODIUM CHLORIDE 0.9 % IV SOLN
0.5000 mg/h | INTRAVENOUS | Status: DC
  Administered 2022-03-06: 0.5 mg/h via INTRAVENOUS
  Filled 2022-03-06: qty 5

## 2022-03-06 MED ORDER — GLYCOPYRROLATE 0.2 MG/ML IJ SOLN
0.4000 mg | INTRAMUSCULAR | Status: DC
  Administered 2022-03-06 – 2022-03-07 (×7): 0.4 mg via INTRAVENOUS
  Filled 2022-03-06 (×8): qty 2

## 2022-03-06 MED ORDER — HYDROMORPHONE BOLUS VIA INFUSION: 0.5000 mg | INTRAVENOUS | Status: DC | PRN

## 2022-03-06 NOTE — Progress Notes (Signed)
PHARMACY - PHYSICIAN COMMUNICATION CRITICAL VALUE ALERT - BLOOD CULTURE IDENTIFICATION (BCID)  Jason [name] is an 77 y.o. male who presented to [facility] on 03/24/2022 with a chief complaint of SOB and AMS.  Assessment:  Initially started on broad-spectrum ABX for PNA and sepsis; family decided to proceed with comfort measures for EOL; blood cx growing Proteus spp in 2 of 4 bottles.  Name of physician (or Provider) Contacted: PShah DO  Current antibiotics: None  Changes to prescribed antibiotics recommended:  No changes needed for comfort care; if care is escalated would recommend ceftriaxone 2g IV Q24H.  Results for orders placed or performed during the hospital encounter of 04/01/2022  Blood Culture ID Panel (Reflexed) (Collected: 04/01/2022  7:45 AM)  Result Value Ref Range   Enterococcus faecalis NOT DETECTED NOT DETECTED   Enterococcus Faecium NOT DETECTED NOT DETECTED   Listeria monocytogenes NOT DETECTED NOT DETECTED   Staphylococcus species NOT DETECTED NOT DETECTED   Staphylococcus aureus (BCID) NOT DETECTED NOT DETECTED   Staphylococcus epidermidis NOT DETECTED NOT DETECTED   Staphylococcus lugdunensis NOT DETECTED NOT DETECTED   Streptococcus species NOT DETECTED NOT DETECTED   Streptococcus agalactiae NOT DETECTED NOT DETECTED   Streptococcus pneumoniae NOT DETECTED NOT DETECTED   Streptococcus pyogenes NOT DETECTED NOT DETECTED   A.calcoaceticus-baumannii NOT DETECTED NOT DETECTED   Bacteroides fragilis NOT DETECTED NOT DETECTED   Enterobacterales DETECTED (A) NOT DETECTED   Enterobacter cloacae complex NOT DETECTED NOT DETECTED   Escherichia coli NOT DETECTED NOT DETECTED   Klebsiella aerogenes NOT DETECTED NOT DETECTED   Klebsiella oxytoca NOT DETECTED NOT DETECTED   Klebsiella pneumoniae NOT DETECTED NOT DETECTED   Proteus species DETECTED (A) NOT DETECTED   Salmonella species NOT DETECTED NOT DETECTED   Serratia marcescens NOT DETECTED NOT DETECTED   Haemophilus  influenzae NOT DETECTED NOT DETECTED   Neisseria meningitidis NOT DETECTED NOT DETECTED   Pseudomonas aeruginosa NOT DETECTED NOT DETECTED   Stenotrophomonas maltophilia NOT DETECTED NOT DETECTED   Candida albicans NOT DETECTED NOT DETECTED   Candida auris NOT DETECTED NOT DETECTED   Candida glabrata NOT DETECTED NOT DETECTED   Candida krusei NOT DETECTED NOT DETECTED   Candida parapsilosis NOT DETECTED NOT DETECTED   Candida tropicalis NOT DETECTED NOT DETECTED   Cryptococcus neoformans/gattii NOT DETECTED NOT DETECTED   CTX-M ESBL NOT DETECTED NOT DETECTED   Carbapenem resistance IMP NOT DETECTED NOT DETECTED   Carbapenem resistance KPC NOT DETECTED NOT DETECTED   Carbapenem resistance NDM NOT DETECTED NOT DETECTED   Carbapenem resist OXA 48 LIKE NOT DETECTED NOT DETECTED   Carbapenem resistance VIM NOT DETECTED NOT DETECTED    Wynona Neat, PharmD, BCPS  03/06/2022  7:14 AM

## 2022-03-06 NOTE — Progress Notes (Addendum)
Daily Progress Note   Patient Name: Jason [name]       Date: 03/06/2022 DOB: 10-20-1945  Age: 77 y.o. MRN#: [id number] Attending Physician: Jason Goldmann, DO Primary Care Physician: Jason Adie, MD (Inactive) Admit Date: 03/29/2022  Reason for Consultation/Follow-up: Non pain symptom management, Pain control, Psychosocial/spiritual support, and Terminal Care  Subjective: I have reviewed medical records including EPIC notes and labs. Unable to receive report from primary RN.  Discussed case in detail with Dr. Manuella [name] and Jason Bal, LCSW from [facility] outside patient's room. Discussed weather pursuing suprapubic catheter at this point would be appropriate. As patient does not show signs of discomfort, plan is to wean oxygen to room air today, and he is spontaneously voiding, with consideration of short life expectancy, will hold off on suprapubic placement. Discussed symptom management plan for initiation of dilaudid drip in anticipation of oxygen being weaned and tenuous respiratory status due to infection.   Went to visit patient at bedside - daughter/Jason [name] and Jason Bal, LCSW present. Patient was lying in bed unresponsive. No signs or non-verbal gestures of pain or discomfort noted. No respiratory distress or increased work of breathing; secretions audible without stethoscope. He is on 2L O2 [place].  Emotional support provided to daughter. Natural expectations at EOL reviewed. Informed her of symptom management plan to initiate dilaudid drip and wean to room air. Medication education provided. Reviewed why suprapubic cath will not be pursued today. Education provided on what a suprapubic catheter is per her request. Prognosis reviewed.   All questions and concerns addressed. Encouraged to call with questions  and/or concerns. PMT card previously provided.  Discussed symptom mgmt plan with RN.   Length of Stay: 1  Current Medications: Scheduled Meds:   antiseptic oral rinse  15 mL Topical BID    HYDROmorphone (DILAUDID) injection  1 mg Intravenous Q4H    Continuous Infusions:   PRN Meds: acetaminophen **OR** acetaminophen, diphenhydrAMINE, glycopyrrolate **OR** glycopyrrolate **OR** glycopyrrolate, haloperidol **OR** haloperidol **OR** haloperidol lactate, HYDROmorphone (DILAUDID) injection, LORazepam **OR** LORazepam, ondansetron **OR** ondansetron (ZOFRAN) IV, polyvinyl alcohol  Physical Exam Vitals and nursing note reviewed.  Constitutional:      General: He is not in acute distress.    Appearance: He is ill-appearing.  Pulmonary:     Effort: No respiratory distress.     Comments: Respiratory secretions Skin:  General: Skin is warm and dry.  Neurological:     Mental Status: He is unresponsive.     Motor: Weakness present.  Psychiatric:        Speech: He is noncommunicative.             Vital Signs: BP 112/65 (BP Location: Right Arm)   Pulse (!) 124   Temp 99.2 F (37.3 C) (Oral)   Resp 16   SpO2 94%  SpO2: SpO2: 94 % O2 Device: O2 Device: Nasal Cannula O2 Flow Rate: O2 Flow Rate (L/min): 2 L/min  Intake/output summary:  Intake/Output Summary (Last 24 hours) at 03/06/2022 1054 Last data filed at 03/06/2022 0600 Gross per 24 hour  Intake 3.37 ml  Output --  Net 3.37 ml   LBM: Last BM Date : 03/21/2022 Baseline Weight:   Most recent weight:         Palliative Assessment/Data: PPS 10%      Patient Active Problem List   Diagnosis Date Noted   Hypomagnesemia 10/03/2021   Hypophosphatemia 10/03/2021   Subclinical hyperthyroidism 10/03/2021   Advanced care planning/counseling discussion    Goals of care, counseling/discussion    Severe sepsis ([place]) 0000000   Acute metabolic encephalopathy 0000000   UTI (urinary tract infection) 10/02/2021   DM2  (diabetes mellitus, type 2) ([place]) 10/02/2021   Palliative care status 03/17/2021   Comfort measures only status 03/17/2021   Anemia 03/17/2021   Atrial fibrillation, transient ([place]) 03/17/2021   Aspiration into respiratory tract    Malnutrition of moderate degree 03/03/2021   Incarcerated hernia    Pressure injury of skin 01/30/2018   Small bowel obstruction ([place]) 01/27/2018   Inguinal hernia 01/27/2018   Acute esophagitis 01/27/2018   Hematemesis 01/27/2018   Pneumonia 12/15/2015   Acute encephalopathy 03/07/2013   Postoperative intra-abdominal abscess 03/07/2013   Hydronephrosis, bilateral 03/07/2013   Pressure ulcer, stage 3 ([place]) 03/07/2013   Septic shock ([place]) 02/18/2013   Aspiration pneumonia ([place]) 02/18/2013   Acute hypoxemic respiratory failure ([place]) 02/18/2013   AKI (acute kidney injury) ([place]) 02/18/2013   Dementia ([place]) 02/18/2013   Hematuria 02/18/2013   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 male  with past medical history of afib (not on [facility]), bladder cancer, diabetes, dementia, stroke, COPD presented to ED on 03/02/2022 from [facility] with concerns of shortness of breath and AMS. Patient was evaluated in ED and found to be severely ill with sepsis from a pneumonia /moderate R pleural effusion. After EDP spoke with legal guardian, goals were changed to comfort measures only status and PMT was consulted to assist with symptom management. Patient  admitted on 03/10/2022 for end of life care.   Assessment: Principal Problem:   Comfort measures only status   SUMMARY OF RECOMMENDATIONS   Continue full comfort measures Continue DNR/DNI as  previously documented Patient is likely not stable for transfer at this time - anticipate hospital death Wean to room air Comfort focused medication regimen adjusted as noted below Would hold off on suprapubic catheter placement at this time Nursing to provide frequent assessments and administer PRN medications as clinically necessary to ensure EOL comfort PMT will  continue to follow and support holistically   Symptom Management Initiated continuous dilaudid infusion; PRN doses for breakthrough symptoms Scheduled robinul q4h; PRN doses for breakthrough symptoms Tylenol PRN pain/fever Biotin twice daily Benadryl PRN itching Haldol PRN agitation/delirium Ativan PRN anxiety/seizure/sleep/distress Zofran PRN nausea/vomiting Liquifilm Tears PRN dry eye  Goals of Care and Additional Recommendations: Limitations on Scope of Treatment: Full Comfort Care  Code Status:    Code Status Orders  (From admission, onward)           Start     Ordered   03/04/2022 1152  Do not attempt resuscitation (DNR)  Continuous       Question Answer Comment  If patient has no pulse and is not breathing Do Not Attempt Resuscitation   If patient has a pulse and/or is breathing: Medical Treatment Goals COMFORT MEASURES: Keep clean/warm/dry, use medication by any route; positioning, wound care and other measures to relieve pain/suffering; use oxygen, suction/manual treatment of airway obstruction for comfort; do not transfer unless for comfort needs.   Consent: Discussion documented in EHR or advanced directives reviewed      03/03/2022 1153           Code Status History     Date Active Date Inactive Code Status Order ID Comments User Context   03/15/2022 0723 03/25/2022 1153 DNR [id number]  Fransico Meadow, MD ED   10/04/2021 1229 10/04/2021 2157 DNR [id number]  Earlie Counts, NP Inpatient   10/03/2021 0335 10/04/2021 1229 DNR [id number]  Toy Baker, MD ED   03/17/2021 1505 03/20/2021 2229 DNR  [id number]  Philis Pique, NP Inpatient   03/17/2021 1455 03/17/2021 1505 DNR [id number]  Gaylan Gerold, DO Inpatient   03/02/2021 0649 03/17/2021 1455 Full Code [id number]  Corey Harold, NP ED   01/27/2018 0720 01/31/2018 1458 Full Code [id number]  Valinda Party, DO Inpatient   12/13/2015 1701 12/15/2015 2340 Full Code [id number]  Juliet Rude, MD Inpatient   03/07/2013 1444 03/11/2013 1650 Full Code [id number]  Azzie Roup, MD Inpatient   03/07/2013 0519 03/07/2013 1444 Full Code [id number]  Theressa Millard, MD Inpatient   02/18/2013 1014 03/02/2013 2036 Full Code [id number]  Samuella Cota, MD Inpatient      Advance Directive Documentation    Flowsheet Row Most Recent Value  Type of Advance Directive Out of facility DNR (pink MOST or yellow form)  Pre-existing out of facility DNR order (yellow form or pink MOST form) Pink Most/Yellow Form available - Physician notified to receive inpatient order  "MOST" Form in Place? --       Prognosis:  Hours - Days  Discharge Planning: Anticipated Hospital Death  Care plan was discussed with Dr. Manuella Ghazi, LCSW from [place], primary RN  Thank you for allowing the Palliative Medicine Team to assist in the care of this patient.   Lin Landsman, NP  Please contact Palliative Medicine Team phone at [phone] for questions and concerns.   *Portions of this note are a verbal dictation therefore any spelling and/or grammatical errors are due to the "[place] [employer]" system interpretation.

## 2022-03-06 NOTE — Progress Notes (Addendum)
PROGRESS NOTE    Jason [name]  [id number] DOB: 05/13/1945 DOA: 03/12/2022 PCP: Janifer Adie, MD (Inactive)   Brief Narrative:    Jason [name] is a 77 y.o. male with medical history significant for afib (not on [facility]), bladder cancer, diabetes who presented with SOB and AMS from his facility.  Staff reports that he was tachypneic with sat's in the 51% range.  Patient was started on IV fluids and IV antibiotics due to sepsis secondary to pneumonia with moderate right-sided pleural effusion.  Legal guardian was contacted who opted for comfort measures.  Patient did have significant urinary retention with noted dense bulbar urethral stricture and history of bladder cancer and urology could not place catheter.  Hold off on suprapubic catheter placement and agree with palliative to continue comfort measures with additional pain medications as ordered.  Assessment & Plan:   Principal Problem:   Comfort measures only status  Assessment and Plan:  Acute hypoxemic respiratory failure secondary to sepsis, present on admission secondary to pneumonia with associated right sided pleural effusion -Now with Proteus bacteremia, holding antibiotics as patient is comfort care  Acute urinary retention in the setting of bladder cancer and bulbar urethral stricture -Hold off on suprapubic catheter and agree with palliative medicine regarding need for further pain management.  History of atrial fibrillation  History of diabetes  Patient currently on comfort measures with anticipated in-hospital death.  DVT prophylaxis: None Code Status: DNR/comfort care Family Communication: Discussed with legal guardian 3/5  Disposition Plan: Anticipated in-hospital death. Status is: Inpatient Remains inpatient appropriate because: Need for IV medications.   Consultants:  Urology Palliative care  Procedures:  None  Antimicrobials:  Anti-infectives (From admission, onward)    Start     Dose/Rate Route  Frequency Ordered Stop   03/06/22 0800  meropenem (MERREM) 1 g in sodium chloride 0.9 % 100 mL IVPB  Status:  Discontinued        1 g 200 mL/hr over 30 Minutes Intravenous Every 24 hours 03/04/2022 0914 03/03/2022 1120   03/25/2022 0908  vancomycin variable dose per unstable renal function (pharmacist dosing)  Status:  Discontinued         Does not apply See admin instructions 03/06/2022 0908 03/15/2022 1120   03/27/2022 0730  vancomycin (VANCOCIN) IVPB 1000 mg/200 mL premix        1,000 mg 200 mL/hr over 60 Minutes Intravenous  Once 03/30/2022 0723 03/02/2022 0953   03/21/2022 0730  meropenem (MERREM) 1 g in sodium chloride 0.9 % 100 mL IVPB        1 g 200 mL/hr over 30 Minutes Intravenous  Once 03/06/2022 0723 03/28/2022 0845      Subjective: Patient seen and evaluated today and has not had much if any, urine output overnight.  Poorly responsive to questioning.  Objective: Vitals:   03/25/2022 1200 03/04/2022 1229 03/04/2022 1457 03/08/2022 1942  BP: (!) 122/91 (!) 91/56  112/66  Pulse: (!) 122 (!) 115  (!) 106  Resp: (!) 29   16  Temp:  (!) 97.5 F (36.4 C)  97.8 F (36.6 C)  TempSrc:  Oral    SpO2: 100% 100% 99% 99%    Intake/Output Summary (Last 24 hours) at 03/06/2022 0631 Last data filed at 03/06/2022 0600 Gross per 24 hour  Intake 3.37 ml  Output --  Net 3.37 ml   There were no vitals filed for this visit.  Examination:  General exam: Appears calm and comfortable  Respiratory system: Bilateral rales and  significant congestion noted.  2 L nasal cannula Cardiovascular system: S1 & S2 heard, RRR.  Gastrointestinal system: Suprapubic area is tense and tender to palpation Central nervous system: Alert and awake Extremities: No edema Skin: No significant lesions noted Psychiatry: Flat affect.    Data Reviewed: I have personally reviewed following labs and imaging studies  CBC: Recent Labs  Lab 03/02/2022 0808  WBC 38.6*  NEUTROABS 38.2*  HGB 12.9*  HCT 41.3  MCV 64.6*  PLT [id number]   Basic  Metabolic Panel: Recent Labs  Lab 03/26/2022 0808  NA 140  K 4.1  CL 103  CO2 16*  GLUCOSE 156*  BUN 72*  CREATININE 4.62*  CALCIUM 9.1   GFR: CrCl cannot be calculated (Unknown ideal weight.). Liver Function Tests: Recent Labs  Lab 03/09/2022 0808  AST 30  ALT 16  ALKPHOS 155*  BILITOT 1.6*  PROT 8.8*  ALBUMIN 2.6*   No results for input(s): "LIPASE", "AMYLASE" in the last 168 hours. No results for input(s): "AMMONIA" in the last 168 hours. Coagulation Profile: No results for input(s): "INR", "PROTIME" in the last 168 hours. Cardiac Enzymes: No results for input(s): "CKTOTAL", "CKMB", "CKMBINDEX", "TROPONINI" in the last 168 hours. BNP (last 3 results) No results for input(s): "PROBNP" in the last 8760 hours. HbA1C: No results for input(s): "HGBA1C" in the last 72 hours. CBG: No results for input(s): "GLUCAP" in the last 168 hours. Lipid Profile: No results for input(s): "CHOL", "HDL", "LDLCALC", "TRIG", "CHOLHDL", "LDLDIRECT" in the last 72 hours. Thyroid Function Tests: No results for input(s): "TSH", "T4TOTAL", "FREET4", "T3FREE", "THYROIDAB" in the last 72 hours. Anemia Panel: No results for input(s): "VITAMINB12", "FOLATE", "FERRITIN", "TIBC", "IRON", "RETICCTPCT" in the last 72 hours. Sepsis Labs: Recent Labs  Lab 03/16/2022 0758 03/19/2022 0956  LATICACIDVEN 6.0* 5.7*    Recent Results (from the past 240 hour(s))  Resp panel by RT-PCR (RSV, Flu A&B, Covid)     Status: None   Collection Time: 03/28/2022  7:21 AM   Specimen: Nasal Swab  Result Value Ref Range Status   SARS Coronavirus 2 by RT PCR NEGATIVE NEGATIVE Final   Influenza A by PCR NEGATIVE NEGATIVE Final   Influenza B by PCR NEGATIVE NEGATIVE Final    Comment: (NOTE) The Xpert Xpress SARS-CoV-2/FLU/RSV plus assay is intended as an aid in the diagnosis of influenza from Nasopharyngeal swab specimens and should not be used as a sole basis for treatment. Nasal washings and aspirates are unacceptable  for Xpert Xpress SARS-CoV-2/FLU/RSV testing.  Fact Sheet for Patients: EntrepreneurPulse.com.au  Fact Sheet for Healthcare Providers: IncredibleEmployment.be  This test is not yet approved or cleared by the [place] FDA and has been authorized for detection and/or diagnosis of SARS-CoV-2 by FDA under an Emergency Use Authorization (EUA). This EUA will remain in effect (meaning this test can be used) for the duration of the COVID-19 declaration under Section 564(b)(1) of the Act, 21 U.S.C. section 360bbb-3(b)(1), unless the authorization is terminated or revoked.     Resp Syncytial Virus by PCR NEGATIVE NEGATIVE Final    Comment: (NOTE) Fact Sheet for Patients: EntrepreneurPulse.com.au  Fact Sheet for Healthcare Providers: IncredibleEmployment.be  This test is not yet approved or cleared by the [place] FDA and has been authorized for detection and/or diagnosis of SARS-CoV-2 by FDA under an Emergency Use Authorization (EUA). This EUA will remain in effect (meaning this test can be used) for the duration of the COVID-19 declaration under Section 564(b)(1) of the Act, 21 U.S.C. section 360bbb-3(b)(1),  unless the authorization is terminated or revoked.  Performed at [facility], [place] [address] [place], [place] [address]   MRSA Next Gen by PCR, Nasal     Status: Abnormal   Collection Time: 03/28/2022  7:29 AM   Specimen: Nasal Mucosa; Nasal Swab  Result Value Ref Range Status   MRSA by PCR Next Gen DETECTED (A) NOT DETECTED Final    Comment: RESULT CALLED TO, READ BACK BY AND VERIFIED WITH: P. PULLIAM RN, AT [id number] 03/25/2022 D. VANHOOK (NOTE) The GeneXpert MRSA Assay (FDA approved for NASAL specimens only), is one component of a comprehensive MRSA colonization surveillance program. It is not intended to diagnose MRSA infection nor to guide or monitor treatment for MRSA infections. Test  performance is not FDA approved in patients less than 33 years old. Performed at [facility], [place] [address] [place], [place] [address]   Blood Culture (routine x 2)     Status: None (Preliminary result)   Collection Time: 03/24/2022  7:45 AM   Specimen: BLOOD LEFT FOREARM  Result Value Ref Range Status   Specimen Description BLOOD LEFT FOREARM  Final   Special Requests   Final    BOTTLES DRAWN AEROBIC AND ANAEROBIC Blood Culture results may not be optimal due to an inadequate volume of blood received in culture bottles   Culture  Setup Time   Final    GRAM NEGATIVE RODS ANAEROBIC BOTTLE ONLY Organism ID to follow Performed at [facility], [place] [address], [place], [place] [address]    Culture PENDING  Incomplete   Report Status PENDING  Incomplete  Blood Culture (routine x 2)     Status: None (Preliminary result)   Collection Time: 03/20/2022  8:01 AM   Specimen: BLOOD RIGHT FOREARM  Result Value Ref Range Status   Specimen Description BLOOD RIGHT FOREARM  Final   Special Requests   Final    BOTTLES DRAWN AEROBIC AND ANAEROBIC Blood Culture results may not be optimal due to an inadequate volume of blood received in culture bottles   Culture  Setup Time   Final    GRAM NEGATIVE RODS ANAEROBIC BOTTLE ONLY CRITICAL VALUE NOTED.  VALUE IS CONSISTENT WITH PREVIOUSLY REPORTED AND CALLED VALUE. Performed at [facility], [place] [address], [place], [place] [address]    Culture GRAM NEGATIVE RODS  Final   Report Status PENDING  Incomplete         Radiology Studies: CT Head Wo Contrast  Result Date: 03/25/2022 CLINICAL DATA:  Altered mental status, unknown cause. EXAM: CT HEAD WITHOUT CONTRAST TECHNIQUE: Contiguous axial images were obtained from the base of the skull through the vertex without intravenous contrast. RADIATION DOSE REDUCTION: This exam was performed according to the departmental dose-optimization program which includes automated exposure control,  adjustment of the mA and/or kV according to patient size and/or use of iterative reconstruction technique. COMPARISON:  Head CT 10/02/2021. FINDINGS: Brain: No acute intracranial hemorrhage. 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 Old lacunar infarcts in the bilateral thalami and left corona radiata. No hydrocephalus or extra-axial collection. Vascular: No hyperdense vessel or unexpected calcification. Skull: No calvarial fracture or suspicious bone lesion. Skull base is unremarkable. Sinuses/Orbits: Trace fluid in the right sphenoid and left maxillary sinuses. Mastoids are well aerated. Orbits are unremarkable. Other: None. IMPRESSION: 1. No acute intracranial hemorrhage. 2. Unchanged severe bilateral chronic small-vessel  disease with old infarcts as described above. Electronically Signed   By: Emmit Alexanders M.D.   On: 03/28/2022 08:47   DG Chest Port 1 View  Result Date: 04/01/2022 CLINICAL DATA:  Possible sepsis EXAM: PORTABLE CHEST 1 VIEW COMPARISON:  CXR 03/16/21, CXR 10/02/21 FINDINGS: Redemonstrated is a moderate right-sided pleural effusion which is new compared to 03/16/2021, and unchanged compared to 10/02/2021. No pneumothorax. Redemonstrated are patchy airspace opacities in the bilateral lungs, with likely at least slight interval progression the right lung apex. Cardiac and mediastinal contours are unchanged. No radiographically displaced rib fractures. Visualized upper abdomen is unremarkable. IMPRESSION: 1. Redemonstrated moderate right-sided pleural effusion which is new compared to 03/16/2021, and unchanged compared to 10/02/2021. 2. Redemonstrated are patchy airspace opacities in the bilateral lungs, with likely at least slight interval progression at the right lung apex. Electronically Signed   By: Marin Roberts M.D.   On: 03/28/2022 08:00        Scheduled Meds:   antiseptic oral rinse  15 mL Topical BID    HYDROmorphone (DILAUDID) injection  1 mg Intravenous Q4H     LOS: 1 day    Time spent: 35 minutes    Charlei Ramsaran Darleen Crocker, DO [facility]  If 7PM-7AM, please contact night-coverage [email or web address] 03/06/2022, 6:31 AM

## 2022-03-07 DIAGNOSIS — Z8659 Personal history of other mental and behavioral disorders: Secondary | ICD-10-CM | POA: Diagnosis not present

## 2022-03-07 DIAGNOSIS — J189 Pneumonia, unspecified organism: Secondary | ICD-10-CM | POA: Diagnosis not present

## 2022-03-07 DIAGNOSIS — Z515 Encounter for palliative care: Secondary | ICD-10-CM | POA: Diagnosis not present

## 2022-03-07 DIAGNOSIS — A419 Sepsis, unspecified organism: Secondary | ICD-10-CM | POA: Diagnosis not present

## 2022-03-07 MED ORDER — SCOPOLAMINE 1 MG/3DAYS TD PT72
1.0000 | MEDICATED_PATCH | TRANSDERMAL | Status: DC
  Administered 2022-03-07: 1.5 mg via TRANSDERMAL
  Filled 2022-03-07: qty 1

## 2022-03-07 MED ORDER — GLYCOPYRROLATE 0.2 MG/ML IJ SOLN
0.6000 mg | Freq: Once | INTRAMUSCULAR | Status: AC
  Administered 2022-03-07: 0.6 mg via INTRAVENOUS
  Filled 2022-03-07: qty 3

## 2022-03-08 LAB — CULTURE, BLOOD (ROUTINE X 2)

## 2022-03-10 DIAGNOSIS — E119 Type 2 diabetes mellitus without complications: Secondary | ICD-10-CM

## 2022-03-10 DIAGNOSIS — A419 Sepsis, unspecified organism: Secondary | ICD-10-CM | POA: Insufficient documentation

## 2022-03-10 DIAGNOSIS — B964 Proteus (mirabilis) (morganii) as the cause of diseases classified elsewhere: Secondary | ICD-10-CM | POA: Insufficient documentation

## 2022-03-10 DIAGNOSIS — I482 Chronic atrial fibrillation, unspecified: Secondary | ICD-10-CM | POA: Insufficient documentation

## 2022-04-02 NOTE — Progress Notes (Signed)
PROGRESS NOTE    Jason [name]  [id number] DOB: 07/17/1945 DOA: 03/06/2022 PCP: Janifer Adie, MD (Inactive)  Chief Complaint  Patient presents with   SOB   AMS    Brief Narrative:   Jason [name] is Jason [name] 77 y.o. male with medical history significant for afib (not on [facility]), bladder cancer, diabetes who presented with SOB and AMS from his facility.  Staff reports that he was tachypneic with sat's in the 51% range.  Patient was started on IV fluids and IV antibiotics due to sepsis secondary to pneumonia with moderate right-sided pleural effusion.  Legal guardian was contacted who opted for comfort measures.  Patient did have significant urinary retention with noted dense bulbar urethral stricture and history of bladder cancer and urology could not place catheter.  Hold off on suprapubic catheter placement and agree with palliative to continue comfort measures with additional pain medications as ordered.   Assessment & Plan:   Principal Problem:   Comfort measures only status  Acute hypoxemic respiratory failure secondary to sepsis, present on admission secondary to pneumonia with associated right sided pleural effusion -Now with Proteus bacteremia, holding antibiotics as patient is comfort care   Acute urinary retention in the setting of bladder cancer and bulbar urethral stricture -Hold off on suprapubic catheter and agree with palliative medicine regarding need for further pain management.   History of atrial fibrillation   History of diabetes   Patient currently on comfort measures with anticipated in-hospital death.    DVT prophylaxis: none with comfort measures Code Status: DNR Family Communication: none Disposition:   Status is: Inpatient Remains inpatient appropriate because: anticipate in hospital death   Consultants:  Palliative care urology  Procedures:  none  Antimicrobials:  Anti-infectives (From admission, onward)    Start     Dose/Rate Route Frequency  Ordered Stop   03/06/22 0800  meropenem (MERREM) 1 g in sodium chloride 0.9 % 100 mL IVPB  Status:  Discontinued        1 g 200 mL/hr over 30 Minutes Intravenous Every 24 hours 03/24/2022 0914 03/30/2022 1120   03/18/2022 0908  vancomycin variable dose per unstable renal function (pharmacist dosing)  Status:  Discontinued         Does not apply See admin instructions 03/10/2022 0908 03/25/2022 1120   03/18/2022 0730  vancomycin (VANCOCIN) IVPB 1000 mg/200 mL premix        1,000 mg 200 mL/hr over 60 Minutes Intravenous  Once 03/09/2022 0723 03/16/2022 0953   03/31/2022 0730  meropenem (MERREM) 1 g in sodium chloride 0.9 % 100 mL IVPB        1 g 200 mL/hr over 30 Minutes Intravenous  Once 03/12/2022 0723 03/27/2022 0845       Subjective: unresponsive  Objective: Vitals:   03/06/22 0752 03/06/22 2124 2022/03/24 0427 2022/03/24 0735  BP: 112/65 (!) 104/59 (!) 95/58 99/62  Pulse: (!) 124 (!) 128 (!) 122 (!) 200  Resp:  '17 12 19  '$ Temp: 99.2 F (37.3 C) (!) 101.2 F (38.4 C) (!) 102.3 F (39.1 C) 99.9 F (37.7 C)  TempSrc: Oral Oral Oral   SpO2: 94% 95% 96% 96%    Intake/Output Summary (Last 24 hours) at March 24, 2022 1515 Last data filed at Mar 24, 2022 0505 Gross per 24 hour  Intake 7.74 ml  Output --  Net 7.74 ml   There were no vitals filed for this visit.  Examination:  General exam: Appears calm and comfortable  Respiratory system: steady Central nervous  system: unresponsive    Data Reviewed: I have personally reviewed following labs and imaging studies  CBC: Recent Labs  Lab 03/21/2022 0808  WBC 38.6*  NEUTROABS 38.2*  HGB 12.9*  HCT 41.3  MCV 64.6*  PLT [id number]    Basic Metabolic Panel: Recent Labs  Lab 03/11/2022 0808  NA 140  K 4.1  CL 103  CO2 16*  GLUCOSE 156*  BUN 72*  CREATININE 4.62*  CALCIUM 9.1    GFR: CrCl cannot be calculated (Unknown ideal weight.).  Liver Function Tests: Recent Labs  Lab 03/30/2022 0808  AST 30  ALT 16  ALKPHOS 155*  BILITOT 1.6*  PROT 8.8*   ALBUMIN 2.6*    CBG: No results for input(s): "GLUCAP" in the last 168 hours.   Recent Results (from the past 240 hour(s))  Resp panel by RT-PCR (RSV, Flu Myca Perno&B, Covid)     Status: None   Collection Time: 03/25/2022  7:21 AM   Specimen: Nasal Swab  Result Value Ref Range Status   SARS Coronavirus 2 by RT PCR NEGATIVE NEGATIVE Final   Influenza Brandilynn Taormina by PCR NEGATIVE NEGATIVE Final   Influenza B by PCR NEGATIVE NEGATIVE Final    Comment: (NOTE) The Xpert Xpress SARS-CoV-2/FLU/RSV plus assay is intended as an aid in the diagnosis of influenza from Nasopharyngeal swab specimens and should not be used as [place] sole basis for treatment. Nasal washings and aspirates are unacceptable for Xpert Xpress SARS-CoV-2/FLU/RSV testing.  Fact Sheet for Patients: EntrepreneurPulse.com.au  Fact Sheet for Healthcare Providers: IncredibleEmployment.be  This test is not yet approved or cleared by the [place] FDA and has been authorized for detection and/or diagnosis of SARS-CoV-2 by FDA under an Emergency Use Authorization (EUA). This EUA will remain in effect (meaning this test can be used) for the duration of the COVID-19 declaration under Section 564(b)(1) of the Act, 21 U.S.C. section 360bbb-3(b)(1), unless the authorization is terminated or revoked.     Resp Syncytial Virus by PCR NEGATIVE NEGATIVE Final    Comment: (NOTE) Fact Sheet for Patients: EntrepreneurPulse.com.au  Fact Sheet for Healthcare Providers: IncredibleEmployment.be  This test is not yet approved or cleared by the [place] FDA and has been authorized for detection and/or diagnosis of SARS-CoV-2 by FDA under an Emergency Use Authorization (EUA). This EUA will remain in effect (meaning this test can be used) for the duration of the COVID-19 declaration under Section 564(b)(1) of the Act, 21 U.S.C. section 360bbb-3(b)(1), unless the authorization is  terminated or revoked.  Performed at [facility], [place] [address], [place], [place] [address]   MRSA Next Gen by PCR, Nasal     Status: Abnormal   Collection Time: 03/16/2022  7:29 AM   Specimen: Nasal Mucosa; Nasal Swab  Result Value Ref Range Status   MRSA by PCR Next Gen DETECTED (Meriem Lemieux) NOT DETECTED Final    Comment: RESULT CALLED TO, READ BACK BY AND VERIFIED WITH: P. PULLIAM RN, AT [id number] 03/18/2022 D. VANHOOK (NOTE) The GeneXpert MRSA Assay (FDA approved for NASAL specimens only), is one component of Kalla Watson comprehensive MRSA colonization surveillance program. It is not intended to diagnose MRSA infection nor to guide or monitor treatment for MRSA infections. Test performance is not FDA approved in patients less than 58 years old. Performed at [facility], [place] [address] [place], [place] [address]   Blood Culture (routine x 2)     Status: Abnormal (Preliminary result)   Collection Time: 03/08/2022  7:45 AM  Specimen: BLOOD LEFT FOREARM  Result Value Ref Range Status   Specimen Description BLOOD LEFT FOREARM  Final   Special Requests   Final    BOTTLES DRAWN AEROBIC AND ANAEROBIC Blood Culture results may not be optimal due to an inadequate volume of blood received in culture bottles   Culture  Setup Time   Final    GRAM NEGATIVE RODS IN BOTH AEROBIC AND ANAEROBIC BOTTLES CRITICAL RESULT CALLED TO, READ BACK BY AND VERIFIED WITH: VBRYK,PHARMD'@0650'$  03/06/22 [place]    Culture (Skye Rodarte)  Final    PROTEUS MIRABILIS SUSCEPTIBILITIES TO FOLLOW Performed at [facility], [address] [place], [place] [address]    Report Status PENDING  Incomplete  Blood Culture ID Panel (Reflexed)     Status: Abnormal   Collection Time: 03/14/2022  7:45 AM  Result Value Ref Range Status   Enterococcus faecalis NOT DETECTED NOT DETECTED Final   Enterococcus Faecium NOT DETECTED NOT DETECTED Final   Listeria monocytogenes NOT DETECTED NOT DETECTED Final   Staphylococcus species NOT DETECTED NOT  DETECTED Final   Staphylococcus aureus (BCID) NOT DETECTED NOT DETECTED Final   Staphylococcus epidermidis NOT DETECTED NOT DETECTED Final   Staphylococcus lugdunensis NOT DETECTED NOT DETECTED Final   Streptococcus species NOT DETECTED NOT DETECTED Final   Streptococcus agalactiae NOT DETECTED NOT DETECTED Final   Streptococcus pneumoniae NOT DETECTED NOT DETECTED Final   Streptococcus pyogenes NOT DETECTED NOT DETECTED Final   Trudie Cervantes.calcoaceticus-baumannii NOT DETECTED NOT DETECTED Final   Bacteroides fragilis NOT DETECTED NOT DETECTED Final   Enterobacterales DETECTED (Calynn Ferrero) NOT DETECTED Final    Comment: Enterobacterales represent Aaylah Pokorny large order of gram negative bacteria, not Abad Manard single organism. CRITICAL RESULT CALLED TO, READ BACK BY AND VERIFIED WITH: VBRYK,PHARMD'@0649'$  03/06/22 [place]    Enterobacter cloacae complex NOT DETECTED NOT DETECTED Final   Escherichia coli NOT DETECTED NOT DETECTED Final   Klebsiella aerogenes NOT DETECTED NOT DETECTED Final   Klebsiella oxytoca NOT DETECTED NOT DETECTED Final   Klebsiella pneumoniae NOT DETECTED NOT DETECTED Final   Proteus species DETECTED (Acel Natzke) NOT DETECTED Final    Comment: CRITICAL RESULT CALLED TO, READ BACK BY AND VERIFIED WITH: V BRYK,PHARMD'@0651'$  03/06/22 [place]    Salmonella species NOT DETECTED NOT DETECTED Final   Serratia marcescens NOT DETECTED NOT DETECTED Final   Haemophilus influenzae NOT DETECTED NOT DETECTED Final   Neisseria meningitidis NOT DETECTED NOT DETECTED Final   Pseudomonas aeruginosa NOT DETECTED NOT DETECTED Final   Stenotrophomonas maltophilia NOT DETECTED NOT DETECTED Final   Candida albicans NOT DETECTED NOT DETECTED Final   Candida auris NOT DETECTED NOT DETECTED Final   Candida glabrata NOT DETECTED NOT DETECTED Final   Candida krusei NOT DETECTED NOT DETECTED Final   Candida parapsilosis NOT DETECTED NOT DETECTED Final   Candida tropicalis NOT DETECTED NOT DETECTED Final   Cryptococcus neoformans/gattii NOT  DETECTED NOT DETECTED Final   CTX-M ESBL NOT DETECTED NOT DETECTED Final   Carbapenem resistance IMP NOT DETECTED NOT DETECTED Final   Carbapenem resistance KPC NOT DETECTED NOT DETECTED Final   Carbapenem resistance NDM NOT DETECTED NOT DETECTED Final   Carbapenem resist OXA 48 LIKE NOT DETECTED NOT DETECTED Final   Carbapenem resistance VIM NOT DETECTED NOT DETECTED Final    Comment: Performed at [facility], [address], [place], [place] [address]  Blood Culture (routine x 2)     Status: Abnormal (Preliminary result)   Collection Time: 03/10/2022  8:01 AM   Specimen:  BLOOD RIGHT FOREARM  Result Value Ref Range Status   Specimen Description BLOOD RIGHT FOREARM  Final   Special Requests   Final    BOTTLES DRAWN AEROBIC AND ANAEROBIC Blood Culture results may not be optimal due to an inadequate volume of blood received in culture bottles   Culture  Setup Time   Final    GRAM NEGATIVE RODS ANAEROBIC BOTTLE ONLY CRITICAL VALUE NOTED.  VALUE IS CONSISTENT WITH PREVIOUSLY REPORTED AND CALLED VALUE. Performed at [facility], [place] [address], [place], [place] [address]    Culture PROTEUS MIRABILIS (Azael Ragain)  Final   Report Status PENDING  Incomplete         Radiology Studies: No results found.      Scheduled Meds:  antiseptic oral rinse  15 mL Topical BID   glycopyrrolate  0.4 mg Intravenous Q4H   scopolamine  1 patch Transdermal Q72H   Continuous Infusions:  HYDROmorphone 0.5 mg/hr (03/06/22 1237)     LOS: 2 days    Time spent: over 30 min    Fayrene Helper, MD [facility]   To contact the attending provider between 7A-7P or the covering provider during after hours 7P-7A, please log into the web site [email or web address] and access using universal  password for that web site. If you do not have the password, please call the hospital operator.  03/20/2022, 3:15 PM

## 2022-04-02 NOTE — Progress Notes (Signed)
Daily Progress Note   Patient Name: Jason [name]       Date: 03/23/22 DOB: 1945/04/07  Age: 77 y.o. MRN#: [id number] Attending Physician: Elodia Florence., * Primary Care Physician: Janifer Adie, MD (Inactive) Admit Date: 03/26/2022  Reason for Consultation/Follow-up: Non pain symptom management, Pain control, Psychosocial/spiritual support, and Terminal Care  Subjective: I have reviewed medical records including EPIC notes and labs. Received report from primary RN - no acute concerns. RN reports oxygen was not removed yesterday - discussed weaning to room air and managing any dyspnea, increased work of breathing, tachypnea with comfort medications as ordered.   Went to visit patient at bedside - no family/visitors present. Patient was lying in bed unresponsive. No signs or non-verbal gestures of pain or discomfort noted. No respiratory distress or increased work of breathing; secretions and tachypnea noted. Secretions are improved from yesterday; however, still remain audible without stethoscope. RR 28.  Provided '1mg'$  bolus dose of dilaudid. Increased basal rate to '1mg'$ /hr. Removed oxygen.   Informed RN of changes.   Length of Stay: 2  Current Medications: Scheduled Meds:   antiseptic oral rinse  15 mL Topical BID   glycopyrrolate  0.4 mg Intravenous Q4H    Continuous Infusions:  HYDROmorphone 0.5 mg/hr (03/06/22 1237)    PRN Meds: acetaminophen **OR** acetaminophen, diphenhydrAMINE, glycopyrrolate **OR** glycopyrrolate **OR** glycopyrrolate, haloperidol **OR** haloperidol **OR** haloperidol lactate, HYDROmorphone, LORazepam **OR** LORazepam, ondansetron **OR** ondansetron (ZOFRAN) IV, polyvinyl alcohol  Physical Exam Vitals and nursing note reviewed.  Constitutional:       General: He is not in acute distress.    Appearance: He is ill-appearing.  Pulmonary:     Effort: No respiratory distress.     Comments: Respiratory secretions Skin:    General: Skin is warm and dry.  Neurological:     Mental Status: He is unresponsive.     Motor: Weakness present.  Psychiatric:        Speech: He is noncommunicative.             Vital Signs: BP 99/62 (BP Location: Right Arm)   Pulse (!) 200   Temp 99.9 F (37.7 C)   Resp 19   SpO2 96%  SpO2: SpO2: 96 % O2 Device: O2 Device: Nasal Cannula O2 Flow Rate: O2 Flow Rate (L/min): 2 L/min  Intake/output  summary:  Intake/Output Summary (Last 24 hours) at 03-31-2022 0916 Last data filed at 31-Mar-2022 0505 Gross per 24 hour  Intake 7.74 ml  Output --  Net 7.74 ml   LBM: Last BM Date : 03/26/2022 Baseline Weight:   Most recent weight:         Palliative Assessment/Data: PPS 10%      Patient Active Problem List   Diagnosis Date Noted   Hypomagnesemia 10/03/2021   Hypophosphatemia 10/03/2021   Subclinical hyperthyroidism 10/03/2021   Advanced care planning/counseling discussion    Goals of care, counseling/discussion    Severe sepsis ([place]) 0000000   Acute metabolic encephalopathy 0000000   UTI (urinary tract infection) 10/02/2021   DM2 (diabetes mellitus, type 2) ([place]) 10/02/2021   Palliative care status 03/17/2021   Comfort measures only status 03/17/2021   Anemia 03/17/2021   Atrial fibrillation, transient ([place]) 03/17/2021   Aspiration into respiratory tract    Malnutrition of moderate degree 03/03/2021   Incarcerated hernia    Pressure injury of skin 01/30/2018   Small bowel obstruction ([place]) 01/27/2018   Inguinal hernia 01/27/2018   Acute esophagitis 01/27/2018   Hematemesis 01/27/2018   Pneumonia 12/15/2015   Acute encephalopathy 03/07/2013   Postoperative intra-abdominal abscess 03/07/2013   Hydronephrosis, bilateral 03/07/2013   Pressure ulcer, stage 3 ([place]) 03/07/2013   Septic shock  ([place]) 02/18/2013   Aspiration pneumonia ([place]) 02/18/2013   Acute hypoxemic respiratory failure ([place]) 02/18/2013   AKI (acute kidney injury) ([place]) 02/18/2013   Dementia ([place]) 02/18/2013   Hematuria 02/18/2013   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 male  with past medical history of afib (not on [facility]), bladder cancer, diabetes, dementia, stroke, COPD presented to ED on 03/24/2022 from [facility] with concerns of shortness of breath and AMS. Patient was evaluated in ED and found to be severely ill with sepsis from a pneumonia /moderate R pleural effusion. After EDP spoke with legal guardian, goals were changed to comfort measures only status and PMT was consulted to assist with symptom management. Patient  admitted on 03/19/2022 for end of life care.    Assessment: Principal Problem:   Comfort measures only status   Recommendations/Plan: Continue full comfort measures Continue DNR/DNI as previously documented Patient is likely not stable for transfer at this time - anticipate hospital death Weaned to room air today Comfort focused medication regimen adjusted as noted below Would hold off on suprapubic catheter placement at this time Nursing to provide frequent assessments and administer PRN medications as clinically necessary to ensure EOL comfort PMT will continue to follow and support holistically  Symptom Management Continuous dilaudid infusion basal rate increased; PRN doses for breakthrough symptoms One time dose of 0.'6mg'$  robinul now. Added scopolamine patch. Continue scheduled robinul q4h; PRN doses for breakthrough symptoms Tylenol PRN  pain/fever Biotin twice daily Benadryl PRN itching Haldol PRN agitation/delirium Ativan PRN anxiety/seizure/sleep/distress Zofran PRN nausea/vomiting Liquifilm Tears PRN dry eye  Goals of Care and Additional Recommendations: Limitations on Scope of Treatment: Full Comfort Care  Code Status:    Code Status Orders  (From admission, onward)           Start  Ordered   03/29/2022 1152  Do not attempt resuscitation (DNR)  Continuous       Question Answer Comment  If patient has no pulse and is not breathing Do Not Attempt Resuscitation   If patient has a pulse and/or is breathing: Medical Treatment Goals COMFORT MEASURES: Keep clean/warm/dry, use medication by any route; positioning, wound care and other measures to relieve pain/suffering; use oxygen, suction/manual treatment of airway obstruction for comfort; do not transfer unless for comfort needs.   Consent: Discussion documented in EHR or advanced directives reviewed      04/01/2022 1153           Code Status History     Date Active Date Inactive Code Status Order ID Comments User Context   03/03/2022 0723 03/17/2022 1153 DNR [id number]  Fransico Meadow, MD ED   10/04/2021 1229 10/04/2021 2157 DNR [id number]  Earlie Counts, NP Inpatient   10/03/2021 0335 10/04/2021 1229 DNR [id number]  Toy Baker, MD ED   03/17/2021 1505 03/20/2021 2229 DNR [id number]  Philis Pique, NP Inpatient   03/17/2021 1455 03/17/2021 1505 DNR [id number]  Gaylan Gerold, DO Inpatient   03/02/2021 0649 03/17/2021 1455 Full Code [id number]  Corey Harold, NP ED   01/27/2018 0720 01/31/2018 1458 Full Code [id number]  Valinda Party, DO Inpatient   12/13/2015 1701 12/15/2015 2340 Full Code [id number]  Juliet Rude, MD Inpatient   03/07/2013 1444 03/11/2013 1650 Full Code [id number]  Azzie Roup, MD Inpatient   03/07/2013 0519 03/07/2013 1444 Full Code [id number]  Theressa Millard, MD Inpatient   02/18/2013 1014 03/02/2013 2036 Full Code [id number]   Samuella Cota, MD Inpatient      Advance Directive Documentation    Flowsheet Row Most Recent Value  Type of Advance Directive Out of facility DNR (pink MOST or yellow form)  Pre-existing out of facility DNR order (yellow form or pink MOST form) Pink Most/Yellow Form available - Physician notified to receive inpatient order  "MOST" Form in Place? --       Prognosis:  Hours - Days  Discharge Planning: Anticipated Hospital Death  Care plan was discussed with primary RN and LPN  Thank you for allowing the Palliative Medicine Team to assist in the care of this patient.  Lin Landsman, NP  Please contact Palliative Medicine Team phone at [phone] for questions and concerns.   *Portions of this note are a verbal dictation therefore any spelling and/or grammatical errors are due to the "[place] [employer]" system interpretation.

## 2022-04-02 NOTE — Death Summary Note (Signed)
DEATH SUMMARY   Patient Details  Name: Jason [name] MRN: [id number] DOB: Jun 08, 1945 [id number], Jason Punt, MD (Inactive) Admission/Discharge Information   Admit Date:  20-Mar-2022  Date of Death: Date of Death: March 22, 2022  Time of Death: Time of Death: 1519/04/03  Length of Stay: 2   Principle Cause of death: proteus bacteremia, pneumonia  Hospital Diagnoses: Principal Problem:   Comfort measures only status   Hospital Course:  Jason [name] is Jason [name] 77 y.o. male with medical history significant for afib (not on [facility]), bladder cancer, diabetes who presented with SOB and AMS from his facility.  Staff reports that he was tachypneic with sat's in the 51% range.  Patient was started on IV fluids and IV antibiotics due to sepsis secondary to pneumonia with moderate right-sided pleural effusion.  Legal guardian was contacted who opted for comfort measures.  Patient did have significant urinary retention with noted dense bulbar urethral stricture and history of bladder cancer and urology could not place catheter.  Hold off on suprapubic catheter placement and agree with palliative to continue comfort measures with additional pain medications as ordered.    He passed away 03-22-22.  Discussed with his guardian and daughter.  Assessment and Plan:  See prior notes for additional details      The results of significant diagnostics from this hospitalization (including imaging, microbiology, ancillary and laboratory) are listed below for reference.   Significant Diagnostic Studies: CT Head Wo Contrast  Result Date: 03-20-2022 CLINICAL DATA:  Altered mental status, unknown cause. EXAM: CT HEAD WITHOUT CONTRAST TECHNIQUE: Contiguous axial images were obtained from the base of the skull through the vertex without intravenous contrast. RADIATION DOSE REDUCTION: This exam was performed according to the departmental dose-optimization program which includes automated exposure control, adjustment of the mA and/or kV  according to patient size and/or use of iterative reconstruction technique. COMPARISON:  Head CT 10/02/2021. FINDINGS: Brain: No acute intracranial hemorrhage. 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 Old lacunar infarcts in the bilateral thalami and left corona radiata. No hydrocephalus or extra-axial collection. Vascular: No hyperdense vessel or unexpected calcification. Skull: No calvarial fracture or suspicious bone lesion. Skull base is unremarkable. Sinuses/Orbits: Trace fluid in the right sphenoid and left maxillary sinuses. Mastoids are well aerated. Orbits are unremarkable. Other: None. IMPRESSION: 1. No acute intracranial hemorrhage. 2. 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 Electronically Signed   By: Emmit Alexanders M.D.   On: March 20, 2022 08:47   DG Chest Port 1 View  Result Date: 2022-03-20 CLINICAL DATA:  Possible sepsis EXAM: PORTABLE CHEST 1 VIEW COMPARISON:  CXR 03/16/21, CXR 10/02/21 FINDINGS: Redemonstrated is Reyann Troop moderate right-sided pleural effusion which is new compared to 03/16/2021, and unchanged compared to 10/02/2021. No pneumothorax. Redemonstrated are patchy airspace opacities in the bilateral lungs, with likely at least slight interval progression the right lung apex. Cardiac and mediastinal contours are unchanged. No radiographically displaced rib fractures. Visualized upper abdomen is unremarkable. IMPRESSION: 1. Redemonstrated moderate right-sided pleural effusion which is new compared to 03/16/2021, and unchanged compared to 10/02/2021. 2. Redemonstrated are patchy airspace opacities in the bilateral lungs, with likely at least slight interval progression at the right lung apex. Electronically Signed   By: Marin Roberts M.D.   On: 2022-03-20 08:00    Microbiology: Recent Results (from the past 240 hour(s))  Resp  panel by RT-PCR (RSV, Flu Rachel Rison&B, Covid)  Status: None   Collection Time: 03/16/2022  7:21 AM   Specimen: Nasal Swab  Result Value Ref Range Status   SARS Coronavirus 2 by RT PCR NEGATIVE NEGATIVE Final   Influenza Viki Carrera by PCR NEGATIVE NEGATIVE Final   Influenza B by PCR NEGATIVE NEGATIVE Final    Comment: (NOTE) The Xpert Xpress SARS-CoV-2/FLU/RSV plus assay is intended as an aid in the diagnosis of influenza from Nasopharyngeal swab specimens and should not be used as Baylon Santelli sole basis for treatment. Nasal washings and aspirates are unacceptable for Xpert Xpress SARS-CoV-2/FLU/RSV testing.  Fact Sheet for Patients: EntrepreneurPulse.com.au  Fact Sheet for Healthcare Providers: IncredibleEmployment.be  This test is not yet approved or cleared by the [place] FDA and has been authorized for detection and/or diagnosis of SARS-CoV-2 by FDA under an Emergency Use Authorization (EUA). This EUA will remain in effect (meaning this test can be used) for the duration of the COVID-19 declaration under Section 564(b)(1) of the Act, 21 U.S.C. section 360bbb-3(b)(1), unless the authorization is terminated or revoked.     Resp Syncytial Virus by PCR NEGATIVE NEGATIVE Final    Comment: (NOTE) Fact Sheet for Patients: EntrepreneurPulse.com.au  Fact Sheet for Healthcare Providers: IncredibleEmployment.be  This test is not yet approved or cleared by the [place] FDA and has been authorized for detection and/or diagnosis of SARS-CoV-2 by FDA under an Emergency Use Authorization (EUA). This EUA will remain in effect (meaning this test can be used) for the duration of the COVID-19 declaration under Section 564(b)(1) of the Act, 21 U.S.C. section 360bbb-3(b)(1), unless the authorization is terminated or revoked.  Performed at [facility], [place] [address], [place], [place] [address]   MRSA Next Gen by PCR, Nasal      Status: Abnormal   Collection Time: 03/25/2022  7:29 AM   Specimen: Nasal Mucosa; Nasal Swab  Result Value Ref Range Status   MRSA by PCR Next Gen DETECTED (Vince Ainsley) NOT DETECTED Final    Comment: RESULT CALLED TO, READ BACK BY AND VERIFIED WITH: P. PULLIAM RN, AT [id number] 03/22/2022 D. VANHOOK (NOTE) The GeneXpert MRSA Assay (FDA approved for NASAL specimens only), is one component of [place] comprehensive MRSA colonization surveillance program. It is not intended to diagnose MRSA infection nor to guide or monitor treatment for MRSA infections. Test performance is not FDA approved in patients less than 60 years old. Performed at [facility], [place] [address], [place],  [address]   Blood Culture (routine x 2)     Status: Abnormal   Collection Time: 03/27/2022  7:45 AM   Specimen: BLOOD LEFT FOREARM  Result Value Ref Range Status   Specimen Description BLOOD LEFT FOREARM  Final   Special Requests   Final    BOTTLES DRAWN AEROBIC AND ANAEROBIC Blood Culture results may not be optimal due to an inadequate volume of blood received in culture bottles   Culture  Setup Time   Final    GRAM NEGATIVE RODS IN BOTH AEROBIC AND ANAEROBIC BOTTLES CRITICAL RESULT CALLED TO, READ BACK BY AND VERIFIED WITH: VBRYK,PHARMD'@0650'$  03/06/22 [place] Performed at [facility], [place] [address], [place],  [address]    Culture PROTEUS MIRABILIS (Hortencia Martire)  Final   Report Status 03/08/2022 FINAL  Final   Organism ID, Bacteria PROTEUS MIRABILIS  Final      Susceptibility   Proteus mirabilis - MIC*    AMPICILLIN <=2 SENSITIVE Sensitive     CEFEPIME <=0.12 SENSITIVE Sensitive  CEFTAZIDIME <=1 SENSITIVE Sensitive     CEFTRIAXONE <=0.25 SENSITIVE Sensitive     CIPROFLOXACIN <=0.25 SENSITIVE Sensitive     GENTAMICIN 2 SENSITIVE Sensitive     IMIPENEM 2 SENSITIVE Sensitive     TRIMETH/SULFA <=20 SENSITIVE Sensitive     AMPICILLIN/SULBACTAM <=2 SENSITIVE Sensitive     PIP/TAZO <=4 SENSITIVE Sensitive     * PROTEUS  MIRABILIS  Blood Culture ID Panel (Reflexed)     Status: Abnormal   Collection Time: 03/02/2022  7:45 AM  Result Value Ref Range Status   Enterococcus faecalis NOT DETECTED NOT DETECTED Final   Enterococcus Faecium NOT DETECTED NOT DETECTED Final   Listeria monocytogenes NOT DETECTED NOT DETECTED Final   Staphylococcus species NOT DETECTED NOT DETECTED Final   Staphylococcus aureus (BCID) NOT DETECTED NOT DETECTED Final   Staphylococcus epidermidis NOT DETECTED NOT DETECTED Final   Staphylococcus lugdunensis NOT DETECTED NOT DETECTED Final   Streptococcus species NOT DETECTED NOT DETECTED Final   Streptococcus agalactiae NOT DETECTED NOT DETECTED Final   Streptococcus pneumoniae NOT DETECTED NOT DETECTED Final   Streptococcus pyogenes NOT DETECTED NOT DETECTED Final   Jigar Zielke.calcoaceticus-baumannii NOT DETECTED NOT DETECTED Final   Bacteroides fragilis NOT DETECTED NOT DETECTED Final   Enterobacterales DETECTED (Genella Bas) NOT DETECTED Final    Comment: Enterobacterales represent Madelyne Millikan large order of gram negative bacteria, not Jshon Ibe single organism. CRITICAL RESULT CALLED TO, READ BACK BY AND VERIFIED WITH: VBRYK,PHARMD'@0649'$  03/06/22 [place]    Enterobacter cloacae complex NOT DETECTED NOT DETECTED Final   Escherichia coli NOT DETECTED NOT DETECTED Final   Klebsiella aerogenes NOT DETECTED NOT DETECTED Final   Klebsiella oxytoca NOT DETECTED NOT DETECTED Final   Klebsiella pneumoniae NOT DETECTED NOT DETECTED Final   Proteus species DETECTED ([place]) NOT DETECTED Final    Comment: CRITICAL RESULT CALLED TO, READ BACK BY AND VERIFIED WITH: V BRYK,PHARMD'@0651'$  03/06/22 [place]    Salmonella species NOT DETECTED NOT DETECTED Final   Serratia marcescens NOT DETECTED NOT DETECTED Final   Haemophilus influenzae NOT DETECTED NOT DETECTED Final   Neisseria meningitidis NOT DETECTED NOT DETECTED Final   Pseudomonas aeruginosa NOT DETECTED NOT DETECTED Final   Stenotrophomonas maltophilia NOT DETECTED NOT DETECTED Final    Candida albicans NOT DETECTED NOT DETECTED Final   Candida auris NOT DETECTED NOT DETECTED Final   Candida glabrata NOT DETECTED NOT DETECTED Final   Candida krusei NOT DETECTED NOT DETECTED Final   Candida parapsilosis NOT DETECTED NOT DETECTED Final   Candida tropicalis NOT DETECTED NOT DETECTED Final   Cryptococcus neoformans/gattii NOT DETECTED NOT DETECTED Final   CTX-M ESBL NOT DETECTED NOT DETECTED Final   Carbapenem resistance IMP NOT DETECTED NOT DETECTED Final   Carbapenem resistance KPC NOT DETECTED NOT DETECTED Final   Carbapenem resistance NDM NOT DETECTED NOT DETECTED Final   Carbapenem resist OXA 48 LIKE NOT DETECTED NOT DETECTED Final   Carbapenem resistance VIM NOT DETECTED NOT DETECTED Final    Comment: Performed at [facility], [address], [place], [place] [address]  Blood Culture (routine x 2)     Status: Abnormal   Collection Time: 03/19/2022  8:01 AM   Specimen: BLOOD RIGHT FOREARM  Result Value Ref Range Status   Specimen Description BLOOD RIGHT FOREARM  Final   Special Requests   Final    BOTTLES DRAWN AEROBIC AND ANAEROBIC Blood Culture results may not be optimal due to an inadequate volume of blood received in culture bottles   Culture  Setup Time  Final    GRAM NEGATIVE RODS ANAEROBIC BOTTLE ONLY CRITICAL VALUE NOTED.  VALUE IS CONSISTENT WITH PREVIOUSLY REPORTED AND CALLED VALUE.    Culture (Chrystie Hagwood)  Final    PROTEUS MIRABILIS SUSCEPTIBILITIES PERFORMED ON PREVIOUS CULTURE WITHIN THE LAST 5 DAYS. Performed at [facility], [place] [address] [place], [place] [address]    Report Status 03/08/2022 FINAL  Final    Time spent: 40 minutes  Signed: Fayrene Helper, MD 03/10/22

## 2022-04-02 DEATH — deceased

## 2023-12-06 ENCOUNTER — Encounter (HOSPITAL_COMMUNITY): Payer: Self-pay | Admitting: General Surgery
